# Patient Record
Sex: Female | Born: 1965
Health system: Southern US, Community
[De-identification: ages and names within clinical notes are randomized; demographics above are authoritative.]

## PROBLEM LIST (undated history)

## (undated) ENCOUNTER — Emergency Department (HOSPITAL_COMMUNITY): Admission: EM | Payer: Commercial Managed Care - PPO | Source: Home / Self Care

## (undated) DIAGNOSIS — E282 Polycystic ovarian syndrome: Secondary | ICD-10-CM

## (undated) DIAGNOSIS — F4310 Post-traumatic stress disorder, unspecified: Secondary | ICD-10-CM

## (undated) DIAGNOSIS — R609 Edema, unspecified: Secondary | ICD-10-CM

## (undated) DIAGNOSIS — G894 Chronic pain syndrome: Secondary | ICD-10-CM

## (undated) DIAGNOSIS — I7300 Raynaud's syndrome without gangrene: Secondary | ICD-10-CM

## (undated) DIAGNOSIS — G4734 Idiopathic sleep related nonobstructive alveolar hypoventilation: Secondary | ICD-10-CM

## (undated) DIAGNOSIS — R3129 Other microscopic hematuria: Secondary | ICD-10-CM

## (undated) DIAGNOSIS — K219 Gastro-esophageal reflux disease without esophagitis: Secondary | ICD-10-CM

## (undated) DIAGNOSIS — C20 Malignant neoplasm of rectum: Secondary | ICD-10-CM

## (undated) DIAGNOSIS — L299 Pruritus, unspecified: Secondary | ICD-10-CM

## (undated) DIAGNOSIS — F17200 Nicotine dependence, unspecified, uncomplicated: Secondary | ICD-10-CM

## (undated) DIAGNOSIS — F319 Bipolar disorder, unspecified: Secondary | ICD-10-CM

## (undated) DIAGNOSIS — J309 Allergic rhinitis, unspecified: Secondary | ICD-10-CM

## (undated) DIAGNOSIS — F411 Generalized anxiety disorder: Secondary | ICD-10-CM

## (undated) DIAGNOSIS — M797 Fibromyalgia: Secondary | ICD-10-CM

## (undated) DIAGNOSIS — I2510 Atherosclerotic heart disease of native coronary artery without angina pectoris: Secondary | ICD-10-CM

## (undated) DIAGNOSIS — M79662 Pain in left lower leg: Secondary | ICD-10-CM

## (undated) DIAGNOSIS — J411 Mucopurulent chronic bronchitis: Principal | ICD-10-CM

## (undated) DIAGNOSIS — L308 Other specified dermatitis: Secondary | ICD-10-CM

## (undated) DIAGNOSIS — Z8619 Personal history of other infectious and parasitic diseases: Secondary | ICD-10-CM

## (undated) DIAGNOSIS — R600 Localized edema: Secondary | ICD-10-CM

## (undated) DIAGNOSIS — I10 Essential (primary) hypertension: Secondary | ICD-10-CM

## (undated) DIAGNOSIS — M1990 Unspecified osteoarthritis, unspecified site: Secondary | ICD-10-CM

## (undated) DIAGNOSIS — R937 Abnormal findings on diagnostic imaging of other parts of musculoskeletal system: Secondary | ICD-10-CM

## (undated) DIAGNOSIS — F419 Anxiety disorder, unspecified: Secondary | ICD-10-CM

## (undated) DIAGNOSIS — D1803 Hemangioma of intra-abdominal structures: Secondary | ICD-10-CM

## (undated) DIAGNOSIS — K760 Fatty (change of) liver, not elsewhere classified: Secondary | ICD-10-CM

## (undated) DIAGNOSIS — R490 Dysphonia: Secondary | ICD-10-CM

## (undated) DIAGNOSIS — K589 Irritable bowel syndrome without diarrhea: Secondary | ICD-10-CM

## (undated) DIAGNOSIS — E662 Morbid (severe) obesity with alveolar hypoventilation: Secondary | ICD-10-CM

## (undated) DIAGNOSIS — J329 Chronic sinusitis, unspecified: Secondary | ICD-10-CM

## (undated) DIAGNOSIS — F329 Major depressive disorder, single episode, unspecified: Secondary | ICD-10-CM

## (undated) DIAGNOSIS — M79605 Pain in left leg: Secondary | ICD-10-CM

## (undated) DIAGNOSIS — D179 Benign lipomatous neoplasm, unspecified: Secondary | ICD-10-CM

## (undated) DIAGNOSIS — M79661 Pain in right lower leg: Secondary | ICD-10-CM

## (undated) DIAGNOSIS — M25511 Pain in right shoulder: Secondary | ICD-10-CM

## (undated) DIAGNOSIS — A0472 Enterocolitis due to Clostridium difficile, not specified as recurrent: Secondary | ICD-10-CM

## (undated) DIAGNOSIS — J449 Chronic obstructive pulmonary disease, unspecified: Secondary | ICD-10-CM

## (undated) DIAGNOSIS — M47816 Spondylosis without myelopathy or radiculopathy, lumbar region: Secondary | ICD-10-CM

## (undated) DIAGNOSIS — E559 Vitamin D deficiency, unspecified: Secondary | ICD-10-CM

## (undated) DIAGNOSIS — D509 Iron deficiency anemia, unspecified: Secondary | ICD-10-CM

## (undated) DIAGNOSIS — N281 Cyst of kidney, acquired: Secondary | ICD-10-CM

## (undated) DIAGNOSIS — R062 Wheezing: Secondary | ICD-10-CM

## (undated) DIAGNOSIS — E785 Hyperlipidemia, unspecified: Secondary | ICD-10-CM

## (undated) DIAGNOSIS — J45909 Unspecified asthma, uncomplicated: Secondary | ICD-10-CM

## (undated) DIAGNOSIS — G43909 Migraine, unspecified, not intractable, without status migrainosus: Secondary | ICD-10-CM

## (undated) DIAGNOSIS — R29898 Other symptoms and signs involving the musculoskeletal system: Secondary | ICD-10-CM

## (undated) DIAGNOSIS — F32A Depression, unspecified: Secondary | ICD-10-CM

## (undated) DIAGNOSIS — M79604 Pain in right leg: Secondary | ICD-10-CM

## (undated) HISTORY — DX: Unspecified asthma, uncomplicated: J45.909

## (undated) HISTORY — DX: Localized edema: R60.0

## (undated) HISTORY — PX: DILATION AND CURETTAGE OF UTERUS: SHX78

## (undated) HISTORY — DX: Depression, unspecified: F32.A

## (undated) HISTORY — DX: Mucopurulent chronic bronchitis: J41.1

## (undated) HISTORY — DX: Polycystic ovarian syndrome: E28.2

## (undated) HISTORY — DX: Hemangioma of intra-abdominal structures: D18.03

## (undated) HISTORY — DX: Wheezing: R06.2

## (undated) HISTORY — DX: Morbid (severe) obesity with alveolar hypoventilation: E66.2

## (undated) HISTORY — DX: Fatty (change of) liver, not elsewhere classified: K76.0

## (undated) HISTORY — DX: Enterocolitis due to Clostridium difficile, not specified as recurrent: A04.72

## (undated) HISTORY — DX: Pain in right shoulder: M25.511

## (undated) HISTORY — DX: Allergic rhinitis, unspecified: J30.9

## (undated) HISTORY — DX: Idiopathic sleep related nonobstructive alveolar hypoventilation: G47.34

## (undated) HISTORY — DX: Pain in right lower leg: M79.661

## (undated) HISTORY — DX: Vitamin D deficiency, unspecified: E55.9

## (undated) HISTORY — DX: Cyst of kidney, acquired: N28.1

## (undated) HISTORY — DX: Nicotine dependence, unspecified, uncomplicated: F17.200

## (undated) HISTORY — DX: Post-traumatic stress disorder, unspecified: F43.10

## (undated) HISTORY — DX: Personal history of other infectious and parasitic diseases: Z86.19

## (undated) HISTORY — DX: Pain in left leg: M79.605

## (undated) HISTORY — DX: Chronic pain syndrome: G89.4

## (undated) HISTORY — PX: CHOLECYSTECTOMY: SHX55

## (undated) HISTORY — DX: Pruritus, unspecified: L29.9

## (undated) HISTORY — DX: Other specified dermatitis: L30.8

## (undated) HISTORY — PX: TRANSTHORACIC ECHOCARDIOGRAM: SHX275

## (undated) HISTORY — DX: Benign lipomatous neoplasm, unspecified: D17.9

## (undated) HISTORY — DX: Fibromyalgia: M79.7

## (undated) HISTORY — DX: Generalized anxiety disorder: F41.1

## (undated) HISTORY — DX: Chronic obstructive pulmonary disease, unspecified: J44.9

## (undated) HISTORY — DX: Pain in right lower leg: M79.662

## (undated) HISTORY — DX: Unspecified osteoarthritis, unspecified site: M19.90

## (undated) HISTORY — PX: JOINT REPLACEMENT: SHX530

## (undated) HISTORY — DX: Edema, unspecified: R60.9

## (undated) HISTORY — DX: Raynaud's syndrome without gangrene: I73.00

## (undated) HISTORY — DX: Gastro-esophageal reflux disease without esophagitis: K21.9

## (undated) HISTORY — DX: Pain in right leg: M79.604

## (undated) HISTORY — DX: Irritable bowel syndrome, unspecified: K58.9

## (undated) HISTORY — DX: Essential (primary) hypertension: I10

## (undated) HISTORY — DX: Other microscopic hematuria: R31.29

## (undated) HISTORY — PX: OTHER SURGICAL HISTORY: SHX169

## (undated) HISTORY — DX: Iron deficiency anemia, unspecified: D50.9

## (undated) HISTORY — DX: Anxiety disorder, unspecified: F41.9

## (undated) HISTORY — DX: Dysphonia: R49.0

## (undated) HISTORY — DX: Other symptoms and signs involving the musculoskeletal system: R29.898

---
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
## 1999-03-27 DIAGNOSIS — A0472 Enterocolitis due to Clostridium difficile, not specified as recurrent: Secondary | ICD-10-CM

## 1999-03-27 HISTORY — DX: Enterocolitis due to Clostridium difficile, not specified as recurrent: A04.72

## 2002-05-18 ENCOUNTER — Ambulatory Visit (HOSPITAL_COMMUNITY): Admission: RE | Admit: 2002-05-18 | Discharge: 2002-05-18 | Payer: Self-pay | Admitting: Internal Medicine

## 2002-12-31 ENCOUNTER — Ambulatory Visit (HOSPITAL_BASED_OUTPATIENT_CLINIC_OR_DEPARTMENT_OTHER): Admission: RE | Admit: 2002-12-31 | Discharge: 2002-12-31 | Payer: Self-pay | Admitting: Obstetrics and Gynecology

## 2003-05-06 ENCOUNTER — Ambulatory Visit (HOSPITAL_COMMUNITY): Admission: RE | Admit: 2003-05-06 | Discharge: 2003-05-06 | Payer: Self-pay | Admitting: Family Medicine

## 2003-10-17 ENCOUNTER — Emergency Department (HOSPITAL_COMMUNITY): Admission: EM | Admit: 2003-10-17 | Discharge: 2003-10-17 | Payer: Self-pay | Admitting: Emergency Medicine

## 2006-07-18 ENCOUNTER — Ambulatory Visit (HOSPITAL_COMMUNITY): Admission: RE | Admit: 2006-07-18 | Discharge: 2006-07-18 | Payer: Self-pay | Admitting: Family Medicine

## 2007-05-03 ENCOUNTER — Emergency Department (HOSPITAL_COMMUNITY): Admission: EM | Admit: 2007-05-03 | Discharge: 2007-05-03 | Payer: Self-pay | Admitting: Emergency Medicine

## 2007-05-05 ENCOUNTER — Ambulatory Visit: Payer: Self-pay | Admitting: Internal Medicine

## 2007-05-05 ENCOUNTER — Encounter: Payer: Self-pay | Admitting: Internal Medicine

## 2007-05-05 ENCOUNTER — Ambulatory Visit (HOSPITAL_COMMUNITY): Admission: RE | Admit: 2007-05-05 | Discharge: 2007-05-05 | Payer: Self-pay | Admitting: Internal Medicine

## 2007-05-05 HISTORY — PX: COLONOSCOPY: SHX174

## 2007-10-27 ENCOUNTER — Ambulatory Visit (HOSPITAL_COMMUNITY): Admission: RE | Admit: 2007-10-27 | Discharge: 2007-10-27 | Payer: Self-pay | Admitting: Family Medicine

## 2007-11-18 ENCOUNTER — Ambulatory Visit: Payer: Self-pay | Admitting: Internal Medicine

## 2008-04-14 ENCOUNTER — Ambulatory Visit (HOSPITAL_COMMUNITY): Admission: RE | Admit: 2008-04-14 | Discharge: 2008-04-14 | Payer: Self-pay | Admitting: Family Medicine

## 2008-07-20 ENCOUNTER — Ambulatory Visit (HOSPITAL_COMMUNITY): Admission: RE | Admit: 2008-07-20 | Discharge: 2008-07-20 | Payer: Self-pay | Admitting: Unknown Physician Specialty

## 2008-11-04 ENCOUNTER — Telehealth (INDEPENDENT_AMBULATORY_CARE_PROVIDER_SITE_OTHER): Payer: Self-pay

## 2009-07-29 ENCOUNTER — Ambulatory Visit (HOSPITAL_COMMUNITY): Admission: RE | Admit: 2009-07-29 | Discharge: 2009-07-29 | Payer: Self-pay | Admitting: Family Medicine

## 2009-08-24 DIAGNOSIS — D1803 Hemangioma of intra-abdominal structures: Secondary | ICD-10-CM | POA: Insufficient documentation

## 2009-08-24 DIAGNOSIS — E282 Polycystic ovarian syndrome: Secondary | ICD-10-CM | POA: Insufficient documentation

## 2009-08-24 DIAGNOSIS — Z8719 Personal history of other diseases of the digestive system: Secondary | ICD-10-CM | POA: Insufficient documentation

## 2009-08-25 ENCOUNTER — Ambulatory Visit: Payer: Self-pay | Admitting: Internal Medicine

## 2009-08-25 DIAGNOSIS — K219 Gastro-esophageal reflux disease without esophagitis: Secondary | ICD-10-CM | POA: Insufficient documentation

## 2009-08-25 DIAGNOSIS — K589 Irritable bowel syndrome without diarrhea: Secondary | ICD-10-CM | POA: Insufficient documentation

## 2009-08-25 DIAGNOSIS — D012 Carcinoma in situ of rectum: Secondary | ICD-10-CM | POA: Insufficient documentation

## 2009-08-25 DIAGNOSIS — R197 Diarrhea, unspecified: Secondary | ICD-10-CM | POA: Insufficient documentation

## 2009-08-26 ENCOUNTER — Encounter: Payer: Self-pay | Admitting: Urgent Care

## 2009-08-27 ENCOUNTER — Encounter: Payer: Self-pay | Admitting: Urgent Care

## 2009-09-08 ENCOUNTER — Encounter: Payer: Self-pay | Admitting: Urgent Care

## 2009-10-12 ENCOUNTER — Ambulatory Visit: Payer: Self-pay | Admitting: Internal Medicine

## 2009-10-12 DIAGNOSIS — K5900 Constipation, unspecified: Secondary | ICD-10-CM | POA: Insufficient documentation

## 2009-10-21 ENCOUNTER — Telehealth (INDEPENDENT_AMBULATORY_CARE_PROVIDER_SITE_OTHER): Payer: Self-pay

## 2009-11-22 ENCOUNTER — Ambulatory Visit: Payer: Self-pay | Admitting: Internal Medicine

## 2010-03-15 ENCOUNTER — Encounter: Payer: Self-pay | Admitting: Gastroenterology

## 2010-04-25 ENCOUNTER — Encounter (INDEPENDENT_AMBULATORY_CARE_PROVIDER_SITE_OTHER): Payer: Self-pay | Admitting: *Deleted

## 2010-04-25 NOTE — Progress Notes (Signed)
  Phone Note Call from Patient   Caller: Patient Summary of Call: Pt called and has had some diarrhea and been treated by Dr. Milinda Cave. He did stool studies and treated her with antibiotics and she is still having some problems. She said this has been ongoing since the first of the month. I told her she should schedule an appt. She said she is going to call [facility] office and ask them to fax over the stool studies and send a referral. Initial call taken by: Cloria Spring LPN,  November 04, 2008 11:30 AM

## 2010-04-25 NOTE — Letter (Signed)
Summary: LABS  LABS   Imported By: Ave Filter 09/08/2009 13:40:39  _____________________________________________________________________  External Attachment:    Type:   Image     Comment:   External Document

## 2010-04-25 NOTE — Assessment & Plan Note (Signed)
Summary: FU WITH RMR IN 3-4 WKS WITH RMR ONLY/SS   Visit Type:  Follow-up Visit Primary Care Provider:  [facility]  Chief Complaint:  f/u.  History of Present Illness:  45 year old lady with a recent bouts of constipation. She has had intermittent diarrhea in the past and has had Clostridium difficile and Salmonella infections in the past. More recently ( last several months) she has been constipated.  Infrequently gets the urge; no rectal bleeding. She has a history of having carcinoma in situ a rectal polyp removed by me several years ago. She'll be due for followup colonoscopy in 2014.  Also,  has a first-degree relative (dad) with a the history of colorectal carcinoma at a young age. She does take stool softeners at bedtime. She really doesn't take any laxatives on a regular basis.  Her reflux symptoms are well controlled on Nexium 40 mg orally once daily.  Patient sites a lot of stress in her life recently. Her son is bipolar and ADHD.      Allergies (verified): 1)  ! 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 1/2ppd now, 16pkyr history Alcohol Use - no Illicit Drug Use - no  Risk Factors: Smoking Status: current (08/25/2009)  Vital Signs:  Patient profile:   45 year old female Height:      61 inches Weight:      264 pounds BMI:     50.06 Temp:     72 degrees F oral Pulse rate:   72 / minute BP sitting:   118 / 78   (left arm) Cuff size:   large  Vitals Entered By: Cloria Spring LPN (October 12, 2009 11:23 AM)  Physical Exam  General:  alert conversant no acute distress Eyes:  no scleral icterus. Conjunctiva are pink Lungs:  clear to auscultation Heart:  regular rate and rhythm without murmur gallop rub Abdomen:  obese. Positive bowel sounds soft and entirely nontender without appreciable mass or organomegaly. Rectal:  good sphincter time. No mass rectal vault. Scant brown stool. Hemoccult negative.  Impression & Recommendations: Impression: Pleasant 45 year old lady with recent significant constipation. No alarm features.   Hemoccult negative. Polypharmacy may be contributing to diminished colon motility.  Recommendations: Begin MiraLax 17 g orally daily at bedtime p.r.n. no bowel movement on any  given day.  Continue stool softener therapy.  Followup appointment here in 4-6 weeks.  At this time, no plans to repeat colonoscopy prior to 2014.  Appended Document: Orders Update    Clinical Lists Changes  Problems: Added new problem of CONSTIPATION (ICD-564.00) Orders: Added new Service order of Est. Patient Level IV (04540) - Signed Added new Service order of Hemoccult Guaiac-1 spec.(in office) (82270) - Signed      Appended Document: FU WITH RMR IN 3-4 WKS WITH RMR ONLY/SS pt aware of appt on 11/22/09 @ 0830 w/RMR

## 2010-04-25 NOTE — Assessment & Plan Note (Signed)
Summary: FU OV IN 6 WEEKS,CONSTIPATION,GERD/SS   Visit Type:  Follow-up Visit Primary Care Provider:  [facility]  Chief Complaint:  F/U constipation/gerd.  History of Present Illness: Followup irritable  bowel syndrome - now reverted back to diarrhea which is her predominant symptom historically.  4-6 bowel movements daily. No blood per rectum. She tells me she feels more comfortable with diarrhea than with constipation. Reflux symptoms well-controlled on Nexium 40 mg orally daily. She'll be due for surveillance colonoscopy 2014.  C. difficile toxin assay and culture on her stool came back negative in June of this year. If she has significant diarrhea she takes  Levsin sublingually. She does take yougatr daily. She did not like taking align.  Hemoccult-Negative on DRE previously.    Current Medications (verified): 1)  Fexofenadine Hcl 180 Mg Tabs (Fexofenadine Hcl) .... Once Daily 2)  Sertraline Hcl 100 Mg Tabs (Sertraline Hcl) .... Take 2 At Bedtime 3)  Singulair 10 Mg Tabs (Montelukast Sodium) .... At Bedtime 4)  Clonazepam 1 Mg Tabs (Clonazepam) .... 2 At Bedtime 5)  Nexium 40 Mg Cpdr (Esomeprazole Magnesium) .Marland Kitchen.. 1 By Mouth Daily For Acid Reflux 6)  Rozerem 8 Mg Tabs (Ramelteon) .... As Needed 7)  Promethazine Hcl 25 Mg Tabs (Promethazine Hcl) .... As Needed 8)  Imitrex 100 Mg Tabs (Sumatriptan Succinate) .... As Needed 9)  Wellbutrin Sr 150 Mg Xr12h-Tab (Bupropion Hcl) .... One Tablet Daily 10)  Otc Allergy .... Take 1 Tablet By Mouth Once A Day  Allergies (verified): 1)  ! 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 1/2ppd now, 16pkyr history Alcohol Use - no Illicit Drug Use - no  Risk Factors: Smoking Status: current (08/25/2009)  Vital Signs:  Patient profile:   45 year old female Height:      61 inches Weight:      260 pounds BMI:     49.30 Temp:     97.9 degrees F oral Pulse rate:   80 / minute BP sitting:   100 / 80  (left arm) Cuff size:   large  Vitals Entered By: Cloria Spring LPN (November 22, 2009 8:49 AM)  Physical Exam  General:  alert conversant lady in no acute distress. Abdomen:  obese. Positive bowel sounds soft nontender without appreciable mass or organomegaly  Impression & Recommendations: Impression: Alternating constipation diarrhea with a predominance of diarrhea -   most consistent with diarrhea predominant irritable  bowel syndrome. History of high-grade rectal adenoma.  GERD symptoms well-controlled.  Recommendations: Continue Nexium 40 mg orally daily  Continue yogurt on a regular basis.   Would use Imodium primarily prn - levsin as a back-up.  One ifobt on stool.  Colonoscopy 2014.  Office visit here 6 months.  Appended Document: Orders Update    Clinical Lists Changes  Orders: Added new Service order of Est. Patient Level IV (04540) - Signed

## 2010-04-25 NOTE — Medication Information (Signed)
Summary: RX Folder  RX Folder   Imported By: Peggyann Shoals 08/26/2009 15:23:39  _____________________________________________________________________  External Attachment:    Type:   Image     Comment:   External Document  Appended Document: RX Folder Please let pharmacy know ok to substitute.  thanks  Appended Document: RX Folder Pt informed. She wants to know is it OK to start the Levsin now? Also, had few samples of Nexium, but needs Rx to [facility]. Wants to know how long to continue Align?  Appended Document: RX FolderNEXIUM  Cotinue Align for at least 3 months.  Can begin Levsin.  OV in 4 weeks w/ RMR or call sooner if diarrhea persists.  Thanks  Prescriptions: NEXIUM 40 MG CPDR (ESOMEPRAZOLE MAGNESIUM) 1 by mouth daily for acid reflux  #31 x 5   Entered and Authorized by:   Joselyn Arrow FNP-BC   Signed by:   Joselyn Arrow FNP-BC on 08/29/2009   Method used:   Electronically to        [facility]  [address] (retail)       [address]       [place], [place]  [address]       Ph: [phone]       Fax: [phone]   RxID:   [id number]     Appended Document: RX Folder Pt infomed of the above.  Appended Document: RX Folder Pt has appt w/RMR on 7/20 at 1130. (pt aware to be here at 11am)

## 2010-04-25 NOTE — Progress Notes (Signed)
Summary: nexium samples  Phone Note Outgoing Call   Summary of Call: tried to  call pt- LMOM. Nexium samples came in. per RMR request at OV. #15 samples left at front desk for pt to pick up.  Initial call taken by: Hendricks Limes LPN,  October 21, 2009 9:37 AM

## 2010-04-25 NOTE — Assessment & Plan Note (Signed)
Summary: NAUSEA,DIARRHEA/STOMACH CRAMPING/SS   Visit Type:  Follow-up Visit Referring Provider:  [facility] Primary Care Provider:  Dr Marvel Plan  Chief Complaint:  nausea, diarrhea, and severe abd cramping.  History of Present Illness: 45 y/o caucasian female presents w/ change in bowels, hx IBS.  Dx w/ Salmonella in Aug 2010 and was treated w/ antibiotics.  c/o fatigue, malaise, diarrhea w/ 6-7 loose watery stools/day & stomach cramps.  "Always had diarrhea."  But has had swings 1-2 hard stools per day lately.  Feels pale, nausea, & had episode of "feeling like passing out" 3 days ago w/ stomach cramps.  Had labs about 2 weeks ago.  Denies weight loss.  c/o anorexia.   Last round abx for sinusitis/otitis 1 month ago.  c/o heartburn & indigestion w/ chest pain.  Taking ranitidine which helps.  Lots of family stress lately.  Carrying epi pain due to hives.  Labs requested Dr Dietrich Pates office.  Current Problems (verified): 1)  Irritable Bowel Syndrome  (ICD-564.1) 2)  Diarrhea, Chronic  (ICD-787.91) 3)  Gerd  (ICD-530.81) 4)  Hx of Carcinoma in Situ of Rectum  (ICD-230.4) 5)  Clostridium Difficile Colitis, Hx of  (ICD-V12.79) 6)  Hemangioma, Hepatic  (ICD-228.04) 7)  Polycystic Ovarian Disease  (ICD-256.4) 8)  Fh of Colon Cancer  (ICD-153.9) 9)  Rectal Bleeding, Hx of  (ICD-V12.79)  Current Medications (verified): 1)  Fexofenadine Hcl 180 Mg Tabs (Fexofenadine Hcl) .... Once Daily 2)  Sertraline Hcl 50 Mg Tabs (Sertraline Hcl) .... Take 2 At Bedtime 3)  Singulair 10 Mg Tabs (Montelukast Sodium) .... At Bedtime 4)  Clonazepam 1 Mg Tabs (Clonazepam) .... 2 At Bedtime 5)  Ranitidine Hcl 150 Mg Caps (Ranitidine Hcl) .... At Bedtime 6)  Rozerem 8 Mg Tabs (Ramelteon) .... As Needed 7)  Promethazine Hcl 25 Mg Tabs (Promethazine Hcl) .... As Needed 8)  Imitrex 100 Mg Tabs (Sumatriptan Succinate) .... As Needed 9)  Levsin/sl 0.125 Mg Subl (Hyoscyamine Sulfate) .Marland Kitchen.. 1 By Mouth Ac/hs (Up To Qid) As  Needed Diarrhea/abd Pain  Allergies (verified): 1)  ! 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 1/2ppd now, 16pkyr history Alcohol Use - no Illicit Drug Use - no Smoking Status:  current Drug Use:  no  Review of Systems General:  Complains of chills, sweats, anorexia, fatigue, weakness, and malaise; denies fever, weight loss, and sleep disorder. CV:  Denies chest pains, angina, palpitations, syncope, dyspnea on exertion, orthopnea, PND, peripheral edema, and claudication. Resp:  Denies dyspnea at rest, dyspnea with exercise, cough, sputum, wheezing, coughing up blood, and pleurisy. GI:  Denies jaundice, bloody BM's, and black BMs. GU:  Denies urinary burning, blood in urine, nocturnal urination, urinary frequency, and urinary incontinence. Derm:  Complains of hives; denies rash, itching, dry skin, moles, warts, and unhealing ulcers; recent, resolved. Psych:  Complains of anxiety; denies depression, memory loss, suicidal ideation, hallucinations, paranoia, phobia, and confusion. Heme:  Denies bruising, bleeding, and enlarged lymph nodes.  Vital Signs:  Patient profile:   45 year old female Height:      61 inches Weight:      259 pounds BMI:     49.11 Temp:  97.7 degrees F oral Pulse rate:   88 / minute BP sitting:   118 / 78  (left arm) Cuff size:   large  Vitals Entered By: Hendricks Limes LPN (August 26, 7827 9:09 AM)  Physical Exam  General:  obese.  Well developed, no acute distress. Head:  Normocephalic and  atraumatic. Eyes:  Sclera clear, no icterus. Ears:  Normal auditory acuity. Nose:  No deformity, discharge,  or lesions. Mouth:  No deformity or lesions, dentition normal. Neck:  Supple; no masses or thyromegaly. Lungs:  Clear throughout to auscultation. Heart:  Regular rate and rhythm; no murmurs, rubs,  or bruits. Abdomen:  Soft, nontender and nondistended. No masses, hepatosplenomegaly or hernias noted. Normal bowel sounds.without guarding and without rebound.   Msk:  Symmetrical with no gross deformities. Normal posture. Pulses:  Normal pulses noted. Extremities:  No clubbing, cyanosis, edema or deformities noted. Neurologic:  Alert and  oriented x4;  grossly normal neurologically. Skin:  Intact without significant lesions or rashes. Cervical Nodes:  No significant cervical adenopathy. Psych:  Alert and cooperative. Normal mood and affect.  Impression & Recommendations:  Problem # 1:  DIARRHEA, CHRONIC (ICD-787.91) 45 y/o obese caucasian female w/ chronic diarrhea, change in bowels, hx remote c diff & salmonella last year.  Differentials include post-infectious IBS flare, recurrent c diff, celiac disease, microscopic colitis, post-cholecystectomy diarrhea or less likely bacterial infection, colorectal CA, or lymphoma.    Will review labs Dr Alexia Freestone  Orders: T-Culture, C-Diff Toxin A/B [phone]) T-Culture, C-Diff Toxin A/B [phone]) if 1st negative T-Culture, Stool ([id number]) Est. Patient Level IV (41324)  Problem # 2:  IRRITABLE BOWEL SYNDROME (ICD-564.1) See #!  Problem # 3:  Hx of CARCINOMA IN SITU OF RECTUM (ICD-230.4) Assessment: Comment Only  Problem # 4:  GERD (ICD-530.81) Needs trial PPI .  Patient Instructions: 1)  Samples Align given 2)  Nexium samples 40mg  daily x 2 boxes given 3)  Begin Levsin as needed for diarrhea/pain once stools turned in 4)  discontinue ranitidine Prescriptions: LEVSIN/SL 0.125 MG SUBL (HYOSCYAMINE SULFATE) 1 by  mouth ac/hs (up to QID) as needed diarrhea/abd pain  #90 x 0   Entered and Authorized by:   Joselyn Arrow FNP-BC   Signed by:   Joselyn Arrow FNP-BC on 08/25/2009   Method used:   Electronically to        [facility]  [address] (retail)       [address]       [place], [place]  [address]       Ph: [phone]       Fax: [phone]   RxID:   [id number]

## 2010-04-27 NOTE — Medication Information (Signed)
Summary: NEXIUM 40MG   NEXIUM 40MG    Imported By: Rexene Alberts 03/15/2010 09:02:21  _____________________________________________________________________  External Attachment:    Type:   Image     Comment:   External Document  Appended Document: NEXIUM 40MG     Prescriptions: NEXIUM 40 MG CPDR (ESOMEPRAZOLE MAGNESIUM) 1 by mouth daily for acid reflux  #31 x 5   Entered and Authorized by:   Gerrit Halls NP   Signed by:   Gerrit Halls NP on 03/15/2010   Method used:   Faxed to ...       [facility]  [address] (retail)       [address]       [place], [place]  [address]       Ph: [phone]       Fax: [phone]   RxID:   [id number]

## 2010-04-28 ENCOUNTER — Other Ambulatory Visit (HOSPITAL_COMMUNITY): Payer: Self-pay | Admitting: Pediatrics

## 2010-04-28 DIAGNOSIS — Z139 Encounter for screening, unspecified: Secondary | ICD-10-CM

## 2010-05-03 NOTE — Letter (Signed)
Summary: Recall Office Visit  [facility]  [address]   [place], [place] [address]   Phone: [phone]  Fax: [phone]      April 25, 2010   EISHA CHATTERJEE [address] [place], [place]  [address] 11-28-1965   Dear Ms. Bard,   According to our records, it is time for you to schedule a follow-up office visit with [place].   At your convenience, please call [phone] to schedule an office visit. If you have any questions, concerns, or feel that this letter is in error, we would appreciate your call.   Sincerely,    Diana Eves  [facility] Ph: [phone]   Fax: [phone]

## 2010-05-08 ENCOUNTER — Ambulatory Visit (HOSPITAL_COMMUNITY)
Admission: RE | Admit: 2010-05-08 | Discharge: 2010-05-08 | Disposition: A | Discharge status: Home / Self Care | Payer: PRIVATE HEALTH INSURANCE | Source: Ambulatory Visit | Attending: Pediatrics | Admitting: Pediatrics

## 2010-05-08 DIAGNOSIS — Z1231 Encounter for screening mammogram for malignant neoplasm of breast: Secondary | ICD-10-CM | POA: Insufficient documentation

## 2010-05-08 DIAGNOSIS — Z139 Encounter for screening, unspecified: Secondary | ICD-10-CM

## 2010-08-08 NOTE — Op Note (Signed)
Martha Espinoza, Martha Espinoza               ACCOUNT NO.:  [id number]   MEDICAL RECORD NO.:  [id number]          PATIENT TYPE:  AMB   LOCATION:  DAY                           FACILITY:  [facility]   PHYSICIAN:  R. Roetta Sessions, M.D. DATE OF BIRTH:  1965/07/01   DATE OF PROCEDURE:  05/05/2007  DATE OF DISCHARGE:                               OPERATIVE REPORT   PROCEDURE:  Ileocolonoscopy with snare polypectomy.   ENDOSCOPIST:  Jonathon Bellows, M.D.   INDICATIONS FOR PROCEDURE:  [age]-year-old lady with a positive  family history of colon cancer in first-degree relatives at a young age  as well as a personal history of large polyp with carcinoma in situ  removed from her rectum when she was 48.  She had a followup colonoscopy  3 years later without any evidence of recurrent polyp or neoplasm.  She  now comes for a 5-year followup.  She is devoid of any lower GI tract  symptoms.  Colonoscopy has been reviewed.  Risks, benefits, alternatives  and limitations have been discussed and questions answered.  She is  agreeable.  Please see documentation in the medical record.   PROCEDURE NOTE:  O2 saturation, blood pressure, pulse and respirations  were monitored throughout the entire procedure.   CONSCIOUS SEDATION:  Versed 5 mg IV, fentanyl 100 mcg IV in divided  doses.   INSTRUMENT:  Pentax video chip system.   FINDINGS:  Digital rectal exam revealed no abnormalities.   ENDOSCOPIC FINDINGS:  Prep was good.   COLON:  Colonic mucosa was surveyed from the rectosigmoid junction  through the left, transverse and right colon to the area of the  appendiceal orifice, ileocecal valve and cecum.  These structures were  well seen and photographed for the record.  Terminal ileum was intubated  at 5 cm.  From this level, the scope was slowly withdrawn and all  previously mentioned mucosal surfaces were again seen.  The colonic  mucosa appeared normal except two 50mm polyps, 1 on the ileocecal valve  and 1 in the splenic flexure; both of these lesions were cold-snare  removed.  The remainder of the colonic mucosa again appeared entirely  normal, as did the terminal ileum mucosa.  Scope was pulled down into  the rectum, where a thorough examination of the rectal mucosa including  a retroflex view of the anal verge demonstrated no abnormalities.  The  patient tolerated the procedure well and was reactive after endoscopy.   IMPRESSION:  1. Normal rectum.  2. Polyps at the splenic flexure and on the ileocecal valve, removed,      as described above; remainder of the colonic mucosa appeared      normal.  Normal terminal ileum.   RECOMMENDATIONS:  1. Follow up on pathology.  2. Further recommendations to follow.      Jonathon Bellows, M.D.  Electronically Signed     RMR/MEDQ  D:  05/05/2007  T:  05/06/2007  Job:  [id number]   cc:   Francoise Schaumann. Milford Cage DO, FAAP  Fax: [phone]

## 2010-08-08 NOTE — Assessment & Plan Note (Signed)
NAMEMarland Kitchen  Martha, Espinoza                CHART#:  [id number]   DATE:  11/18/2007                       DOB:  07-09-1965   PRIMARY CARE PHYSICIAN:  Dr. Milford Cage.   CHIEF COMPLAINT:  Rectal bleeding.   PROBLEM LIST:  1. Carcinoma in situ removed from rectum at age 45.  2. Family history of colon cancer.  3. Last colonoscopy by Dr. Jena Gauss on May 05, 2007, tubular adenoma      with melanosis coli at the splenic flexure removed and polyp on the      ileocecal valve removed, which was benign.  4. Polycystic ovarian syndrome.  5. Clostridium difficile colitis 2001.  6. Hepatic hemangioma.  7. Status post cholecystectomy.   SUBJECTIVE:  The patient is a 45 year old Caucasian female.  She has  noticed a large amount of bright red blood in her stool which occurred  after diarrhea all day 2 days ago.  She then noticed blood with wiping  on the toilet paper.  She has not had any further bleeding yesterday or  today.  She denies abdominal pain.  She does have history of vaginal  bleeding.  She recently underwent D and C for this.  She also tells me  she had microscopic hematuria.  She complains of loose stools  approximately 6 per day.  She has been under a lot of stress recently  and feels this may be worsening her diarrhea.  She denies any abdominal  pain.  Denies any pruritus or proctalgia.   CURRENT MEDICATIONS:  See the list from October 29, 2007.   ALLERGIES:  Codeine.   OBJECTIVE:  VITAL SIGNS:  Weight 258, height 61 inches, temperature  98.4, blood pressure 118/84, and pulse 64.  GENERAL:  The patient is a 45 year old Caucasian female, who is obese.  She is alert, oriented, pleasant, and cooperative in no acute distress.  HEENT:  Sclerae clear, nonicteric.  Conjunctivae pink.  Oropharynx pink  and moist without any lesions.  CHEST:  Heart regular rate and rhythm.  Normal S1 and S2.  ABDOMEN:  Protuberant with positive bowel sounds x4.  No bruits  auscultated.  Soft, nontender,  nondistended without palpable mass or  hepatosplenomegaly.  No rigidity or guarding.  RECTAL:  No external lesions visualized.  She does have a small internal  hemorrhoid which is palpable, nontender, not actively bleeding, small  amount of stool was obtained from vault which was medium brown and  Hemoccult negative.   ASSESSMENT:  The patient is a 45 year old Caucasian female, who has had  a 24-hour history of bloody diarrhea.  She has not seen any bleeding in  the last 2 days.  I suspect she may have a fissure as the culprit of her  bleeding.  It is reassuring that she had a colonoscopy earlier this  year.  She did not appear to have recurrent Clostridium difficile at  this time.  She may have some underlying irritable bowel syndrome with  her diarrhea.  We will need to continue to monitor her and see how she  does.   PLAN:  1. Anusol-HC suppositories one per rectum b.i.d., #20 with no refills.  2. CBC today and we will request recent lab workup to Dr. Webb Laws      office.  3. Levsin 0.125 mg a.c. and h.s. q.i.d. p.r.n.  diarrhea, #60 with one      refill.       Lorenza Burton, N.P.  Electronically Signed     R. Roetta Sessions, M.D.  Electronically Signed    KJ/MEDQ  D:  11/18/2007  T:  11/19/2007  Job:  [id number]   cc:   Dr. Milford Cage

## 2010-08-11 NOTE — H&P (Signed)
Martha Espinoza, Martha Espinoza                           ACCOUNT NO.:  [id number]   MEDICAL RECORD NO.:  [id number]                  PATIENT TYPE:   LOCATION:                                       FACILITY:   PHYSICIAN:  R. Roetta Sessions, M.D.              DATE OF BIRTH:  [birth year]   DATE OF ADMISSION:  04/23/2002  DATE OF DISCHARGE:                                HISTORY & PHYSICAL   CHIEF COMPLAINT:  Diarrhea and need for surveillance colonoscopy.   HISTORY OF PRESENT ILLNESS:  The patient is a pleasant 45 year old lady  followed primarily by Dr. Acey Espinoza who comes in for a followup.  This lady  had a history of hematochezia.  She underwent a colonoscopy in 2001 and was  found to have a large rectal polyp and multiple other polyps throughout her  colon.  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  She had one other  adenomatous polyp at the splenic flexure, as well as hypoplastic and  inflammatory polyps in the colon which were removed.  A sigmoidoscopy  several weeks later demonstrated scar in the rectum.  It was rebiopsied and  there was no evidence of neoplasia.  She has done well from her standpoint  of bleeding; however, she has had problems with diarrhea for the past couple  of years.  She tells me she has 6-8 bowel movements daily, particularly  after she eats.  She really says she never has a formed bowel movement but  is not awakened from a sound sleep to have a bowel movement.  Again, she has  not passed any more blood per rectum.  No melena.  She has not had any  abdominal pain.  The patient tells me that she was treated with Biaxin for a  sinus infection back in November/December 2003.   PAST MEDICAL HISTORY:  She has history of polycystic ovarian syndrome.  She  is on metformin.  She is trying to conceive.  She just recently started  having periods once again.  She is two days late.  This  lady's past GI  history is also significant for C. difficile associated diarrhea, which was  treated back in 2001.  History of recurrent ear infections.  She has a  history of hepatic hemangiomas confirmed with MRI worked up by Dr. Milford Espinoza  previously.   PAST SURGICAL HISTORY:  D&C, cholecystectomy, colonoscopy, snare polypectomy  as described above.   FAMILY HISTORY:  Significant in that her father was diagnosed with  colorectal cancer at age 73.  Mother with hypertension.  No other GI/liver  history.   CURRENT MEDICATIONS:  1. Prolex 10/60 mg p.r.n.  2. Metformin 500 mg 2 b.i.d.   ALLERGIES:  CODEINE, nausea and vomiting.  She tells me she had nausea and  vomiting following her colonoscopy, for which she received  DEMEROL and  VERSED.   SOCIAL HISTORY:  The patient has been married for 17 years, has one child,  and employed with [employer].  She smokes less than half a pack per  day.  No alcohol.   REVIEW OF SYSTEMS:  No chest pain, no dyspnea, no fever or chills.   PHYSICAL EXAMINATION:  GENERAL:  A pleasant 45 year old lady resting  comfortably.  VITAL SIGNS:  Weight 232 pounds, height 5 feet 1 inch, temperature 98, blood  pressure 140/90, pulse 98.  SKIN:  Warm and dry.  No jaundice.  No cutaneous stigmata of chronic liver  disease.  HEENT:  No scleral icterus.  Conjunctivae are pink.  Oral cavity:  No  lesions.  JVD is not prominent.  CHEST:  Lungs are clear to auscultation.  CARDIAC:  Regular rate and rhythm without murmur, gallop, or rub.  BREASTS:  Deferred.  ABDOMEN:  Obese.  Positive bowel sounds.  Soft without appreciable mass or  organomegaly.  Abdomen is nontender.  EXTREMITIES:  No edema.  RECTAL:  Exam is deferred until time of colonoscopy.   IMPRESSION:  The patient was a 45 year old lady with a history of colonic  polyps with carcinoma in situ in a large rectal polyp removed in 2001.  She  needs to have her surveillance examination this year.   Diarrhea appears to  be a big problem for her now but it has been chronic and ongoing for the  better part of two years.   She did have a history of Clostridium difficile infection previously.  Her  symptoms do sound a lot like irritable bowel syndrome; however, typically  irritable bowel syndrome patients do have periods of normal bowel function  interspersed between periods of diarrhea.  I think it would be a good idea  to send off a Clostridium difficile toxin assay, particularly in view of her  history of recent antibiotics just to make sure that is not an issue at this  time.   RECOMMENDATIONS:  1. She needs a urine pregnancy test today prior to embarking on colonoscopy.     If it were positive, we would change that approach.  2. We will go ahead and check a Clostridium difficile now.  If that is     positive, we will go ahead and treat and hold off on colonoscopy.  If     urine pregnancy and Clostridium difficile are both negative, we will     proceed with a colonoscopy in the near future.  I have discussed this     approach with the patient.  Potential risks, benefits, and alternatives     have been reviewed.  Questions answered.  She is agreeable.  ASA II.  We     will proceed with the __________ as outlined above.                                               Jonathon Bellows, M.D.    RMR/MEDQ  D:  04/23/2002  T:  04/23/2002  Job:  [id number]   cc:   Francoise Schaumann. Halm, D.O.  [address], [address]  [place]  [place] [address]  Fax: [phone]

## 2010-08-11 NOTE — Op Note (Signed)
Martha Espinoza, Martha Espinoza                           ACCOUNT NO.:  [id number]   MEDICAL RECORD NO.:  [id number]                   PATIENT TYPE:  AMB   LOCATION:  DAY                                  FACILITY:  [facility]   PHYSICIAN:  R. Roetta Sessions, M.D.              DATE OF BIRTH:  Nov 03, 1965   DATE OF PROCEDURE:  05/18/2002  DATE OF DISCHARGE:                                 OPERATIVE REPORT   PROCEDURE:  Surveillance colonoscopy.   INDICATION FOR PROCEDURE:  The patient is a 45 year old lady who underwent a  colonoscopy back in 2001 for rectal bleeding.  She was found to have a large  rectal polyp, which was removed and contained carcinoma in situ without  invasion.  She has done well since that time except for intermittent  diarrhea.  She occasionally has a formed stool.  She was started on  metformin because of polycystic ovarian syndrome and was told that this  would probably cause some diarrhea.  She has not had any blood per rectum  except for last night when she was in the process of taking her GoLYTELY  prep.  Urine pregnancy and C. difficile toxin assay were negative through my  office recently.  Colonoscopy is now being done to surveil her colon and  rectum.  This approach has been discussed with the patient.  The potential  risks, benefits, and alternatives have been reviewed.  Please see my  dictated H&P for more information.   MONITORING:  O2 saturation, blood pressure, pulse, and respirations were  monitored throughout the entirety of the procedure.   ANESTHESIA:  Conscious sedation with Versed 4 mg IV and fentanyl 75 mcg IV  in divided doses.   INSTRUMENT USED:  Olympus video chip adult colonoscope.   FINDINGS:  Digital rectal examination revealed no abnormalities.   Endoscopic findings:  Prep was good.   Rectum:  Examination of the rectal mucosa including retroflexed view of the  anal verge revealed no abnormalities.   Colon:  The colonic mucosa was surveyed from  the rectosigmoid junction  through the left, transverse, and right colon to the area of the appendiceal  orifice, ileocecal valve, and cecum.  These structures were well-seen and  photographed for the record.  The colonic mucosa to the cecum appeared  normal.  The terminal ileum was intubated to 10 cm.  This segment of the GI  tract also appeared normal.  From this level the scope was slowly withdrawn.  All previously-mentioned mucosal surfaces were again seen, and again no  abnormalities were observed.  The stool residue was suctioned for  microbiology studies.  The patient tolerated the procedure well and was  reactive in endoscopy.   IMPRESSION:  1. Normal rectum.  2. Normal colon.  3. Normal terminal ileum.   RECOMMENDATIONS:  1. As far as surveillance is concerned, the patient should return for a  repeat colonoscopy in 10 years.  2. Follow up on stool studies.  3. Further recommendations to follow.   The patient's diarrhea may be secondary to irritable bowel syndrome with a  contribution from medication effect.  Will review stool studies and make  further recommendations.                                                Jonathon Bellows, M.D.    RMR/MEDQ  D:  05/18/2002  T:  05/18/2002  Job:  [id number]   cc:   Francoise Schaumann. Halm, D.O.  [address], [address]  [place]  [place] [address]  Fax: [phone]

## 2010-08-11 NOTE — Op Note (Signed)
NAME:  Martha Espinoza, GROLEAU                         ACCOUNT NO.:  [id number]   MEDICAL RECORD NO.:  [id number]                   PATIENT TYPE:  AMB   LOCATION:  [facility]                                 FACILITY:  [facility]   PHYSICIAN:  Daniel L. Eda Paschal, M.D.           DATE OF BIRTH:  [birth year]   DATE OF PROCEDURE:  12/31/2002  DATE OF DISCHARGE:                                 OPERATIVE REPORT   PREOPERATIVE DIAGNOSIS:  Chronic left lower quadrant pain with pelvic  inflammatory disease suspected.   POSTOPERATIVE DIAGNOSIS:  Bilateral pelvic adhesions with pelvic pain.   OPERATION:  Laparoscopy with lysis of pelvic adhesions and chromotubation.   SURGEON:  Daniel L. Eda Paschal, M.D.   ANESTHESIA:  General endotracheal.   INDICATIONS:  The patient is a 45 year old female, who has had  fairly long  history of left lower quadrant pain.  She had a previous HSG done because of  infertility that showed tubal obstruction and is a result of the combination  of the both.  She now enters the hospital for laparoscopy with chronic  pelvic inflammatory disease suspected.  She has given me permission to  remove the left ovary and tube if that is appropriate for the above.   FINDINGS:  At the time of laparoscopy, the patient had normal uterus.  She  had normal fallopian tubes with luxuriant fimbria.  Bilaterally, the tubes  were patent when indigo carmine was introduced.  She did have adhesive  disease involving both ovaries and the peritoneum.  On the left, the ovary  was adherent to the mesentery of the large bowel and on the right, it was  adherent posteriorly to the broad ligament.  The patient had a previous  perirectal procedure done for colon cancer, and it was felt that possibly  these adhesions were related to that, as she did not really have any sign of  salpingitis, and she also did not have any sign of endometriosis.  Both the  patient's ovaries did look polycystic which was consistent  with her previous  diagnosis made without any neoplastic growths on either ovary.   DESCRIPTION OF PROCEDURE:  After adequate general endotracheal anesthesia,  the patient was placed in the dorsal lithotomy position and prepped and  draped in the usual sterile manner.  Her bladder was emptied with a Robinson  catheter.  A ZUMI was introduced into the uterus to allow chromotubation to  be done.  The Voorhees needle was introduced subumbilically.  Three and a  half liters of carbon dioxide were introduced, and a pneumoperitoneum was  created.  A subumbilical larger incision was made and through that, a 10 mm  trocar was placed and through that, the operating laparoscope was placed.  A  5 mm port was placed in the pelvis in the suprapubic area.  The pelvis was  visualized and was as noted above.  When dye was introduced through the  ZUMI, both tubes spilled dye without any difficulty, so she had bilateral  tubal patency.  Using a grasping instrument to put the ovary on stretch,  first adhesions involving the left ovary and mesentery of the sigmoid colon  were sharply lysed without causing any bleeding.  The procedure was repeated  on the right and once again, all the adhesions were lysed.  When the  procedure was terminated, both ovaries were completely free without any  adherence to the surrounding structures.  The procedure was therefore  terminated.  The trocars were removed.  The  pneumoperitoneum was evacuated.  The subumbilical incision was closed with 0  Vicryl, and the skin was closed with staples.  Estimated blood loss for the  entire procedure was 30 mL with none replaced.  The patient tolerated the  procedure well and left the operating room in satisfactory condition.                                               Daniel L. Eda Paschal, M.D.    Tonette Bihari  D:  12/31/2002  T:  12/31/2002  Job:  [id number]

## 2010-09-19 ENCOUNTER — Other Ambulatory Visit: Payer: Self-pay | Admitting: Urgent Care

## 2010-09-21 NOTE — Telephone Encounter (Signed)
Needs office visit with [place]. Will refill X 1.

## 2010-11-14 ENCOUNTER — Ambulatory Visit: Payer: PRIVATE HEALTH INSURANCE | Admitting: Urgent Care

## 2010-11-14 ENCOUNTER — Encounter: Payer: Self-pay | Admitting: Urgent Care

## 2010-11-14 ENCOUNTER — Ambulatory Visit (INDEPENDENT_AMBULATORY_CARE_PROVIDER_SITE_OTHER): Payer: BC Managed Care – PPO | Admitting: Urgent Care

## 2010-11-14 VITALS — BP 124/82 | HR 105 | Temp 98.5°F | Ht 61.0 in | Wt 256.6 lb

## 2010-11-14 DIAGNOSIS — D012 Carcinoma in situ of rectum: Secondary | ICD-10-CM

## 2010-11-14 DIAGNOSIS — R197 Diarrhea, unspecified: Secondary | ICD-10-CM

## 2010-11-14 DIAGNOSIS — K589 Irritable bowel syndrome without diarrhea: Secondary | ICD-10-CM

## 2010-11-14 MED ORDER — ESOMEPRAZOLE MAGNESIUM 40 MG PO CPDR: 40.0000 mg | DELAYED_RELEASE_CAPSULE | Freq: Every day | ORAL | Status: DC

## 2010-11-14 NOTE — Patient Instructions (Signed)
Restora once daily Benefiber daily as directed Return stools to lab as soon as possible Return iFOBT here High fiber diet  High-Fiber Diet A high-fiber diet changes your normal diet to include more whole grains, legumes, fruits, and vegetables. Changes in the diet involve replacing refined carbohydrates with unrefined foods. The calorie level of the diet is essentially unchanged. The Dietary Reference Intake (recommended amount) for adult males is 38 grams per day. For adult females, it is 25 grams per day. Pregnant and lactating women should consume 28 grams of fiber per day. Fiber is the intact part of a plant that is not broken down during digestion. Functional fiber is fiber that has been isolated from the plant to provide a beneficial effect in the body. PURPOSE  Increase stool bulk.   Ease and regulate bowel movements.   Lower cholesterol.  INDICATIONS THAT YOU NEED MORE FIBER  Constipation and hemorrhoids.   Uncomplicated diverticulosis (intestine condition) and irritable bowel syndrome.   Weight management.   As a protective measure against hardening of the arteries (atherosclerosis), diabetes, and cancer.  NOTE OF CAUTION If you have a digestive or bowel problem, ask your caregiver for advice before adding high-fiber foods to your diet. Some of the following medical problems are such that a high-fiber diet should not be used without consulting your caregiver. DO NOT USE WITH:  Acute diverticulitis (intestine infection).   Partial small bowel obstructions.   Complicated diverticular disease involving bleeding, rupture (perforation), or abscess (boil, furuncle).   Presence of autonomic neuropathy (nerve damage) or gastric paresis (stomach cannot empty itself).  GUIDELINES FOR INCREASING FIBER IN THE DIET  Start adding fiber to the diet slowly. A gradual increase of about 5 more grams (2 slices of whole-wheat bread, 2 servings of most fruits or vegetables, or 1 bowl of  high-fiber cereal) per day is best. Too rapid an increase in fiber may result in constipation, flatulence, and bloating.   Drink enough water and fluids to keep your urine clear or pale yellow. Water, juice, or caffeine-free drinks are recommended. Not drinking enough fluid may cause constipation.   Eat a variety of high-fiber foods rather than one type of fiber.   Try to increase your intake of fiber through using high-fiber foods rather than fiber pills or supplements that contain small amounts of fiber.   The goal is to change the types of food eaten. Do not supplement your present diet with high-fiber foods, but replace foods in your present diet.  INCLUDE A VARIETY OF FIBER SOURCES  Replace refined and processed grains with whole grains, canned fruits with fresh fruits, and incorporate other fiber sources. White rice, white breads, and most bakery goods contain little or no fiber.   Brown whole-grain rice, buckwheat oats, and many fruits and vegetables are all good sources of fiber. These include: broccoli, Brussels sprouts, cabbage, cauliflower, beets, sweet potatoes, white potatoes (skin on), carrots, tomatoes, eggplant, squash, berries, fresh fruits, and dried fruits.   Cereals appear to be the richest source of fiber. Cereal fiber is found in whole grains and bran. Bran is the fiber-rich outer coat of cereal grain, which is largely removed in refining. In whole-grain cereals, the bran remains. In breakfast cereals, the largest amount of fiber is found in those with "bran" in their names. The fiber content is sometimes indicated on the label.   You may need to include additional fruits and vegetables each day.   In baking, for 1 cup white flour, you may  use the following substitutions:   1 cup whole-wheat flour minus 2 tablespoons.   1/2 cup white flour plus 1/2 cup whole-wheat flour.  References: Dietary Reference Intakes: Recommended Intakes for Individuals. [employer]. Institute of Medicine. Food and Nutrition Board. Document Released: 03/12/2005 Document Re-Released: 06/06/2009 [facility] Patient Information 2011 [place], [place].

## 2010-11-14 NOTE — Progress Notes (Signed)
Referring Provider: Ara Kussmaul, MD Primary Care Physician:  Ara Kussmaul, MD Primary Gastroenterologist:  Dr. Jena Gauss  Chief Complaint  Patient presents with  . Diarrhea    HPI:  Martha Espinoza is a 45 y.o. female here for follow up for IBS.  She uses levsin prn.  Miralax prn.  Stool softeners qhs.  Alters diarrhea.  Diarrhea non-stop past few days.  Nexium 40mg  daily controls GERD symptoms well.  Denies rectal bleeding or melena.  C/o anorexia.  Wt stable.  No foreign travel, pets, new meds, ill contacts or antibiotics.  Past Medical History  Diagnosis Date  . Polycystic ovarian syndrome   . Colitis due to Clostridium difficile 2001  . Hepatic hemangioma   . Anxiety   . IBS (irritable bowel syndrome)   . Salmonella   . GERD (gastroesophageal reflux disease)   . CIS (carcinoma in situ)     rectum  . Migraine     Past Surgical History  Procedure Date  . Cholecystectomy   . Colonoscopy 05/05/2007    adenoma  . Situ removed age 79    High-grade rectal adenoma removed from rectum   . Dilation and curettage of uterus     for vaginal bleeding    Current Outpatient Prescriptions  Medication Sig Dispense Refill  . buPROPion (WELLBUTRIN XL) 300 MG 24 hr tablet Take 300 mg by mouth daily.        . butalbital-acetaminophen-caffeine (FIORICET WITH CODEINE) 50-325-40-30 MG per capsule Take 1 capsule by mouth every 4 (four) hours as needed.        . cholecalciferol (VITAMIN D) 400 UNITS TABS Take 400 Units by mouth daily.        . citalopram (CELEXA) 40 MG tablet Take 40 mg by mouth daily.        . clonazePAM (KLONOPIN) 2 MG tablet Take 2 mg by mouth 2 (two) times daily as needed.        . docusate sodium (COLACE) 100 MG capsule Take 100 mg by mouth 2 (two) times daily.        Marland Kitchen EPINEPHrine (EPIPEN IJ) Inject as directed as needed.        Marland Kitchen esomeprazole (NEXIUM) 40 MG capsule Take 1 capsule (40 mg total) by mouth daily before breakfast.  31 capsule  11  . fexofenadine (ALLEGRA) 180  MG tablet Take 180 mg by mouth daily.        . hyoscyamine (LEVSIN SL) 0.125 MG SL tablet Place 0.125 mg under the tongue every 4 (four) hours as needed.        . montelukast (SINGULAIR) 10 MG tablet Take 10 mg by mouth at bedtime.        . polyethylene glycol (MIRALAX / GLYCOLAX) packet Take 17 g by mouth daily.        . promethazine (PHENERGAN) 25 MG tablet Take 25 mg by mouth every 6 (six) hours as needed.        Marland Kitchen UNABLE TO FIND Chlor tabs 1 tablet at QHS        . vitamin B-12 (CYANOCOBALAMIN) 50 MCG tablet Take 100 mcg by mouth daily.          Allergies as of 11/14/2010 - Review Complete 11/14/2010  Allergen Reaction Noted  . Codeine Nausea And Vomiting     Family History  Problem Relation Age of Onset  . Colon cancer Father 57  . Diverticulitis Mother     History   Social History  .  Marital Status: Married    Spouse Name: N/A    Number of Children: 1  . Years of Education: N/A   Occupational History  . own insurance co [employer]    Social History Main Topics  . Smoking status: Current Everyday Smoker -- 0.5 packs/day for 18 years    Types: Cigarettes  . Smokeless tobacco: Not on file  . Alcohol Use: No  . Drug Use: No  . Sexually Active: Not on file   Review of Systems: See history of present illness, otherwise negative ROS.  Physical Exam: BP 124/82  Pulse 105  Temp(Src) 98.5 F (36.9 C) (Temporal)  Ht 5\' 1"  (1.549 m)  Wt 256 lb 9.6 oz (116.393 kg)  BMI 48.48 kg/m2 General:   Alert,  Well-developed, obese, pleasant and cooperative in NAD Head:  Normocephalic and atraumatic. Eyes:  Sclera clear, no icterus.   Conjunctiva pink. Mouth:  No deformity or lesions, dentition normal. Oropharynx pink and moist. Neck:  Supple; no masses or thyromegaly. Heart:  Regular rate and rhythm; no murmurs, clicks, rubs,  or gallops. Abdomen:  Soft, obese, nontender and nondistended. No masses, hepatosplenomegaly or hernias noted. Normal bowel sounds, without guarding, and  without rebound.   Msk:  Symmetrical without gross deformities. Normal posture. Pulses:  Normal pulses noted. Extremities:  Trace bilateral lower extremity edema. Neurologic:  Alert and  oriented x4;  grossly normal neurologically. Skin:  Intact without significant lesions or rashes. Cervical Nodes:  No significant cervical adenopathy. Psych:  Alert and cooperative. Normal mood and affect.

## 2010-11-15 ENCOUNTER — Encounter: Payer: Self-pay | Admitting: Urgent Care

## 2010-11-15 ENCOUNTER — Ambulatory Visit: Payer: BC Managed Care – PPO | Admitting: Urgent Care

## 2010-11-15 DIAGNOSIS — R197 Diarrhea, unspecified: Secondary | ICD-10-CM

## 2010-11-15 LAB — IFOBT (OCCULT BLOOD): IFOBT: NEGATIVE

## 2010-11-15 NOTE — Progress Notes (Signed)
Agree. She can try ALIGN or flora q. daily.

## 2010-11-15 NOTE — Assessment & Plan Note (Signed)
Due for colonoscopy 04/2012. Check iFOBT.

## 2010-11-15 NOTE — Progress Notes (Unsigned)
Patient called about the medication we gave her Tuesday,Restora. She has an allergy to shellfish and this medication has shellfish in it, so told her not to take it and she could try an over the count probiotic.

## 2010-11-15 NOTE — Progress Notes (Signed)
Called her back and told her to try ALIGN or flora q. She stated that she could not take ALIGN because she has tried it before.

## 2010-11-15 NOTE — Assessment & Plan Note (Addendum)
Mixed-IBS. Symptoms lately have been more diarrhea-predominant. Will check Giardia and C. difficile PCR given her history of C. difficile. Will treat for IBS, and if symptoms persist consider colonoscopy sooner than 2014 given her history of high-grade rectal adenoma.  Restora once daily Benefiber daily as directed Return stools to lab as soon as possible Return iFOBT here High fiber diet

## 2010-11-16 LAB — CLOSTRIDIUM DIFFICILE BY PCR: Toxigenic C. Difficile by PCR: NOT DETECTED

## 2010-11-16 LAB — GIARDIA ANTIGEN: Giardia Screen (EIA): NEGATIVE

## 2010-11-16 NOTE — Progress Notes (Signed)
Noted  

## 2010-11-20 NOTE — Progress Notes (Signed)
Cc to PCP 

## 2010-12-15 LAB — HEMOGLOBIN AND HEMATOCRIT, BLOOD
HCT: 41
Hemoglobin: 13.7

## 2011-01-05 NOTE — Progress Notes (Signed)
pts insurance wanted her to try/fail Prilosec, prevacid, and protonix prior to paying for nexium. Have tried to reach pt by phone several times. Pt finally called back this am. She has been taking 2 otc prilosec and said it was helping a lot and was a lot cheaper than nexium. She will call back and let us know if she develops any problems.

## 2011-01-05 NOTE — Progress Notes (Signed)
Pt said it was cheaper to buy it otc.

## 2011-01-05 NOTE — Progress Notes (Signed)
Noted  

## 2011-01-05 NOTE — Progress Notes (Signed)
Does this patient need rx?

## 2011-01-05 NOTE — Progress Notes (Signed)
Need rx

## 2011-04-24 ENCOUNTER — Encounter (HOSPITAL_COMMUNITY): Payer: Self-pay | Admitting: *Deleted

## 2011-04-24 ENCOUNTER — Emergency Department (HOSPITAL_COMMUNITY)
Admission: EM | Admit: 2011-04-24 | Discharge: 2011-04-24 | Disposition: A | Discharge status: Home / Self Care | Payer: BC Managed Care – PPO | Attending: Emergency Medicine | Admitting: Emergency Medicine

## 2011-04-24 ENCOUNTER — Emergency Department (HOSPITAL_COMMUNITY): Payer: BC Managed Care – PPO

## 2011-04-24 DIAGNOSIS — E282 Polycystic ovarian syndrome: Secondary | ICD-10-CM | POA: Insufficient documentation

## 2011-04-24 DIAGNOSIS — M25569 Pain in unspecified knee: Secondary | ICD-10-CM | POA: Insufficient documentation

## 2011-04-24 DIAGNOSIS — Z85048 Personal history of other malignant neoplasm of rectum, rectosigmoid junction, and anus: Secondary | ICD-10-CM | POA: Insufficient documentation

## 2011-04-24 DIAGNOSIS — M25539 Pain in unspecified wrist: Secondary | ICD-10-CM | POA: Insufficient documentation

## 2011-04-24 DIAGNOSIS — W010XXA Fall on same level from slipping, tripping and stumbling without subsequent striking against object, initial encounter: Secondary | ICD-10-CM | POA: Insufficient documentation

## 2011-04-24 DIAGNOSIS — S0003XA Contusion of scalp, initial encounter: Secondary | ICD-10-CM

## 2011-04-24 DIAGNOSIS — S93409A Sprain of unspecified ligament of unspecified ankle, initial encounter: Secondary | ICD-10-CM

## 2011-04-24 DIAGNOSIS — K219 Gastro-esophageal reflux disease without esophagitis: Secondary | ICD-10-CM | POA: Insufficient documentation

## 2011-04-24 DIAGNOSIS — W19XXXA Unspecified fall, initial encounter: Secondary | ICD-10-CM | POA: Insufficient documentation

## 2011-04-24 DIAGNOSIS — F411 Generalized anxiety disorder: Secondary | ICD-10-CM | POA: Insufficient documentation

## 2011-04-24 DIAGNOSIS — R51 Headache: Secondary | ICD-10-CM | POA: Insufficient documentation

## 2011-04-24 DIAGNOSIS — F319 Bipolar disorder, unspecified: Secondary | ICD-10-CM | POA: Insufficient documentation

## 2011-04-24 DIAGNOSIS — Z79899 Other long term (current) drug therapy: Secondary | ICD-10-CM | POA: Insufficient documentation

## 2011-04-24 DIAGNOSIS — K589 Irritable bowel syndrome without diarrhea: Secondary | ICD-10-CM | POA: Insufficient documentation

## 2011-04-24 DIAGNOSIS — F17200 Nicotine dependence, unspecified, uncomplicated: Secondary | ICD-10-CM | POA: Insufficient documentation

## 2011-04-24 HISTORY — DX: Bipolar disorder, unspecified: F31.9

## 2011-04-24 LAB — CBC
HCT: 38.9 % (ref 36.0–46.0)
Hemoglobin: 12.4 g/dL (ref 12.0–15.0)
MCH: 25.4 pg — ABNORMAL LOW (ref 26.0–34.0)
MCHC: 31.9 g/dL (ref 30.0–36.0)
MCV: 79.7 fL (ref 78.0–100.0)
Platelets: 293 10*3/uL (ref 150–400)
RBC: 4.88 MIL/uL (ref 3.87–5.11)
RDW: 17.6 % — ABNORMAL HIGH (ref 11.5–15.5)
WBC: 10.1 10*3/uL (ref 4.0–10.5)

## 2011-04-24 LAB — BASIC METABOLIC PANEL
BUN: 12 mg/dL (ref 6–23)
CO2: 25 mEq/L (ref 19–32)
Calcium: 8.7 mg/dL (ref 8.4–10.5)
Chloride: 108 mEq/L (ref 96–112)
Creatinine, Ser: 0.76 mg/dL (ref 0.50–1.10)
GFR calc Af Amer: >90 mL/min (ref >90)
GFR calc non Af Amer: >90 mL/min (ref >90)
Glucose, Bld: 102 mg/dL — ABNORMAL HIGH (ref 70–99)
Potassium: 3.9 mEq/L (ref 3.5–5.1)
Sodium: 141 mEq/L (ref 135–145)

## 2011-04-24 MED ORDER — ONDANSETRON HCL 4 MG PO TABS
4.0000 mg | ORAL_TABLET | Freq: Once | ORAL | Status: AC
  Administered 2011-04-24: 4 mg via ORAL
  Filled 2011-04-24: qty 1

## 2011-04-24 MED ORDER — HYDROCODONE-ACETAMINOPHEN 5-325 MG PO TABS: 1.0000 | ORAL_TABLET | ORAL | Status: AC | PRN

## 2011-04-24 MED ORDER — HYDROCODONE-ACETAMINOPHEN 5-325 MG PO TABS
2.0000 | ORAL_TABLET | Freq: Once | ORAL | Status: AC
  Administered 2011-04-24: 2 via ORAL
  Filled 2011-04-24: qty 2

## 2011-04-24 NOTE — ED Provider Notes (Signed)
History     CSN: [id number]  Arrival date & time 04/24/11  1537   First MD Initiated Contact with Patient 04/24/11 1658      Chief Complaint  Patient presents with  . Fall    (Consider location/radiation/quality/duration/timing/severity/associated sxs/prior treatment) Patient is a 46 y.o. female presenting with fall. The history is provided by the patient.  Fall The accident occurred 1 to 2 hours ago. Incident: standing and smoking outside. She landed on dirt. There was no blood loss. The point of impact was the left elbow (left lower extremity). The pain is present in the left knee and left elbow. The pain is moderate. She was ambulatory at the scene. Associated symptoms include headaches. Pertinent negatives include no abdominal pain and no hematuria. The symptoms are aggravated by activity. She has tried nothing for the symptoms.    Past Medical History  Diagnosis Date  . Polycystic ovarian syndrome   . Colitis due to Clostridium difficile 2001  . Hepatic hemangioma   . Anxiety   . IBS (irritable bowel syndrome)   . Salmonella   . GERD (gastroesophageal reflux disease)   . CIS (carcinoma in situ)     rectum  . Migraine   . Bipolar 1 disorder     Past Surgical History  Procedure Date  . Cholecystectomy   . Colonoscopy 05/05/2007    adenoma  . Situ removed age 52    High-grade rectal adenoma removed from rectum   . Dilation and curettage of uterus     for vaginal bleeding    Family History  Problem Relation Age of Onset  . Colon cancer Father 57  . Diverticulitis Mother     History  Substance Use Topics  . Smoking status: Current Everyday Smoker -- 0.5 packs/day for 18 years    Types: Cigarettes  . Smokeless tobacco: Not on file  . Alcohol Use: No    OB History    Grav Para Term Preterm Abortions TAB SAB Ect Mult Living                  Review of Systems  Constitutional: Negative for activity change.       All ROS Neg except as noted in HPI  HENT:  Negative for nosebleeds and neck pain.   Eyes: Negative for photophobia and discharge.  Respiratory: Negative for cough, shortness of breath and wheezing.   Cardiovascular: Negative for chest pain and palpitations.  Gastrointestinal: Positive for diarrhea. Negative for abdominal pain and blood in stool.  Genitourinary: Negative for dysuria, frequency and hematuria.  Musculoskeletal: Negative for back pain and arthralgias.  Skin: Negative.   Neurological: Positive for headaches. Negative for dizziness, seizures and speech difficulty.  Psychiatric/Behavioral: Negative for hallucinations and confusion. The patient is nervous/anxious.     Allergies  Codeine; Latex; and Shellfish allergy  Home Medications   Current Outpatient Rx  Name Route Sig Dispense Refill  . BUPROPION HCL ER (XL) 300 MG PO TB24 Oral Take 300 mg by mouth daily.      Marland Kitchen BUTALBITAL-APAP-CAFF-COD 50-325-40-30 MG PO CAPS Oral Take 1 capsule by mouth every 4 (four) hours as needed.      . CHOLECALCIFEROL 400 UNITS PO TABS Oral Take 400 Units by mouth daily.      Marland Kitchen CITALOPRAM HYDROBROMIDE 40 MG PO TABS Oral Take 40 mg by mouth daily.      Marland Kitchen CLONAZEPAM 2 MG PO TABS Oral Take 2 mg by mouth 2 (two) times daily as  needed.      Marland Kitchen DOCUSATE SODIUM 100 MG PO CAPS Oral Take 100 mg by mouth 2 (two) times daily.      Marland Kitchen EPIPEN IJ Injection Inject as directed as needed.      Marland Kitchen ESOMEPRAZOLE MAGNESIUM 40 MG PO CPDR Oral Take 1 capsule (40 mg total) by mouth daily before breakfast. 31 capsule 11  . FEXOFENADINE HCL 180 MG PO TABS Oral Take 180 mg by mouth daily.      Marland Kitchen HYOSCYAMINE SULFATE 0.125 MG SL SUBL Sublingual Place 0.125 mg under the tongue every 4 (four) hours as needed.      Marland Kitchen MONTELUKAST SODIUM 10 MG PO TABS Oral Take 10 mg by mouth at bedtime.      Marland Kitchen POLYETHYLENE GLYCOL 3350 PO PACK Oral Take 17 g by mouth daily.      Marland Kitchen PROMETHAZINE HCL 25 MG PO TABS Oral Take 25 mg by mouth every 6 (six) hours as needed.      Marland Kitchen UNABLE TO FIND   Chlor tabs 1 tablet at [facility]      . VITAMIN B-12 50 MCG PO TABS Oral Take 100 mcg by mouth daily.        BP 134/80  Temp(Src) 98 F (36.7 C) (Oral)  Resp 20  Ht 5\' 1"  (1.549 m)  Wt 240 lb (108.863 kg)  BMI 45.35 kg/m2  SpO2 99%  LMP 04/22/2011  Physical Exam  Nursing note and vitals reviewed. Constitutional: She is oriented to person, place, and time. She appears well-developed and well-nourished.  Non-toxic appearance.  HENT:  Head: Normocephalic.  Right Ear: Tympanic membrane and external ear normal.  Left Ear: Tympanic membrane and external ear normal.       Sore area of the posterior scalp.  Eyes: EOM and lids are normal. Pupils are equal, round, and reactive to light.  Neck: Normal range of motion. Neck supple. Carotid bruit is not present.  Cardiovascular: Normal rate, regular rhythm, normal heart sounds, intact distal pulses and normal pulses.   Pulmonary/Chest: Breath sounds normal. No respiratory distress. She exhibits no tenderness.  Abdominal: Soft. Bowel sounds are normal. There is no tenderness. There is no guarding.  Musculoskeletal: Normal range of motion.       Pain to palpation and attempted ROM of the left lower leg and ankle area.   Lymphadenopathy:       Head (right side): No submandibular adenopathy present.       Head (left side): No submandibular adenopathy present.    She has no cervical adenopathy.  Neurological: She is alert and oriented to person, place, and time. She has normal strength. No cranial nerve deficit or sensory deficit.  Skin: Skin is warm and dry.  Psychiatric: She has a normal mood and affect. Her speech is normal.    ED Course  Procedures (including critical care time) Pulse Ox  99% on room air. WNL by my interpretation. Labs Reviewed - No data to display No results found.   No diagnosis found.    MDM  I have reviewed nursing notes, vital signs, and all appropriate lab and imaging results for this patient.  Test results  reviewed with pt.  Rx for Norco given.      Kathie Dike, [place] 04/25/11 2131

## 2011-04-24 NOTE — ED Notes (Signed)
Pt states that she fell while outside smoking and injured her left lower leg and foot. States that her leg twisted behind her. Pt states that she is not sure why she fell or if she hit her head or lost consciousness. Pt states that it is not workman's comp even though it happened at work.

## 2011-04-25 NOTE — ED Provider Notes (Signed)
Medical screening examination/treatment/procedure(s) were performed by non-physician practitioner and as supervising physician I was immediately available for consultation/collaboration.   Raevon Broom, MD 04/25/11 2334 

## 2011-05-14 ENCOUNTER — Other Ambulatory Visit (HOSPITAL_COMMUNITY): Payer: Self-pay | Admitting: Pediatrics

## 2011-05-14 ENCOUNTER — Ambulatory Visit (HOSPITAL_COMMUNITY)
Admission: RE | Admit: 2011-05-14 | Discharge: 2011-05-14 | Disposition: A | Discharge status: Home / Self Care | Payer: BC Managed Care – PPO | Source: Ambulatory Visit | Attending: Pediatrics | Admitting: Pediatrics

## 2011-05-14 DIAGNOSIS — M546 Pain in thoracic spine: Secondary | ICD-10-CM | POA: Insufficient documentation

## 2011-05-14 DIAGNOSIS — M549 Dorsalgia, unspecified: Secondary | ICD-10-CM

## 2011-05-14 DIAGNOSIS — W19XXXA Unspecified fall, initial encounter: Secondary | ICD-10-CM

## 2011-05-14 DIAGNOSIS — IMO0002 Reserved for concepts with insufficient information to code with codable children: Secondary | ICD-10-CM | POA: Insufficient documentation

## 2011-06-18 ENCOUNTER — Emergency Department (HOSPITAL_COMMUNITY): Payer: BC Managed Care – PPO

## 2011-06-18 ENCOUNTER — Other Ambulatory Visit: Payer: Self-pay

## 2011-06-18 ENCOUNTER — Emergency Department (HOSPITAL_COMMUNITY)
Admission: EM | Admit: 2011-06-18 | Discharge: 2011-06-18 | Disposition: A | Discharge status: Home / Self Care | Payer: BC Managed Care – PPO | Attending: Emergency Medicine | Admitting: Emergency Medicine

## 2011-06-18 ENCOUNTER — Encounter (HOSPITAL_COMMUNITY): Payer: Self-pay | Admitting: *Deleted

## 2011-06-18 DIAGNOSIS — F411 Generalized anxiety disorder: Secondary | ICD-10-CM | POA: Insufficient documentation

## 2011-06-18 DIAGNOSIS — R209 Unspecified disturbances of skin sensation: Secondary | ICD-10-CM | POA: Insufficient documentation

## 2011-06-18 DIAGNOSIS — F419 Anxiety disorder, unspecified: Secondary | ICD-10-CM

## 2011-06-18 DIAGNOSIS — K219 Gastro-esophageal reflux disease without esophagitis: Secondary | ICD-10-CM | POA: Insufficient documentation

## 2011-06-18 DIAGNOSIS — F319 Bipolar disorder, unspecified: Secondary | ICD-10-CM | POA: Insufficient documentation

## 2011-06-18 DIAGNOSIS — R110 Nausea: Secondary | ICD-10-CM | POA: Insufficient documentation

## 2011-06-18 DIAGNOSIS — M542 Cervicalgia: Secondary | ICD-10-CM | POA: Insufficient documentation

## 2011-06-18 DIAGNOSIS — R202 Paresthesia of skin: Secondary | ICD-10-CM

## 2011-06-18 DIAGNOSIS — Z79899 Other long term (current) drug therapy: Secondary | ICD-10-CM | POA: Insufficient documentation

## 2011-06-18 DIAGNOSIS — F17200 Nicotine dependence, unspecified, uncomplicated: Secondary | ICD-10-CM | POA: Insufficient documentation

## 2011-06-18 DIAGNOSIS — M549 Dorsalgia, unspecified: Secondary | ICD-10-CM | POA: Insufficient documentation

## 2011-06-18 DIAGNOSIS — G43909 Migraine, unspecified, not intractable, without status migrainosus: Secondary | ICD-10-CM | POA: Insufficient documentation

## 2011-06-18 HISTORY — DX: Migraine, unspecified, not intractable, without status migrainosus: G43.909

## 2011-06-18 LAB — LIPASE, BLOOD: Lipase: 26 U/L (ref 11–59)

## 2011-06-18 LAB — URINALYSIS, ROUTINE W REFLEX MICROSCOPIC
Bilirubin Urine: NEGATIVE
Glucose, UA: NEGATIVE mg/dL
Ketones, ur: NEGATIVE mg/dL
Nitrite: NEGATIVE
Protein, ur: NEGATIVE mg/dL
Specific Gravity, Urine: 1.015 (ref 1.005–1.030)
Urobilinogen, UA: 0.2 mg/dL (ref 0.0–1.0)
pH: 7 (ref 5.0–8.0)

## 2011-06-18 LAB — DIFFERENTIAL
Basophils Absolute: 0 10*3/uL (ref 0.0–0.1)
Basophils Relative: 0 % (ref 0–1)
Eosinophils Absolute: 0.1 10*3/uL (ref 0.0–0.7)
Eosinophils Relative: 1 % (ref 0–5)
Lymphocytes Relative: 27 % (ref 12–46)
Lymphs Abs: 2.7 10*3/uL (ref 0.7–4.0)
Monocytes Absolute: 0.5 10*3/uL (ref 0.1–1.0)
Monocytes Relative: 5 % (ref 3–12)
Neutro Abs: 6.8 10*3/uL (ref 1.7–7.7)
Neutrophils Relative %: 68 % (ref 43–77)

## 2011-06-18 LAB — CBC
HCT: 42.7 % (ref 36.0–46.0)
Hemoglobin: 13.9 g/dL (ref 12.0–15.0)
MCH: 26.7 pg (ref 26.0–34.0)
MCHC: 32.6 g/dL (ref 30.0–36.0)
MCV: 82.1 fL (ref 78.0–100.0)
Platelets: 309 10*3/uL (ref 150–400)
RBC: 5.2 MIL/uL — ABNORMAL HIGH (ref 3.87–5.11)
RDW: 15.8 % — ABNORMAL HIGH (ref 11.5–15.5)
WBC: 10 10*3/uL (ref 4.0–10.5)

## 2011-06-18 LAB — URINE MICROSCOPIC-ADD ON

## 2011-06-18 LAB — POCT I-STAT TROPONIN I: Troponin i, poc: 0.01 ng/mL (ref 0.00–0.08)

## 2011-06-18 LAB — BASIC METABOLIC PANEL
BUN: 9 mg/dL (ref 6–23)
CO2: 25 mEq/L (ref 19–32)
Calcium: 9.6 mg/dL (ref 8.4–10.5)
Chloride: 101 mEq/L (ref 96–112)
Creatinine, Ser: 0.78 mg/dL (ref 0.50–1.10)
GFR calc Af Amer: >90 mL/min (ref >90)
GFR calc non Af Amer: >90 mL/min (ref >90)
Glucose, Bld: 87 mg/dL (ref 70–99)
Potassium: 3.8 mEq/L (ref 3.5–5.1)
Sodium: 137 mEq/L (ref 135–145)

## 2011-06-18 LAB — HEPATIC FUNCTION PANEL
ALT: 9 U/L (ref 0–35)
AST: 11 U/L (ref 0–37)
Albumin: 4 g/dL (ref 3.5–5.2)
Alkaline Phosphatase: 66 U/L (ref 39–117)
Bilirubin, Direct: <0.1 mg/dL (ref 0.0–0.3)
Total Bilirubin: 0.3 mg/dL (ref 0.3–1.2)
Total Protein: 7.5 g/dL (ref 6.0–8.3)

## 2011-06-18 LAB — POCT PREGNANCY, URINE: Preg Test, Ur: NEGATIVE

## 2011-06-18 MED ORDER — ONDANSETRON HCL 4 MG PO TABS: 4.0000 mg | ORAL_TABLET | Freq: Four times a day (QID) | ORAL | Status: AC | PRN

## 2011-06-18 MED ORDER — ONDANSETRON HCL 4 MG/2ML IJ SOLN
4.0000 mg | INTRAMUSCULAR | Status: DC | PRN
  Administered 2011-06-18: 4 mg via INTRAVENOUS
  Filled 2011-06-18: qty 2

## 2011-06-18 MED ORDER — HYDROCODONE-ACETAMINOPHEN 5-325 MG PO TABS
2.0000 | ORAL_TABLET | Freq: Once | ORAL | Status: AC
  Administered 2011-06-18: 2 via ORAL
  Filled 2011-06-18: qty 2

## 2011-06-18 MED ORDER — SODIUM CHLORIDE 0.9 % IV SOLN
Freq: Once | INTRAVENOUS | Status: AC
  Administered 2011-06-18: 13:00:00 via INTRAVENOUS

## 2011-06-18 NOTE — ED Provider Notes (Addendum)
History    This chart was scribed for Laray Anger, DO, MD by Smitty Pluck. The patient was seen in room APA15 and the patient's care was started at 12:35PM.   CSN: [id number]  Arrival date & time 06/18/11  1059   First MD Initiated Contact with Patient 06/18/11 1214      Chief Complaint  Patient presents with  . Nausea  . Numbness  . Chest Pain     HPI Martha Espinoza is a 46 y.o. female who presents to the Emergency Department complaining of gradual onset and persistence of constant nausea and "numbness" to her upper back area which radiates into her left arm that began last night.  The symptoms have been constant since onset. Has been associated with feeling "shakey."  Describes her symptoms as "it might be my anxiety."  Denies vomiting/diarrhea, no CP/SOB, no cough, no abd pain, no fevers, no rash.      Past Medical History  Diagnosis Date  . Polycystic ovarian syndrome   . Colitis due to Clostridium difficile 2001  . Hepatic hemangioma   . Anxiety   . IBS (irritable bowel syndrome)   . Salmonella   . GERD (gastroesophageal reflux disease)   . CIS (carcinoma in situ)     rectum  . Migraine   . Bipolar 1 disorder   . Migraine headache     Past Surgical History  Procedure Date  . Cholecystectomy   . Colonoscopy 05/05/2007    adenoma  . Situ removed age 84    High-grade rectal adenoma removed from rectum   . Dilation and curettage of uterus     for vaginal bleeding    Family History  Problem Relation Age of Onset  . Colon cancer Father 81  . Diverticulitis Mother     History  Substance Use Topics  . Smoking status: Current Everyday Smoker -- 0.5 packs/day for 18 years    Types: Cigarettes  . Smokeless tobacco: Not on file  . Alcohol Use: No    Review of Systems ROS: Statement: All systems negative except as marked or noted in the HPI; Constitutional: Negative for fever and chills. ; ; Eyes: Negative for eye pain, redness and discharge. ; ; ENMT:  Negative for ear pain, hoarseness, nasal congestion, sinus pressure and sore throat. ; ; Cardiovascular: Negative for chest pain, palpitations, diaphoresis, dyspnea and peripheral edema. ; ; Respiratory: Negative for cough, wheezing and stridor. ; ; Gastrointestinal: +nausea. Negative for vomiting, diarrhea, abdominal pain, blood in stool, hematemesis, jaundice and rectal bleeding. . ; ; Genitourinary: Negative for dysuria, flank pain and hematuria. ; ; Musculoskeletal: +back pain and neck pain. Negative for swelling and trauma.; ; Skin: Negative for pruritus, rash, abrasions, blisters, bruising and skin lesion.; ; Neuro: +paresthesias.  Negative for headache, lightheadedness and neck stiffness. Negative for weakness, altered level of consciousness , altered mental status, extremity weakness, involuntary movement, seizure and syncope.; Psych:  No SI, no SA, no HI, no hallucinations. +anxiety, panic attack.   Allergies  Shellfish allergy; Codeine; and Latex  Home Medications   Current Outpatient Rx  Name Route Sig Dispense Refill  . ALBUTEROL SULFATE HFA 108 (90 BASE) MCG/ACT IN AERS Inhalation Inhale 2 puffs into the lungs every 6 (six) hours as needed. For shortness of breath    . ARIPIPRAZOLE 15 MG PO TABS Oral Take 15 mg by mouth daily.    . ASPIRIN EC 81 MG PO TBEC Oral Take 81 mg by mouth as  needed. For chest and arm pain associated with panic attacks    . BUTALBITAL-APAP-CAFF-COD 50-325-40-30 MG PO CAPS Oral Take 1 capsule by mouth every 4 (four) hours as needed. migraines    . CHLORPHENIRAMINE MALEATE 4 MG PO TABS Oral Take 4 mg by mouth at bedtime as needed.     . CHOLECALCIFEROL 400 UNITS PO TABS Oral Take 400 Units by mouth daily.      Marland Kitchen CLONAZEPAM 2 MG PO TABS Oral Take 1 mg by mouth 2 (two) times daily as needed. For panic/nerves. **Take one-half tablet by mouth in the morning and one tablet at bedtime. May take one tablet extra daily for panic episodes**    . DIVALPROEX SODIUM ER 500 MG  PO TB24 Oral Take 500 mg by mouth at bedtime.    Marland Kitchen DOCUSATE SODIUM 100 MG PO CAPS Oral Take 200 mg by mouth at bedtime.     Marland Kitchen FERROUS SULFATE 325 (65 FE) MG PO TABS Oral Take 325 mg by mouth daily.    Marland Kitchen FEXOFENADINE HCL 180 MG PO TABS Oral Take 180 mg by mouth at bedtime as needed.     Marland Kitchen LAMOTRIGINE 200 MG PO TABS Oral Take 200 mg by mouth daily.    Marland Kitchen METOPROLOL TARTRATE 25 MG PO TABS Oral Take 25 mg by mouth 2 (two) times daily. **Patient only takes one tablet at bedtime**    . MOMETASONE FURO-FORMOTEROL FUM 100-5 MCG/ACT IN AERO Inhalation Inhale 2 puffs into the lungs 2 (two) times daily.    Marland Kitchen MONTELUKAST SODIUM 10 MG PO TABS Oral Take 10 mg by mouth daily as needed. For allergies    . ADULT MULTIVITAMIN W/MINERALS CH Oral Take 1 tablet by mouth daily.    Marland Kitchen RANITIDINE HCL 150 MG PO TABS Oral Take 150 mg by mouth at bedtime.    . TRAZODONE HCL 100 MG PO TABS Oral Take 50-150 mg by mouth at bedtime.    Marland Kitchen VITAMIN B-12 50 MCG PO TABS Oral Take 100 mcg by mouth daily.      Marland Kitchen EPIPEN IJ Injection Inject as directed as needed.        BP 126/72  Pulse 88  Temp(Src) 97.9 F (36.6 C) (Oral)  Resp 20  Ht 5\' 1"  (1.549 m)  Wt 231 lb (104.781 kg)  BMI 43.65 kg/m2  SpO2 100%  LMP 05/19/2011  Physical Exam 1235: Physical examination:  Nursing notes reviewed; Vital signs and O2 SAT reviewed;  Constitutional: Well developed, Well nourished, Well hydrated, In no acute distress; Head:  Normocephalic, atraumatic; Eyes: EOMI, PERRL, No scleral icterus; ENMT: Mouth and pharynx normal, Mucous membranes moist; Neck: Supple, Full range of motion, No lymphadenopathy; Cardiovascular: Regular rate and rhythm, No murmur, rub, or gallop; Respiratory: Breath sounds clear & equal bilaterally, No rales, rhonchi, wheezes, or rub, Normal respiratory effort/excursion; Chest: Nontender, Movement normal; Abdomen: Soft, Nontender, Nondistended, Normal bowel sounds; Extremities: Pulses normal, No tenderness, No edema, No calf  edema or asymmetry.; Spine:  No midline CS, TS, LS tenderness. +TTP left>right hypertonic trapezius muscles which reproduces pt's pain.; Neuro: AA&Ox3, Major CN grossly intact. Strength 5/5 equal bilat UE's and LE's, no drift x4 extremities.  DTR 2/4 equal bilat UE's and LE's.  No gross sensory deficits.  Speech clear.  No facial droop.; No gross focal motor or sensory deficits in extremities.; Skin: Color normal, Warm, Dry, no rash; Psych:  Anxiety.   ED Course  Procedures   1400:  Pt now states to staff she  is "getting a migraine" and requesting "some pain meds."  Will give PO meds.  CXR pending.    2:46 PM:  No N/V while in ED.  No stooling.  VSS, neuro exam unchanged.  Feels better and wants to go home now.  Dx testing d/w pt and family.  Questions answered.  Verb understanding, agreeable to d/c home with outpt f/u.    MDM  MDM Reviewed: nursing note, vitals and previous chart Reviewed previous: ECG Interpretation: ECG, labs and x-ray    Date: 06/18/2011  Rate: 81  Rhythm: normal sinus rhythm  QRS Axis: normal  Intervals: normal  ST/T Wave abnormalities: normal  Conduction Disutrbances:none  Narrative Interpretation:   Old EKG Reviewed: unchanged; no significant changes from previous EKG dated 10/27/2007.   Results for orders placed during the hospital encounter of 06/18/11  CBC      Component Value Range   WBC 10.0  4.0 - 10.5 (K/uL)   RBC 5.20 (*) 3.87 - 5.11 (MIL/uL)   Hemoglobin 13.9  12.0 - 15.0 (g/dL)   HCT 19.1  47.8 - 29.5 (%)   MCV 82.1  78.0 - 100.0 (fL)   MCH 26.7  26.0 - 34.0 (pg)   MCHC 32.6  30.0 - 36.0 (g/dL)   RDW 62.1 (*) 30.8 - 15.5 (%)   Platelets 309  150 - 400 (K/uL)  DIFFERENTIAL      Component Value Range   Neutrophils Relative 68  43 - 77 (%)   Neutro Abs 6.8  1.7 - 7.7 (K/uL)   Lymphocytes Relative 27  12 - 46 (%)   Lymphs Abs 2.7  0.7 - 4.0 (K/uL)   Monocytes Relative 5  3 - 12 (%)   Monocytes Absolute 0.5  0.1 - 1.0 (K/uL)   Eosinophils  Relative 1  0 - 5 (%)   Eosinophils Absolute 0.1  0.0 - 0.7 (K/uL)   Basophils Relative 0  0 - 1 (%)   Basophils Absolute 0.0  0.0 - 0.1 (K/uL)  BASIC METABOLIC PANEL      Component Value Range   Sodium 137  135 - 145 (mEq/L)   Potassium 3.8  3.5 - 5.1 (mEq/L)   Chloride 101  96 - 112 (mEq/L)   CO2 25  19 - 32 (mEq/L)   Glucose, Bld 87  70 - 99 (mg/dL)   BUN 9  6 - 23 (mg/dL)   Creatinine, Ser 6.57  0.50 - 1.10 (mg/dL)   Calcium 9.6  8.4 - 84.6 (mg/dL)   GFR calc non Af Amer >90  >90 (mL/min)   GFR calc Af Amer >90  >90 (mL/min)  POCT I-STAT TROPONIN I      Component Value Range   Troponin i, poc 0.01  0.00 - 0.08 (ng/mL)   Comment 3           LIPASE, BLOOD      Component Value Range   Lipase 26  11 - 59 (U/L)  HEPATIC FUNCTION PANEL      Component Value Range   Total Protein 7.5  6.0 - 8.3 (g/dL)   Albumin 4.0  3.5 - 5.2 (g/dL)   AST 11  0 - 37 (U/L)   ALT 9  0 - 35 (U/L)   Alkaline Phosphatase 66  39 - 117 (U/L)   Total Bilirubin 0.3  0.3 - 1.2 (mg/dL)   Bilirubin, Direct <9.6  0.0 - 0.3 (mg/dL)   Indirect Bilirubin NOT CALCULATED  0.3 - 0.9 (mg/dL)  URINALYSIS, ROUTINE W REFLEX MICROSCOPIC      Component Value Range   Color, Urine YELLOW  YELLOW    APPearance HAZY (*) CLEAR    Specific Gravity, Urine 1.015  1.005 - 1.030    pH 7.0  5.0 - 8.0    Glucose, UA NEGATIVE  NEGATIVE (mg/dL)   Hgb urine dipstick TRACE (*) NEGATIVE    Bilirubin Urine NEGATIVE  NEGATIVE    Ketones, ur NEGATIVE  NEGATIVE (mg/dL)   Protein, ur NEGATIVE  NEGATIVE (mg/dL)   Urobilinogen, UA 0.2  0.0 - 1.0 (mg/dL)   Nitrite NEGATIVE  NEGATIVE    Leukocytes, UA SMALL (*) NEGATIVE   POCT PREGNANCY, URINE      Component Value Range   Preg Test, Ur NEGATIVE  NEGATIVE   URINE MICROSCOPIC-ADD ON      Component Value Range   Squamous Epithelial / LPF MANY (*) RARE    WBC, UA 3-6  <3 (WBC/hpf)   RBC / HPF 3-6  <3 (RBC/hpf)   Bacteria, UA FEW (*) RARE     Dg Chest 2 View 06/18/2011  *RADIOLOGY  REPORT*  Clinical Data: Fever, cough.  CHEST - 2 VIEW  Comparison: 04/14/2008  Findings: Cardiomediastinal silhouette is within normal limits. The lungs are free of focal consolidations and pleural effusions. No edema.  Surgical clips are identified in the right upper quadrant of the abdomen. Visualized osseous structures have a normal appearance.  IMPRESSION: No evidence for acute cardiopulmonary abnormality.  Original Report Authenticated By: Patterson Hammersmith, M.D.         I personally performed the services described in this documentation, which was scribed in my presence. The recorded information has been reviewed and considered.   Laray Anger, DO 06/20/11 1951  Laray Anger, DO 06/20/11 2000

## 2011-06-18 NOTE — Discharge Instructions (Signed)
RESOURCE GUIDE  Dental Problems  Patients with Medicaid: [facility]                     [facility] [address]                                           [address] Phone:  [phone]                                                  Phone:  [phone]  If unable to pay or uninsured, contact:  [facility] or [facility] to become qualified for the adult dental clinic.  Chronic Pain Problems Contact [facility]  [phone] Patients need to be referred by their primary care doctor.  Insufficient Money for Medicine Contact [employer]:  call "211" or [facility] 271-5999.  No Primary Care Doctor Call [facility]  [phone] Other agencies that provide inexpensive medical care    [facility]  [phone]    [facility]  [phone]    [facility]  [phone]    Women's Clinic  [phone]    [facility]  [phone]    [facility]  [phone]  Psychological Services [facility]  [phone] [facility]  [phone] [facility]   [phone] (emergency services [phone])  Substance Abuse Resources [facility]  336-882-2125 [facility] 336-784-9470 [facility] 336-285-9073 [facility] 336-845-3988 Residential & Outpatient Substance Abuse Program  800-659-3381  Abuse/Neglect [place] Child Abuse Hotline (336) 641-3795 [place] Child Abuse Hotline 800-378-5315 (After Hours)  Emergency Shelter [facility] (336) 271-5985  Maternity Homes [facility] (336) 275-9566 [facility] (704) 372-4663  MRSA Hotline #:   832-7006    [place] [facility]  [facility] [place]     [employer]                          [facility] [address] [place]                       [address]      [address]  [place]                                                [place]                            [place] Phone:  [phone]                                   Phone:  [phone]                 Phone:  [phone]  [facility] Phone:  [phone]    [facility] Child Abuse Hotline 608-598-7622 210 548 1597 (After Hours)    Take the prescription as directed. Eat a bland diet and advance to your regular diet slowly as you can tolerate it.   Call your regular medical doctor today to schedule a follow up appointment this week.  Return to the Emergency Department immediately if not improving (or even worsening) despite taking the medicines as prescribed, any black or bloody stool or vomit, if you develop a fever, or for any other concerns.

## 2011-06-18 NOTE — ED Notes (Addendum)
Pt presents to ed with c/o nausea, numbness to upper back area that radiates down left arm, pt states that it started last night around 10pm, pt states "I just don';t feel right". Pt denies any pain but states that she did have left side chest pain this am but it has went away now.

## 2011-11-12 ENCOUNTER — Emergency Department (HOSPITAL_COMMUNITY)
Admission: EM | Admit: 2011-11-12 | Discharge: 2011-11-12 | Disposition: A | Discharge status: Home / Self Care | Payer: BC Managed Care – PPO | Attending: Emergency Medicine | Admitting: Emergency Medicine

## 2011-11-12 ENCOUNTER — Encounter (HOSPITAL_COMMUNITY): Payer: Self-pay | Admitting: *Deleted

## 2011-11-12 DIAGNOSIS — Z91013 Allergy to seafood: Secondary | ICD-10-CM | POA: Insufficient documentation

## 2011-11-12 DIAGNOSIS — Z885 Allergy status to narcotic agent status: Secondary | ICD-10-CM | POA: Insufficient documentation

## 2011-11-12 DIAGNOSIS — G43909 Migraine, unspecified, not intractable, without status migrainosus: Secondary | ICD-10-CM | POA: Insufficient documentation

## 2011-11-12 DIAGNOSIS — K219 Gastro-esophageal reflux disease without esophagitis: Secondary | ICD-10-CM | POA: Insufficient documentation

## 2011-11-12 DIAGNOSIS — Z8379 Family history of other diseases of the digestive system: Secondary | ICD-10-CM | POA: Insufficient documentation

## 2011-11-12 DIAGNOSIS — D012 Carcinoma in situ of rectum: Secondary | ICD-10-CM | POA: Insufficient documentation

## 2011-11-12 DIAGNOSIS — Z800 Family history of malignant neoplasm of digestive organs: Secondary | ICD-10-CM | POA: Insufficient documentation

## 2011-11-12 DIAGNOSIS — F411 Generalized anxiety disorder: Secondary | ICD-10-CM | POA: Insufficient documentation

## 2011-11-12 DIAGNOSIS — F17200 Nicotine dependence, unspecified, uncomplicated: Secondary | ICD-10-CM | POA: Insufficient documentation

## 2011-11-12 DIAGNOSIS — K589 Irritable bowel syndrome without diarrhea: Secondary | ICD-10-CM | POA: Insufficient documentation

## 2011-11-12 DIAGNOSIS — D1803 Hemangioma of intra-abdominal structures: Secondary | ICD-10-CM | POA: Insufficient documentation

## 2011-11-12 DIAGNOSIS — Z91040 Latex allergy status: Secondary | ICD-10-CM | POA: Insufficient documentation

## 2011-11-12 DIAGNOSIS — E282 Polycystic ovarian syndrome: Secondary | ICD-10-CM | POA: Insufficient documentation

## 2011-11-12 DIAGNOSIS — M549 Dorsalgia, unspecified: Secondary | ICD-10-CM

## 2011-11-12 DIAGNOSIS — F319 Bipolar disorder, unspecified: Secondary | ICD-10-CM | POA: Insufficient documentation

## 2011-11-12 MED ORDER — CYCLOBENZAPRINE HCL 10 MG PO TABS: 10.0000 mg | ORAL_TABLET | Freq: Two times a day (BID) | ORAL | Status: AC | PRN

## 2011-11-12 MED ORDER — HYDROCODONE-ACETAMINOPHEN 5-325 MG PO TABS: 1.0000 | ORAL_TABLET | ORAL | Status: AC | PRN

## 2011-11-12 NOTE — ED Notes (Signed)
Lower and upper back pain, pain radiates down both legs esp left per pt, pt states that she was helping her boyfriend work on a house 2 days ago and since with pain

## 2011-11-12 NOTE — ED Provider Notes (Signed)
History  This chart was scribed for Martha Hutching, MD by Martha Espinoza. This patient was seen in room APFT22/APFT22 and the patient's care was started at 15:20.   CSN: [id number]  Arrival date & time 11/12/11  1318   First MD Initiated Contact with Patient 11/12/11 1520      Chief Complaint  Patient presents with  . Back Pain    (Consider location/radiation/quality/duration/timing/severity/associated sxs/prior treatment) The history is provided by the patient. No language interpreter was used.   Martha Espinoza is a 46 y.o. female who presents to the Emergency Department complaining of back pain (most tender around perispinous T10&t11 area) that radiates down from the left lateral hip and travels through the interior thigh and the anterior tibia. Pt is ambulatory. Pt reports she was here after a fall a few months ago and has been having back pain intermittently ever since. Pt also reports she was working with her boyfriend on some sheet rock 2 days ago, after which the pain significantly worsened.  Pt reports she usually takes vicoden or percocet and flexeril for these issues.  Dr. Milford Cage is her PCP but he is out of town until next week.    Past Medical History  Diagnosis Date  . Polycystic ovarian syndrome   . Colitis due to Clostridium difficile 2001  . Hepatic hemangioma   . Anxiety   . IBS (irritable bowel syndrome)   . Salmonella   . GERD (gastroesophageal reflux disease)   . CIS (carcinoma in situ)     rectum  . Migraine   . Bipolar 1 disorder   . Migraine headache   . Cancer     Past Surgical History  Procedure Date  . Cholecystectomy   . Colonoscopy 05/05/2007    adenoma  . Situ removed age 68    High-grade rectal adenoma removed from rectum   . Dilation and curettage of uterus     for vaginal bleeding    Family History  Problem Relation Age of Onset  . Colon cancer Father 81  . Diverticulitis Mother     History  Substance Use Topics  . Smoking status:  Current Everyday Smoker -- 0.5 packs/day for 18 years    Types: Cigarettes  . Smokeless tobacco: Not on file  . Alcohol Use: No    OB History    Grav Para Term Preterm Abortions TAB SAB Ect Mult Living                  Review of Systems  Musculoskeletal: Positive for back pain.  All other systems reviewed and are negative.    Allergies  Shellfish allergy; Codeine; and Latex  Home Medications   Current Outpatient Rx  Name Route Sig Dispense Refill  . ALBUTEROL SULFATE HFA 108 (90 BASE) MCG/ACT IN AERS Inhalation Inhale 2 puffs into the lungs every 6 (six) hours as needed. For shortness of breath    . DIVALPROEX SODIUM ER 500 MG PO TB24 Oral Take 500 mg by mouth daily as needed. Migraine    . RANITIDINE HCL 150 MG PO TABS Oral Take 150 mg by mouth at bedtime as needed. Acid Reflux    . EPIPEN IJ Injection Inject as directed as needed. Allergic Reaction      Triage Vitals: BP 124/80  Pulse 68  Temp 98.5 F (36.9 C) (Oral)  Resp 20  Ht 5\' 1"  (1.549 m)  Wt 210 lb (95.255 kg)  BMI 39.68 kg/m2  SpO2 100%  LMP 11/12/2011  Physical Exam  Nursing note and vitals reviewed. Constitutional: She is oriented to person, place, and time. She appears well-developed and well-nourished.  HENT:  Head: Normocephalic and atraumatic.  Eyes: Conjunctivae and EOM are normal. Pupils are equal, round, and reactive to light.  Neck: Normal range of motion. Neck supple.  Cardiovascular: Normal rate and regular rhythm.   Pulmonary/Chest: Effort normal and breath sounds normal.  Abdominal: Soft. Bowel sounds are normal.  Musculoskeletal: Normal range of motion.       Tender around perispinous T10 and T11 area and from lateral left hip through the interior thigh and the anterior tibia.  Neurological: She is alert and oriented to person, place, and time.  Skin: Skin is warm and dry.  Psychiatric: She has a normal mood and affect.    ED Course  Procedures (including critical care  time) DIAGNOSTIC STUDIES: Oxygen Saturation is 100% on room air, normal by my interpretation.    COORDINATION OF CARE: 15:44--I evaluated the patient and we discussed a treatment plan including Vicodin and flexeril to which the pt agreed. I suggested that the pt find a back brace at the local pharmacy to assist with her back pain.   Labs Reviewed - No data to display No results found.   No diagnosis found.    MDM  Patient is ambulatory without neuro deficits. May have radicular component.  No bowel or bladder incontinence. Rx Vicodin #20 Flexeril #20      I personally performed the services described in this documentation, which was scribed in my presence. The recorded information has been reviewed and considered.    Martha Hutching, MD 11/12/11 [phone]

## 2011-11-12 NOTE — ED Notes (Signed)
Back pain , upper and lower back, with pain down both legs.  Increased pain with movement. No known injury recently

## 2011-12-13 ENCOUNTER — Emergency Department (HOSPITAL_COMMUNITY)
Admission: EM | Admit: 2011-12-13 | Discharge: 2011-12-13 | Disposition: A | Discharge status: Home / Self Care | Payer: BC Managed Care – PPO | Attending: Emergency Medicine | Admitting: Emergency Medicine

## 2011-12-13 ENCOUNTER — Encounter (HOSPITAL_COMMUNITY): Payer: Self-pay | Admitting: *Deleted

## 2011-12-13 ENCOUNTER — Emergency Department (HOSPITAL_COMMUNITY): Payer: BC Managed Care – PPO

## 2011-12-13 DIAGNOSIS — M659 Unspecified synovitis and tenosynovitis, unspecified site: Secondary | ICD-10-CM | POA: Insufficient documentation

## 2011-12-13 DIAGNOSIS — R51 Headache: Secondary | ICD-10-CM | POA: Insufficient documentation

## 2011-12-13 DIAGNOSIS — M6510 Other infective (teno)synovitis, unspecified site: Secondary | ICD-10-CM

## 2011-12-13 DIAGNOSIS — IMO0002 Reserved for concepts with insufficient information to code with codable children: Secondary | ICD-10-CM | POA: Insufficient documentation

## 2011-12-13 DIAGNOSIS — R109 Unspecified abdominal pain: Secondary | ICD-10-CM | POA: Insufficient documentation

## 2011-12-13 LAB — CBC WITH DIFFERENTIAL/PLATELET
Basophils Absolute: 0 10*3/uL (ref 0.0–0.1)
Basophils Relative: 0 % (ref 0–1)
Eosinophils Absolute: 0.1 10*3/uL (ref 0.0–0.7)
Eosinophils Relative: 1 % (ref 0–5)
HCT: 43.3 % (ref 36.0–46.0)
Hemoglobin: 14.5 g/dL (ref 12.0–15.0)
Lymphocytes Relative: 32 % (ref 12–46)
Lymphs Abs: 1.7 10*3/uL (ref 0.7–4.0)
MCH: 27.4 pg (ref 26.0–34.0)
MCHC: 33.5 g/dL (ref 30.0–36.0)
MCV: 81.9 fL (ref 78.0–100.0)
Monocytes Absolute: 0.4 10*3/uL (ref 0.1–1.0)
Monocytes Relative: 7 % (ref 3–12)
Neutro Abs: 3.1 10*3/uL (ref 1.7–7.7)
Neutrophils Relative %: 59 % (ref 43–77)
Platelets: 168 10*3/uL (ref 150–400)
RBC: 5.29 MIL/uL — ABNORMAL HIGH (ref 3.87–5.11)
RDW: 14.9 % (ref 11.5–15.5)
WBC: 5.2 10*3/uL (ref 4.0–10.5)

## 2011-12-13 LAB — URINALYSIS, ROUTINE W REFLEX MICROSCOPIC
Glucose, UA: NEGATIVE mg/dL
Ketones, ur: NEGATIVE mg/dL
Nitrite: NEGATIVE
Protein, ur: 30 mg/dL — AB
Specific Gravity, Urine: >1.03 — ABNORMAL HIGH (ref 1.005–1.030)
Urobilinogen, UA: 0.2 mg/dL (ref 0.0–1.0)
pH: 6 (ref 5.0–8.0)

## 2011-12-13 LAB — BASIC METABOLIC PANEL
BUN: 9 mg/dL (ref 6–23)
CO2: 24 mEq/L (ref 19–32)
Calcium: 9.4 mg/dL (ref 8.4–10.5)
Chloride: 103 mEq/L (ref 96–112)
Creatinine, Ser: 0.77 mg/dL (ref 0.50–1.10)
GFR calc Af Amer: >90 mL/min (ref >90)
GFR calc non Af Amer: >90 mL/min (ref >90)
Glucose, Bld: 101 mg/dL — ABNORMAL HIGH (ref 70–99)
Potassium: 3.5 mEq/L (ref 3.5–5.1)
Sodium: 138 mEq/L (ref 135–145)

## 2011-12-13 LAB — URINE MICROSCOPIC-ADD ON

## 2011-12-13 MED ORDER — CLINDAMYCIN PHOSPHATE 900 MG/50ML IV SOLN
900.0000 mg | Freq: Once | INTRAVENOUS | Status: AC
  Administered 2011-12-13: 900 mg via INTRAVENOUS
  Filled 2011-12-13: qty 50

## 2011-12-13 MED ORDER — ONDANSETRON HCL 4 MG/2ML IJ SOLN
4.0000 mg | Freq: Once | INTRAMUSCULAR | Status: AC
  Administered 2011-12-13: 4 mg via INTRAVENOUS
  Filled 2011-12-13: qty 2

## 2011-12-13 MED ORDER — CLINDAMYCIN PHOSPHATE 900 MG/6ML IV SOLN: 900.0000 mg | Freq: Once | INTRAVENOUS | Status: DC

## 2011-12-13 MED ORDER — ONDANSETRON HCL 4 MG PO TABS
4.0000 mg | ORAL_TABLET | Freq: Once | ORAL | Status: AC
  Administered 2011-12-13: 4 mg via ORAL
  Filled 2011-12-13: qty 1

## 2011-12-13 MED ORDER — HYDROMORPHONE HCL PF 1 MG/ML IJ SOLN
INTRAMUSCULAR | Status: AC
  Administered 2011-12-13: 1 mg via INTRAVENOUS
  Filled 2011-12-13: qty 1

## 2011-12-13 MED ORDER — ONDANSETRON HCL 4 MG/2ML IJ SOLN
INTRAMUSCULAR | Status: AC
  Administered 2011-12-13: 4 mg via INTRAVENOUS
  Filled 2011-12-13: qty 2

## 2011-12-13 MED ORDER — CIPROFLOXACIN HCL 500 MG PO TABS: 500.0000 mg | ORAL_TABLET | Freq: Two times a day (BID) | ORAL | Status: DC

## 2011-12-13 MED ORDER — SODIUM CHLORIDE 0.9 % IV SOLN
Freq: Once | INTRAVENOUS | Status: AC
  Administered 2011-12-13: 16:00:00 via INTRAVENOUS

## 2011-12-13 MED ORDER — OXYCODONE-ACETAMINOPHEN 5-325 MG PO TABS: ORAL_TABLET | ORAL | Status: DC

## 2011-12-13 MED ORDER — OXYCODONE-ACETAMINOPHEN 5-325 MG PO TABS
2.0000 | ORAL_TABLET | Freq: Once | ORAL | Status: AC
  Administered 2011-12-13: 2 via ORAL
  Filled 2011-12-13: qty 2

## 2011-12-13 MED ORDER — HYDROMORPHONE HCL PF 1 MG/ML IJ SOLN
1.0000 mg | Freq: Once | INTRAMUSCULAR | Status: AC
  Administered 2011-12-13: 1 mg via INTRAVENOUS

## 2011-12-13 MED ORDER — LEVOFLOXACIN IN D5W 500 MG/100ML IV SOLN
500.0000 mg | Freq: Once | INTRAVENOUS | Status: AC
  Administered 2011-12-13: 500 mg via INTRAVENOUS
  Filled 2011-12-13: qty 100

## 2011-12-13 MED ORDER — AMOXICILLIN 500 MG PO CAPS: ORAL_CAPSULE | ORAL | Status: DC

## 2011-12-13 NOTE — Consult Note (Signed)
Reason for Consult:finger pain Referring Physician: ER  Martha Espinoza is an 46 y.o. female.  HPI: She has had swelling of the right long finger tip for three days.  She stuck it with a pin yesterday.  It got worse.  It is red, hot very painful.  She has no other injury;  Past Medical History  Diagnosis Date  . Polycystic ovarian syndrome   . Colitis due to Clostridium difficile 2001  . Hepatic hemangioma   . Anxiety   . IBS (irritable bowel syndrome)   . Salmonella   . GERD (gastroesophageal reflux disease)   . CIS (carcinoma in situ)     rectum  . Migraine   . Bipolar 1 disorder   . Migraine headache   . Cancer     Past Surgical History  Procedure Date  . Cholecystectomy   . Colonoscopy 05/05/2007    adenoma  . Situ removed age 31    High-grade rectal adenoma removed from rectum   . Dilation and curettage of uterus     for vaginal bleeding  . Tubal ligation     Family History  Problem Relation Age of Onset  . Colon cancer Father 57  . Diverticulitis Mother     Social History:  reports that she has been smoking Cigarettes.  She has a 9 pack-year smoking history. She does not have any smokeless tobacco history on file. She reports that she does not drink alcohol or use illicit drugs.  Allergies:  Allergies  Allergen Reactions  . Shellfish Allergy Anaphylaxis  . Codeine Nausea And Vomiting    Hallucinations, Also sees people that are not there   . Latex Hives    Medications: I have reviewed the patient's current medications.  No results found for this or any previous visit (from the past 48 hour(s)).  No results found.  Review of Systems  Musculoskeletal: Positive for joint pain (She developed swelling of the right long finger three days ago.  She soaked it.  She stuck it with a needle.  It got red and swollen on the tip.).  All other systems reviewed and are negative.   Blood pressure 139/80, pulse 107, temperature 98.3 F (36.8 C), temperature source  Oral, resp. rate 20, height 5\' 1"  (1.549 m), weight 92.534 kg (204 lb), last menstrual period 11/12/2011, SpO2 99.00%. Physical Exam  Constitutional: She is oriented to person, place, and time. She appears well-developed and well-nourished.  HENT:  Head: Normocephalic and atraumatic.  Eyes: Conjunctivae normal and EOM are normal. Pupils are equal, round, and reactive to light.  Neck: Normal range of motion. Neck supple.  Cardiovascular: Normal rate, regular rhythm and intact distal pulses.   Respiratory: Effort normal and breath sounds normal.  GI: Soft. Bowel sounds are normal.  Musculoskeletal: She exhibits tenderness (Right long finger Felon is present with more radial swelling, pain and redness.  It is very tender.).       Hands: Neurological: She is alert and oriented to person, place, and time. She has normal reflexes.  Skin: Skin is warm and dry.  Psychiatric: She has a normal mood and affect. Her behavior is normal. Thought content normal.    Assessment/Plan: Felon of the right long finger.  Plan to incise and drain.  Martha Espinoza 12/13/2011, 4:24 PM

## 2011-12-13 NOTE — ED Provider Notes (Signed)
History     CSN: [id number]  Arrival date & time 12/13/11  1427   First MD Initiated Contact with Patient 12/13/11 1542      Chief Complaint  Patient presents with  . Hand Pain    (Consider location/radiation/quality/duration/timing/severity/associated sxs/prior treatment) HPI Comments: Patient is a 46 year old female who presents to the emergency department with complaint of pain of the right fourth finger. The patient states that approximately one week ago she was helping to see minute and outside area of her home with her significant other. She sustained a break in the fingernail and thinks that it may have gotten infected. When the pain continued to get worse she then lanced it and soaked it in Epsom salt water. The pain continued to get worse, the swelling continued to get worse. The patient also noted pain at the back of her hand and pain in her wrist and forearm when she moved her finger. She later noted that she could not flex her finger a way that she usually can flex her finger. She presents now for evaluation of this particular problem.  The history is provided by the patient.    Past Medical History  Diagnosis Date  . Polycystic ovarian syndrome   . Colitis due to Clostridium difficile 2001  . Hepatic hemangioma   . Anxiety   . IBS (irritable bowel syndrome)   . Salmonella   . GERD (gastroesophageal reflux disease)   . CIS (carcinoma in situ)     rectum  . Migraine   . Bipolar 1 disorder   . Migraine headache   . Cancer     Past Surgical History  Procedure Date  . Cholecystectomy   . Colonoscopy 05/05/2007    adenoma  . Situ removed age 21    High-grade rectal adenoma removed from rectum   . Dilation and curettage of uterus     for vaginal bleeding  . Tubal ligation     Family History  Problem Relation Age of Onset  . Colon cancer Father 87  . Diverticulitis Mother     History  Substance Use Topics  . Smoking status: Current Every Day Smoker -- 0.5  packs/day for 18 years    Types: Cigarettes  . Smokeless tobacco: Not on file  . Alcohol Use: No    OB History    Grav Para Term Preterm Abortions TAB SAB Ect Mult Living                  Review of Systems  Constitutional: Negative for activity change.       All ROS Neg except as noted in HPI  HENT: Negative for nosebleeds and neck pain.   Eyes: Negative for photophobia and discharge.  Respiratory: Negative for cough, shortness of breath and wheezing.   Cardiovascular: Negative for chest pain and palpitations.  Gastrointestinal: Positive for abdominal pain. Negative for blood in stool.  Genitourinary: Positive for pelvic pain. Negative for dysuria, frequency and hematuria.  Musculoskeletal: Negative for back pain and arthralgias.  Skin: Negative.   Neurological: Positive for headaches. Negative for dizziness, seizures and speech difficulty.  Psychiatric/Behavioral: Negative for hallucinations and confusion. The patient is nervous/anxious.     Allergies  Shellfish allergy; Codeine; and Latex  Home Medications   Current Outpatient Rx  Name Route Sig Dispense Refill  . ALBUTEROL SULFATE HFA 108 (90 BASE) MCG/ACT IN AERS Inhalation Inhale 2 puffs into the lungs every 6 (six) hours as needed. For shortness of breath    .  DIVALPROEX SODIUM ER 500 MG PO TB24 Oral Take 500 mg by mouth daily as needed. Migraine    . RANITIDINE HCL 150 MG PO TABS Oral Take 150 mg by mouth at bedtime as needed. Acid Reflux      BP 139/80  Pulse 107  Temp 98.3 F (36.8 C) (Oral)  Resp 20  Ht 5\' 1"  (1.549 m)  Wt 204 lb (92.534 kg)  BMI 38.55 kg/m2  SpO2 99%  LMP 11/12/2011  Physical Exam  Nursing note and vitals reviewed. Constitutional: She is oriented to person, place, and time. She appears well-developed and well-nourished.  Non-toxic appearance.  HENT:  Head: Normocephalic.  Right Ear: Tympanic membrane and external ear normal.  Left Ear: Tympanic membrane and external ear normal.    Eyes: EOM and lids are normal. Pupils are equal, round, and reactive to light.  Neck: Normal range of motion. Neck supple. Carotid bruit is not present.  Cardiovascular: Regular rhythm, normal heart sounds, intact distal pulses and normal pulses.  Tachycardia present.   Pulmonary/Chest: Breath sounds normal. No respiratory distress.  Abdominal: Soft. Bowel sounds are normal. There is no tenderness. There is no guarding.  Musculoskeletal:       There is full range of motion of the right shoulder and elbow. There is a broken skin area of the distal portion of the right fourth finger. There is a pus filled blister at the palmar surface of the distal portion of the right fourth finger. The right fourth finger is held in mild flexion. The patient is very resistant to attempt to extend the finger because of pain. Passive movement of the right fourth finger causes pain at the MP joint area as well as the dorsal portion of the right wrist forearm area. There  Is swelling and tenderness and warmth of the palmar surface of the 4th finger.   There is also some increased redness of the right fourth MP area and what appears to be some red streaking extending into the right wrist forearm area. There no palpable nodes in the axillary area.  Lymphadenopathy:       Head (right side): No submandibular adenopathy present.       Head (left side): No submandibular adenopathy present.    She has no cervical adenopathy.  Neurological: She is alert and oriented to person, place, and time. She has normal strength. No cranial nerve deficit or sensory deficit.  Skin: Skin is warm and dry.  Psychiatric: Her speech is normal. Her mood appears anxious.    ED Course  Procedures (including critical care time)   Labs Reviewed  CBC WITH DIFFERENTIAL  BASIC METABOLIC PANEL  URINALYSIS, ROUTINE W REFLEX MICROSCOPIC   No results found.   No diagnosis found.    MDM  I have reviewed nursing notes, vital signs, and all  appropriate lab and imaging results for this patient. The patient is noted to have 4 out of 4 of the [place] sign. There is also question of a felon being present. The patient was seen with me By Dr Rhunette Croft. Dr. Hilda Lias, the orthopedic surgeon, was in the emergency department and agreed to see the patient. It was his opinion that the patient indeed had a felon, and he drained the area and put a packing in the fourth finger.  Patient has been treated with IV clindamycin, and IV levofloxacin. Dr. Hilda Lias will follow the patient in the office as an outpatient Sept 20, at 8:30am. Rx for cipro, Amoxicillin, and Percocet given  to the patient. (pt request that meds be from the $4.00 list at [facility].).       Kathie Dike, [place] 12/13/11 [phone]

## 2011-12-13 NOTE — ED Provider Notes (Signed)
Medical screening examination/treatment/procedure(s) were conducted as a shared visit with non-physician practitioner(s) and myself.  I personally evaluated the patient during the encounter  Pt comes in with right hand finger pain. Recent penetrating injury that she was managing at home. Exam shows swelling, mild contraction, tenderness with passive extension - and so Hand has been consulted, IV AB started. Otherwise patient is non toxic, and immunocompetent.  Derwood Kaplan, MD 12/13/11 2051

## 2011-12-13 NOTE — ED Notes (Signed)
RMF  Infection  Says she has lanced it at home. No known injury

## 2012-03-25 ENCOUNTER — Encounter: Payer: Self-pay | Admitting: *Deleted

## 2012-09-06 ENCOUNTER — Encounter (HOSPITAL_COMMUNITY): Payer: Self-pay

## 2012-09-06 ENCOUNTER — Emergency Department (HOSPITAL_COMMUNITY)
Admission: EM | Admit: 2012-09-06 | Discharge: 2012-09-06 | Disposition: A | Discharge status: Home / Self Care | Payer: Self-pay | Attending: Emergency Medicine | Admitting: Emergency Medicine

## 2012-09-06 ENCOUNTER — Emergency Department (HOSPITAL_COMMUNITY): Payer: Self-pay

## 2012-09-06 DIAGNOSIS — R0602 Shortness of breath: Secondary | ICD-10-CM | POA: Insufficient documentation

## 2012-09-06 DIAGNOSIS — Z8719 Personal history of other diseases of the digestive system: Secondary | ICD-10-CM | POA: Insufficient documentation

## 2012-09-06 DIAGNOSIS — R079 Chest pain, unspecified: Secondary | ICD-10-CM

## 2012-09-06 DIAGNOSIS — F411 Generalized anxiety disorder: Secondary | ICD-10-CM | POA: Insufficient documentation

## 2012-09-06 DIAGNOSIS — Z8639 Personal history of other endocrine, nutritional and metabolic disease: Secondary | ICD-10-CM | POA: Insufficient documentation

## 2012-09-06 DIAGNOSIS — R059 Cough, unspecified: Secondary | ICD-10-CM | POA: Insufficient documentation

## 2012-09-06 DIAGNOSIS — Z91040 Latex allergy status: Secondary | ICD-10-CM | POA: Insufficient documentation

## 2012-09-06 DIAGNOSIS — Z8619 Personal history of other infectious and parasitic diseases: Secondary | ICD-10-CM | POA: Insufficient documentation

## 2012-09-06 DIAGNOSIS — M7989 Other specified soft tissue disorders: Secondary | ICD-10-CM | POA: Insufficient documentation

## 2012-09-06 DIAGNOSIS — F319 Bipolar disorder, unspecified: Secondary | ICD-10-CM | POA: Insufficient documentation

## 2012-09-06 DIAGNOSIS — F17200 Nicotine dependence, unspecified, uncomplicated: Secondary | ICD-10-CM | POA: Insufficient documentation

## 2012-09-06 DIAGNOSIS — R05 Cough: Secondary | ICD-10-CM | POA: Insufficient documentation

## 2012-09-06 DIAGNOSIS — R0789 Other chest pain: Secondary | ICD-10-CM | POA: Insufficient documentation

## 2012-09-06 DIAGNOSIS — Z79899 Other long term (current) drug therapy: Secondary | ICD-10-CM | POA: Insufficient documentation

## 2012-09-06 DIAGNOSIS — K219 Gastro-esophageal reflux disease without esophagitis: Secondary | ICD-10-CM | POA: Insufficient documentation

## 2012-09-06 DIAGNOSIS — Z862 Personal history of diseases of the blood and blood-forming organs and certain disorders involving the immune mechanism: Secondary | ICD-10-CM | POA: Insufficient documentation

## 2012-09-06 LAB — HEPATIC FUNCTION PANEL
ALT: 8 U/L (ref 0–35)
AST: 8 U/L (ref 0–37)
Albumin: 3.3 g/dL — ABNORMAL LOW (ref 3.5–5.2)
Alkaline Phosphatase: 48 U/L (ref 39–117)
Bilirubin, Direct: <0.1 mg/dL (ref 0.0–0.3)
Total Bilirubin: 0.3 mg/dL (ref 0.3–1.2)
Total Protein: 6.6 g/dL (ref 6.0–8.3)

## 2012-09-06 LAB — TROPONIN I
Troponin I: <0.3 ng/mL (ref <0.30)
Troponin I: <0.3 ng/mL (ref <0.30)

## 2012-09-06 LAB — CBC WITH DIFFERENTIAL/PLATELET
Basophils Absolute: 0 10*3/uL (ref 0.0–0.1)
Basophils Relative: 0 % (ref 0–1)
Eosinophils Absolute: 0.1 10*3/uL (ref 0.0–0.7)
Eosinophils Relative: 1 % (ref 0–5)
HCT: 39.4 % (ref 36.0–46.0)
Hemoglobin: 14 g/dL (ref 12.0–15.0)
Lymphocytes Relative: 35 % (ref 12–46)
Lymphs Abs: 3.5 10*3/uL (ref 0.7–4.0)
MCH: 29.9 pg (ref 26.0–34.0)
MCHC: 35.5 g/dL (ref 30.0–36.0)
MCV: 84.2 fL (ref 78.0–100.0)
Monocytes Absolute: 0.4 10*3/uL (ref 0.1–1.0)
Monocytes Relative: 4 % (ref 3–12)
Neutro Abs: 5.8 10*3/uL (ref 1.7–7.7)
Neutrophils Relative %: 59 % (ref 43–77)
Platelets: 268 10*3/uL (ref 150–400)
RBC: 4.68 MIL/uL (ref 3.87–5.11)
RDW: 14.9 % (ref 11.5–15.5)
WBC: 9.8 10*3/uL (ref 4.0–10.5)

## 2012-09-06 LAB — BASIC METABOLIC PANEL
BUN: 10 mg/dL (ref 6–23)
CO2: 23 mEq/L (ref 19–32)
Calcium: 8.9 mg/dL (ref 8.4–10.5)
Chloride: 102 mEq/L (ref 96–112)
Creatinine, Ser: 0.7 mg/dL (ref 0.50–1.10)
GFR calc Af Amer: >90 mL/min (ref >90)
GFR calc non Af Amer: >90 mL/min (ref >90)
Glucose, Bld: 115 mg/dL — ABNORMAL HIGH (ref 70–99)
Potassium: 3.6 mEq/L (ref 3.5–5.1)
Sodium: 136 mEq/L (ref 135–145)

## 2012-09-06 LAB — LIPASE, BLOOD: Lipase: 28 U/L (ref 11–59)

## 2012-09-06 MED ORDER — NITROGLYCERIN 0.4 MG SL SUBL
0.4000 mg | SUBLINGUAL_TABLET | SUBLINGUAL | Status: DC | PRN
  Administered 2012-09-06 (×2): 0.4 mg via SUBLINGUAL
  Filled 2012-09-06: qty 25

## 2012-09-06 MED ORDER — ASPIRIN 81 MG PO CHEW: 81.0000 mg | CHEWABLE_TABLET | Freq: Every day | ORAL | Status: AC

## 2012-09-06 MED ORDER — GI COCKTAIL ~~LOC~~
30.0000 mL | Freq: Once | ORAL | Status: AC
  Administered 2012-09-06: 30 mL via ORAL
  Filled 2012-09-06: qty 30

## 2012-09-06 MED ORDER — ASPIRIN 325 MG PO TABS
325.0000 mg | ORAL_TABLET | Freq: Once | ORAL | Status: AC
  Administered 2012-09-06: 325 mg via ORAL
  Filled 2012-09-06: qty 1

## 2012-09-06 MED ORDER — SODIUM CHLORIDE 0.9 % IV BOLUS (SEPSIS)
500.0000 mL | Freq: Once | INTRAVENOUS | Status: AC
  Administered 2012-09-06: 500 mL via INTRAVENOUS

## 2012-09-06 NOTE — ED Notes (Signed)
nad noted prior to dc. Dc instructions reviewed and 1 script given. F/u care instructed . Ambulated out without difficulty.

## 2012-09-06 NOTE — ED Notes (Signed)
C/o mid center chest pain ache in nature- intermittent per pt.

## 2012-09-06 NOTE — ED Provider Notes (Signed)
History  This chart was scribed for Joya Gaskins, MD by Greggory Stallion, ED Scribe. This patient was seen in room APA05/APA05 and the patient's care was started at 10:40 AM.  CSN: [id number]  Arrival date & time 09/06/12  1031    Chief Complaint  Patient presents with  . Chest Pain    Patient is a 47 y.o. female presenting with chest pain. The history is provided by the patient. No language interpreter was used.  Chest Pain Pain location:  Substernal area and R chest Pain quality: aching   Pain radiates to the back: yes   Pain severity:  Moderate Onset quality:  Gradual Duration:  4 days Timing:  Constant Progression:  Unchanged Chronicity:  New Relieved by:  None tried Worsened by:  Nothing tried Ineffective treatments:  None tried Associated symptoms: shortness of breath     HPI Comments: Martha Espinoza is a 47 y.o. female who presents to the Emergency Department complaining of gradual onset, constant substernal and right sided chest pain that radiates to her back with associated SOB that started 4 days ago and it is occurring 24/7. She rates her pain 7/10. Pt states the SOB got worse last night. She states she started coughing last night. She states nothing makes it better or worse. Pt states two nights ago one of her legs got swollen but it's better now. Pt denies fever, neck pain, sore throat, visual disturbance,  nausea, emesis, diarrhea, urinary symptoms, back pain, HA, weakness, numbness and rash as associated symptoms. Pt states she smokes about 0.5 packs of cigarettes a day.   Past Medical History  Diagnosis Date  . Polycystic ovarian syndrome   . Colitis due to Clostridium difficile 2001  . Hepatic hemangioma   . Anxiety   . IBS (irritable bowel syndrome)   . Salmonella   . GERD (gastroesophageal reflux disease)   . CIS (carcinoma in situ)     rectum  . Migraine   . Bipolar 1 disorder   . Migraine headache   . Cancer     Past Surgical History   Procedure Laterality Date  . Cholecystectomy    . Colonoscopy  05/05/2007    adenoma  . Situ removed  age 89    High-grade rectal adenoma removed from rectum   . Dilation and curettage of uterus      for vaginal bleeding  . Tubal ligation      Family History  Problem Relation Age of Onset  . Colon cancer Father 52  . Diverticulitis Mother     History  Substance Use Topics  . Smoking status: Current Every Day Smoker -- 0.50 packs/day for 18 years    Types: Cigarettes  . Smokeless tobacco: Not on file  . Alcohol Use: No    OB History   Grav Para Term Preterm Abortions TAB SAB Ect Mult Living                  Review of Systems  Respiratory: Positive for shortness of breath.   Cardiovascular: Positive for chest pain.  All other systems reviewed and are negative.    Allergies  Shellfish allergy; Codeine; and Latex  Home Medications   Current Outpatient Rx  Name  Route  Sig  Dispense  Refill  . albuterol (PROVENTIL HFA) 108 (90 BASE) MCG/ACT inhaler   Inhalation   Inhale 2 puffs into the lungs every 6 (six) hours as needed. For shortness of breath         .  amoxicillin (AMOXIL) 500 MG capsule      2 po bid with food   28 capsule   0   . ciprofloxacin (CIPRO) 500 MG tablet   Oral   Take 1 tablet (500 mg total) by mouth every 12 (twelve) hours.   12 tablet   0   . divalproex (DEPAKOTE ER) 500 MG 24 hr tablet   Oral   Take 500 mg by mouth daily as needed. Migraine         . oxyCODONE-acetaminophen (PERCOCET/ROXICET) 5-325 MG per tablet      1 or 2 po q6h prn pain   24 tablet   0   . ranitidine (ZANTAC) 150 MG tablet   Oral   Take 150 mg by mouth at bedtime as needed. Acid Reflux           BP 118/75  Pulse 63  Temp(Src) 97.4 F (36.3 C) (Oral)  Resp 18  Ht 5\' 1"  (1.549 m)  Wt 197 lb (89.359 kg)  BMI 37.24 kg/m2  SpO2 97%  LMP 08/12/2012  Physical Exam  CONSTITUTIONAL: Well developed/well nourished HEAD:  Normocephalic/atraumatic EYES: EOMI/PERRL ENMT: Mucous membranes moist NECK: supple no meningeal signs SPINE:entire spine nontender CV: S1/S2 noted, no murmurs/rubs/gallops noted LUNGS: Lungs are clear to auscultation bilaterally, no apparent distress ABDOMEN: soft, nontender, no rebound or guarding GU:no cva tenderness NEURO: Pt is awake/alert, moves all extremitiesx4 EXTREMITIES: pulses normal, full ROM, no calf tenderness or edema SKIN: warm, color normal PSYCH: no abnormalities of mood noted  ED Course  Procedures  DIAGNOSTIC STUDIES: Oxygen Saturation is 97% on RA, normal by my interpretation.    COORDINATION OF CARE: 11:01 AM-Discussed treatment plan with pt at bedside and pt agreed to plan.   Labs Reviewed  BASIC METABOLIC PANEL - Abnormal; Notable for the following:    Glucose, Bld 115 (*)    All other components within normal limits  HEPATIC FUNCTION PANEL - Abnormal; Notable for the following:    Albumin 3.3 (*)    All other components within normal limits  CBC WITH DIFFERENTIAL  TROPONIN I  LIPASE, BLOOD   Dg Chest 2 View  09/06/2012   *RADIOLOGY REPORT*  Clinical Data: Chest pain  CHEST - 2 VIEW  Comparison: Prior chest x-ray 06/18/2011  Findings: No focal airspace consolidation, pulmonary edema, pleural effusion or pneumothorax.  Cardiac and mediastinal contours remain within normal limits.  Trace bibasilar subsegmental atelectasis. No acute osseous abnormality. Surgical clips in the right upper quadrant suggest prior cholecystectomy.  IMPRESSION: Trace bibasilar subsegmental atelectasis.  Otherwise, no acute cardiopulmonary disease.   Original Report Authenticated By: Malachy Moan, M.D.   Pt monitored for several hours Her repeat EKG/Troponin were negative and her pain level was nearly zero She was low risk from Cardiac standpoint (family history and smoker only) I doubt she has acute PE or aortic dissection given her appearance and clinical stability She  had some atypical features and during her course her story changed (later denied CP radiated to back) also her pain changed locations (at one point it was left sided, later right sided) I did offer admission for further evaluation of her chest pain She preferred to go home.  I did inform her of limitations of EKG/troponin testing only and she understands She will f/u as outpatient and may eventually need potential stress imaging      MDM  Nursing notes including past medical history and social history reviewed and considered in documentation xrays reviewed and considered  Labs/vital reviewed and considered     Date: 09/06/2012 1039am  Rate: 63  Rhythm: normal sinus rhythm  QRS Axis: normal  Intervals: normal  ST/T Wave abnormalities: nonspecific ST changes  Conduction Disutrbances:none  Narrative Interpretation:   Old EKG Reviewed: unchanged from 2013      Date: 09/06/2012 1356  Rate: 53  Rhythm: sinus bradycardia  QRS Axis: normal  Intervals: normal  ST/T Wave abnormalities: normal  Conduction Disutrbances:none  Narrative Interpretation:   Old EKG Reviewed: unchanged from prior      I personally performed the services described in this documentation, which was scribed in my presence. The recorded information has been reviewed and is accurate.          Joya Gaskins, MD 09/06/12 1524

## 2012-09-06 NOTE — ED Notes (Signed)
Coke given to pt.  

## 2012-09-06 NOTE — ED Notes (Signed)
Pt c/o pain in center and r side of chest radiating through to back x 3 or 4 days.  Describes as a constant ache.  Denies anything making it better or worse.  C/O SOB, no n/v.

## 2012-11-02 ENCOUNTER — Encounter (HOSPITAL_COMMUNITY): Payer: Self-pay | Admitting: Emergency Medicine

## 2012-11-02 ENCOUNTER — Emergency Department (HOSPITAL_COMMUNITY): Payer: Self-pay

## 2012-11-02 ENCOUNTER — Emergency Department (HOSPITAL_COMMUNITY)
Admission: EM | Admit: 2012-11-02 | Discharge: 2012-11-02 | Disposition: A | Discharge status: Home / Self Care | Payer: Self-pay | Attending: Emergency Medicine | Admitting: Emergency Medicine

## 2012-11-02 DIAGNOSIS — W19XXXA Unspecified fall, initial encounter: Secondary | ICD-10-CM

## 2012-11-02 DIAGNOSIS — F411 Generalized anxiety disorder: Secondary | ICD-10-CM | POA: Insufficient documentation

## 2012-11-02 DIAGNOSIS — Z8719 Personal history of other diseases of the digestive system: Secondary | ICD-10-CM | POA: Insufficient documentation

## 2012-11-02 DIAGNOSIS — R3129 Other microscopic hematuria: Secondary | ICD-10-CM | POA: Insufficient documentation

## 2012-11-02 DIAGNOSIS — W010XXA Fall on same level from slipping, tripping and stumbling without subsequent striking against object, initial encounter: Secondary | ICD-10-CM | POA: Insufficient documentation

## 2012-11-02 DIAGNOSIS — Z85048 Personal history of other malignant neoplasm of rectum, rectosigmoid junction, and anus: Secondary | ICD-10-CM | POA: Insufficient documentation

## 2012-11-02 DIAGNOSIS — F319 Bipolar disorder, unspecified: Secondary | ICD-10-CM | POA: Insufficient documentation

## 2012-11-02 DIAGNOSIS — S300XXA Contusion of lower back and pelvis, initial encounter: Secondary | ICD-10-CM

## 2012-11-02 DIAGNOSIS — F17200 Nicotine dependence, unspecified, uncomplicated: Secondary | ICD-10-CM | POA: Insufficient documentation

## 2012-11-02 DIAGNOSIS — IMO0002 Reserved for concepts with insufficient information to code with codable children: Secondary | ICD-10-CM | POA: Insufficient documentation

## 2012-11-02 DIAGNOSIS — S20229A Contusion of unspecified back wall of thorax, initial encounter: Secondary | ICD-10-CM | POA: Insufficient documentation

## 2012-11-02 DIAGNOSIS — Y9389 Activity, other specified: Secondary | ICD-10-CM | POA: Insufficient documentation

## 2012-11-02 DIAGNOSIS — M545 Low back pain, unspecified: Secondary | ICD-10-CM

## 2012-11-02 DIAGNOSIS — Z7982 Long term (current) use of aspirin: Secondary | ICD-10-CM | POA: Insufficient documentation

## 2012-11-02 DIAGNOSIS — Y92009 Unspecified place in unspecified non-institutional (private) residence as the place of occurrence of the external cause: Secondary | ICD-10-CM | POA: Insufficient documentation

## 2012-11-02 DIAGNOSIS — Z8619 Personal history of other infectious and parasitic diseases: Secondary | ICD-10-CM | POA: Insufficient documentation

## 2012-11-02 DIAGNOSIS — Z8742 Personal history of other diseases of the female genital tract: Secondary | ICD-10-CM | POA: Insufficient documentation

## 2012-11-02 DIAGNOSIS — Z79899 Other long term (current) drug therapy: Secondary | ICD-10-CM | POA: Insufficient documentation

## 2012-11-02 DIAGNOSIS — Z91040 Latex allergy status: Secondary | ICD-10-CM | POA: Insufficient documentation

## 2012-11-02 DIAGNOSIS — Z8679 Personal history of other diseases of the circulatory system: Secondary | ICD-10-CM | POA: Insufficient documentation

## 2012-11-02 LAB — URINE MICROSCOPIC-ADD ON

## 2012-11-02 LAB — URINALYSIS, ROUTINE W REFLEX MICROSCOPIC
Bilirubin Urine: NEGATIVE
Glucose, UA: NEGATIVE mg/dL
Ketones, ur: NEGATIVE mg/dL
Leukocytes, UA: NEGATIVE
Nitrite: NEGATIVE
Protein, ur: NEGATIVE mg/dL
Specific Gravity, Urine: >1.03 — ABNORMAL HIGH (ref 1.005–1.030)
Urobilinogen, UA: 0.2 mg/dL (ref 0.0–1.0)
pH: 6 (ref 5.0–8.0)

## 2012-11-02 MED ORDER — DIAZEPAM 5 MG/ML IJ SOLN
5.0000 mg | Freq: Once | INTRAMUSCULAR | Status: AC
  Administered 2012-11-02: 5 mg via INTRAMUSCULAR
  Filled 2012-11-02: qty 2

## 2012-11-02 MED ORDER — CYCLOBENZAPRINE HCL 10 MG PO TABS: 10.0000 mg | ORAL_TABLET | Freq: Three times a day (TID) | ORAL | Status: DC | PRN

## 2012-11-02 MED ORDER — TRAMADOL HCL 50 MG PO TABS: 100.0000 mg | ORAL_TABLET | Freq: Four times a day (QID) | ORAL | Status: DC | PRN

## 2012-11-02 MED ORDER — KETOROLAC TROMETHAMINE 60 MG/2ML IM SOLN
60.0000 mg | Freq: Once | INTRAMUSCULAR | Status: AC
  Administered 2012-11-02: 60 mg via INTRAMUSCULAR
  Filled 2012-11-02: qty 2

## 2012-11-02 NOTE — ED Provider Notes (Signed)
CSN: [id number]     Arrival date & time 11/02/12  0703 History  This chart was scribed for Ward Givens, MD by Greggory Stallion, ED Scribe. This patient was seen in room APA18/APA18 and the patient's care was started at 7:20 AM.   Chief Complaint  Patient presents with  . Fall  . Back Pain   The history is provided by the patient. No language interpreter was used.    HPI Comments: Martha Espinoza is a 47 y.o. female who presents to the Emergency Department complaining of a fall that occurred 2 days ago. Pt states that she slipped going down wet steps while wearing flip flops and landed on her buttocks and back and is now experiencing gradual onset, constant lower back pain that radiates up towards her neck. She denies hitting her head or LOC. Pt states sitting up and laying down on the tender areas worsens the pain. She thinks she may have tightened the muscles in her abdomen when she fell causing some abdominal pain in her left lower abdomen. Pt also fell last week on the same slippery steps and landed the same way. Pt has taken ibuprofen and hot baths with no relief. She denies headache and hematuria as associated symptoms. She c/o a knot in her right posterior chest that is very sore to touch and when she lays on it. LNMP was July 19.    Pt smokes less than one pack of cigarettes per day. She denies alcohol use.   PCP: None  Past Medical History  Diagnosis Date  . Polycystic ovarian syndrome   . Colitis due to Clostridium difficile 2001  . Hepatic hemangioma   . Anxiety   . IBS (irritable bowel syndrome)   . Salmonella   . GERD (gastroesophageal reflux disease)   . CIS (carcinoma in situ)     rectum  . Migraine   . Bipolar 1 disorder   . Migraine headache   . Cancer    Past Surgical History  Procedure Laterality Date  . Cholecystectomy    . Colonoscopy  05/05/2007    adenoma  . Situ removed  age 64    High-grade rectal adenoma removed from rectum   . Dilation and curettage of  uterus      for vaginal bleeding  . Tubal ligation     Family History  Problem Relation Age of Onset  . Colon cancer Father 75  . Diverticulitis Mother    History  Substance Use Topics  . Smoking status: Current Every Day Smoker -- 0.50 packs/day for 18 years    Types: Cigarettes  . Smokeless tobacco: Not on file  . Alcohol Use: No   unemployed  OB History   Grav Para Term Preterm Abortions TAB SAB Ect Mult Living                 Review of Systems  HENT: Positive for neck pain.   Genitourinary: Negative for hematuria.  Musculoskeletal: Positive for back pain.  Neurological: Negative for headaches.  All other systems reviewed and are negative.    Allergies  Shellfish allergy; Codeine; and Latex  Home Medications   Current Outpatient Rx  Name  Route  Sig  Dispense  Refill  . aspirin 81 MG chewable tablet   Oral   Chew 1 tablet (81 mg total) by mouth daily.   14 tablet   0   . busPIRone (BUSPAR) 5 MG tablet   Oral   Take 5 mg  by mouth 3 (three) times daily.         Marland Kitchen FLUoxetine (PROZAC) 20 MG capsule   Oral   Take 20 mg by mouth daily.         . Pseudoephedrine-APAP-DM (DAYQUIL PO)   Oral   Take 1 each by mouth daily as needed (congestion).           BP 113/80  Pulse 74  Temp(Src) 97.7 F (36.5 C) (Oral)  Resp 28  Wt 187 lb (84.823 kg)  BMI 35.35 kg/m2  SpO2 98%  LMP 10/11/2012  Vital signs normal    Physical Exam  Nursing note and vitals reviewed. Constitutional: She is oriented to person, place, and time. She appears well-developed and well-nourished.  Non-toxic appearance. She does not appear ill. No distress.  HENT:  Head: Normocephalic and atraumatic.  Right Ear: External ear normal.  Left Ear: External ear normal.  Nose: Nose normal. No mucosal edema or rhinorrhea.  Mouth/Throat: Oropharynx is clear and moist and mucous membranes are normal. No dental abscesses or edematous.  Head nontender  Eyes: Conjunctivae and EOM are  normal. Pupils are equal, round, and reactive to light.  Neck: Normal range of motion and full passive range of motion without pain. Neck supple.  Tender on left trapezius muscle.  Cardiovascular: Normal rate, regular rhythm and normal heart sounds.  Exam reveals no gallop and no friction rub.   No murmur heard. Pulmonary/Chest: Effort normal and breath sounds normal. No respiratory distress. She has no wheezes. She has no rhonchi. She has no rales. She exhibits no tenderness and no crepitus.  Abdominal: Soft. Normal appearance and bowel sounds are normal. She exhibits no distension. There is tenderness. There is no rebound and no guarding.    Tenderness in left abdomen lateral to umbilicus. No hernia felt with straining.   Musculoskeletal: Normal range of motion. She exhibits no edema and no tenderness.       Back:  Moves all extremities well. Tenderness on lower thoracic/lumbar junction and lumbar region of spine. Faint linear bruising and tenderness on right lower posterior rib cage. Tender in coccyx.   Neurological: She is alert and oriented to person, place, and time. She has normal strength. No cranial nerve deficit.  Skin: Skin is warm, dry and intact. No rash noted. No erythema. No pallor.  Psychiatric: She has a normal mood and affect. Her speech is normal and behavior is normal. Her mood appears not anxious.    ED Course   Medications  ketorolac (TORADOL) injection 60 mg (60 mg Intramuscular Given 11/02/12 0750)  diazepam (VALIUM) injection 5 mg (5 mg Intramuscular Given 11/02/12 0750)     Procedures (including critical care time)  DIAGNOSTIC STUDIES: Oxygen Saturation is 98% on RA, normal by my interpretation.    COORDINATION OF CARE: 7:43 AM-Discussed treatment plan which includes pain medication and xray with pt at bedside and pt agreed to plan.   8:47 AM- Pt states her pain is better after medications. Alerted pt xray was negative and there was microscopic blood in her  urine. She denies having hematuria at home, her LNP was 7/19. Advised her to alternate cold and warm compresses and take a muscle relaxer.    Dg Ribs Unilateral W/chest Right  11/02/2012   *RADIOLOGY REPORT*  Clinical Data: Fall with chest and right rib pain.  RIGHT RIBS AND CHEST - 3+ VIEW  Comparison: 09/06/2012 chest radiograph  Findings: The cardiomediastinal silhouette is unremarkable. There is no evidence of  focal airspace disease, pulmonary edema, suspicious pulmonary nodule/mass, pleural effusion, or pneumothorax. No acute bony abnormalities are identified. No definite right rib abnormality is identified.  IMPRESSION: No evidence of acute cardiopulmonary disease.  No definite right rib abnormality.   Original Report Authenticated By: Harmon Pier, M.D.   Dg Lumbar Spine Complete  11/02/2012   *RADIOLOGY REPORT*  Clinical Data: Low back pain following fall  LUMBAR SPINE - COMPLETE 4+ VIEW  Comparison: None  Findings: Five non-rib bearing lumbar type vertebra are identified in normal alignment. There is no evidence of acute fracture or subluxation. The disc spaces are maintained. No focal bony lesions or spondylolysis noted.  IMPRESSION: No evidence of acute bony abnormality.   Original Report Authenticated By: Harmon Pier, M.D.   Dg Sacrum/coccyx  11/02/2012   *RADIOLOGY REPORT*  Clinical Data: Fall with sacrum/coccyx pain.  SACRUM AND COCCYX - 2+ VIEW  Comparison: None  Findings: No evidence of acute fracture, subluxation or dislocation identified.  No radio-opaque foreign bodies are present.  No focal bony lesions are noted.  The joint spaces are unremarkable.  IMPRESSION: No evidence of acute bony abnormality.   Original Report Authenticated By: Harmon Pier, M.D.     1. Fall at home, initial encounter   2. Contusion of coccyx, initial encounter   3. Back pain, lumbosacral   4. Microscopic hematuria      New Prescriptions   CYCLOBENZAPRINE (FLEXERIL) 10 MG TABLET    Take 1 tablet (10 mg  total) by mouth 3 (three) times daily as needed for muscle spasms.   TRAMADOL (ULTRAM) 50 MG TABLET    Take 2 tablets (100 mg total) by mouth every 6 (six) hours as needed.    Plan discharge   Devoria Albe, MD, FACEP   MDM  patient fell backwards going down steps and has bruising over her right posterior lower rib cage with microscopic hematuria. There was concern for hematoma to the kidney however the hematuria is not significant enough to pursue CT scan. It is doubtful that she has a significant injury such as a laceration of the kidney. She most likely has a small contusion that should heal uneventfully.    I personally performed the services described in this documentation, which was scribed in my presence. The recorded information has been reviewed and considered.  Devoria Albe, MD, Armando Gang   Ward Givens, MD 11/02/12 [phone]

## 2012-11-02 NOTE — ED Notes (Signed)
Patient with no complaints at this time. Respirations even and unlabored. Skin warm/dry. Discharge instructions reviewed with patient at this time. Patient given opportunity to voice concerns/ask questions. Patient discharged at this time and left Emergency Department with steady gait.   

## 2012-11-02 NOTE — ED Notes (Signed)
States that she fell Friday and landed on her buttocks, states that she is having pain in her lower back that radiates up towards her neck.  States that she fell last week as well.  States that her falls are related to slips on wet steps.

## 2012-11-02 NOTE — ED Notes (Signed)
MD at bedside. 

## 2013-01-15 ENCOUNTER — Encounter (HOSPITAL_COMMUNITY): Payer: Self-pay | Admitting: Emergency Medicine

## 2013-01-15 ENCOUNTER — Emergency Department (HOSPITAL_COMMUNITY)
Admission: EM | Admit: 2013-01-15 | Discharge: 2013-01-16 | Disposition: A | Discharge status: Home / Self Care | Payer: PRIVATE HEALTH INSURANCE | Attending: Emergency Medicine | Admitting: Emergency Medicine

## 2013-01-15 DIAGNOSIS — R21 Rash and other nonspecific skin eruption: Secondary | ICD-10-CM | POA: Insufficient documentation

## 2013-01-15 DIAGNOSIS — Z862 Personal history of diseases of the blood and blood-forming organs and certain disorders involving the immune mechanism: Secondary | ICD-10-CM | POA: Insufficient documentation

## 2013-01-15 DIAGNOSIS — Z91040 Latex allergy status: Secondary | ICD-10-CM | POA: Insufficient documentation

## 2013-01-15 DIAGNOSIS — Z79899 Other long term (current) drug therapy: Secondary | ICD-10-CM | POA: Insufficient documentation

## 2013-01-15 DIAGNOSIS — F319 Bipolar disorder, unspecified: Secondary | ICD-10-CM | POA: Insufficient documentation

## 2013-01-15 DIAGNOSIS — Z859 Personal history of malignant neoplasm, unspecified: Secondary | ICD-10-CM | POA: Insufficient documentation

## 2013-01-15 DIAGNOSIS — Z8619 Personal history of other infectious and parasitic diseases: Secondary | ICD-10-CM | POA: Insufficient documentation

## 2013-01-15 DIAGNOSIS — Y9389 Activity, other specified: Secondary | ICD-10-CM | POA: Insufficient documentation

## 2013-01-15 DIAGNOSIS — Y929 Unspecified place or not applicable: Secondary | ICD-10-CM | POA: Insufficient documentation

## 2013-01-15 DIAGNOSIS — Z8719 Personal history of other diseases of the digestive system: Secondary | ICD-10-CM | POA: Insufficient documentation

## 2013-01-15 DIAGNOSIS — Z8679 Personal history of other diseases of the circulatory system: Secondary | ICD-10-CM | POA: Insufficient documentation

## 2013-01-15 DIAGNOSIS — R51 Headache: Secondary | ICD-10-CM | POA: Insufficient documentation

## 2013-01-15 DIAGNOSIS — T7840XA Allergy, unspecified, initial encounter: Secondary | ICD-10-CM

## 2013-01-15 DIAGNOSIS — Z8639 Personal history of other endocrine, nutritional and metabolic disease: Secondary | ICD-10-CM | POA: Insufficient documentation

## 2013-01-15 DIAGNOSIS — R197 Diarrhea, unspecified: Secondary | ICD-10-CM | POA: Insufficient documentation

## 2013-01-15 DIAGNOSIS — F411 Generalized anxiety disorder: Secondary | ICD-10-CM | POA: Insufficient documentation

## 2013-01-15 DIAGNOSIS — Z9889 Other specified postprocedural states: Secondary | ICD-10-CM | POA: Insufficient documentation

## 2013-01-15 DIAGNOSIS — T61781A Other shellfish poisoning, accidental (unintentional), initial encounter: Secondary | ICD-10-CM | POA: Insufficient documentation

## 2013-01-15 DIAGNOSIS — R490 Dysphonia: Secondary | ICD-10-CM | POA: Insufficient documentation

## 2013-01-15 DIAGNOSIS — Z7982 Long term (current) use of aspirin: Secondary | ICD-10-CM | POA: Insufficient documentation

## 2013-01-15 DIAGNOSIS — F17200 Nicotine dependence, unspecified, uncomplicated: Secondary | ICD-10-CM | POA: Insufficient documentation

## 2013-01-15 MED ORDER — KETOROLAC TROMETHAMINE 30 MG/ML IJ SOLN
30.0000 mg | Freq: Once | INTRAMUSCULAR | Status: AC
  Administered 2013-01-15: 30 mg via INTRAVENOUS
  Filled 2013-01-15: qty 1

## 2013-01-15 MED ORDER — METHYLPREDNISOLONE SODIUM SUCC 125 MG IJ SOLR
125.0000 mg | Freq: Once | INTRAMUSCULAR | Status: AC
  Administered 2013-01-15: 125 mg via INTRAVENOUS
  Filled 2013-01-15: qty 2

## 2013-01-15 MED ORDER — FAMOTIDINE IN NACL 20-0.9 MG/50ML-% IV SOLN
20.0000 mg | Freq: Once | INTRAVENOUS | Status: AC
  Administered 2013-01-15: 20 mg via INTRAVENOUS
  Filled 2013-01-15: qty 50

## 2013-01-15 MED ORDER — DIPHENHYDRAMINE HCL 50 MG/ML IJ SOLN
25.0000 mg | Freq: Once | INTRAMUSCULAR | Status: AC
  Administered 2013-01-15: 25 mg via INTRAVENOUS
  Filled 2013-01-15: qty 1

## 2013-01-15 NOTE — ED Notes (Signed)
Patient reports is allergic to shrimp and accidentally consumed shrimp tonight. Complaining of hives, confusion, headache, abdominal pain, and slight throat tightness. Patient reports took two benadryl tablets at 2030. States throat felt as if it was swelling more prior to taking benadryl.

## 2013-01-15 NOTE — ED Notes (Signed)
Pt presents with allergic reaction to accidentally ingesting a shrimp egg while dining out. Pt reports taking 2 benadryl tablets (50 mg)after realizing she had ingested said shellfish. Pt denies SOB at this time. Hives and/or blotchy areas noted on abdomin and left forearm. PIV started and pt placed on cardiac monitor.

## 2013-01-15 NOTE — ED Provider Notes (Signed)
CSN: [id number]     Arrival date & time 01/15/13  2157 History   This chart was scribed for Martha Booze, MD by Martha Espinoza, ED Scribe. This patient was seen in room APA11/APA11 and the patient's care was started at 11:19 PM.    Chief Complaint  Patient presents with  . Allergic Reaction    The history is provided by the patient. No language interpreter was used.    HPI Comments: Martha Espinoza is a 47 y.o. female with a h/o allergies to shrimp who presents to the Emergency Department complaining of a rapid onset, gradually worsening, constant allergic reaction to shrimp that started around 8 PM tonight. She states that she took 5 bites of a shrimp egg roll not knowing that shrimp was present. She reports that when she realized what was in the egg roll, she attempted to spit out as much as she could but had already swallowed two bites. She states that she began experiencing throat swelling, diffuse HA, 2 episodes of diarrhea and hoarse voice shortly after ingesting the shrimp. She states that the HA is the worst of the symptoms. She reports that she took two 50 mg benadryl shortly after the onset and rested with no improvement. She states that she has a chronic rash attributed to her "nerves" which was worsened tonight after ingesting the shrimp. She denies any trouble swallowing, SOB and facial swelling as associated symptoms.   No current PCP  Past Medical History  Diagnosis Date  . Polycystic ovarian syndrome   . Colitis due to Clostridium difficile 2001  . Hepatic hemangioma   . Anxiety   . IBS (irritable bowel syndrome)   . Salmonella   . GERD (gastroesophageal reflux disease)   . CIS (carcinoma in situ)     rectum  . Migraine   . Bipolar 1 disorder   . Migraine headache   . Cancer    Past Surgical History  Procedure Laterality Date  . Cholecystectomy    . Colonoscopy  05/05/2007    adenoma  . Situ removed  age 38    High-grade rectal adenoma removed from rectum   .  Dilation and curettage of uterus      for vaginal bleeding  . Tubal ligation     Family History  Problem Relation Age of Onset  . Colon cancer Father 106  . Diverticulitis Mother    History  Substance Use Topics  . Smoking status: Current Every Day Smoker -- 0.50 packs/day for 18 years    Types: Cigarettes  . Smokeless tobacco: Not on file  . Alcohol Use: No   No OB history provided.  Review of Systems  HENT: Positive for voice change. Negative for facial swelling and trouble swallowing.   Respiratory: Negative for shortness of breath and wheezing.   Gastrointestinal: Positive for diarrhea. Negative for nausea.  Skin: Positive for rash (chronic, worse after shrimp ingestion).  Neurological: Positive for headaches.  All other systems reviewed and are negative.    Allergies  Shellfish allergy; Codeine; and Latex  Home Medications   Current Outpatient Rx  Name  Route  Sig  Dispense  Refill  . aspirin 81 MG chewable tablet   Oral   Chew 1 tablet (81 mg total) by mouth daily.   14 tablet   0   . busPIRone (BUSPAR) 7.5 MG tablet   Oral   Take 7.5 mg by mouth 3 (three) times daily.         Marland Kitchen  diphenhydrAMINE (BENADRYL) 25 MG tablet   Oral   Take 50 mg by mouth once.         Marland Kitchen FLUoxetine (PROZAC) 40 MG capsule   Oral   Take 40 mg by mouth at bedtime.         . traZODone (DESYREL) 100 MG tablet   Oral   Take 100 mg by mouth at bedtime as needed for sleep.          Triage Vitals: BP 116/67  Pulse 91  Temp(Src) 98.1 F (36.7 C) (Oral)  Resp 22  Ht 5\' 1"  (1.549 m)  Wt 187 lb (84.823 kg)  BMI 35.35 kg/m2  SpO2 100%  LMP 01/02/2013  Physical Exam  Nursing note and vitals reviewed. Constitutional: She is oriented to person, place, and time. She appears well-developed and well-nourished. No distress.  HENT:  Head: Normocephalic and atraumatic.  Very mild edema of the uvula, handling secretions well  Eyes: EOM are normal.  Neck: Neck supple. No  tracheal deviation present.  Cardiovascular: Normal rate and regular rhythm.   Pulmonary/Chest: Effort normal and breath sounds normal. No respiratory distress. She has no wheezes.  Musculoskeletal: Normal range of motion.  Neurological: She is alert and oriented to person, place, and time.  Skin: Skin is warm and dry. Rash noted.  Raised erythematous rash on the proximal arms and legs, non-specific in appearance   Psychiatric: She has a normal mood and affect. Her behavior is normal.    ED Course  Procedures (including critical care time)  Medications  diphenhydrAMINE (BENADRYL) injection 25 mg (not administered)  famotidine (PEPCID) IVPB 20 mg (not administered)  methylPREDNISolone sodium succinate (SOLU-MEDROL) 125 mg/2 mL injection 125 mg (not administered)    DIAGNOSTIC STUDIES: Oxygen Saturation is 100% on room air, normal by my interpretation.    COORDINATION OF CARE: 11:22 PM-Advised pt that her symptoms are stable. Discussed treatment plan which includes observation with pt at bedside and pt agreed to plan. Will discharge pt with EPI pen for future contact with allergens.   MDM   1. Allergic reaction, initial encounter    An allergic reaction to shellfish. She'll be given IV famotidine and IV diphenhydramine as well as methylprednisolone.  Following above noted treatment, she feels much better. She is advised to continue taking over-the-counter Benadryl as needed and as a vice take either famotidine or ranitidine for the next several days. She's given a dose of dexamethasone prior to discharge.   I personally performed the services described in this documentation, which was scribed in my presence. The recorded information has been reviewed and is accurate.     Martha Booze, MD 01/16/13 [phone]

## 2013-01-16 MED ORDER — DEXAMETHASONE SODIUM PHOSPHATE 10 MG/ML IJ SOLN
10.0000 mg | Freq: Once | INTRAMUSCULAR | Status: AC
  Administered 2013-01-16: 10 mg via INTRAVENOUS
  Filled 2013-01-16: qty 1

## 2013-01-16 NOTE — Discharge Instructions (Signed)
Take Benadryl every 4 hours as needed. Take famotidine (Pepcid) or ranitidine (Zantac) twice a day for the next 5 days.   Allergies, Generic Allergies may happen from anything your body is sensitive to. This may be food, medicines, pollens, chemicals, and nearly anything around you in everyday life that produces allergens. An allergen is anything that causes an allergy producing substance. Heredity is often a factor in causing these problems. This means you may have some of the same allergies as your parents. Food allergies happen in all age groups. Food allergies are some of the most severe and life threatening. Some common food allergies are cow's milk, seafood, eggs, nuts, wheat, and soybeans. SYMPTOMS   Swelling around the mouth.  An itchy red rash or hives.  Vomiting or diarrhea.  Difficulty breathing. SEVERE ALLERGIC REACTIONS ARE LIFE-THREATENING. This reaction is called anaphylaxis. It can cause the mouth and throat to swell and cause difficulty with breathing and swallowing. In severe reactions only a trace amount of food (for example, peanut oil in a salad) may cause death within seconds. Seasonal allergies occur in all age groups. These are seasonal because they usually occur during the same season every year. They may be a reaction to molds, grass pollens, or tree pollens. Other causes of problems are house dust mite allergens, pet dander, and mold spores. The symptoms often consist of nasal congestion, a runny itchy nose associated with sneezing, and tearing itchy eyes. There is often an associated itching of the mouth and ears. The problems happen when you come in contact with pollens and other allergens. Allergens are the particles in the air that the body reacts to with an allergic reaction. This causes you to release allergic antibodies. Through a chain of events, these eventually cause you to release histamine into the blood stream. Although it is meant to be protective to the  body, it is this release that causes your discomfort. This is why you were given anti-histamines to feel better. If you are unable to pinpoint the offending allergen, it may be determined by skin or blood testing. Allergies cannot be cured but can be controlled with medicine. Hay fever is a collection of all or some of the seasonal allergy problems. It may often be treated with simple over-the-counter medicine such as diphenhydramine. Take medicine as directed. Do not drink alcohol or drive while taking this medicine. Check with your caregiver or package insert for child dosages. If these medicines are not effective, there are many new medicines your caregiver can prescribe. Stronger medicine such as nasal spray, eye drops, and corticosteroids may be used if the first things you try do not work well. Other treatments such as immunotherapy or desensitizing injections can be used if all else fails. Follow up with your caregiver if problems continue. These seasonal allergies are usually not life threatening. They are generally more of a nuisance that can often be handled using medicine. HOME CARE INSTRUCTIONS   If unsure what causes a reaction, keep a diary of foods eaten and symptoms that follow. Avoid foods that cause reactions.  If hives or rash are present:  Take medicine as directed.  You may use an over-the-counter antihistamine (diphenhydramine) for hives and itching as needed.  Apply cold compresses (cloths) to the skin or take baths in cool water. Avoid hot baths or showers. Heat will make a rash and itching worse.  If you are severely allergic:  Following a treatment for a severe reaction, hospitalization is often required for closer  follow-up.  Wear a medic-alert bracelet or necklace stating the allergy.  You and your family must learn how to give adrenaline or use an anaphylaxis kit.  If you have had a severe reaction, always carry your anaphylaxis kit or EpiPen with you. Use this  medicine as directed by your caregiver if a severe reaction is occurring. Failure to do so could have a fatal outcome. SEEK MEDICAL CARE IF:  You suspect a food allergy. Symptoms generally happen within 30 minutes of eating a food.  Your symptoms have not gone away within 2 days or are getting worse.  You develop new symptoms.  You want to retest yourself or your child with a food or drink you think causes an allergic reaction. Never do this if an anaphylactic reaction to that food or drink has happened before. Only do this under the care of a caregiver. SEEK IMMEDIATE MEDICAL CARE IF:   You have difficulty breathing, are wheezing, or have a tight feeling in your chest or throat.  You have a swollen mouth, or you have hives, swelling, or itching all over your body.  You have had a severe reaction that has responded to your anaphylaxis kit or an EpiPen. These reactions may return when the medicine has worn off. These reactions should be considered life threatening. MAKE SURE YOU:   Understand these instructions.  Will watch your condition.  Will get help right away if you are not doing well or get worse. Document Released: 06/05/2002 Document Revised: 06/04/2011 Document Reviewed: 11/10/2007 [facility] Patient Information 2014 [place], [place].

## 2013-02-07 ENCOUNTER — Emergency Department (HOSPITAL_COMMUNITY)
Admission: EM | Admit: 2013-02-07 | Discharge: 2013-02-07 | Disposition: A | Discharge status: Home / Self Care | Payer: PRIVATE HEALTH INSURANCE | Attending: Emergency Medicine | Admitting: Emergency Medicine

## 2013-02-07 ENCOUNTER — Emergency Department (HOSPITAL_COMMUNITY): Payer: PRIVATE HEALTH INSURANCE

## 2013-02-07 ENCOUNTER — Encounter (HOSPITAL_COMMUNITY): Payer: Self-pay | Admitting: Emergency Medicine

## 2013-02-07 DIAGNOSIS — Z7982 Long term (current) use of aspirin: Secondary | ICD-10-CM | POA: Insufficient documentation

## 2013-02-07 DIAGNOSIS — F17200 Nicotine dependence, unspecified, uncomplicated: Secondary | ICD-10-CM | POA: Insufficient documentation

## 2013-02-07 DIAGNOSIS — Z79899 Other long term (current) drug therapy: Secondary | ICD-10-CM | POA: Insufficient documentation

## 2013-02-07 DIAGNOSIS — R220 Localized swelling, mass and lump, head: Secondary | ICD-10-CM

## 2013-02-07 DIAGNOSIS — F319 Bipolar disorder, unspecified: Secondary | ICD-10-CM | POA: Insufficient documentation

## 2013-02-07 DIAGNOSIS — Z8619 Personal history of other infectious and parasitic diseases: Secondary | ICD-10-CM | POA: Insufficient documentation

## 2013-02-07 DIAGNOSIS — Z91040 Latex allergy status: Secondary | ICD-10-CM | POA: Insufficient documentation

## 2013-02-07 DIAGNOSIS — K0889 Other specified disorders of teeth and supporting structures: Secondary | ICD-10-CM

## 2013-02-07 DIAGNOSIS — F411 Generalized anxiety disorder: Secondary | ICD-10-CM | POA: Insufficient documentation

## 2013-02-07 DIAGNOSIS — Z8639 Personal history of other endocrine, nutritional and metabolic disease: Secondary | ICD-10-CM | POA: Insufficient documentation

## 2013-02-07 DIAGNOSIS — Z8679 Personal history of other diseases of the circulatory system: Secondary | ICD-10-CM | POA: Insufficient documentation

## 2013-02-07 DIAGNOSIS — K089 Disorder of teeth and supporting structures, unspecified: Secondary | ICD-10-CM | POA: Insufficient documentation

## 2013-02-07 DIAGNOSIS — Z862 Personal history of diseases of the blood and blood-forming organs and certain disorders involving the immune mechanism: Secondary | ICD-10-CM | POA: Insufficient documentation

## 2013-02-07 DIAGNOSIS — Z8719 Personal history of other diseases of the digestive system: Secondary | ICD-10-CM | POA: Insufficient documentation

## 2013-02-07 DIAGNOSIS — J3489 Other specified disorders of nose and nasal sinuses: Secondary | ICD-10-CM | POA: Insufficient documentation

## 2013-02-07 DIAGNOSIS — K0510 Chronic gingivitis, plaque induced: Secondary | ICD-10-CM | POA: Insufficient documentation

## 2013-02-07 DIAGNOSIS — R21 Rash and other nonspecific skin eruption: Secondary | ICD-10-CM | POA: Insufficient documentation

## 2013-02-07 LAB — COMPREHENSIVE METABOLIC PANEL
ALT: 9 U/L (ref 0–35)
AST: 8 U/L (ref 0–37)
Albumin: 3.3 g/dL — ABNORMAL LOW (ref 3.5–5.2)
Alkaline Phosphatase: 59 U/L (ref 39–117)
BUN: 11 mg/dL (ref 6–23)
CO2: 24 mEq/L (ref 19–32)
Calcium: 8.9 mg/dL (ref 8.4–10.5)
Chloride: 104 mEq/L (ref 96–112)
Creatinine, Ser: 0.77 mg/dL (ref 0.50–1.10)
GFR calc Af Amer: >90 mL/min (ref >90)
GFR calc non Af Amer: >90 mL/min (ref >90)
Glucose, Bld: 57 mg/dL — ABNORMAL LOW (ref 70–99)
Potassium: 3.4 mEq/L — ABNORMAL LOW (ref 3.5–5.1)
Sodium: 138 mEq/L (ref 135–145)
Total Bilirubin: 0.3 mg/dL (ref 0.3–1.2)
Total Protein: 6.5 g/dL (ref 6.0–8.3)

## 2013-02-07 LAB — CBC WITH DIFFERENTIAL/PLATELET
Basophils Absolute: 0 10*3/uL (ref 0.0–0.1)
Basophils Relative: 0 % (ref 0–1)
Eosinophils Absolute: 0.1 10*3/uL (ref 0.0–0.7)
Eosinophils Relative: 1 % (ref 0–5)
HCT: 39.1 % (ref 36.0–46.0)
Hemoglobin: 13.1 g/dL (ref 12.0–15.0)
Lymphocytes Relative: 32 % (ref 12–46)
Lymphs Abs: 2.2 10*3/uL (ref 0.7–4.0)
MCH: 29.8 pg (ref 26.0–34.0)
MCHC: 33.5 g/dL (ref 30.0–36.0)
MCV: 89.1 fL (ref 78.0–100.0)
Monocytes Absolute: 0.6 10*3/uL (ref 0.1–1.0)
Monocytes Relative: 8 % (ref 3–12)
Neutro Abs: 4 10*3/uL (ref 1.7–7.7)
Neutrophils Relative %: 59 % (ref 43–77)
Platelets: 210 10*3/uL (ref 150–400)
RBC: 4.39 MIL/uL (ref 3.87–5.11)
RDW: 14.5 % (ref 11.5–15.5)
WBC: 6.8 10*3/uL (ref 4.0–10.5)

## 2013-02-07 MED ORDER — FAMOTIDINE IN NACL 20-0.9 MG/50ML-% IV SOLN
20.0000 mg | Freq: Once | INTRAVENOUS | Status: AC
  Administered 2013-02-07: 20 mg via INTRAVENOUS
  Filled 2013-02-07: qty 50

## 2013-02-07 MED ORDER — PENICILLIN V POTASSIUM 500 MG PO TABS: 500.0000 mg | ORAL_TABLET | Freq: Four times a day (QID) | ORAL | Status: AC

## 2013-02-07 MED ORDER — HYDROCODONE-ACETAMINOPHEN 5-325 MG PO TABS: 2.0000 | ORAL_TABLET | ORAL | Status: DC | PRN

## 2013-02-07 MED ORDER — HYDROCODONE-ACETAMINOPHEN 5-325 MG PO TABS
2.0000 | ORAL_TABLET | Freq: Once | ORAL | Status: AC
  Administered 2013-02-07: 2 via ORAL
  Filled 2013-02-07: qty 2

## 2013-02-07 MED ORDER — DIPHENHYDRAMINE HCL 50 MG/ML IJ SOLN
25.0000 mg | Freq: Once | INTRAMUSCULAR | Status: AC
  Administered 2013-02-07: 25 mg via INTRAVENOUS
  Filled 2013-02-07: qty 1

## 2013-02-07 MED ORDER — SODIUM CHLORIDE 0.9 % IV BOLUS (SEPSIS)
1000.0000 mL | Freq: Once | INTRAVENOUS | Status: AC
  Administered 2013-02-07: 1000 mL via INTRAVENOUS

## 2013-02-07 MED ORDER — METHYLPREDNISOLONE SODIUM SUCC 125 MG IJ SOLR
125.0000 mg | Freq: Once | INTRAMUSCULAR | Status: AC
  Administered 2013-02-07: 125 mg via INTRAVENOUS
  Filled 2013-02-07: qty 2

## 2013-02-07 NOTE — ED Notes (Signed)
After phlebotomy draw, patient hyperventilating and crying. When prompted by RN what was wrong, patient answered "I don't know". Patient informed she would pass out if she didn't slow her breathing down. Family at bedside. After a few minutes, patient regulated her breathing and calmed down.

## 2013-02-07 NOTE — ED Notes (Signed)
States she feels shaky and feels like her mouth is swelling, feels like her teeth are pulling away from her gums

## 2013-02-07 NOTE — ED Provider Notes (Signed)
CSN: [id number]     Arrival date & time 02/07/13  1337 History  This chart was scribed for Glynn Octave, MD by Ardelia Mems, ED Scribe. This patient was seen in room APA06/[facility]06 and the patient's care was started at 1:52 PM.   Chief Complaint  Patient presents with  . Allergic Reaction    The history is provided by the patient. No language interpreter was used.    HPI Comments: KRISTIANE MORSCH is a 47 y.o. female who presents to the Emergency Department complaining of gradually worsening oral and facial swelling onset this morning. She also states that she noticed a pruritic rash on left wrist today. She states that she had some sinus pressure and mild facial pain preceding this yesterday. She also states that her lower teeth are sore today. She also states that she has had rhinorrhea and congestion recently. She states that she was seen in the ED 3 weeks ago for an allergic reaction to shellfish. She states that her symptoms are similar today. She states that she has not been exposed to shellfish since her last visit to the ED. She states that she has not taken any new medications. She states that she has not used any new detergents, lotions or soaps. She states that she took Benadryl with mild relief today. She denies having a history of DM. She denies fever, SOB, chest pain, abdominal pain, difficulty breathing or swallowing or any other symptoms.  PCP -Dr. Vivia Ewing   Past Medical History  Diagnosis Date  . Polycystic ovarian syndrome   . Colitis due to Clostridium difficile 2001  . Hepatic hemangioma   . Anxiety   . IBS (irritable bowel syndrome)   . Salmonella   . GERD (gastroesophageal reflux disease)   . CIS (carcinoma in situ)     rectum  . Migraine   . Bipolar 1 disorder   . Migraine headache   . Cancer    Past Surgical History  Procedure Laterality Date  . Cholecystectomy    . Colonoscopy  05/05/2007    adenoma  . Situ removed  age 42    High-grade rectal adenoma  removed from rectum   . Dilation and curettage of uterus      for vaginal bleeding  . Tubal ligation     Family History  Problem Relation Age of Onset  . Colon cancer Father 50  . Diverticulitis Mother    History  Substance Use Topics  . Smoking status: Current Every Day Smoker -- 0.50 packs/day for 18 years    Types: Cigarettes  . Smokeless tobacco: Not on file  . Alcohol Use: No   OB History   Grav Para Term Preterm Abortions TAB SAB Ect Mult Living                 Review of Systems A complete 10 system review of systems was obtained and all systems are negative except as noted in the HPI and PMH.    Allergies  Shellfish allergy; Codeine; and Latex  Home Medications   Current Outpatient Rx  Name  Route  Sig  Dispense  Refill  . aspirin 81 MG chewable tablet   Oral   Chew 1 tablet (81 mg total) by mouth daily.   14 tablet   0   . busPIRone (BUSPAR) 7.5 MG tablet   Oral   Take 7.5 mg by mouth 3 (three) times daily.         . diphenhydrAMINE (BENADRYL)  25 MG tablet   Oral   Take 50 mg by mouth once.         Marland Kitchen FLUoxetine (PROZAC) 40 MG capsule   Oral   Take 40 mg by mouth at bedtime.         Marland Kitchen ibuprofen (ADVIL,MOTRIN) 200 MG tablet   Oral   Take 800 mg by mouth every 6 (six) hours as needed for moderate pain.         . traZODone (DESYREL) 100 MG tablet   Oral   Take 100 mg by mouth at bedtime as needed for sleep.         Marland Kitchen HYDROcodone-acetaminophen (NORCO/VICODIN) 5-325 MG per tablet   Oral   Take 2 tablets by mouth every 4 (four) hours as needed.   10 tablet   0   . penicillin v potassium (VEETID) 500 MG tablet   Oral   Take 1 tablet (500 mg total) by mouth 4 (four) times daily.   40 tablet   0    Triage Vitals: BP 129/74  Pulse 89  Temp(Src) 97.7 F (36.5 C) (Oral)  Resp 20  Ht 5' (1.524 m)  Wt 190 lb (86.183 kg)  BMI 37.11 kg/m2  SpO2 98%  LMP 01/02/2013  Physical Exam  Nursing note and vitals reviewed. Constitutional: She  is oriented to person, place, and time. She appears well-developed and well-nourished. No distress.  HENT:  Head: Normocephalic and atraumatic.  Mouth/Throat: No oropharyngeal exudate.  Poor dentition throughout. Diffuse swelling of lower gingiva. Handling secretions well. Uvula midline. Floor of mouth soft. Nasal congestion. Maxillary sinus tenderness.  Eyes: Conjunctivae and EOM are normal. Pupils are equal, round, and reactive to light.  Neck: Normal range of motion. Neck supple. No tracheal deviation present.  Cardiovascular: Normal rate.   No murmur heard. Pulmonary/Chest: Effort normal and breath sounds normal. No respiratory distress. She has no wheezes. She has no rales.  Abdominal: Soft. There is no tenderness. There is no rebound and no guarding.  Musculoskeletal: Normal range of motion. She exhibits no edema and no tenderness.  Neurological: She is alert and oriented to person, place, and time. No cranial nerve deficit. She exhibits normal muscle tone. Coordination normal.  Skin: Skin is warm and dry.  Psychiatric: She has a normal mood and affect. Her behavior is normal.    ED Course  Procedures (including critical care time)  DIAGNOSTIC STUDIES: Oxygen Saturation is 98% on RA, normal by my interpretation.    COORDINATION OF CARE: 1:59 PM- Discussed plan for pt to receive medications in the ED. Will also obtian a CBC and CMP. Pt advised of plan for treatment and pt agrees.  Medications  HYDROcodone-acetaminophen (NORCO/VICODIN) 5-325 MG per tablet 2 tablet (not administered)  diphenhydrAMINE (BENADRYL) injection 25 mg (25 mg Intravenous Given 02/07/13 1411)  famotidine (PEPCID) IVPB 20 mg (20 mg Intravenous New Bag/Given 02/07/13 1412)  methylPREDNISolone sodium succinate (SOLU-MEDROL) 125 mg/2 mL injection 125 mg (125 mg Intravenous Given 02/07/13 1412)  sodium chloride 0.9 % bolus 1,000 mL (1,000 mLs Intravenous New Bag/Given 02/07/13 1411)   Labs Review Labs Reviewed   COMPREHENSIVE METABOLIC PANEL - Abnormal; Notable for the following:    Potassium 3.4 (*)    Glucose, Bld 57 (*)    Albumin 3.3 (*)    All other components within normal limits  CBC WITH DIFFERENTIAL   Imaging Review Ct Soft Tissue Neck Wo Contrast  02/07/2013   CLINICAL DATA:  Possible allergic reaction to shrimp.  Swelling in throat with difficulty swallowing.  EXAM: CT NECK WITHOUT CONTRAST  TECHNIQUE: Multidetector CT imaging of the neck was performed following the standard protocol without intravenous contrast.  COMPARISON:  Cervical spine CT of 04/24/2011  FINDINGS: Limited intracranial imaging is within normal limits.  Normal appearance of the imaged orbits and globes.  Normal nasopharynx, oral pharynx, hypopharynx, and larynx. Airway widely patent.  Hypoventilation within the imaged lung apices, likely due to poor inspiratory effort.  Normal thyroid gland, submandibular glands, and left parotid gland. Small nodes identified within the right parotid gland.  Increased number of nodes identified within the jugular chains and submandibular spaces bilaterally. These maintain their fatty hila and are favored to be reactive. An index left-sided jugulodigastric node measures 1.6 cm on image 59/ series 2, similar on 04/24/2011 cervical spine study.  No subcutaneous edema identified.  Bone windows demonstrate hyperostosis frontalis interna.  IMPRESSION: 1. No evidence of airway narrowing or explanation for patient's symptoms. 2. Increased number of cervical nodes, favored to be reactive. Similar to 04/24/2011 cervical spine CT.   Electronically Signed   By: Jeronimo Greaves M.D.   On: 02/07/2013 15:47    EKG Interpretation   None       MDM   1. Pain, dental   2. Facial swelling    Facial swelling since this morning patient concern for allergic reaction. Denies any new exposures. No difficulty breathing or swallowing. Lungs clear no wheezing  No evidence of dental abscess or Ludwig  angina. Patient's pain is likely from her poor dentition and gingivitis. Floor of mouth is soft. Uvula is midline. Tolerating secretions and speaking in full sentences.  Patient given steroids and antihistamines for possible allergic reaction. Patient will be treated with penicillin for her gingivitis and dental disease. This will also cover her sinuses. Followup with her dentist this week. Return precautions discussed including difficulty breathing, difficulty swallowing, chest pain, shortness of breath or any other concerns/  I personally performed the services described in this documentation, which was scribed in my presence. The recorded information has been reviewed and is accurate.   Glynn Octave, MD 02/07/13 [phone]

## 2013-02-21 ENCOUNTER — Emergency Department (HOSPITAL_COMMUNITY)
Admission: EM | Admit: 2013-02-21 | Discharge: 2013-02-21 | Disposition: A | Discharge status: Home / Self Care | Payer: PRIVATE HEALTH INSURANCE | Attending: Emergency Medicine | Admitting: Emergency Medicine

## 2013-02-21 ENCOUNTER — Encounter (HOSPITAL_COMMUNITY): Payer: Self-pay | Admitting: Emergency Medicine

## 2013-02-21 DIAGNOSIS — F17200 Nicotine dependence, unspecified, uncomplicated: Secondary | ICD-10-CM | POA: Insufficient documentation

## 2013-02-21 DIAGNOSIS — R51 Headache: Secondary | ICD-10-CM | POA: Insufficient documentation

## 2013-02-21 DIAGNOSIS — Z8619 Personal history of other infectious and parasitic diseases: Secondary | ICD-10-CM | POA: Insufficient documentation

## 2013-02-21 DIAGNOSIS — Z8719 Personal history of other diseases of the digestive system: Secondary | ICD-10-CM | POA: Insufficient documentation

## 2013-02-21 DIAGNOSIS — F319 Bipolar disorder, unspecified: Secondary | ICD-10-CM | POA: Insufficient documentation

## 2013-02-21 DIAGNOSIS — IMO0002 Reserved for concepts with insufficient information to code with codable children: Secondary | ICD-10-CM | POA: Insufficient documentation

## 2013-02-21 DIAGNOSIS — Z7982 Long term (current) use of aspirin: Secondary | ICD-10-CM | POA: Insufficient documentation

## 2013-02-21 DIAGNOSIS — D1809 Hemangioma of other sites: Secondary | ICD-10-CM | POA: Insufficient documentation

## 2013-02-21 DIAGNOSIS — Z85048 Personal history of other malignant neoplasm of rectum, rectosigmoid junction, and anus: Secondary | ICD-10-CM | POA: Insufficient documentation

## 2013-02-21 DIAGNOSIS — Z8639 Personal history of other endocrine, nutritional and metabolic disease: Secondary | ICD-10-CM | POA: Insufficient documentation

## 2013-02-21 DIAGNOSIS — H6500 Acute serous otitis media, unspecified ear: Secondary | ICD-10-CM | POA: Insufficient documentation

## 2013-02-21 DIAGNOSIS — R42 Dizziness and giddiness: Secondary | ICD-10-CM | POA: Insufficient documentation

## 2013-02-21 DIAGNOSIS — Z8679 Personal history of other diseases of the circulatory system: Secondary | ICD-10-CM | POA: Insufficient documentation

## 2013-02-21 DIAGNOSIS — Z862 Personal history of diseases of the blood and blood-forming organs and certain disorders involving the immune mechanism: Secondary | ICD-10-CM | POA: Insufficient documentation

## 2013-02-21 DIAGNOSIS — F411 Generalized anxiety disorder: Secondary | ICD-10-CM | POA: Insufficient documentation

## 2013-02-21 DIAGNOSIS — Z79899 Other long term (current) drug therapy: Secondary | ICD-10-CM | POA: Insufficient documentation

## 2013-02-21 DIAGNOSIS — Z91040 Latex allergy status: Secondary | ICD-10-CM | POA: Insufficient documentation

## 2013-02-21 MED ORDER — PREDNISONE 50 MG PO TABS
60.0000 mg | ORAL_TABLET | Freq: Once | ORAL | Status: AC
  Administered 2013-02-21: 60 mg via ORAL
  Filled 2013-02-21 (×2): qty 1

## 2013-02-21 MED ORDER — ANTIPYRINE-BENZOCAINE 5.4-1.4 % OT SOLN
3.0000 [drp] | Freq: Once | OTIC | Status: AC
  Administered 2013-02-21: 4 [drp] via OTIC
  Filled 2013-02-21: qty 10

## 2013-02-21 MED ORDER — PREDNISONE 20 MG PO TABS: ORAL_TABLET | ORAL | Status: DC

## 2013-02-21 NOTE — ED Notes (Addendum)
Pt c/o headache for 3 days which is a chronic issue with her and is not having a headache at this time. Pt does however c/o a "horrible pain" in her right ear and dizziness.

## 2013-02-21 NOTE — ED Notes (Signed)
Pt states right ear pain for the past 2 days. Pt states has a history of issues w/ headache. Pt states some nausea w/ headache also. No drainage from ear noted.

## 2013-02-21 NOTE — ED Notes (Signed)
Pt alert & oriented x4, stable gait. Patient given discharge instructions, paperwork & prescription(s). Patient  instructed to stop at the registration desk to finish any additional paperwork. Patient verbalized understanding. Pt left department w/ no further questions. 

## 2013-02-23 NOTE — ED Provider Notes (Signed)
CSN: [id number]     Arrival date & time 02/21/13  2121 History   First MD Initiated Contact with Patient 02/21/13 2200     Chief Complaint  Patient presents with  . Headache  . Otalgia   (Consider location/radiation/quality/duration/timing/severity/associated sxs/prior Treatment) Patient is a 47 y.o. female presenting with ear pain. The history is provided by the patient.  Otalgia Location:  Right Behind ear:  No abnormality Quality:  Throbbing Severity:  Moderate Onset quality:  Gradual Duration:  2 days Timing:  Constant Progression:  Unchanged Chronicity:  New Context: not direct blow and not foreign body in ear   Relieved by:  Nothing Worsened by:  Coughing Ineffective treatments:  None tried Associated symptoms: headaches   Associated symptoms: no abdominal pain, no congestion, no ear discharge, no fever, no neck pain, no rash, no rhinorrhea, no sore throat and no vomiting   Risk factors: no recent travel and no prior ear surgery     Past Medical History  Diagnosis Date  . Polycystic ovarian syndrome   . Colitis due to Clostridium difficile 2001  . Hepatic hemangioma   . Anxiety   . IBS (irritable bowel syndrome)   . Salmonella   . GERD (gastroesophageal reflux disease)   . CIS (carcinoma in situ)     rectum  . Bipolar 1 disorder   . Migraine headache    Past Surgical History  Procedure Laterality Date  . Cholecystectomy    . Colonoscopy  05/05/2007    adenoma  . Situ removed  age 40    High-grade rectal adenoma removed from rectum   . Dilation and curettage of uterus      for vaginal bleeding  . Tubal ligation     Family History  Problem Relation Age of Onset  . Colon cancer Father 41  . Diverticulitis Mother    History  Substance Use Topics  . Smoking status: Current Every Day Smoker -- 0.50 packs/day for 18 years    Types: Cigarettes  . Smokeless tobacco: Not on file  . Alcohol Use: No   OB History   Grav Para Term Preterm Abortions TAB SAB  Ect Mult Living                 Review of Systems  Constitutional: Negative for fever, activity change and appetite change.  HENT: Positive for ear pain. Negative for congestion, ear discharge, facial swelling, rhinorrhea, sore throat and trouble swallowing.   Eyes: Negative for photophobia, pain and visual disturbance.  Respiratory: Negative for shortness of breath.   Cardiovascular: Negative for chest pain.  Gastrointestinal: Negative for nausea, vomiting and abdominal pain.  Musculoskeletal: Negative for neck pain and neck stiffness.  Skin: Negative for rash and wound.  Neurological: Positive for dizziness and headaches. Negative for tremors, syncope, facial asymmetry, speech difficulty, weakness and numbness.  Psychiatric/Behavioral: Negative for confusion and decreased concentration.  All other systems reviewed and are negative.    Allergies  Shellfish allergy; Codeine; and Latex  Home Medications   Current Outpatient Rx  Name  Route  Sig  Dispense  Refill  . albuterol (VENTOLIN HFA) 108 (90 BASE) MCG/ACT inhaler   Inhalation   Inhale 2 puffs into the lungs every 6 (six) hours as needed for wheezing or shortness of breath.         Marland Kitchen aspirin 81 MG chewable tablet   Oral   Chew 1 tablet (81 mg total) by mouth daily.   14 tablet   0   .  busPIRone (BUSPAR) 7.5 MG tablet   Oral   Take 7.5 mg by mouth 3 (three) times daily.         . diphenhydrAMINE (BENADRYL) 25 MG tablet   Oral   Take 25 mg by mouth daily as needed for itching or allergies.         Marland Kitchen FLUoxetine (PROZAC) 40 MG capsule   Oral   Take 40 mg by mouth at bedtime.         Marland Kitchen ibuprofen (ADVIL,MOTRIN) 200 MG tablet   Oral   Take 800 mg by mouth every 6 (six) hours as needed for moderate pain.         . traZODone (DESYREL) 100 MG tablet   Oral   Take 100 mg by mouth at bedtime as needed for sleep.         . predniSONE (DELTASONE) 20 MG tablet      Take 3 tablets po qd x 2 days, then 2  tablets po qd x 2 days, then 1 tablet po qd x 2 days   12 tablet   0    BP 127/62  Pulse 90  Temp(Src) 98.2 F (36.8 C) (Oral)  Resp 18  Ht 5' (1.524 m)  Wt 187 lb (84.823 kg)  BMI 36.52 kg/m2  SpO2 97%  LMP 01/26/2013 Physical Exam  Nursing note and vitals reviewed. Constitutional: She is oriented to person, place, and time. She appears well-developed and well-nourished. No distress.  HENT:  Head: Normocephalic and atraumatic.  Mouth/Throat: Uvula is midline, oropharynx is clear and moist and mucous membranes are normal. No uvula swelling. No oropharyngeal exudate.  Mild erythema and air fluid levels behind right TM.  No drainage or edema of the ear canal, TM appears intact.    Eyes: Conjunctivae and EOM are normal. Pupils are equal, round, and reactive to light.  Neck: Normal range of motion, full passive range of motion without pain and phonation normal. Neck supple. No Brudzinski's sign and no Kernig's sign noted.  Cardiovascular: Normal rate, regular rhythm, normal heart sounds and intact distal pulses.   No murmur heard. Pulmonary/Chest: Effort normal and breath sounds normal. No stridor. No respiratory distress.  Musculoskeletal: Normal range of motion.  Lymphadenopathy:    She has no cervical adenopathy.  Neurological: She is alert and oriented to person, place, and time. Coordination normal.  Skin: Skin is warm and dry. No rash noted.    ED Course  Procedures (including critical care time) Labs Review Labs Reviewed - No data to display Imaging Review No results found.  EKG Interpretation   None       MDM   1. Serous otitis media, right    Vitals stable,  Pt is non-toxic appearing.  No focal neuro deficits, no meningeal signs.  Ambulates with a steady gait.  Headache of gradual onset 3 days ago that has since resolved.  Serous OM present.      Patient agrees to close f/u with PMD or to return here if the symptoms worsen. VSS.  Patient appears stable for  d/c    Brenlyn Beshara L. Trisha Mangle, PA-C 02/23/13 1642

## 2013-02-25 NOTE — ED Provider Notes (Signed)
Medical screening examination/treatment/procedure(s) were performed by non-physician practitioner and as supervising physician I was immediately available for consultation/collaboration.  EKG Interpretation   None        Aliyanna Wassmer R. Haskel Dewalt, MD 02/25/13 1521 

## 2013-03-21 ENCOUNTER — Emergency Department (HOSPITAL_COMMUNITY)
Admission: EM | Admit: 2013-03-21 | Discharge: 2013-03-21 | Disposition: A | Discharge status: Home / Self Care | Payer: PRIVATE HEALTH INSURANCE | Attending: Emergency Medicine | Admitting: Emergency Medicine

## 2013-03-21 ENCOUNTER — Encounter (HOSPITAL_COMMUNITY): Payer: Self-pay | Admitting: Emergency Medicine

## 2013-03-21 DIAGNOSIS — Z8679 Personal history of other diseases of the circulatory system: Secondary | ICD-10-CM | POA: Insufficient documentation

## 2013-03-21 DIAGNOSIS — F411 Generalized anxiety disorder: Secondary | ICD-10-CM | POA: Insufficient documentation

## 2013-03-21 DIAGNOSIS — Z8742 Personal history of other diseases of the female genital tract: Secondary | ICD-10-CM | POA: Insufficient documentation

## 2013-03-21 DIAGNOSIS — F319 Bipolar disorder, unspecified: Secondary | ICD-10-CM | POA: Insufficient documentation

## 2013-03-21 DIAGNOSIS — F17200 Nicotine dependence, unspecified, uncomplicated: Secondary | ICD-10-CM | POA: Insufficient documentation

## 2013-03-21 DIAGNOSIS — Z9089 Acquired absence of other organs: Secondary | ICD-10-CM | POA: Insufficient documentation

## 2013-03-21 DIAGNOSIS — J029 Acute pharyngitis, unspecified: Secondary | ICD-10-CM | POA: Insufficient documentation

## 2013-03-21 DIAGNOSIS — Z8719 Personal history of other diseases of the digestive system: Secondary | ICD-10-CM | POA: Insufficient documentation

## 2013-03-21 DIAGNOSIS — Z91040 Latex allergy status: Secondary | ICD-10-CM | POA: Insufficient documentation

## 2013-03-21 DIAGNOSIS — J329 Chronic sinusitis, unspecified: Secondary | ICD-10-CM

## 2013-03-21 DIAGNOSIS — H9209 Otalgia, unspecified ear: Secondary | ICD-10-CM | POA: Insufficient documentation

## 2013-03-21 DIAGNOSIS — Z79899 Other long term (current) drug therapy: Secondary | ICD-10-CM | POA: Insufficient documentation

## 2013-03-21 DIAGNOSIS — Z8619 Personal history of other infectious and parasitic diseases: Secondary | ICD-10-CM | POA: Insufficient documentation

## 2013-03-21 DIAGNOSIS — Z7982 Long term (current) use of aspirin: Secondary | ICD-10-CM | POA: Insufficient documentation

## 2013-03-21 DIAGNOSIS — IMO0002 Reserved for concepts with insufficient information to code with codable children: Secondary | ICD-10-CM | POA: Insufficient documentation

## 2013-03-21 LAB — RAPID STREP SCREEN (MED CTR MEBANE ONLY): Streptococcus, Group A Screen (Direct): NEGATIVE

## 2013-03-21 MED ORDER — AMOXICILLIN 500 MG PO CAPS: 500.0000 mg | ORAL_CAPSULE | Freq: Three times a day (TID) | ORAL | Status: DC

## 2013-03-21 MED ORDER — ALBUTEROL SULFATE HFA 108 (90 BASE) MCG/ACT IN AERS
2.0000 | INHALATION_SPRAY | RESPIRATORY_TRACT | Status: DC | PRN
  Administered 2013-03-21: 2 via RESPIRATORY_TRACT
  Filled 2013-03-21: qty 6.7

## 2013-03-21 MED ORDER — HYDROCODONE-ACETAMINOPHEN 7.5-325 MG/15ML PO SOLN: 10.0000 mL | Freq: Three times a day (TID) | ORAL | Status: DC | PRN

## 2013-03-21 NOTE — ED Notes (Signed)
Pt c/o cough, congestion intermittent fever, bilateral earache, sore throat x 2 days.

## 2013-03-21 NOTE — ED Provider Notes (Signed)
Medical screening examination/treatment/procedure(s) were performed by non-physician practitioner and as supervising physician I was immediately available for consultation/collaboration.  EKG Interpretation   None         Benny Lennert, MD 03/21/13 2342

## 2013-03-21 NOTE — ED Provider Notes (Signed)
CSN: [id number]     Arrival date & time 03/21/13  1713 History   First MD Initiated Contact with Patient 03/21/13 1950     Chief Complaint  Patient presents with  . Cough  . Nasal Congestion  . Sore Throat  . Otalgia   (Consider location/radiation/quality/duration/timing/severity/associated sxs/prior Treatment) HPI Comments: Patient presents to the emergency department with chief complaint of sinus congestion, chest congestion, cough, bilateral ear fullness, and sore throat. She states that she's been sick for approximately the past week, and began to feel better, but then after Christmas she got much worse. She denies recording a temperature, but does endorse subjective fever and chills. She is tried taking NyQuil and DayQuil with some relief. She also states that she has run out of her inhaler, and requests a refill. There no aggravating or alleviating factors.  The history is provided by the patient. No language interpreter was used.    Past Medical History  Diagnosis Date  . Polycystic ovarian syndrome   . Colitis due to Clostridium difficile 2001  . Hepatic hemangioma   . Anxiety   . IBS (irritable bowel syndrome)   . Salmonella   . GERD (gastroesophageal reflux disease)   . CIS (carcinoma in situ)     rectum  . Bipolar 1 disorder   . Migraine headache    Past Surgical History  Procedure Laterality Date  . Cholecystectomy    . Colonoscopy  05/05/2007    adenoma  . Situ removed  age 43    High-grade rectal adenoma removed from rectum   . Dilation and curettage of uterus      for vaginal bleeding  . Tubal ligation     Family History  Problem Relation Age of Onset  . Colon cancer Father 14  . Diverticulitis Mother    History  Substance Use Topics  . Smoking status: Current Every Day Smoker -- 0.50 packs/day for 18 years    Types: Cigarettes  . Smokeless tobacco: Not on file  . Alcohol Use: No   OB History   Grav Para Term Preterm Abortions TAB SAB Ect Mult  Living                 Review of Systems  All other systems reviewed and are negative.    Allergies  Shellfish allergy; Codeine; and Latex  Home Medications   Current Outpatient Rx  Name  Route  Sig  Dispense  Refill  . albuterol (VENTOLIN HFA) 108 (90 BASE) MCG/ACT inhaler   Inhalation   Inhale 2 puffs into the lungs every 6 (six) hours as needed for wheezing or shortness of breath.         Marland Kitchen amoxicillin (AMOXIL) 500 MG capsule   Oral   Take 1 capsule (500 mg total) by mouth 3 (three) times daily.   30 capsule   0   . aspirin 81 MG chewable tablet   Oral   Chew 1 tablet (81 mg total) by mouth daily.   14 tablet   0   . busPIRone (BUSPAR) 7.5 MG tablet   Oral   Take 7.5 mg by mouth 3 (three) times daily.         . diphenhydrAMINE (BENADRYL) 25 MG tablet   Oral   Take 25 mg by mouth daily as needed for itching or allergies.         Marland Kitchen FLUoxetine (PROZAC) 40 MG capsule   Oral   Take 40 mg by mouth at bedtime.         Marland Kitchen  HYDROcodone-acetaminophen (HYCET) 7.5-325 mg/15 ml solution   Oral   Take 10 mLs by mouth every 8 (eight) hours as needed for moderate pain.   120 mL   0   . ibuprofen (ADVIL,MOTRIN) 200 MG tablet   Oral   Take 800 mg by mouth every 6 (six) hours as needed for moderate pain.         . predniSONE (DELTASONE) 20 MG tablet      Take 3 tablets po qd x 2 days, then 2 tablets po qd x 2 days, then 1 tablet po qd x 2 days   12 tablet   0   . traZODone (DESYREL) 100 MG tablet   Oral   Take 100 mg by mouth at bedtime as needed for sleep.          BP 128/81  Pulse 100  Temp(Src) 98.4 F (36.9 C) (Oral)  Resp 20  Ht 5' (1.524 m)  Wt 187 lb (84.823 kg)  BMI 36.52 kg/m2  SpO2 100%  LMP 01/26/2013 Physical Exam  Nursing note and vitals reviewed. Constitutional: She is oriented to person, place, and time. She appears well-developed and well-nourished.  HENT:  Head: Normocephalic and atraumatic.  Right Ear: External ear normal.   Left Ear: External ear normal.  Mouth/Throat: Oropharynx is clear and moist. No oropharyngeal exudate.  Swollen, erythematous turbinates, maxillary sinuses tender to palpation, oropharynx is mildly erythematous, no evidence of tonsillar or peritonsillar abscess, uvula is midline, airway is intact  Eyes: Conjunctivae and EOM are normal. Pupils are equal, round, and reactive to light.  Neck: Normal range of motion. Neck supple.  Cardiovascular: Normal rate, regular rhythm and normal heart sounds.   Pulmonary/Chest: Effort normal and breath sounds normal. No respiratory distress. She has no wheezes. She has no rales. She exhibits no tenderness.  Abdominal: Soft. She exhibits no distension. There is no tenderness.  Musculoskeletal: Normal range of motion.  Lymphadenopathy:    She has cervical adenopathy.  Neurological: She is alert and oriented to person, place, and time.  Skin: Skin is warm and dry.  Psychiatric: She has a normal mood and affect. Her behavior is normal. Judgment and thought content normal.    ED Course  Procedures (including critical care time) Labs Review Labs Reviewed  RAPID STREP SCREEN  CULTURE, GROUP A STREP   Imaging Review No results found.  EKG Interpretation   None       MDM   1. Sinusitis    Patient with double sickening will treat with amoxicillin, and give cough medicine, recommend PCP followup. Will also refill the inhaler. Patient is stable and ready for discharge. Return precautions are given.    Roxy Horseman, PA-C 03/21/13 2018

## 2013-03-24 LAB — CULTURE, GROUP A STREP

## 2013-04-14 ENCOUNTER — Encounter: Payer: Self-pay | Admitting: Family Medicine

## 2013-04-14 ENCOUNTER — Ambulatory Visit (INDEPENDENT_AMBULATORY_CARE_PROVIDER_SITE_OTHER): Payer: PRIVATE HEALTH INSURANCE | Admitting: Family Medicine

## 2013-04-14 VITALS — BP 128/84 | HR 88 | Temp 97.6°F | Resp 18 | Ht 61.8 in | Wt 222.4 lb

## 2013-04-14 DIAGNOSIS — D649 Anemia, unspecified: Secondary | ICD-10-CM

## 2013-04-14 DIAGNOSIS — E669 Obesity, unspecified: Secondary | ICD-10-CM

## 2013-04-14 DIAGNOSIS — F411 Generalized anxiety disorder: Secondary | ICD-10-CM | POA: Insufficient documentation

## 2013-04-14 DIAGNOSIS — B360 Pityriasis versicolor: Secondary | ICD-10-CM

## 2013-04-14 DIAGNOSIS — J019 Acute sinusitis, unspecified: Secondary | ICD-10-CM

## 2013-04-14 DIAGNOSIS — Z0000 Encounter for general adult medical examination without abnormal findings: Secondary | ICD-10-CM

## 2013-04-14 MED ORDER — DOXYCYCLINE HYCLATE 100 MG PO TABS: 100.0000 mg | ORAL_TABLET | Freq: Two times a day (BID) | ORAL | Status: DC

## 2013-04-14 MED ORDER — CLOTRIMAZOLE 1 % EX CREA: 1.0000 "application " | TOPICAL_CREAM | Freq: Two times a day (BID) | CUTANEOUS | Status: DC

## 2013-04-14 NOTE — Patient Instructions (Signed)
Tinea Versicolor  Tinea versicolor is a common yeast infection of the skin. This condition becomes known when the yeast on our skin starts to overgrow (yeast is a normal inhabitant on our skin). This condition is noticed as white or light brown patches on brown skin, and is more evident in the summer on tanned skin. These areas are slightly scaly if scratched. The light patches from the yeast become evident when the yeast creates "holes in your suntan". This is most often noticed in the summer. The patches are usually located on the chest, back, pubis, neck and body folds. However, it may occur on any area of body. Mild itching and inflammation (redness or soreness) may be present.  DIAGNOSIS   The diagnosisof this is made clinically (by looking). Cultures from samples are usually not needed. Examination under the microscope may help. However, yeast is normally found on skin. The diagnosis still remains clinical. Examination under Mckade Gurka's Ultraviolet Light can determine the extent of the infection.  TREATMENT   This common infection is usually only of cosmetic (only a concern to your appearance). It is easily treated with dandruff shampoo used during showers or bathing. Vigorous scrubbing will eliminate the yeast over several days time. The light areas in your skin may remain for weeks or months after the infection is cured unless your skin is exposed to sunlight. The lighter or darker spots caused by the fungus that remain after complete treatment are not a sign of treatment failure; it will take a long time to resolve. Your caregiver may recommend a number of commercial preparations or medication by mouth if home care is not working. Recurrence is common and preventative medication may be necessary.  This skin condition is not highly contagious. Special care is not needed to protect close friends and family members. Normal hygiene is usually enough. Follow up is required only if you develop complications (such as a  secondary infection from scratching), if recommended by your caregiver, or if no relief is obtained from the preparations used.  Document Released: 03/09/2000 Document Revised: 06/04/2011 Document Reviewed: 04/21/2008  [employer] Patient Information 2014 [employer]

## 2013-04-14 NOTE — Progress Notes (Signed)
Subjective:    Patient ID: Martha Espinoza, female    DOB: 01-30-1966, 48 y.o.   MRN: [id number]  HPI Martha Espinoza is here to reestablish care. She use to see Dr. Ennis Forts for about 20 years before he retired from [employer]. For the last year and half she has gone to the emergency department as her primary care provider. Also 2 years ago she separated from her husband and in the process lost her health insurance. Do to family situations she has resumed living with her husband and he added her back onto his health insurance issues again covered.  She has developed a shellfish allergy in the last couple of years. She also has a history of anxiety which is followed by mental health. She takes BuSpar and Prozac for that but plans to tell them that should like to change it she doesn't feel that they helped currently. She has a history of GERD for which she takes Zantac as needed she has lost some weight and that has helped her not need as much Zantac. She admits to having obesity and says that she's continuing to work on that she had lost down to 188 pounds as regains a portion of the back over the holidays and due to the stress of living with her husband again. She had colon cancer in the year 2000 and has followed with Dr. Gwenyth Bouillon. She is due for followup with him. She's not had chemotherapy or radiation for that. She tells me that in the past she had a urine problem she's not sure if this was hematuria or proteinuria. She says that she follow up with gynecology to 2 not having any periods for many many years and having recurrent miscarriages. She now gets periods monthly and they are heavy and painful. In the past she was told she had anemia but thinks this may have been related to the colon cancer. She had a Pap smear 2 years ago and doesn't recall ever having an abnormal one. She doesn't think she's had her vitamin D checked. She is due for a mammogram.  Her primary concern today is having severe sinus headache. In  December she had what sounds like bad upper respiratory tract infection. She was seen in the emergency department on December 27 and found to have an acute sinusitis. This was treated with amoxicillin 500 mg 3 times a day. She says she felt some better but never really felt like it completely went away. 4 days ago she developed acute worse facial pain and pressure along with a fever of 101. The fever lasted for 2 days and is less today she's been taking Tylenol and ibuprofen so she's not sure if she's but had a fever without those.   Review of Systems A 12 point review of systems is negative except as per hpi.      Objective:   Physical Exam  Nursing note and vitals reviewed. Constitutional: She is oriented to person, place, and time. She appears well-developed and well-nourished.  HENT:  Right Ear: External ear normal.  Left Ear: External ear normal.  Nose: Nose normal. ttp over maxiallry sinuses Mouth/Throat: Oropharynx is clear and moist. No oropharyngeal exudate.  Eyes: Conjunctivae are normal. Pupils are equal, round, and reactive to light.  Neck: Normal range of motion. Neck supple. No thyromegaly present.  Cardiovascular: Normal rate, regular rhythm and normal heart sounds.   Pulmonary/Chest: Effort normal and breath sounds normal.  Abdominal: Soft. Bowel sounds are normal. She exhibits  no distension. There is no tenderness. There is no rebound.  Lymphadenopathy:    She has no cervical adenopathy.  Neurological: She is alert and oriented to person, place, and time. She has normal reflexes.  Skin: Skin is warm and dry. spots consistant with TV Psychiatric: She has a normal mood and affect. Her behavior is normal.        Assessment & Plan:  Martha Espinoza was seen today for new patient, nasal congestion, swelling of the face, fever and areas on skin.  Diagnoses and associated orders for this visit:  Sinusitis, acute - doxycycline (VIBRA-TABS) 100 MG tablet; Take 1 tablet (100 mg  total) by mouth 2 (two) times daily.  Anxiety state, unspecified  Obesity, unspecified - Hemoglobin A1c; Future - Lipid panel; Future - TSH; Future  Anemia - CBC with Differential; Future  Tinea versicolor - clotrimazole (LOTRIMIN) 1 % cream; Apply 1 application topically 2 (two) times daily.  Health care maintenance - MM Digital Screening; Future - Vit D  25 hydroxy (rtn osteoporosis monitoring); Future  I think that she will use headache today is from a sinus infection and will treat her with doxycycline. I've asked her to let me know if it doesn't improve. She'll followup with mental health for her anxiety, continue taking Zantac as needed for the GERD, and follow up with Dr. [name]  regarding the cancer. We'll treat her tinea versicolor as above and have her return in about a month for her comprehensive physical exam. Before that time she'll get a mammogram and get some blood work checked as above so we can discuss the results at her CPE. She'll be due for a Pap at that time and possibly T. (will have to check). Will check her urine at that time as well for protein and order blood. Regarding her shellfish allergy she says she doesn't have the money right now for an EpiPen and told her that when she is ready to let me known and happy to call in a prescription. If she needs anything before one month's time she'll let us know.

## 2013-04-15 NOTE — Addendum Note (Signed)
Addended by: Doran Heater on: 04/15/2013 10:24 AM   Modules accepted: Level of Service

## 2013-05-19 ENCOUNTER — Encounter: Payer: PRIVATE HEALTH INSURANCE | Admitting: Family Medicine

## 2013-07-02 ENCOUNTER — Ambulatory Visit (INDEPENDENT_AMBULATORY_CARE_PROVIDER_SITE_OTHER): Payer: PRIVATE HEALTH INSURANCE | Admitting: Family Medicine

## 2013-07-02 ENCOUNTER — Encounter: Payer: Self-pay | Admitting: Family Medicine

## 2013-07-02 VITALS — BP 130/80 | HR 84 | Temp 98.3°F | Resp 20 | Ht 62.5 in | Wt 228.0 lb

## 2013-07-02 DIAGNOSIS — G43909 Migraine, unspecified, not intractable, without status migrainosus: Secondary | ICD-10-CM

## 2013-07-02 DIAGNOSIS — J329 Chronic sinusitis, unspecified: Secondary | ICD-10-CM

## 2013-07-02 DIAGNOSIS — R350 Frequency of micturition: Secondary | ICD-10-CM

## 2013-07-02 LAB — POCT URINALYSIS DIPSTICK
Bilirubin, UA: NEGATIVE
Blood, UA: NEGATIVE
Glucose, UA: NEGATIVE
Ketones, UA: NEGATIVE
Nitrite, UA: NEGATIVE
Protein, UA: NEGATIVE
Spec Grav, UA: >=1.03
Urobilinogen, UA: 0.2
pH, UA: 6

## 2013-07-02 LAB — POCT URINE PREGNANCY: Preg Test, Ur: NEGATIVE

## 2013-07-02 MED ORDER — DICLOFENAC SODIUM 75 MG PO TBEC: 75.0000 mg | DELAYED_RELEASE_TABLET | Freq: Two times a day (BID) | ORAL | Status: DC | PRN

## 2013-07-02 MED ORDER — SUMATRIPTAN SUCCINATE 25 MG PO TABS: ORAL_TABLET | ORAL | Status: DC

## 2013-07-02 MED ORDER — DEXAMETHASONE SODIUM PHOSPHATE 4 MG/ML IJ SOLN
4.0000 mg | Freq: Once | INTRAMUSCULAR | Status: AC
  Administered 2013-07-02: 4 mg via INTRAMUSCULAR

## 2013-07-02 MED ORDER — FLUTICASONE PROPIONATE 50 MCG/ACT NA SUSP: 2.0000 | Freq: Every day | NASAL | Status: DC

## 2013-07-02 NOTE — Progress Notes (Signed)
Subjective:     Patient ID: Martha Espinoza, female   DOB: [birth year], 48 y.o.   MRN: [id number]  HPI Comments: Sinus Pain Patient complains of congestion, cough, headaches, nasal congestion and sinus pressure. Onset of symptoms was 1 week ago. Symptoms have been gradually worsening since that time. She is drinking plenty of fluids.  Past history is significant for chronic bronchitis and tobacco abuse <1/2 ppd. Patient is smoker  (<1/2 ppd x ).     Urinary Frequency  This is a new problem. The current episode started in the past 7 days. The problem occurs intermittently. The problem has been gradually worsening. The pain is at a severity of 5/10. The pain is moderate. There has been no fever. She is not sexually active. Associated symptoms include frequency and nausea. Pertinent negatives include no chills, discharge, flank pain, hematuria, hesitancy, sweats, urgency or vomiting. She has tried nothing for the symptoms. Her past medical history is significant for recurrent UTIs.  Migraine  This is a chronic problem. The current episode started more than 1 year ago. The problem occurs intermittently. The problem has been gradually worsening. The pain is located in the frontal region. The pain does not radiate. The pain quality is similar to prior headaches. The quality of the pain is described as shooting. The pain is at a severity of 4/10. The pain is mild. Associated symptoms include nausea, rhinorrhea and sinus pressure. Pertinent negatives include no back pain, coughing, dizziness, fever, numbness or vomiting. The symptoms are aggravated by activity, noise and Valsalva. Treatments tried: she says she use to be on Lamictal. The treatment provided no relief. Her past medical history is significant for migraine headaches.   Past Medical History  Diagnosis Date  . Polycystic ovarian syndrome   . Colitis due to Clostridium difficile 2001  . Hepatic hemangioma   . Anxiety   . IBS (irritable bowel  syndrome)   . Salmonella   . GERD (gastroesophageal reflux disease)   . CIS (carcinoma in situ)     rectum  . Bipolar 1 disorder   . Migraine headache    Current Outpatient Prescriptions on File Prior to Visit  Medication Sig Dispense Refill  . albuterol (VENTOLIN HFA) 108 (90 BASE) MCG/ACT inhaler Inhale 2 puffs into the lungs every 6 (six) hours as needed for wheezing or shortness of breath.      Marland Kitchen aspirin 81 MG chewable tablet Chew 1 tablet (81 mg total) by mouth daily.  14 tablet  0  . busPIRone (BUSPAR) 7.5 MG tablet Take 7.5 mg by mouth 3 (three) times daily.      . diphenhydrAMINE (BENADRYL) 25 MG tablet Take 25 mg by mouth daily as needed for itching or allergies.      Marland Kitchen doxycycline (VIBRA-TABS) 100 MG tablet Take 1 tablet (100 mg total) by mouth 2 (two) times daily.  20 tablet  0  . FLUoxetine (PROZAC) 40 MG capsule Take 40 mg by mouth at bedtime.      . traZODone (DESYREL) 100 MG tablet Take 100 mg by mouth at bedtime as needed for sleep.      . [DISCONTINUED] citalopram (CELEXA) 40 MG tablet Take 20 mg by mouth at bedtime.       . [DISCONTINUED] omeprazole (PRILOSEC) 20 MG capsule Take 40 mg by mouth daily.      . [DISCONTINUED] promethazine (PHENERGAN) 25 MG tablet Take 25 mg by mouth every 6 (six) hours as needed. For nausea associated with migraines  No current facility-administered medications on file prior to visit.   Allergies  Allergen Reactions  . Shellfish Allergy Anaphylaxis  . Codeine Nausea And Vomiting    Hallucinations, Also sees people that are not there   . Latex Hives     Review of Systems  Constitutional: Negative for fever, chills, activity change, fatigue and unexpected weight change.  HENT: Positive for congestion, rhinorrhea, sinus pressure and sneezing.   Eyes: Negative for discharge and visual disturbance.  Respiratory: Positive for shortness of breath. Negative for cough and wheezing.   Gastrointestinal: Positive for nausea. Negative for  vomiting, diarrhea and constipation.  Endocrine: Positive for polydipsia and polyuria.  Genitourinary: Positive for frequency. Negative for dysuria, hesitancy, urgency, hematuria, flank pain, decreased urine volume, difficulty urinating, genital sores and pelvic pain.  Musculoskeletal: Negative for back pain.  Neurological: Positive for headaches. Negative for dizziness and numbness.  Psychiatric/Behavioral: Negative for confusion and agitation.       Objective:   Physical Exam  Nursing note and vitals reviewed. Constitutional: She is oriented to person, place, and time. She appears well-developed and well-nourished.  HENT:  Head: Normocephalic and atraumatic.  Right Ear: External ear normal.  Left Ear: External ear normal.  Mouth/Throat: Oropharynx is clear and moist.  Boggy nasal turbinates R>L  Eyes: Pupils are equal, round, and reactive to light.  Cardiovascular: Normal rate, regular rhythm and normal heart sounds.   Pulmonary/Chest: Effort normal and breath sounds normal.  Abdominal: Soft. Bowel sounds are normal.  Neurological: She is alert and oriented to person, place, and time.  Skin: Skin is warm and dry.  Psychiatric: She has a normal mood and affect. Her behavior is normal. Judgment and thought content normal.       Assessment:     Martha Espinoza was seen today for sinusitis and urinary tract infection.  Diagnoses and associated orders for this visit:  Sinusitis - dexamethasone (DECADRON) injection 4 mg; Inject 1 mL (4 mg total) into the muscle once. - POCT urinalysis dipstick - POCT urine pregnancy  Urinary frequency - Urine culture  Migraine  Other Orders - diclofenac (VOLTAREN) 75 MG EC tablet; Take 1 tablet (75 mg total) by mouth 2 (two) times daily as needed. - fluticasone (FLONASE) 50 MCG/ACT nasal spray; Place 2 sprays into both nostrils daily.       Plan:    Have given decadron IM injection for sinus symptoms. Also to continue the zyrtec at bedtime and  will add flonase to her regimen.   Urine did have +1 leukocytes but everything else was negative. Will send for urine culture. She did report some polyuria and polydipsia but declines test for diabetes today.  Sent in diclofenac for her acute migraines and triptan for preventive.

## 2013-07-02 NOTE — Patient Instructions (Signed)
Fluticasone nasal solution What is this medicine? FLUTICASONE (floo TIK a sone) is a corticosteroid. It helps decrease inflammation in your nose. This medicine is used to treat the symptoms of allergies like sneezing, itching, and runny or stuffy nose. This medicine may be used for other purposes; ask your health care provider or pharmacist if you have questions. COMMON BRAND NAME(S): Flonase What should I tell my health care provider before I take this medicine? They need to know if you have any of these conditions: -infection, like tuberculosis, herpes, or fungal infection -recent surgery on nose or sinuses -taking corticosteroid by mouth -an unusual or allergic reaction to fluticasone, steroids, other medicines, foods, dyes, or preservatives -pregnant or trying to get pregnant -breast-feeding How should I use this medicine? This medicine is for use in the nose. Follow the directions on your prescription label. This medicine works best if used regularly. Do not use more often than directed. Make sure that you are using your nasal spray correctly. Ask you doctor or health care provider if you have any questions. Talk to your pediatrician regarding the use of this medicine in children. While this drug may be prescribed for children as young as 4 years old for selected conditions, precautions do apply. Overdosage: If you think you have taken too much of this medicine contact a poison control center or emergency room at once. NOTE: This medicine is only for you. Do not share this medicine with others. What if I miss a dose? If you miss a dose, use it as soon as you remember. If it is almost time for your next dose, use only that dose and continue with your regular schedule. Do not use double or extra doses. What may interact with this medicine? -ketoconazole -metyrapone -some medicines for HIV -vaccines This list may not describe all possible interactions. Give your health care provider a list  of all the medicines, herbs, non-prescription drugs, or dietary supplements you use. Also tell them if you smoke, drink alcohol, or use illegal drugs. Some items may interact with your medicine. What should I watch for while using this medicine? Visit your doctor or health care professional for regular checks on your progress. Some symptoms may improve within 12 hours after starting use. Check with your doctor or health care professional if there is no improvement in your condition after 3 weeks of use. Do not come in contact with people who have chickenpox or the measles while you are taking this medicine. If you do, call your doctor right away. What side effects may I notice from receiving this medicine? Side effects that you should report to your doctor or health care professional as soon as possible: -allergic reactions like skin rash, itching or hives, swelling of the face, lips, or tongue -changes in vision -flu-like symptoms -white patches or sores in the mouth or nose Side effects that usually do not require medical attention (report to your doctor or health care professional if they continue or are bothersome): -burning or irritation inside the nose or throat -cough -headache -nosebleed -unusual taste or smell This list may not describe all possible side effects. Call your doctor for medical advice about side effects. You may report side effects to FDA at 1-800-FDA-1088. Where should I keep my medicine? Keep out of the reach of children. Store at room temperature between 15 and 30 degrees C (59 and 86 degrees F). Throw away any unused medicine after the expiration date. NOTE: This sheet is a summary. It may   not cover all possible information. If you have questions about this medicine, talk to your doctor, pharmacist, or health care provider.  2014, Elsevier/Gold Standard. (2008-02-24 10:40:16) Diclofenac immediate-release tablets What is this medicine? DICLOFENAC (dye KLOE fen ak)  is a non-steroidal anti-inflammatory drug (NSAID). It is used to reduce swelling and to treat pain. It may be used to treat osteoarthritis, rheumatoid arthritis, mild to moderate pain, and painful monthly periods. This medicine may be used for other purposes; ask your health care provider or pharmacist if you have questions. COMMON BRAND NAME(S): Cataflam What should I tell my health care provider before I take this medicine? They need to know if you have any of these conditions: -asthma, especially aspirin sensitive asthma -coronary artery bypass graft (CABG) surgery within the past 2 weeks -drink more than 3 alcohol containing drinks a day -heart disease or circulation problems like heart failure or leg edema (fluid retention) -high blood pressure -kidney disease -liver disease -stomach bleeding or ulcers -an unusual or allergic reaction to diclofenac, aspirin, other NSAIDs, other medicines, foods, dyes, or preservatives -pregnant or trying to get pregnant -breast-feeding How should I use this medicine? Take this medicine by mouth with food and with a full glass of water. Follow the directions on the prescription label. Take your medicine at regular intervals. Do not take your medicine more often than directed. Long-term, continuous use may increase the risk of heart attack or stroke. A special MedGuide will be given to you by the pharmacist with each prescription and refill. Be sure to read this information carefully each time. Talk to your pediatrician regarding the use of this medicine in children. Special care may be needed. Elderly patients over 46 years old may have a stronger reaction and need a smaller dose. Overdosage: If you think you have taken too much of this medicine contact a poison control center or emergency room at once. NOTE: This medicine is only for you. Do not share this medicine with others. What if I miss a dose? If you miss a dose, take it as soon as you can. If it  is almost time for your next dose, take only that dose. Do not take double or extra doses. What may interact with this medicine? Do not take this medicine with any of the following medications: -cidofovir -ketorolac -methotrexate This medicine may also interact with the following medications: -alcohol -aspirin and aspirin-like medicines -cyclosporine -diuretics -lithium -medicines for blood pressure -medicines for osteoporosis -medicines that affect platelets -medicines that treat or prevent blood clots like warfarin -NSAIDs, medicines for pain and inflammation, like ibuprofen or naproxen -pemetrexed -steroid medicines like prednisone or cortisone This list may not describe all possible interactions. Give your health care provider a list of all the medicines, herbs, non-prescription drugs, or dietary supplements you use. Also tell them if you smoke, drink alcohol, or use illegal drugs. Some items may interact with your medicine. What should I watch for while using this medicine? Tell your doctor or health care professional if your pain does not get better. Talk to your doctor before taking another medicine for pain. Do not treat yourself. This medicine does not prevent heart attack or stroke. In fact, this medicine may increase the chance of a heart attack or stroke. The chance may increase with longer use of this medicine and in people who have heart disease. If you take aspirin to prevent heart attack or stroke, talk with your doctor or health care professional. Do not take medicines such as  ibuprofen and naproxen with this medicine. Side effects such as stomach upset, nausea, or ulcers may be more likely to occur. Many medicines available without a prescription should not be taken with this medicine. This medicine can cause ulcers and bleeding in the stomach and intestines at any time during treatment. Do not smoke cigarettes or drink alcohol. These increase irritation to your stomach and  can make it more susceptible to damage from this medicine. Ulcers and bleeding can happen without warning symptoms and can cause death. You may get drowsy or dizzy. Do not drive, use machinery, or do anything that needs mental alertness until you know how this medicine affects you. Do not stand or sit up quickly, especially if you are an older patient. This reduces the risk of dizzy or fainting spells. This medicine can cause you to bleed more easily. Try to avoid damage to your teeth and gums when you brush or floss your teeth. What side effects may I notice from receiving this medicine? Side effects that you should report to your doctor or health care professional as soon as possible: -allergic reactions like skin rash, itching or hives, swelling of the face, lips, or tongue -black or bloody stools, blood in the urine or vomit -blurred vision -chest pain -difficulty breathing or wheezing -nausea or vomiting -rash or fever -redness, blistering, peeling or loosening of the skin, including inside the mouth -slurred speech or weakness on one side of the body -unexplained weight gain or swelling -unusually weak or tired -yellowing of eyes or skin Side effects that usually do not require medical attention (report to your doctor or health care professional if they continue or are bothersome): -constipation -diarrhea -dizziness -headache -heartburn This list may not describe all possible side effects. Call your doctor for medical advice about side effects. You may report side effects to FDA at 1-800-FDA-1088. Where should I keep my medicine? Keep out of the reach of children. Store at room temperature below 30 degrees C (86 degrees F). Protect from moisture. Keep container tightly closed. Throw away any unused medicine after the expiration date. NOTE: This sheet is a summary. It may not cover all possible information. If you have questions about this medicine, talk to your doctor, pharmacist, or  health care provider.  2014, Elsevier/Gold Standard. (2008-07-30 14:28:04)

## 2013-07-04 LAB — URINE CULTURE
Colony Count: NO GROWTH
Organism ID, Bacteria: NO GROWTH

## 2013-07-06 ENCOUNTER — Telehealth: Payer: Self-pay | Admitting: *Deleted

## 2013-07-06 NOTE — Telephone Encounter (Signed)
Pt called and left VM asking for results from urine lab that was collected on friday

## 2013-07-07 ENCOUNTER — Telehealth: Payer: Self-pay | Admitting: *Deleted

## 2013-07-07 NOTE — Telephone Encounter (Signed)
Pt called this morning about urine results, she stated that she was worse today and had abdominal pain and nausea. Pt was informed that urine was negative per MD and that if she was worse she needed to be seen and was offered an appointment for today. Pt stated "No, I will wait and see how I feel later".

## 2013-07-17 ENCOUNTER — Telehealth: Payer: Self-pay | Admitting: *Deleted

## 2013-07-17 NOTE — Telephone Encounter (Signed)
Pt called with concerns of being exposed to scabies. Return pt called left message to contact office.

## 2013-08-07 ENCOUNTER — Encounter: Payer: Self-pay | Admitting: Nurse Practitioner

## 2013-08-07 ENCOUNTER — Ambulatory Visit (INDEPENDENT_AMBULATORY_CARE_PROVIDER_SITE_OTHER): Payer: PRIVATE HEALTH INSURANCE | Admitting: Nurse Practitioner

## 2013-08-07 VITALS — BP 146/85 | HR 89 | Temp 98.2°F | Ht 62.0 in | Wt 234.0 lb

## 2013-08-07 DIAGNOSIS — J329 Chronic sinusitis, unspecified: Secondary | ICD-10-CM

## 2013-08-07 DIAGNOSIS — R8290 Unspecified abnormal findings in urine: Secondary | ICD-10-CM

## 2013-08-07 DIAGNOSIS — R82998 Other abnormal findings in urine: Secondary | ICD-10-CM

## 2013-08-07 LAB — POCT URINALYSIS DIPSTICK
Bilirubin, UA: NEGATIVE
Glucose, UA: NEGATIVE
Leukocytes, UA: NEGATIVE
Nitrite, UA: NEGATIVE
Protein, UA: NEGATIVE
Spec Grav, UA: >=1.03
Urobilinogen, UA: 0.2
pH, UA: 6

## 2013-08-07 NOTE — Progress Notes (Signed)
Pre visit review using our clinic review tool, if applicable. No additional management support is needed unless otherwise documented below in the visit note. 

## 2013-08-07 NOTE — Progress Notes (Signed)
Subjective:     Martha Espinoza is a 48 y.o. female wishes to establish care. She was a patient of Dr Anitra Lauth when he was at another practice. Today she has multiple complaints of sinus drainage, malodorous urine, and back pain. She is a smoker and has a Hx of rectal carcinoma in situ. Regarding  Sinus symptoms: she c/o nasal drainage, HA, presssure behind eyes, fever & sore throat for several days. She denies cough. She is not taking anything for symptoms. Regarding malodorous urine, she noticed this yesterday. She denies dysuria, hesitancy or urgency, no abdominal pain, fever or nausea. She c/o low back pain.    The following portions of the patient's history were reviewed and updated as appropriate: allergies, current medications, past family history, past medical history, past social history, past surgical history and problem list.  Review of Systems Pertinent items are noted in HPI.    Objective:    BP 146/85  Pulse 89  Temp(Src) 98.2 F (36.8 C) (Temporal)  Ht 5\' 2"  (1.575 m)  Wt 234 lb (106.142 kg)  BMI 42.79 kg/m2  SpO2 98% BP 146/85  Pulse 89  Temp(Src) 98.2 F (36.8 C) (Temporal)  Ht 5\' 2"  (1.575 m)  Wt 234 lb (106.142 kg)  BMI 42.79 kg/m2  SpO2 98% General appearance: alert, cooperative, appears stated age, no distress and smells strong of cigarette smoke. Head: Normocephalic, without obvious abnormality, atraumatic Eyes: negative findings: lids and lashes normal and conjunctivae and sclerae normal Ears: normal TM's and external ear canals both ears Nose: Nares normal. Septum midline. Mucosa normal. No drainage or sinus tenderness. Throat: lips, mucosa, and tongue normal; teeth and gums normal Neck: no adenopathy, supple, symmetrical, trachea midline and thyroid not enlarged, symmetric, no tenderness/mass/nodules Back: symmetric, no curvature. ROM normal. No CVA tenderness. Lungs: clear to auscultation bilaterally Heart: regular rate and rhythm, S1, S2 normal, no  murmur, click, rub or gallop Abdomen: soft, non-tender; bowel sounds normal; no masses,  no organomegaly    Assessment:  1. Malodorous urine Likely due to concentrated. SG is 1.030 - Urine culture-pending - POCT urinalysis dipstick-no leuks or nites, tr blood - Urinalysis, Routine w reflex microscopic  2. Unspecified sinusitis (chronic) Viral, allergenic, cigarette smoke irritation Rinse sinuses daily. Chew nicotene gum to smoke fewer cigarettes.

## 2013-08-07 NOTE — Patient Instructions (Signed)
Your sinuses are constantly irritated by cigarette smoke. You may have an allergy or virus overlap making symptoms worse. Start washing your sinuses every day for best sinus health (Neilmed sinus Rinse-cheapest at [facility]). Consider chewing nicotene gum to smoke fewer cigarettes to decrease sinus irritation and lower your risks for cancer, heart disease, & stroke. Your urine is concentrated. You are dehydrated. Please sip hydrating fluids every-water, colorless soda, club soda, decaf tea. Limit caffeinated beverages & alcohol as they dehydrate you.  Our office will call you with lab results and any needed follow up.  If you do not feel better in 1 week, please call us.

## 2013-08-08 LAB — URINALYSIS, ROUTINE W REFLEX MICROSCOPIC
Bilirubin Urine: NEGATIVE
Glucose, UA: NEGATIVE mg/dL
Hgb urine dipstick: NEGATIVE
Ketones, ur: NEGATIVE mg/dL
Leukocytes, UA: NEGATIVE
Nitrite: NEGATIVE
Protein, ur: NEGATIVE mg/dL
Specific Gravity, Urine: 1.03 (ref 1.005–1.030)
Urobilinogen, UA: 1 mg/dL (ref 0.0–1.0)
pH: 6 (ref 5.0–8.0)

## 2013-08-08 LAB — URINALYSIS, MICROSCOPIC ONLY
Bacteria, UA: NONE SEEN
Casts: NONE SEEN

## 2013-08-09 LAB — URINE CULTURE: Colony Count: 30000

## 2013-08-10 ENCOUNTER — Telehealth: Payer: Self-pay | Admitting: Nurse Practitioner

## 2013-08-10 ENCOUNTER — Telehealth: Payer: Self-pay | Admitting: Family Medicine

## 2013-08-10 NOTE — Telephone Encounter (Signed)
pls call pt: Advise No UTI 

## 2013-08-10 NOTE — Telephone Encounter (Signed)
Spoke with pt, advised lab results. Pt understood. 

## 2013-08-10 NOTE — Telephone Encounter (Signed)
Relevant patient education mailed to patient.  

## 2013-12-02 ENCOUNTER — Ambulatory Visit (INDEPENDENT_AMBULATORY_CARE_PROVIDER_SITE_OTHER): Payer: PRIVATE HEALTH INSURANCE | Admitting: Family Medicine

## 2013-12-02 ENCOUNTER — Encounter: Payer: Self-pay | Admitting: Family Medicine

## 2013-12-02 VITALS — BP 144/94 | HR 95 | Temp 98.0°F | Resp 16 | Ht 62.0 in | Wt 236.0 lb

## 2013-12-02 DIAGNOSIS — J209 Acute bronchitis, unspecified: Secondary | ICD-10-CM

## 2013-12-02 DIAGNOSIS — J018 Other acute sinusitis: Secondary | ICD-10-CM

## 2013-12-02 DIAGNOSIS — F17200 Nicotine dependence, unspecified, uncomplicated: Secondary | ICD-10-CM

## 2013-12-02 DIAGNOSIS — J208 Acute bronchitis due to other specified organisms: Secondary | ICD-10-CM

## 2013-12-02 MED ORDER — ALBUTEROL SULFATE HFA 108 (90 BASE) MCG/ACT IN AERS: 2.0000 | INHALATION_SPRAY | Freq: Four times a day (QID) | RESPIRATORY_TRACT | Status: DC | PRN

## 2013-12-02 MED ORDER — PREDNISONE 20 MG PO TABS: ORAL_TABLET | ORAL | Status: DC

## 2013-12-02 MED ORDER — AMOXICILLIN 875 MG PO TABS: 875.0000 mg | ORAL_TABLET | Freq: Two times a day (BID) | ORAL | Status: AC

## 2013-12-02 NOTE — Progress Notes (Signed)
Pre visit review using our clinic review tool, if applicable. No additional management support is needed unless otherwise documented below in the visit note. 

## 2013-12-02 NOTE — Progress Notes (Signed)
OFFICE NOTE  12/02/2013  CC:  Chief Complaint  Patient presents with  . Sinus Problem   HPI: Patient is a 48 y.o. Caucasian female who is here for 1 day of head congestion, PND, stuffy nose, voice hoarse, no fever. Cough, slight wheeze and SOB this morning.  Has no albuterol to use.   Still smoking.  Room smells like cigarettes.  Pertinent PMH:  Past Medical History  Diagnosis Date  . Polycystic ovarian syndrome   . Colitis due to Clostridium difficile 2001  . Hepatic hemangioma   . Anxiety   . IBS (irritable bowel syndrome)   . Salmonella   . GERD (gastroesophageal reflux disease)   . CIS (carcinoma in situ)     rectum  . Bipolar 1 disorder   . Migraine headache   . Asthma   . Arthritis   . Blood transfusion without reported diagnosis   . Depression   . Hypertension   . Allergy   . COPD (chronic obstructive pulmonary disease)     MEDS:  Outpatient Prescriptions Prior to Visit  Medication Sig Dispense Refill  . albuterol (VENTOLIN HFA) 108 (90 BASE) MCG/ACT inhaler Inhale 2 puffs into the lungs every 6 (six) hours as needed for wheezing or shortness of breath.      Marland Kitchen aspirin 81 MG chewable tablet Chew 1 tablet (81 mg total) by mouth daily.  14 tablet  0  . busPIRone (BUSPAR) 7.5 MG tablet Take 7.5 mg by mouth 3 (three) times daily.      . diclofenac (VOLTAREN) 75 MG EC tablet Take 1 tablet (75 mg total) by mouth 2 (two) times daily as needed.  60 tablet  0  . diphenhydrAMINE (BENADRYL) 25 MG tablet Take 25 mg by mouth daily as needed for itching or allergies.      . fluticasone (FLONASE) 50 MCG/ACT nasal spray Place 2 sprays into both nostrils daily.  16 g  6  . lamoTRIgine (LAMICTAL) 200 MG tablet Take 200 mg by mouth daily.      . traZODone (DESYREL) 100 MG tablet Take 100 mg by mouth at bedtime as needed for sleep.      . SUMAtriptan (IMITREX) 25 MG tablet Take 1 tab po at the first sign of migraine. May repeat in 2 hours if headache persists or recurs.  10 tablet  0    No facility-administered medications prior to visit.    PE: Blood pressure 144/94, pulse 95, temperature 98 F (36.7 C), temperature source Temporal, resp. rate 16, height 5\' 2"  (1.575 m), weight 236 lb (107.049 kg), SpO2 98.00%. VS: noted Gen: alert, NAD, tired but NONTOXIC APPEARING. HEENT: eyes without injection, drainage, or swelling.  Ears: EACs clear, TMs with normal light reflex and landmarks.  Nose: Clear rhinorrhea, with some dried, crusty exudate adherent to mildly injected mucosa.  No purulent d/c.  Bilateral diffuse paranasal sinus TTP.  No facial swelling.  Throat and mouth without focal lesion.  No pharyngial swelling, erythema, or exudate.   Neck: supple, no LAD.   LUNGS: CTA bilat, nonlabored resps, lots of post-exhalation coughing.  Good aeration.   CV: RRR, no m/r/g. EXT: no c/c/e SKIN: no rash  LABS: none  IMPRESSION AND PLAN:  Acute sinusitis and bronchitis--mild RAD. Ongoing tobacco abuse. Encouraged tobacco cessation. Amoxil 875 mg bid x 10d.  Prednisone 40mg  qd x 5d. Albuterol HFA RF.  An After Visit Summary was printed and given to the patient.  FOLLOW UP: prn

## 2014-01-14 ENCOUNTER — Telehealth: Payer: Self-pay | Admitting: Family Medicine

## 2014-01-14 ENCOUNTER — Encounter: Payer: Self-pay | Admitting: Nurse Practitioner

## 2014-01-14 ENCOUNTER — Ambulatory Visit (INDEPENDENT_AMBULATORY_CARE_PROVIDER_SITE_OTHER): Payer: 59 | Admitting: Nurse Practitioner

## 2014-01-14 VITALS — BP 115/81 | HR 102 | Temp 98.0°F | Ht 62.0 in | Wt 239.0 lb

## 2014-01-14 DIAGNOSIS — G43011 Migraine without aura, intractable, with status migrainosus: Secondary | ICD-10-CM | POA: Diagnosis not present

## 2014-01-14 MED ORDER — ELETRIPTAN HYDROBROMIDE 40 MG PO TABS: 40.0000 mg | ORAL_TABLET | Freq: Once | ORAL | Status: DC

## 2014-01-14 MED ORDER — PREDNISONE 20 MG PO TABS: 20.0000 mg | ORAL_TABLET | Freq: Every day | ORAL | Status: DC

## 2014-01-14 NOTE — Telephone Encounter (Signed)
Caller: Martha Espinoza/Patient; Phone: [phone]; Reason for Call: Pt states she was seen in office today and prescribed a medication for her headache and states when she got to pharm the med was $117 and she cannot afford the medication.  She would like to know if anything else can be prescribed that may be cheaper.  Assured her will send message and someone will f/u with her today.

## 2014-01-14 NOTE — Telephone Encounter (Signed)
Please advise 

## 2014-01-14 NOTE — Progress Notes (Signed)
Subjective:    Martha Espinoza is a 48 y.o. female who presents for evaluation of headache. Symptoms began about 2 days ago. She has been taking 5T ibuprophen every 4 to 5 hours for 2 days without relief. Associated symptoms: light sensitivity & dizziness. She has long-standing Hx of MHA occuring weekly. She has used imitrex in past with no relief. She denies fever, nasal congestion, cough. She is a smoker. She was treated for URI 1 mo ago She has known food allergy: shrimp. She ate shrimp about 4 nights ago-she states she took 4 benadryl before she went to restaurant. Counseled pt against taking high doses ibuprophen & avoiding food allergens.  The following portions of the patient's history were reviewed and updated as appropriate: allergies, current medications, past family history, past medical history, past social history, past surgical history and problem list.  Review of Systems Pertinent items are noted in HPI.    Objective:    BP 115/81  Pulse 102  Temp(Src) 98 F (36.7 C) (Temporal)  Ht 5\' 2"  (1.575 m)  Wt 239 lb (108.41 kg)  BMI 43.70 kg/m2  SpO2 95% General appearance: alert, cooperative, appears stated age and no distress Heavy smell of cigarette smoke Head: Normocephalic, without obvious abnormality, atraumatic Eyes: negative findings: lids and lashes normal, conjunctivae and sclerae normal, corneas clear and pupils equal, round, reactive to light and accomodation Ears: normal TM's and external ear canals both ears Throat: lips, mucosa, and tongue normal; teeth and gums normal Lungs: clear to auscultation bilaterally Heart: regular rate and rhythm, S1, S2 normal, no murmur, click, rub or gallop Lymph nodes: shoddy anterior cervical nodes, tender R node, moveable Neurologic: Grossly normal    Assessment:   1. Intractable migraine without aura and with status migrainosus - eletriptan (RELPAX) 40 MG tablet; Take 1 tablet (40 mg total) by mouth once. One tablet by mouth  at onset of headache. May repeat in 2 hours if headache persists or recurs.  Dispense: 10 tablet; Refill: 0 - predniSONE (DELTASONE) 20 MG tablet; Take 1 tablet (20 mg total) by mouth daily with breakfast.  Dispense: 2 tablet; Refill: 0  See pt instructions. F/u 1 week

## 2014-01-14 NOTE — Progress Notes (Signed)
Pre visit review using our clinic review tool, if applicable. No additional management support is needed unless otherwise documented below in the visit note. 

## 2014-01-14 NOTE — Patient Instructions (Signed)
Take prednisone today.  If you still have headache in 3 hours take relpax.  If you still have headache 2 hours later, take second dose relpax.  Stop ibuprophen.  Avoid known food allergies as they can trigger migraines.  Keep sleep schedule consistent: go to sleep & get up within same hour every night & day, as sleep schedule changes can trigger migraines.  Follow up in 1 week or sooner if you get no relief.  Nice to see you.

## 2014-01-15 ENCOUNTER — Other Ambulatory Visit: Payer: Self-pay | Admitting: Nurse Practitioner

## 2014-01-15 DIAGNOSIS — G43009 Migraine without aura, not intractable, without status migrainosus: Secondary | ICD-10-CM

## 2014-01-15 MED ORDER — RIZATRIPTAN BENZOATE 10 MG PO TABS: 10.0000 mg | ORAL_TABLET | ORAL | Status: DC | PRN

## 2014-01-15 NOTE — Progress Notes (Signed)
Patient notified

## 2014-02-23 ENCOUNTER — Encounter: Payer: Self-pay | Admitting: Family Medicine

## 2014-02-23 ENCOUNTER — Ambulatory Visit (INDEPENDENT_AMBULATORY_CARE_PROVIDER_SITE_OTHER): Payer: 59 | Admitting: Family Medicine

## 2014-02-23 VITALS — BP 113/80 | HR 98 | Temp 98.2°F | Resp 18 | Ht 62.0 in | Wt 239.0 lb

## 2014-02-23 DIAGNOSIS — F4322 Adjustment disorder with anxiety: Secondary | ICD-10-CM | POA: Diagnosis not present

## 2014-02-23 DIAGNOSIS — L299 Pruritus, unspecified: Secondary | ICD-10-CM

## 2014-02-23 MED ORDER — CLONAZEPAM 0.5 MG PO TABS: ORAL_TABLET | ORAL | Status: DC
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
## 2014-02-23 NOTE — Patient Instructions (Signed)
Buy generic OTC allegra (fexofenadine) 180mg  and take 1 tab once a day for itching. Stop topical benadryl cream.

## 2014-02-23 NOTE — Progress Notes (Signed)
Pre visit review using our clinic review tool, if applicable. No additional management support is needed unless otherwise documented below in the visit note. 

## 2014-03-03 ENCOUNTER — Telehealth: Payer: Self-pay | Admitting: Family Medicine

## 2014-03-03 NOTE — Telephone Encounter (Signed)
Pls make sure she is still not having any significant rash. I recommend she take 2 of the 0.5mg  clonazepam tabs THREE times per day.

## 2014-03-03 NOTE — Telephone Encounter (Signed)
Spoke with pt, she started having a rash around her wrist and back. The itching is happening in the same spots again and she is thinking it is her Lamictal. She is trying to contact her doctor to see how she can ween off this medication. I explained your instructions. Pt understood.

## 2014-03-03 NOTE — Telephone Encounter (Signed)
Martha Espinoza called and said that the medication Dr. Anitra Lauth gave her for the itching she had did well at first but she is itching bad again and wants to know what to do.

## 2014-03-06 ENCOUNTER — Encounter (HOSPITAL_COMMUNITY): Payer: Self-pay

## 2014-03-06 DIAGNOSIS — Z85048 Personal history of other malignant neoplasm of rectum, rectosigmoid junction, and anus: Secondary | ICD-10-CM | POA: Insufficient documentation

## 2014-03-06 DIAGNOSIS — Z8742 Personal history of other diseases of the female genital tract: Secondary | ICD-10-CM | POA: Diagnosis not present

## 2014-03-06 DIAGNOSIS — R5383 Other fatigue: Secondary | ICD-10-CM | POA: Insufficient documentation

## 2014-03-06 DIAGNOSIS — Z7982 Long term (current) use of aspirin: Secondary | ICD-10-CM | POA: Diagnosis not present

## 2014-03-06 DIAGNOSIS — R51 Headache: Secondary | ICD-10-CM | POA: Diagnosis not present

## 2014-03-06 DIAGNOSIS — Z91040 Latex allergy status: Secondary | ICD-10-CM | POA: Insufficient documentation

## 2014-03-06 DIAGNOSIS — Z79899 Other long term (current) drug therapy: Secondary | ICD-10-CM | POA: Insufficient documentation

## 2014-03-06 DIAGNOSIS — Z8619 Personal history of other infectious and parasitic diseases: Secondary | ICD-10-CM | POA: Diagnosis not present

## 2014-03-06 DIAGNOSIS — F419 Anxiety disorder, unspecified: Secondary | ICD-10-CM | POA: Insufficient documentation

## 2014-03-06 DIAGNOSIS — L299 Pruritus, unspecified: Secondary | ICD-10-CM | POA: Insufficient documentation

## 2014-03-06 DIAGNOSIS — R21 Rash and other nonspecific skin eruption: Secondary | ICD-10-CM | POA: Diagnosis present

## 2014-03-06 DIAGNOSIS — Z720 Tobacco use: Secondary | ICD-10-CM | POA: Diagnosis not present

## 2014-03-06 DIAGNOSIS — J449 Chronic obstructive pulmonary disease, unspecified: Secondary | ICD-10-CM | POA: Diagnosis not present

## 2014-03-06 DIAGNOSIS — I10 Essential (primary) hypertension: Secondary | ICD-10-CM | POA: Diagnosis not present

## 2014-03-06 DIAGNOSIS — M1990 Unspecified osteoarthritis, unspecified site: Secondary | ICD-10-CM | POA: Insufficient documentation

## 2014-03-06 DIAGNOSIS — F319 Bipolar disorder, unspecified: Secondary | ICD-10-CM | POA: Diagnosis not present

## 2014-03-06 NOTE — ED Notes (Signed)
Patient has been dealing with the rash for 4 weeks.

## 2014-03-06 NOTE — ED Notes (Signed)
I have a rash all over; changed from Lamictal to trileptal. Itching all over. Told me not to use benadryl or cream.

## 2014-03-07 ENCOUNTER — Emergency Department (HOSPITAL_COMMUNITY)
Admission: EM | Admit: 2014-03-07 | Discharge: 2014-03-07 | Disposition: A | Discharge status: Home / Self Care | Payer: PRIVATE HEALTH INSURANCE | Attending: Emergency Medicine | Admitting: Emergency Medicine

## 2014-03-07 DIAGNOSIS — L299 Pruritus, unspecified: Secondary | ICD-10-CM

## 2014-03-07 MED ORDER — HYDROXYZINE HCL 25 MG PO TABS: 25.0000 mg | ORAL_TABLET | Freq: Four times a day (QID) | ORAL | Status: DC

## 2014-03-07 MED ORDER — HYDROXYZINE HCL 25 MG PO TABS
25.0000 mg | ORAL_TABLET | Freq: Once | ORAL | Status: AC
  Administered 2014-03-07: 25 mg via ORAL
  Filled 2014-03-07: qty 1

## 2014-03-07 NOTE — Discharge Instructions (Signed)
°  Atarax as prescribed as needed for itching.  Follow up with your doctor if not improving in the next few days.  Pruritus  Pruritus is an itch. There are many different problems that can cause an itch. Dry skin is one of the most common causes of itching. Most cases of itching do not require medical attention.  HOME CARE INSTRUCTIONS  Make sure your skin is moistened on a regular basis. A moisturizer that contains petroleum jelly is best for keeping moisture in your skin. If you develop a rash, you may try the following for relief:   Use corticosteroid cream.  Apply cool compresses to the affected areas.  Bathe with Epsom salts or baking soda in the bathwater.  Soak in colloidal oatmeal baths. These are available at your pharmacy.  Apply baking soda paste to the rash. Stir water into baking soda until it reaches a paste-like consistency.  Use an anti-itch lotion.  Take over-the-counter diphenhydramine medicine by mouth as the instructions direct.  Avoid scratching. Scratching may cause the rash to become infected. If itching is very bad, your caregiver may suggest prescription lotions or creams to lessen your symptoms.  Avoid hot showers, which can make itching worse. A cold shower may help with itching as long as you use a moisturizer after the shower. SEEK MEDICAL CARE IF: The itching does not go away after several days. Document Released: 11/22/2010 Document Revised: 07/27/2013 Document Reviewed: 11/22/2010 [facility] Patient Information 2015 [place], [place]. This information is not intended to replace advice given to you by your health care provider. Make sure you discuss any questions you have with your health care provider.

## 2014-03-07 NOTE — ED Provider Notes (Signed)
CSN: [id number]     Arrival date & time 03/06/14  2253 History  This chart was scribed for Veryl Speak, MD by Jeanell Sparrow, ED Scribe. This patient was seen in room APA05/APA05 and the patient's care was started at 12:37 AM.   Chief Complaint  Patient presents with  . Rash   Patient is a 48 y.o. female presenting with rash. The history is provided by the patient. No language interpreter was used.  Rash Location:  Full body Quality: blistering and itchiness   Severity:  Moderate Onset quality:  Gradual Duration:  4 weeks Timing:  Constant Progression:  Worsening Chronicity:  New Context: not sick contacts   Relieved by:  None tried Worsened by:  Nothing tried Ineffective treatments:  None tried Associated symptoms: fatigue and headaches   Associated symptoms: no fever and no sore throat     HPI Comments: Martha Espinoza is a 48 y.o. female who presents to the Emergency Department complaining of a rash that started 3-4 weeks ago. She reports that her abdomen started  Itching, so she went to her PCP. She reports that her PCP took off her current medications without relief. She states that she started to develop blisters throughout her entire body and feeling fatigue. She reports that she has a constant moderate headache that started today. She denies any sick contacts. She denies any fever or sore throat.   Past Medical History  Diagnosis Date  . Polycystic ovarian syndrome   . Colitis due to Clostridium difficile 2001  . Hepatic hemangioma   . Anxiety   . IBS (irritable bowel syndrome)   . Salmonella   . GERD (gastroesophageal reflux disease)   . CIS (carcinoma in situ)     rectum  . Bipolar 1 disorder   . Migraine headache   . Asthma   . Arthritis   . Blood transfusion without reported diagnosis   . Depression   . Hypertension   . Allergy   . COPD (chronic obstructive pulmonary disease)    Past Surgical History  Procedure Laterality Date  . Cholecystectomy    .  Colonoscopy  05/05/2007    adenoma  . Situ removed  age 74    High-grade rectal adenoma removed from rectum   . Dilation and curettage of uterus      for vaginal bleeding  . Tubal ligation     Family History  Problem Relation Age of Onset  . Colon cancer Father 52  . Cancer Father   . Diverticulitis Mother   . Heart disease Mother   . Hypertension Mother   . Hyperlipidemia Mother   . Mental illness Mother   . Diabetes Mother    History  Substance Use Topics  . Smoking status: Current Every Day Smoker -- 0.50 packs/day for 18 years    Types: Cigarettes  . Smokeless tobacco: Never Used  . Alcohol Use: No   OB History    No data available     Review of Systems  Constitutional: Positive for fatigue. Negative for fever.  HENT: Negative for sore throat.   Skin: Positive for rash.  Neurological: Positive for headaches.  All other systems reviewed and are negative.   Allergies  Shellfish allergy; Codeine; and Latex  Home Medications   Prior to Admission medications   Medication Sig Start Date End Date Taking? Authorizing Provider  albuterol (VENTOLIN HFA) 108 (90 BASE) MCG/ACT inhaler Inhale 2 puffs into the lungs every 6 (six) hours as needed for  wheezing or shortness of breath. 12/02/13   Tammi Sou, MD  aspirin 81 MG chewable tablet Chew 1 tablet (81 mg total) by mouth daily. 09/06/12   Sharyon Cable, MD  busPIRone (BUSPAR) 7.5 MG tablet Take 7.5 mg by mouth 3 (three) times daily.    Historical Provider, MD  clonazePAM (KLONOPIN) 0.5 MG tablet 1-2 tabs po bid as needed for anxiety 02/23/14   Tammi Sou, MD  hydrOXYzine (VISTARIL) 25 MG capsule Take 25 mg by mouth 2 (two) times daily.    Historical Provider, MD  lamoTRIgine (LAMICTAL) 200 MG tablet Take 200 mg by mouth daily.    Historical Provider, MD  rizatriptan (MAXALT) 10 MG tablet Take 1 tablet (10 mg total) by mouth as needed for migraine. May repeat in 2 hours if needed Patient not taking: Reported on  02/23/2014 01/15/14   Irene Pap, NP  traZODone (DESYREL) 100 MG tablet Take 100 mg by mouth at bedtime as needed for sleep.    Historical Provider, MD   BP 133/81 mmHg  Pulse 98  Temp(Src) 98.4 F (36.9 C) (Oral)  Resp 19  Ht 5' (1.524 m)  Wt 239 lb (108.41 kg)  BMI 46.68 kg/m2  SpO2 97%  LMP 02/03/2014 Physical Exam  Constitutional: She is oriented to person, place, and time. She appears well-developed and well-nourished. No distress.  HENT:  Head: Normocephalic and atraumatic.  Neck: Neck supple. No tracheal deviation present.  Cardiovascular: Normal rate.  Exam reveals no gallop.   No murmur heard. Pulmonary/Chest: Effort normal. No respiratory distress. She has no wheezes. She has no rales.  Musculoskeletal: Normal range of motion.  Neurological: She is alert and oriented to person, place, and time.  Skin: Skin is warm and dry. Rash noted.  Several small vesicular lesions to both hands and feet.   Dried skin throughout, however no obvious rash or lesions.   Psychiatric: She has a normal mood and affect. Her behavior is normal.  Nursing note and vitals reviewed.   ED Course  Procedures (including critical care time) DIAGNOSTIC STUDIES: Oxygen Saturation is 97% on RA, normal by my interpretation.    COORDINATION OF CARE: 12:41 AM- Pt advised of plan for treatment which includes medication and pt agrees.  Labs Review Labs Reviewed - No data to display  Imaging Review No results found.   EKG Interpretation None      MDM   Final diagnoses:  None    Patient presents here with complaints of rash that is been ongoing for one month. To my exam, I am unable to identify much of the significant rash, however she does appear to have small blisters on her hands and feet. This raises my suspicion for possibly hand, foot, and mouth disease, however does not fit the one-month long clinical picture. I will treat her pruritus with hydroxyzine and advise her to follow-up  with her primary Dr. to discuss her situation.  I personally performed the services described in this documentation, which was scribed in my presence. The recorded information has been reviewed and is accurate.       Veryl Speak, MD 03/07/14 [id number]

## 2014-03-12 ENCOUNTER — Ambulatory Visit (INDEPENDENT_AMBULATORY_CARE_PROVIDER_SITE_OTHER): Payer: 59 | Admitting: Family Medicine

## 2014-03-12 ENCOUNTER — Encounter: Payer: Self-pay | Admitting: Family Medicine

## 2014-03-12 VITALS — BP 121/87 | HR 102 | Temp 98.2°F | Resp 18 | Ht 62.0 in | Wt 237.0 lb

## 2014-03-12 DIAGNOSIS — R110 Nausea: Secondary | ICD-10-CM

## 2014-03-12 DIAGNOSIS — R5383 Other fatigue: Secondary | ICD-10-CM

## 2014-03-12 DIAGNOSIS — R5381 Other malaise: Secondary | ICD-10-CM

## 2014-03-12 DIAGNOSIS — L282 Other prurigo: Secondary | ICD-10-CM

## 2014-03-12 LAB — CBC WITH DIFFERENTIAL/PLATELET
Basophils Absolute: 0 10*3/uL (ref 0.0–0.1)
Basophils Relative: 0.4 % (ref 0.0–3.0)
Eosinophils Absolute: 0.1 10*3/uL (ref 0.0–0.7)
Eosinophils Relative: 0.8 % (ref 0.0–5.0)
HCT: 44.9 % (ref 36.0–46.0)
Hemoglobin: 14.6 g/dL (ref 12.0–15.0)
Lymphocytes Relative: 30.8 % (ref 12.0–46.0)
Lymphs Abs: 3.5 10*3/uL (ref 0.7–4.0)
MCHC: 32.4 g/dL (ref 30.0–36.0)
MCV: 85.2 fl (ref 78.0–100.0)
Monocytes Absolute: 0.5 10*3/uL (ref 0.1–1.0)
Monocytes Relative: 4 % (ref 3.0–12.0)
Neutro Abs: 7.3 10*3/uL (ref 1.4–7.7)
Neutrophils Relative %: 64 % (ref 43.0–77.0)
Platelets: 383 10*3/uL (ref 150.0–400.0)
RBC: 5.27 Mil/uL — ABNORMAL HIGH (ref 3.87–5.11)
RDW: 14.9 % (ref 11.5–15.5)
WBC: 11.4 10*3/uL — ABNORMAL HIGH (ref 4.0–10.5)

## 2014-03-12 LAB — C-REACTIVE PROTEIN: CRP: 3.7 mg/dL (ref 0.5–20.0)

## 2014-03-12 LAB — COMPREHENSIVE METABOLIC PANEL
ALT: 14 U/L (ref 0–35)
AST: 13 U/L (ref 0–37)
Albumin: 4.2 g/dL (ref 3.5–5.2)
Alkaline Phosphatase: 62 U/L (ref 39–117)
BUN: 11 mg/dL (ref 6–23)
CO2: 22 mEq/L (ref 19–32)
Calcium: 9.4 mg/dL (ref 8.4–10.5)
Chloride: 103 mEq/L (ref 96–112)
Creatinine, Ser: 0.7 mg/dL (ref 0.4–1.2)
GFR: 100.62 mL/min (ref >60.00)
Glucose, Bld: 103 mg/dL — ABNORMAL HIGH (ref 70–99)
Potassium: 3.8 mEq/L (ref 3.5–5.1)
Sodium: 135 mEq/L (ref 135–145)
Total Bilirubin: 0.6 mg/dL (ref 0.2–1.2)
Total Protein: 7 g/dL (ref 6.0–8.3)

## 2014-03-12 LAB — SEDIMENTATION RATE: Sed Rate: 25 mm/hr — ABNORMAL HIGH (ref 0–22)

## 2014-03-12 MED ORDER — PREDNISONE 20 MG PO TABS
ORAL_TABLET | ORAL | Status: DC
Start: 2014-03-12 — End: 2014-03-25

## 2014-03-12 NOTE — Progress Notes (Signed)
Pre visit review using our clinic review tool, if applicable. No additional management support is needed unless otherwise documented below in the visit note. 

## 2014-03-12 NOTE — Progress Notes (Signed)
Quick Note:  Please notify patient that all her labs came back normal. ______

## 2014-03-12 NOTE — Progress Notes (Addendum)
OFFICE NOTE  03/12/2014  CC:  Chief Complaint  Patient presents with  . Rash    recent ER visit.      HPI: Patient is a 48 y.o. Caucasian female who is here with her mother for ongoing problem (since Nov 1)  with itching w/out much history of identifiable rash.  I saw her 02/23/14 and felt anxiety was a main contributor and started her on clonazepam. She went to the ED 03/07/14 and some blisters on her hands and feet were noted but no other rash.  This did not fit the usual picture of hand, foot, and mouth disease, though.  She was treated with hydroxyzine and told to f/u with me.  Today she tells me she feels like the rash feels worse/constant and she is going crazy due to this.  She has some little blisters that she confirms started the day she went to the ED 03/07/14.  That day, and since then, she has felt nausea, generalized weakness.  No vomiting.  No muscle or joint aches.  ++Generalized malaise.  Since ED visit only a few more of these tiny blisters have appeared on her hands.  Areas of itching involved are everywhere except her chest, neck, face, and head.  Husband and child in her home have no lesions.  She was taken off her lamictal by her psychiatrist approx 2 wks ago due to this rash, put on oxcarbazapine instead.  Pertinent PMH:  Past medical, surgical, social, and family history reviewed and no changes are noted since last office visit.  MEDS: not on lamictal listed below Outpatient Prescriptions Prior to Visit  Medication Sig Dispense Refill  . albuterol (VENTOLIN HFA) 108 (90 BASE) MCG/ACT inhaler Inhale 2 puffs into the lungs every 6 (six) hours as needed for wheezing or shortness of breath. 1 Inhaler 1  . aspirin 81 MG chewable tablet Chew 1 tablet (81 mg total) by mouth daily. 14 tablet 0  . busPIRone (BUSPAR) 7.5 MG tablet Take 7.5 mg by mouth 3 (three) times daily.    . clonazePAM (KLONOPIN) 0.5 MG tablet 1-2 tabs po bid as needed for anxiety 60 tablet 1  .  hydrOXYzine (ATARAX/VISTARIL) 25 MG tablet Take 1 tablet (25 mg total) by mouth every 6 (six) hours. 20 tablet 0  . rizatriptan (MAXALT) 10 MG tablet Take 1 tablet (10 mg total) by mouth as needed for migraine. May repeat in 2 hours if needed 10 tablet 0  . traZODone (DESYREL) 100 MG tablet Take 100 mg by mouth at bedtime as needed for sleep.    . hydrOXYzine (VISTARIL) 25 MG capsule Take 25 mg by mouth 2 (two) times daily.    Marland Kitchen lamoTRIgine (LAMICTAL) 200 MG tablet Take 200 mg by mouth daily.     No facility-administered medications prior to visit.   PE: Blood pressure 121/87, pulse 102, temperature 98.2 F (36.8 C), temperature source Temporal, resp. rate 18, height '5\' 2"'  (1.575 m), weight 237 lb (107.502 kg), last menstrual period 02/03/2014, SpO2 94 %. Pt examined with her mother present in the room. Gen: Alert, well appearing.  Patient is oriented to person, place, time, and situation. ENT: Ears: EACs clear, normal epithelium.  TMs with good light reflex and landmarks bilaterally.  Eyes: no injection, icteris, swelling, or exudate.  EOMI, PERRLA. Nose: no drainage or turbinate edema/swelling.  No injection or focal lesion.  Mouth: lips without lesion/swelling.  Oral mucosa pink and moist.  Dentition intact and without obvious caries or gingival swelling.  Oropharynx without erythema, exudate, or swelling.  CV: RRR, no m/r/g.   LUNGS: CTA bilat, nonlabored resps, good aeration in all lung fields. EXT: no clubbing, cyanosis, or edema.  SKIN: no jaundice.  Overall, the only visible rash is tiny, flesh colored "goosebump" type lesions scattered all over lower abd, lower back, buttocks, arms.  Her hands have a few tiny pustulovesicular type lesions w/out erythema, and her right foot has one similar lesion.  Left foot is clear.  Her skin has several areas of excoriations with small scabs.  Overall, I would not look at her skin, though, and say that she had a distinct rash.  IMPRESSION AND  PLAN:  Pruritic rash, unclear etiology. Does not seem to have abated since d/c of lamictal.   No response to treatment of anxiety. Max dose oral antihistamines are not helping. I'll check CBC, CMET, ESR, ANA, CRP. I recommended we try empiric treatment for scabies and if this did not show signs of helping in 2 days then we would start systemic steroids.  However, pt not willing to do this, wanted to try systemic steroids now.   I rx'd prednisone 21m qd x 3d, 423mqd x 3d, then 2045md x 3d, then 71m76m x 4d.   F/u in 5d and if not improved any then will do punch biopsy and dermatology referral.  An After Visit Summary was printed and given to the patient.  FOLLOW UP: 5d

## 2014-03-15 LAB — ANA: Anti Nuclear Antibody(ANA): NEGATIVE

## 2014-03-22 ENCOUNTER — Telehealth: Payer: Self-pay | Admitting: Nurse Practitioner

## 2014-03-22 NOTE — Telephone Encounter (Signed)
Please advise? Patient saw McGowen 03/12/2014.

## 2014-03-22 NOTE — Telephone Encounter (Signed)
Take 10 mg claritin & 20 mg pepcid & schedule OV w/Dr McGowen for re-eval.

## 2014-03-22 NOTE — Telephone Encounter (Signed)
Patient notified. Patient stated that she will call back tomorrow.

## 2014-03-22 NOTE — Telephone Encounter (Signed)
Stopped her Steriods three days ago and am back to itching and rash is reappearing.  She is very uncomfortable and would like to know what we can do to help her.

## 2014-03-23 ENCOUNTER — Observation Stay (HOSPITAL_COMMUNITY)
Admission: EM | Admit: 2014-03-23 | Discharge: 2014-03-25 | Disposition: A | Discharge status: Home / Self Care | Payer: PRIVATE HEALTH INSURANCE | Attending: Internal Medicine | Admitting: Internal Medicine

## 2014-03-23 ENCOUNTER — Emergency Department (HOSPITAL_COMMUNITY): Payer: PRIVATE HEALTH INSURANCE

## 2014-03-23 ENCOUNTER — Encounter (HOSPITAL_COMMUNITY): Payer: Self-pay | Admitting: Emergency Medicine

## 2014-03-23 DIAGNOSIS — Z85048 Personal history of other malignant neoplasm of rectum, rectosigmoid junction, and anus: Secondary | ICD-10-CM | POA: Insufficient documentation

## 2014-03-23 DIAGNOSIS — R739 Hyperglycemia, unspecified: Secondary | ICD-10-CM | POA: Diagnosis present

## 2014-03-23 DIAGNOSIS — R0789 Other chest pain: Secondary | ICD-10-CM

## 2014-03-23 DIAGNOSIS — Z79899 Other long term (current) drug therapy: Secondary | ICD-10-CM | POA: Diagnosis not present

## 2014-03-23 DIAGNOSIS — I10 Essential (primary) hypertension: Secondary | ICD-10-CM | POA: Diagnosis not present

## 2014-03-23 DIAGNOSIS — Z91040 Latex allergy status: Secondary | ICD-10-CM | POA: Insufficient documentation

## 2014-03-23 DIAGNOSIS — K219 Gastro-esophageal reflux disease without esophagitis: Secondary | ICD-10-CM | POA: Diagnosis present

## 2014-03-23 DIAGNOSIS — G43909 Migraine, unspecified, not intractable, without status migrainosus: Secondary | ICD-10-CM | POA: Diagnosis not present

## 2014-03-23 DIAGNOSIS — F319 Bipolar disorder, unspecified: Secondary | ICD-10-CM | POA: Diagnosis present

## 2014-03-23 DIAGNOSIS — L282 Other prurigo: Secondary | ICD-10-CM | POA: Diagnosis not present

## 2014-03-23 DIAGNOSIS — M1990 Unspecified osteoarthritis, unspecified site: Secondary | ICD-10-CM | POA: Insufficient documentation

## 2014-03-23 DIAGNOSIS — J449 Chronic obstructive pulmonary disease, unspecified: Secondary | ICD-10-CM | POA: Diagnosis not present

## 2014-03-23 DIAGNOSIS — Z86018 Personal history of other benign neoplasm: Secondary | ICD-10-CM | POA: Insufficient documentation

## 2014-03-23 DIAGNOSIS — K589 Irritable bowel syndrome without diarrhea: Secondary | ICD-10-CM | POA: Insufficient documentation

## 2014-03-23 DIAGNOSIS — Z8619 Personal history of other infectious and parasitic diseases: Secondary | ICD-10-CM | POA: Insufficient documentation

## 2014-03-23 DIAGNOSIS — R079 Chest pain, unspecified: Principal | ICD-10-CM | POA: Diagnosis present

## 2014-03-23 DIAGNOSIS — D72829 Elevated white blood cell count, unspecified: Secondary | ICD-10-CM | POA: Diagnosis present

## 2014-03-23 DIAGNOSIS — Z720 Tobacco use: Secondary | ICD-10-CM | POA: Diagnosis present

## 2014-03-23 DIAGNOSIS — E282 Polycystic ovarian syndrome: Secondary | ICD-10-CM | POA: Diagnosis not present

## 2014-03-23 DIAGNOSIS — Z7982 Long term (current) use of aspirin: Secondary | ICD-10-CM | POA: Diagnosis not present

## 2014-03-23 DIAGNOSIS — F419 Anxiety disorder, unspecified: Secondary | ICD-10-CM | POA: Diagnosis not present

## 2014-03-23 DIAGNOSIS — E669 Obesity, unspecified: Secondary | ICD-10-CM | POA: Diagnosis present

## 2014-03-23 DIAGNOSIS — E785 Hyperlipidemia, unspecified: Secondary | ICD-10-CM | POA: Diagnosis present

## 2014-03-23 DIAGNOSIS — R072 Precordial pain: Secondary | ICD-10-CM | POA: Diagnosis not present

## 2014-03-23 DIAGNOSIS — R9439 Abnormal result of other cardiovascular function study: Secondary | ICD-10-CM

## 2014-03-23 HISTORY — DX: Hyperlipidemia, unspecified: E78.5

## 2014-03-23 HISTORY — DX: Morbid (severe) obesity due to excess calories: E66.01

## 2014-03-23 HISTORY — DX: Malignant neoplasm of rectum: C20

## 2014-03-23 LAB — HEMOGLOBIN A1C
Hgb A1c MFr Bld: 5.9 % — ABNORMAL HIGH (ref <5.7)
Mean Plasma Glucose: 123 mg/dL — ABNORMAL HIGH (ref <117)

## 2014-03-23 LAB — TROPONIN I
Troponin I: <0.03 ng/mL (ref <0.031)
Troponin I: <0.03 ng/mL (ref <0.031)
Troponin I: <0.03 ng/mL (ref <0.031)
Troponin I: <0.03 ng/mL (ref <0.031)

## 2014-03-23 LAB — HEPATIC FUNCTION PANEL
ALT: 21 U/L (ref 0–35)
AST: 18 U/L (ref 0–37)
Albumin: 4.2 g/dL (ref 3.5–5.2)
Alkaline Phosphatase: 59 U/L (ref 39–117)
Bilirubin, Direct: <0.1 mg/dL (ref 0.0–0.3)
Total Bilirubin: 0.3 mg/dL (ref 0.3–1.2)
Total Protein: 7 g/dL (ref 6.0–8.3)

## 2014-03-23 LAB — DIFFERENTIAL
Basophils Absolute: 0 10*3/uL (ref 0.0–0.1)
Basophils Relative: 0 % (ref 0–1)
Eosinophils Absolute: 0 10*3/uL (ref 0.0–0.7)
Eosinophils Relative: 0 % (ref 0–5)
Lymphocytes Relative: 14 % (ref 12–46)
Lymphs Abs: 2.1 10*3/uL (ref 0.7–4.0)
Monocytes Absolute: 0.6 10*3/uL (ref 0.1–1.0)
Monocytes Relative: 4 % (ref 3–12)
Neutro Abs: 12.2 10*3/uL — ABNORMAL HIGH (ref 1.7–7.7)
Neutrophils Relative %: 82 % — ABNORMAL HIGH (ref 43–77)

## 2014-03-23 LAB — URINALYSIS, ROUTINE W REFLEX MICROSCOPIC
Bilirubin Urine: NEGATIVE
Glucose, UA: NEGATIVE mg/dL
Ketones, ur: NEGATIVE mg/dL
Leukocytes, UA: NEGATIVE
Nitrite: NEGATIVE
Protein, ur: NEGATIVE mg/dL
Specific Gravity, Urine: >1.03 — ABNORMAL HIGH (ref 1.005–1.030)
Urobilinogen, UA: 0.2 mg/dL (ref 0.0–1.0)
pH: 5.5 (ref 5.0–8.0)

## 2014-03-23 LAB — BASIC METABOLIC PANEL
Anion gap: 8 (ref 5–15)
BUN: 13 mg/dL (ref 6–23)
CO2: 23 mmol/L (ref 19–32)
Calcium: 9.2 mg/dL (ref 8.4–10.5)
Chloride: 108 mEq/L (ref 96–112)
Creatinine, Ser: 0.69 mg/dL (ref 0.50–1.10)
GFR calc Af Amer: >90 mL/min (ref >90)
GFR calc non Af Amer: >90 mL/min (ref >90)
Glucose, Bld: 147 mg/dL — ABNORMAL HIGH (ref 70–99)
Potassium: 3.9 mmol/L (ref 3.5–5.1)
Sodium: 139 mmol/L (ref 135–145)

## 2014-03-23 LAB — CBC
HCT: 44 % (ref 36.0–46.0)
Hemoglobin: 14.5 g/dL (ref 12.0–15.0)
MCH: 28.4 pg (ref 26.0–34.0)
MCHC: 33 g/dL (ref 30.0–36.0)
MCV: 86.3 fL (ref 78.0–100.0)
Platelets: 348 10*3/uL (ref 150–400)
RBC: 5.1 MIL/uL (ref 3.87–5.11)
RDW: 15.2 % (ref 11.5–15.5)
WBC: 15.1 10*3/uL — ABNORMAL HIGH (ref 4.0–10.5)

## 2014-03-23 LAB — URINE MICROSCOPIC-ADD ON

## 2014-03-23 LAB — TSH: TSH: 0.462 u[IU]/mL (ref 0.350–4.500)

## 2014-03-23 MED ORDER — ENOXAPARIN SODIUM 40 MG/0.4ML ~~LOC~~ SOLN
40.0000 mg | SUBCUTANEOUS | Status: DC
  Administered 2014-03-23 – 2014-03-25 (×3): 40 mg via SUBCUTANEOUS
  Filled 2014-03-23 (×3): qty 0.4

## 2014-03-23 MED ORDER — NITROGLYCERIN 0.4 MG SL SUBL: 0.4000 mg | SUBLINGUAL_TABLET | SUBLINGUAL | Status: DC | PRN

## 2014-03-23 MED ORDER — ONDANSETRON HCL 4 MG PO TABS: 4.0000 mg | ORAL_TABLET | Freq: Four times a day (QID) | ORAL | Status: DC | PRN

## 2014-03-23 MED ORDER — NITROGLYCERIN 0.4 MG SL SUBL
0.4000 mg | SUBLINGUAL_TABLET | SUBLINGUAL | Status: AC | PRN
  Administered 2014-03-23 (×3): 0.4 mg via SUBLINGUAL
  Filled 2014-03-23 (×3): qty 1

## 2014-03-23 MED ORDER — PREDNISONE 10 MG PO TABS
10.0000 mg | ORAL_TABLET | Freq: Two times a day (BID) | ORAL | Status: DC
  Administered 2014-03-23 – 2014-03-25 (×6): 10 mg via ORAL
  Filled 2014-03-23 (×6): qty 1

## 2014-03-23 MED ORDER — CLONAZEPAM 0.5 MG PO TABS
0.5000 mg | ORAL_TABLET | Freq: Two times a day (BID) | ORAL | Status: DC
  Administered 2014-03-23 – 2014-03-25 (×5): 0.5 mg via ORAL
  Filled 2014-03-23 (×5): qty 1

## 2014-03-23 MED ORDER — POTASSIUM CHLORIDE IN NACL 20-0.9 MEQ/L-% IV SOLN
INTRAVENOUS | Status: DC
  Administered 2014-03-23 – 2014-03-24 (×2): via INTRAVENOUS

## 2014-03-23 MED ORDER — MORPHINE SULFATE 4 MG/ML IJ SOLN
4.0000 mg | Freq: Once | INTRAMUSCULAR | Status: AC
  Administered 2014-03-23: 4 mg via INTRAVENOUS
  Filled 2014-03-23: qty 1

## 2014-03-23 MED ORDER — PANTOPRAZOLE SODIUM 40 MG PO TBEC
40.0000 mg | DELAYED_RELEASE_TABLET | Freq: Every day | ORAL | Status: DC
  Administered 2014-03-23 – 2014-03-25 (×3): 40 mg via ORAL
  Filled 2014-03-23 (×3): qty 1

## 2014-03-23 MED ORDER — ACETAMINOPHEN 650 MG RE SUPP: 650.0000 mg | Freq: Four times a day (QID) | RECTAL | Status: DC | PRN

## 2014-03-23 MED ORDER — ONDANSETRON HCL 4 MG/2ML IJ SOLN
4.0000 mg | Freq: Once | INTRAMUSCULAR | Status: AC
  Administered 2014-03-23: 4 mg via INTRAVENOUS
  Filled 2014-03-23: qty 2

## 2014-03-23 MED ORDER — GUAIFENESIN-DM 100-10 MG/5ML PO SYRP: 5.0000 mL | ORAL_SOLUTION | ORAL | Status: DC | PRN

## 2014-03-23 MED ORDER — ALBUTEROL SULFATE HFA 108 (90 BASE) MCG/ACT IN AERS
2.0000 | INHALATION_SPRAY | Freq: Four times a day (QID) | RESPIRATORY_TRACT | Status: DC | PRN
  Filled 2014-03-23: qty 6.7

## 2014-03-23 MED ORDER — HYDROMORPHONE HCL 1 MG/ML IJ SOLN
0.5000 mg | INTRAMUSCULAR | Status: DC | PRN
  Administered 2014-03-23: 0.5 mg via INTRAVENOUS
  Filled 2014-03-23: qty 1

## 2014-03-23 MED ORDER — BUSPIRONE HCL 5 MG PO TABS
10.0000 mg | ORAL_TABLET | Freq: Three times a day (TID) | ORAL | Status: DC
  Administered 2014-03-23 – 2014-03-25 (×7): 10 mg via ORAL
  Filled 2014-03-23: qty 2
  Filled 2014-03-23: qty 1
  Filled 2014-03-23: qty 2
  Filled 2014-03-23: qty 1
  Filled 2014-03-23 (×3): qty 2
  Filled 2014-03-23: qty 1
  Filled 2014-03-23: qty 2

## 2014-03-23 MED ORDER — ASPIRIN 81 MG PO CHEW
81.0000 mg | CHEWABLE_TABLET | Freq: Every day | ORAL | Status: DC
  Administered 2014-03-23 – 2014-03-25 (×3): 81 mg via ORAL
  Filled 2014-03-23 (×3): qty 1

## 2014-03-23 MED ORDER — ALUM & MAG HYDROXIDE-SIMETH 200-200-20 MG/5ML PO SUSP: 30.0000 mL | Freq: Four times a day (QID) | ORAL | Status: DC | PRN

## 2014-03-23 MED ORDER — ONDANSETRON HCL 4 MG/2ML IJ SOLN: 4.0000 mg | Freq: Four times a day (QID) | INTRAMUSCULAR | Status: DC | PRN

## 2014-03-23 MED ORDER — OXCARBAZEPINE 300 MG PO TABS
300.0000 mg | ORAL_TABLET | Freq: Two times a day (BID) | ORAL | Status: DC
  Administered 2014-03-23 – 2014-03-25 (×4): 300 mg via ORAL
  Filled 2014-03-23 (×13): qty 1

## 2014-03-23 MED ORDER — NICOTINE 14 MG/24HR TD PT24
14.0000 mg | MEDICATED_PATCH | Freq: Every day | TRANSDERMAL | Status: DC
  Administered 2014-03-23 – 2014-03-25 (×3): 14 mg via TRANSDERMAL
  Filled 2014-03-23 (×3): qty 1

## 2014-03-23 MED ORDER — ASPIRIN 81 MG PO CHEW
324.0000 mg | CHEWABLE_TABLET | Freq: Once | ORAL | Status: AC
  Administered 2014-03-23: 324 mg via ORAL
  Filled 2014-03-23: qty 4

## 2014-03-23 MED ORDER — CLONAZEPAM 0.5 MG PO TABS
0.2500 mg | ORAL_TABLET | Freq: Three times a day (TID) | ORAL | Status: DC | PRN
  Administered 2014-03-24: 0.25 mg via ORAL
  Filled 2014-03-23: qty 1

## 2014-03-23 MED ORDER — DIPHENHYDRAMINE HCL 25 MG PO CAPS
25.0000 mg | ORAL_CAPSULE | Freq: Three times a day (TID) | ORAL | Status: DC
  Administered 2014-03-23 – 2014-03-25 (×7): 25 mg via ORAL
  Filled 2014-03-23 (×7): qty 1

## 2014-03-23 MED ORDER — SODIUM CHLORIDE 0.9 % IV BOLUS (SEPSIS)
1000.0000 mL | Freq: Once | INTRAVENOUS | Status: AC
  Administered 2014-03-23: 1000 mL via INTRAVENOUS

## 2014-03-23 MED ORDER — ALBUTEROL SULFATE (2.5 MG/3ML) 0.083% IN NEBU: 2.5000 mg | INHALATION_SOLUTION | Freq: Four times a day (QID) | RESPIRATORY_TRACT | Status: DC | PRN

## 2014-03-23 MED ORDER — HYDROCORTISONE 1 % EX CREA
TOPICAL_CREAM | Freq: Three times a day (TID) | CUTANEOUS | Status: DC
  Administered 2014-03-23 – 2014-03-25 (×7): 1 via TOPICAL
  Filled 2014-03-23 (×7): qty 1.5

## 2014-03-23 MED ORDER — ACETAMINOPHEN 325 MG PO TABS
650.0000 mg | ORAL_TABLET | Freq: Four times a day (QID) | ORAL | Status: DC | PRN
  Administered 2014-03-23 – 2014-03-24 (×3): 650 mg via ORAL
  Filled 2014-03-23 (×3): qty 2

## 2014-03-23 NOTE — ED Notes (Signed)
Pt states she had episode of chest pain last night and took Asprin and nerve medication. Continued on during the night so she called her mother to bring to the ED

## 2014-03-23 NOTE — ED Provider Notes (Signed)
This chart was scribed for [name], DO by [facility], ED Scribe. The patient was seen in APA14/APA14 and the patient's care was started at 8:07 AM.  TIME SEEN: 8:07 AM  CHIEF COMPLAINT: Chest pain  HPI:  Martha Espinoza is a 48 y.o. female with a history of anxiety, bipolar disorder, hypertension, tobacco use, family history of a mother with CAD in her 57s who presents to the Emergency Department complaining of intermittent CP started last night.. CP has been constant for the past 3-4 hours. She has chest tightness, nausea, light headedness, diaphoresis and feeling like she cannot get her breath.  Denies any aggravating or relieving factors. Pain is not worse with food or deep inspiration. She has not exerted herself today. Denies any history of stress test or cardiac catheterization.  No history of PE or DVT, lower extremity swelling or pain, recent prolonged immobilization such as long flight or hospitalization, fracture, surgery, trauma. She is not on exogenous estrogens. Denies fever but has had dry cough.  ROS: See HPI Constitutional: no fever  Eyes: no drainage  ENT: no runny nose   Cardiovascular: Chest pain Resp: no SOB  GI: no vomiting GU: no dysuria Integumentary: no rash  Allergy: no hives  Musculoskeletal: no leg swelling  Neurological: no slurred speech ROS otherwise negative  PAST MEDICAL HISTORY/PAST SURGICAL HISTORY:  Past Medical History  Diagnosis Date  . Polycystic ovarian syndrome   . Colitis due to Clostridium difficile 2001  . Hepatic hemangioma   . Anxiety   . IBS (irritable bowel syndrome)   . Salmonella   . GERD (gastroesophageal reflux disease)   . CIS (carcinoma in situ)     rectum  . Bipolar 1 disorder   . Migraine headache   . Asthma   . Arthritis   . Blood transfusion without reported diagnosis   . Depression   . Hypertension   . Allergy   . COPD (chronic obstructive pulmonary disease)     MEDICATIONS:  Prior to Admission  medications   Medication Sig Start Date End Date Taking? Authorizing Provider  albuterol (VENTOLIN HFA) 108 (90 BASE) MCG/ACT inhaler Inhale 2 puffs into the lungs every 6 (six) hours as needed for wheezing or shortness of breath. 12/02/13   Tammi Sou, MD  aspirin 81 MG chewable tablet Chew 1 tablet (81 mg total) by mouth daily. 09/06/12   Sharyon Cable, MD  busPIRone (BUSPAR) 7.5 MG tablet Take 7.5 mg by mouth 3 (three) times daily.    Historical Provider, MD  clonazePAM (KLONOPIN) 0.5 MG tablet 1-2 tabs po bid as needed for anxiety 02/23/14   Tammi Sou, MD  hydrOXYzine (ATARAX/VISTARIL) 25 MG tablet Take 1 tablet (25 mg total) by mouth every 6 (six) hours. 03/07/14   Veryl Speak, MD  hydrOXYzine (VISTARIL) 25 MG capsule Take 25 mg by mouth 2 (two) times daily.    Historical Provider, MD  lamoTRIgine (LAMICTAL) 200 MG tablet Take 200 mg by mouth daily.    Historical Provider, MD  OXcarbazepine (TRILEPTAL) 150 MG tablet Take 150 mg by mouth daily.    Historical Provider, MD  predniSONE (DELTASONE) 20 MG tablet 3 tabs po qd x 3d, then 2 tabs po qd x 3d, then 1 tab po qd x 3d, then 1/2 tab po qd x 4d 03/12/14   Tammi Sou, MD  rizatriptan (MAXALT) 10 MG tablet Take 1 tablet (10 mg total) by mouth as needed for migraine. May repeat  in 2 hours if needed 01/15/14   Irene Pap, NP  traZODone (DESYREL) 100 MG tablet Take 100 mg by mouth at bedtime as needed for sleep.    Historical Provider, MD    ALLERGIES:  Allergies  Allergen Reactions  . Shellfish Allergy Anaphylaxis  . Codeine Nausea And Vomiting    Hallucinations, Also sees people that are not there   . Latex Hives    SOCIAL HISTORY:  History  Substance Use Topics  . Smoking status: Current Every Day Smoker -- 0.50 packs/day for 18 years    Types: Cigarettes  . Smokeless tobacco: Never Used  . Alcohol Use: No    FAMILY HISTORY: Family History  Problem Relation Age of Onset  . Colon cancer Father 53  .  Cancer Father   . Diverticulitis Mother   . Heart disease Mother   . Hypertension Mother   . Hyperlipidemia Mother   . Mental illness Mother   . Diabetes Mother     EXAM: BP 175/88 mmHg  Pulse 113  Temp(Src) 97.7 F (36.5 C) (Oral)  Resp 22  Ht 5\' 7"  (1.702 m)  Wt 237 lb (107.502 kg)  BMI 37.11 kg/m2  SpO2 98%  LMP 03/20/2014 CONSTITUTIONAL: Alert and oriented and responds appropriately to questions. Well-appearing; well-nourished HEAD: Normocephalic EYES: Conjunctivae clear, PERRL ENT: normal nose; no rhinorrhea; moist mucous membranes; pharynx without lesions noted NECK: Supple, no meningismus, no LAD  CARD: RRR; S1 and S2 appreciated; no murmurs, no clicks, no rubs, no gallops, anterior chest wall is mildly TTP but this does not reproduce pt's CP. RESP: Normal chest excursion without splinting or tachypnea; breath sounds clear and equal bilaterally; no wheezes, no rhonchi, no rales, no hypoxia or respiratory distress ABD/GI: Normal bowel sounds; non-distended; soft, non-tender, no rebound, no guarding BACK:  The back appears normal and is non-tender to palpation, there is no CVA tenderness EXT: Normal ROM in all joints; non-tender to palpation; no edema; normal capillary refill; no cyanosis, no calf tenderness or swelling.    SKIN: Normal color for age and race; warm NEURO: Moves all extremities equally PSYCH: The patient's mood and manner are appropriate. Grooming and personal hygiene are appropriate.  MEDICAL DECISION MAKING: Patient here with chest pain concerning for cardiac chest pain. She does have multiple risk factors including hypertension for which she longer takes medication, family history and history of tobacco use. No history of provocative testing. EKG shows sinus tachycardia and repolarization abnormality but no other ischemic changes. We'll obtain cardiac labs, chest x-ray. We'll give aspirin nitroglycerin.  ED PROGRESS: She reports that her pain markedly  improved and is almost completely gone with nitroglycerin but she is now complaining of headache. We'll give morphine. Cardiac labs are unremarkable including negative troponin. Chest x-ray clear. Discussed with patient I recommend admission for observation for ACS rule out. She agrees with plan. PCP is Dr. Anitra Lauth.  Will consult hospitalist.   Scans to Dr. Caryn Section with hospitalist service for admission to telemetry, observation. Patient does have a leukocytosis but was recently on a prednisone taper. Urine shows no sign of infection other than moderate hemoglobin and few bacteria but also many squamous cells. Suspect dirty catch. Chest x-ray is clear. No fever.    EKG Interpretation  Date/Time:  Tuesday March 23 2014 07:55:07 EST Ventricular Rate:  102 PR Interval:  146 QRS Duration: 83 QT Interval:  342 QTC Calculation: 445 R Axis:   56 Text Interpretation:  Sinus tachycardia Probable left atrial enlargement  Borderline repolarization abnormality Confirmed by Akeema Broder,  DO, Raymund Manrique [phone]) on 03/23/2014 8:02:41 AM      I personally performed the services described in this documentation, which was scribed in my presence. The recorded information has been reviewed and is accurate.    [place], DO 03/23/14 1008

## 2014-03-23 NOTE — H&P (Signed)
[facility] History and Physical  Martha Espinoza SWF:[id number] DOB: 1965/06/11 DOA: 03/23/2014  Referring physician: ED physician, Dr. Leonides Schanz PCP: Tammi Sou, MD   Chief Complaint: Chest pain  HPI: Martha Espinoza is a 48 y.o. female with a history of bipolar disorder, chronic anxiety, and COPD, who presents to the emergency department with a chief complaint of chest pain. Her pain started 2 days ago and then resolve spontaneously. It occurred again early this morning, after midnight while she was attempting to go to sleep. It is located to the left of the sternum. It sometimes radiates to the right. She describes it as a "toothache" and "pressure". At its worse, it is a 10 over 10 in intensity. She took a baby aspirin and clonazepam which eased the pain a little. The pain is associated with shortness of breath, mild sweatiness, and lightheadedness. She denies pleurisy, nausea/vomiting, swelling/pain in her legs, or recent heavy lifting. She denies any recent travel or immobilization. She has occasional heartburn, but not on a consistent basis. She has had an increase in stress/anxiety because her son was recently in a near fatal motor vehicle accident. She also complains about a pruritic rash that she has had on and off for a couple of months. She was recently treated with a prednisone taper by her PCP. The rash and the itching subsided, but at the end of the prednisone taper, the itching and the rash have returned. She also had a "cold" with a productive cough 1 week ago, but it has subsided.  In the ED, she was given 3 sublingual nitroglycerin which took her pain intensity to almost 0. Her lab data are significant for a normal troponin I, glucose of 147, and W BC of 15.1. Her chest x-ray reveals no acute cardiopulmonary disease. Her EKG reveals sinus tachycardia with a heart rate of 102 bpm, but no concerning ST or T-wave changes. She is being admitted for further evaluation and  management.      Review of Systems:  As above in history present illness. In addition, it is positive for increase in anxiety, pruritic itching, difficulty sleeping, and recent URI symptoms.  Past Medical History  Diagnosis Date  . Polycystic ovarian syndrome   . Colitis due to Clostridium difficile 2001  . Hepatic hemangioma   . Anxiety   . IBS (irritable bowel syndrome)   . Salmonella   . GERD (gastroesophageal reflux disease)   . CIS (carcinoma in situ)     rectum  . Bipolar 1 disorder   . Migraine headache   . Asthma   . Arthritis   . Blood transfusion without reported diagnosis   . Depression   . Hypertension   . Allergy   . COPD (chronic obstructive pulmonary disease)    Past Surgical History  Procedure Laterality Date  . Cholecystectomy    . Colonoscopy  05/05/2007    adenoma  . Situ removed  age 60    High-grade rectal adenoma removed from rectum   . Dilation and curettage of uterus      for vaginal bleeding  . Tubal ligation     Social History: She is married. She has one son. She is unemployed. She smokes half a pack of cigarettes per day. She denies alcohol and illicit drug use.   Allergies  Allergen Reactions  . Shellfish Allergy Anaphylaxis  . Codeine Nausea And Vomiting    Hallucinations, Also sees people that are not there   . Latex Hives  Family History  Problem Relation Age of Onset  . Colon cancer Father 10  . Cancer Father   . Diverticulitis Mother   . Heart disease Mother   . Hypertension Mother   . Hyperlipidemia Mother   . Mental illness Mother   . Diabetes Mother      Prior to Admission medications   Medication Sig Start Date End Date Taking? Authorizing Provider  albuterol (VENTOLIN HFA) 108 (90 BASE) MCG/ACT inhaler Inhale 2 puffs into the lungs every 6 (six) hours as needed for wheezing or shortness of breath. 12/02/13  Yes Tammi Sou, MD  aspirin 81 MG chewable tablet Chew 1 tablet (81 mg total) by mouth daily. 09/06/12   Yes Sharyon Cable, MD  busPIRone (BUSPAR) 10 MG tablet Take 10 mg by mouth 3 (three) times daily.   Yes Historical Provider, MD  clonazePAM (KLONOPIN) 0.5 MG tablet 1-2 tabs po bid as needed for anxiety 02/23/14  Yes Tammi Sou, MD  diphenhydrAMINE (BENADRYL) 25 mg capsule Take 50 mg by mouth every 6 (six) hours as needed for itching or allergies.   Yes Historical Provider, MD  fexofenadine (ALLEGRA) 180 MG tablet Take 180 mg by mouth daily.   Yes Historical Provider, MD  hydrOXYzine (VISTARIL) 25 MG capsule Take 25 mg by mouth every 6 (six) hours as needed for anxiety.    Yes Historical Provider, MD  Oxcarbazepine (TRILEPTAL) 300 MG tablet Take 300 mg by mouth 2 (two) times daily.   Yes Historical Provider, MD  hydrOXYzine (ATARAX/VISTARIL) 25 MG tablet Take 1 tablet (25 mg total) by mouth every 6 (six) hours. Patient not taking: Reported on 03/23/2014 03/07/14   Veryl Speak, MD  predniSONE (DELTASONE) 20 MG tablet 3 tabs po qd x 3d, then 2 tabs po qd x 3d, then 1 tab po qd x 3d, then 1/2 tab po qd x 4d 03/12/14   Tammi Sou, MD  rizatriptan (MAXALT) 10 MG tablet Take 1 tablet (10 mg total) by mouth as needed for migraine. May repeat in 2 hours if needed Patient not taking: Reported on 03/23/2014 01/15/14   Irene Pap, NP   Physical Exam: Filed Vitals:   03/23/14 0845 03/23/14 0855 03/23/14 0900 03/23/14 1015  BP: 135/81 135/81 120/72 134/82  Pulse: 93 88 95 87  Temp:      TempSrc:      Resp: 21  23 21   Height:      Weight:      SpO2: 95% 95% 94% 96%    Wt Readings from Last 3 Encounters:  03/23/14 107.502 kg (237 lb)  03/12/14 107.502 kg (237 lb)  03/06/14 108.41 kg (239 lb)    General:  Appears calm and comfortable. Eyes: PERRL, normal lids, irises & conjunctiva ENT: grossly normal hearing; mildly dry mucous membranes Neck: no LAD, masses or thyromegaly Cardiovascular: S1, S2,, no m/r/g. No LE edema. Telemetry: SR, no arrhythmias  Respiratory: CTA  bilaterally, no w/r/r. Normal respiratory effort. Abdomen: Positive bowel sounds, soft, mildly obese, nontender, nondistended. Skin: Mild maculopapular rash on the volar surfaces of both arms. Musculoskeletal: grossly normal tone BUE/BLE; no calf tenderness; no acute hot red joints. Psychiatric: Flat affect. Speech is clear. No signs of acute anxiety or tremulousness. Neurologic: Cranial nerves II through XII are intact. She is alert and oriented 3. Strength is grossly 5 over 5 throughout. Sensation is grossly intact.           Labs on Admission:  Basic Metabolic Panel:  Recent Labs Lab 03/23/14 0803  NA 139  K 3.9  CL 108  CO2 23  GLUCOSE 147*  BUN 13  CREATININE 0.69  CALCIUM 9.2   Liver Function Tests: No results for input(s): AST, ALT, ALKPHOS, BILITOT, PROT, ALBUMIN in the last 168 hours. No results for input(s): LIPASE, AMYLASE in the last 168 hours. No results for input(s): AMMONIA in the last 168 hours. CBC:  Recent Labs Lab 03/23/14 0803  WBC 15.1*  NEUTROABS 12.2*  HGB 14.5  HCT 44.0  MCV 86.3  PLT 348   Cardiac Enzymes:  Recent Labs Lab 03/23/14 0803  TROPONINI <0.03    BNP (last 3 results) No results for input(s): PROBNP in the last 8760 hours. CBG: No results for input(s): GLUCAP in the last 168 hours.  Radiological Exams on Admission: Dg Chest 2 View  03/23/2014   CLINICAL DATA:  one-two days of productive cough and shortness of breath and chest pain; current tobacco use  EXAM: CHEST  2 VIEW  COMPARISON:  Chest x-ray of November 02, 2012.  FINDINGS: The lungs are adequately inflated and clear. The cardiac silhouette is top-normal in size. The pulmonary vascularity is not engorged. The mediastinum is normal in width. There is no pleural effusion. The bony thorax is unremarkable.  IMPRESSION: There is no active cardiopulmonary disease.   Electronically Signed   By: David  Martinique   On: 03/23/2014 08:43    EKG: Independently reviewed.    Assessment/Plan Principal Problem:   Chest pain of uncertain etiology Active Problems:   COPD (chronic obstructive pulmonary disease)   Hyperglycemia   Pruritic rash   Tobacco abuse   Obesity   Leukocytosis   Bipolar 1 disorder   Chronic anxiety   1. Chest pain. Her chest pain appears to be somewhat atypical per history, however, it resolved with 3 sublingual nitroglycerin tablets in the ED. The pain did not occur with activity, but at rest and in the environment of increased anxiety/stress at home and lack of sleep. She does have a family history of coronary artery disease in her mother who apparently had her first first MI in her late 6s. Her symptomatology is not consistent with a PE as she has no pleurisy, she is not hypoxic, and she has had no bilateral lower extremity pain or swelling. Her initial troponin I is negative. Her EKG appears unremarkable. She will be admitted for 24-hour observation. We'll continue sublingual nitroglycerin as needed and will add as needed hydromorphone for pain. Will continue aspirin. Will provide oxygen as needed. Will add Protonix empirically. For further evaluation, will cycle cardiac enzymes; will order fasting lipid profile, hepatic function panel, TSH, 2-D echocardiogram, and a follow-up EKG tomorrow morning. If the 2-D echocardiogram or any of the other studies appear worrisome, we'll consult cardiology. If her evaluation/workup is negative, would consider scheduling an outpatient cardiology follow-up. 2. COPD and tobacco abuse. Currently no signs of COPD exacerbation. Her chest x-ray is clear. Her exam is unremarkable. She was encouraged to stop smoking completely. We'll order nicotine patch and tobacco cessation counseling. Will continue as needed albuterol inhaler. 3. Bipolar disorder/chronic anxiety. We'll continue her chronic medications, including Trileptal, clonazepam, and BuSpar. 4. Pruritic rash. Per review of Dr. Idelle Leech note on 03/12/14,  the patient was prescribed a prednisone taper for treatment. This appeared to have helped, although, at the end of the prednisone taper, her rash and itching has come back. Initially, it was thought that her rash was secondary to Lamictal, therefore  her psychiatrist discontinued it. However, the rash did not resolve. Outpatient ANA was negative; sedimentation rate was modestly elevated, and CRP was within normal limits. The patient believes that the rash is coming from "my nerves". Will treat with small dosing of prednisone and every 8 hour dosing of Benadryl. Will add topical hydrocortisone ointment to the most pruritic areas. Will refer back to her PCP who was considering a dermatology referral or a punch biopsy if she did not improve. 5. Hyperglycemia. This is likely steroid-induced from the previous prednisone taper. Nevertheless, will order a hemoglobin A1c for evaluation. We'll continue to monitor and if her CBGs are consistently above 160, would start sliding scale NovoLog. 6. Leukocytosis. This is likely from the previous prednisone taper and/or from her recent upper respiratory infection. She is afebrile and has no active infective type symptoms currently.     Code Status: Full code DVT Prophylaxis: Lovenox Family Communication: Discussed with her mother Disposition Plan: Anticipate discharge in the next 24 hours.  Time spent: One hour  [facility] Pager [phone]

## 2014-03-23 NOTE — Progress Notes (Signed)
  Echocardiogram 2D Echocardiogram has been performed.  [place], [place] 03/23/2014, 3:46 PM

## 2014-03-23 NOTE — ED Notes (Signed)
Admitting MD at bedside.

## 2014-03-24 ENCOUNTER — Encounter (HOSPITAL_COMMUNITY): Payer: Self-pay | Admitting: Physician Assistant

## 2014-03-24 ENCOUNTER — Other Ambulatory Visit: Payer: Self-pay | Admitting: Family Medicine

## 2014-03-24 ENCOUNTER — Telehealth: Payer: Self-pay | Admitting: Family Medicine

## 2014-03-24 DIAGNOSIS — D72829 Elevated white blood cell count, unspecified: Secondary | ICD-10-CM

## 2014-03-24 DIAGNOSIS — F319 Bipolar disorder, unspecified: Secondary | ICD-10-CM

## 2014-03-24 DIAGNOSIS — E785 Hyperlipidemia, unspecified: Secondary | ICD-10-CM | POA: Diagnosis not present

## 2014-03-24 DIAGNOSIS — R0789 Other chest pain: Secondary | ICD-10-CM | POA: Diagnosis not present

## 2014-03-24 DIAGNOSIS — J449 Chronic obstructive pulmonary disease, unspecified: Secondary | ICD-10-CM

## 2014-03-24 HISTORY — PX: CARDIOVASCULAR STRESS TEST: SHX262

## 2014-03-24 LAB — COMPREHENSIVE METABOLIC PANEL
ALT: 15 U/L (ref 0–35)
AST: 11 U/L (ref 0–37)
Albumin: 3.4 g/dL — ABNORMAL LOW (ref 3.5–5.2)
Alkaline Phosphatase: 44 U/L (ref 39–117)
Anion gap: 7 (ref 5–15)
BUN: 10 mg/dL (ref 6–23)
CO2: 25 mmol/L (ref 19–32)
Calcium: 8.5 mg/dL (ref 8.4–10.5)
Chloride: 106 mEq/L (ref 96–112)
Creatinine, Ser: 0.58 mg/dL (ref 0.50–1.10)
GFR calc Af Amer: >90 mL/min (ref >90)
GFR calc non Af Amer: >90 mL/min (ref >90)
Glucose, Bld: 105 mg/dL — ABNORMAL HIGH (ref 70–99)
Potassium: 3.6 mmol/L (ref 3.5–5.1)
Sodium: 138 mmol/L (ref 135–145)
Total Bilirubin: 0.3 mg/dL (ref 0.3–1.2)
Total Protein: 5.8 g/dL — ABNORMAL LOW (ref 6.0–8.3)

## 2014-03-24 LAB — LIPID PANEL
Cholesterol: 263 mg/dL — ABNORMAL HIGH (ref 0–200)
HDL: 47 mg/dL (ref >39)
LDL Cholesterol: 169 mg/dL — ABNORMAL HIGH (ref 0–99)
Total CHOL/HDL Ratio: 5.6 RATIO
Triglycerides: 237 mg/dL — ABNORMAL HIGH (ref <150)
VLDL: 47 mg/dL — ABNORMAL HIGH (ref 0–40)

## 2014-03-24 LAB — CBC
HCT: 40.2 % (ref 36.0–46.0)
Hemoglobin: 12.9 g/dL (ref 12.0–15.0)
MCH: 28.2 pg (ref 26.0–34.0)
MCHC: 32.1 g/dL (ref 30.0–36.0)
MCV: 87.8 fL (ref 78.0–100.0)
Platelets: 272 10*3/uL (ref 150–400)
RBC: 4.58 MIL/uL (ref 3.87–5.11)
RDW: 15.6 % — ABNORMAL HIGH (ref 11.5–15.5)
WBC: 11.6 10*3/uL — ABNORMAL HIGH (ref 4.0–10.5)

## 2014-03-24 MED ORDER — ATORVASTATIN CALCIUM 20 MG PO TABS
20.0000 mg | ORAL_TABLET | Freq: Every day | ORAL | Status: DC
  Administered 2014-03-24 – 2014-03-25 (×2): 20 mg via ORAL
  Filled 2014-03-24 (×2): qty 1

## 2014-03-24 NOTE — Progress Notes (Signed)
UR completed 

## 2014-03-24 NOTE — Telephone Encounter (Signed)
Patient has admitted to the [facility] for chest pains which turned out to be related to her nerves. Patient will be released today at noon. Patient has the same rash that she has Dr. Anitra Lauth for before. He gave her Rx for Prednisone which is the only thing that has worked. The hospitalist would not write an Rx but has been giving it to her in the hospital. Patient is requesting an Rx to be sent to [facility]. She will call back to make her hospital fu visit when she is released, would like to fu on the rash during that visit.

## 2014-03-24 NOTE — Consult Note (Signed)
Cardiology Consultation Note  Patient ID: Martha Espinoza, MRN: [id number], DOB/AGE: 11/08/1965 48 y.o. Admit date: 03/23/2014   Date of Consult: 03/24/2014 Primary Physician: Tammi Sou, MD Primary Cardiologist: New to Dr. Harrington Challenger  Chief Complaint: chest pain Reason for Consult: chest pain  HPI: Martha Espinoza is a 48 y/o F with history of morbid obesity (previously lost 100lbs, current BMI 60), 25 years of tobacco abuse, remote rectal cancer s/p surgery, bipolar 1 disorder/anxiety, PCOS, newly recognized HLD, family history of CAD who presented to [facility] with complaints of chest pain. She has no prior cardiac hx or workup.  She has been under a lot of stress lately with her son and trying to reconcile her marriage with her prior husband. She has been followed by PCP recently for pruritis/rash of unclear cause treated with prednisone. Earlier this week she noticed transient left sided chest discomfort. It went away with aspirin and she attributed it to stress. On Monday night in the middle of the night she noticed achy substernal chest pressure described as a toothache sensation associated with dyspnea and diaphoresis. She felt flushed. No palpitations, nausea, vomiting or radiation. She took a "nerve pill" without relief. Nothing made the pain worse or better. Eventually it raised to an 8/10. She called a family member in tears and was brought to the ED. Here she 3 sequential NTG over the span of 15 minutes - each NTG brought the pain a little more until it resolved by the 3rd dose. Total duration of pain 6hrs. She also received 324mg  ASA. Troponins negative x 3. WBC 15->11, ? d/t prednisone. A1C 5.9, dyslipidemia with TChol 263, trig 237, HDL 47, LDL 169. She denies BRBPR, melena, hematemesis. She denies any recent exertional chest pain or SOB but does not exercise - the most exertion she says she does is play with her 2 y/o granddaughter. She had a recurrent episode of chest pain last night while lying  in bed without associated arrhythmia. EKG not obtained at that time. The pain resolved spontaneously within minutes. No chest pain this morning. 2D Echo 03/23/14 - normal LV thickness/cavity size, EF 55-60%, no significant valvular disease.  Her mother has had 3 MI's, the first in her late 40's-early 50's. She has multiple family members on her mom's side with heart disease/MIs - only one other person with earlier onset in late 67's-early 50's.  Past Medical History  Diagnosis Date  . Polycystic ovarian syndrome   . Colitis due to Clostridium difficile 2001  . Hepatic hemangioma   . Anxiety   . IBS (irritable bowel syndrome)   . Salmonella   . GERD (gastroesophageal reflux disease)   . Rectal cancer     a. Followed by Dr. Gala Romney, dx 2000-2001. Tumor removed from rectal.  . Bipolar 1 disorder   . Migraine headache   . Asthma   . Arthritis   . Blood transfusion without reported diagnosis   . Depression   . Hypertension     a. Improved without intervention.  . Allergy   . COPD (chronic obstructive pulmonary disease)     a. ? Unclear where dx came from. Pt denies.  . Hyperlipidemia     a. Noted 02/2014.  . Morbid obesity       Most Recent Cardiac Studies: None   Surgical History:  Past Surgical History  Procedure Laterality Date  . Cholecystectomy    . Colonoscopy  05/05/2007    adenoma  . Situ removed  age 89  High-grade rectal adenoma removed from rectum   . Dilation and curettage of uterus      for vaginal bleeding  . Tubal ligation       Home Meds: Prior to Admission medications   Medication Sig Start Date End Date Taking? Authorizing Provider  albuterol (VENTOLIN HFA) 108 (90 BASE) MCG/ACT inhaler Inhale 2 puffs into the lungs every 6 (six) hours as needed for wheezing or shortness of breath. 12/02/13  Yes Tammi Sou, MD  aspirin 81 MG chewable tablet Chew 1 tablet (81 mg total) by mouth daily. 09/06/12  Yes Sharyon Cable, MD  busPIRone (BUSPAR) 10 MG tablet  Take 10 mg by mouth 3 (three) times daily.   Yes Historical Provider, MD  clonazePAM (KLONOPIN) 0.5 MG tablet 1-2 tabs po bid as needed for anxiety 02/23/14  Yes Tammi Sou, MD  diphenhydrAMINE (BENADRYL) 25 mg capsule Take 50 mg by mouth every 6 (six) hours as needed for itching or allergies.   Yes Historical Provider, MD  fexofenadine (ALLEGRA) 180 MG tablet Take 180 mg by mouth daily.   Yes Historical Provider, MD  hydrOXYzine (VISTARIL) 25 MG capsule Take 25 mg by mouth every 6 (six) hours as needed for anxiety.    Yes Historical Provider, MD  Oxcarbazepine (TRILEPTAL) 300 MG tablet Take 300 mg by mouth 2 (two) times daily.   Yes Historical Provider, MD  hydrOXYzine (ATARAX/VISTARIL) 25 MG tablet Take 1 tablet (25 mg total) by mouth every 6 (six) hours. Patient not taking: Reported on 03/23/2014 03/07/14   Veryl Speak, MD  predniSONE (DELTASONE) 20 MG tablet 3 tabs po qd x 3d, then 2 tabs po qd x 3d, then 1 tab po qd x 3d, then 1/2 tab po qd x 4d 03/12/14   Tammi Sou, MD  rizatriptan (MAXALT) 10 MG tablet Take 1 tablet (10 mg total) by mouth as needed for migraine. May repeat in 2 hours if needed Patient not taking: Reported on 03/23/2014 01/15/14   Irene Pap, NP    Inpatient Medications:  . aspirin  81 mg Oral Daily  . atorvastatin  20 mg Oral q1800  . busPIRone  10 mg Oral TID  . clonazePAM  0.5 mg Oral BID  . diphenhydrAMINE  25 mg Oral 3 times per day  . enoxaparin (LOVENOX) injection  40 mg Subcutaneous Q24H  . hydrocortisone cream   Topical TID  . nicotine  14 mg Transdermal Daily  . Oxcarbazepine  300 mg Oral BID  . pantoprazole  40 mg Oral Daily  . predniSONE  10 mg Oral BID WC      Allergies:  Allergies  Allergen Reactions  . Shellfish Allergy Anaphylaxis  . Codeine Nausea And Vomiting    Hallucinations, Also sees people that are not there   . Latex Hives    History   Social History  . Marital Status: Legally Separated    Spouse Name: N/A     Number of Children: 1  . Years of Education: N/A   Occupational History  . own insurance co [employer]    Social History Main Topics  . Smoking status: Current Every Day Smoker -- 0.50 packs/day for 18 years    Types: Cigarettes  . Smokeless tobacco: Never Used     Comment: 25 years  . Alcohol Use: No  . Drug Use: No  . Sexual Activity: No   Other Topics Concern  . Not on file   Social History Narrative  Family History  Problem Relation Age of Onset  . Colon cancer Father 65  . Cancer Father   . Diverticulitis Mother   . Heart disease Mother   . Hypertension Mother   . Hyperlipidemia Mother   . Mental illness Mother   . Diabetes Mother      Review of Systems: No hematuria, no palpitations, no orthopnea, no LEE. All other systems reviewed and are otherwise negative except as noted above.  Labs:  Recent Labs  03/23/14 0803 03/23/14 1106 03/23/14 1621 03/23/14 2227  TROPONINI <0.03 <0.03 <0.03 <0.03   Lab Results  Component Value Date   WBC 11.6* 03/24/2014   HGB 12.9 03/24/2014   HCT 40.2 03/24/2014   MCV 87.8 03/24/2014   PLT 272 03/24/2014     Recent Labs Lab 03/24/14 0602  NA 138  K 3.6  CL 106  CO2 25  BUN 10  CREATININE 0.58  CALCIUM 8.5  PROT 5.8*  BILITOT 0.3  ALKPHOS 44  ALT 15  AST 11  GLUCOSE 105*   Lab Results  Component Value Date   CHOL 263* 03/24/2014   HDL 47 03/24/2014   LDLCALC 169* 03/24/2014   TRIG 237* 03/24/2014   Radiology/Studies:  Dg Chest 2 View  03/23/2014   CLINICAL DATA:  one-two days of productive cough and shortness of breath and chest pain; current tobacco use  EXAM: CHEST  2 VIEW  COMPARISON:  Chest x-ray of November 02, 2012.  FINDINGS: The lungs are adequately inflated and clear. The cardiac silhouette is top-normal in size. The pulmonary vascularity is not engorged. The mediastinum is normal in width. There is no pleural effusion. The bony thorax is unremarkable.  IMPRESSION: There is no active  cardiopulmonary disease.   Electronically Signed   By: David  Martinique   On: 03/23/2014 08:43    EKG:  Initial: sinus tach 102bpm, minimal nonspecific ST sag appearance to II, III, avF, V4-V6 F/u today: NSR 82bpm, TWI III, no acute ST-T changes  Physical Exam: Blood pressure 116/57, pulse 74, temperature 97.8 F (36.6 C), temperature source Oral, resp. rate 18, height 5' (1.524 m), weight 240 lb 11.2 oz (109.181 kg), last menstrual period 03/20/2014, SpO2 98 %. General: Well developed, obese WF in no acute distress. Head: Normocephalic, atraumatic, sclera non-icteric, no xanthomas, nares are without discharge.  Neck: Negative for carotid bruits. JVD not elevated. Lungs: Clear bilaterally to auscultation without wheezes, rales, or rhonchi. Breathing is unlabored. Heart: RRR with S1 S2. No murmurs, rubs, or gallops appreciated. Abdomen: Soft, non-tender, non-distended with normoactive bowel sounds. No hepatomegaly. No rebound/guarding. No obvious abdominal masses. Msk:  Strength and tone appear normal for age. Extremities: No clubbing or cyanosis. No edema.  Distal pedal pulses are 2+ and equal bilaterally. Neuro: Alert and oriented X 3. No facial asymmetry. No focal deficit. Moves all extremities spontaneously. Psych:  Responds to questions appropriately with a normal affect.   Assessment and Plan:   1. Precordial chest pain - Cardiac risk factors include ongoing tobacco, family history, morbid obesity and hyperlipidemia. Her story contains typical and atypical features. Despite 6 hours of discomfort, her troponins were negative, although it was relieved by NTG. Initial EKG had very minimal nonspecific changes on admission but f/u EKG this AM is normal. Top differential includes angina vs gastric irritation due to recent prednisone. Will discuss further evaluation with Dr. Harrington Challenger.  2. Pruritic rash recently treated with prednisone - per IM. May be contributing to leukocytosis. 3. Hyperlipidemia -  agree with statin initiation. Lifestyle modification will also be important. 4. Ongoing tobacco abuse (25 years) - counseled regarding cessation.  5. Morbid obesity - Body mass index is 47.01 kg/(m^2). 6. Remote rectal cancer - she says she is overdue for f/u with Dr. Gala Romney. 7. + RBC in UA - she should probably f/u PCP to ensure clearance. I wrote this in her discharge section.  Signed, Melina Copa PA-C 03/24/2014, 10:37 AM Patient seen and examined  I agree with findings of D Dunn above  patinet with hsitory of morbid obesity, tob use, strong fhx of CAD  Presents with CP  SOme typical and atypical features for angina.  EKGs with subltle changes (ST sagging) Has r/o for MI Given above I would recomm nuclear stress testing to r/o ischemia.   Counselled on tobacco.  Needs to lose wt.    Dorris Carnes

## 2014-03-24 NOTE — Progress Notes (Signed)
[facility] PROGRESS NOTE  Martha Espinoza EQA:[id number] DOB: 1965-08-09 DOA: 03/23/2014 PCP: Tammi Sou, MD  Assessment/Plan: #1 chest pain Patient presented with chest pain with both typical and atypical features. Patient describes chest pain was constant AS a pressure with some radiation to the right side. Patient chest pain was relieved by nitroglycerin in the emergency room. Patient states had a little bit of chest discomfort last night. Patient also with some complaints of fatigue. Patient's risk factors include a family history of premature coronary artery disease in the mother at age 46. Patient also does have a history of tobacco abuse. Fasting lipid panel with LDL of 169. Cardiac enzymes have been negative 3. 2-D echo with normal systolic function with EF of 55-60% with no wall motion abnormalities. Will place patient on a statin. Continue aspirin. Due to patient's story will consult with cardiology for assessment for inpatient versus outpatient stress test.  #2 hyperlipidemia Patient with a LDL of 169. Patient does have a tobacco history. Family history of premature coronary artery disease. Will start patient on a statin secondary to problem #1.  #3 COPD with tobacco abuse Currently stable. Chest x-ray unremarkable. Tobacco cessation. Nicotine patch.  #4 bipolar disorder/chronic anxiety Continue Trileptal, clonazepam, BuSpar.  #5. Pruritic rash Continue current prednisone dose. Continue topical hydrocortisone ointment, Benadryl. Outpatient follow-up.  #6 hyperglycemia Likely steroid-induced. 11 A1c is 5.9. 4.  #7 leukocytosis Likely secondary to steroids. Chest x-ray negative for any acute infiltrate. Urinalysis is nitrite negative leukocytes negative.  #8 prophylaxis PPI for GI prophylaxis. Lovenox for DVT prophylaxis.  Code Status: Full Family Communication: Updated patient and sister-in-law at bed side. Disposition Plan: Remain  inpatient.   Consultants:  None  Procedures:  Chest x-ray 03/23/2014  2-D echo 03/23/2014  Antibiotics:  None  HPI/Subjective: Patient states that the chest pain was described as a pressure with some improvement after aspirin and significant improvement after 3 nitroglycerin in the emergency room. Patient states had an episode of chest pain which she describes as a discomfort last night. Patient states she feels fatigued.  OBjective: Filed Vitals:   03/24/14 0601  BP: 116/57  Pulse: 74  Temp: 97.8 F (36.6 C)  Resp: 18    Intake/Output Summary (Last 24 hours) at 03/24/14 0917 Last data filed at 03/24/14 0522  Gross per 24 hour  Intake   1335 ml  Output      5 ml  Net   1330 ml   Filed Weights   03/23/14 0752 03/23/14 1153 03/24/14 0700  Weight: 107.502 kg (237 lb) 109.2 kg (240 lb 11.9 oz) 109.181 kg (240 lb 11.2 oz)    Exam:   General:  NAD  Cardiovascular: RRR  Respiratory: CTAB  Abdomen: Soft, nontender, nondistended, positive bowel sounds.  Musculoskeletal: No clubbing cyanosis or edema.  Data Reviewed: Basic Metabolic Panel:  Recent Labs Lab 03/23/14 0803 03/24/14 0602  NA 139 138  K 3.9 3.6  CL 108 106  CO2 23 25  GLUCOSE 147* 105*  BUN 13 10  CREATININE 0.69 0.58  CALCIUM 9.2 8.5   Liver Function Tests:  Recent Labs Lab 03/23/14 1106 03/24/14 0602  AST 18 11  ALT 21 15  ALKPHOS 59 44  BILITOT 0.3 0.3  PROT 7.0 5.8*  ALBUMIN 4.2 3.4*   No results for input(s): LIPASE, AMYLASE in the last 168 hours. No results for input(s): AMMONIA in the last 168 hours. CBC:  Recent Labs Lab 03/23/14 0803 03/24/14 0602  WBC 15.1* 11.6*  NEUTROABS 12.2*  --   HGB 14.5 12.9  HCT 44.0 40.2  MCV 86.3 87.8  PLT 348 272   Cardiac Enzymes:  Recent Labs Lab 03/23/14 0803 03/23/14 1106 03/23/14 1621 03/23/14 2227  TROPONINI <0.03 <0.03 <0.03 <0.03   BNP (last 3 results) No results for input(s): PROBNP in the last 8760  hours. CBG: No results for input(s): GLUCAP in the last 168 hours.  No results found for this or any previous visit (from the past 240 hour(s)).   Studies: Dg Chest 2 View  03/23/2014   CLINICAL DATA:  one-two days of productive cough and shortness of breath and chest pain; current tobacco use  EXAM: CHEST  2 VIEW  COMPARISON:  Chest x-ray of November 02, 2012.  FINDINGS: The lungs are adequately inflated and clear. The cardiac silhouette is top-normal in size. The pulmonary vascularity is not engorged. The mediastinum is normal in width. There is no pleural effusion. The bony thorax is unremarkable.  IMPRESSION: There is no active cardiopulmonary disease.   Electronically Signed   By: David  Martinique   On: 03/23/2014 08:43    Scheduled Meds: . aspirin  81 mg Oral Daily  . busPIRone  10 mg Oral TID  . clonazePAM  0.5 mg Oral BID  . diphenhydrAMINE  25 mg Oral 3 times per day  . enoxaparin (LOVENOX) injection  40 mg Subcutaneous Q24H  . hydrocortisone cream   Topical TID  . nicotine  14 mg Transdermal Daily  . Oxcarbazepine  300 mg Oral BID  . pantoprazole  40 mg Oral Daily  . predniSONE  10 mg Oral BID WC   Continuous Infusions: . 0.9 % NaCl with KCl 20 mEq / L 50 mL/hr at 03/24/14 9563    Principal Problem:   Chest pain of uncertain etiology Active Problems:   GERD   Obesity   COPD (chronic obstructive pulmonary disease)   Leukocytosis   Hyperglycemia   Bipolar 1 disorder   Pruritic rash   Chronic anxiety   Tobacco abuse   Hyperlipidemia    Time spent: 13 minutes    Karinne Schmader M.D. [facility] Pager [phone]. If 7PM-7AM, please contact night-coverage at [email or web address], password [email or web address] 03/24/2014, 9:17 AM  LOS: 1 day

## 2014-03-24 NOTE — Care Management Note (Signed)
    Page 1 of 1   03/24/2014     7:30:58 AM CARE MANAGEMENT NOTE 03/24/2014  Patient:  Martha Espinoza, Martha Espinoza   Account Number:  [id number]  Date Initiated:  03/24/2014  Documentation initiated by:  Jolene Provost  Subjective/Objective Assessment:   Pt admitted with chest pain. Pt is from home, lives with husband and independent with activities prior to admission. Pt has no HH services, DME's or med needs prior to admission.     Action/Plan:   Pt plans to discharge home with self care. Pt has no CM needs at this time.   Anticipated DC Date:  03/25/2014   Anticipated DC Plan:  [place]  CM consult      Choice offered to / List presented to:             Status of service:  Completed, signed off Medicare Important Message given?   (If response is "NO", the following Medicare IM given date fields will be blank) Date Medicare IM given:   Medicare IM given by:   Date Additional Medicare IM given:   Additional Medicare IM given by:    Discharge Disposition:  HOME/SELF CARE  Per UR Regulation:    If discussed at Long Length of Stay Meetings, dates discussed:    Comments:  03/24/2014 0730 Jolene Provost, RN, MSN, [facility]

## 2014-03-24 NOTE — Telephone Encounter (Signed)
Please advise 

## 2014-03-24 NOTE — Telephone Encounter (Signed)
It looks like pt is still in hospital getting stress test. I'll call in prednisone when she is discharged from hospital.

## 2014-03-25 ENCOUNTER — Observation Stay (HOSPITAL_COMMUNITY): Payer: PRIVATE HEALTH INSURANCE

## 2014-03-25 ENCOUNTER — Encounter (HOSPITAL_COMMUNITY): Payer: Self-pay

## 2014-03-25 DIAGNOSIS — R0789 Other chest pain: Secondary | ICD-10-CM | POA: Diagnosis not present

## 2014-03-25 DIAGNOSIS — E785 Hyperlipidemia, unspecified: Secondary | ICD-10-CM | POA: Diagnosis not present

## 2014-03-25 DIAGNOSIS — K219 Gastro-esophageal reflux disease without esophagitis: Secondary | ICD-10-CM

## 2014-03-25 DIAGNOSIS — Z720 Tobacco use: Secondary | ICD-10-CM

## 2014-03-25 DIAGNOSIS — D72829 Elevated white blood cell count, unspecified: Secondary | ICD-10-CM | POA: Diagnosis not present

## 2014-03-25 DIAGNOSIS — F319 Bipolar disorder, unspecified: Secondary | ICD-10-CM | POA: Diagnosis not present

## 2014-03-25 LAB — BASIC METABOLIC PANEL
Anion gap: 8 (ref 5–15)
BUN: 13 mg/dL (ref 6–23)
CO2: 26 mmol/L (ref 19–32)
Calcium: 9 mg/dL (ref 8.4–10.5)
Chloride: 104 mEq/L (ref 96–112)
Creatinine, Ser: 0.64 mg/dL (ref 0.50–1.10)
GFR calc Af Amer: >90 mL/min (ref >90)
GFR calc non Af Amer: >90 mL/min (ref >90)
Glucose, Bld: 89 mg/dL (ref 70–99)
Potassium: 4 mmol/L (ref 3.5–5.1)
Sodium: 138 mmol/L (ref 135–145)

## 2014-03-25 LAB — CBC
HCT: 40.2 % (ref 36.0–46.0)
Hemoglobin: 13 g/dL (ref 12.0–15.0)
MCH: 28.1 pg (ref 26.0–34.0)
MCHC: 32.3 g/dL (ref 30.0–36.0)
MCV: 86.8 fL (ref 78.0–100.0)
Platelets: 268 10*3/uL (ref 150–400)
RBC: 4.63 MIL/uL (ref 3.87–5.11)
RDW: 15.5 % (ref 11.5–15.5)
WBC: 10.4 10*3/uL (ref 4.0–10.5)

## 2014-03-25 MED ORDER — PREDNISONE 20 MG PO TABS: ORAL_TABLET | ORAL | Status: DC

## 2014-03-25 MED ORDER — REGADENOSON 0.4 MG/5ML IV SOLN
0.4000 mg | Freq: Once | INTRAVENOUS | Status: AC | PRN
  Administered 2014-03-25: 0.4 mg via INTRAVENOUS
  Filled 2014-03-25: qty 5

## 2014-03-25 MED ORDER — SODIUM CHLORIDE 0.9 % IJ SOLN
10.0000 mL | INTRAMUSCULAR | Status: DC | PRN
  Administered 2014-03-25: 10 mL via INTRAVENOUS
  Filled 2014-03-25: qty 10

## 2014-03-25 MED ORDER — PANTOPRAZOLE SODIUM 40 MG PO TBEC: 40.0000 mg | DELAYED_RELEASE_TABLET | Freq: Every day | ORAL | Status: DC

## 2014-03-25 MED ORDER — RANITIDINE HCL 300 MG PO TABS: 300.0000 mg | ORAL_TABLET | Freq: Every day | ORAL | Status: DC

## 2014-03-25 MED ORDER — SODIUM CHLORIDE 0.9 % IJ SOLN
INTRAMUSCULAR | Status: AC
  Administered 2014-03-25: 10 mL via INTRAVENOUS
  Filled 2014-03-25: qty 36

## 2014-03-25 MED ORDER — NITROGLYCERIN 0.4 MG SL SUBL: 0.4000 mg | SUBLINGUAL_TABLET | SUBLINGUAL | Status: DC | PRN

## 2014-03-25 MED ORDER — TECHNETIUM TC 99M SESTAMIBI - CARDIOLITE
10.0000 | Freq: Once | INTRAVENOUS | Status: AC | PRN
  Administered 2014-03-25: 07:00:00 10 via INTRAVENOUS

## 2014-03-25 MED ORDER — REGADENOSON 0.4 MG/5ML IV SOLN
INTRAVENOUS | Status: AC
  Administered 2014-03-25: 0.4 mg via INTRAVENOUS
  Filled 2014-03-25: qty 5

## 2014-03-25 MED ORDER — PREDNISONE 20 MG PO TABS: 60.0000 mg | ORAL_TABLET | Freq: Every day | ORAL | Status: DC

## 2014-03-25 MED ORDER — ATORVASTATIN CALCIUM 20 MG PO TABS: 20.0000 mg | ORAL_TABLET | Freq: Every day | ORAL | Status: DC

## 2014-03-25 MED ORDER — TECHNETIUM TC 99M SESTAMIBI GENERIC - CARDIOLITE
30.0000 | Freq: Once | INTRAVENOUS | Status: AC | PRN
  Administered 2014-03-25: 30 via INTRAVENOUS

## 2014-03-25 MED ORDER — SODIUM CHLORIDE 0.9 % IJ SOLN
INTRAMUSCULAR | Status: AC
  Filled 2014-03-25: qty 10

## 2014-03-25 MED ORDER — HYDROCORTISONE 1 % EX CREA: TOPICAL_CREAM | Freq: Three times a day (TID) | CUTANEOUS | Status: DC

## 2014-03-25 NOTE — Discharge Summary (Signed)
Physician Discharge Summary  Martha Espinoza EPP:[id number] DOB: 11-12-1965 DOA: 03/23/2014  PCP: Tammi Sou, MD  Admit date: 03/23/2014 Discharge date: 03/25/2014  Time spent: 70 minutes  Recommendations for Outpatient Follow-up:  1. Follow-up the [facility] radiology on Monday, 03/29/2014 at 12:30 PM for cardiac CT angiogram. 2. Follow-up with Tammi Sou, MD in 1 week for hospital follow-up. On follow-up patient need a CBC and a basic metabolic profile done to follow-up on electrolytes and renal function. Patient is rash will need to be followed up upon. Patient was started on a statin during this hospitalization for hyperlipidemia. Patient will need a comprehensive metabolic profile done in about 4-6 weeks to follow-up on LFTs.  Discharge Diagnoses:  Principal Problem:   Chest pain of uncertain etiology Active Problems:   GERD   Obesity   COPD (chronic obstructive pulmonary disease)   Leukocytosis   Hyperglycemia   Bipolar 1 disorder   Pruritic rash   Chronic anxiety   Tobacco abuse   Hyperlipidemia   Morbid obesity   Discharge Condition: Stable and improved  Diet recommendation: Heart healthy  Filed Weights   03/23/14 1153 03/24/14 0700 03/25/14 0642  Weight: 109.2 kg (240 lb 11.9 oz) 109.181 kg (240 lb 11.2 oz) 109 kg (240 lb 4.8 oz)    History of present illness:  Martha Espinoza is a 48 y.o. female with a history of bipolar disorder, chronic anxiety, and COPD, who presented to the emergency department with a chief complaint of chest pain. Her pain started 2 days prior to admission and then resolved spontaneously. It occurred again early on the morning of admission, after midnight while she was attempting to go to sleep. It was located to the left of the sternum. It sometimes radiated to the right. She described it as a "toothache" and "pressure". At its worse, it is a 10 over 10 in intensity. She took a baby aspirin and clonazepam which eased the pain a  little. The pain is associated with shortness of breath, mild sweatiness, and lightheadedness. She denied pleurisy, nausea/vomiting, swelling/pain in her legs, or recent heavy lifting. She denied any recent travel or immobilization. She had occasional heartburn, but not on a consistent basis. She has had an increase in stress/anxiety because her son was recently in a near fatal motor vehicle accident. She also complained about a pruritic rash that she has had on and off for a couple of months. She was recently treated with a prednisone taper by her PCP. The rash and the itching subsided, but at the end of the prednisone taper, the itching and the rash have returned. She also had a "cold" with a productive cough 1 week ago, but it has subsided.  In the ED, she was given 3 sublingual nitroglycerin which took her pain intensity to almost 0. Her lab data was significant for a normal troponin I, glucose of 147, and W BC of 15.1. Her chest x-ray reveals no acute cardiopulmonary disease. Her EKG revealed sinus tachycardia with a heart rate of 102 bpm, but no concerning ST or T-wave changes. She was admitted for further evaluation and management.  Hospital Course:  #1 chest pain Patient presented with chest pain with both typical and atypical features. Patient described chest pain was constant pressure with some radiation to the right side. Patient chest pain was relieved by nitroglycerin in the emergency room. Patient stated had a little bit of chest discomfort during the first night of the hospitalization and did not have any  further chest discomfort after that. Patient also with some complaints of fatigue. Patient's risk factors included a family history of premature coronary artery disease in the mother at age 63. Patient also does have a history of tobacco abuse. Fasting lipid panel with LDL of 169. Cardiac enzymes were done and were negative 3. 2-D echo with normal systolic function with EF of 55-60% with no  wall motion abnormalities. Patient was started on a statin as well as aspirin. Due to patient's risk factors and a story with both typical and atypical features cardiology consultation was obtained and patient was in consultation by Dr. Harrington Challenger on 03/24/2014. It was recommended per cardiology as patient did have some ST sagging/subtle changes on EKG that patient undergo a nuclear stress test to rule out ischemia. Patient subsequently was scheduled for nuclear stress test however patient was unable to walk on the treadmill because it made her feel dizzy and sick was subsequently changed Lexiscan/Cardiolite. No acute ST-T wave changes, no hypotension. Lexiscan MIBI scan was concerning for evidence of mild anterior ischemia, however difficult study due to patient's breast size attenuation and a such cardiac CT angiogram was recommended per cardiology. Patient has been scheduled for cardiac CT angiogram on Monday March 29 2014 at [facility]. Patient will be premedicated. Patient will be discharged in stable condition and will follow-up for cardiac CT angiogram . Patient be discharged in stable condition.  #2 hyperlipidemia Patient with a LDL of 169. Patient does have a tobacco history. Family history of premature coronary artery disease. Patient was started on a statin and will be discharged on a statin. Patient need a comprehensive metabolic profile done in about 4-6 weeks to follow-up on LFTs. Outpatient follow-up.   #3 COPD with tobacco abuse Currently stable. Chest x-ray unremarkable. Tobacco cessation. Patient was placed on a nicotine patch. Patient stated she wasn't ready to quit tobacco use at this time although she was counseled against.   #4 bipolar disorder/chronic anxiety Continued on home regimen of Trileptal, clonazepam, BuSpar.  #5. Pruritic rash Patient was placed on a small dose of prednisone during the hospitalization as well as topical hydrocortisone ointment, Benadryl. Patient's  pruritic rash is being worked up by PCP patient will follow-up with PCP as outpatient.   #6 hyperglycemia Likely steroid-induced. 11 A1c is 5.9. Outpatient follow-up.  #7 leukocytosis Likely secondary to steroids. Chest x-ray negative for any acute infiltrate. Urinalysis was nitrite negative leukocytes negative. Patient remained afebrile throughout the hospitalization. Outpatient follow-up.   Procedures:  Chest x-ray 03/23/2014  2-D echo 03/23/2014  Lexi scan Myoview scan 03/25/2014  Consultations:  Cardiology : Dr Harrington Challenger 03/24/14  Discharge Exam: Filed Vitals:   03/25/14 1438  BP: 122/82  Pulse: 88  Temp: 98.5 F (36.9 C)  Resp:     General: NAD Cardiovascular: RRR Respiratory: CTAB  Discharge Instructions   Discharge Instructions    Diet - low sodium heart healthy    Complete by:  As directed      Discharge instructions    Complete by:  As directed   Patient scheduled for Cardiac CT on Monday 03/29/14. Follow up with MCGOWEN,PHILIP H, MD in 1 week.     Increase activity slowly    Complete by:  As directed           Current Discharge Medication List    START taking these medications   Details  atorvastatin (LIPITOR) 20 MG tablet Take 1 tablet (20 mg total) by mouth daily at 6 PM. Qty:  30 tablet, Refills: 0    hydrocortisone cream 1 % Apply topically 3 (three) times daily. Qty: 30 g, Refills: 0    nitroGLYCERIN (NITROSTAT) 0.4 MG SL tablet Place 1 tablet (0.4 mg total) under the tongue every 5 (five) minutes as needed for chest pain. Qty: 20 tablet, Refills: 0    pantoprazole (PROTONIX) 40 MG tablet Take 1 tablet (40 mg total) by mouth daily. Qty: 30 tablet, Refills: 0    ranitidine (ZANTAC) 300 MG tablet Take 1 tablet (300 mg total) by mouth at bedtime. Take 1 tablet on Sunday night and Monday morning. Qty: 5 tablet, Refills: 0      CONTINUE these medications which have CHANGED   Details  predniSONE (DELTASONE) 20 MG tablet Take 3 tablets (60 mg  total) by mouth daily. Take 3 tablets (60mg ) on Sunday night and Monday Morning. Qty: 6 tablet, Refills: 0      CONTINUE these medications which have NOT CHANGED   Details  albuterol (VENTOLIN HFA) 108 (90 BASE) MCG/ACT inhaler Inhale 2 puffs into the lungs every 6 (six) hours as needed for wheezing or shortness of breath. Qty: 1 Inhaler, Refills: 1    aspirin 81 MG chewable tablet Chew 1 tablet (81 mg total) by mouth daily. Qty: 14 tablet, Refills: 0    busPIRone (BUSPAR) 10 MG tablet Take 10 mg by mouth 3 (three) times daily.    clonazePAM (KLONOPIN) 0.5 MG tablet 1-2 tabs po bid as needed for anxiety Qty: 60 tablet, Refills: 1    diphenhydrAMINE (BENADRYL) 25 mg capsule Take 50 mg by mouth every 6 (six) hours as needed for itching or allergies.    fexofenadine (ALLEGRA) 180 MG tablet Take 180 mg by mouth daily.    hydrOXYzine (VISTARIL) 25 MG capsule Take 25 mg by mouth every 6 (six) hours as needed for anxiety.     Oxcarbazepine (TRILEPTAL) 300 MG tablet Take 300 mg by mouth 2 (two) times daily.    hydrOXYzine (ATARAX/VISTARIL) 25 MG tablet Take 1 tablet (25 mg total) by mouth every 6 (six) hours. Qty: 20 tablet, Refills: 0    rizatriptan (MAXALT) 10 MG tablet Take 1 tablet (10 mg total) by mouth as needed for migraine. May repeat in 2 hours if needed Qty: 10 tablet, Refills: 0   Associated Diagnoses: Nonintractable migraine, unspecified migraine type       Allergies  Allergen Reactions  . Shellfish Allergy Anaphylaxis  . Codeine Nausea And Vomiting    Hallucinations, Also sees people that are not there   . Latex Hives   Follow-up Information    Follow up with Tammi Sou, MD.   Specialty:  Family Medicine   Why:  Your urinalysis showed trace blood in it. Follow up with your primary care doctor to make sure this resolves.   Contact information:   [address] [place] [address] [phone]       Follow up with Tammi Sou, MD. Schedule an  appointment as soon as possible for a visit in 1 week.   Specialty:  Family Medicine   Contact information:   [address] [place]  [address] [phone]       Follow up with [facility] On 03/29/2014.   Why:  Go to radiology department at 1230pm for cardiac CT. No caffeine. No food after 8AM on 03/29/14   Contact information:   [place] [address] [phone]  The results of significant diagnostics from this hospitalization (including imaging, microbiology, ancillary and laboratory) are listed below for reference.    Significant Diagnostic Studies: Dg Chest 2 View  03/23/2014   CLINICAL DATA:  one-two days of productive cough and shortness of breath and chest pain; current tobacco use  EXAM: CHEST  2 VIEW  COMPARISON:  Chest x-ray of November 02, 2012.  FINDINGS: The lungs are adequately inflated and clear. The cardiac silhouette is top-normal in size. The pulmonary vascularity is not engorged. The mediastinum is normal in width. There is no pleural effusion. The bony thorax is unremarkable.  IMPRESSION: There is no active cardiopulmonary disease.   Electronically Signed   By: David  Martinique   On: 03/23/2014 08:43   Nm Myocar Multi W/spect W/wall Motion / Ef  03/25/2014   CLINICAL DATA:  Patient is a 48 yo with CP Test to evaluate, rule out ischemia.  EXAM: MYOCARDIAL IMAGING WITH SPECT (REST AND PHARMACOLOGIC-STRESS)  GATED LEFT VENTRICULAR WALL MOTION STUDY  LEFT VENTRICULAR EJECTION FRACTION  TECHNIQUE: Standard myocardial SPECT imaging was performed after resting intravenous injection of 10 mCi Tc-58m sestamibi. Subsequently, intravenous infusion of Lexiscan was performed under the supervision of the Cardiology staff. At peak effect of the drug, 30 mCi Tc-55m sestamibi was injected intravenously and standard myocardial SPECT imaging was performed. Quantitative gated imaging was also performed to evaluate left ventricular  wall motion, and estimate left ventricular ejection fraction.  COMPARISON:  None.  FINDINGS: Stress data: Baseline EKG: SR 76 bpm Baseline BP 119/81 With Lexiscan infusion there were no significant ST changes to suggest ischemia.  Nuclear data: In the initial stress images there was mild thinning in the anterior wall (mid, distal), and distal inferolateral wall. Note transient ischemic dilatation (TID = 1.41)  In the rest images perfusion was normal  On gating LVEF was calculated at 43% with inferior and septal hypokinesis.  On review of the raw data there is extensive soft tissue surrounding heart (breast, gut activity). There does appear to be some difference in breast tissue shadowing between the rest and stress images.  IMPRESSION: Lexiscan Sestamibi: Electrically negative for ischemia. MIBI scan with evidence of possible mild ischemia in the anterior and distal inferolateral wall. Cannot exclude that changes due to shifting soft tissue attenuation.  LVEF on gating calculated at 43% (echo demonstrated LVEF of 55 to 60%) Consider CT angiogram to further define coronary anatomy.   Electronically Signed   By: Dorris Carnes M.D.   On: 03/25/2014 15:44    Microbiology: No results found for this or any previous visit (from the past 240 hour(s)).   Labs: Basic Metabolic Panel:  Recent Labs Lab 03/23/14 0803 03/24/14 0602 03/25/14 0510  NA 139 138 138  K 3.9 3.6 4.0  CL 108 106 104  CO2 23 25 26   GLUCOSE 147* 105* 89  BUN 13 10 13   CREATININE 0.69 0.58 0.64  CALCIUM 9.2 8.5 9.0   Liver Function Tests:  Recent Labs Lab 03/23/14 1106 03/24/14 0602  AST 18 11  ALT 21 15  ALKPHOS 59 44  BILITOT 0.3 0.3  PROT 7.0 5.8*  ALBUMIN 4.2 3.4*   No results for input(s): LIPASE, AMYLASE in the last 168 hours. No results for input(s): AMMONIA in the last 168 hours. CBC:  Recent Labs Lab 03/23/14 0803 03/24/14 0602 03/25/14 0510  WBC 15.1* 11.6* 10.4  NEUTROABS 12.2*  --   --   HGB 14.5 12.9  13.0  HCT 44.0 40.2  40.2  MCV 86.3 87.8 86.8  PLT 348 272 268   Cardiac Enzymes:  Recent Labs Lab 03/23/14 0803 03/23/14 1106 03/23/14 1621 03/23/14 2227  TROPONINI <0.03 <0.03 <0.03 <0.03   BNP: BNP (last 3 results) No results for input(s): PROBNP in the last 8760 hours. CBG: No results for input(s): GLUCAP in the last 168 hours.     SignedIrine Seal MD [facility] 03/25/2014, 6:48 PM

## 2014-03-25 NOTE — Progress Notes (Signed)
Consulting cardiologist: Dorris Carnes MD Primary Cardiologist: Dorris Carnes MD  Cardiology Specific Problem List: 1. Chest Pain   Subjective:    Patient without chest pain overnight. Patient examined in stress lab prior to NM study.  Objective:   Temp:  [97.9 F (36.6 C)-98.3 F (36.8 C)] 98.3 F (36.8 C) (12/31 9449) Pulse Rate:  [76-89] 76 (12/31 0642) Resp:  [20] 20 (12/31 0642) BP: (127-130)/(70-76) 128/75 mmHg (12/31 0642) SpO2:  [95 %-98 %] 98 % (12/31 0751) Weight:  [240 lb 4.8 oz (109 kg)] 240 lb 4.8 oz (109 kg) (12/31 0642) Last BM Date: 03/23/14  Filed Weights   03/23/14 1153 03/24/14 0700 03/25/14 0642  Weight: 240 lb 11.9 oz (109.2 kg) 240 lb 11.2 oz (109.181 kg) 240 lb 4.8 oz (109 kg)    Intake/Output Summary (Last 24 hours) at 03/25/14 0826 Last data filed at 03/25/14 0500  Gross per 24 hour  Intake 1106.67 ml  Output   1052 ml  Net  54.67 ml    Telemetry: Sinus tachycardia rate of 90-100 bpm  Exam:  General: No acute distress.  HEENT: Conjunctiva and lids normal, oropharynx clear.  Lungs: Clear to auscultation, nonlabored.  Cardiac: No elevated JVP or bruits. RRR, no gallop or rub.   Abdomen: Normoactive bowel sounds, nontender, nondistended.  Extremities: No pitting edema, distal pulses full.  Neuropsychiatric: Alert and oriented x3, affect appropriate.   Lab Results:  Basic Metabolic Panel:  Recent Labs Lab 03/23/14 0803 03/24/14 0602 03/25/14 0510  NA 139 138 138  K 3.9 3.6 4.0  CL 108 106 104  CO2 23 25 26   GLUCOSE 147* 105* 89  BUN 13 10 13   CREATININE 0.69 0.58 0.64  CALCIUM 9.2 8.5 9.0    Liver Function Tests:  Recent Labs Lab 03/23/14 1106 03/24/14 0602  AST 18 11  ALT 21 15  ALKPHOS 59 44  BILITOT 0.3 0.3  PROT 7.0 5.8*  ALBUMIN 4.2 3.4*    CBC:  Recent Labs Lab 03/23/14 0803 03/24/14 0602 03/25/14 0510  WBC 15.1* 11.6* 10.4  HGB 14.5 12.9 13.0  HCT 44.0 40.2 40.2  MCV 86.3 87.8 86.8  PLT 348  272 268    Cardiac Enzymes:  Recent Labs Lab 03/23/14 1106 03/23/14 1621 03/23/14 2227  TROPONINI <0.03 <0.03 <0.03    Radiology: Dg Chest 2 View  03/23/2014   CLINICAL DATA:  one-two days of productive cough and shortness of breath and chest pain; current tobacco use  EXAM: CHEST  2 VIEW  COMPARISON:  Chest x-ray of November 02, 2012.  FINDINGS: The lungs are adequately inflated and clear. The cardiac silhouette is top-normal in size. The pulmonary vascularity is not engorged. The mediastinum is normal in width. There is no pleural effusion. The bony thorax is unremarkable.  IMPRESSION: There is no active cardiopulmonary disease.   Electronically Signed   By: David  Martinique   On: 03/23/2014 08:43     Medications:   Scheduled Medications: . aspirin  81 mg Oral Daily  . atorvastatin  20 mg Oral q1800  . busPIRone  10 mg Oral TID  . clonazePAM  0.5 mg Oral BID  . diphenhydrAMINE  25 mg Oral 3 times per day  . enoxaparin (LOVENOX) injection  40 mg Subcutaneous Q24H  . hydrocortisone cream   Topical TID  . nicotine  14 mg Transdermal Daily  . Oxcarbazepine  300 mg Oral BID  . pantoprazole  40 mg Oral Daily  . predniSONE  10 mg Oral BID WC  . sodium chloride            PRN Medications: acetaminophen **OR** acetaminophen, albuterol, alum & mag hydroxide-simeth, clonazePAM, guaiFENesin-dextromethorphan, HYDROmorphone (DILAUDID) injection, nitroGLYCERIN, ondansetron **OR** ondansetron (ZOFRAN) IV, sodium chloride, technetium sestamibi generic   Assessment and Plan:   1.Chest Pain: Atypical No chest pain overnight. She was unable to walk on treadmill because it made her dizzy and sick. Changed to Lexiscan/Cardiolite. No acute ST-T wave changes, no hypotension.Troponin negative x 3. Likely anxiety induced vs GI. If NM study is normal can go home.   2. Hyperlipidemia: Now on statin therapy.  3. Tobacco abuse: Cessation strongly recommended.   Phill Myron. Lawrence NP  [place]  03/25/2014, 8:26 AM   Patient seen and examined.  I agree with findings of K Lawrence above. Denies CP  Exam RRR  No murmurs   Nuclear stress test today Agree with statin Rx.    Dorris Carnes

## 2014-03-25 NOTE — Progress Notes (Signed)
Discharged instructions given on medications,and follow up visits,patient verbalized understanding. Patient is to follow up at [facility] for a Cardiac CT,information,and instructions given to patient, who verbalized understanding of the instructions. No c/o pain or discomfort noted. Prescriptions sent with patient. Staff accompanied patient to awaiting vehicle.

## 2014-03-25 NOTE — Progress Notes (Signed)
Stress Lab Nurses Notes - Martha Espinoza  Martha Espinoza 03/25/2014 Reason for doing test: Chest Pain Type of test: Test Changed unable to finish walking on TM due to dizziness.  Lexiscan Cardiolite given. Inpatient Rm # [phone] Nurse performing test: Gerrit Halls, RN Nuclear Medicine Tech: Melburn Hake Echo Tech: Not Applicable MD performing test: P. Ross/K.Purcell Nails NP Family MD: McGowen Test explained and consent signed: Yes.   IV started: Saline lock flushed, No redness or edema and Saline lock from floor Symptoms: headache & Flushed Treatment/Intervention: None Reason test stopped: protocol completed After recovery IV was: No redness or edema and Saline Lock flushed Patient to return to [place]. Med at : 9:00 Patient discharged: Transported back to room 302 via wc Patient's Condition upon discharge was: stable Comments: During test BP 137/72 & HR 117.  Recovery BP 129/74 & HR 92.  Symptoms resolved in recovery.  Martha Espinoza

## 2014-03-25 NOTE — Telephone Encounter (Signed)
Prednisone eRx'd as per pt request.

## 2014-03-25 NOTE — Progress Notes (Signed)
UR completed 

## 2014-03-25 NOTE — Telephone Encounter (Signed)
Patient states she will be getting out of hospital today.   Please sent prednisone Rx to [facility] pharmacy.

## 2014-03-25 NOTE — Progress Notes (Signed)
I have reviewed Lexiscan MIBI scan  There is evid for mild anteior ischemia  But, very difficult study with her size/ breast attenuation. I would recomm Cardiac CT angiogram.  Would plan for Monday Jan 4th  At 1 PM At [facility]  Go to radiology department at [facility] (1st floor) by 12:30 PM  No caffeine that morning   NPO 4 hours prior.   WIll need pretreatment with Prednisone 60 mg on Sunday night (1/3) and Monday morning along with Zantac Sunday night and Monday morning as well.    Send home on ecASA 81 mg and NTG prn.  I have discussed with patient

## 2014-03-27 ENCOUNTER — Encounter: Payer: Self-pay | Admitting: Family Medicine

## 2014-03-29 ENCOUNTER — Ambulatory Visit (HOSPITAL_COMMUNITY)
Admission: RE | Admit: 2014-03-29 | Discharge: 2014-03-29 | Disposition: A | Discharge status: Home / Self Care | Payer: PRIVATE HEALTH INSURANCE | Source: Ambulatory Visit | Attending: Internal Medicine | Admitting: Internal Medicine

## 2014-03-29 ENCOUNTER — Inpatient Hospital Stay (HOSPITAL_COMMUNITY): Admit: 2014-03-29 | Payer: PRIVATE HEALTH INSURANCE

## 2014-03-29 ENCOUNTER — Encounter (HOSPITAL_COMMUNITY): Payer: Self-pay

## 2014-03-29 ENCOUNTER — Other Ambulatory Visit: Payer: Self-pay | Admitting: Internal Medicine

## 2014-03-29 DIAGNOSIS — I2510 Atherosclerotic heart disease of native coronary artery without angina pectoris: Secondary | ICD-10-CM | POA: Diagnosis not present

## 2014-03-29 DIAGNOSIS — R911 Solitary pulmonary nodule: Secondary | ICD-10-CM | POA: Diagnosis not present

## 2014-03-29 DIAGNOSIS — R079 Chest pain, unspecified: Secondary | ICD-10-CM | POA: Insufficient documentation

## 2014-03-29 DIAGNOSIS — R9439 Abnormal result of other cardiovascular function study: Secondary | ICD-10-CM

## 2014-03-29 HISTORY — PX: OTHER SURGICAL HISTORY: SHX169

## 2014-03-29 MED ORDER — METOPROLOL TARTRATE 1 MG/ML IV SOLN
INTRAVENOUS | Status: AC
  Filled 2014-03-29: qty 5

## 2014-03-29 MED ORDER — METOPROLOL TARTRATE 1 MG/ML IV SOLN
5.0000 mg | Freq: Once | INTRAVENOUS | Status: AC
  Administered 2014-03-29: 5 mg via INTRAVENOUS
  Filled 2014-03-29: qty 5

## 2014-03-29 MED ORDER — ACETAMINOPHEN 325 MG PO TABS
ORAL_TABLET | ORAL | Status: AC
  Filled 2014-03-29: qty 2

## 2014-03-29 MED ORDER — NITROGLYCERIN 0.4 MG SL SUBL
SUBLINGUAL_TABLET | SUBLINGUAL | Status: AC
  Filled 2014-03-29: qty 2

## 2014-03-29 MED ORDER — NITROGLYCERIN 0.4 MG SL SUBL
0.4000 mg | SUBLINGUAL_TABLET | SUBLINGUAL | Status: DC | PRN
  Administered 2014-03-29: 0.4 mg via SUBLINGUAL
  Filled 2014-03-29: qty 25

## 2014-03-29 MED ORDER — IOHEXOL 350 MG/ML SOLN
80.0000 mL | Freq: Once | INTRAVENOUS | Status: AC | PRN
  Administered 2014-03-29: 80 mL via INTRAVENOUS

## 2014-03-30 ENCOUNTER — Other Ambulatory Visit: Payer: Self-pay | Admitting: Internal Medicine

## 2014-03-30 ENCOUNTER — Telehealth: Payer: Self-pay

## 2014-03-30 DIAGNOSIS — R072 Precordial pain: Secondary | ICD-10-CM

## 2014-03-30 MED ORDER — RANITIDINE HCL 300 MG PO TABS: ORAL_TABLET | ORAL | Status: DC

## 2014-03-30 MED ORDER — PREDNISONE 20 MG PO TABS: ORAL_TABLET | ORAL | Status: DC

## 2014-03-30 NOTE — H&P (View-Only) (Signed)
I have reviewed Lexiscan MIBI scan  There is evid for mild anteior ischemia  But, very difficult study with her size/ breast attenuation. I would recomm Cardiac CT angiogram.  Would plan for Monday Jan 4th  At 1 PM At [facility]  Go to radiology department at [facility] (1st floor) by 12:30 PM  No caffeine that morning   NPO 4 hours prior.   WIll need pretreatment with Prednisone 60 mg on Sunday night (1/3) and Monday morning along with Zantac Sunday night and Monday morning as well.    Send home on ecASA 81 mg and NTG prn.  I have discussed with patient

## 2014-03-30 NOTE — Telephone Encounter (Signed)
Pt called and stated she takes Klonopin 0.5mg  and Benadryl 25 mg in middle of night if starts to itch.  Told it was okay to take with sip of water but if she did needs to take that this it will count as her morning Benadryl Pre-cath dose.  She is not to take another Benadryl before coming to hospital.   Advised pt to call back if thought of any additional questions.

## 2014-03-30 NOTE — Telephone Encounter (Signed)
Spoke with pt to verify allergy reactions and pharmacy. Pt aware of cath told her I would call back with details once scheduld.   Pt instructed to take all medications on regular schedule in addition to pre-procedure medications of Prednisone 60 mg night before and morning of cath, Zantac 300 mg night before and morning of cath, and Benadryl 25 mg morning of cath.   Your physician has requested that you have a cardiac catheterization on Wednesday, 03/31/14 at 8am. Cardiac catheterization is used to diagnose and/or treat various heart conditions. Doctors may recommend this procedure for a number of different reasons. The most common reason is to evaluate chest pain. Chest pain can be a symptom of coronary artery disease (CAD), and cardiac catheterization can show whether plaque is narrowing or blocking your heart's arteries. This procedure is also used to evaluate the valves, as well as measure the blood flow and oxygen levels in different parts of your heart. For further information please visit [email or web address]. Please follow instruction sheet, as given.   Reviewed pre cath instructions on telephone with pt.  Pt verbalized understanding and agreement.   Staff message sent to Dr. Harrington Challenger, by Sharyn Lull, for PT/INR and PRN IV Benadryl day of procedure.

## 2014-03-30 NOTE — Interval H&P Note (Signed)
Cath Lab Visit (complete for each Cath Lab visit)  Clinical Evaluation Leading to the Procedure:   ACS: Yes.    Non-ACS:    Anginal Classification: CCS III  Anti-ischemic medical therapy: Maximal Therapy (2 or more classes of medications)  Non-Invasive Test Results: Intermediate-risk stress test findings: cardiac mortality 1-3%/year  Prior CABG: No previous CABG      History and Physical Interval Note:  03/30/2014 8:58 PM  Martha Espinoza  has presented today for surgery, with the diagnosis of cp  The various methods of treatment have been discussed with the patient and family. After consideration of risks, benefits and other options for treatment, the patient has consented to  Procedure(s): LEFT HEART CATHETERIZATION WITH CORONARY ANGIOGRAM (N/A) as a surgical intervention .  The patient's history has been reviewed, patient examined, no change in status, stable for surgery.  I have reviewed the patient's chart and labs.  Questions were answered to the patient's satisfaction.     Sinclair Grooms

## 2014-03-31 ENCOUNTER — Encounter (HOSPITAL_COMMUNITY)
Admission: RE | Disposition: A | Discharge status: Home / Self Care | Payer: Self-pay | Source: Ambulatory Visit | Attending: Interventional Cardiology

## 2014-03-31 ENCOUNTER — Encounter (HOSPITAL_COMMUNITY): Payer: Self-pay | Admitting: Interventional Cardiology

## 2014-03-31 ENCOUNTER — Ambulatory Visit (HOSPITAL_COMMUNITY)
Admission: RE | Admit: 2014-03-31 | Discharge: 2014-03-31 | Disposition: A | Discharge status: Home / Self Care | Payer: PRIVATE HEALTH INSURANCE | Source: Ambulatory Visit | Attending: Interventional Cardiology | Admitting: Interventional Cardiology

## 2014-03-31 DIAGNOSIS — E785 Hyperlipidemia, unspecified: Secondary | ICD-10-CM | POA: Diagnosis present

## 2014-03-31 DIAGNOSIS — R079 Chest pain, unspecified: Secondary | ICD-10-CM | POA: Diagnosis present

## 2014-03-31 DIAGNOSIS — J449 Chronic obstructive pulmonary disease, unspecified: Secondary | ICD-10-CM | POA: Diagnosis present

## 2014-03-31 DIAGNOSIS — R0789 Other chest pain: Secondary | ICD-10-CM

## 2014-03-31 DIAGNOSIS — R9439 Abnormal result of other cardiovascular function study: Secondary | ICD-10-CM

## 2014-03-31 DIAGNOSIS — R931 Abnormal findings on diagnostic imaging of heart and coronary circulation: Secondary | ICD-10-CM | POA: Diagnosis present

## 2014-03-31 DIAGNOSIS — R072 Precordial pain: Secondary | ICD-10-CM

## 2014-03-31 HISTORY — PX: LEFT HEART CATHETERIZATION WITH CORONARY ANGIOGRAM: SHX5451

## 2014-03-31 LAB — PROTIME-INR
INR: 0.99 (ref 0.00–1.49)
Prothrombin Time: 13.2 seconds (ref 11.6–15.2)

## 2014-03-31 LAB — HCG, SERUM, QUALITATIVE: Preg, Serum: NEGATIVE

## 2014-03-31 SURGERY — LEFT HEART CATHETERIZATION WITH CORONARY ANGIOGRAM
Anesthesia: LOCAL

## 2014-03-31 MED ORDER — DIPHENHYDRAMINE HCL 50 MG/ML IJ SOLN: 25.0000 mg | INTRAMUSCULAR | Status: DC

## 2014-03-31 MED ORDER — ASPIRIN 81 MG PO CHEW: 81.0000 mg | CHEWABLE_TABLET | ORAL | Status: DC

## 2014-03-31 MED ORDER — SODIUM CHLORIDE 0.9 % IV SOLN
INTRAVENOUS | Status: DC
  Administered 2014-03-31: 10:00:00 via INTRAVENOUS

## 2014-03-31 MED ORDER — PREDNISONE 20 MG PO TABS
ORAL_TABLET | ORAL | Status: AC
  Filled 2014-03-31: qty 3

## 2014-03-31 MED ORDER — HEPARIN (PORCINE) IN NACL 2-0.9 UNIT/ML-% IJ SOLN
INTRAMUSCULAR | Status: AC
  Filled 2014-03-31: qty 1500

## 2014-03-31 MED ORDER — FAMOTIDINE IN NACL 20-0.9 MG/50ML-% IV SOLN
INTRAVENOUS | Status: AC
  Filled 2014-03-31: qty 50

## 2014-03-31 MED ORDER — SODIUM CHLORIDE 0.9 % IJ SOLN: 3.0000 mL | Freq: Two times a day (BID) | INTRAMUSCULAR | Status: DC

## 2014-03-31 MED ORDER — SODIUM CHLORIDE 0.9 % IJ SOLN: 3.0000 mL | INTRAMUSCULAR | Status: DC | PRN

## 2014-03-31 MED ORDER — DIPHENHYDRAMINE HCL 50 MG/ML IJ SOLN
INTRAMUSCULAR | Status: AC
  Filled 2014-03-31: qty 1

## 2014-03-31 MED ORDER — PREDNISONE 20 MG PO TABS
60.0000 mg | ORAL_TABLET | ORAL | Status: DC
Start: 2014-04-01 — End: 2014-03-31

## 2014-03-31 MED ORDER — ACETAMINOPHEN 325 MG PO TABS
650.0000 mg | ORAL_TABLET | ORAL | Status: DC | PRN
Start: 2014-03-31 — End: 2014-03-31

## 2014-03-31 MED ORDER — SODIUM CHLORIDE 0.9 % IV SOLN
INTRAVENOUS | Status: AC
  Administered 2014-03-31: 12:00:00 via INTRAVENOUS

## 2014-03-31 MED ORDER — METHYLPREDNISOLONE SODIUM SUCC 125 MG IJ SOLR
INTRAMUSCULAR | Status: AC
  Filled 2014-03-31: qty 2

## 2014-03-31 MED ORDER — SODIUM CHLORIDE 0.9 % IV SOLN: 250.0000 mL | INTRAVENOUS | Status: DC | PRN

## 2014-03-31 MED ORDER — ASPIRIN 81 MG PO CHEW
CHEWABLE_TABLET | ORAL | Status: AC
  Filled 2014-03-31: qty 1

## 2014-03-31 MED ORDER — PREDNISONE 20 MG PO TABS: 60.0000 mg | ORAL_TABLET | ORAL | Status: DC

## 2014-03-31 MED ORDER — ONDANSETRON HCL 4 MG/2ML IJ SOLN: 4.0000 mg | Freq: Four times a day (QID) | INTRAMUSCULAR | Status: DC | PRN

## 2014-03-31 MED ORDER — NITROGLYCERIN 1 MG/10 ML FOR IR/CATH LAB
INTRA_ARTERIAL | Status: AC
  Filled 2014-03-31: qty 10

## 2014-03-31 MED ORDER — FENTANYL CITRATE 0.05 MG/ML IJ SOLN
INTRAMUSCULAR | Status: AC
  Filled 2014-03-31: qty 2

## 2014-03-31 MED ORDER — LIDOCAINE HCL (PF) 1 % IJ SOLN
INTRAMUSCULAR | Status: AC
  Filled 2014-03-31: qty 30

## 2014-03-31 MED ORDER — SODIUM CHLORIDE 0.9 % IV SOLN
INTRAVENOUS | Status: DC
Start: 2014-04-01 — End: 2014-03-31

## 2014-03-31 MED ORDER — VERAPAMIL HCL 2.5 MG/ML IV SOLN
INTRAVENOUS | Status: AC
  Filled 2014-03-31: qty 2

## 2014-03-31 MED ORDER — MIDAZOLAM HCL 2 MG/2ML IJ SOLN
INTRAMUSCULAR | Status: AC
  Filled 2014-03-31: qty 2

## 2014-03-31 MED ORDER — FAMOTIDINE 20 MG PO TABS: 20.0000 mg | ORAL_TABLET | ORAL | Status: DC

## 2014-03-31 MED ORDER — FAMOTIDINE IN NACL 20-0.9 MG/50ML-% IV SOLN: 20.0000 mg | Freq: Once | INTRAVENOUS | Status: DC

## 2014-03-31 MED ORDER — HEPARIN SODIUM (PORCINE) 1000 UNIT/ML IJ SOLN
INTRAMUSCULAR | Status: AC
  Filled 2014-03-31: qty 1

## 2014-03-31 NOTE — CV Procedure (Signed)
     Left Heart Catheterization with Coronary Angiography  Report  Martha Espinoza  49 y.o.  female 1965-07-30  Procedure Date: 03/31/2014 Referring Physician: Dorris Carnes, M.D.; Katha Cabal, M.D.; Julien Nordmann, M.D. Primary Cardiologist: Dorris Carnes, M.D.  INDICATIONS: 49 year old female with multiple risk factors for CAD including premature atherosclerosis (mother with CAD in her 53s) who presents with recurrent chest discomfort, a mildly abnormal myocardial perfusion study, and moderate abnormality on CT angiography of the coronaries.  PROCEDURE: 1. Left heart catheterization; 2. Coronary angiography; 3. Left ventriculography  CONSENT:  The risks, benefits, and details of the procedure were explained in detail to the patient. Risks including death, stroke, heart attack, kidney injury, allergy, limb ischemia, bleeding and radiation injury were discussed.  The patient verbalized understanding and wanted to proceed.  Informed written consent was obtained.  PROCEDURE TECHNIQUE:  After Xylocaine anesthesia a 5 French Slender sheath was placed in the right radial artery with an angiocath and the modified Seldinger technique.  Coronary angiography was done using a 5 F 5 [place] JR4, JL 3.5, and JL 3.0 cm diagnostic catheters.  Left ventriculography was done using the JR 4 catheter and hand injection.   Image review was completed. 200 g of intracoronary nitroglycerin was administered into the right coronary prior to angiography.  Hemostasis was achieved in the right radial with a wrist band at 15 cc of air   CONTRAST:  Total of 115 cc.  COMPLICATIONS:  None   HEMODYNAMICS:  Aortic pressure 139/85 mmHg; LV pressure 145/15 mmHg; LVEDP 22 mmHg  ANGIOGRAPHIC DATA:   The left main coronary artery is normal.  The left anterior descending artery is widely patent in the proximal and mid vessel. The vessel tapers in the mid to distal segment without significant obstruction. A large first  diagonal appears normal. The mid to distal LAD may have intramyocardial course..  The left circumflex artery is widely patent. Gives origin to 2 obtuse marginal branches. The first marginal arises GERD proximally, is small, and has a core second be compatible with the ramus intermedius. No significant obstruction is noted. The second marginal is large and trifurcates on the inferolateral wall. No obstruction is noted.  The right coronary artery is codominant. The vessel is normal..   LEFT VENTRICULOGRAM:  Left ventricular angiogram was done in the 30 RAO projection and revealed upper limit of normal cavity size with low normal contractility and an estimated ejection fraction of 50-55%.   IMPRESSIONS:  1. Essentially normal coronary arteries. The mid to distal LAD tapers in the mid to distal segment possibly because of an intramyocardial course. No focal obstruction is noted in any coronary territory. 2. Normal normal left ventricular systolic function with elevated end-diastolic pressure compatible with diastolic dysfunction   RECOMMENDATION:  Per treating team Home later today.

## 2014-03-31 NOTE — Discharge Instructions (Signed)
Radial Site Care °Refer to this sheet in the next few weeks. These instructions provide you with information on caring for yourself after your procedure. Your caregiver may also give you more specific instructions. Your treatment has been planned according to current medical practices, but problems sometimes occur. Call your caregiver if you have any problems or questions after your procedure. °HOME CARE INSTRUCTIONS °· You may shower the day after the procedure. Remove the bandage (dressing) and gently wash the site with plain soap and water. Gently pat the site dry. °· Do not apply powder or lotion to the site. °· Do not submerge the affected site in water for 3 to 5 days. °· Inspect the site at least twice daily. °· Do not flex or bend the affected arm for 24 hours. °· No lifting over 5 pounds (2.3 kg) for 5 days after your procedure. °· Do not drive home if you are discharged the same day of the procedure. Have someone else drive you. °· You may drive 24 hours after the procedure unless otherwise instructed by your caregiver. °· Do not operate machinery or power tools for 24 hours. °· A responsible adult should be with you for the first 24 hours after you arrive home. °What to expect: °· Any bruising will usually fade within 1 to 2 weeks. °· Blood that collects in the tissue (hematoma) may be painful to the touch. It should usually decrease in size and tenderness within 1 to 2 weeks. °SEEK IMMEDIATE MEDICAL CARE IF: °· You have unusual pain at the radial site. °· You have redness, warmth, swelling, or pain at the radial site. °· You have drainage (other than a small amount of blood on the dressing). °· You have chills. °· You have a fever or persistent symptoms for more than 72 hours. °· You have a fever and your symptoms suddenly get worse. °· Your arm becomes pale, cool, tingly, or numb. °· You have heavy bleeding from the site. Hold pressure on the site. °Document Released: 04/14/2010 Document Revised:  06/04/2011 Document Reviewed: 04/14/2010 °ExitCare® Patient Information ©2015 [employer]. This information is not intended to replace advice given to you by your health care provider. Make sure you discuss any questions you have with your health care provider. ° °

## 2014-04-02 ENCOUNTER — Ambulatory Visit (INDEPENDENT_AMBULATORY_CARE_PROVIDER_SITE_OTHER): Payer: 59 | Admitting: Family Medicine

## 2014-04-02 ENCOUNTER — Encounter: Payer: Self-pay | Admitting: Family Medicine

## 2014-04-02 VITALS — BP 139/85 | HR 70 | Temp 99.1°F | Resp 18 | Ht 62.0 in | Wt 243.0 lb

## 2014-04-02 DIAGNOSIS — E785 Hyperlipidemia, unspecified: Secondary | ICD-10-CM

## 2014-04-02 DIAGNOSIS — R072 Precordial pain: Secondary | ICD-10-CM

## 2014-04-02 DIAGNOSIS — R312 Other microscopic hematuria: Secondary | ICD-10-CM

## 2014-04-02 DIAGNOSIS — R739 Hyperglycemia, unspecified: Secondary | ICD-10-CM

## 2014-04-02 DIAGNOSIS — L299 Pruritus, unspecified: Secondary | ICD-10-CM | POA: Diagnosis not present

## 2014-04-02 DIAGNOSIS — R3129 Other microscopic hematuria: Secondary | ICD-10-CM

## 2014-04-02 DIAGNOSIS — D72829 Elevated white blood cell count, unspecified: Secondary | ICD-10-CM | POA: Diagnosis not present

## 2014-04-02 DIAGNOSIS — J018 Other acute sinusitis: Secondary | ICD-10-CM

## 2014-04-02 LAB — CBC WITH DIFFERENTIAL/PLATELET
Basophils Absolute: 0 10*3/uL (ref 0.0–0.1)
Basophils Relative: 0.3 % (ref 0.0–3.0)
Eosinophils Absolute: 0 10*3/uL (ref 0.0–0.7)
Eosinophils Relative: 0.1 % (ref 0.0–5.0)
HCT: 42.5 % (ref 36.0–46.0)
Hemoglobin: 13.7 g/dL (ref 12.0–15.0)
Lymphocytes Relative: 16 % (ref 12.0–46.0)
Lymphs Abs: 1.7 10*3/uL (ref 0.7–4.0)
MCHC: 32.3 g/dL (ref 30.0–36.0)
MCV: 86.4 fl (ref 78.0–100.0)
Monocytes Absolute: 0.1 10*3/uL (ref 0.1–1.0)
Monocytes Relative: 1.3 % — ABNORMAL LOW (ref 3.0–12.0)
Neutro Abs: 8.7 10*3/uL — ABNORMAL HIGH (ref 1.4–7.7)
Neutrophils Relative %: 82.3 % — ABNORMAL HIGH (ref 43.0–77.0)
Platelets: 314 10*3/uL (ref 150.0–400.0)
RBC: 4.91 Mil/uL (ref 3.87–5.11)
RDW: 16.5 % — ABNORMAL HIGH (ref 11.5–15.5)
WBC: 10.6 10*3/uL — ABNORMAL HIGH (ref 4.0–10.5)

## 2014-04-02 LAB — URINALYSIS, ROUTINE W REFLEX MICROSCOPIC
Bilirubin Urine: NEGATIVE
Hgb urine dipstick: NEGATIVE
Ketones, ur: NEGATIVE
Leukocytes, UA: NEGATIVE
Nitrite: NEGATIVE
Specific Gravity, Urine: >=1.03 — AB (ref 1.000–1.030)
Total Protein, Urine: NEGATIVE
Urine Glucose: NEGATIVE
Urobilinogen, UA: 0.2 (ref 0.0–1.0)
pH: 5.5 (ref 5.0–8.0)

## 2014-04-02 LAB — BASIC METABOLIC PANEL
BUN: 17 mg/dL (ref 6–23)
CO2: 24 mEq/L (ref 19–32)
Calcium: 8.9 mg/dL (ref 8.4–10.5)
Chloride: 107 mEq/L (ref 96–112)
Creatinine, Ser: 0.7 mg/dL (ref 0.4–1.2)
GFR: 97.24 mL/min (ref >60.00)
Glucose, Bld: 114 mg/dL — ABNORMAL HIGH (ref 70–99)
Potassium: 3.9 mEq/L (ref 3.5–5.1)
Sodium: 138 mEq/L (ref 135–145)

## 2014-04-02 MED ORDER — AMOXICILLIN 875 MG PO TABS: 875.0000 mg | ORAL_TABLET | Freq: Two times a day (BID) | ORAL | Status: AC

## 2014-04-02 MED ORDER — CLONAZEPAM 0.5 MG PO TABS: ORAL_TABLET | ORAL | Status: DC

## 2014-04-02 NOTE — Progress Notes (Signed)
Pre visit review using our clinic review tool, if applicable. No additional management support is needed unless otherwise documented below in the visit note. 

## 2014-04-02 NOTE — Progress Notes (Signed)
OFFICE NOTE  04/11/2014  CC:  Chief Complaint  Patient presents with  . Hospitalization Follow-up     HPI: Patient is a 49 y.o. Caucasian female who is here for hosp f/u: admitted 12/29-12/31 for chest pain, had equivocal stress test so cardiac CT angio recommended and this was abnl so cardiac cath recommended.  Cath showed no CAD, normal EF, some diastolic dysfunction. BMET and CBC were recommended at this f/u visit by her hosp MD's due to some prednisone-induced hyperglycemia and leukocytosis that needs f/u monitoring.    Her urine also showed micro hematuria so we'll repeat UA with micro today.  She does have a remote history of microhematuria she tells me today, denies hx of cystoscopy or stone w/u in the past.  She was started on a statin in hosp for hyperlipidemia.  Upon d/c from hosp, I reinitiated high dose steroid taper due to her ongoing pruritic condition of unknown etiology--of note, still no rash with the itching.  She was getting lower dose pred while in hosp.  As long as she is taking 20-40 mg qd dosing of prednisone and 25mg  benadryl q6h her itching is minimal.  She has never had a rash other than some excoriations of her skin from her own scratching.  We are at the point of asking a dermatologist for help.  We have not done a skin biopsy b/c there has been no rash to do a biopsy on.  Also reports two weeks of nasal congestion/sinus pain and fullness, green/thick mucous. Coughing some.  +Wheezing some.  She is a longtime smoker.   Pertinent PMH:   Past Medical History  Diagnosis Date  . Polycystic ovarian syndrome   . Colitis due to Clostridium difficile 2001  . Hepatic hemangioma   . Anxiety   . IBS (irritable bowel syndrome)   . History of Salmonella gastroenteritis   . GERD (gastroesophageal reflux disease)   . Rectal cancer     a. Followed by Dr. Gala Romney, dx 2000-2001. Tumor removed from rectal.  . Bipolar 1 disorder   . Migraine headache   . Asthma   .  Arthritis   . Blood transfusion without reported diagnosis   . Hypertension     a. Improved without intervention.  . Allergy   . Tobacco dependence   . Hyperlipidemia     a. Noted 02/2014.  . Morbid obesity   . Microscopic hematuria     Intermittent (no w/u done yet, as of 03/2014)   Past Surgical History  Procedure Laterality Date  . Cholecystectomy    . Colonoscopy  05/05/2007    adenoma  . Situ removed  age 6    High-grade rectal adenoma removed from rectum   . Dilation and curettage of uterus      for vaginal bleeding  . Tubal ligation    . Cardiovascular stress test  03/24/14    Lexscan MIBI: mild anterior ischemia?  Cardiac CT angiogram recommended/done.  . Transthoracic echocardiogram  03/23/14    Normal  . Left heart catheterization with coronary angiogram N/A 03/31/2014    Normal coronaries, EF 27-03%, diastolic dysfunction.  Procedure: LEFT HEART CATHETERIZATION WITH CORONARY ANGIOGRAM;  Surgeon: Sinclair Grooms, MD;  Location: [facility] CATH LAB;  Service: Cardiovascular;  Laterality: N/A;  . Coronary ct angio  03/29/14    Two vessel dz/moderate stenosis of mid LAD and proximal RCA; cath recommended.     MEDS:  Outpatient Prescriptions Prior to Visit  Medication Sig Dispense Refill  .  albuterol (VENTOLIN HFA) 108 (90 BASE) MCG/ACT inhaler Inhale 2 puffs into the lungs every 6 (six) hours as needed for wheezing or shortness of breath. 1 Inhaler 1  . aspirin 81 MG chewable tablet Chew 1 tablet (81 mg total) by mouth daily. 14 tablet 0  . atorvastatin (LIPITOR) 20 MG tablet Take 1 tablet (20 mg total) by mouth daily at 6 PM. 30 tablet 0  . diphenhydrAMINE (BENADRYL) 25 mg capsule Take 50 mg by mouth every 6 (six) hours as needed for itching or allergies.    . fexofenadine (ALLEGRA) 180 MG tablet Take 180 mg by mouth daily.    . hydrocortisone cream 1 % Apply topically 3 (three) times daily. 30 g 0  . pantoprazole (PROTONIX) 40 MG tablet Take 1 tablet (40 mg total) by mouth  daily. 30 tablet 0  . clonazePAM (KLONOPIN) 0.5 MG tablet 1-2 tabs po bid as needed for anxiety 60 tablet 1  . hydrOXYzine (ATARAX/VISTARIL) 25 MG tablet Take 1 tablet (25 mg total) by mouth every 6 (six) hours. (Patient not taking: Reported on 04/07/2014) 20 tablet 0  . predniSONE (DELTASONE) 20 MG tablet Take 3 tablets (60mg ) on Tuesday night and Wednesday Morning. 6 tablet 0  . busPIRone (BUSPAR) 10 MG tablet Take 10 mg by mouth 3 (three) times daily.    . hydrOXYzine (VISTARIL) 25 MG capsule Take 25 mg by mouth every 6 (six) hours as needed for anxiety.     . nitroGLYCERIN (NITROSTAT) 0.4 MG SL tablet Place 1 tablet (0.4 mg total) under the tongue every 5 (five) minutes as needed for chest pain. 20 tablet 0  . Oxcarbazepine (TRILEPTAL) 300 MG tablet Take 300 mg by mouth 2 (two) times daily.    . ranitidine (ZANTAC) 300 MG tablet Take 1 tablet on Tuesday night and Wednesday morning. 5 tablet 0  . rizatriptan (MAXALT) 10 MG tablet Take 1 tablet (10 mg total) by mouth as needed for migraine. May repeat in 2 hours if needed 10 tablet 0   No facility-administered medications prior to visit.    PE: Blood pressure 139/85, pulse 70, temperature 99.1 F (37.3 C), temperature source Temporal, resp. rate 18, height 5\' 2"  (1.575 m), weight 243 lb (110.224 kg), last menstrual period 03/20/2014, SpO2 97 %. VS: noted--normal. Gen: alert, NAD, NONTOXIC APPEARING. HEENT: eyes without injection, drainage, or swelling.  Ears: EACs clear, TMs with normal light reflex and landmarks.  Nose: Clear rhinorrhea, with some dried, crusty exudate adherent to mildly injected mucosa.  No purulent d/c.  No paranasal sinus TTP.  No facial swelling.  Throat and mouth without focal lesion.  No pharyngial swelling, erythema, or exudate.   Neck: supple, no LAD.   LUNGS: CTA bilat, nonlabored resps.   CV: RRR, no m/r/g. EXT: no c/c/e SKIN: no rash, just a few scratch excoriations on sacroiliac region.      LAB:     Chemistry      Component Value Date/Time   NA 138 04/02/2014 1500   K 3.9 04/02/2014 1500   CL 107 04/02/2014 1500   CO2 24 04/02/2014 1500   BUN 17 04/02/2014 1500   CREATININE 0.7 04/02/2014 1500      Component Value Date/Time   CALCIUM 8.9 04/02/2014 1500   ALKPHOS 44 03/24/2014 0602   AST 11 03/24/2014 0602   ALT 15 03/24/2014 0602   BILITOT 0.3 03/24/2014 0602     IMPRESSION/PLAN:  1) precordial CP: CAD has been ruled out.  PPI qd6-8  for possibility of reflux esophagitis as cause.  2) Pruritic condition, without rash: stable as long as she is on 20-40 mg of prednisone and benadryl q6h. Continue steroids and today I ordered referral to Dr. Nevada Crane, dermatologist in [place].  3) Hyperglycemia and leukocytosis, induced by steroids used recently for prob #2:  Recheck CBC and BMET today.  4) Acute sinusitis: amoxil 875mg  bid x 10d.  5) Hyperlipidemia: atorvastatin 20mg  qd recently started while in hosp.  Needs FLP in 2 mo to monitor.  6) Microscopic hematuria: she wants to wait on urologic referral until she feels like she is more stable from a skin standpoint and a psychological standpoint.  7) Bipolar disorder: fairly stable at this point in time, but this is a tenuous situation since her pruritic condition necessitated d/c of her lamictal, plus she has chosen to d/c her trileptal (and buspar) as well.  Hopefully pt will keep f/u with her psychiatrist, follow rec's for mood stabilizer.  An After Visit Summary was printed and given to the patient.  FOLLOW UP: 2 mo  ADDENDUM 04/02/13: her urine showed no blood on dipstick or microscopy today. Repeat urinalysis with reflex microscopy at next office f/u and if pt asymptomatic and blood shows up again will do urologic referral.  --PM

## 2014-04-05 ENCOUNTER — Telehealth: Payer: Self-pay | Admitting: Family Medicine

## 2014-04-05 MED ORDER — PREDNISONE 10 MG PO TABS: ORAL_TABLET | ORAL | Status: DC

## 2014-04-05 NOTE — Telephone Encounter (Signed)
When I contacted the patient to give her appt info for Dr. Nevada Crane she mentioned she only has 1.5 days left of pregnisone. Can she get another Rx since Dr. Nevada Crane can't see her until 1/20?

## 2014-04-05 NOTE — Telephone Encounter (Signed)
Patient states that she is beginning to itch again especially her hands for the last two days.   Please advise.

## 2014-04-05 NOTE — Telephone Encounter (Signed)
Patient aware.

## 2014-04-05 NOTE — Telephone Encounter (Signed)
Ask if she is beginning to itch again yet.  I would like to NOT keep her on such high doses of prednisone for so long.  Let me know-thx

## 2014-04-05 NOTE — Telephone Encounter (Signed)
Please advise 

## 2014-04-05 NOTE — Telephone Encounter (Signed)
In an attempt to minimize steroid exposure, I want to see how she does on 30mg  per day dosing. I have eRx'd this to her pharmacy (15 day supply): she has appt with Dr. Nevada Crane, dermatologist, in about 10 days.  Tell her that if she can get by on LESS than 30mg  prednisone per day than she should take less.-thx

## 2014-04-06 ENCOUNTER — Other Ambulatory Visit: Payer: Self-pay

## 2014-04-06 ENCOUNTER — Telehealth: Payer: Self-pay | Admitting: Interventional Cardiology

## 2014-04-06 DIAGNOSIS — M79601 Pain in right arm: Secondary | ICD-10-CM

## 2014-04-06 NOTE — Telephone Encounter (Signed)
Patient notified  Of appointment for rt upper extremity doppler at [facility]. For 04/08/2014.

## 2014-04-06 NOTE — Telephone Encounter (Signed)
We should work her into see her arm and make sure blood flow is okay. Needs to be today and tomorrow.

## 2014-04-06 NOTE — Telephone Encounter (Signed)
New problem    Pt stated she had a cath through her wrist and now she is having achy pain from wrist to shoulder. Please advise pt.

## 2014-04-06 NOTE — Telephone Encounter (Signed)
Spoke with Martha Espinoza, who recommends doppler be done No need for OV. Patient wants [place] to try to schedule this at [place].
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
## 2014-04-07 ENCOUNTER — Encounter: Payer: PRIVATE HEALTH INSURANCE | Admitting: Physician Assistant

## 2014-04-07 ENCOUNTER — Telehealth: Payer: Self-pay

## 2014-04-07 ENCOUNTER — Ambulatory Visit (INDEPENDENT_AMBULATORY_CARE_PROVIDER_SITE_OTHER): Payer: PRIVATE HEALTH INSURANCE | Admitting: Interventional Cardiology

## 2014-04-07 ENCOUNTER — Encounter: Payer: Self-pay | Admitting: Interventional Cardiology

## 2014-04-07 ENCOUNTER — Encounter: Payer: Self-pay | Admitting: Physician Assistant

## 2014-04-07 VITALS — BP 140/70 | HR 90 | Ht 62.0 in | Wt 247.0 lb

## 2014-04-07 DIAGNOSIS — G43C0 Periodic headache syndromes in child or adult, not intractable: Secondary | ICD-10-CM

## 2014-04-07 DIAGNOSIS — E785 Hyperlipidemia, unspecified: Secondary | ICD-10-CM

## 2014-04-07 DIAGNOSIS — R931 Abnormal findings on diagnostic imaging of heart and coronary circulation: Secondary | ICD-10-CM

## 2014-04-07 DIAGNOSIS — R9439 Abnormal result of other cardiovascular function study: Secondary | ICD-10-CM

## 2014-04-07 DIAGNOSIS — J449 Chronic obstructive pulmonary disease, unspecified: Secondary | ICD-10-CM

## 2014-04-07 DIAGNOSIS — R0789 Other chest pain: Secondary | ICD-10-CM

## 2014-04-07 DIAGNOSIS — R079 Chest pain, unspecified: Secondary | ICD-10-CM

## 2014-04-07 DIAGNOSIS — M79601 Pain in right arm: Secondary | ICD-10-CM

## 2014-04-07 NOTE — Progress Notes (Signed)
Patient ID: Martha Espinoza, female   DOB: 1965/10/12, 49 y.o.   MRN: [id number]    [address], [address] [place], [place]  [address] Phone: [phone] Fax:  [phone]  Date:  04/07/2014   ID:  Martha Espinoza, DOB 05/21/1965, MRN [id number]  PCP:  Tammi Sou, MD   ASSESSMENT:  1. Medial forearm and elbow discomfort following right radial catheterization on 03/31/2014. Patient had no significant discomfort at the time of cath or post-proocedure. She does have ecchymosis in the medial wrist and forearm. Suspect she had a hematoma and now has irritation/inflammation causing discomfort. 2. Heavy tobacco use 3. Recurring chest pain 4. COPD 5. Migraine headaches 6. Hyperlipidemia  PLAN:  1. Ibuprofen 400 mg with food 3 times per day 5 days  2. Notify us of progress on Monday, January 18. She should call earlier if there is concern that worsening is occurring rather than improvement. 3. At this point there is no benefit to immobilization or arm elevation   SUBJECTIVE: Martha Espinoza is a 49 y.o. female who has multiple medical problems, some of which are listed above. She is a heavy smoker. She has had recurrent chest discomfort and for that reason underwent coronary angiography one week ago. The procedure was performed from the right radial approach. She had clean coronaries. No significant complications during the procedure. There was no mention report of bleeding or discomfort during the procedure. She took Advil last evening and had significant improvement. It allowed her to sleep all night without difficulty. He is very anxious about the persistence of the discomfort and therefore we asked her to come in today to be evaluated.   Wt Readings from Last 3 Encounters:  04/07/14 247 lb (112.038 kg)  04/07/14 247 lb (112.038 kg)  04/02/14 243 lb (110.224 kg)     Past Medical History  Diagnosis Date  . Polycystic ovarian syndrome   . Colitis due to Clostridium difficile  2001  . Hepatic hemangioma   . Anxiety   . IBS (irritable bowel syndrome)   . History of Salmonella gastroenteritis   . GERD (gastroesophageal reflux disease)   . Rectal cancer     a. Followed by Dr. Gala Romney, dx 2000-2001. Tumor removed from rectal.  . Bipolar 1 disorder   . Migraine headache   . Asthma   . Arthritis   . Blood transfusion without reported diagnosis   . Hypertension     a. Improved without intervention.  . Allergy   . Tobacco dependence   . Hyperlipidemia     a. Noted 02/2014.  . Morbid obesity   . Microscopic hematuria     Intermittent (no w/u done yet, as of 03/2014)    Current Outpatient Prescriptions  Medication Sig Dispense Refill  . albuterol (VENTOLIN HFA) 108 (90 BASE) MCG/ACT inhaler Inhale 2 puffs into the lungs every 6 (six) hours as needed for wheezing or shortness of breath. 1 Inhaler 1  . amoxicillin (AMOXIL) 875 MG tablet Take 1 tablet (875 mg total) by mouth 2 (two) times daily. 20 tablet 0  . aspirin 81 MG chewable tablet Chew 1 tablet (81 mg total) by mouth daily. 14 tablet 0  . atorvastatin (LIPITOR) 20 MG tablet Take 1 tablet (20 mg total) by mouth daily at 6 PM. 30 tablet 0  . busPIRone (BUSPAR) 10 MG tablet Take 10 mg by mouth 3 (three) times daily.    . clonazePAM (KLONOPIN) 0.5 MG tablet 1-2 tabs po bid  as needed for anxiety 60 tablet 5  . diphenhydrAMINE (BENADRYL) 25 mg capsule Take 50 mg by mouth every 6 (six) hours as needed for itching or allergies.    . fexofenadine (ALLEGRA) 180 MG tablet Take 180 mg by mouth daily.    . hydrocortisone cream 1 % Apply topically 3 (three) times daily. 30 g 0  . hydrOXYzine (VISTARIL) 25 MG capsule Take 25 mg by mouth every 6 (six) hours as needed for anxiety.     . nitroGLYCERIN (NITROSTAT) 0.4 MG SL tablet Place 1 tablet (0.4 mg total) under the tongue every 5 (five) minutes as needed for chest pain. 20 tablet 0  . Oxcarbazepine (TRILEPTAL) 300 MG tablet Take 300 mg by mouth 2 (two) times daily.    .  pantoprazole (PROTONIX) 40 MG tablet Take 1 tablet (40 mg total) by mouth daily. 30 tablet 0  . predniSONE (DELTASONE) 10 MG tablet 3 tabs po qd 45 tablet 0  . ranitidine (ZANTAC) 300 MG tablet Take 1 tablet on Tuesday night and Wednesday morning. 5 tablet 0  . rizatriptan (MAXALT) 10 MG tablet Take 1 tablet (10 mg total) by mouth as needed for migraine. May repeat in 2 hours if needed 10 tablet 0  . [DISCONTINUED] citalopram (CELEXA) 40 MG tablet Take 20 mg by mouth at bedtime.     . [DISCONTINUED] omeprazole (PRILOSEC) 20 MG capsule Take 40 mg by mouth daily.    . [DISCONTINUED] promethazine (PHENERGAN) 25 MG tablet Take 25 mg by mouth every 6 (six) hours as needed. For nausea associated with migraines     No current facility-administered medications for this visit.    Allergies:    Allergies  Allergen Reactions  . Contrast Media [Iodinated Diagnostic Agents] Anaphylaxis  . Shellfish Allergy Anaphylaxis  . Codeine Nausea And Vomiting    Hallucinations, Also sees people that are not there   . Latex Hives    Social History:  The patient  reports that she has been smoking Cigarettes.  She has a 9 pack-year smoking history. She has never used smokeless tobacco. She reports that she does not drink alcohol or use illicit drugs.   ROS:  Please see the history of present illness.   Denies chills, fever, claudication, and motor abnormality. No chills or fever. No drainage from the cath site.   All other systems reviewed and negative.   OBJECTIVE: VS:  BP 140/70 mmHg  Pulse 90  Ht 5\' 2"  (1.575 m)  Wt 247 lb (112.038 kg)  BMI 45.17 kg/m2  LMP 03/20/2014 Well nourished, well developed, in no acute distress, obese and smells heavily of cigarette smoke HEENT: normal Neck: JVD flat. Carotid bruit absent  Cardiac:  normal S1, S2; RRR; no murmur Lungs:  clear to auscultation bilaterally, no wheezing, rhonchi or rales Abd: soft, nontender, no hepatomegaly  Ext: Absent right radial pulse. Cath  puncture site is without hematoma. 3+ right ulnar pulse. There is a resolving hematoma in the medial aspect of the wrist. No hematomas noted elsewhere. The arm is soft. Motor function in the right hand is normal. Hand temperature is cool but identical to the left hand. She has both left radial and ulnar pulses present. No restriction of mobility.  Skin: warm and dryHand and arm skin color normal. Neuro:  CNs 2-12 intact, no focal abnormalities noted  EKG:  Not performed       Signed, Illene Labrador III, MD 04/07/2014 11:12 AM

## 2014-04-07 NOTE — Telephone Encounter (Signed)
called to give pt Dr.Smith's recommendation. pt needs to be seen for [place] to evaluate  her cath site before any testing/studies are ordered.Marland Kitchenlmtcb

## 2014-04-07 NOTE — Telephone Encounter (Signed)
Per verbal order of Dr. Tamala Julian, patient needs to be seen today. He is here this am, so she is on flex schedule with D Dunn PA at 10:30.

## 2014-04-07 NOTE — Progress Notes (Signed)
This encounter was created in error - please disregard.

## 2014-04-07 NOTE — Patient Instructions (Signed)
Your physician recommends that you continue on your current medications as directed. Please refer to the Current Medication list given to you today.  Take 400mg  of Ibuprofen every six hour hours with food for 5 days.  Call the office on Monday 04/12/13 with an update of symptoms  Your physician discussed the hazards of tobacco use. Tobacco use cessation is recommended and techniques and options to help you quit were discussed.

## 2014-04-08 ENCOUNTER — Other Ambulatory Visit (HOSPITAL_COMMUNITY): Payer: PRIVATE HEALTH INSURANCE

## 2014-04-08 ENCOUNTER — Ambulatory Visit (HOSPITAL_COMMUNITY): Payer: PRIVATE HEALTH INSURANCE

## 2014-04-11 ENCOUNTER — Encounter: Payer: Self-pay | Admitting: Family Medicine

## 2014-04-18 ENCOUNTER — Encounter (HOSPITAL_COMMUNITY): Payer: Self-pay | Admitting: Emergency Medicine

## 2014-04-18 ENCOUNTER — Emergency Department (HOSPITAL_COMMUNITY)
Admission: EM | Admit: 2014-04-18 | Discharge: 2014-04-18 | Disposition: A | Discharge status: Home / Self Care | Payer: PRIVATE HEALTH INSURANCE | Attending: Emergency Medicine | Admitting: Emergency Medicine

## 2014-04-18 DIAGNOSIS — Z85048 Personal history of other malignant neoplasm of rectum, rectosigmoid junction, and anus: Secondary | ICD-10-CM | POA: Insufficient documentation

## 2014-04-18 DIAGNOSIS — Z720 Tobacco use: Secondary | ICD-10-CM | POA: Diagnosis not present

## 2014-04-18 DIAGNOSIS — B86 Scabies: Secondary | ICD-10-CM | POA: Insufficient documentation

## 2014-04-18 DIAGNOSIS — F419 Anxiety disorder, unspecified: Secondary | ICD-10-CM | POA: Insufficient documentation

## 2014-04-18 DIAGNOSIS — G43909 Migraine, unspecified, not intractable, without status migrainosus: Secondary | ICD-10-CM | POA: Insufficient documentation

## 2014-04-18 DIAGNOSIS — J45909 Unspecified asthma, uncomplicated: Secondary | ICD-10-CM | POA: Diagnosis not present

## 2014-04-18 DIAGNOSIS — K219 Gastro-esophageal reflux disease without esophagitis: Secondary | ICD-10-CM | POA: Diagnosis not present

## 2014-04-18 DIAGNOSIS — Z79899 Other long term (current) drug therapy: Secondary | ICD-10-CM | POA: Insufficient documentation

## 2014-04-18 DIAGNOSIS — Z7952 Long term (current) use of systemic steroids: Secondary | ICD-10-CM | POA: Insufficient documentation

## 2014-04-18 DIAGNOSIS — E785 Hyperlipidemia, unspecified: Secondary | ICD-10-CM | POA: Insufficient documentation

## 2014-04-18 DIAGNOSIS — F319 Bipolar disorder, unspecified: Secondary | ICD-10-CM | POA: Diagnosis not present

## 2014-04-18 DIAGNOSIS — Z7982 Long term (current) use of aspirin: Secondary | ICD-10-CM | POA: Insufficient documentation

## 2014-04-18 DIAGNOSIS — I10 Essential (primary) hypertension: Secondary | ICD-10-CM | POA: Insufficient documentation

## 2014-04-18 DIAGNOSIS — M1990 Unspecified osteoarthritis, unspecified site: Secondary | ICD-10-CM | POA: Insufficient documentation

## 2014-04-18 DIAGNOSIS — R21 Rash and other nonspecific skin eruption: Secondary | ICD-10-CM | POA: Diagnosis present

## 2014-04-18 DIAGNOSIS — Z91040 Latex allergy status: Secondary | ICD-10-CM | POA: Diagnosis not present

## 2014-04-18 MED ORDER — HYDROXYZINE HCL 25 MG PO TABS
25.0000 mg | ORAL_TABLET | Freq: Once | ORAL | Status: AC
  Administered 2014-04-18: 25 mg via ORAL
  Filled 2014-04-18: qty 1

## 2014-04-18 MED ORDER — LINDANE 1 % EX LOTN
1.0000 | TOPICAL_LOTION | Freq: Once | CUTANEOUS | Status: DC
Start: 2014-04-18 — End: 2014-06-27

## 2014-04-18 NOTE — ED Notes (Addendum)
Patient seen here on Wednesday and diagnosed with scabies. Patient now reports rash on arms bilateral, chest, legs and abd after using cream to treat scabies.  Per patient had blisters on palms of hands and between fingers this morning but that has disappeared. Per patient rash is burning and itching. Patient reports that she felt a burning sensation when she applied the cream. Denies any difficulty breathing or swallowing. Per patient reports taking benadryl with last dose 1 hour ago.

## 2014-04-18 NOTE — Discharge Instructions (Signed)
Lindane as prescribed.  Continue your hydroxyzine as needed for itching.  Follow-up with your dermatologist this week if not improving in the next 2 days.   Scabies Scabies are small bugs (mites) that burrow under the skin and cause red bumps and severe itching. These bugs can only be seen with a microscope. Scabies are highly contagious. They can spread easily from person to person by direct contact. They are also spread through sharing clothing or linens that have the scabies mites living in them. It is not unusual for an entire family to become infected through shared towels, clothing, or bedding.  HOME CARE INSTRUCTIONS   Your caregiver may prescribe a cream or lotion to kill the mites. If cream is prescribed, massage the cream into the entire body from the neck to the bottom of both feet. Also massage the cream into the scalp and face if your child is less than 85 year old. Avoid the eyes and mouth. Do not wash your hands after application.  Leave the cream on for 8 to 12 hours. Your child should bathe or shower after the 8 to 12 hour application period. Sometimes it is helpful to apply the cream to your child right before bedtime.  One treatment is usually effective and will eliminate approximately 95% of infestations. For severe cases, your caregiver may decide to repeat the treatment in 1 week. Everyone in your household should be treated with one application of the cream.  New rashes or burrows should not appear within 24 to 48 hours after successful treatment. However, the itching and rash may last for 2 to 4 weeks after successful treatment. Your caregiver may prescribe a medicine to help with the itching or to help the rash go away more quickly.  Scabies can live on clothing or linens for up to 3 days. All of your child's recently used clothing, towels, stuffed toys, and bed linens should be washed in hot water and then dried in a dryer for at least 20 minutes on high heat. Items that  cannot be washed should be enclosed in a plastic bag for at least 3 days.  To help relieve itching, bathe your child in a cool bath or apply cool washcloths to the affected areas.  Your child may return to school after treatment with the prescribed cream. SEEK MEDICAL CARE IF:   The itching persists longer than 4 weeks after treatment.  The rash spreads or becomes infected. Signs of infection include red blisters or yellow-tan crust. Document Released: 03/12/2005 Document Revised: 06/04/2011 Document Reviewed: 07/21/2008 [facility] Patient Information 2015 [place], [place]. This information is not intended to replace advice given to you by your health care provider. Make sure you discuss any questions you have with your health care provider.

## 2014-04-18 NOTE — ED Provider Notes (Signed)
CSN: [id number]     Arrival date & time 04/18/14  1514 History  This chart was scribed for Veryl Speak, MD by Edison Simon, ED Scribe. This patient was seen in room APA10/APA10 and the patient's care was started at 3:41 PM.    Chief Complaint  Patient presents with  . Allergic Reaction   Patient is a 49 y.o. female presenting with allergic reaction. The history is provided by the patient. No language interpreter was used.  Allergic Reaction Presenting symptoms: itching and rash   Itching:    Location:  Arm, chest, abdomen and leg   Severity:  Moderate   Onset quality:  Gradual   Duration:  1 day   Timing:  Constant   Progression:  Worsening Rash:    Location:  Abdomen, chest and arm   Quality: blistering and itchiness     Severity:  Moderate   Onset quality:  Gradual   Duration:  2 days   Timing:  Constant   Progression:  Worsening Severity:  Moderate Prior allergic episodes:  Food/nut allergies Context: medications   Relieved by:  Nothing Worsened by:  Nothing tried Ineffective treatments:  Antihistamines and steroids   HPI Comments: Martha Espinoza is a 49 y.o. female who presents to the Emergency Department complaining of allergic reaction symptoms with onset 2 days ago with diffuse blistering and itching rash that began this morning. She states it feels similar to prior allergic reaction to shellfish. She notes she has been having itching since November which was diagnosed as scabies 4 days ago by a dermatologist and prescribed Permethrin. She believes she is having a allergic reaction to Permethrin. She states Permethrin seemed to improve her previous symptoms. She states she used Benadryl without improvement. She states she has been using Prednisone for the past 7 weeks and uses Hydroxyzine for psychiatric disorder.  Past Medical History  Diagnosis Date  . Polycystic ovarian syndrome   . Colitis due to Clostridium difficile 2001  . Hepatic hemangioma   . Anxiety   . IBS  (irritable bowel syndrome)   . History of Salmonella gastroenteritis   . GERD (gastroesophageal reflux disease)   . Rectal cancer     a. Followed by Dr. Gala Romney, dx 2000-2001. Tumor removed from rectal.  . Bipolar 1 disorder   . Migraine headache   . Asthma   . Arthritis   . Blood transfusion without reported diagnosis   . Hypertension     a. Improved without intervention.  . Allergy   . Tobacco dependence   . Hyperlipidemia     a. Noted 02/2014.  . Morbid obesity   . Microscopic hematuria     Intermittent (no w/u done yet, as of 03/2014)   Past Surgical History  Procedure Laterality Date  . Cholecystectomy    . Colonoscopy  05/05/2007    adenoma  . Situ removed  age 37    High-grade rectal adenoma removed from rectum   . Dilation and curettage of uterus      for vaginal bleeding  . Tubal ligation    . Cardiovascular stress test  03/24/14    Lexscan MIBI: mild anterior ischemia?  Cardiac CT angiogram recommended/done.  . Transthoracic echocardiogram  03/23/14    Normal  . Left heart catheterization with coronary angiogram N/A 03/31/2014    Normal coronaries, EF 72-09%, diastolic dysfunction.  Procedure: LEFT HEART CATHETERIZATION WITH CORONARY ANGIOGRAM;  Surgeon: Sinclair Grooms, MD;  Location: [facility] CATH LAB;  Service: Cardiovascular;  Laterality: N/A;  . Coronary ct angio  03/29/14    Two vessel dz/moderate stenosis of mid LAD and proximal RCA; cath recommended.   Family History  Problem Relation Age of Onset  . Colon cancer Father 5  . Cancer Father   . Diverticulitis Mother   . Heart disease Mother   . Hypertension Mother   . Hyperlipidemia Mother   . Mental illness Mother   . Diabetes Mother   . Heart attack Mother   . Stroke Neg Hx    History  Substance Use Topics  . Smoking status: Current Every Day Smoker -- 0.50 packs/day for 18 years    Types: Cigarettes  . Smokeless tobacco: Never Used     Comment: 25 years  . Alcohol Use: No   OB History    Gravida  Para Term Preterm AB TAB SAB Ectopic Multiple Living   4 1 1  3  3   1      Review of Systems  Skin: Positive for itching and rash.   A complete 10 system review of systems was obtained and all systems are negative except as noted in the HPI and PMH.    Allergies  Contrast media; Shellfish allergy; Codeine; and Latex  Home Medications   Prior to Admission medications   Medication Sig Start Date End Date Taking? Authorizing Provider  albuterol (VENTOLIN HFA) 108 (90 BASE) MCG/ACT inhaler Inhale 2 puffs into the lungs every 6 (six) hours as needed for wheezing or shortness of breath. 12/02/13   Tammi Sou, MD  aspirin 81 MG chewable tablet Chew 1 tablet (81 mg total) by mouth daily. 09/06/12   Sharyon Cable, MD  atorvastatin (LIPITOR) 20 MG tablet Take 1 tablet (20 mg total) by mouth daily at 6 PM. 03/25/14   Eugenie Filler, MD  busPIRone (BUSPAR) 10 MG tablet Take 10 mg by mouth 3 (three) times daily.    Historical Provider, MD  clonazePAM (KLONOPIN) 0.5 MG tablet 1-2 tabs po bid as needed for anxiety 04/02/14   Tammi Sou, MD  diphenhydrAMINE (BENADRYL) 25 mg capsule Take 50 mg by mouth every 6 (six) hours as needed for itching or allergies.    Historical Provider, MD  fexofenadine (ALLEGRA) 180 MG tablet Take 180 mg by mouth daily.    Historical Provider, MD  hydrocortisone cream 1 % Apply topically 3 (three) times daily. 03/25/14   Eugenie Filler, MD  hydrOXYzine (VISTARIL) 25 MG capsule Take 25 mg by mouth every 6 (six) hours as needed for anxiety.     Historical Provider, MD  nitroGLYCERIN (NITROSTAT) 0.4 MG SL tablet Place 1 tablet (0.4 mg total) under the tongue every 5 (five) minutes as needed for chest pain. 03/25/14   Eugenie Filler, MD  Oxcarbazepine (TRILEPTAL) 300 MG tablet Take 300 mg by mouth 2 (two) times daily.    Historical Provider, MD  pantoprazole (PROTONIX) 40 MG tablet Take 1 tablet (40 mg total) by mouth daily. 03/25/14   Eugenie Filler, MD   predniSONE (DELTASONE) 10 MG tablet 3 tabs po qd 04/05/14   Tammi Sou, MD  ranitidine (ZANTAC) 300 MG tablet Take 1 tablet on Tuesday night and Wednesday morning. 03/30/14   Fay Records, MD  rizatriptan (MAXALT) 10 MG tablet Take 1 tablet (10 mg total) by mouth as needed for migraine. May repeat in 2 hours if needed 01/15/14   Irene Pap, NP   BP 144/78 mmHg  Pulse 100  Temp(Src) 98.1 F (36.7 C) (Oral)  Resp 19  Ht 5' (1.524 m)  Wt 251 lb (113.853 kg)  BMI 49.02 kg/m2  SpO2 99%  LMP 03/20/2014 Physical Exam  Constitutional: She is oriented to person, place, and time. She appears well-developed and well-nourished.  HENT:  Head: Normocephalic and atraumatic.  Mouth/Throat: Oropharynx is clear and moist.  Eyes: Conjunctivae are normal.  Neck: Normal range of motion. Neck supple.  Pulmonary/Chest: Effort normal.  Musculoskeletal: Normal range of motion.  Neurological: She is alert and oriented to person, place, and time.  Skin: Skin is warm and dry.  Multiple excoriations to both arms, chest, back. There are no urticarial lesions. There are lesions noted between the fingers.  Psychiatric: She has a normal mood and affect.  Nursing note and vitals reviewed.   ED Course  Procedures (including critical care time)  DIAGNOSTIC STUDIES: Oxygen Saturation is 99% on room air, normal by my interpretation.    COORDINATION OF CARE: 3:48 PM Discussed treatment plan with patient at beside, the patient agrees with the plan and has no further questions at this time.   Labs Review Labs Reviewed - No data to display  Imaging Review No results found.   EKG Interpretation None      MDM   Final diagnoses:  None    Patient presents with complaints of rash and itching that she believes is a reaction to the medications she was given for scabies. She has been using permethrin cream in several days into therapy broke out into this rash. She stopped taking it several days ago  and is now having increased itching. I will change this to lindane and have her continue taking hydroxyzine as needed.  I personally performed the services described in this documentation, which was scribed in my presence. The recorded information has been reviewed and is accurate.      Veryl Speak, MD 04/18/14 [phone]

## 2014-04-29 ENCOUNTER — Other Ambulatory Visit: Payer: Self-pay | Admitting: Family Medicine

## 2014-04-29 MED ORDER — PANTOPRAZOLE SODIUM 40 MG PO TBEC: 40.0000 mg | DELAYED_RELEASE_TABLET | Freq: Every day | ORAL | Status: DC

## 2014-05-25 DIAGNOSIS — D509 Iron deficiency anemia, unspecified: Secondary | ICD-10-CM

## 2014-05-25 HISTORY — DX: Iron deficiency anemia, unspecified: D50.9

## 2014-06-22 ENCOUNTER — Encounter: Payer: Self-pay | Admitting: Family Medicine

## 2014-06-22 ENCOUNTER — Ambulatory Visit (INDEPENDENT_AMBULATORY_CARE_PROVIDER_SITE_OTHER): Payer: 59 | Admitting: Family Medicine

## 2014-06-22 VITALS — BP 125/84 | HR 98 | Temp 98.7°F | Ht 60.0 in | Wt 236.0 lb

## 2014-06-22 DIAGNOSIS — K900 Celiac disease: Secondary | ICD-10-CM | POA: Diagnosis not present

## 2014-06-22 DIAGNOSIS — J069 Acute upper respiratory infection, unspecified: Secondary | ICD-10-CM | POA: Diagnosis not present

## 2014-06-22 DIAGNOSIS — K9041 Non-celiac gluten sensitivity: Secondary | ICD-10-CM

## 2014-06-22 DIAGNOSIS — L309 Dermatitis, unspecified: Secondary | ICD-10-CM | POA: Diagnosis not present

## 2014-06-22 LAB — IBC PANEL
Iron: 42 ug/dL (ref 42–145)
Saturation Ratios: 9.7 % — ABNORMAL LOW (ref 20.0–50.0)
Transferrin: 310 mg/dL (ref 212.0–360.0)

## 2014-06-22 LAB — COMPREHENSIVE METABOLIC PANEL
ALT: 9 U/L (ref 0–35)
AST: 8 U/L (ref 0–37)
Albumin: 4 g/dL (ref 3.5–5.2)
Alkaline Phosphatase: 56 U/L (ref 39–117)
BUN: 11 mg/dL (ref 6–23)
CO2: 28 mEq/L (ref 19–32)
Calcium: 9.4 mg/dL (ref 8.4–10.5)
Chloride: 106 mEq/L (ref 96–112)
Creatinine, Ser: 0.71 mg/dL (ref 0.40–1.20)
GFR: 94 mL/min (ref >60.00)
Glucose, Bld: 95 mg/dL (ref 70–99)
Potassium: 3.8 mEq/L (ref 3.5–5.1)
Sodium: 140 mEq/L (ref 135–145)
Total Bilirubin: 0.3 mg/dL (ref 0.2–1.2)
Total Protein: 6.5 g/dL (ref 6.0–8.3)

## 2014-06-22 LAB — IGA: IgA: 121 mg/dL (ref 68–378)

## 2014-06-22 LAB — FERRITIN: Ferritin: 10.4 ng/mL (ref 10.0–291.0)

## 2014-06-22 LAB — PROTIME-INR
INR: 0.9 ratio (ref 0.8–1.0)
Prothrombin Time: 10.2 s (ref 9.6–13.1)

## 2014-06-22 LAB — VITAMIN D 25 HYDROXY (VIT D DEFICIENCY, FRACTURES): VITD: 8.85 ng/mL — ABNORMAL LOW (ref 30.00–100.00)

## 2014-06-22 NOTE — Progress Notes (Signed)
OFFICE VISIT  06/27/2014   CC:  Chief Complaint  Patient presents with  . Pruritis  . Sinus Problem    x2 days  . Medication Refill    prednisone(pended)   HPI:    Patient is a 49 y.o. Caucasian female who presents for ongoing problem with itchy skin, which now has developed a very subtle papular rash. Saw Dr. Nevada Crane, Dermatologist in [place], says she didn't get properly evaluated but the dx of scabies was arrived at very quickly.  She was rx'd permethrin cream and says that she applied this and shortly after she began to have a "allergic reaction" to it, went to ED where "lesions" were noted between fingers and multiple excoriations noted on both arms.  No urticarial lesions.  No comment on anything like hives or bullae. She is a difficult historian in that she perseverates on her itching symptom and never directly answers questions directly or with any helpful diagnostic information.  Denies fevers, denies joint aches, redness, or swelling.  In the past, prednisone has helped for this itching but she has never really had any skin changes convincing for a rash.  She now points out that she has tiny papulopustules on hands, feet, and right wrist that seem to hurt as well as itch.   She says she has not taken any prednisone since I last rx'd a taper for her 04/02/14--right before i sent her to the dermatologist. She does have hx of steroid-induced hyperglycemia and leukocytosis. She denies any connection between worsened itching/onset of rash with ingestion of any particular food or food ingredient/type. When asked specifically about gluten she says she has no abd sx's that she definitely connects with ingestion of this.  She does have long hx of IBS, has not followed up with her GI MD appropriately per her report today.  Also with hx of bipolar disorder and anxiety, has stopped seeing her psychiatrist for unclear reasons, asks if I will assume care for her bipolar d/o today and i declined.  Her  lamictal was d/c'd in the midst of her itching problem and she was put on oxcarbazapine but she never took this--she was mistrustful of it for some reason.  She c/o 2 d hx of sneezing, nasal congestion, "sinus fullness/pressure".  No cough but some mild chest tightness last night prompted her to use her albuterol inhaler and this helped.  She never takes symptomatic meds for this type of illness with any regularity.  No fever, no pain in face, no pain in upper teeth.  No ST.  Mild peri-orbital HA.   Past Medical History  Diagnosis Date  . Polycystic ovarian syndrome   . Colitis due to Clostridium difficile 2001  . Hepatic hemangioma   . Anxiety   . IBS (irritable bowel syndrome)   . History of Salmonella gastroenteritis   . GERD (gastroesophageal reflux disease)   . Rectal cancer     a. Followed by Dr. Gala Romney, dx 2000-2001. Tumor removed from rectal.  . Bipolar 1 disorder   . Migraine headache   . Asthma   . Arthritis   . Blood transfusion without reported diagnosis   . Hypertension     a. Improved without intervention.  . Allergy   . Tobacco dependence   . Hyperlipidemia     a. Noted 02/2014.  . Morbid obesity   . Microscopic hematuria     Intermittent (no w/u done yet, as of 03/2014)    Past Surgical History  Procedure Laterality Date  .  Cholecystectomy    . Colonoscopy  05/05/2007    adenoma  . Situ removed  age 61    High-grade rectal adenoma removed from rectum   . Dilation and curettage of uterus      for vaginal bleeding  . Tubal ligation    . Cardiovascular stress test  03/24/14    Lexscan MIBI: mild anterior ischemia?  Cardiac CT angiogram recommended/done.  . Transthoracic echocardiogram  03/23/14    Normal  . Left heart catheterization with coronary angiogram N/A 03/31/2014    Normal coronaries, EF 66-59%, diastolic dysfunction.  Procedure: LEFT HEART CATHETERIZATION WITH CORONARY ANGIOGRAM;  Surgeon: Sinclair Grooms, MD;  Location: [facility] CATH LAB;  Service:  Cardiovascular;  Laterality: N/A;  . Coronary ct angio  03/29/14    Two vessel dz/moderate stenosis of mid LAD and proximal RCA; cath recommended.    Outpatient Prescriptions Prior to Visit  Medication Sig Dispense Refill  . albuterol (VENTOLIN HFA) 108 (90 BASE) MCG/ACT inhaler Inhale 2 puffs into the lungs every 6 (six) hours as needed for wheezing or shortness of breath. 1 Inhaler 1  . aspirin 81 MG chewable tablet Chew 1 tablet (81 mg total) by mouth daily. 14 tablet 0  . busPIRone (BUSPAR) 10 MG tablet Take 10 mg by mouth 3 (three) times daily.    . clonazePAM (KLONOPIN) 0.5 MG tablet 1-2 tabs po bid as needed for anxiety (Patient taking differently: Take 1 mg by mouth at bedtime. Prescribed1-2 tabs po bid as needed for anxiety) 60 tablet 5  . diphenhydrAMINE (BENADRYL) 25 mg capsule Take 50 mg by mouth every 6 (six) hours as needed for itching or allergies.    . fexofenadine (ALLEGRA) 180 MG tablet Take 180 mg by mouth daily.    . nitroGLYCERIN (NITROSTAT) 0.4 MG SL tablet Place 1 tablet (0.4 mg total) under the tongue every 5 (five) minutes as needed for chest pain. 20 tablet 0  . pantoprazole (PROTONIX) 40 MG tablet Take 1 tablet (40 mg total) by mouth daily. 30 tablet 3  . predniSONE (DELTASONE) 10 MG tablet 3 tabs po qd (Patient taking differently: Take 30 mg by mouth daily. 3 tabs po qd) 45 tablet 0  . hydrocortisone cream 1 % Apply topically 3 (three) times daily. (Patient not taking: Reported on 04/18/2014) 30 g 0  . lindane lotion (KWELL) 1 % Apply 1 application topically once. (Patient not taking: Reported on 06/22/2014) 30 mL 1  . atorvastatin (LIPITOR) 20 MG tablet Take 1 tablet (20 mg total) by mouth daily at 6 PM. (Patient not taking: Reported on 04/18/2014) 30 tablet 0  . hydrOXYzine (VISTARIL) 25 MG capsule Take 25 mg by mouth every 6 (six) hours as needed for anxiety.     . Oxcarbazepine (TRILEPTAL) 300 MG tablet Take 300 mg by mouth 2 (two) times daily.    . permethrin  (ELIMITE) 5 % cream Apply 1 application topically once.    . ranitidine (ZANTAC) 300 MG tablet Take 1 tablet on Tuesday night and Wednesday morning. (Patient not taking: Reported on 04/18/2014) 5 tablet 0  . rizatriptan (MAXALT) 10 MG tablet Take 1 tablet (10 mg total) by mouth as needed for migraine. May repeat in 2 hours if needed (Patient not taking: Reported on 04/18/2014) 10 tablet 0   No facility-administered medications prior to visit.    Allergies  Allergen Reactions  . Contrast Media [Iodinated Diagnostic Agents] Anaphylaxis  . Shellfish Allergy Anaphylaxis  . Codeine Nausea And Vomiting  Hallucinations, Also sees people that are not there   . Latex Hives  . Lipitor [Atorvastatin] Itching  . Permethrin Itching    ROS As per HPI  PE: Blood pressure 125/84, pulse 98, temperature 98.7 F (37.1 C), temperature source Temporal, height 5' (1.524 m), weight 236 lb (107.049 kg), last menstrual period 05/31/2014, SpO2 97 %. VS: noted--normal. Gen: alert, NAD, NONTOXIC APPEARING. HEENT: eyes without injection, drainage, or swelling.  Ears: EACs clear, TMs with normal light reflex and landmarks.  Nose: Clear rhinorrhea, with some dried, crusty exudate adherent to mildly injected mucosa.  No purulent d/c.  No paranasal sinus TTP.  No facial swelling.  Throat and mouth without focal lesion.  No pharyngial swelling, erythema, or exudate.   Neck: supple, no LAD.   LUNGS: CTA bilat, nonlabored resps.   CV: RRR, no m/r/g. EXT: no c/c/e SKIN: no rash except for a few tiny peach colored (barely palpable) papulovesicles on hands, feet, and right wrist.  No hives, no bullae, no petechiae, no ulcerations.  LABS:  Lab Results  Component Value Date   HGBA1C 5.9* 03/23/2014     IMPRESSION AND PLAN:  1) Itching, ? Rash:  Onset of problem was about 5 mo ago. No steroids at this time b/c risks >benefits plus I do not know what I am treating.  Continue antihistamines. Fo the possibility  that this may be dermatitis herpetiformis, will have her start a gluten free diet for at least 2 wks and see how things go.  If not change, will either biopsy (if rash is present to get a bx of) or refer to a different dermatologist for a 2nd opinion.  2) IBS, itchy rash with ? Of dermatitis herpetiformis. Gluten free diet info given today, also will check IgA and TTG IgA today as well as iron and vit D levels and PT/INR.  She was encouraged to f/u with her GI but I am skeptical about her plans to do this.  3) URI, suspect viral.   Trial of mucinex DM or robitussin DM otc as directed on the box. May use OTC nasal saline spray or irrigation solution bid. OTC nonsedating antihistamines prn discussed.  Decongestant use discussed--ok if tolerated in the past w/out side effect and if pt has no hx of HTN.  4) Bipolar disorder; strongly encouraged pt to re-establish with psychiatric provider in [place] so she could get back on med treatment.  Continue clonazepam and buspar for anxiety.   An After Visit Summary was printed and given to the patient.  FOLLOW UP: Return in about 2 weeks (around 07/06/2014) for f/u skin.

## 2014-06-22 NOTE — Progress Notes (Signed)
Pre visit review using our clinic review tool, if applicable. No additional management support is needed unless otherwise documented below in the visit note. 

## 2014-06-25 LAB — GLIADIN IGA+TTG IGA: Transglutaminase IgA: <2 U/mL (ref 0–3)

## 2014-06-28 ENCOUNTER — Encounter (HOSPITAL_COMMUNITY): Payer: Self-pay | Admitting: Emergency Medicine

## 2014-06-28 ENCOUNTER — Emergency Department (HOSPITAL_COMMUNITY)
Admission: EM | Admit: 2014-06-28 | Discharge: 2014-06-28 | Disposition: A | Discharge status: Home / Self Care | Payer: 59 | Attending: Emergency Medicine | Admitting: Emergency Medicine

## 2014-06-28 DIAGNOSIS — M1990 Unspecified osteoarthritis, unspecified site: Secondary | ICD-10-CM | POA: Insufficient documentation

## 2014-06-28 DIAGNOSIS — K219 Gastro-esophageal reflux disease without esophagitis: Secondary | ICD-10-CM | POA: Diagnosis not present

## 2014-06-28 DIAGNOSIS — Z8619 Personal history of other infectious and parasitic diseases: Secondary | ICD-10-CM | POA: Insufficient documentation

## 2014-06-28 DIAGNOSIS — F419 Anxiety disorder, unspecified: Secondary | ICD-10-CM | POA: Diagnosis not present

## 2014-06-28 DIAGNOSIS — Z79899 Other long term (current) drug therapy: Secondary | ICD-10-CM | POA: Insufficient documentation

## 2014-06-28 DIAGNOSIS — K0889 Other specified disorders of teeth and supporting structures: Secondary | ICD-10-CM

## 2014-06-28 DIAGNOSIS — Z8701 Personal history of pneumonia (recurrent): Secondary | ICD-10-CM | POA: Insufficient documentation

## 2014-06-28 DIAGNOSIS — Z7982 Long term (current) use of aspirin: Secondary | ICD-10-CM | POA: Insufficient documentation

## 2014-06-28 DIAGNOSIS — J45909 Unspecified asthma, uncomplicated: Secondary | ICD-10-CM | POA: Insufficient documentation

## 2014-06-28 DIAGNOSIS — K088 Other specified disorders of teeth and supporting structures: Secondary | ICD-10-CM | POA: Insufficient documentation

## 2014-06-28 DIAGNOSIS — Z91040 Latex allergy status: Secondary | ICD-10-CM | POA: Diagnosis not present

## 2014-06-28 DIAGNOSIS — Z720 Tobacco use: Secondary | ICD-10-CM | POA: Diagnosis not present

## 2014-06-28 DIAGNOSIS — Z85048 Personal history of other malignant neoplasm of rectum, rectosigmoid junction, and anus: Secondary | ICD-10-CM | POA: Diagnosis not present

## 2014-06-28 MED ORDER — PROMETHAZINE HCL 12.5 MG PO TABS
12.5000 mg | ORAL_TABLET | Freq: Once | ORAL | Status: AC
  Administered 2014-06-28: 12.5 mg via ORAL
  Filled 2014-06-28: qty 1

## 2014-06-28 MED ORDER — IBUPROFEN 800 MG PO TABS: 800.0000 mg | ORAL_TABLET | Freq: Three times a day (TID) | ORAL | Status: DC

## 2014-06-28 MED ORDER — AMOXICILLIN 250 MG PO CAPS
500.0000 mg | ORAL_CAPSULE | Freq: Once | ORAL | Status: AC
  Administered 2014-06-28: 500 mg via ORAL
  Filled 2014-06-28: qty 2

## 2014-06-28 MED ORDER — AMOXICILLIN 500 MG PO CAPS: 500.0000 mg | ORAL_CAPSULE | Freq: Three times a day (TID) | ORAL | Status: DC

## 2014-06-28 MED ORDER — HYDROCODONE-ACETAMINOPHEN 5-325 MG PO TABS
2.0000 | ORAL_TABLET | Freq: Once | ORAL | Status: AC
  Administered 2014-06-28: 2 via ORAL
  Filled 2014-06-28: qty 2

## 2014-06-28 NOTE — ED Provider Notes (Signed)
CSN: [id number]     Arrival date & time 06/28/14  1651 History  This chart was scribed for Lily Kocher, PA-C with Fredia Sorrow, MD by Edison Simon, ED Scribe. This patient was seen in room APFT20/APFT20 and the patient's care was started at 5:06 PM.    Chief Complaint  Patient presents with  . Dental Pain   Patient is a 49 y.o. female presenting with tooth pain. The history is provided by the patient. No language interpreter was used.  Dental Pain Location:  Upper Upper teeth location: left. Severity:  Moderate Onset quality:  Sudden Duration:  1 day Timing:  Constant Progression:  Worsening Chronicity:  New Context: not abscess   Relieved by:  None tried Worsened by:  Nothing tried Ineffective treatments:  NSAIDs Associated symptoms: no fever   Risk factors: no immunosuppression    HPI Comments: Martha Espinoza is a 49 y.o. female who presents to the Emergency Department complaining of left upper dental pain with onset last night, worsening progressively. She states she also has pain to throat and eye. She states she has used 1000mg  Ibuprofen without improvement. She denies history of diabetes, recent cancer treatment, or other immunosuppression. She had cancer that has been in remission of years. She states she has celiac disease. She denies fever.   PCP: Tammi Sou, MD   Past Medical History  Diagnosis Date  . Polycystic ovarian syndrome   . Colitis due to Clostridium difficile 2001  . Hepatic hemangioma   . Anxiety   . IBS (irritable bowel syndrome)   . History of Salmonella gastroenteritis   . GERD (gastroesophageal reflux disease)   . Rectal cancer     a. Followed by Dr. Gala Romney, dx 2000-2001. Tumor removed from rectal.  . Bipolar 1 disorder   . Migraine headache   . Asthma   . Arthritis   . Blood transfusion without reported diagnosis   . Hypertension     a. Improved without intervention.  . Allergy   . Tobacco dependence   . Hyperlipidemia     a. Noted  02/2014.  . Morbid obesity   . Microscopic hematuria     Intermittent (no w/u done yet, as of 03/2014)   Past Surgical History  Procedure Laterality Date  . Cholecystectomy    . Colonoscopy  05/05/2007    adenoma  . Situ removed  age 89    High-grade rectal adenoma removed from rectum   . Dilation and curettage of uterus      for vaginal bleeding  . Tubal ligation    . Cardiovascular stress test  03/24/14    Lexscan MIBI: mild anterior ischemia?  Cardiac CT angiogram recommended/done.  . Transthoracic echocardiogram  03/23/14    Normal  . Left heart catheterization with coronary angiogram N/A 03/31/2014    Normal coronaries, EF 67-61%, diastolic dysfunction.  Procedure: LEFT HEART CATHETERIZATION WITH CORONARY ANGIOGRAM;  Surgeon: Sinclair Grooms, MD;  Location: [facility] CATH LAB;  Service: Cardiovascular;  Laterality: N/A;  . Coronary ct angio  03/29/14    Two vessel dz/moderate stenosis of mid LAD and proximal RCA; cath recommended.   Family History  Problem Relation Age of Onset  . Colon cancer Father 55  . Cancer Father   . Diverticulitis Mother   . Heart disease Mother   . Hypertension Mother   . Hyperlipidemia Mother   . Mental illness Mother   . Diabetes Mother   . Heart attack Mother   . Stroke  Neg Hx    History  Substance Use Topics  . Smoking status: Current Every Day Smoker -- 0.50 packs/day for 18 years    Types: Cigarettes  . Smokeless tobacco: Never Used     Comment: 25 years  . Alcohol Use: No   OB History    Gravida Para Term Preterm AB TAB SAB Ectopic Multiple Living   4 1 1  3  3   1      Review of Systems  Constitutional: Negative for fever.  HENT: Positive for dental problem and sore throat.   Eyes: Positive for pain.  Allergic/Immunologic: Negative for immunocompromised state.  All other systems reviewed and are negative.     Allergies  Contrast media; Shellfish allergy; Codeine; Latex; Lipitor; and Permethrin  Home Medications   Prior to  Admission medications   Medication Sig Start Date End Date Taking? Authorizing Provider  albuterol (VENTOLIN HFA) 108 (90 BASE) MCG/ACT inhaler Inhale 2 puffs into the lungs every 6 (six) hours as needed for wheezing or shortness of breath. 12/02/13   Tammi Sou, MD  aspirin 81 MG chewable tablet Chew 1 tablet (81 mg total) by mouth daily. 09/06/12   Ripley Fraise, MD  busPIRone (BUSPAR) 10 MG tablet Take 10 mg by mouth 3 (three) times daily.    Historical Provider, MD  clonazePAM (KLONOPIN) 0.5 MG tablet 1-2 tabs po bid as needed for anxiety Patient taking differently: Take 1 mg by mouth at bedtime. Prescribed1-2 tabs po bid as needed for anxiety 04/02/14   Tammi Sou, MD  diphenhydrAMINE (BENADRYL) 25 mg capsule Take 50 mg by mouth every 6 (six) hours as needed for itching or allergies.    Historical Provider, MD  fexofenadine (ALLEGRA) 180 MG tablet Take 180 mg by mouth daily.    Historical Provider, MD  hydrocortisone cream 1 % Apply topically 3 (three) times daily. Patient not taking: Reported on 04/18/2014 03/25/14   Eugenie Filler, MD  nitroGLYCERIN (NITROSTAT) 0.4 MG SL tablet Place 1 tablet (0.4 mg total) under the tongue every 5 (five) minutes as needed for chest pain. 03/25/14   Eugenie Filler, MD  pantoprazole (PROTONIX) 40 MG tablet Take 1 tablet (40 mg total) by mouth daily. 04/29/14   Tammi Sou, MD   BP 134/82 mmHg  Pulse 98  Temp(Src) 98.1 F (36.7 C) (Oral)  Resp 18  Ht 5' (1.524 m)  Wt 232 lb (105.235 kg)  BMI 45.31 kg/m2  SpO2 99%  LMP 06/04/2014 Physical Exam  Constitutional: She is oriented to person, place, and time. She appears well-developed and well-nourished.  HENT:  Head: Normocephalic and atraumatic.  No facial assymmetry No swelling under the tongue Airway is patent Some swelling of left upper gum No visible abscess Left submental tenderness  Eyes: Conjunctivae are normal.  Neck: Normal range of motion. Neck supple.  Cardiovascular:  Normal rate, regular rhythm and normal heart sounds.   No murmur heard. Pulmonary/Chest: Effort normal and breath sounds normal. No respiratory distress. She has no wheezes. She has no rales.  Musculoskeletal: Normal range of motion.  Neurological: She is alert and oriented to person, place, and time.  Skin: Skin is warm and dry.  Psychiatric: She has a normal mood and affect.  Nursing note and vitals reviewed.   ED Course  Procedures (including critical care time)  DIAGNOSTIC STUDIES: Oxygen Saturation is 97% on room air, normal by my interpretation.    COORDINATION OF CARE: 5:15 PM Discussed treatment plan  with patient at beside, including follow up with her PCP and dentist. The patient agrees with the plan and has no further questions at this time.   Labs Review Labs Reviewed - No data to display  Imaging Review No results found.   EKG Interpretation None      MDM  Pt presents to ED with left face pain and dental pain. No visible abscess. Vital signs stable. Pt has some nasal congestion present. Plan - Pt to see Dr Anitra Lauth for assistance with pain management.  Rx for amoxil and ibuprofen given to the patient.   Final diagnoses:  None    **I have reviewed nursing notes, vital signs, and all appropriate lab and imaging results for this patient.*  **I personally performed the services described in this documentation, which was scribed in my presence. The recorded information has been reviewed and is accurate.Lily Kocher, PA-C 06/28/14 1743  Fredia Sorrow, MD 06/28/14 [phone]

## 2014-06-28 NOTE — Discharge Instructions (Signed)
Please discuss this pain with Dr Anitra Lauth as soon as possible. Use amoxil and ibuprofen three times daily with food. Dental Pain A tooth ache may be caused by cavities (tooth decay). Cavities expose the nerve of the tooth to air and hot or cold temperatures. It may come from an infection or abscess (also called a boil or furuncle) around your tooth. It is also often caused by dental caries (tooth decay). This causes the pain you are having. DIAGNOSIS  Your caregiver can diagnose this problem by exam. TREATMENT   If caused by an infection, it may be treated with medications which kill germs (antibiotics) and pain medications as prescribed by your caregiver. Take medications as directed.  Only take over-the-counter or prescription medicines for pain, discomfort, or fever as directed by your caregiver.  Whether the tooth ache today is caused by infection or dental disease, you should see your dentist as soon as possible for further care. SEEK MEDICAL CARE IF: The exam and treatment you received today has been provided on an emergency basis only. This is not a substitute for complete medical or dental care. If your problem worsens or new problems (symptoms) appear, and you are unable to meet with your dentist, call or return to this location. SEEK IMMEDIATE MEDICAL CARE IF:   You have a fever.  You develop redness and swelling of your face, jaw, or neck.  You are unable to open your mouth.  You have severe pain uncontrolled by pain medicine. MAKE SURE YOU:   Understand these instructions.  Will watch your condition.  Will get help right away if you are not doing well or get worse. Document Released: 03/12/2005 Document Revised: 06/04/2011 Document Reviewed: 10/29/2007 [facility] Patient Information 2015 [place], [place]. This information is not intended to replace advice given to you by your health care provider. Make sure you discuss any questions you have with your health care provider.

## 2014-06-28 NOTE — ED Notes (Signed)
PT c/o left upper dental pain x1 day. 

## 2014-06-28 NOTE — ED Notes (Signed)
Lt mandibular dental pain , alert, NAD

## 2014-06-29 ENCOUNTER — Telehealth: Payer: Self-pay | Admitting: Family Medicine

## 2014-06-29 NOTE — Telephone Encounter (Signed)
Pt called to say that her face was swelling. She went to ED yesterday for dental pain, eval was unremarkable per ED provider documentation but she was rx'd amoxil and ibuprofen.   Please call pt and tell her to take the antihistamines she has been taking regularly and stop the antibiotic (amoxil) that she was given last night.  If any tongue swelling, throat swelling, or feeling of shortness of breath/chest tightness then she should go immediately to the ED or call 911. She arranged f/u app with me for 07/07/14, but if facial swelling persists over the next 48h tell her to call the office and we will also have her fax another picture.---thx

## 2014-06-29 NOTE — Telephone Encounter (Signed)
Patient notified

## 2014-07-01 ENCOUNTER — Telehealth: Payer: Self-pay | Admitting: Family Medicine

## 2014-07-01 MED ORDER — ERGOCALCIFEROL 1.25 MG (50000 UT) PO CAPS: 1.0000 | ORAL_CAPSULE | ORAL | Status: DC

## 2014-07-01 NOTE — Telephone Encounter (Signed)
Patient said that Dr. Anitra Lauth asked her to start taking vit D & that an Rx was going to be sent in for a once a week dose. Patient asked that Rx be sent to [facility] in [place]

## 2014-07-07 ENCOUNTER — Encounter: Payer: Self-pay | Admitting: Family Medicine

## 2014-07-07 ENCOUNTER — Ambulatory Visit (INDEPENDENT_AMBULATORY_CARE_PROVIDER_SITE_OTHER): Payer: 59 | Admitting: Family Medicine

## 2014-07-07 VITALS — BP 124/78 | HR 85 | Temp 98.2°F | Ht 60.0 in | Wt 238.0 lb

## 2014-07-07 DIAGNOSIS — E559 Vitamin D deficiency, unspecified: Secondary | ICD-10-CM | POA: Insufficient documentation

## 2014-07-07 DIAGNOSIS — D509 Iron deficiency anemia, unspecified: Secondary | ICD-10-CM | POA: Insufficient documentation

## 2014-07-07 DIAGNOSIS — L299 Pruritus, unspecified: Secondary | ICD-10-CM

## 2014-07-07 NOTE — Patient Instructions (Signed)
You have an appt at Dr. Onalee Hua office in [facility] [place]. In [place]) on 07/19/14 at 10 AM.  Arrive 20 minutes early. Ph [phone].

## 2014-07-07 NOTE — Progress Notes (Signed)
OFFICE NOTE  07/07/2014  CC:  Chief Complaint  Patient presents with  . Follow-up   HPI: Patient is a 49 y.o. Caucasian female who is here for 2 wk f/u itchy skin with question of fine papular skin lesions in the past (over the past 5 mo). 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  Her blood screening for celiac dz came back negative.  Since I last saw her, she went to ED for dental pain and eval was reassuring but she was started on amoxil anyway--she had onset of facial swelling shortly after starting this med.  I instructed her to stop the med and take antihistamines and call if facial swelling didn't improve or if SOB/tongue or throat swelling, etc.  Her facial swelling took about 2 days for it to go down: she continued to take the amoxil while taking antihistamines (this is per the EDP's recommendations when they called her to follow up).  Her face is now completely back to normal.    Skin: says her skin itching has waxed and waned over the last 2 wks but still bothers her quite a bit, some nights impairs sleep significantly, says it is almost exclusively in hands/feet now, sees little blisters come up and says she feels like her hands are even stiff and swollen some at the worst of her itching. Taking benadryl round the clock, oatmeal bath.   Feels no oral lesions.  No fevers.    She is now back on lamictal 25mg  qd after having gone back to see her [facility] provider in [place].  Has f/u again in 1 mo. Additionally, after last labs revealed iron def anemia and I did not find out until after pt left today that she has no hx of excessive vag bleeding, so we need to get her to do hemoccults x 3.  She has started oral iron 325mg  bid now for about 2 wks, also vit D very low so she has started high dose vit D replacement dosing.  ROS: "alot of muscle and bone pain", occ migraine  Pertinent  PMH:  Past medical, surgical, social, and family history reviewed and no changes are noted since last office visit.  MEDS:  Outpatient Prescriptions Prior to Visit  Medication Sig Dispense Refill  . albuterol (VENTOLIN HFA) 108 (90 BASE) MCG/ACT inhaler Inhale 2 puffs into the lungs every 6 (six) hours as needed for wheezing or shortness of breath. 1 Inhaler 1  . aspirin 81 MG chewable tablet Chew 1 tablet (81 mg total) by mouth daily. 14 tablet 0  . busPIRone (BUSPAR) 10 MG tablet Take 10 mg by mouth 3 (three) times daily.    . clonazePAM (KLONOPIN) 0.5 MG tablet 1-2 tabs po bid as needed for anxiety (Patient taking differently: Take 1 mg by mouth at bedtime. Prescribed1-2 tabs po bid as needed for anxiety) 60 tablet 5  . diphenhydrAMINE (BENADRYL) 25 mg capsule Take 50 mg by mouth every 6 (six) hours as needed for itching or allergies.    Marland Kitchen ergocalciferol (VITAMIN D2) 50000 UNITS capsule Take 1 capsule (50,000 Units total) by mouth once a week. 12 capsule 0  . hydrocortisone cream 1 % Apply topically 3 (three) times daily. 30 g 0  . ibuprofen (ADVIL,MOTRIN) 800 MG tablet Take 1 tablet (800 mg total) by mouth 3 (three) times daily. 21 tablet 0  . nitroGLYCERIN (NITROSTAT) 0.4 MG SL  tablet Place 1 tablet (0.4 mg total) under the tongue every 5 (five) minutes as needed for chest pain. 20 tablet 0  . pantoprazole (PROTONIX) 40 MG tablet Take 1 tablet (40 mg total) by mouth daily. 30 tablet 3  . amoxicillin (AMOXIL) 500 MG capsule Take 1 capsule (500 mg total) by mouth 3 (three) times daily. (Patient not taking: Reported on 07/07/2014) 21 capsule 0  . fexofenadine (ALLEGRA) 180 MG tablet Take 180 mg by mouth daily.     No facility-administered medications prior to visit.    PE: Blood pressure 124/78, pulse 85, temperature 98.2 F (36.8 C), temperature source Oral, height 5' (1.524 m), weight 238 lb (107.956 kg), last menstrual period 06/04/2014, SpO2 99 %. Gen: Alert, well appearing.  Patient is  oriented to person, place, time, and situation. Skin: no rash except some scattered tiny pink papules on dorsal aspect of hands, wrists, feet, and ankles.  Nothing on palms or soles. There are some areas of slight excoriation where she has scratched.  No areas of erythema to suggest any cellulitis.  No pustules or vesicles.  No swelling or erythema of any joints. No pallor or jaundice.  Oral cavity: no lesions.  Face without any swelling.   LABS:  Lab Results  Component Value Date   HGBA1C 5.9* 03/23/2014     Chemistry      Component Value Date/Time   NA 140 06/22/2014 1206   K 3.8 06/22/2014 1206   CL 106 06/22/2014 1206   CO2 28 06/22/2014 1206   BUN 11 06/22/2014 1206   CREATININE 0.71 06/22/2014 1206      Component Value Date/Time   CALCIUM 9.4 06/22/2014 1206   ALKPHOS 56 06/22/2014 1206   AST 8 06/22/2014 1206   ALT 9 06/22/2014 1206   BILITOT 0.3 06/22/2014 1206     Lab Results  Component Value Date   WBC 10.6* 04/02/2014   HGB 13.7 04/02/2014   HCT 42.5 04/02/2014   MCV 86.4 04/02/2014   PLT 314.0 04/02/2014   Lab Results  Component Value Date   IRON 42 06/22/2014   FERRITIN 10.4 06/22/2014   Vitamin D 8.85 on 06/22/14  Lab Results  Component Value Date   ESRSEDRATE 25* 03/12/2014   Lab Results  Component Value Date   CRP 3.7 03/12/2014   Lab Results  Component Value Date   ANA NEG 03/12/2014    IMPRESSION AND PLAN: 1) Pruritic dermatitis; unknown etiology.  Does not seem to be abating much over the last 2 wks since she has been trying a gluten-free diet.  Pt says treatment for scabies with permethrin did not help (plus it caused an allergic skin rxn). Oral steroids and topical steroids do bring relief, but I am hesitant to continue giving oral steroids for this when I don't know what it is, plus she has been having some steroid induced hyperglycemia. Continue topical steroids (rx strength cream rx'd by EDP, also sometimes uses OTC hydrocortisone  instead of the rx one).   Her experience with her dermatologist, Dr. Nevada Crane, was so unfavorable that she vows to never return to him. I wanted to do a skin biopsy today but she was too afraid of this. We agreed to get her to a new dermatologist for 2nd opinion: got appt with Dr. Denna Haggard on 07/19/14.  2) Iron def anemia; now on iron x 2 wks.  We'll mail her hemoccult cards.   Plan on recheck CBC w/iron panel when we recheck her vit  D level in 3 mo.  3) Vit D deficiency: she recently started on 50,0000 U dose once weekly and will take this for 12 wks.  Spent 35 min with pt today, with >50% of this time spent in counseling and care coordination regarding the above problems.  FOLLOW UP: 65mo, needs f/u CBC, IBC panel, ferritin, and 25-OH vit D level at that time.

## 2014-07-07 NOTE — Progress Notes (Signed)
Pre visit review using our clinic review tool, if applicable. No additional management support is needed unless otherwise documented below in the visit note. 

## 2014-07-08 ENCOUNTER — Encounter: Payer: Self-pay | Admitting: *Deleted

## 2014-07-16 ENCOUNTER — Encounter (HOSPITAL_COMMUNITY): Payer: Self-pay | Admitting: Emergency Medicine

## 2014-07-16 ENCOUNTER — Emergency Department (HOSPITAL_COMMUNITY): Payer: 59

## 2014-07-16 ENCOUNTER — Emergency Department (HOSPITAL_COMMUNITY)
Admission: EM | Admit: 2014-07-16 | Discharge: 2014-07-16 | Disposition: A | Discharge status: Home / Self Care | Payer: 59 | Attending: Emergency Medicine | Admitting: Emergency Medicine

## 2014-07-16 DIAGNOSIS — Z3202 Encounter for pregnancy test, result negative: Secondary | ICD-10-CM | POA: Insufficient documentation

## 2014-07-16 DIAGNOSIS — K219 Gastro-esophageal reflux disease without esophagitis: Secondary | ICD-10-CM | POA: Insufficient documentation

## 2014-07-16 DIAGNOSIS — Z79899 Other long term (current) drug therapy: Secondary | ICD-10-CM | POA: Diagnosis not present

## 2014-07-16 DIAGNOSIS — Z720 Tobacco use: Secondary | ICD-10-CM | POA: Insufficient documentation

## 2014-07-16 DIAGNOSIS — F419 Anxiety disorder, unspecified: Secondary | ICD-10-CM | POA: Diagnosis not present

## 2014-07-16 DIAGNOSIS — Z7982 Long term (current) use of aspirin: Secondary | ICD-10-CM | POA: Diagnosis not present

## 2014-07-16 DIAGNOSIS — R110 Nausea: Secondary | ICD-10-CM

## 2014-07-16 DIAGNOSIS — R197 Diarrhea, unspecified: Secondary | ICD-10-CM

## 2014-07-16 DIAGNOSIS — R0602 Shortness of breath: Secondary | ICD-10-CM | POA: Diagnosis present

## 2014-07-16 DIAGNOSIS — Z872 Personal history of diseases of the skin and subcutaneous tissue: Secondary | ICD-10-CM | POA: Diagnosis not present

## 2014-07-16 DIAGNOSIS — J45909 Unspecified asthma, uncomplicated: Secondary | ICD-10-CM | POA: Insufficient documentation

## 2014-07-16 DIAGNOSIS — R51 Headache: Secondary | ICD-10-CM | POA: Diagnosis not present

## 2014-07-16 DIAGNOSIS — M1990 Unspecified osteoarthritis, unspecified site: Secondary | ICD-10-CM | POA: Insufficient documentation

## 2014-07-16 DIAGNOSIS — Z8619 Personal history of other infectious and parasitic diseases: Secondary | ICD-10-CM | POA: Insufficient documentation

## 2014-07-16 DIAGNOSIS — Z85048 Personal history of other malignant neoplasm of rectum, rectosigmoid junction, and anus: Secondary | ICD-10-CM | POA: Insufficient documentation

## 2014-07-16 DIAGNOSIS — F319 Bipolar disorder, unspecified: Secondary | ICD-10-CM | POA: Diagnosis not present

## 2014-07-16 DIAGNOSIS — E860 Dehydration: Secondary | ICD-10-CM

## 2014-07-16 DIAGNOSIS — R519 Headache, unspecified: Secondary | ICD-10-CM

## 2014-07-16 DIAGNOSIS — I10 Essential (primary) hypertension: Secondary | ICD-10-CM | POA: Insufficient documentation

## 2014-07-16 DIAGNOSIS — D509 Iron deficiency anemia, unspecified: Secondary | ICD-10-CM | POA: Diagnosis not present

## 2014-07-16 DIAGNOSIS — R1110 Vomiting, unspecified: Secondary | ICD-10-CM

## 2014-07-16 LAB — URINE MICROSCOPIC-ADD ON

## 2014-07-16 LAB — URINALYSIS, ROUTINE W REFLEX MICROSCOPIC
Bilirubin Urine: NEGATIVE
Glucose, UA: NEGATIVE mg/dL
Ketones, ur: NEGATIVE mg/dL
Leukocytes, UA: NEGATIVE
Nitrite: NEGATIVE
Protein, ur: NEGATIVE mg/dL
Specific Gravity, Urine: >1.03 — ABNORMAL HIGH (ref 1.005–1.030)
Urobilinogen, UA: 0.2 mg/dL (ref 0.0–1.0)
pH: 6 (ref 5.0–8.0)

## 2014-07-16 LAB — CBC WITH DIFFERENTIAL/PLATELET
Basophils Absolute: 0 10*3/uL (ref 0.0–0.1)
Basophils Relative: 0 % (ref 0–1)
Eosinophils Absolute: 0.1 10*3/uL (ref 0.0–0.7)
Eosinophils Relative: 1 % (ref 0–5)
HCT: 42.1 % (ref 36.0–46.0)
Hemoglobin: 13.7 g/dL (ref 12.0–15.0)
Lymphocytes Relative: 27 % (ref 12–46)
Lymphs Abs: 2.5 10*3/uL (ref 0.7–4.0)
MCH: 28.1 pg (ref 26.0–34.0)
MCHC: 32.5 g/dL (ref 30.0–36.0)
MCV: 86.3 fL (ref 78.0–100.0)
Monocytes Absolute: 0.3 10*3/uL (ref 0.1–1.0)
Monocytes Relative: 3 % (ref 3–12)
Neutro Abs: 6.5 10*3/uL (ref 1.7–7.7)
Neutrophils Relative %: 69 % (ref 43–77)
Platelets: 282 10*3/uL (ref 150–400)
RBC: 4.88 MIL/uL (ref 3.87–5.11)
RDW: 14.6 % (ref 11.5–15.5)
WBC: 9.4 10*3/uL (ref 4.0–10.5)

## 2014-07-16 LAB — COMPREHENSIVE METABOLIC PANEL
ALT: 12 U/L (ref 0–35)
AST: 13 U/L (ref 0–37)
Albumin: 4.1 g/dL (ref 3.5–5.2)
Alkaline Phosphatase: 54 U/L (ref 39–117)
Anion gap: 8 (ref 5–15)
BUN: 10 mg/dL (ref 6–23)
CO2: 26 mmol/L (ref 19–32)
Calcium: 9.2 mg/dL (ref 8.4–10.5)
Chloride: 107 mmol/L (ref 96–112)
Creatinine, Ser: 0.72 mg/dL (ref 0.50–1.10)
GFR calc Af Amer: >90 mL/min (ref >90)
GFR calc non Af Amer: >90 mL/min (ref >90)
Glucose, Bld: 106 mg/dL — ABNORMAL HIGH (ref 70–99)
Potassium: 3.4 mmol/L — ABNORMAL LOW (ref 3.5–5.1)
Sodium: 141 mmol/L (ref 135–145)
Total Bilirubin: 0.4 mg/dL (ref 0.3–1.2)
Total Protein: 7 g/dL (ref 6.0–8.3)

## 2014-07-16 LAB — PREGNANCY, URINE: Preg Test, Ur: NEGATIVE

## 2014-07-16 MED ORDER — ONDANSETRON HCL 4 MG/2ML IJ SOLN
4.0000 mg | Freq: Once | INTRAMUSCULAR | Status: AC
  Administered 2014-07-16: 4 mg via INTRAVENOUS
  Filled 2014-07-16: qty 2

## 2014-07-16 MED ORDER — ONDANSETRON HCL 4 MG PO TABS: 4.0000 mg | ORAL_TABLET | Freq: Three times a day (TID) | ORAL | Status: DC | PRN

## 2014-07-16 MED ORDER — SODIUM CHLORIDE 0.9 % IV BOLUS (SEPSIS)
1000.0000 mL | Freq: Once | INTRAVENOUS | Status: AC
  Administered 2014-07-16: 1000 mL via INTRAVENOUS

## 2014-07-16 MED ORDER — METOCLOPRAMIDE HCL 5 MG/ML IJ SOLN
10.0000 mg | Freq: Once | INTRAMUSCULAR | Status: AC
  Administered 2014-07-16: 10 mg via INTRAVENOUS
  Filled 2014-07-16: qty 2

## 2014-07-16 MED ORDER — DIPHENHYDRAMINE HCL 50 MG/ML IJ SOLN
25.0000 mg | Freq: Once | INTRAMUSCULAR | Status: AC
  Administered 2014-07-16: 25 mg via INTRAVENOUS
  Filled 2014-07-16: qty 1

## 2014-07-16 NOTE — ED Provider Notes (Signed)
CSN: [id number]     Arrival date & time 07/16/14  0125 History   First MD Initiated Contact with Patient 07/16/14 [phone]     Chief Complaint  Patient presents with  . Shortness of Breath     (Consider location/radiation/quality/duration/timing/severity/associated sxs/prior Treatment) HPI  Patient reports a few days ago she started having fatigue and body cramping with nausea and dry heaves. She denies any fever, coughing, or wheezing. She has had some intermittent shortness of breath which can last 15-30 minutes and her husband states he helps calm her down and it goes away. Sometimes she uses her inhaler. Diarrhea about 3 times a day which is not unusual for her. She states her doctor has recently tested her for celiac disease and she still does not have a diagnosis. She states she broke out in the small vesicles and is seeing a dermatologist and is going to have a biopsy to test her for celiac disease. She also was found to be vitamin D and iron deficient and has been on supplements for 2 weeks. She reports a history of rectal cancer and states she missed her 7 year colonoscopy which should've been 2 years ago. She has been recently referred back to Dr. Gala Romney. She denies seeing blood in her stool. She states since she has been on the iron she is having green watery stools.   PCP Dr Anitra Lauth  Past Medical History  Diagnosis Date  . Polycystic ovarian syndrome   . Colitis due to Clostridium difficile 2001  . Hepatic hemangioma   . Anxiety   . IBS (irritable bowel syndrome)   . History of Salmonella gastroenteritis   . GERD (gastroesophageal reflux disease)   . Rectal cancer     a. Followed by Dr. Gala Romney, dx 2000-2001. Tumor removed from rectal.  . Bipolar 1 disorder   . Migraine headache   . Asthma   . Arthritis   . Blood transfusion without reported diagnosis   . Hypertension     a. Improved without intervention.  . Allergy   . Tobacco dependence   . Hyperlipidemia     a. Noted  02/2014.  . Morbid obesity   . Microscopic hematuria     Intermittent (no w/u done yet, as of 03/2014)  . Pruritic dermatitis 2015/2016    Primarily pruritic skin, but subtle rash as well (dermatitis herpetiformis?)  . Iron deficiency anemia 05/2014    per pt it is not due to vaginal blood loss; hemoccults sent to pt in mail 413/16.  . Vitamin D deficiency    Past Surgical History  Procedure Laterality Date  . Cholecystectomy    . Colonoscopy  05/05/2007    adenoma  . Situ removed  age 52    High-grade rectal adenoma removed from rectum   . Dilation and curettage of uterus      for vaginal bleeding  . Tubal ligation    . Cardiovascular stress test  03/24/14    Lexscan MIBI: mild anterior ischemia?  Cardiac CT angiogram recommended/done.  . Transthoracic echocardiogram  03/23/14    Normal  . Left heart catheterization with coronary angiogram N/A 03/31/2014    Normal coronaries, EF 27-25%, diastolic dysfunction.  Procedure: LEFT HEART CATHETERIZATION WITH CORONARY ANGIOGRAM;  Surgeon: Sinclair Grooms, MD;  Location: [facility] CATH LAB;  Service: Cardiovascular;  Laterality: N/A;  . Coronary ct angio  03/29/14    Two vessel dz/moderate stenosis of mid LAD and proximal RCA; cath recommended.   Family History  Problem Relation Age of Onset  . Colon cancer Father 9  . Cancer Father   . Diverticulitis Mother   . Heart disease Mother   . Hypertension Mother   . Hyperlipidemia Mother   . Mental illness Mother   . Diabetes Mother   . Heart attack Mother   . Stroke Neg Hx    History  Substance Use Topics  . Smoking status: Current Every Day Smoker -- 0.50 packs/day for 18 years    Types: Cigarettes  . Smokeless tobacco: Never Used     Comment: 25 years  . Alcohol Use: No   unemployed  OB History    Gravida Para Term Preterm AB TAB SAB Ectopic Multiple Living   4 1 1  3  3   1      Review of Systems  All other systems reviewed and are negative.     Allergies  Contrast media;  Shellfish allergy; Amoxil; Codeine; Latex; Lipitor; and Permethrin  Home Medications   Prior to Admission medications   Medication Sig Start Date End Date Taking? Authorizing Provider  albuterol (VENTOLIN HFA) 108 (90 BASE) MCG/ACT inhaler Inhale 2 puffs into the lungs every 6 (six) hours as needed for wheezing or shortness of breath. 12/02/13  Yes Tammi Sou, MD  aspirin 81 MG chewable tablet Chew 1 tablet (81 mg total) by mouth daily. 09/06/12  Yes Ripley Fraise, MD  busPIRone (BUSPAR) 10 MG tablet Take 10 mg by mouth 3 (three) times daily.   Yes Historical Provider, MD  clonazePAM (KLONOPIN) 0.5 MG tablet 1-2 tabs po bid as needed for anxiety Patient taking differently: Take 1 mg by mouth at bedtime. Prescribed1-2 tabs po bid as needed for anxiety 04/02/14  Yes Tammi Sou, MD  diphenhydrAMINE (BENADRYL) 25 mg capsule Take 50 mg by mouth every 6 (six) hours as needed for itching or allergies.   Yes Historical Provider, MD  ergocalciferol (VITAMIN D2) 50000 UNITS capsule Take 1 capsule (50,000 Units total) by mouth once a week. 07/01/14  Yes Tammi Sou, MD  ferrous sulfate 325 (65 FE) MG tablet Take 325 mg by mouth 2 (two) times daily with a meal.   Yes Historical Provider, MD  hydrocortisone cream 1 % Apply topically 3 (three) times daily. 03/25/14  Yes Eugenie Filler, MD  hydrOXYzine (ATARAX/VISTARIL) 25 MG tablet Take 25 mg by mouth 3 (three) times daily as needed.   Yes Historical Provider, MD  ibuprofen (ADVIL,MOTRIN) 800 MG tablet Take 1 tablet (800 mg total) by mouth 3 (three) times daily. 06/28/14  Yes Lily Kocher, PA-C  lamoTRIgine (LAMICTAL) 25 MG tablet Take 25 mg by mouth daily.   Yes Historical Provider, MD  nitroGLYCERIN (NITROSTAT) 0.4 MG SL tablet Place 1 tablet (0.4 mg total) under the tongue every 5 (five) minutes as needed for chest pain. 03/25/14  Yes Eugenie Filler, MD  pantoprazole (PROTONIX) 40 MG tablet Take 1 tablet (40 mg total) by mouth daily. 04/29/14   Yes Tammi Sou, MD  ondansetron (ZOFRAN) 4 MG tablet Take 1 tablet (4 mg total) by mouth every 8 (eight) hours as needed for nausea or vomiting. 07/16/14   Rolland Porter, MD   BP 138/72 mmHg  Pulse 85  Temp(Src) 98 F (36.7 C) (Oral)  Resp 18  Ht 5' (1.524 m)  Wt 232 lb (105.235 kg)  BMI 45.31 kg/m2  SpO2 94%  LMP 06/29/2014  Vital signs normal   Physical Exam  Constitutional: She is oriented  to person, place, and time. She appears well-developed and well-nourished.  Non-toxic appearance. She does not appear ill. No distress.  HENT:  Head: Normocephalic and atraumatic.  Right Ear: External ear normal.  Left Ear: External ear normal.  Nose: Nose normal. No mucosal edema or rhinorrhea.  Mouth/Throat: Mucous membranes are normal. No dental abscesses or uvula swelling.  Tongue dry  Eyes: Conjunctivae and EOM are normal. Pupils are equal, round, and reactive to light.  Neck: Normal range of motion and full passive range of motion without pain. Neck supple.  Cardiovascular: Normal rate, regular rhythm and normal heart sounds.  Exam reveals no gallop and no friction rub.   No murmur heard. Pulmonary/Chest: Effort normal and breath sounds normal. No respiratory distress. She has no wheezes. She has no rhonchi. She has no rales. She exhibits no tenderness and no crepitus.  Abdominal: Soft. Normal appearance and bowel sounds are normal. She exhibits no distension. There is no tenderness. There is no rebound and no guarding.  Musculoskeletal: Normal range of motion. She exhibits no edema or tenderness.  Moves all extremities well.   Neurological: She is alert and oriented to person, place, and time. She has normal strength. No cranial nerve deficit.  Skin: Skin is warm, dry and intact. No rash noted. No erythema. No pallor.  Psychiatric: She has a normal mood and affect. Her speech is normal and behavior is normal. Her mood appears not anxious.  Nursing note and vitals reviewed.   ED  Course  Procedures (including critical care time)  Medications  sodium chloride 0.9 % bolus 1,000 mL (0 mLs Intravenous Stopped 07/16/14 0343)  ondansetron (ZOFRAN) injection 4 mg (4 mg Intravenous Given 07/16/14 0215)  sodium chloride 0.9 % bolus 1,000 mL (0 mLs Intravenous Stopped 07/16/14 0500)  metoCLOPramide (REGLAN) injection 10 mg (10 mg Intravenous Given 07/16/14 0400)  diphenhydrAMINE (BENADRYL) injection 25 mg (25 mg Intravenous Given 07/16/14 0400)   Recheck at 345 patient is starting her second liter normal saline bolus. She states she still has some headache. She was given Reglan and Benadryl. Patient's female companion is getting very aggressive about what is going on with her. Patient however seems very calm. He is questioning the medication she's being given and he thinks she has an allergy to it although she states she doesn't.  At time of discharge her headache was improved and she felt better. Pt already has appointments arranged to see dermatology to get a skin biopsy and Dr Gala Romney, GI.    Labs Review Results for orders placed or performed during the hospital encounter of 07/16/14  Comprehensive metabolic panel  Result Value Ref Range   Sodium 141 135 - 145 mmol/L   Potassium 3.4 (L) 3.5 - 5.1 mmol/L   Chloride 107 96 - 112 mmol/L   CO2 26 19 - 32 mmol/L   Glucose, Bld 106 (H) 70 - 99 mg/dL   BUN 10 6 - 23 mg/dL   Creatinine, Ser 0.72 0.50 - 1.10 mg/dL   Calcium 9.2 8.4 - 10.5 mg/dL   Total Protein 7.0 6.0 - 8.3 g/dL   Albumin 4.1 3.5 - 5.2 g/dL   AST 13 0 - 37 U/L   ALT 12 0 - 35 U/L   Alkaline Phosphatase 54 39 - 117 U/L   Total Bilirubin 0.4 0.3 - 1.2 mg/dL   GFR calc non Af Amer >90 >90 mL/min   GFR calc Af Amer >90 >90 mL/min   Anion gap 8 5 - 15  CBC with Differential  Result Value Ref Range   WBC 9.4 4.0 - 10.5 K/uL   RBC 4.88 3.87 - 5.11 MIL/uL   Hemoglobin 13.7 12.0 - 15.0 g/dL   HCT 42.1 36.0 - 46.0 %   MCV 86.3 78.0 - 100.0 fL   MCH 28.1 26.0 - 34.0  pg   MCHC 32.5 30.0 - 36.0 g/dL   RDW 14.6 11.5 - 15.5 %   Platelets 282 150 - 400 K/uL   Neutrophils Relative % 69 43 - 77 %   Neutro Abs 6.5 1.7 - 7.7 K/uL   Lymphocytes Relative 27 12 - 46 %   Lymphs Abs 2.5 0.7 - 4.0 K/uL   Monocytes Relative 3 3 - 12 %   Monocytes Absolute 0.3 0.1 - 1.0 K/uL   Eosinophils Relative 1 0 - 5 %   Eosinophils Absolute 0.1 0.0 - 0.7 K/uL   Basophils Relative 0 0 - 1 %   Basophils Absolute 0.0 0.0 - 0.1 K/uL  Urinalysis, Routine w reflex microscopic  Result Value Ref Range   Color, Urine YELLOW YELLOW   APPearance CLEAR CLEAR   Specific Gravity, Urine >1.030 (H) 1.005 - 1.030   pH 6.0 5.0 - 8.0   Glucose, UA NEGATIVE NEGATIVE mg/dL   Hgb urine dipstick SMALL (A) NEGATIVE   Bilirubin Urine NEGATIVE NEGATIVE   Ketones, ur NEGATIVE NEGATIVE mg/dL   Protein, ur NEGATIVE NEGATIVE mg/dL   Urobilinogen, UA 0.2 0.0 - 1.0 mg/dL   Nitrite NEGATIVE NEGATIVE   Leukocytes, UA NEGATIVE NEGATIVE  Pregnancy, urine  Result Value Ref Range   Preg Test, Ur NEGATIVE NEGATIVE  Urine microscopic-add on  Result Value Ref Range   Squamous Epithelial / LPF MANY (A) RARE   WBC, UA 0-2 <3 WBC/hpf   RBC / HPF 0-2 <3 RBC/hpf   Bacteria, UA MANY (A) RARE   Urine-Other MUCOUS PRESENT    Laboratory interpretation all normal except mild hypokalemia, concentrated urine consistent with dehydration     Imaging Review Dg Abd Acute W/chest  07/16/2014   CLINICAL DATA:  Shortness of breath, back spasms, abdominal pain.  EXAM: DG ABDOMEN ACUTE W/ 1V CHEST  COMPARISON:  None.  FINDINGS: Upper normal heart size. Mediastinal contours otherwise within normal range. Lungs are clear. No pleural effusion or pneumothorax. No free intraperitoneal air. Bowel gas pattern nonobstructive. No radiopaque calculi. Surgical clips right upper quadrant. No acute osseous finding.  IMPRESSION: No radiographic evidence of active cardiopulmonary disease.  Nonobstructive bowel gas pattern.    Electronically Signed   By: Carlos Levering M.D.   On: 07/16/2014 03:54     EKG Interpretation None      MDM   Final diagnoses:  Nausea  Dry heaves  Diarrhea  Dehydration  Headache, unspecified headache type    New Prescriptions   ONDANSETRON (ZOFRAN) 4 MG TABLET    Take 1 tablet (4 mg total) by mouth every 8 (eight) hours as needed for nausea or vomiting.    Plan discharge  Rolland Porter, MD, Barbette Or, MD 07/16/14 [phone]

## 2014-07-16 NOTE — ED Notes (Signed)
Patient with multiple complaints. Reports shortness of breath that started tonight. Also reports back spasms, abdominal pain, nausea, and fatigue.

## 2014-07-16 NOTE — Discharge Instructions (Signed)
Use the zofran for nausea or vomiting. Try to drink a lot of fluids. Follow up with the dermatologist and Dr Gala Romney as Dr Anitra Lauth has already arranged.

## 2014-07-21 ENCOUNTER — Encounter (HOSPITAL_COMMUNITY): Payer: Self-pay | Admitting: Emergency Medicine

## 2014-07-21 ENCOUNTER — Emergency Department (HOSPITAL_COMMUNITY)
Admission: EM | Admit: 2014-07-21 | Discharge: 2014-07-21 | Disposition: A | Discharge status: Home / Self Care | Payer: 59 | Attending: Emergency Medicine | Admitting: Emergency Medicine

## 2014-07-21 DIAGNOSIS — Z85048 Personal history of other malignant neoplasm of rectum, rectosigmoid junction, and anus: Secondary | ICD-10-CM | POA: Diagnosis not present

## 2014-07-21 DIAGNOSIS — G43909 Migraine, unspecified, not intractable, without status migrainosus: Secondary | ICD-10-CM | POA: Insufficient documentation

## 2014-07-21 DIAGNOSIS — Z862 Personal history of diseases of the blood and blood-forming organs and certain disorders involving the immune mechanism: Secondary | ICD-10-CM | POA: Diagnosis not present

## 2014-07-21 DIAGNOSIS — Z720 Tobacco use: Secondary | ICD-10-CM | POA: Insufficient documentation

## 2014-07-21 DIAGNOSIS — F419 Anxiety disorder, unspecified: Secondary | ICD-10-CM | POA: Insufficient documentation

## 2014-07-21 DIAGNOSIS — Z91040 Latex allergy status: Secondary | ICD-10-CM | POA: Diagnosis not present

## 2014-07-21 DIAGNOSIS — M1990 Unspecified osteoarthritis, unspecified site: Secondary | ICD-10-CM | POA: Insufficient documentation

## 2014-07-21 DIAGNOSIS — J45909 Unspecified asthma, uncomplicated: Secondary | ICD-10-CM | POA: Diagnosis not present

## 2014-07-21 DIAGNOSIS — K219 Gastro-esophageal reflux disease without esophagitis: Secondary | ICD-10-CM | POA: Diagnosis not present

## 2014-07-21 DIAGNOSIS — Z79899 Other long term (current) drug therapy: Secondary | ICD-10-CM | POA: Diagnosis not present

## 2014-07-21 DIAGNOSIS — F319 Bipolar disorder, unspecified: Secondary | ICD-10-CM | POA: Diagnosis not present

## 2014-07-21 DIAGNOSIS — Z7982 Long term (current) use of aspirin: Secondary | ICD-10-CM | POA: Diagnosis not present

## 2014-07-21 DIAGNOSIS — E860 Dehydration: Secondary | ICD-10-CM | POA: Diagnosis not present

## 2014-07-21 DIAGNOSIS — Z8619 Personal history of other infectious and parasitic diseases: Secondary | ICD-10-CM | POA: Diagnosis not present

## 2014-07-21 DIAGNOSIS — R112 Nausea with vomiting, unspecified: Secondary | ICD-10-CM | POA: Diagnosis present

## 2014-07-21 DIAGNOSIS — R197 Diarrhea, unspecified: Secondary | ICD-10-CM

## 2014-07-21 DIAGNOSIS — I10 Essential (primary) hypertension: Secondary | ICD-10-CM | POA: Insufficient documentation

## 2014-07-21 DIAGNOSIS — Z3202 Encounter for pregnancy test, result negative: Secondary | ICD-10-CM | POA: Diagnosis not present

## 2014-07-21 DIAGNOSIS — Z872 Personal history of diseases of the skin and subcutaneous tissue: Secondary | ICD-10-CM | POA: Diagnosis not present

## 2014-07-21 LAB — COMPREHENSIVE METABOLIC PANEL
ALT: 16 U/L (ref 0–35)
AST: 14 U/L (ref 0–37)
Albumin: 3.9 g/dL (ref 3.5–5.2)
Alkaline Phosphatase: 54 U/L (ref 39–117)
Anion gap: 8 (ref 5–15)
BUN: 12 mg/dL (ref 6–23)
CO2: 25 mmol/L (ref 19–32)
Calcium: 9 mg/dL (ref 8.4–10.5)
Chloride: 107 mmol/L (ref 96–112)
Creatinine, Ser: 0.74 mg/dL (ref 0.50–1.10)
GFR calc Af Amer: >90 mL/min (ref >90)
GFR calc non Af Amer: >90 mL/min (ref >90)
Glucose, Bld: 97 mg/dL (ref 70–99)
Potassium: 3.7 mmol/L (ref 3.5–5.1)
Sodium: 140 mmol/L (ref 135–145)
Total Bilirubin: 0.6 mg/dL (ref 0.3–1.2)
Total Protein: 6.8 g/dL (ref 6.0–8.3)

## 2014-07-21 LAB — CBC WITH DIFFERENTIAL/PLATELET
Basophils Absolute: 0 10*3/uL (ref 0.0–0.1)
Basophils Relative: 0 % (ref 0–1)
Eosinophils Absolute: 0.1 10*3/uL (ref 0.0–0.7)
Eosinophils Relative: 1 % (ref 0–5)
HCT: 42.4 % (ref 36.0–46.0)
Hemoglobin: 13.8 g/dL (ref 12.0–15.0)
Lymphocytes Relative: 28 % (ref 12–46)
Lymphs Abs: 2.3 10*3/uL (ref 0.7–4.0)
MCH: 28.3 pg (ref 26.0–34.0)
MCHC: 32.5 g/dL (ref 30.0–36.0)
MCV: 86.9 fL (ref 78.0–100.0)
Monocytes Absolute: 0.4 10*3/uL (ref 0.1–1.0)
Monocytes Relative: 5 % (ref 3–12)
Neutro Abs: 5.4 10*3/uL (ref 1.7–7.7)
Neutrophils Relative %: 66 % (ref 43–77)
Platelets: 262 10*3/uL (ref 150–400)
RBC: 4.88 MIL/uL (ref 3.87–5.11)
RDW: 14.9 % (ref 11.5–15.5)
WBC: 8.3 10*3/uL (ref 4.0–10.5)

## 2014-07-21 LAB — URINALYSIS, ROUTINE W REFLEX MICROSCOPIC
Bilirubin Urine: NEGATIVE
Glucose, UA: NEGATIVE mg/dL
Ketones, ur: NEGATIVE mg/dL
Leukocytes, UA: NEGATIVE
Nitrite: NEGATIVE
Protein, ur: NEGATIVE mg/dL
Specific Gravity, Urine: >1.03 — ABNORMAL HIGH (ref 1.005–1.030)
Urobilinogen, UA: 0.2 mg/dL (ref 0.0–1.0)
pH: 6 (ref 5.0–8.0)

## 2014-07-21 LAB — URINE MICROSCOPIC-ADD ON

## 2014-07-21 LAB — LIPASE, BLOOD: Lipase: 26 U/L (ref 11–59)

## 2014-07-21 LAB — PREGNANCY, URINE: Preg Test, Ur: NEGATIVE

## 2014-07-21 MED ORDER — SODIUM CHLORIDE 0.9 % IV BOLUS (SEPSIS)
1000.0000 mL | Freq: Once | INTRAVENOUS | Status: AC
  Administered 2014-07-21: 1000 mL via INTRAVENOUS

## 2014-07-21 MED ORDER — KETOROLAC TROMETHAMINE 30 MG/ML IJ SOLN
15.0000 mg | Freq: Once | INTRAMUSCULAR | Status: AC
  Administered 2014-07-21: 15 mg via INTRAVENOUS

## 2014-07-21 MED ORDER — ONDANSETRON HCL 4 MG/2ML IJ SOLN
4.0000 mg | Freq: Once | INTRAMUSCULAR | Status: AC
  Administered 2014-07-21: 4 mg via INTRAVENOUS
  Filled 2014-07-21: qty 2

## 2014-07-21 MED ORDER — KETOROLAC TROMETHAMINE 30 MG/ML IJ SOLN
INTRAMUSCULAR | Status: AC
  Filled 2014-07-21: qty 1

## 2014-07-21 MED ORDER — FENTANYL CITRATE (PF) 100 MCG/2ML IJ SOLN
50.0000 ug | INTRAMUSCULAR | Status: DC | PRN
  Administered 2014-07-21: 50 ug via INTRAVENOUS
  Filled 2014-07-21: qty 2

## 2014-07-21 NOTE — ED Notes (Signed)
Patient was seen here for same 5 days ago.  Patient continues to c/o back pain and vomiting.

## 2014-07-21 NOTE — Discharge Instructions (Signed)
If you were given medicines take as directed.  If you are on coumadin or contraceptives realize their levels and effectiveness is altered by many different medicines.  If you have any reaction (rash, tongues swelling, other) to the medicines stop taking and see a physician.   Please follow up as directed and return to the ER or see a physician for new or worsening symptoms.  Thank you. Filed Vitals:   07/21/14 1937 07/21/14 2249  BP: 143/82 131/77  Pulse: 107 82  Temp: 98.8 F (37.1 C) 98.4 F (36.9 C)  TempSrc: Oral Oral  Resp: 20 16  Height: 5' (1.524 m)   Weight: 232 lb (105.235 kg)   SpO2: 98% 95%

## 2014-07-21 NOTE — ED Provider Notes (Signed)
CSN: [id number]     Arrival date & time 07/21/14  1927 History   First MD Initiated Contact with Patient 07/21/14 1953     Chief Complaint  Patient presents with  . Emesis     (Consider location/radiation/quality/duration/timing/severity/associated sxs/prior Treatment) HPI Comments: 49 year old female with irritable bowel syndrome, chronic diarrhea, anxiety, obesity presents with recurrent diarrhea and nausea for the past week. Patient was seen in the ER for similar however symptoms have worsened. No focal abdominal pain mild cramping. No recent antibiotics, patient has had C. difficile before. No fevers, patient has been told she might have a gluten intolerance. Patient has gastroenterology to follow-up with. Patient has been able to tolerate oral fluids. Symptoms intermittent  Patient is a 49 y.o. female presenting with vomiting. The history is provided by the patient.  Emesis Associated symptoms: diarrhea   Associated symptoms: no abdominal pain, no chills and no headaches     Past Medical History  Diagnosis Date  . Polycystic ovarian syndrome   . Colitis due to Clostridium difficile 2001  . Hepatic hemangioma   . Anxiety   . IBS (irritable bowel syndrome)   . History of Salmonella gastroenteritis   . GERD (gastroesophageal reflux disease)   . Rectal cancer     a. Followed by Dr. Gala Romney, dx 2000-2001. Tumor removed from rectal.  . Bipolar 1 disorder   . Migraine headache   . Asthma   . Arthritis   . Hypertension     a. Improved without intervention.  . Allergy   . Tobacco dependence   . Hyperlipidemia     a. Noted 02/2014.  . Morbid obesity   . Microscopic hematuria     Intermittent (no w/u done yet, as of 03/2014)  . Pruritic dermatitis 2015/2016    Primarily pruritic skin, but subtle rash as well (dermatitis herpetiformis?)  . Iron deficiency anemia 05/2014    per pt it is not due to vaginal blood loss; hemoccults sent to pt in mail 413/16.  . Vitamin D deficiency     Past Surgical History  Procedure Laterality Date  . Cholecystectomy    . Colonoscopy  05/05/2007    adenoma  . Situ removed  age 49    High-grade rectal adenoma removed from rectum   . Dilation and curettage of uterus      for vaginal bleeding  . Tubal ligation    . Cardiovascular stress test  03/24/14    Lexscan MIBI: mild anterior ischemia?  Cardiac CT angiogram recommended/done.  . Transthoracic echocardiogram  03/23/14    Normal  . Left heart catheterization with coronary angiogram N/A 03/31/2014    Normal coronaries, EF 37-62%, diastolic dysfunction.  Procedure: LEFT HEART CATHETERIZATION WITH CORONARY ANGIOGRAM;  Surgeon: Sinclair Grooms, MD;  Location: [facility] CATH LAB;  Service: Cardiovascular;  Laterality: N/A;  . Coronary ct angio  03/29/14    Two vessel dz/moderate stenosis of mid LAD and proximal RCA; cath recommended.   Family History  Problem Relation Age of Onset  . Colon cancer Father 28  . Cancer Father   . Diverticulitis Mother   . Heart disease Mother   . Hypertension Mother   . Hyperlipidemia Mother   . Mental illness Mother   . Diabetes Mother   . Heart attack Mother   . Stroke Neg Hx    History  Substance Use Topics  . Smoking status: Current Every Day Smoker -- 0.50 packs/day for 18 years    Types:  Cigarettes  . Smokeless tobacco: Never Used     Comment: 25 years  . Alcohol Use: No   OB History    Gravida Para Term Preterm AB TAB SAB Ectopic Multiple Living   4 1 1  3  3   1      Review of Systems  Constitutional: Positive for appetite change and fatigue. Negative for fever and chills.  HENT: Negative for congestion.   Eyes: Negative for visual disturbance.  Respiratory: Negative for shortness of breath.   Cardiovascular: Negative for chest pain.  Gastrointestinal: Positive for nausea, vomiting and diarrhea. Negative for abdominal pain.  Genitourinary: Negative for dysuria and flank pain.  Musculoskeletal: Negative for back pain, neck pain and  neck stiffness.  Skin: Negative for rash.  Neurological: Negative for light-headedness and headaches.      Allergies  Contrast media; Shellfish allergy; Amoxil; Codeine; Latex; Lipitor; and Permethrin  Home Medications   Prior to Admission medications   Medication Sig Start Date End Date Taking? Authorizing Provider  albuterol (VENTOLIN HFA) 108 (90 BASE) MCG/ACT inhaler Inhale 2 puffs into the lungs every 6 (six) hours as needed for wheezing or shortness of breath. 12/02/13  Yes Tammi Sou, MD  aspirin 81 MG chewable tablet Chew 1 tablet (81 mg total) by mouth daily. 09/06/12  Yes Ripley Fraise, MD  busPIRone (BUSPAR) 15 MG tablet Take 15 mg by mouth 3 (three) times daily.   Yes Historical Provider, MD  clonazePAM (KLONOPIN) 0.5 MG tablet 1-2 tabs po bid as needed for anxiety Patient taking differently: Take 1 mg by mouth 3 (three) times daily.  04/02/14  Yes Tammi Sou, MD  diphenhydrAMINE (BENADRYL) 25 mg capsule Take 50 mg by mouth every 6 (six) hours as needed for itching or allergies.   Yes Historical Provider, MD  ergocalciferol (VITAMIN D2) 50000 UNITS capsule Take 1 capsule (50,000 Units total) by mouth once a week. 07/01/14  Yes Tammi Sou, MD  ferrous sulfate 325 (65 FE) MG tablet Take 325 mg by mouth 2 (two) times daily with a meal.   Yes Historical Provider, MD  hydrocortisone cream 1 % Apply topically 3 (three) times daily. Patient taking differently: Apply 1 application topically daily as needed for itching.  03/25/14  Yes Eugenie Filler, MD  hydrOXYzine (ATARAX/VISTARIL) 25 MG tablet Take 25 mg by mouth 3 (three) times daily.    Yes Historical Provider, MD  lamoTRIgine (LAMICTAL) 25 MG tablet Take 50 mg by mouth at bedtime.    Yes Historical Provider, MD  nitroGLYCERIN (NITROSTAT) 0.4 MG SL tablet Place 1 tablet (0.4 mg total) under the tongue every 5 (five) minutes as needed for chest pain. 03/25/14  Yes Eugenie Filler, MD  pantoprazole (PROTONIX) 40 MG  tablet Take 1 tablet (40 mg total) by mouth daily. 04/29/14  Yes Tammi Sou, MD  Promethazine HCl (PHENERGAN PO) Take 1 tablet by mouth once as needed (FOR NAUSEA).   Yes Historical Provider, MD  ibuprofen (ADVIL,MOTRIN) 800 MG tablet Take 1 tablet (800 mg total) by mouth 3 (three) times daily. Patient not taking: Reported on 07/21/2014 06/28/14   Lily Kocher, PA-C  ondansetron (ZOFRAN) 4 MG tablet Take 1 tablet (4 mg total) by mouth every 8 (eight) hours as needed for nausea or vomiting. 07/16/14   Rolland Porter, MD   BP 131/77 mmHg  Pulse 82  Temp(Src) 98.4 F (36.9 C) (Oral)  Resp 16  Ht 5' (1.524 m)  Wt 232 lb (105.235  kg)  BMI 45.31 kg/m2  SpO2 95%  LMP 06/29/2014 Physical Exam  Constitutional: She is oriented to person, place, and time. She appears well-developed and well-nourished.  HENT:  Head: Normocephalic and atraumatic.  Mild dry mm  Eyes: Conjunctivae are normal. Right eye exhibits no discharge. Left eye exhibits no discharge.  Neck: Normal range of motion. Neck supple. No tracheal deviation present.  Cardiovascular: Normal rate and regular rhythm.   Pulmonary/Chest: Effort normal and breath sounds normal.  Abdominal: Soft. She exhibits no distension. There is no tenderness. There is no guarding.  Musculoskeletal: She exhibits no edema.  Neurological: She is alert and oriented to person, place, and time.  Skin: Skin is warm. No rash noted.  Psychiatric: She has a normal mood and affect.  Nursing note and vitals reviewed.   ED Course  Procedures (including critical care time) Labs Review Labs Reviewed  URINALYSIS, ROUTINE W REFLEX MICROSCOPIC - Abnormal; Notable for the following:    Specific Gravity, Urine >1.030 (*)    Hgb urine dipstick TRACE (*)    All other components within normal limits  URINE MICROSCOPIC-ADD ON - Abnormal; Notable for the following:    Squamous Epithelial / LPF MANY (*)    All other components within normal limits  CLOSTRIDIUM DIFFICILE  BY PCR  COMPREHENSIVE METABOLIC PANEL  CBC WITH DIFFERENTIAL/PLATELET  LIPASE, BLOOD  PREGNANCY, URINE    Imaging Review No results found.   EKG Interpretation None      MDM   Final diagnoses:  Nausea vomiting and diarrhea  Dehydration   Patient with known irritable bowel and chronic diarrhea presents with worsening of similar symptoms. On exam mild dehydration, blood work reviewed no acute findings. No focal abdominal pain on exam. Patient has outpatient follow-up. Patient had no episodes of diarrhea, unable to send C. difficile the patient to follow-up with primary doctor and gastroenterology.  Results and differential diagnosis were discussed with the patient/parent/guardian. Close follow up outpatient was discussed, comfortable with the plan.   Medications  fentaNYL (SUBLIMAZE) injection 50 mcg (50 mcg Intravenous Given 07/21/14 2101)  sodium chloride 0.9 % bolus 1,000 mL (0 mLs Intravenous Stopped 07/21/14 2145)  ondansetron (ZOFRAN) injection 4 mg (4 mg Intravenous Given 07/21/14 2101)  sodium chloride 0.9 % bolus 1,000 mL (1,000 mLs Intravenous New Bag/Given 07/21/14 2144)  ketorolac (TORADOL) 30 MG/ML injection 15 mg (15 mg Intravenous Given 07/21/14 2201)    Filed Vitals:   07/21/14 1937 07/21/14 2249  BP: 143/82 131/77  Pulse: 107 82  Temp: 98.8 F (37.1 C) 98.4 F (36.9 C)  TempSrc: Oral Oral  Resp: 20 16  Height: 5' (1.524 m)   Weight: 232 lb (105.235 kg)   SpO2: 98% 95%    Final diagnoses:  Nausea vomiting and diarrhea  Dehydration       Elnora Morrison, MD 07/21/14 [phone]

## 2014-07-26 ENCOUNTER — Encounter: Payer: Self-pay | Admitting: Nurse Practitioner

## 2014-07-26 ENCOUNTER — Telehealth: Payer: Self-pay

## 2014-07-26 ENCOUNTER — Other Ambulatory Visit: Payer: Self-pay

## 2014-07-26 ENCOUNTER — Ambulatory Visit (INDEPENDENT_AMBULATORY_CARE_PROVIDER_SITE_OTHER): Payer: 59 | Admitting: Nurse Practitioner

## 2014-07-26 VITALS — BP 139/84 | HR 101 | Temp 97.6°F | Ht 60.0 in | Wt 232.2 lb

## 2014-07-26 DIAGNOSIS — R110 Nausea: Secondary | ICD-10-CM | POA: Diagnosis not present

## 2014-07-26 DIAGNOSIS — R1084 Generalized abdominal pain: Secondary | ICD-10-CM | POA: Diagnosis not present

## 2014-07-26 DIAGNOSIS — R197 Diarrhea, unspecified: Secondary | ICD-10-CM

## 2014-07-26 DIAGNOSIS — Z85038 Personal history of other malignant neoplasm of large intestine: Secondary | ICD-10-CM | POA: Diagnosis not present

## 2014-07-26 DIAGNOSIS — R109 Unspecified abdominal pain: Secondary | ICD-10-CM | POA: Insufficient documentation

## 2014-07-26 MED ORDER — HYOSCYAMINE SULFATE 0.125 MG SL SUBL: 0.1250 mg | SUBLINGUAL_TABLET | SUBLINGUAL | Status: DC | PRN

## 2014-07-26 NOTE — Assessment & Plan Note (Signed)
Patient with a history of colon cancer and is overdue for a colonoscopy by about 2 years. Given this and her other symptoms we'll proceed with a colonoscopy.  Proceed with TCS with Dr. Gala Romney in near future: the risks, benefits, and alternatives have been discussed with the patient in detail. The patient states understanding and desires to proceed.  Patient is not on any anticoagulants other than 81 mg daily aspirin, no diabetes medications, no chronic pain medications. Denies alcohol and drug use.  Of note the patient states she is unable to tolerate typical anesthesia, and last colonoscopy was completed with Versed and fentanyl without incident. We will also add 25 mg Phenergan preprocedure due to history of anesthesia induced nausea, and daily antianxiety medication.

## 2014-07-26 NOTE — Progress Notes (Signed)
CC'ED TO PCP 

## 2014-07-26 NOTE — Progress Notes (Signed)
Primary Care Physician:  Tammi Sou, MD Primary Gastroenterologist:  Dr. Gala Romney  Chief Complaint  Patient presents with  . Diarrhea    HPI:   37 Martha Espinoza female presents for persistent diarrhea. Has a history of high-grade rectal adenoma. Per previous office visit in 2012 patient is due for a colonoscopy in 2014, no notes seen on our records indicating this was completed. Last colonoscopy 05/18/2002 noted normal rectum, normal colon, normal terminal ileum and repeat colonoscopy recommended for 10 years. Labwork provide about PCP reviewed and essentially normal. Patient has recently been to the emergency room on multiple occasions for these symptoms, last visit was unable to provide a stool sample for C. difficile test. Serum test for celiac disease negative. Of note patient has a history of C. Difficile.  Today she states her current symptoms have been going on for a few months. Is also having abdominal pain about 4/10 generalized and described as crampy. Also having nausea, has had a couple episodes of vomiting as well. Denies hematemesis. Last antibiotic was Amoxicillin about 1 month ago for possible sinus infection. Her diarrhea has been watery as of late and occurs every time she eats and drinks, but is unable to quantify even a remote estimate. Has not taking any medicine for her diarrhea. Has not had a colonoscopy since 2004. Denies hematochezia and melena. Is taking iron. Labs in the ER including CBC, CMP, and Lipase were normal. Admits constant chills, denies fever. Also admits weakness, fatigue. Has had objective weight loss of about 8 pounds in the past 2-3 weeks.   Past Medical History  Diagnosis Date  . Polycystic ovarian syndrome   . Colitis due to Clostridium difficile 2001  . Hepatic hemangioma   . Anxiety   . IBS (irritable bowel syndrome)   . History of Salmonella gastroenteritis   . GERD (gastroesophageal reflux disease)   . Rectal cancer     a. Followed by Dr. Gala Romney,  dx 2000-2001. Tumor removed from rectal.  . Bipolar 1 disorder   . Migraine headache   . Asthma   . Arthritis   . Hypertension     a. Improved without intervention.  . Allergy   . Tobacco dependence   . Hyperlipidemia     a. Noted 02/2014.  . Morbid obesity   . Microscopic hematuria     Intermittent (no w/u done yet, as of 03/2014)  . Pruritic dermatitis 2015/2016    Primarily pruritic skin, but subtle rash as well (dermatitis herpetiformis?)  . Iron deficiency anemia 05/2014    per pt it is not due to vaginal blood loss; hemoccults sent to pt in mail 413/16.  . Vitamin D deficiency     Past Surgical History  Procedure Laterality Date  . Cholecystectomy    . Colonoscopy  05/05/2007    adenoma  . Situ removed  age 18    High-grade rectal adenoma removed from rectum   . Dilation and curettage of uterus      for vaginal bleeding  . Tubal ligation    . Cardiovascular stress test  03/24/14    Lexscan MIBI: mild anterior ischemia?  Cardiac CT angiogram recommended/done.  . Transthoracic echocardiogram  03/23/14    Normal  . Left heart catheterization with coronary angiogram N/A 03/31/2014    Normal coronaries, EF 40-08%, diastolic dysfunction.  Procedure: LEFT HEART CATHETERIZATION WITH CORONARY ANGIOGRAM;  Surgeon: Sinclair Grooms, MD;  Location: [facility] CATH LAB;  Service: Cardiovascular;  Laterality: N/A;  .  Coronary ct angio  03/29/14    Two vessel dz/moderate stenosis of mid LAD and proximal RCA; cath recommended.    Current Outpatient Prescriptions  Medication Sig Dispense Refill  . albuterol (VENTOLIN HFA) 108 (90 BASE) MCG/ACT inhaler Inhale 2 puffs into the lungs every 6 (six) hours as needed for wheezing or shortness of breath. 1 Inhaler 1  . aspirin 81 MG chewable tablet Chew 1 tablet (81 mg total) by mouth daily. 14 tablet 0  . busPIRone (BUSPAR) 15 MG tablet Take 15 mg by mouth 3 (three) times daily.    . clonazePAM (KLONOPIN) 0.5 MG tablet 1-2 tabs po bid as needed for  anxiety (Patient taking differently: Take 1 mg by mouth 3 (three) times daily. ) 60 tablet 5  . diphenhydrAMINE (BENADRYL) 25 mg capsule Take 50 mg by mouth every 6 (six) hours as needed for itching or allergies.    Marland Kitchen ergocalciferol (VITAMIN D2) 50000 UNITS capsule Take 1 capsule (50,000 Units total) by mouth once a week. 12 capsule 0  . ferrous sulfate 325 (65 FE) MG tablet Take 325 mg by mouth 2 (two) times daily with a meal.    . hydrocortisone cream 1 % Apply topically 3 (three) times daily. (Patient taking differently: Apply 1 application topically daily as needed for itching. ) 30 g 0  . hydrOXYzine (ATARAX/VISTARIL) 25 MG tablet Take 25 mg by mouth 3 (three) times daily.     Marland Kitchen ibuprofen (ADVIL,MOTRIN) 800 MG tablet Take 1 tablet (800 mg total) by mouth 3 (three) times daily. 21 tablet 0  . lamoTRIgine (LAMICTAL) 25 MG tablet Take 50 mg by mouth at bedtime.     . nitroGLYCERIN (NITROSTAT) 0.4 MG SL tablet Place 1 tablet (0.4 mg total) under the tongue every 5 (five) minutes as needed for chest pain. 20 tablet 0  . ondansetron (ZOFRAN) 4 MG tablet Take 1 tablet (4 mg total) by mouth every 8 (eight) hours as needed for nausea or vomiting. 10 tablet 0  . pantoprazole (PROTONIX) 40 MG tablet Take 1 tablet (40 mg total) by mouth daily. 30 tablet 3  . Promethazine HCl (PHENERGAN PO) Take 1 tablet by mouth once as needed (FOR NAUSEA).    . [DISCONTINUED] citalopram (CELEXA) 40 MG tablet Take 20 mg by mouth at bedtime.     . [DISCONTINUED] omeprazole (PRILOSEC) 20 MG capsule Take 40 mg by mouth daily.     No current facility-administered medications for this visit.    Allergies as of 07/26/2014 - Review Complete 07/26/2014  Allergen Reaction Noted  . Contrast media [iodinated diagnostic agents] Anaphylaxis 03/31/2014  . Shellfish allergy Anaphylaxis 11/15/2010  . Amoxil [amoxicillin] Swelling 06/29/2014  . Codeine Nausea And Vomiting   . Latex Hives 04/24/2011  . Lipitor [atorvastatin] Itching  04/18/2014  . Permethrin Itching 04/18/2014    Family History  Problem Relation Age of Onset  . Colon cancer Father 31  . Cancer Father   . Diverticulitis Mother   . Heart disease Mother   . Hypertension Mother   . Hyperlipidemia Mother   . Mental illness Mother   . Diabetes Mother   . Heart attack Mother   . Stroke Neg Hx     History   Social History  . Marital Status: Legally Separated    Spouse Name: N/A  . Number of Children: 1  . Years of Education: N/A   Occupational History  . own insurance [employer]    Social History Main Topics  .  Smoking status: Current Every Day Smoker -- 0.50 packs/day for 18 years    Types: Cigarettes  . Smokeless tobacco: Never Used     Comment: 25 years  . Alcohol Use: No  . Drug Use: No  . Sexual Activity: No   Other Topics Concern  . Not on file   Social History Narrative    Review of Systems: General: Negative for anorexia, weight loss, fever, chills, fatigue, weakness. Eyes: Negative for vision changes.  ENT: Negative for hoarseness, difficulty swallowing , nasal congestion. CV: Negative for chest pain, angina, palpitations, dyspnea on exertion, peripheral edema.  Respiratory: Negative for dyspnea at rest, dyspnea on exertion, cough, sputum, wheezing.  GI: See history of present illness. GU:  Negative for dysuria, hematuria, urinary incontinence, urinary frequency, nocturnal urination.  MS: Negative for joint pain, low back pain.  Derm: Negative for rash or itching.  Neuro: Negative for weakness, abnormal sensation, seizure, frequent headaches, memory loss, confusion.  Psych: Negative for anxiety, depression, suicidal ideation, hallucinations.  Endo: Negative for unusual weight change.  Heme: Negative for bruising or bleeding. Allergy: Negative for rash or hives.    Physical Exam: BP 139/84 mmHg  Pulse 101  Temp(Src) 97.6 F (36.4 C) (Oral)  Ht 5' (1.524 m)  Wt 232 lb 3.2 oz (105.325 kg)  BMI 45.35 kg/m2   LMP 06/29/2014 General:   Alert and oriented. Pleasant and cooperative. Well-nourished and well-developed.  Head:  Normocephalic and atraumatic. Eyes:  Without icterus, sclera clear and conjunctiva pink.  Ears:  Normal auditory acuity. Nose:  No deformity, discharge,  or lesions. Mouth:  No deformity or lesions, oral mucosa pink.  Neck:  Supple, without mass or thyromegaly. Lungs:  Clear to auscultation bilaterally. No wheezes, rales, or rhonchi. No distress.  Heart:  S1, S2 present without murmurs appreciated.  Abdomen:  +BS, soft, non-tender and non-distended. No HSM noted. No guarding or rebound. No masses appreciated.  Rectal:  Deferred  Msk:  Symmetrical without gross deformities. Normal posture. Pulses:  Normal pulses noted. Extremities:  Without clubbing or edema. Neurologic:  Alert and  oriented x4;  grossly normal neurologically. Skin:  Intact without significant lesions or rashes. Cervical Nodes:  No significant cervical adenopathy. Psych:  Alert and cooperative. Normal mood and affect.     07/26/2014 8:48 AM

## 2014-07-26 NOTE — Assessment & Plan Note (Addendum)
49 year old female with a history of colon cancer, IBS, and C. difficile infection. Has had several months of progressive, intermittent diarrhea. Has been to the emergency room several times tested negative for tissue transglutaminase IgA, CBC, CMP, lipase all normal on last ER visit. Was unable to produce a stool sample at that time. She does not appear toxic at this time. Is overdue for her surveillance colonoscopy. Today we'll provide her with cups to collect a stool sample in take to the lab, add a daily biopsy take, Levsin sublingual for symptomatic diarrhea. We will also proceed with a colonoscopy. This could simply be a recurrence of her IBS, however cannot rule out C. difficile colitis given her recent antibiotics and history of C. difficile. Also cannot rule out more insidious process that she has a history of colon cancer.  Of note the patient states she is unable to tolerate typical anesthesia, and last colonoscopy was completed with Versed and fentanyl without incident. We will also add 25 mg Phenergan preprocedure due to history of anesthesia induced nausea, and daily antianxiety medication.  Proceed with TCS with Dr. Gala Romney in near future: the risks, benefits, and alternatives have been discussed with the patient in detail. The patient states understanding and desires to proceed.  Patient is not on any anticoagulants other than 81 mg daily aspirin, no diabetes medications, no chronic pain medications. Denies alcohol and drug use.

## 2014-07-26 NOTE — Assessment & Plan Note (Signed)
Abdominal pain associated with diarrhea, no fever, no hematochezia or melena. Denies any other red flag/warning signs or symptoms. Abdominal pain is cramping and associated with diarrhea which is been intermittent yet progressing for the past several months. Of note she has recently had amoxicillin for possible sinus infection within the past month. Has a history of C. difficile. Also has a history of IBS. This is possibly just a recurrence of her IBS, however cannot rule out C. difficile infection, or more insidious process that she has a history of colon cancer and is overdue for her surveillance colonoscopy.  Proceed with TCS with Dr. Gala Romney in near future: the risks, benefits, and alternatives have been discussed with the patient in detail. The patient states understanding and desires to proceed.  Patient is not on any anticoagulants other than 81 mg daily aspirin, no diabetes medications, no chronic pain medications. Denies alcohol and drug use.  Of note the patient states she is unable to tolerate typical anesthesia, and last colonoscopy was completed with Versed and fentanyl without incident. We will also add 25 mg Phenergan preprocedure due to history of anesthesia induced nausea, and daily antianxiety medication.

## 2014-07-26 NOTE — Patient Instructions (Signed)
1. Please collect her stool samples and ran into labs significant. 2. I send in a prescription for year diarrhea medication to the pharmacy. 3. Start taking a daily probiotic, he can consult with the pharmacist to take and agent that she will not be allergic to and is most likely to be effective. 4. We will schedule you for your colonoscopy. 5. Further recommendations to be based on the results of your procedure. 6. Return for follow-up in 6-8 weeks

## 2014-07-26 NOTE — Telephone Encounter (Signed)
Pt states that she had a different prep for her colonoscopy last time. States she took magnesium citrate and something else she couldn't remember. Please advise. Spot is being held on the schedule for 08/16/2014 @ 10:30 am.

## 2014-07-26 NOTE — Addendum Note (Signed)
Addended by: Gordy Levan, ERIC A on: 07/26/2014 09:32 AM   Modules accepted: Orders

## 2014-07-26 NOTE — Assessment & Plan Note (Signed)
Patient with nausea but without vomiting associated with her abdominal pain and intermittent yet progressively worsening diarrhea over the past several months. Has a history of C. difficile colitis as well as IBS. Also has a history of colon cancer and is overdue for her surveillance colonoscopy. Labs in the emergency room are normal. Unable to provide a stool sample in the emergency room. Today we will get stool samples as well as plan for her surveillance colonoscopy.  Proceed with TCS with Dr. Gala Romney in near future: the risks, benefits, and alternatives have been discussed with the patient in detail. The patient states understanding and desires to proceed.  Patient is not on any anticoagulants other than 81 mg daily aspirin, no diabetes medications, no chronic pain medications. Denies alcohol and drug use.  Of note the patient states she is unable to tolerate typical anesthesia, and last colonoscopy was completed with Versed and fentanyl without incident. We will also add 25 mg Phenergan preprocedure due to history of anesthesia induced nausea, and daily antianxiety medication.

## 2014-07-28 LAB — GIARDIA ANTIGEN: Giardia Screen (EIA): NEGATIVE

## 2014-07-28 LAB — CLOSTRIDIUM DIFFICILE BY PCR: Toxigenic C. Difficile by PCR: NOT DETECTED

## 2014-07-31 LAB — STOOL CULTURE

## 2014-08-03 ENCOUNTER — Other Ambulatory Visit: Payer: Self-pay

## 2014-08-03 DIAGNOSIS — R194 Change in bowel habit: Secondary | ICD-10-CM

## 2014-08-03 DIAGNOSIS — Z85038 Personal history of other malignant neoplasm of large intestine: Secondary | ICD-10-CM

## 2014-08-03 MED ORDER — NA SULFATE-K SULFATE-MG SULF 17.5-3.13-1.6 GM/177ML PO SOLN: 1.0000 | Freq: Once | ORAL | Status: AC

## 2014-08-06 NOTE — Telephone Encounter (Signed)
Pt was prescribed Suprep per verbal order by Walden Field, NP

## 2014-08-16 ENCOUNTER — Ambulatory Visit (HOSPITAL_COMMUNITY)
Admission: RE | Admit: 2014-08-16 | Discharge: 2014-08-16 | Disposition: A | Discharge status: Home / Self Care | Payer: 59 | Source: Ambulatory Visit | Attending: Internal Medicine | Admitting: Internal Medicine

## 2014-08-16 ENCOUNTER — Encounter (HOSPITAL_COMMUNITY)
Admission: RE | Disposition: A | Discharge status: Home / Self Care | Payer: Self-pay | Source: Ambulatory Visit | Attending: Internal Medicine

## 2014-08-16 ENCOUNTER — Telehealth: Payer: Self-pay

## 2014-08-16 ENCOUNTER — Encounter (HOSPITAL_COMMUNITY): Payer: Self-pay | Admitting: *Deleted

## 2014-08-16 DIAGNOSIS — Z85038 Personal history of other malignant neoplasm of large intestine: Secondary | ICD-10-CM | POA: Insufficient documentation

## 2014-08-16 DIAGNOSIS — R194 Change in bowel habit: Secondary | ICD-10-CM

## 2014-08-16 DIAGNOSIS — Z5309 Procedure and treatment not carried out because of other contraindication: Secondary | ICD-10-CM | POA: Diagnosis not present

## 2014-08-16 SURGERY — CANCELLED PROCEDURE

## 2014-08-16 MED ORDER — PROMETHAZINE HCL 25 MG/ML IJ SOLN: 25.0000 mg | Freq: Once | INTRAMUSCULAR | Status: DC

## 2014-08-16 MED ORDER — SODIUM CHLORIDE 0.9 % IV SOLN: INTRAVENOUS | Status: DC

## 2014-08-16 MED ORDER — SODIUM CHLORIDE 0.9 % IJ SOLN
INTRAMUSCULAR | Status: AC
  Filled 2014-08-16: qty 3

## 2014-08-16 MED ORDER — PROMETHAZINE HCL 25 MG/ML IJ SOLN
INTRAMUSCULAR | Status: AC
  Filled 2014-08-16: qty 1

## 2014-08-16 NOTE — Telephone Encounter (Signed)
We can discuss prep option during her visit. We do not do pill preps only. There are lower volume preps that can be attempted.

## 2014-08-16 NOTE — Telephone Encounter (Signed)
Pt is schedule for an office visit on 08/31/14 @ 10:30  With EG. She does not know what we can do because she can not drink any of the preps.

## 2014-08-16 NOTE — Progress Notes (Signed)
Patient interviewed by short stay staff. She admitted she did not take her prescribed prep. Only 6 Ducalox tablets. States she could not take any "liquid prep". She did not call anybody over the past weekend.  I explained the patient the importance of a good prep particularly with her history. She was encouraged to call the office and we will reschedule.

## 2014-08-16 NOTE — Telephone Encounter (Signed)
Pt is aware that we don't do pill preps

## 2014-08-16 NOTE — OR Nursing (Signed)
Patient in for a colonoscopy. Stated she took 6 Dulcolax, and 6 stool softeners for prep. Patient stated, "this is what I always take when I have this done." Dr. Gala Romney notified and cancelled the procedure for today. Patient instructed to call Dr. Roseanne Kaufman office to reschedule an appointment to discuss prep options. Patient given Dr. Roseanne Kaufman office phone number and stated, "I don't need it because I'm not having it done." Explained the importance of patient following up and having the procedure done." Patient discharged with mother and friend.

## 2014-08-20 ENCOUNTER — Encounter: Payer: Self-pay | Admitting: Family Medicine

## 2014-08-24 ENCOUNTER — Encounter (HOSPITAL_COMMUNITY): Payer: Self-pay | Admitting: Emergency Medicine

## 2014-08-24 ENCOUNTER — Emergency Department (HOSPITAL_COMMUNITY)
Admission: EM | Admit: 2014-08-24 | Discharge: 2014-08-24 | Disposition: A | Discharge status: Home / Self Care | Payer: 59 | Attending: Emergency Medicine | Admitting: Emergency Medicine

## 2014-08-24 DIAGNOSIS — Z79899 Other long term (current) drug therapy: Secondary | ICD-10-CM | POA: Diagnosis not present

## 2014-08-24 DIAGNOSIS — M1990 Unspecified osteoarthritis, unspecified site: Secondary | ICD-10-CM | POA: Insufficient documentation

## 2014-08-24 DIAGNOSIS — J45909 Unspecified asthma, uncomplicated: Secondary | ICD-10-CM | POA: Diagnosis not present

## 2014-08-24 DIAGNOSIS — K219 Gastro-esophageal reflux disease without esophagitis: Secondary | ICD-10-CM | POA: Insufficient documentation

## 2014-08-24 DIAGNOSIS — F319 Bipolar disorder, unspecified: Secondary | ICD-10-CM | POA: Diagnosis not present

## 2014-08-24 DIAGNOSIS — Z720 Tobacco use: Secondary | ICD-10-CM | POA: Insufficient documentation

## 2014-08-24 DIAGNOSIS — D649 Anemia, unspecified: Secondary | ICD-10-CM | POA: Diagnosis not present

## 2014-08-24 DIAGNOSIS — Z7951 Long term (current) use of inhaled steroids: Secondary | ICD-10-CM | POA: Diagnosis not present

## 2014-08-24 DIAGNOSIS — J329 Chronic sinusitis, unspecified: Secondary | ICD-10-CM | POA: Diagnosis not present

## 2014-08-24 DIAGNOSIS — Z86018 Personal history of other benign neoplasm: Secondary | ICD-10-CM | POA: Diagnosis not present

## 2014-08-24 DIAGNOSIS — Z91040 Latex allergy status: Secondary | ICD-10-CM | POA: Diagnosis not present

## 2014-08-24 DIAGNOSIS — Z872 Personal history of diseases of the skin and subcutaneous tissue: Secondary | ICD-10-CM | POA: Diagnosis not present

## 2014-08-24 DIAGNOSIS — I10 Essential (primary) hypertension: Secondary | ICD-10-CM | POA: Diagnosis not present

## 2014-08-24 DIAGNOSIS — Z880 Allergy status to penicillin: Secondary | ICD-10-CM | POA: Insufficient documentation

## 2014-08-24 DIAGNOSIS — G43909 Migraine, unspecified, not intractable, without status migrainosus: Secondary | ICD-10-CM | POA: Diagnosis not present

## 2014-08-24 DIAGNOSIS — F419 Anxiety disorder, unspecified: Secondary | ICD-10-CM | POA: Insufficient documentation

## 2014-08-24 DIAGNOSIS — Z8619 Personal history of other infectious and parasitic diseases: Secondary | ICD-10-CM | POA: Diagnosis not present

## 2014-08-24 DIAGNOSIS — Z7982 Long term (current) use of aspirin: Secondary | ICD-10-CM | POA: Insufficient documentation

## 2014-08-24 DIAGNOSIS — R51 Headache: Secondary | ICD-10-CM | POA: Diagnosis present

## 2014-08-24 MED ORDER — ONDANSETRON 8 MG PO TBDP
8.0000 mg | ORAL_TABLET | Freq: Once | ORAL | Status: AC
  Administered 2014-08-24: 8 mg via ORAL
  Filled 2014-08-24: qty 1

## 2014-08-24 MED ORDER — PROMETHAZINE HCL 25 MG PO TABS: 25.0000 mg | ORAL_TABLET | Freq: Four times a day (QID) | ORAL | Status: DC | PRN

## 2014-08-24 MED ORDER — FLUTICASONE PROPIONATE 50 MCG/ACT NA SUSP: 2.0000 | Freq: Every day | NASAL | Status: DC

## 2014-08-24 MED ORDER — CETIRIZINE HCL 10 MG PO CAPS: 10.0000 mg | ORAL_CAPSULE | Freq: Every day | ORAL | Status: DC

## 2014-08-24 MED ORDER — KETOROLAC TROMETHAMINE 30 MG/ML IJ SOLN
60.0000 mg | Freq: Once | INTRAMUSCULAR | Status: AC
  Administered 2014-08-24: 60 mg via INTRAMUSCULAR
  Filled 2014-08-24: qty 2

## 2014-08-24 MED ORDER — DEXAMETHASONE 4 MG PO TABS
10.0000 mg | ORAL_TABLET | Freq: Once | ORAL | Status: AC
  Administered 2014-08-24: 10 mg via ORAL
  Filled 2014-08-24: qty 3

## 2014-08-24 NOTE — ED Notes (Signed)
Pt c/o congestion and headache that started this am.

## 2014-08-24 NOTE — Discharge Instructions (Signed)
You may alternate between Tylenol 1000 mg every 6 hours as needed for fever and pain and ibuprofen 800 mg every 8 hours as needed for fever and pain. I do not feel you need to be on antibiotics. This may take 1-2 weeks to completely resolve.  Sinusitis Sinusitis is redness, soreness, and inflammation of the paranasal sinuses. Paranasal sinuses are air pockets within the bones of your face (beneath the eyes, the middle of the forehead, or above the eyes). In healthy paranasal sinuses, mucus is able to drain out, and air is able to circulate through them by way of your nose. However, when your paranasal sinuses are inflamed, mucus and air can become trapped. This can allow bacteria and other germs to grow and cause infection. Sinusitis can develop quickly and last only a short time (acute) or continue over a long period (chronic). Sinusitis that lasts for more than 12 weeks is considered chronic.  CAUSES  Causes of sinusitis include:  Allergies.  Structural abnormalities, such as displacement of the cartilage that separates your nostrils (deviated septum), which can decrease the air flow through your nose and sinuses and affect sinus drainage.  Functional abnormalities, such as when the small hairs (cilia) that line your sinuses and help remove mucus do not work properly or are not present. SIGNS AND SYMPTOMS  Symptoms of acute and chronic sinusitis are the same. The primary symptoms are pain and pressure around the affected sinuses. Other symptoms include:  Upper toothache.  Earache.  Headache.  Bad breath.  Decreased sense of smell and taste.  A cough, which worsens when you are lying flat.  Fatigue.  Fever.  Thick drainage from your nose, which often is green and may contain pus (purulent).  Swelling and warmth over the affected sinuses. DIAGNOSIS  Your health care provider will perform a physical exam. During the exam, your health care provider may:  Look in your nose for  signs of abnormal growths in your nostrils (nasal polyps).  Tap over the affected sinus to check for signs of infection.  View the inside of your sinuses (endoscopy) using an imaging device that has a light attached (endoscope). If your health care provider suspects that you have chronic sinusitis, one or more of the following tests may be recommended:  Allergy tests.  Nasal culture. A sample of mucus is taken from your nose, sent to a lab, and screened for bacteria.  Nasal cytology. A sample of mucus is taken from your nose and examined by your health care provider to determine if your sinusitis is related to an allergy. TREATMENT  Most cases of acute sinusitis are related to a viral infection and will resolve on their own within 10 days. Sometimes medicines are prescribed to help relieve symptoms (pain medicine, decongestants, nasal steroid sprays, or saline sprays).  However, for sinusitis related to a bacterial infection, your health care provider will prescribe antibiotic medicines. These are medicines that will help kill the bacteria causing the infection.  Rarely, sinusitis is caused by a fungal infection. In theses cases, your health care provider will prescribe antifungal medicine. For some cases of chronic sinusitis, surgery is needed. Generally, these are cases in which sinusitis recurs more than 3 times per year, despite other treatments. HOME CARE INSTRUCTIONS   Drink plenty of water. Water helps thin the mucus so your sinuses can drain more easily.  Use a humidifier.  Inhale steam 3 to 4 times a day (for example, sit in the bathroom with the shower  running).  Apply a warm, moist washcloth to your face 3 to 4 times a day, or as directed by your health care provider.  Use saline nasal sprays to help moisten and clean your sinuses.  Take medicines only as directed by your health care provider.  If you were prescribed either an antibiotic or antifungal medicine, finish it all  even if you start to feel better. SEEK IMMEDIATE MEDICAL CARE IF:  You have increasing pain or severe headaches.  You have nausea, vomiting, or drowsiness.  You have swelling around your face.  You have vision problems.  You have a stiff neck.  You have difficulty breathing. MAKE SURE YOU:   Understand these instructions.  Will watch your condition.  Will get help right away if you are not doing well or get worse. Document Released: 03/12/2005 Document Revised: 07/27/2013 Document Reviewed: 03/27/2011 [facility] [employer] Patient Information 2015 [place], [place]. This information is not intended to replace advice given to you by your health care provider. Make sure you discuss any questions you have with your health care provider.

## 2014-08-24 NOTE — ED Provider Notes (Signed)
TIME SEEN: 4:30 AM  CHIEF COMPLAINT: Sinus congestion, headache  HPI: Pt is a 49 y.o. female with history of PCOS, IBS, migraines, bipolar disorder who presents to the emergency department with complaints of 2 days of nasal congestion, dry cough, sinus pressure, headache. Reports she's been losing her voice is not feeling well. Has had subjective fevers. No recent travel or sick contacts. Has chronic vomiting and diarrhea. No numbness currently or focal weakness. Is not on anticoagulation. No head injury. No neck pain or neck stiffness. States this feels different than her prior headaches.  States she feels a pressure behind her eyes, and her forehead and in her cheeks. Also is having bilateral ear pain. States that this feels different than her migraine headaches. It was gradual onset, not severe.  ROS: See HPI Constitutional: Subjective fever  Eyes: no drainage  ENT: no runny nose   Cardiovascular:  no chest pain  Resp: no SOB  GI: Chronic vomiting and diarrhea GU: no dysuria Integumentary: no rash  Allergy: no hives  Musculoskeletal: no leg swelling  Neurological: no slurred speech ROS otherwise negative  PAST MEDICAL HISTORY/PAST SURGICAL HISTORY:  Past Medical History  Diagnosis Date  . Polycystic ovarian syndrome   . Colitis due to Clostridium difficile 2001  . Hepatic hemangioma   . Anxiety   . IBS (irritable bowel syndrome)   . History of Salmonella gastroenteritis   . GERD (gastroesophageal reflux disease)   . Rectal cancer     a. Followed by Dr. Gala Romney, dx 2000-2001. Tumor removed from rectal.  . Bipolar 1 disorder   . Migraine headache   . Asthma   . Arthritis   . Hypertension     a. Improved without intervention.  . Allergy   . Tobacco dependence   . Hyperlipidemia     a. Noted 02/2014.  . Morbid obesity   . Microscopic hematuria     Intermittent (no w/u done yet, as of 03/2014)  . Pruritic dermatitis 2015/2016    +Scabies prep at [facility] 07/2014.  (Primarily pruritic skin, but eventually a subtle rash as well)  . Iron deficiency anemia 05/2014    per pt it is not due to vaginal blood loss; hemoccults sent to pt in mail 413/16.  . Vitamin D deficiency     MEDICATIONS:  Prior to Admission medications   Medication Sig Start Date End Date Taking? Authorizing Provider  albuterol (VENTOLIN HFA) 108 (90 BASE) MCG/ACT inhaler Inhale 2 puffs into the lungs every 6 (six) hours as needed for wheezing or shortness of breath. 12/02/13   Tammi Sou, MD  aspirin 81 MG chewable tablet Chew 1 tablet (81 mg total) by mouth daily. 09/06/12   Ripley Fraise, MD  busPIRone (BUSPAR) 15 MG tablet Take 15 mg by mouth 3 (three) times daily.    Historical Provider, MD  clonazePAM (KLONOPIN) 0.5 MG tablet 1-2 tabs po bid as needed for anxiety Patient taking differently: Take 1 mg by mouth 3 (three) times daily.  04/02/14   Tammi Sou, MD  diphenhydrAMINE (BENADRYL) 25 mg capsule Take 50 mg by mouth at bedtime.     Historical Provider, MD  diphenoxylate-atropine (LOMOTIL) 2.5-0.025 MG per tablet Take 1 tablet by mouth 4 (four) times daily as needed for diarrhea or loose stools.    Historical Provider, MD  ergocalciferol (VITAMIN D2) 50000 UNITS capsule Take 1 capsule (50,000 Units total) by mouth once a week. 07/01/14   Tammi Sou, MD  ferrous  sulfate 325 (65 FE) MG tablet Take 325 mg by mouth 2 (two) times daily with a meal.    Historical Provider, MD  hydrocortisone cream 1 % Apply topically 3 (three) times daily. Patient taking differently: Apply 1 application topically daily as needed for itching.  03/25/14   Eugenie Filler, MD  hydrOXYzine (ATARAX/VISTARIL) 25 MG tablet Take 25 mg by mouth 3 (three) times daily.     Historical Provider, MD  hyoscyamine (LEVSIN SL) 0.125 MG SL tablet Place 1 tablet (0.125 mg total) under the tongue every 4 (four) hours as needed. 07/26/14   Carlis Stable, NP  ibuprofen (ADVIL,MOTRIN) 800 MG tablet Take 1 tablet (800  mg total) by mouth 3 (three) times daily. 06/28/14   Lily Kocher, PA-C  lamoTRIgine (LAMICTAL) 100 MG tablet Take 150 mg by mouth at bedtime.     Historical Provider, MD  Na Sulfate-K Sulfate-Mg Sulf SOLN Take 1 Container by mouth once. 08/03/14 09/02/14  Carlis Stable, NP  nitroGLYCERIN (NITROSTAT) 0.4 MG SL tablet Place 1 tablet (0.4 mg total) under the tongue every 5 (five) minutes as needed for chest pain. 03/25/14   Eugenie Filler, MD  ondansetron (ZOFRAN) 4 MG tablet Take 1 tablet (4 mg total) by mouth every 8 (eight) hours as needed for nausea or vomiting. 07/16/14   Rolland Porter, MD  pantoprazole (PROTONIX) 40 MG tablet Take 1 tablet (40 mg total) by mouth daily. 04/29/14   Tammi Sou, MD    ALLERGIES:  Allergies  Allergen Reactions  . Contrast Media [Iodinated Diagnostic Agents] Anaphylaxis  . Shellfish Allergy Anaphylaxis  . Amoxil [Amoxicillin] Swelling    Facial swelling  . Codeine Nausea And Vomiting    Hallucinations, Also sees people that are not there   . Latex Hives  . Lipitor [Atorvastatin] Itching  . Permethrin Itching    SOCIAL HISTORY:  History  Substance Use Topics  . Smoking status: Current Every Day Smoker -- 0.50 packs/day for 18 years    Types: Cigarettes  . Smokeless tobacco: Never Used     Comment: 25 years  . Alcohol Use: No    FAMILY HISTORY: Family History  Problem Relation Age of Onset  . Colon cancer Father 60  . Cancer Father   . Diverticulitis Mother   . Heart disease Mother   . Hypertension Mother   . Hyperlipidemia Mother   . Mental illness Mother   . Diabetes Mother   . Heart attack Mother   . Stroke Neg Hx     EXAM: BP 138/61 mmHg  Pulse 98  Temp(Src) 98.2 F (36.8 C)  Resp 18  Ht 5' (1.524 m)  Wt 230 lb (104.327 kg)  BMI 44.92 kg/m2  SpO2 97%  LMP 08/09/2014 CONSTITUTIONAL: Alert and oriented and responds appropriately to questions. Well-appearing; well-nourished, nontoxic, well-hydrated appearing HEAD:  Normocephalic EYES: Conjunctivae clear, PERRL ENT: normal nose; no rhinorrhea; moist mucous membranes; pharynx without lesions noted, no tonsillar hypertrophy or exudate, no uvular deviation, no trismus or drooling, hoarse voice but no muffled voice, TMs are clear bilaterally with no cerumen impaction, no signs of mastoiditis NECK: Supple, no meningismus, no LAD  CARD: RRR; S1 and S2 appreciated; no murmurs, no clicks, no rubs, no gallops RESP: Normal chest excursion without splinting or tachypnea; breath sounds clear and equal bilaterally; no wheezes, no rhonchi, no rales, no hypoxia or respiratory distress, speaking full sentences ABD/GI: Normal bowel sounds; non-distended; soft, non-tender, no rebound, no guarding, no peritoneal signs  BACK:  The back appears normal and is non-tender to palpation, there is no CVA tenderness EXT: Normal ROM in all joints; non-tender to palpation; no edema; normal capillary refill; no cyanosis, no calf tenderness or swelling    SKIN: Normal color for age and race; warm NEURO: Moves all extremities equally, sensation to light touch intact diffusely, cranial nerves II through XII intact, normal gait PSYCH: The patient's mood and manner are appropriate. Grooming and personal hygiene are appropriate.  MEDICAL DECISION MAKING: Patient here with sinusitis likely from viral illness versus allergies. I do not feel she needs to be on antibiotics at this time. We'll give dose of Toradol and Decadron in the ED. Has chronic nausea, vomiting and diarrhea but appears well hydrated on exam and has no abdominal pain. Will give dose of Zofran. I do not feel she needs any further emergent workup at this time. Have advised her to increase fluid intake, use Zyrtec and Flonase at home and follow-up with her PCP if symptoms worsen or do not improve in 1-2 weeks. Discussed return precautions. She verbalized understanding and is comfortable with plan.       [place], DO 08/24/14  [phone]

## 2014-08-31 ENCOUNTER — Ambulatory Visit: Payer: 59 | Admitting: Nurse Practitioner

## 2014-09-02 ENCOUNTER — Other Ambulatory Visit: Payer: Self-pay | Admitting: Family Medicine

## 2014-09-10 ENCOUNTER — Other Ambulatory Visit: Payer: Self-pay | Admitting: Family Medicine

## 2014-09-10 NOTE — Telephone Encounter (Signed)
Dr. Anitra Lauth pt. RF request for LOV: 07/07/14 due for f/u next month.  Next ov: 10/06/14 Last written: 04/02/14 #60 w/ 5RF Please advise. Thanks.

## 2014-09-15 ENCOUNTER — Ambulatory Visit (INDEPENDENT_AMBULATORY_CARE_PROVIDER_SITE_OTHER): Payer: 59 | Admitting: Nurse Practitioner

## 2014-09-15 ENCOUNTER — Other Ambulatory Visit: Payer: Self-pay

## 2014-09-15 ENCOUNTER — Encounter: Payer: Self-pay | Admitting: Nurse Practitioner

## 2014-09-15 VITALS — BP 159/97 | HR 104 | Temp 97.0°F | Ht 60.0 in | Wt 236.8 lb

## 2014-09-15 DIAGNOSIS — Z85038 Personal history of other malignant neoplasm of large intestine: Secondary | ICD-10-CM | POA: Diagnosis not present

## 2014-09-15 DIAGNOSIS — R1084 Generalized abdominal pain: Secondary | ICD-10-CM | POA: Diagnosis not present

## 2014-09-15 MED ORDER — SOD PICOSULFATE-MAG OX-CIT ACD 10-3.5-12 MG-GM-GM PO PACK: 1.0000 | PACK | ORAL | Status: DC

## 2014-09-15 NOTE — Assessment & Plan Note (Addendum)
Patient continues with generalized abdominal pain. Also with nausea and occasional vomiting. Patient describes crampy. She states her pain is essentially unchanged from the last office visit. Last colonoscopy had to be canceled because of not completing prep. This point we'll proceed with colonoscopy as previously planned. Explained to patient we do not do pill preps and offered a low-volume prep, she states she'll just "try and get down the last one you gave me."  Of note the patient states she is unable to tolerate typical anesthesia, and last colonoscopy was completed with Versed and fentanyl without incident. We will also add 25 mg Phenergan preprocedure due to history of anesthesia induced nausea, and daily antianxiety medication.  Proceed with TCS with Dr. Gala Romney in near future: the risks, benefits, and alternatives have been discussed with the patient in detail. The patient states understanding and desires to proceed.  Patient is not on any anticoagulants other than 81 mg daily aspirin, no diabetes medications, no chronic pain medications. Denies alcohol and drug use.

## 2014-09-15 NOTE — Progress Notes (Signed)
Referring Provider: Tammi Sou, MD Primary Care Physician:  Tammi Sou, MD Primary GI:  Dr. Gala Romney  Chief Complaint  Patient presents with  . set up TCs    HPI:   49 year old female presents for office visit to reschedule colonoscopy. Previous colonoscopy was scheduled for 08/16/2014 which was canceled because she did not complete the prep. She states she previously has undergone pill preps and is not able to tolerate any liquid preps. We explained to her we do not do pill preps but there are lower volume options that we can consider. She has a history of rectal adenoma, was due for colonoscopy in 2014. Last office visit 07/26/2014 states she was having abdominal pain for a 10 generalized which is described as cramping as well as nausea, vomiting.  Today she states she is still having same symptoms including abdominal, nausea, and intermittent vomiting. Denies hematemesis, hematochezia, and melena. She is not having any new symptoms from last visit.  Past Medical History  Diagnosis Date  . Polycystic ovarian syndrome   . Colitis due to Clostridium difficile 2001  . Hepatic hemangioma   . Anxiety   . IBS (irritable bowel syndrome)   . History of Salmonella gastroenteritis   . GERD (gastroesophageal reflux disease)   . Rectal cancer     a. Followed by Dr. Gala Romney, dx 2000-2001. Tumor removed from rectal.  . Bipolar 1 disorder   . Migraine headache   . Asthma   . Arthritis   . Hypertension     a. Improved without intervention.  . Allergy   . Tobacco dependence   . Hyperlipidemia     a. Noted 02/2014.  . Morbid obesity   . Microscopic hematuria     Intermittent (no w/u done yet, as of 03/2014)  . Pruritic dermatitis 2015/2016    +Scabies prep at [facility] 07/2014. (Primarily pruritic skin, but eventually a subtle rash as well)  . Iron deficiency anemia 05/2014    per pt it is not due to vaginal blood loss; hemoccults sent to pt in mail 413/16.  . Vitamin  D deficiency     Past Surgical History  Procedure Laterality Date  . Cholecystectomy    . Colonoscopy  05/05/2007    adenoma  . Situ removed  age 38    High-grade rectal adenoma removed from rectum   . Dilation and curettage of uterus      for vaginal bleeding  . Tubal ligation    . Cardiovascular stress test  03/24/14    Lexscan MIBI: mild anterior ischemia?  Cardiac CT angiogram recommended/done.  . Transthoracic echocardiogram  03/23/14    Normal  . Left heart catheterization with coronary angiogram N/A 03/31/2014    Normal coronaries, EF 85-02%, diastolic dysfunction.  Procedure: LEFT HEART CATHETERIZATION WITH CORONARY ANGIOGRAM;  Surgeon: Sinclair Grooms, MD;  Location: [facility] CATH LAB;  Service: Cardiovascular;  Laterality: N/A;  . Coronary ct angio  03/29/14    Two vessel dz/moderate stenosis of mid LAD and proximal RCA; cath recommended.    Current Outpatient Prescriptions  Medication Sig Dispense Refill  . albuterol (VENTOLIN HFA) 108 (90 BASE) MCG/ACT inhaler Inhale 2 puffs into the lungs every 6 (six) hours as needed for wheezing or shortness of breath. 1 Inhaler 1  . aspirin 81 MG chewable tablet Chew 1 tablet (81 mg total) by mouth daily. 14 tablet 0  . busPIRone (BUSPAR) 15 MG tablet Take 15 mg by mouth 3 (three)  times daily.    . Cetirizine HCl (ZYRTEC ALLERGY) 10 MG CAPS Take 1 capsule (10 mg total) by mouth daily. 30 capsule 1  . clonazePAM (KLONOPIN) 0.5 MG tablet TAKE ONE TO TWO TABLETS BY MOUTH TWICE DAILY AS NEEDED FOR ANXIETY 60 tablet 0  . diphenhydrAMINE (BENADRYL) 25 mg capsule Take 50 mg by mouth at bedtime.     . diphenoxylate-atropine (LOMOTIL) 2.5-0.025 MG per tablet Take 1 tablet by mouth 4 (four) times daily as needed for diarrhea or loose stools.    . ergocalciferol (VITAMIN D2) 50000 UNITS capsule Take 1 capsule (50,000 Units total) by mouth once a week. 12 capsule 0  . ferrous sulfate 325 (65 FE) MG tablet Take 325 mg by mouth 2 (two) times daily with a  meal.    . fluticasone (FLONASE) 50 MCG/ACT nasal spray Place 2 sprays into both nostrils daily. 16 g 1  . hydrocortisone cream 1 % Apply topically 3 (three) times daily. (Patient taking differently: Apply 1 application topically daily as needed for itching. ) 30 g 0  . hydrOXYzine (ATARAX/VISTARIL) 25 MG tablet Take 25 mg by mouth 3 (three) times daily.     . hyoscyamine (LEVSIN SL) 0.125 MG SL tablet Place 1 tablet (0.125 mg total) under the tongue every 4 (four) hours as needed. 30 tablet 0  . ibuprofen (ADVIL,MOTRIN) 800 MG tablet Take 1 tablet (800 mg total) by mouth 3 (three) times daily. 21 tablet 0  . lamoTRIgine (LAMICTAL) 100 MG tablet Take 150 mg by mouth at bedtime.     . ondansetron (ZOFRAN) 4 MG tablet Take 1 tablet (4 mg total) by mouth every 8 (eight) hours as needed for nausea or vomiting. 10 tablet 0  . pantoprazole (PROTONIX) 40 MG tablet TAKE ONE TABLET BY MOUTH ONCE DAILY 30 tablet 0  . promethazine (PHENERGAN) 25 MG tablet Take 1 tablet (25 mg total) by mouth every 6 (six) hours as needed for nausea or vomiting. 10 tablet 0  . nitroGLYCERIN (NITROSTAT) 0.4 MG SL tablet Place 1 tablet (0.4 mg total) under the tongue every 5 (five) minutes as needed for chest pain. (Patient not taking: Reported on 09/15/2014) 20 tablet 0  . [DISCONTINUED] citalopram (CELEXA) 40 MG tablet Take 20 mg by mouth at bedtime.     . [DISCONTINUED] omeprazole (PRILOSEC) 20 MG capsule Take 40 mg by mouth daily.     No current facility-administered medications for this visit.    Allergies as of 09/15/2014 - Review Complete 09/15/2014  Allergen Reaction Noted  . Contrast media [iodinated diagnostic agents] Anaphylaxis 03/31/2014  . Shellfish allergy Anaphylaxis 11/15/2010  . Amoxil [amoxicillin] Swelling 06/29/2014  . Codeine Nausea And Vomiting   . Latex Hives 04/24/2011  . Lipitor [atorvastatin] Itching 04/18/2014  . Permethrin Itching 04/18/2014    Family History  Problem Relation Age of Onset    . Colon cancer Father 57  . Cancer Father   . Diverticulitis Mother   . Heart disease Mother   . Hypertension Mother   . Hyperlipidemia Mother   . Mental illness Mother   . Diabetes Mother   . Heart attack Mother   . Stroke Neg Hx     History   Social History  . Marital Status: Married    Spouse Name: N/A  . Number of Children: 1  . Years of Education: N/A   Occupational History  . own insurance co     Social History Main Topics  . Smoking status: Current Every  Day Smoker -- 0.50 packs/day for 18 years    Types: Cigarettes  . Smokeless tobacco: Never Used     Comment: 25 years  . Alcohol Use: No  . Drug Use: No  . Sexual Activity: No   Other Topics Concern  . None   Social History Narrative    Review of Systems: General: Negative for anorexia, weight loss, fever. Admits chills, fatigue, weakness. ENT: Negative for hoarseness, difficulty swallowing , nasal congestion. CV: Negative for chest pain, angina, palpitations, dyspnea on exertion, peripheral edema.  Respiratory: Negative for dyspnea at rest, dyspnea on exertion, cough, sputum, wheezing.  GI: See history of present illness. Endo: Negative for unusual weight change.  Heme: Negative for bruising or bleeding.   Physical Exam: BP 159/97 mmHg  Pulse 104  Temp(Src) 97 F (36.1 C)  Ht 5' (1.524 m)  Wt 236 lb 12.8 oz (107.412 kg)  BMI 46.25 kg/m2  LMP 08/31/2014 General:   Alert and oriented. Well-nourished and well-developed.  Head:  Normocephalic and atraumatic. Eyes:  Without icterus, sclera clear and conjunctiva pink.  Ears:  Normal auditory acuity. Cardiovascular:  S1, S2 present without murmurs appreciated. Normal pulses noted. Extremities without clubbing or edema. Respiratory:  Clear to auscultation bilaterally. No wheezes, rales, or rhonchi. No distress.  Gastrointestinal:  +BS, soft, rounded, and non-distended. Generalized TTP. No HSM noted. No guarding or rebound. No masses  appreciated.  Rectal:  Deferred  Neurologic:  Alert and oriented x4;  grossly normal neurologically. Psych:  Alert and cooperative. Normal mood and affect. Heme/Lymph/Immune: No excessive bruising noted.    09/15/2014 1:45 PM

## 2014-09-15 NOTE — Progress Notes (Signed)
cc'd to pcp 

## 2014-09-15 NOTE — Assessment & Plan Note (Addendum)
Patient with a history of colon cancer and is overdue for a colonoscopy by about 2 years. Given this and her other symptoms we'll proceed with a colonoscopy.  Of note the patient states she is unable to tolerate typical anesthesia, and last colonoscopy was completed with Versed and fentanyl without incident. We will also add 25 mg Phenergan preprocedure due to history of anesthesia induced nausea, and daily antianxiety medication.  Proceed with TCS with Dr. Gala Romney in near future: the risks, benefits, and alternatives have been discussed with the patient in detail. The patient states understanding and desires to proceed.  Patient is not on any anticoagulants other than 81 mg daily aspirin, no diabetes medications, no chronic pain medications. Denies alcohol and drug use.

## 2014-09-15 NOTE — Patient Instructions (Signed)
1. We will schedule your procedure for you. 2. Further recommendations to be based on results of your procedure. 

## 2014-09-23 ENCOUNTER — Emergency Department (HOSPITAL_COMMUNITY)
Admission: EM | Admit: 2014-09-23 | Discharge: 2014-09-23 | Disposition: A | Discharge status: Home / Self Care | Payer: 59 | Attending: Emergency Medicine | Admitting: Emergency Medicine

## 2014-09-23 ENCOUNTER — Emergency Department (HOSPITAL_COMMUNITY): Payer: 59

## 2014-09-23 ENCOUNTER — Encounter (HOSPITAL_COMMUNITY): Payer: Self-pay | Admitting: Emergency Medicine

## 2014-09-23 DIAGNOSIS — Z79899 Other long term (current) drug therapy: Secondary | ICD-10-CM | POA: Insufficient documentation

## 2014-09-23 DIAGNOSIS — D509 Iron deficiency anemia, unspecified: Secondary | ICD-10-CM | POA: Insufficient documentation

## 2014-09-23 DIAGNOSIS — R112 Nausea with vomiting, unspecified: Secondary | ICD-10-CM | POA: Diagnosis not present

## 2014-09-23 DIAGNOSIS — Z880 Allergy status to penicillin: Secondary | ICD-10-CM | POA: Insufficient documentation

## 2014-09-23 DIAGNOSIS — K589 Irritable bowel syndrome without diarrhea: Secondary | ICD-10-CM | POA: Insufficient documentation

## 2014-09-23 DIAGNOSIS — R1031 Right lower quadrant pain: Secondary | ICD-10-CM | POA: Diagnosis not present

## 2014-09-23 DIAGNOSIS — Z8619 Personal history of other infectious and parasitic diseases: Secondary | ICD-10-CM | POA: Diagnosis not present

## 2014-09-23 DIAGNOSIS — I10 Essential (primary) hypertension: Secondary | ICD-10-CM | POA: Insufficient documentation

## 2014-09-23 DIAGNOSIS — F419 Anxiety disorder, unspecified: Secondary | ICD-10-CM | POA: Diagnosis not present

## 2014-09-23 DIAGNOSIS — K219 Gastro-esophageal reflux disease without esophagitis: Secondary | ICD-10-CM | POA: Diagnosis not present

## 2014-09-23 DIAGNOSIS — Z720 Tobacco use: Secondary | ICD-10-CM | POA: Diagnosis not present

## 2014-09-23 DIAGNOSIS — Z791 Long term (current) use of non-steroidal anti-inflammatories (NSAID): Secondary | ICD-10-CM | POA: Diagnosis not present

## 2014-09-23 DIAGNOSIS — Z7982 Long term (current) use of aspirin: Secondary | ICD-10-CM | POA: Insufficient documentation

## 2014-09-23 DIAGNOSIS — R1030 Lower abdominal pain, unspecified: Secondary | ICD-10-CM

## 2014-09-23 DIAGNOSIS — Z9049 Acquired absence of other specified parts of digestive tract: Secondary | ICD-10-CM | POA: Insufficient documentation

## 2014-09-23 DIAGNOSIS — Z9889 Other specified postprocedural states: Secondary | ICD-10-CM | POA: Insufficient documentation

## 2014-09-23 DIAGNOSIS — Z7951 Long term (current) use of inhaled steroids: Secondary | ICD-10-CM | POA: Diagnosis not present

## 2014-09-23 DIAGNOSIS — R1032 Left lower quadrant pain: Secondary | ICD-10-CM | POA: Insufficient documentation

## 2014-09-23 DIAGNOSIS — Z91040 Latex allergy status: Secondary | ICD-10-CM | POA: Diagnosis not present

## 2014-09-23 DIAGNOSIS — R197 Diarrhea, unspecified: Secondary | ICD-10-CM | POA: Diagnosis not present

## 2014-09-23 DIAGNOSIS — Z3202 Encounter for pregnancy test, result negative: Secondary | ICD-10-CM | POA: Diagnosis not present

## 2014-09-23 DIAGNOSIS — M1990 Unspecified osteoarthritis, unspecified site: Secondary | ICD-10-CM | POA: Diagnosis not present

## 2014-09-23 DIAGNOSIS — E559 Vitamin D deficiency, unspecified: Secondary | ICD-10-CM | POA: Diagnosis not present

## 2014-09-23 DIAGNOSIS — J45909 Unspecified asthma, uncomplicated: Secondary | ICD-10-CM | POA: Insufficient documentation

## 2014-09-23 DIAGNOSIS — F319 Bipolar disorder, unspecified: Secondary | ICD-10-CM | POA: Insufficient documentation

## 2014-09-23 DIAGNOSIS — Z9851 Tubal ligation status: Secondary | ICD-10-CM | POA: Insufficient documentation

## 2014-09-23 DIAGNOSIS — Z872 Personal history of diseases of the skin and subcutaneous tissue: Secondary | ICD-10-CM | POA: Diagnosis not present

## 2014-09-23 DIAGNOSIS — Z85048 Personal history of other malignant neoplasm of rectum, rectosigmoid junction, and anus: Secondary | ICD-10-CM | POA: Insufficient documentation

## 2014-09-23 LAB — COMPREHENSIVE METABOLIC PANEL
ALT: 12 U/L — ABNORMAL LOW (ref 14–54)
AST: 11 U/L — ABNORMAL LOW (ref 15–41)
Albumin: 3.9 g/dL (ref 3.5–5.0)
Alkaline Phosphatase: 65 U/L (ref 38–126)
Anion gap: 9 (ref 5–15)
BUN: 10 mg/dL (ref 6–20)
CO2: 25 mmol/L (ref 22–32)
Calcium: 8.8 mg/dL — ABNORMAL LOW (ref 8.9–10.3)
Chloride: 104 mmol/L (ref 101–111)
Creatinine, Ser: 0.63 mg/dL (ref 0.44–1.00)
GFR calc Af Amer: >60 mL/min (ref >60)
GFR calc non Af Amer: >60 mL/min (ref >60)
Glucose, Bld: 110 mg/dL — ABNORMAL HIGH (ref 65–99)
Potassium: 3.7 mmol/L (ref 3.5–5.1)
Sodium: 138 mmol/L (ref 135–145)
Total Bilirubin: 0.7 mg/dL (ref 0.3–1.2)
Total Protein: 7 g/dL (ref 6.5–8.1)

## 2014-09-23 LAB — URINALYSIS, ROUTINE W REFLEX MICROSCOPIC
Bilirubin Urine: NEGATIVE
Glucose, UA: NEGATIVE mg/dL
Ketones, ur: NEGATIVE mg/dL
Leukocytes, UA: NEGATIVE
Nitrite: NEGATIVE
Protein, ur: NEGATIVE mg/dL
Specific Gravity, Urine: 1.015 (ref 1.005–1.030)
Urobilinogen, UA: 0.2 mg/dL (ref 0.0–1.0)
pH: 6 (ref 5.0–8.0)

## 2014-09-23 LAB — CBC WITH DIFFERENTIAL/PLATELET
Basophils Absolute: 0 10*3/uL (ref 0.0–0.1)
Basophils Relative: 0 % (ref 0–1)
Eosinophils Absolute: 0.1 10*3/uL (ref 0.0–0.7)
Eosinophils Relative: 1 % (ref 0–5)
HCT: 44.9 % (ref 36.0–46.0)
Hemoglobin: 14.8 g/dL (ref 12.0–15.0)
Lymphocytes Relative: 26 % (ref 12–46)
Lymphs Abs: 2.3 10*3/uL (ref 0.7–4.0)
MCH: 28 pg (ref 26.0–34.0)
MCHC: 33 g/dL (ref 30.0–36.0)
MCV: 85 fL (ref 78.0–100.0)
Monocytes Absolute: 0.4 10*3/uL (ref 0.1–1.0)
Monocytes Relative: 5 % (ref 3–12)
Neutro Abs: 6.1 10*3/uL (ref 1.7–7.7)
Neutrophils Relative %: 68 % (ref 43–77)
Platelets: 270 10*3/uL (ref 150–400)
RBC: 5.28 MIL/uL — ABNORMAL HIGH (ref 3.87–5.11)
RDW: 15.5 % (ref 11.5–15.5)
WBC: 8.9 10*3/uL (ref 4.0–10.5)

## 2014-09-23 LAB — I-STAT CG4 LACTIC ACID, ED: Lactic Acid, Venous: 1.74 mmol/L (ref 0.5–2.0)

## 2014-09-23 LAB — URINE MICROSCOPIC-ADD ON

## 2014-09-23 LAB — POC URINE PREG, ED: Preg Test, Ur: NEGATIVE

## 2014-09-23 LAB — LIPASE, BLOOD: Lipase: 17 U/L — ABNORMAL LOW (ref 22–51)

## 2014-09-23 MED ORDER — OXYCODONE-ACETAMINOPHEN 5-325 MG PO TABS: 1.0000 | ORAL_TABLET | ORAL | Status: DC | PRN

## 2014-09-23 MED ORDER — MORPHINE SULFATE 4 MG/ML IJ SOLN
4.0000 mg | Freq: Once | INTRAMUSCULAR | Status: AC
  Administered 2014-09-23: 4 mg via INTRAVENOUS
  Filled 2014-09-23: qty 1

## 2014-09-23 MED ORDER — ONDANSETRON HCL 4 MG/2ML IJ SOLN
4.0000 mg | Freq: Once | INTRAMUSCULAR | Status: AC
  Administered 2014-09-23: 4 mg via INTRAVENOUS
  Filled 2014-09-23: qty 2

## 2014-09-23 MED ORDER — SODIUM CHLORIDE 0.9 % IV BOLUS (SEPSIS)
500.0000 mL | Freq: Once | INTRAVENOUS | Status: AC
  Administered 2014-09-23: 500 mL via INTRAVENOUS

## 2014-09-23 NOTE — ED Notes (Signed)
Pain to lower abdomen for last 7 days.  Rates pain 9/10.  Did not take any medication for pain today. Pt says she did OTC UTI strip and it showed positive for UTI about 5 days ago.  Trying to treat UTI at home.

## 2014-09-23 NOTE — Discharge Instructions (Signed)
Abdominal Pain, Women °Abdominal (stomach, pelvic, or belly) pain can be caused by many things. It is important to tell your doctor: °· The location of the pain. °· Does it come and go or is it present all the time? °· Are there things that start the pain (eating certain foods, exercise)? °· Are there other symptoms associated with the pain (fever, nausea, vomiting, diarrhea)? °All of this is helpful to know when trying to find the cause of the pain. °CAUSES  °· Stomach: virus or bacteria infection, or ulcer. °· Intestine: appendicitis (inflamed appendix), regional ileitis (Crohn's disease), ulcerative colitis (inflamed colon), irritable bowel syndrome, diverticulitis (inflamed diverticulum of the colon), or cancer of the stomach or intestine. °· Gallbladder disease or stones in the gallbladder. °· Kidney disease, kidney stones, or infection. °· Pancreas infection or cancer. °· Fibromyalgia (pain disorder). °· Diseases of the female organs: °¨ Uterus: fibroid (non-cancerous) tumors or infection. °¨ Fallopian tubes: infection or tubal pregnancy. °¨ Ovary: cysts or tumors. °¨ Pelvic adhesions (scar tissue). °¨ Endometriosis (uterus lining tissue growing in the pelvis and on the pelvic organs). °¨ Pelvic congestion syndrome (female organs filling up with blood just before the menstrual period). °¨ Pain with the menstrual period. °¨ Pain with ovulation (producing an egg). °¨ Pain with an IUD (intrauterine device, birth control) in the uterus. °¨ Cancer of the female organs. °· Functional pain (pain not caused by a disease, may improve without treatment). °· Psychological pain. °· Depression. °DIAGNOSIS  °Your doctor will decide the seriousness of your pain by doing an examination. °· Blood tests. °· X-rays. °· Ultrasound. °· CT scan (computed tomography, special type of X-ray). °· MRI (magnetic resonance imaging). °· Cultures, for infection. °· Barium enema (dye inserted in the large intestine, to better view it with  X-rays). °· Colonoscopy (looking in intestine with a lighted tube). °· Laparoscopy (minor surgery, looking in abdomen with a lighted tube). °· Major abdominal exploratory surgery (looking in abdomen with a large incision). °TREATMENT  °The treatment will depend on the cause of the pain.  °· Many cases can be observed and treated at home. °· Over-the-counter medicines recommended by your caregiver. °· Prescription medicine. °· Antibiotics, for infection. °· Birth control pills, for painful periods or for ovulation pain. °· Hormone treatment, for endometriosis. °· Nerve blocking injections. °· Physical therapy. °· Antidepressants. °· Counseling with a psychologist or psychiatrist. °· Minor or major surgery. °HOME CARE INSTRUCTIONS  °· Do not take laxatives, unless directed by your caregiver. °· Take over-the-counter pain medicine only if ordered by your caregiver. Do not take aspirin because it can cause an upset stomach or bleeding. °· Try a clear liquid diet (broth or water) as ordered by your caregiver. Slowly move to a bland diet, as tolerated, if the pain is related to the stomach or intestine. °· Have a thermometer and take your temperature several times a day, and record it. °· Bed rest and sleep, if it helps the pain. °· Avoid sexual intercourse, if it causes pain. °· Avoid stressful situations. °· Keep your follow-up appointments and tests, as your caregiver orders. °· If the pain does not go away with medicine or surgery, you may try: °¨ Acupuncture. °¨ Relaxation exercises (yoga, meditation). °¨ Group therapy. °¨ Counseling. °SEEK MEDICAL CARE IF:  °· You notice certain foods cause stomach pain. °· Your home care treatment is not helping your pain. °· You need stronger pain medicine. °· You want your IUD removed. °· You feel faint or   lightheaded. °· You develop nausea and vomiting. °· You develop a rash. °· You are having side effects or an allergy to your medicine. °SEEK IMMEDIATE MEDICAL CARE IF:  °· Your  pain does not go away or gets worse. °· You have a fever. °· Your pain is felt only in portions of the abdomen. The right side could possibly be appendicitis. The left lower portion of the abdomen could be colitis or diverticulitis. °· You are passing blood in your stools (bright red or black tarry stools, with or without vomiting). °· You have blood in your urine. °· You develop chills, with or without a fever. °· You pass out. °MAKE SURE YOU:  °· Understand these instructions. °· Will watch your condition. °· Will get help right away if you are not doing well or get worse. °Document Released: 01/07/2007 Document Revised: 07/27/2013 Document Reviewed: 01/27/2009 °[employer]® Patient Information ©2015 [employer]. This information is not intended to replace advice given to you by your health care provider. Make sure you discuss any questions you have with your health care provider. ° °

## 2014-09-23 NOTE — ED Notes (Signed)
Significant other with patient answering questions for patient, won't let patient answer for herself and also states that "shouldn't all this be in her chart".

## 2014-09-23 NOTE — ED Provider Notes (Signed)
CSN: [id number]     Arrival date & time 09/23/14  1740 History   First MD Initiated Contact with Patient 09/23/14 1809     Chief Complaint  Patient presents with  . Abdominal Pain     (Consider location/radiation/quality/duration/timing/severity/associated sxs/prior Treatment) HPI Comments: Patient presents to the emergency department for evaluation of abdominal pain. Patient reports that she has been experiencing symptoms for 1 week. Patient reports that she initially had sharp pains and left lower abdomen, now it is across the entire lower abdomen. She reports sharp, stabbing pains. Pain is severe. She has had associated nausea and vomiting, no change in her bowel habits. She reports that she has constant and chronic diarrhea from irritable bowel. She has not had any rectal bleeding. She has not had any fever. She denies urinary symptoms.   Past Medical History  Diagnosis Date  . Polycystic ovarian syndrome   . Colitis due to Clostridium difficile 2001  . Hepatic hemangioma   . Anxiety   . IBS (irritable bowel syndrome)   . History of Salmonella gastroenteritis   . GERD (gastroesophageal reflux disease)   . Rectal cancer     a. Followed by Dr. Gala Romney, dx 2000-2001. Tumor removed from rectal.  . Bipolar 1 disorder   . Migraine headache   . Asthma   . Arthritis   . Hypertension     a. Improved without intervention.  . Allergy   . Tobacco dependence   . Hyperlipidemia     a. Noted 02/2014.  . Morbid obesity   . Microscopic hematuria     Intermittent (no w/u done yet, as of 03/2014)  . Pruritic dermatitis 2015/2016    +Scabies prep at [facility] 07/2014. (Primarily pruritic skin, but eventually a subtle rash as well)  . Iron deficiency anemia 05/2014    per pt it is not due to vaginal blood loss; hemoccults sent to pt in mail 413/16.  . Vitamin D deficiency    Past Surgical History  Procedure Laterality Date  . Cholecystectomy    . Colonoscopy  05/05/2007    adenoma   . Situ removed  age 47    High-grade rectal adenoma removed from rectum   . Dilation and curettage of uterus      for vaginal bleeding  . Tubal ligation    . Cardiovascular stress test  03/24/14    Lexscan MIBI: mild anterior ischemia?  Cardiac CT angiogram recommended/done.  . Transthoracic echocardiogram  03/23/14    Normal  . Left heart catheterization with coronary angiogram N/A 03/31/2014    Normal coronaries, EF 10-17%, diastolic dysfunction.  Procedure: LEFT HEART CATHETERIZATION WITH CORONARY ANGIOGRAM;  Surgeon: Sinclair Grooms, MD;  Location: [facility] CATH LAB;  Service: Cardiovascular;  Laterality: N/A;  . Coronary ct angio  03/29/14    Two vessel dz/moderate stenosis of mid LAD and proximal RCA; cath recommended.   Family History  Problem Relation Age of Onset  . Colon cancer Father 33  . Cancer Father   . Diverticulitis Mother   . Heart disease Mother   . Hypertension Mother   . Hyperlipidemia Mother   . Mental illness Mother   . Diabetes Mother   . Heart attack Mother   . Stroke Neg Hx    History  Substance Use Topics  . Smoking status: Current Every Day Smoker -- 0.50 packs/day for 18 years    Types: Cigarettes  . Smokeless tobacco: Never Used     Comment: 25  years  . Alcohol Use: No   OB History    Gravida Para Term Preterm AB TAB SAB Ectopic Multiple Living   4 1 1  3  3   1      Review of Systems  Gastrointestinal: Positive for nausea, vomiting and abdominal pain.  All other systems reviewed and are negative.     Allergies  Contrast media; Shellfish allergy; Amoxil; Codeine; Latex; Lipitor; and Permethrin  Home Medications   Prior to Admission medications   Medication Sig Start Date End Date Taking? Authorizing Provider  albuterol (VENTOLIN HFA) 108 (90 BASE) MCG/ACT inhaler Inhale 2 puffs into the lungs every 6 (six) hours as needed for wheezing or shortness of breath. 12/02/13   Tammi Sou, MD  aspirin 81 MG chewable tablet Chew 1 tablet (81 mg  total) by mouth daily. 09/06/12   Ripley Fraise, MD  busPIRone (BUSPAR) 15 MG tablet Take 15 mg by mouth 3 (three) times daily.    Historical Provider, MD  Cetirizine HCl (ZYRTEC ALLERGY) 10 MG CAPS Take 1 capsule (10 mg total) by mouth daily. 08/24/14   Kristen N Ward, DO  clonazePAM (KLONOPIN) 0.5 MG tablet TAKE ONE TO TWO TABLETS BY MOUTH TWICE DAILY AS NEEDED FOR ANXIETY 09/10/14   Irene Pap, NP  diphenhydrAMINE (BENADRYL) 25 mg capsule Take 50 mg by mouth at bedtime.     Historical Provider, MD  diphenoxylate-atropine (LOMOTIL) 2.5-0.025 MG per tablet Take 1 tablet by mouth 4 (four) times daily as needed for diarrhea or loose stools.    Historical Provider, MD  ergocalciferol (VITAMIN D2) 50000 UNITS capsule Take 1 capsule (50,000 Units total) by mouth once a week. 07/01/14   Tammi Sou, MD  ferrous sulfate 325 (65 FE) MG tablet Take 325 mg by mouth 2 (two) times daily with a meal.    Historical Provider, MD  fluticasone (FLONASE) 50 MCG/ACT nasal spray Place 2 sprays into both nostrils daily. 08/24/14   Kristen N Ward, DO  hydrocortisone cream 1 % Apply topically 3 (three) times daily. Patient taking differently: Apply 1 application topically daily as needed for itching.  03/25/14   Eugenie Filler, MD  hydrOXYzine (ATARAX/VISTARIL) 25 MG tablet Take 25 mg by mouth 3 (three) times daily.     Historical Provider, MD  hyoscyamine (LEVSIN SL) 0.125 MG SL tablet Place 1 tablet (0.125 mg total) under the tongue every 4 (four) hours as needed. 07/26/14   Carlis Stable, NP  ibuprofen (ADVIL,MOTRIN) 800 MG tablet Take 1 tablet (800 mg total) by mouth 3 (three) times daily. 06/28/14   Lily Kocher, PA-C  lamoTRIgine (LAMICTAL) 100 MG tablet Take 150 mg by mouth at bedtime.     Historical Provider, MD  nitroGLYCERIN (NITROSTAT) 0.4 MG SL tablet Place 1 tablet (0.4 mg total) under the tongue every 5 (five) minutes as needed for chest pain. Patient not taking: Reported on 09/15/2014 03/25/14   Eugenie Filler, MD  ondansetron (ZOFRAN) 4 MG tablet Take 1 tablet (4 mg total) by mouth every 8 (eight) hours as needed for nausea or vomiting. 07/16/14   Rolland Porter, MD  pantoprazole (PROTONIX) 40 MG tablet TAKE ONE TABLET BY MOUTH ONCE DAILY 09/02/14   Tammi Sou, MD  promethazine (PHENERGAN) 25 MG tablet Take 1 tablet (25 mg total) by mouth every 6 (six) hours as needed for nausea or vomiting. 08/24/14   Kristen N Ward, DO  Sod Picosulfate-Mag Ox-Cit Acd 10-3.5-12 MG-GM-GM PACK Take 1  Container by mouth as directed. 09/15/14   Daneil Dolin, MD   BP 129/89 mmHg  Pulse 100  Temp(Src) 98.3 F (36.8 C) (Oral)  Resp 18  Ht 5' (1.524 m)  Wt 232 lb (105.235 kg)  BMI 45.31 kg/m2  SpO2 99%  LMP 08/31/2014 Physical Exam  Constitutional: She is oriented to person, place, and time. She appears well-developed and well-nourished. No distress.  HENT:  Head: Normocephalic and atraumatic.  Right Ear: Hearing normal.  Left Ear: Hearing normal.  Nose: Nose normal.  Mouth/Throat: Oropharynx is clear and moist and mucous membranes are normal.  Eyes: Conjunctivae and EOM are normal. Pupils are equal, round, and reactive to light.  Neck: Normal range of motion. Neck supple.  Cardiovascular: Regular rhythm, S1 normal and S2 normal.  Exam reveals no gallop and no friction rub.   No murmur heard. Pulmonary/Chest: Effort normal and breath sounds normal. No respiratory distress. She exhibits no tenderness.  Abdominal: Soft. Normal appearance and bowel sounds are normal. There is no hepatosplenomegaly. There is tenderness in the right lower quadrant, suprapubic area and left lower quadrant. There is no rebound, no guarding, no tenderness at McBurney's point and negative Murphy's sign. No hernia.  Musculoskeletal: Normal range of motion.  Neurological: She is alert and oriented to person, place, and time. She has normal strength. No cranial nerve deficit or sensory deficit. Coordination normal. GCS eye subscore  is 4. GCS verbal subscore is 5. GCS motor subscore is 6.  Skin: Skin is warm, dry and intact. No rash noted. No cyanosis.  Psychiatric: She has a normal mood and affect. Her speech is normal and behavior is normal. Thought content normal.  Nursing note and vitals reviewed.   ED Course  Procedures (including critical care time) Labs Review Labs Reviewed  CBC WITH DIFFERENTIAL/PLATELET - Abnormal; Notable for the following:    RBC 5.28 (*)    All other components within normal limits  COMPREHENSIVE METABOLIC PANEL - Abnormal; Notable for the following:    Glucose, Bld 110 (*)    Calcium 8.8 (*)    AST 11 (*)    ALT 12 (*)    All other components within normal limits  LIPASE, BLOOD - Abnormal; Notable for the following:    Lipase 17 (*)    All other components within normal limits  URINALYSIS, ROUTINE W REFLEX MICROSCOPIC (NOT AT [facility]) - Abnormal; Notable for the following:    Hgb urine dipstick SMALL (*)    All other components within normal limits  URINE MICROSCOPIC-ADD ON  I-STAT CG4 LACTIC ACID, ED  POC URINE PREG, ED    Imaging Review Ct Abdomen Pelvis Wo Contrast  09/23/2014   CLINICAL DATA:  Lower abdominal pain for last 7 days, severe.  EXAM: CT ABDOMEN AND PELVIS WITHOUT CONTRAST  TECHNIQUE: Multidetector CT imaging of the abdomen and pelvis was performed following the standard protocol without IV contrast.  COMPARISON:  None.  FINDINGS: Lung bases:  Clear.  Heart normal size.  Liver: Mild hepatomegaly. Diffuse fatty infiltration. No mass or focal lesion.  Gallbladder and biliary tree: Status post cholecystectomy. No bile duct dilation.  Spleen, pancreas, adrenal glands:  Normal.  Kidneys, ureters, bladder: 3.3 cm water density mass protrudes from the posterior inferior aspect of the left kidney consistent with a cyst. No other renal masses, no stones and no hydronephrosis. Normal ureters. Normal bladder.  Uterus and adnexa:  Unremarkable.  Lymph nodes:  No enlarged or abnormal  lymph nodes.  Ascites:  None.  Gastrointestinal: There are few scattered left colon diverticula. No diverticulitis, colonic wall thickening or mesenteric inflammation. Normal small bowel. Normal appendix visualized.  Miscellaneous: Incidental note is made of a lipoma along the anterior margin of the left iliopsoas muscle.  Musculoskeletal: Mild degenerative changes of the lower thoracic and lumbar spine. No osteoblastic or osteolytic lesions.  IMPRESSION: 1. No acute findings. No findings to explain this patient's abdominal pain. 2. There are few colonic diverticula but no evidence of diverticulitis. Normal appendix visualized. 3. Mild hepatomegaly and diffuse hepatic steatosis. 4. Status post cholecystectomy. 5. 3.3 cm lower pole left renal cyst.   Electronically Signed   By: Lajean Manes M.D.   On: 09/23/2014 19:09     EKG Interpretation None      MDM   Final diagnoses:  None   abdominal pain, no pain patient presents with one week of progressively worsening lower abdominal pain. Pain began on the left side, but now is on both sides of her abdomen. She thinks she might have a UTI. She also reports a history of polycystic ovarian syndrome, thinks she might be expensive another ovarian cyst.  As always revealed diffuse tenderness, no guarding or rebound. Patient treated with IV fluids and analgesia. Lab work was unremarkable. This included a normal urinalysis. CT scan was performed and does not show any acute abnormality.      Orpah Greek, MD 09/23/14 [phone]

## 2014-09-23 NOTE — ED Notes (Signed)
Pt. Wants to get IV fluids prior to attempting urine sample.

## 2014-09-25 ENCOUNTER — Emergency Department (HOSPITAL_COMMUNITY)
Admission: EM | Admit: 2014-09-25 | Discharge: 2014-09-25 | Disposition: A | Discharge status: Home / Self Care | Payer: 59 | Attending: Emergency Medicine | Admitting: Emergency Medicine

## 2014-09-25 ENCOUNTER — Encounter (HOSPITAL_COMMUNITY): Payer: Self-pay | Admitting: Emergency Medicine

## 2014-09-25 DIAGNOSIS — M1990 Unspecified osteoarthritis, unspecified site: Secondary | ICD-10-CM | POA: Diagnosis not present

## 2014-09-25 DIAGNOSIS — I10 Essential (primary) hypertension: Secondary | ICD-10-CM | POA: Insufficient documentation

## 2014-09-25 DIAGNOSIS — G43909 Migraine, unspecified, not intractable, without status migrainosus: Secondary | ICD-10-CM | POA: Diagnosis not present

## 2014-09-25 DIAGNOSIS — Z872 Personal history of diseases of the skin and subcutaneous tissue: Secondary | ICD-10-CM | POA: Diagnosis not present

## 2014-09-25 DIAGNOSIS — F319 Bipolar disorder, unspecified: Secondary | ICD-10-CM | POA: Insufficient documentation

## 2014-09-25 DIAGNOSIS — Z79899 Other long term (current) drug therapy: Secondary | ICD-10-CM | POA: Diagnosis not present

## 2014-09-25 DIAGNOSIS — R45851 Suicidal ideations: Secondary | ICD-10-CM | POA: Diagnosis present

## 2014-09-25 DIAGNOSIS — Z8505 Personal history of malignant neoplasm of liver: Secondary | ICD-10-CM | POA: Insufficient documentation

## 2014-09-25 DIAGNOSIS — Z85048 Personal history of other malignant neoplasm of rectum, rectosigmoid junction, and anus: Secondary | ICD-10-CM | POA: Insufficient documentation

## 2014-09-25 DIAGNOSIS — Z91040 Latex allergy status: Secondary | ICD-10-CM | POA: Diagnosis not present

## 2014-09-25 DIAGNOSIS — J45909 Unspecified asthma, uncomplicated: Secondary | ICD-10-CM | POA: Insufficient documentation

## 2014-09-25 DIAGNOSIS — Z7951 Long term (current) use of inhaled steroids: Secondary | ICD-10-CM | POA: Diagnosis not present

## 2014-09-25 DIAGNOSIS — Z720 Tobacco use: Secondary | ICD-10-CM | POA: Diagnosis not present

## 2014-09-25 DIAGNOSIS — F419 Anxiety disorder, unspecified: Secondary | ICD-10-CM | POA: Insufficient documentation

## 2014-09-25 DIAGNOSIS — Z3202 Encounter for pregnancy test, result negative: Secondary | ICD-10-CM | POA: Diagnosis not present

## 2014-09-25 DIAGNOSIS — Z7982 Long term (current) use of aspirin: Secondary | ICD-10-CM | POA: Insufficient documentation

## 2014-09-25 DIAGNOSIS — Z8619 Personal history of other infectious and parasitic diseases: Secondary | ICD-10-CM | POA: Insufficient documentation

## 2014-09-25 DIAGNOSIS — K219 Gastro-esophageal reflux disease without esophagitis: Secondary | ICD-10-CM | POA: Insufficient documentation

## 2014-09-25 DIAGNOSIS — D509 Iron deficiency anemia, unspecified: Secondary | ICD-10-CM | POA: Insufficient documentation

## 2014-09-25 DIAGNOSIS — Z139 Encounter for screening, unspecified: Secondary | ICD-10-CM

## 2014-09-25 LAB — CBC WITH DIFFERENTIAL/PLATELET
Basophils Absolute: 0 10*3/uL (ref 0.0–0.1)
Basophils Relative: 0 % (ref 0–1)
Eosinophils Absolute: 0.1 10*3/uL (ref 0.0–0.7)
Eosinophils Relative: 1 % (ref 0–5)
HCT: 42.8 % (ref 36.0–46.0)
Hemoglobin: 14.2 g/dL (ref 12.0–15.0)
Lymphocytes Relative: 24 % (ref 12–46)
Lymphs Abs: 2.5 10*3/uL (ref 0.7–4.0)
MCH: 28.3 pg (ref 26.0–34.0)
MCHC: 33.2 g/dL (ref 30.0–36.0)
MCV: 85.4 fL (ref 78.0–100.0)
Monocytes Absolute: 0.4 10*3/uL (ref 0.1–1.0)
Monocytes Relative: 4 % (ref 3–12)
Neutro Abs: 7.3 10*3/uL (ref 1.7–7.7)
Neutrophils Relative %: 71 % (ref 43–77)
Platelets: 273 10*3/uL (ref 150–400)
RBC: 5.01 MIL/uL (ref 3.87–5.11)
RDW: 15.6 % — ABNORMAL HIGH (ref 11.5–15.5)
WBC: 10.2 10*3/uL (ref 4.0–10.5)

## 2014-09-25 LAB — RAPID URINE DRUG SCREEN, HOSP PERFORMED
Amphetamines: NOT DETECTED
Barbiturates: NOT DETECTED
Benzodiazepines: NOT DETECTED
Cocaine: NOT DETECTED
Opiates: NOT DETECTED
Tetrahydrocannabinol: NOT DETECTED

## 2014-09-25 LAB — BASIC METABOLIC PANEL
Anion gap: 9 (ref 5–15)
BUN: 9 mg/dL (ref 6–20)
CO2: 25 mmol/L (ref 22–32)
Calcium: 9 mg/dL (ref 8.9–10.3)
Chloride: 106 mmol/L (ref 101–111)
Creatinine, Ser: 0.63 mg/dL (ref 0.44–1.00)
GFR calc Af Amer: >60 mL/min (ref >60)
GFR calc non Af Amer: >60 mL/min (ref >60)
Glucose, Bld: 90 mg/dL (ref 65–99)
Potassium: 3.9 mmol/L (ref 3.5–5.1)
Sodium: 140 mmol/L (ref 135–145)

## 2014-09-25 LAB — PREGNANCY, URINE: Preg Test, Ur: NEGATIVE

## 2014-09-25 LAB — ETHANOL: Alcohol, Ethyl (B): <5 mg/dL (ref <5)

## 2014-09-25 NOTE — BH Assessment (Signed)
Received notification of TTS consult request. Spoke to Dr. Roderic Palau who said to proceed with tele-assessment.   Orpah Greek Anson Fret, [place], [facility], [facility] Triage Specialist [phone]

## 2014-09-25 NOTE — ED Notes (Signed)
Pt brought in by sheriff dept with IVC papers-  Papers states pt suidical- however pt claims papers put out on her by ex boyfriend because she broke up with him. Pt calm and cooperative in triage Mother present

## 2014-09-25 NOTE — Discharge Instructions (Signed)
°Emergency Department Resource Guide °1) Find a Doctor and Pay Out of Pocket °Although you won't have to find out who is covered by your insurance plan, it is a good idea to ask around and get recommendations. You will then need to call the office and see if the doctor you have chosen will accept you as a new patient and what types of options they offer for patients who are self-pay. Some doctors offer discounts or will set up payment plans for their patients who do not have insurance, but you will need to ask so you aren't surprised when you get to your appointment. ° °2) Contact Your Local Health Department °Not all health departments have doctors that can see patients for sick visits, but many do, so it is worth a call to see if yours does. If you don't know where your local health department is, you can check in your phone book. The CDC also has a tool to help you locate your state's health department, and many state websites also have listings of all of their local health departments. ° °3) Find a Walk-in Clinic °If your illness is not likely to be very severe or complicated, you may want to try a walk in clinic. These are popping up all over the country in pharmacies, drugstores, and shopping centers. They're usually staffed by nurse practitioners or physician assistants that have been trained to treat common illnesses and complaints. They're usually fairly quick and inexpensive. However, if you have serious medical issues or chronic medical problems, these are probably not your best option. ° °No Primary Care Doctor: °- Call [facility] at  832-8000 - they can help you locate a primary care doctor that  accepts your insurance, provides certain services, etc. °- Physician Referral Service- 1-800-533-3463 ° °Chronic Pain Problems: °Organization         Address  Phone   Notes  °[facility]  (336) [phone] Patients need to be referred by their primary care doctor.  ° °Medication  Assistance: °Organization         Address  Phone   Notes  °[place] [facility] [address] °[place], [place] [address] (336) 641-8030 --Must be a resident of [place] °-- Must have NO insurance coverage whatsoever (no Medicaid/ Medicare, etc.) °-- The pt. MUST have a primary care doctor that directs their care regularly and follows them in the community °  °[facility]  (866) 331-1348   °[employer]  (888) 892-1162   ° °Agencies that provide inexpensive medical care: °Organization         Address  Phone   Notes  °[facility]  (336) 832-8035   °[facility]    (336) 832-7272   °[facility] [address] °[place], [place] [address] (336) 832-4777   °[facility] [address], °[place] (336) 271-4999   °[facility]    (336) 373-0678   °[facility]    (336) 272-1050   °[facility] ° [address], [place] Phone:  (336) 832-4444, Fax:  (336) 832-4440 Hours of Operation:  9 am - 6 pm, M-F.  Also accepts Medicaid/Medicare and self-pay.  °[facility] ° [address], [place] Phone: (336) [phone], Fax: (336) [phone]. Hours of Operation:  8:30 am - 5:30 pm, M-F.  Also accepts Medicaid and self-pay.  °[facility] [place] [address], [place] Phone: (336) 878-6027   °[facility] [address], [place], [place] (336)723-1848, Ext. 123 Mondays & Thursdays: 7-9 AM.  First 15 patients are seen on a first come, first serve basis. °  ° °Medicaid-accepting [place] Providers: ° °Organization         Address  Phone   Notes  °[facility] [address], [place] (336) 641-2100 Also accepts self-pay patients.  °[facility] [address], [place] ° (336) 856-9996   °[facility] [address], [place]  (336) 288-8857   °[facility] [address] [place] [address], [place] (336) 299-7000   °Veita Bland [address], [place]  ° (336) 373-1557 Only accepts [place] Access Medicaid patients after they have their name applied to their card.  ° °Self-Pay (no insurance) in [place]: ° °Organization         Address  Phone   Notes  °Sickle Cell Patients, [facility] [address], [place] (336) 832-1970   °[facility] [address], [place] (336) 832-4400   °[facility] ° [address], [place] (336) 992-4800   °[facility]/Dr. Osei-Bonsu ° [address] [place] [address], [place] or [address], [place] (336) 841-8500 Phone number for both [place] and [place] locations is the same.  °[facility] [address], [place] (336) 299-0000   °[facility] [address] [place] [address], [place] or [address] (336) 852-7530 °(336) 878-2260   °[facility] [address], [place] (336) 350-1642, phone; (336) 294-5005, fax Sees patients 1st and 3rd Saturday of every month.  Must not qualify for public or private insurance (i.e. Medicaid, Medicare, [place] Health Choice, Veterans' Benefits) • Household income should be no more than 200% of the poverty level •The clinic cannot treat you if you are pregnant or think you are pregnant • Sexually transmitted diseases are not treated at the clinic.  ° ° °Dental Care: °Organization         Address  Phone  Notes  °[place] [facility] [address], [place] (336) 641-6152 Accepts children up to age 21 who are enrolled in Medicaid or [place] Health Choice; pregnant women with a Medicaid card; and children who have applied for Medicaid or [place] Health Choice, but were declined, whose parents can pay a reduced fee at time of service.  °[place]  [facility] [place]  [address], [place] (336) 641-7733 Accepts children up to age 21 who are enrolled in Medicaid or [place] Health Choice; pregnant women with a Medicaid card; and children who have applied for Medicaid or [place] Health Choice, but were declined, whose parents can pay a reduced fee at time of service.  °[facility] ° [address], [place] (336) 641-4533 Patients are seen by appointment only. Walk-ins are not accepted. [facility] will see patients 18 years of age and older. °Monday - Tuesday (8am-5pm) °Most Wednesdays (8:30-5pm) °$30 per visit, cash only  °[facility] ° [address], [place] (336) 641-4533 Patients are seen by appointment only. Walk-ins are not accepted. [facility] will see patients 18 years of age and older. °One   Wednesday Evening (Monthly: Volunteer Based).  $30 per visit, cash only  °[facility]  (919) 537-3737 for adults; Children under age 4, call Graduate Pediatric Dentistry at ([phone]) [phone]. Children aged 4-14, please call ([phone]) [phone] to request a pediatric application. ° Dental services are provided in all areas of dental care including fillings, crowns and bridges, complete and partial dentures, implants, gum treatment, root canals, and extractions. Preventive care is also provided. Treatment is provided to both adults and children. °Patients are selected via a lottery and there is often a waiting list. °  °[facility] [address], °[place] ° (336) 763-8833 [email or web address] °  °[facility] [address], [place], [place] (336)723-1848, Ext. 123 Second and Fourth Thursday of each month, opens at 6:30 AM; Clinic ends at 9 AM.  Patients are seen on a first-come first-served basis, and a limited number are seen during each clinic.  ° °[facility] ° [address], [place], [place] (336) 723-7904    Eligibility Requirements °You must have lived in [place], [place], or [place] counties for at least the last three months. °  You cannot be eligible for state or federal sponsored healthcare insurance, including Veterans Administration, Medicaid, or Medicare. °  You generally cannot be eligible for healthcare insurance through your employer.  °  How to apply: °Eligibility screenings are held every Tuesday and Wednesday afternoon from 1:00 pm until 4:00 pm. You do not need an appointment for the interview!  °[facility] [address], [place], [place] 336-631-2330   °[facility]  336-342-8273   °[facility]  336-703-3100   °[facility]  336-570-6415   ° °Behavioral Health Resources in the Community: °Intensive Outpatient Programs °Organization         Address  Phone  Notes  °[facility] [address], [place], [place] 336-878-6098   °[facility] [address], [place], [place] 336-832-9800   °[facility] [address], [place], [place] ° 336-882-2125   °[facility] [address],  °[place], [place] 1-800-853-5163 or 336-641-4981   °Substance Abuse Resources °Organization         Address  Phone  Notes  °[facility]  336-882-2125   °[facility]  336-784-9470   °[facility]  336-285-9073   °[facility]  336-845-3988   °Residential & Outpatient Substance Abuse Program  1-800-659-3381   °Psychological Services °Organization         Address  Phone  Notes  ° Health  336- 832-9600   °[facility]  336- 378-7881   °[facility] [address], [place] 1-800-853-5163 or 336-641-4981   ° °Mobile Crisis Teams °Organization         Address  Phone  Notes  °[facility], Mobile Crisis Care Unit  1-877-626-1772   °Assertive °[facility] ° [address]  [place], [place] 336-834-9664   °Sharon DeEsch [address] °[place] 336-554-5454   ° °Self-Help/Support Groups °Organization         Address  Phone             Notes  °[facility] [place] - variety of support groups  336- 373-1402 Call for more information  °Narcotics Anonymous (NA), [facility] [address], °[place]  2 meetings at this location  ° °  Residential Treatment Programs Organization         Address  Phone  Notes  [facility] [address]    [place]  1-(808)178-6944   [facility]  [address] [place] [address], [place], [place]   [facility] [place], [place] [phone] Admissions: 8am-3pm M-F  [facility] [address]    [place], [place] 784-696-2952   [facility] [address] [place], [place], [place]   [facility] [address],  [place], [place]   [facility] - Intensive Outpatient [address], [place], [place]   [facility] ([place]  [place], [place] 1-272-282-0445 or 505-697-6011   [facility] [address] [place], [place] Accepts Medicaid  [facility] [address],  [place] 1-289 850 3224 Substance Abuse/Addiction Treatment   [facility]         Address  Phone  Notes  [facility]  [phone]   Domenic Schwab, PhD [address] Arlis Porta [place], [place]   (434)878-9480 or (660) 187-7627   [facility] [place], [place] 915-001-0774   [facility] [address] [place], [place] 786-116-6900 Insurance/Medicaid/sponsorship through [facility] [address] [place]                                    [place], [place] [phone] [facility] [address], [place] 208-072-1887    Dr. Adele Schilder  [phone]   [facility] Dept. 1) [address] [place] 2) [place] 3)  [place], [place] [phone]) [phone]  (910) [phone]   [place] [phone] or [phone] (After Hours)      Take your usual prescriptions as previously directed.  Call your regular mental health provider at [facility] on Monday to schedule a follow up appointment this week.  Return to the Emergency Department immediately sooner if worsening.
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
## 2014-09-25 NOTE — ED Provider Notes (Signed)
CSN: [id number]     Arrival date & time 09/25/14  1948 History   First MD Initiated Contact with Patient 09/25/14 2031     Chief Complaint  Patient presents with  . V70.1      HPI Pt was seen at 2110. Pt BIB Police and her mother under IVC for "SI."  IVC stats she told her boyfriend she would "overdose on meds." Pt states IVC papers were taken out on her by her ex-boyfriend because she broke up with him. Pt currently calm/cooperative. Denies SI, no HI, no hallucinations.    Past Medical History  Diagnosis Date  . Polycystic ovarian syndrome   . Colitis due to Clostridium difficile 2001  . Hepatic hemangioma   . Anxiety   . IBS (irritable bowel syndrome)   . History of Salmonella gastroenteritis   . GERD (gastroesophageal reflux disease)   . Rectal cancer     a. Followed by Dr. Gala Romney, dx 2000-2001. Tumor removed from rectal.  . Bipolar 1 disorder   . Migraine headache   . Asthma   . Arthritis   . Hypertension     a. Improved without intervention.  . Allergy   . Tobacco dependence   . Hyperlipidemia     a. Noted 02/2014.  . Morbid obesity   . Microscopic hematuria     Intermittent (no w/u done yet, as of 03/2014)  . Pruritic dermatitis 2015/2016    +Scabies prep at [facility] 07/2014. (Primarily pruritic skin, but eventually a subtle rash as well)  . Iron deficiency anemia 05/2014    per pt it is not due to vaginal blood loss; hemoccults sent to pt in mail 413/16.  . Vitamin D deficiency    Past Surgical History  Procedure Laterality Date  . Cholecystectomy    . Colonoscopy  05/05/2007    adenoma  . Situ removed  age 72    High-grade rectal adenoma removed from rectum   . Dilation and curettage of uterus      for vaginal bleeding  . Tubal ligation    . Cardiovascular stress test  03/24/14    Lexscan MIBI: mild anterior ischemia?  Cardiac CT angiogram recommended/done.  . Transthoracic echocardiogram  03/23/14    Normal  . Left heart catheterization with  coronary angiogram N/A 03/31/2014    Normal coronaries, EF 21-19%, diastolic dysfunction.  Procedure: LEFT HEART CATHETERIZATION WITH CORONARY ANGIOGRAM;  Surgeon: Sinclair Grooms, MD;  Location: [facility] CATH LAB;  Service: Cardiovascular;  Laterality: N/A;  . Coronary ct angio  03/29/14    Two vessel dz/moderate stenosis of mid LAD and proximal RCA; cath recommended.   Family History  Problem Relation Age of Onset  . Colon cancer Father 15  . Cancer Father   . Diverticulitis Mother   . Heart disease Mother   . Hypertension Mother   . Hyperlipidemia Mother   . Mental illness Mother   . Diabetes Mother   . Heart attack Mother   . Stroke Neg Hx    History  Substance Use Topics  . Smoking status: Current Every Day Smoker -- 0.50 packs/day for 18 years    Types: Cigarettes  . Smokeless tobacco: Never Used     Comment: 25 years  . Alcohol Use: No   OB History    Gravida Para Term Preterm AB TAB SAB Ectopic Multiple Living   4 1 1  3  3   1      Review of Systems  ROS: Statement: All systems negative except as marked or noted in the HPI; Constitutional: Negative for fever and chills. ; ; Eyes: Negative for eye pain, redness and discharge. ; ; ENMT: Negative for ear pain, hoarseness, nasal congestion, sinus pressure and sore throat. ; ; Cardiovascular: Negative for chest pain, palpitations, diaphoresis, dyspnea and peripheral edema. ; ; Respiratory: Negative for cough, wheezing and stridor. ; ; Gastrointestinal: Negative for nausea, vomiting, diarrhea, abdominal pain, blood in stool, hematemesis, jaundice and rectal bleeding. . ; ; Genitourinary: Negative for dysuria, flank pain and hematuria. ; ; Musculoskeletal: Negative for back pain and neck pain. Negative for swelling and trauma.; ; Skin: Negative for pruritus, rash, abrasions, blisters, bruising and skin lesion.; ; Neuro: Negative for headache, lightheadedness and neck stiffness. Negative for weakness, altered level of consciousness , altered  mental status, extremity weakness, paresthesias, involuntary movement, seizure and syncope.; Psych:  Denies SI. No HI, no hallucinations.      Allergies  Contrast media; Shellfish allergy; Amoxil; Codeine; Latex; Lipitor; and Permethrin  Home Medications   Prior to Admission medications   Medication Sig Start Date End Date Taking? Authorizing Provider  albuterol (VENTOLIN HFA) 108 (90 BASE) MCG/ACT inhaler Inhale 2 puffs into the lungs every 6 (six) hours as needed for wheezing or shortness of breath. 12/02/13   Tammi Sou, MD  aspirin 81 MG chewable tablet Chew 1 tablet (81 mg total) by mouth daily. 09/06/12   Ripley Fraise, MD  busPIRone (BUSPAR) 15 MG tablet Take 15 mg by mouth 3 (three) times daily.    Historical Provider, MD  Cetirizine HCl (ZYRTEC ALLERGY) 10 MG CAPS Take 1 capsule (10 mg total) by mouth daily. 08/24/14   Kristen N Ward, DO  clonazePAM (KLONOPIN) 0.5 MG tablet TAKE ONE TO TWO TABLETS BY MOUTH TWICE DAILY AS NEEDED FOR ANXIETY 09/10/14   Irene Pap, NP  diphenhydrAMINE (BENADRYL) 25 mg capsule Take 50 mg by mouth at bedtime.     Historical Provider, MD  diphenoxylate-atropine (LOMOTIL) 2.5-0.025 MG per tablet Take 1 tablet by mouth 4 (four) times daily as needed for diarrhea or loose stools.    Historical Provider, MD  ergocalciferol (VITAMIN D2) 50000 UNITS capsule Take 1 capsule (50,000 Units total) by mouth once a week. 07/01/14   Tammi Sou, MD  ferrous sulfate 325 (65 FE) MG tablet Take 325 mg by mouth 2 (two) times daily with a meal.    Historical Provider, MD  fluticasone (FLONASE) 50 MCG/ACT nasal spray Place 2 sprays into both nostrils daily. 08/24/14   Kristen N Ward, DO  hydrocortisone cream 1 % Apply topically 3 (three) times daily. Patient taking differently: Apply 1 application topically daily as needed for itching.  03/25/14   Eugenie Filler, MD  hydrOXYzine (ATARAX/VISTARIL) 25 MG tablet Take 25 mg by mouth 3 (three) times daily.      Historical Provider, MD  hyoscyamine (LEVSIN SL) 0.125 MG SL tablet Place 1 tablet (0.125 mg total) under the tongue every 4 (four) hours as needed. 07/26/14   Carlis Stable, NP  ibuprofen (ADVIL,MOTRIN) 800 MG tablet Take 1 tablet (800 mg total) by mouth 3 (three) times daily. 06/28/14   Lily Kocher, PA-C  lamoTRIgine (LAMICTAL) 100 MG tablet Take 150 mg by mouth at bedtime.     Historical Provider, MD  nitroGLYCERIN (NITROSTAT) 0.4 MG SL tablet Place 1 tablet (0.4 mg total) under the tongue every 5 (five) minutes as needed for chest pain. Patient not taking: Reported  on 09/15/2014 03/25/14   Eugenie Filler, MD  ondansetron (ZOFRAN) 4 MG tablet Take 1 tablet (4 mg total) by mouth every 8 (eight) hours as needed for nausea or vomiting. 07/16/14   Rolland Porter, MD  oxyCODONE-acetaminophen (PERCOCET) 5-325 MG per tablet Take 1-2 tablets by mouth every 4 (four) hours as needed. 09/23/14   Orpah Greek, MD  pantoprazole (PROTONIX) 40 MG tablet TAKE ONE TABLET BY MOUTH ONCE DAILY 09/02/14   Tammi Sou, MD  promethazine (PHENERGAN) 25 MG tablet Take 1 tablet (25 mg total) by mouth every 6 (six) hours as needed for nausea or vomiting. 08/24/14   [place], DO  Sod Picosulfate-Mag Ox-Cit Acd 10-3.5-12 MG-GM-GM PACK Take 1 Container by mouth as directed. 09/15/14   Daneil Dolin, MD   BP 142/77 mmHg  Pulse 107  Temp(Src) 98.5 F (36.9 C) (Oral)  Resp 18  Ht 5' (1.524 m)  Wt 232 lb (105.235 kg)  BMI 45.31 kg/m2  SpO2 97%  LMP 08/31/2014 Physical Exam  2115: Physical examination:  Nursing notes reviewed; Vital signs and O2 SAT reviewed;  Constitutional: Well developed, Well nourished, Well hydrated, In no acute distress; Head:  Normocephalic, atraumatic; Eyes: EOMI, PERRL, No scleral icterus; ENMT: Mouth and pharynx normal, Mucous membranes moist; Neck: Supple, Full range of motion; Cardiovascular: Regular rate and rhythm; Respiratory: Breath sounds clear, No wheezes.  Speaking full sentences  with ease, Normal respiratory effort/excursion; Chest: Movement normal; Abdomen: Nondistended;; Extremities: No deformity..; Neuro: AA&Ox3, Major CN grossly intact.  Speech clear. Climbs on and off stretcher easily by herself. Gait steady..; Skin: Color normal, Warm, Dry.; Psych:  Full affect. Calm/cooperative. Denies SI.   ED Course  Procedures     EKG Interpretation None      MDM  MDM Reviewed: previous chart, nursing note and vitals Reviewed previous: labs Interpretation: labs   Results for orders placed or performed during the hospital encounter of 09/25/14  CBC with Differential  Result Value Ref Range   WBC 10.2 4.0 - 10.5 K/uL   RBC 5.01 3.87 - 5.11 MIL/uL   Hemoglobin 14.2 12.0 - 15.0 g/dL   HCT 42.8 36.0 - 46.0 %   MCV 85.4 78.0 - 100.0 fL   MCH 28.3 26.0 - 34.0 pg   MCHC 33.2 30.0 - 36.0 g/dL   RDW 15.6 (H) 11.5 - 15.5 %   Platelets 273 150 - 400 K/uL   Neutrophils Relative % 71 43 - 77 %   Neutro Abs 7.3 1.7 - 7.7 K/uL   Lymphocytes Relative 24 12 - 46 %   Lymphs Abs 2.5 0.7 - 4.0 K/uL   Monocytes Relative 4 3 - 12 %   Monocytes Absolute 0.4 0.1 - 1.0 K/uL   Eosinophils Relative 1 0 - 5 %   Eosinophils Absolute 0.1 0.0 - 0.7 K/uL   Basophils Relative 0 0 - 1 %   Basophils Absolute 0.0 0.0 - 0.1 K/uL  Basic metabolic panel  Result Value Ref Range   Sodium 140 135 - 145 mmol/L   Potassium 3.9 3.5 - 5.1 mmol/L   Chloride 106 101 - 111 mmol/L   CO2 25 22 - 32 mmol/L   Glucose, Bld 90 65 - 99 mg/dL   BUN 9 6 - 20 mg/dL   Creatinine, Ser 0.63 0.44 - 1.00 mg/dL   Calcium 9.0 8.9 - 10.3 mg/dL   GFR calc non Af Amer >60 >60 mL/min   GFR calc Af Amer >60 >  60 mL/min   Anion gap 9 5 - 15  Ethanol  Result Value Ref Range   Alcohol, Ethyl (B) <5 <5 mg/dL  Pregnancy, urine  Result Value Ref Range   Preg Test, Ur NEGATIVE NEGATIVE  Urine rapid drug screen (hosp performed)  Result Value Ref Range   Opiates NONE DETECTED NONE DETECTED   Cocaine NONE DETECTED  NONE DETECTED   Benzodiazepines NONE DETECTED NONE DETECTED   Amphetamines NONE DETECTED NONE DETECTED   Tetrahydrocannabinol NONE DETECTED NONE DETECTED   Barbiturates NONE DETECTED NONE DETECTED     2240:  TTS has evaluated pt: appears to be "he said, she said" situation, pt denies SI, pt does not meet inpt criteria at this time, IVC can be rescinded and pt can be d/c home with f/u with her provider at [facility]. Pt and family are in agreement with this plan. Will d/c pt stable.        Francine Graven, DO 09/27/14 2052

## 2014-10-04 ENCOUNTER — Other Ambulatory Visit: Payer: Self-pay | Admitting: Nurse Practitioner

## 2014-10-04 ENCOUNTER — Other Ambulatory Visit: Payer: Self-pay | Admitting: Family Medicine

## 2014-10-04 MED ORDER — CLONAZEPAM 0.5 MG PO TABS: ORAL_TABLET | ORAL | Status: DC

## 2014-10-04 NOTE — Telephone Encounter (Signed)
Klonopin 0.5mg  lov- 06-22-14 Nov- 10-06-14 Last written - 09-10-14 #60 0 refills

## 2014-10-04 NOTE — Telephone Encounter (Signed)
i think this one is yours

## 2014-10-06 ENCOUNTER — Ambulatory Visit: Payer: 59 | Admitting: Family Medicine

## 2014-10-12 ENCOUNTER — Emergency Department (HOSPITAL_COMMUNITY)
Admission: EM | Admit: 2014-10-12 | Discharge: 2014-10-13 | Disposition: A | Discharge status: Home / Self Care | Payer: 59 | Attending: Emergency Medicine | Admitting: Emergency Medicine

## 2014-10-12 ENCOUNTER — Emergency Department (HOSPITAL_COMMUNITY): Payer: 59

## 2014-10-12 ENCOUNTER — Encounter (HOSPITAL_COMMUNITY): Payer: Self-pay | Admitting: Emergency Medicine

## 2014-10-12 DIAGNOSIS — J189 Pneumonia, unspecified organism: Secondary | ICD-10-CM

## 2014-10-12 DIAGNOSIS — Z91040 Latex allergy status: Secondary | ICD-10-CM | POA: Insufficient documentation

## 2014-10-12 DIAGNOSIS — Z7951 Long term (current) use of inhaled steroids: Secondary | ICD-10-CM | POA: Insufficient documentation

## 2014-10-12 DIAGNOSIS — Z862 Personal history of diseases of the blood and blood-forming organs and certain disorders involving the immune mechanism: Secondary | ICD-10-CM | POA: Insufficient documentation

## 2014-10-12 DIAGNOSIS — Z720 Tobacco use: Secondary | ICD-10-CM | POA: Insufficient documentation

## 2014-10-12 DIAGNOSIS — F319 Bipolar disorder, unspecified: Secondary | ICD-10-CM | POA: Diagnosis not present

## 2014-10-12 DIAGNOSIS — J45901 Unspecified asthma with (acute) exacerbation: Secondary | ICD-10-CM | POA: Insufficient documentation

## 2014-10-12 DIAGNOSIS — Z8619 Personal history of other infectious and parasitic diseases: Secondary | ICD-10-CM | POA: Diagnosis not present

## 2014-10-12 DIAGNOSIS — Z791 Long term (current) use of non-steroidal anti-inflammatories (NSAID): Secondary | ICD-10-CM | POA: Diagnosis not present

## 2014-10-12 DIAGNOSIS — R079 Chest pain, unspecified: Secondary | ICD-10-CM | POA: Diagnosis present

## 2014-10-12 DIAGNOSIS — J159 Unspecified bacterial pneumonia: Secondary | ICD-10-CM | POA: Diagnosis not present

## 2014-10-12 DIAGNOSIS — Z7982 Long term (current) use of aspirin: Secondary | ICD-10-CM | POA: Diagnosis not present

## 2014-10-12 DIAGNOSIS — Z9889 Other specified postprocedural states: Secondary | ICD-10-CM | POA: Insufficient documentation

## 2014-10-12 DIAGNOSIS — Z79899 Other long term (current) drug therapy: Secondary | ICD-10-CM | POA: Diagnosis not present

## 2014-10-12 DIAGNOSIS — I10 Essential (primary) hypertension: Secondary | ICD-10-CM | POA: Diagnosis not present

## 2014-10-12 DIAGNOSIS — Z880 Allergy status to penicillin: Secondary | ICD-10-CM | POA: Diagnosis not present

## 2014-10-12 DIAGNOSIS — M1990 Unspecified osteoarthritis, unspecified site: Secondary | ICD-10-CM | POA: Insufficient documentation

## 2014-10-12 DIAGNOSIS — K219 Gastro-esophageal reflux disease without esophagitis: Secondary | ICD-10-CM | POA: Insufficient documentation

## 2014-10-12 DIAGNOSIS — Z8669 Personal history of other diseases of the nervous system and sense organs: Secondary | ICD-10-CM | POA: Diagnosis not present

## 2014-10-12 LAB — CBC
HCT: 42.2 % (ref 36.0–46.0)
Hemoglobin: 14 g/dL (ref 12.0–15.0)
MCH: 28.3 pg (ref 26.0–34.0)
MCHC: 33.2 g/dL (ref 30.0–36.0)
MCV: 85.3 fL (ref 78.0–100.0)
Platelets: 285 10*3/uL (ref 150–400)
RBC: 4.95 MIL/uL (ref 3.87–5.11)
RDW: 15.5 % (ref 11.5–15.5)
WBC: 8.5 10*3/uL (ref 4.0–10.5)

## 2014-10-12 LAB — DIFFERENTIAL
Basophils Absolute: 0 10*3/uL (ref 0.0–0.1)
Basophils Relative: 0 % (ref 0–1)
Eosinophils Absolute: 0.1 10*3/uL (ref 0.0–0.7)
Eosinophils Relative: 1 % (ref 0–5)
Lymphocytes Relative: 29 % (ref 12–46)
Lymphs Abs: 2.4 10*3/uL (ref 0.7–4.0)
Monocytes Absolute: 0.5 10*3/uL (ref 0.1–1.0)
Monocytes Relative: 6 % (ref 3–12)
Neutro Abs: 5.3 10*3/uL (ref 1.7–7.7)
Neutrophils Relative %: 64 % (ref 43–77)

## 2014-10-12 LAB — BASIC METABOLIC PANEL
Anion gap: 9 (ref 5–15)
BUN: 10 mg/dL (ref 6–20)
CO2: 26 mmol/L (ref 22–32)
Calcium: 8.9 mg/dL (ref 8.9–10.3)
Chloride: 106 mmol/L (ref 101–111)
Creatinine, Ser: 0.8 mg/dL (ref 0.44–1.00)
GFR calc Af Amer: >60 mL/min (ref >60)
GFR calc non Af Amer: >60 mL/min (ref >60)
Glucose, Bld: 114 mg/dL — ABNORMAL HIGH (ref 65–99)
Potassium: 3.7 mmol/L (ref 3.5–5.1)
Sodium: 141 mmol/L (ref 135–145)

## 2014-10-12 LAB — TROPONIN I: Troponin I: <0.03 ng/mL (ref <0.031)

## 2014-10-12 NOTE — ED Notes (Signed)
Patient complaining of cough with shortness of breath and chest pain. Reports chest pain worsened today.

## 2014-10-12 NOTE — ED Provider Notes (Signed)
CSN: [id number]     Arrival date & time 10/12/14  2224 History  This chart was scribed for Delora Fuel, MD by Helane Gunther, ED Scribe. This patient was seen in room APA11/APA11 and the patient's care was started at 11:53 PM.    Chief Complaint  Patient presents with  . Chest Pain  . Cough   The history is provided by the patient. No language interpreter was used.   HPI Comments: Martha Espinoza is a 49 y.o. female smoker at 0.5 ppd who presents to the Emergency Department complaining of inspiratory CP and non productivecough onset. She rates her pain as an 8-9/10. She reports associated SOB, congestion, chills, subjective fever. Pt notes yellowish nasal discharge. She has been taking Nyquil and Dayquil, as well as using her inhaler with mild relief.   Past Medical History  Diagnosis Date  . Polycystic ovarian syndrome   . Colitis due to Clostridium difficile 2001  . Hepatic hemangioma   . Anxiety   . IBS (irritable bowel syndrome)   . History of Salmonella gastroenteritis   . GERD (gastroesophageal reflux disease)   . Rectal cancer     a. Followed by Dr. Gala Romney, dx 2000-2001. Tumor removed from rectal.  . Bipolar 1 disorder   . Migraine headache   . Asthma   . Arthritis   . Hypertension     a. Improved without intervention.  . Allergy   . Tobacco dependence   . Hyperlipidemia     a. Noted 02/2014.  . Morbid obesity   . Microscopic hematuria     Intermittent (no w/u done yet, as of 03/2014)  . Pruritic dermatitis 2015/2016    +Scabies prep at [facility] 07/2014. (Primarily pruritic skin, but eventually a subtle rash as well)  . Iron deficiency anemia 05/2014    per pt it is not due to vaginal blood loss; hemoccults sent to pt in mail 413/16.  . Vitamin D deficiency    Past Surgical History  Procedure Laterality Date  . Cholecystectomy    . Colonoscopy  05/05/2007    adenoma  . Situ removed  age 28    High-grade rectal adenoma removed from rectum   . Dilation and  curettage of uterus      for vaginal bleeding  . Tubal ligation    . Cardiovascular stress test  03/24/14    Lexscan MIBI: mild anterior ischemia?  Cardiac CT angiogram recommended/done.  . Transthoracic echocardiogram  03/23/14    Normal  . Left heart catheterization with coronary angiogram N/A 03/31/2014    Normal coronaries, EF 30-07%, diastolic dysfunction.  Procedure: LEFT HEART CATHETERIZATION WITH CORONARY ANGIOGRAM;  Surgeon: Sinclair Grooms, MD;  Location: [facility] CATH LAB;  Service: Cardiovascular;  Laterality: N/A;  . Coronary ct angio  03/29/14    Two vessel dz/moderate stenosis of mid LAD and proximal RCA; cath recommended.   Family History  Problem Relation Age of Onset  . Colon cancer Father 4  . Cancer Father   . Diverticulitis Mother   . Heart disease Mother   . Hypertension Mother   . Hyperlipidemia Mother   . Mental illness Mother   . Diabetes Mother   . Heart attack Mother   . Stroke Neg Hx    History  Substance Use Topics  . Smoking status: Current Every Day Smoker -- 0.50 packs/day for 18 years    Types: Cigarettes  . Smokeless tobacco: Never Used     Comment: 25  years  . Alcohol Use: No   OB History    Gravida Para Term Preterm AB TAB SAB Ectopic Multiple Living   4 1 1  3  3   1      Review of Systems  Constitutional: Positive for chills.  HENT: Positive for congestion.   Respiratory: Positive for cough and shortness of breath.   Cardiovascular: Positive for chest pain.  All other systems reviewed and are negative.   Allergies  Contrast media; Shellfish allergy; Amoxil; Codeine; Latex; Lipitor; and Permethrin  Home Medications   Prior to Admission medications   Medication Sig Start Date End Date Taking? Authorizing Provider  albuterol (VENTOLIN HFA) 108 (90 BASE) MCG/ACT inhaler Inhale 2 puffs into the lungs every 6 (six) hours as needed for wheezing or shortness of breath. 12/02/13   Tammi Sou, MD  aspirin 81 MG chewable tablet Chew 1  tablet (81 mg total) by mouth daily. 09/06/12   Ripley Fraise, MD  busPIRone (BUSPAR) 15 MG tablet Take 30 mg by mouth at bedtime.     Historical Provider, MD  Cetirizine HCl (ZYRTEC ALLERGY) 10 MG CAPS Take 1 capsule (10 mg total) by mouth daily. 08/24/14   Kristen N Ward, DO  clonazePAM (KLONOPIN) 0.5 MG tablet Takes 1 tablet during the day as needed and 2 tablets at bedtime. 10/04/14   Tammi Sou, MD  ergocalciferol (VITAMIN D2) 50000 UNITS capsule Take 1 capsule (50,000 Units total) by mouth once a week. Patient taking differently: Take 1 capsule by mouth once a week. Takes on Thursday 07/01/14   Tammi Sou, MD  fluticasone (FLONASE) 50 MCG/ACT nasal spray Place 2 sprays into both nostrils daily. 08/24/14   Kristen N Ward, DO  hydrocortisone cream 1 % Apply topically 3 (three) times daily. Patient taking differently: Apply 1 application topically daily as needed for itching.  03/25/14   Eugenie Filler, MD  hydrOXYzine (ATARAX/VISTARIL) 25 MG tablet Take 25 mg by mouth 3 (three) times daily.     Historical Provider, MD  ibuprofen (ADVIL,MOTRIN) 800 MG tablet Take 1 tablet (800 mg total) by mouth 3 (three) times daily. 06/28/14   Lily Kocher, PA-C  lamoTRIgine (LAMICTAL) 200 MG tablet Take 200 mg by mouth at bedtime.    Historical Provider, MD  nitroGLYCERIN (NITROSTAT) 0.4 MG SL tablet Place 1 tablet (0.4 mg total) under the tongue every 5 (five) minutes as needed for chest pain. 03/25/14   Eugenie Filler, MD  ondansetron (ZOFRAN) 4 MG tablet Take 1 tablet (4 mg total) by mouth every 8 (eight) hours as needed for nausea or vomiting. 07/16/14   Rolland Porter, MD  oxyCODONE-acetaminophen (PERCOCET) 5-325 MG per tablet Take 1-2 tablets by mouth every 4 (four) hours as needed. Patient taking differently: Take 1-2 tablets by mouth every 4 (four) hours as needed for moderate pain.  09/23/14   Orpah Greek, MD  pantoprazole (PROTONIX) 40 MG tablet TAKE ONE TABLET BY MOUTH ONCE DAILY  10/04/14   Tammi Sou, MD  promethazine (PHENERGAN) 25 MG tablet Take 1 tablet (25 mg total) by mouth every 6 (six) hours as needed for nausea or vomiting. 08/24/14   [place], DO  Sod Picosulfate-Mag Ox-Cit Acd 10-3.5-12 MG-GM-GM PACK Take 1 Container by mouth as directed. 09/15/14   Daneil Dolin, MD   BP 148/93 mmHg  Pulse 90  Temp(Src) 97.5 F (36.4 C) (Oral)  Resp 20  Ht 5' (1.524 m)  Wt 230 lb (104.327  kg)  BMI 44.92 kg/m2  SpO2 100%  LMP 09/28/2014 Physical Exam  Constitutional: She is oriented to person, place, and time. She appears well-developed and well-nourished. No distress.  HENT:  Head: Normocephalic and atraumatic.  Mouth/Throat: Oropharynx is clear and moist.  TTP over maxillary and frontal sinuses. No nasal drainage or edema of turbinates.   Eyes: EOM are normal. Pupils are equal, round, and reactive to light.  Neck: Normal range of motion. Neck supple. No JVD present.  Cardiovascular: Normal rate, regular rhythm and normal heart sounds.   No murmur heard. Pulmonary/Chest: Effort normal. She has no wheezes. She has rales. She exhibits no tenderness.  Few rales present left base. Mild tenderness across the lower anterior chest wall.  Abdominal: Soft. Bowel sounds are normal. She exhibits no distension and no mass. There is no tenderness. There is no guarding.  Musculoskeletal: Normal range of motion. She exhibits no edema.  Lymphadenopathy:    She has no cervical adenopathy.  Neurological: She is alert and oriented to person, place, and time. No cranial nerve deficit. She exhibits normal muscle tone. Coordination normal.  Skin: Skin is warm and dry. No rash noted.  Psychiatric: She has a normal mood and affect. Her behavior is normal. Judgment and thought content normal.  Nursing note and vitals reviewed.   ED Course  Procedures  DIAGNOSTIC STUDIES: Oxygen Saturation is 100% on RA, normal by my interpretation.    COORDINATION OF CARE: 12:01 AM -  Discussed imaging results of beginnings of PNA. Advised to quit smoking. Discussed plans to order antibiotics and prednisone. Recommended use of Aleve for pain. Pt advised of plan for treatment and pt agrees.  Labs Review Results for orders placed or performed during the hospital encounter of 49/44/96  Basic metabolic panel  Result Value Ref Range   Sodium 141 135 - 145 mmol/L   Potassium 3.7 3.5 - 5.1 mmol/L   Chloride 106 101 - 111 mmol/L   CO2 26 22 - 32 mmol/L   Glucose, Bld 114 (H) 65 - 99 mg/dL   BUN 10 6 - 20 mg/dL   Creatinine, Ser 0.80 0.44 - 1.00 mg/dL   Calcium 8.9 8.9 - 10.3 mg/dL   GFR calc non Af Amer >60 >60 mL/min   GFR calc Af Amer >60 >60 mL/min   Anion gap 9 5 - 15  CBC  Result Value Ref Range   WBC 8.5 4.0 - 10.5 K/uL   RBC 4.95 3.87 - 5.11 MIL/uL   Hemoglobin 14.0 12.0 - 15.0 g/dL   HCT 42.2 36.0 - 46.0 %   MCV 85.3 78.0 - 100.0 fL   MCH 28.3 26.0 - 34.0 pg   MCHC 33.2 30.0 - 36.0 g/dL   RDW 15.5 11.5 - 15.5 %   Platelets 285 150 - 400 K/uL  Troponin I  Result Value Ref Range   Troponin I <0.03 <0.031 ng/mL  Differential  Result Value Ref Range   Neutrophils Relative % 64 43 - 77 %   Neutro Abs 5.3 1.7 - 7.7 K/uL   Lymphocytes Relative 29 12 - 46 %   Lymphs Abs 2.4 0.7 - 4.0 K/uL   Monocytes Relative 6 3 - 12 %   Monocytes Absolute 0.5 0.1 - 1.0 K/uL   Eosinophils Relative 1 0 - 5 %   Eosinophils Absolute 0.1 0.0 - 0.7 K/uL   Basophils Relative 0 0 - 1 %   Basophils Absolute 0.0 0.0 - 0.1 K/uL   Imaging  Review Dg Chest 2 View  10/12/2014   CLINICAL DATA:  Acute onset of generalized chest tightness, cough and increasing shortness of breath. Initial encounter.  EXAM: CHEST  2 VIEW  COMPARISON:  Chest radiograph performed 07/16/2014  FINDINGS: The lungs are mildly hypoexpanded. Mild left basilar opacity likely reflects atelectasis, though mild pneumonia might have a similar appearance. No pleural effusion or pneumothorax is seen.  The heart is  borderline normal in size. No acute osseous abnormalities are seen. Clips are noted within the right upper quadrant, reflecting prior cholecystectomy.  IMPRESSION: Lungs mildly hypoexpanded. Mild left basilar airspace opacity likely reflects atelectasis, though mild pneumonia might have a similar appearance.   Electronically Signed   By: Garald Balding M.D.   On: 10/12/2014 23:36     EKG Interpretation   Date/Time:  Tuesday October 12 2014 22:44:21 EDT Ventricular Rate:  92 PR Interval:  160 QRS Duration: 96 QT Interval:  363 QTC Calculation: 449 R Axis:   15 Text Interpretation:  Sinus rhythm Probable anterior infarct, old When  compared with ECG of 03/24/2014, No significant change was found Confirmed  by [facility]  MD, Zaylin Pistilli ([id number]) on 10/12/2014 11:39:36 PM      MDM   Final diagnoses:  Community acquired pneumonia    Respiratory tract infection which has not responded to over-the-counter medications and inhalers. Chest x-ray is suspicious for pneumonia at the left base. Rales are heard at that same location so I do believe that she does have early pneumonia. Also, possible sinusitis given tenderness over frontal and maxillary sinuses. She is discharged with prescriptions for prednisone and amoxicillin and is to follow-up with her PCP if she is not showing clinical improvement in the next several days. Continue to use her inhaler as needed and take NSAIDs for pain.  I personally performed the services described in this documentation, which was scribed in my presence. The recorded information has been reviewed and is accurate.     Delora Fuel, MD 29/19/16 [id number]

## 2014-10-13 ENCOUNTER — Encounter (HOSPITAL_COMMUNITY): Admission: RE | Payer: Self-pay | Source: Ambulatory Visit

## 2014-10-13 ENCOUNTER — Ambulatory Visit (HOSPITAL_COMMUNITY): Admission: RE | Admit: 2014-10-13 | Payer: 59 | Source: Ambulatory Visit | Admitting: Internal Medicine

## 2014-10-13 SURGERY — COLONOSCOPY
Anesthesia: Moderate Sedation

## 2014-10-13 MED ORDER — AMOXICILLIN 250 MG PO CAPS
1000.0000 mg | ORAL_CAPSULE | Freq: Once | ORAL | Status: AC
  Administered 2014-10-13: 1000 mg via ORAL
  Filled 2014-10-13: qty 4

## 2014-10-13 MED ORDER — PREDNISONE 50 MG PO TABS: 50.0000 mg | ORAL_TABLET | Freq: Every day | ORAL | Status: DC

## 2014-10-13 MED ORDER — PREDNISONE 50 MG PO TABS
60.0000 mg | ORAL_TABLET | Freq: Once | ORAL | Status: AC
  Administered 2014-10-13: 60 mg via ORAL
  Filled 2014-10-13 (×2): qty 1

## 2014-10-13 MED ORDER — AMOXICILLIN 500 MG PO CAPS: 1000.0000 mg | ORAL_CAPSULE | Freq: Two times a day (BID) | ORAL | Status: DC

## 2014-10-13 NOTE — Discharge Instructions (Signed)
Stop smoking! Continue using your inhaler as needed. Take ibuprofen or naproxen as needed for pain.  Pneumonia Pneumonia is an infection of the lungs.  CAUSES Pneumonia may be caused by bacteria or a virus. Usually, these infections are caused by breathing infectious particles into the lungs (respiratory tract). SIGNS AND SYMPTOMS   Cough.  Fever.  Chest pain.  Increased rate of breathing.  Wheezing.  Mucus production. DIAGNOSIS  If you have the common symptoms of pneumonia, your health care provider will typically confirm the diagnosis with a chest X-ray. The X-ray will show an abnormality in the lung (pulmonary infiltrate) if you have pneumonia. Other tests of your blood, urine, or sputum may be done to find the specific cause of your pneumonia. Your health care provider may also do tests (blood gases or pulse oximetry) to see how well your lungs are working. TREATMENT  Some forms of pneumonia may be spread to other people when you cough or sneeze. You may be asked to wear a mask before and during your exam. Pneumonia that is caused by bacteria is treated with antibiotic medicine. Pneumonia that is caused by the influenza virus may be treated with an antiviral medicine. Most other viral infections must run their course. These infections will not respond to antibiotics.  HOME CARE INSTRUCTIONS   Cough suppressants may be used if you are losing too much rest. However, coughing protects you by clearing your lungs. You should avoid using cough suppressants if you can.  Your health care provider may have prescribed medicine if he or she thinks your pneumonia is caused by bacteria or influenza. Finish your medicine even if you start to feel better.  Your health care provider may also prescribe an expectorant. This loosens the mucus to be coughed up.  Take medicines only as directed by your health care provider.  Do not smoke. Smoking is a common cause of bronchitis and can contribute to  pneumonia. If you are a smoker and continue to smoke, your cough may last several weeks after your pneumonia has cleared.  A cold steam vaporizer or humidifier in your room or home may help loosen mucus.  Coughing is often worse at night. Sleeping in a semi-upright position in a recliner or using a couple pillows under your head will help with this.  Get rest as you feel it is needed. Your body will usually let you know when you need to rest. PREVENTION A pneumococcal shot (vaccine) is available to prevent a common bacterial cause of pneumonia. This is usually suggested for:  People over 26 years old.  Patients on chemotherapy.  People with chronic lung problems, such as bronchitis or emphysema.  People with immune system problems. If you are over 65 or have a high risk condition, you may receive the pneumococcal vaccine if you have not received it before. In some countries, a routine influenza vaccine is also recommended. This vaccine can help prevent some cases of pneumonia.You may be offered the influenza vaccine as part of your care. If you smoke, it is time to quit. You may receive instructions on how to stop smoking. Your health care provider can provide medicines and counseling to help you quit. SEEK MEDICAL CARE IF: You have a fever. SEEK IMMEDIATE MEDICAL CARE IF:   Your illness becomes worse. This is especially true if you are elderly or weakened from any other disease.  You cannot control your cough with suppressants and are losing sleep.  You begin coughing up blood.  You develop pain which is getting worse or is uncontrolled with medicines.  Any of the symptoms which initially brought you in for treatment are getting worse rather than better.  You develop shortness of breath or chest pain. MAKE SURE YOU:   Understand these instructions.  Will watch your condition.  Will get help right away if you are not doing well or get worse. Document Released: 03/12/2005  Document Revised: 07/27/2013 Document Reviewed: 06/01/2010 [facility] Patient Information 2015 [place], [place]. This information is not intended to replace advice given to you by your health care provider. Make sure you discuss any questions you have with your health care provider.  Amoxicillin capsules or tablets What is this medicine? AMOXICILLIN (a mox i SIL in) is a penicillin antibiotic. It is used to treat certain kinds of bacterial infections. It will not work for colds, flu, or other viral infections. This medicine may be used for other purposes; ask your health care provider or pharmacist if you have questions. COMMON BRAND NAME(S): Amoxil, Moxilin, Sumox, Trimox What should I tell my health care provider before I take this medicine? They need to know if you have any of these conditions: -asthma -kidney disease -an unusual or allergic reaction to amoxicillin, other penicillins, cephalosporin antibiotics, other medicines, foods, dyes, or preservatives -pregnant or trying to get pregnant -breast-feeding How should I use this medicine? Take this medicine by mouth with a glass of water. Follow the directions on your prescription label. You may take this medicine with food or on an empty stomach. Take your medicine at regular intervals. Do not take your medicine more often than directed. Take all of your medicine as directed even if you think your are better. Do not skip doses or stop your medicine early. Talk to your pediatrician regarding the use of this medicine in children. While this drug may be prescribed for selected conditions, precautions do apply. Overdosage: If you think you have taken too much of this medicine contact a poison control center or emergency room at once. NOTE: This medicine is only for you. Do not share this medicine with others. What if I miss a dose? If you miss a dose, take it as soon as you can. If it is almost time for your next dose, take only that dose. Do not  take double or extra doses. What may interact with this medicine? -amiloride -birth control pills -chloramphenicol -macrolides -probenecid -sulfonamides -tetracyclines This list may not describe all possible interactions. Give your health care provider a list of all the medicines, herbs, non-prescription drugs, or dietary supplements you use. Also tell them if you smoke, drink alcohol, or use illegal drugs. Some items may interact with your medicine. What should I watch for while using this medicine? Tell your doctor or health care professional if your symptoms do not improve in 2 or 3 days. Take all of the doses of your medicine as directed. Do not skip doses or stop your medicine early. If you are diabetic, you may get a false positive result for sugar in your urine with certain brands of urine tests. Check with your doctor. Do not treat diarrhea with over-the-counter products. Contact your doctor if you have diarrhea that lasts more than 2 days or if the diarrhea is severe and watery. What side effects may I notice from receiving this medicine? Side effects that you should report to your doctor or health care professional as soon as possible: -allergic reactions like skin rash, itching or hives, swelling of the  face, lips, or tongue -breathing problems -dark urine -redness, blistering, peeling or loosening of the skin, including inside the mouth -seizures -severe or watery diarrhea -trouble passing urine or change in the amount of urine -unusual bleeding or bruising -unusually weak or tired -yellowing of the eyes or skin Side effects that usually do not require medical attention (report to your doctor or health care professional if they continue or are bothersome): -dizziness -headache -stomach upset -trouble sleeping This list may not describe all possible side effects. Call your doctor for medical advice about side effects. You may report side effects to FDA at  1-800-FDA-1088. Where should I keep my medicine? Keep out of the reach of children. Store between 68 and 77 degrees F (20 and 25 degrees C). Keep bottle closed tightly. Throw away any unused medicine after the expiration date. NOTE: This sheet is a summary. It may not cover all possible information. If you have questions about this medicine, talk to your doctor, pharmacist, or health care provider.  2015, [employer] Standard. (2007-06-03 14:10:59)  Prednisone tablets What is this medicine? PREDNISONE (PRED ni sone) is a corticosteroid. It is commonly used to treat inflammation of the skin, joints, lungs, and other organs. Common conditions treated include asthma, allergies, and arthritis. It is also used for other conditions, such as blood disorders and diseases of the adrenal glands. This medicine may be used for other purposes; ask your health care provider or pharmacist if you have questions. COMMON BRAND NAME(S): Deltasone, Predone, Sterapred, Sterapred DS What should I tell my health care provider before I take this medicine? They need to know if you have any of these conditions: -Cushing's syndrome -diabetes -glaucoma -heart disease -high blood pressure -infection (especially a virus infection such as chickenpox, cold sores, or herpes) -kidney disease -liver disease -mental illness -myasthenia gravis -osteoporosis -seizures -stomach or intestine problems -thyroid disease -an unusual or allergic reaction to lactose, prednisone, other medicines, foods, dyes, or preservatives -pregnant or trying to get pregnant -breast-feeding How should I use this medicine? Take this medicine by mouth with a glass of water. Follow the directions on the prescription label. Take this medicine with food. If you are taking this medicine once a day, take it in the morning. Do not take more medicine than you are told to take. Do not suddenly stop taking your medicine because you may develop a severe  reaction. Your doctor will tell you how much medicine to take. If your doctor wants you to stop the medicine, the dose may be slowly lowered over time to avoid any side effects. Talk to your pediatrician regarding the use of this medicine in children. Special care may be needed. Overdosage: If you think you have taken too much of this medicine contact a poison control center or emergency room at once. NOTE: This medicine is only for you. Do not share this medicine with others. What if I miss a dose? If you miss a dose, take it as soon as you can. If it is almost time for your next dose, talk to your doctor or health care professional. You may need to miss a dose or take an extra dose. Do not take double or extra doses without advice. What may interact with this medicine? Do not take this medicine with any of the following medications: -metyrapone -mifepristone This medicine may also interact with the following medications: -aminoglutethimide -amphotericin B -aspirin and aspirin-like medicines -barbiturates -certain medicines for diabetes, like glipizide or glyburide -cholestyramine -cholinesterase inhibitors -cyclosporine -digoxin -  diuretics -ephedrine -female hormones, like estrogens and birth control pills -isoniazid -ketoconazole -NSAIDS, medicines for pain and inflammation, like ibuprofen or naproxen -phenytoin -rifampin -toxoids -vaccines -warfarin This list may not describe all possible interactions. Give your health care provider a list of all the medicines, herbs, non-prescription drugs, or dietary supplements you use. Also tell them if you smoke, drink alcohol, or use illegal drugs. Some items may interact with your medicine. What should I watch for while using this medicine? Visit your doctor or health care professional for regular checks on your progress. If you are taking this medicine over a prolonged period, carry an identification card with your name and address, the  type and dose of your medicine, and your doctor's name and address. This medicine may increase your risk of getting an infection. Tell your doctor or health care professional if you are around anyone with measles or chickenpox, or if you develop sores or blisters that do not heal properly. If you are going to have surgery, tell your doctor or health care professional that you have taken this medicine within the last twelve months. Ask your doctor or health care professional about your diet. You may need to lower the amount of salt you eat. This medicine may affect blood sugar levels. If you have diabetes, check with your doctor or health care professional before you change your diet or the dose of your diabetic medicine. What side effects may I notice from receiving this medicine? Side effects that you should report to your doctor or health care professional as soon as possible: -allergic reactions like skin rash, itching or hives, swelling of the face, lips, or tongue -changes in emotions or moods -changes in vision -depressed mood -eye pain -fever or chills, cough, sore throat, pain or difficulty passing urine -increased thirst -swelling of ankles, feet Side effects that usually do not require medical attention (report to your doctor or health care professional if they continue or are bothersome): -confusion, excitement, restlessness -headache -nausea, vomiting -skin problems, acne, thin and shiny skin -trouble sleeping -weight gain This list may not describe all possible side effects. Call your doctor for medical advice about side effects. You may report side effects to FDA at 1-800-FDA-1088. Where should I keep my medicine? Keep out of the reach of children. Store at room temperature between 15 and 30 degrees C (59 and 86 degrees F). Protect from light. Keep container tightly closed. Throw away any unused medicine after the expiration date. NOTE: This sheet is a summary. It may not  cover all possible information. If you have questions about this medicine, talk to your doctor, pharmacist, or health care provider.  2015, [employer] Standard. (2010-10-26 10:57:14)  Smoking Hazards Smoking cigarettes is extremely bad for your health. Tobacco smoke has over 200 known poisons in it. It contains the poisonous gases nitrogen oxide and carbon monoxide. There are over 60 chemicals in tobacco smoke that cause cancer. Some of the chemicals found in cigarette smoke include:   Cyanide.   Benzene.   Formaldehyde.   Methanol (wood alcohol).   Acetylene (fuel used in welding torches).   Ammonia.  Even smoking lightly shortens your life expectancy by several years. You can greatly reduce the risk of medical problems for you and your family by stopping now. Smoking is the most preventable cause of death and disease in our society. Within days of quitting smoking, your circulation improves, you decrease the risk of having a heart attack, and your lung capacity improves. There  may be some increased phlegm in the first few days after quitting, and it may take months for your lungs to clear up completely. Quitting for 10 years reduces your risk of developing lung cancer to almost that of a nonsmoker.  WHAT ARE THE RISKS OF SMOKING? Cigarette smokers have an increased risk of many serious medical problems, including:  Lung cancer.   Lung disease (such as pneumonia, bronchitis, and emphysema).   Heart attack and chest pain due to the heart not getting enough oxygen (angina).   Heart disease and peripheral blood vessel disease.   Hypertension.   Stroke.   Oral cancer (cancer of the lip, mouth, or voice box).   Bladder cancer.   Pancreatic cancer.   Cervical cancer.   Pregnancy complications, including premature birth.   Stillbirths and smaller newborn babies, birth defects, and genetic damage to sperm.   Early menopause.   Lower estrogen level for  women.   Infertility.   Facial wrinkles.   Blindness.   Increased risk of broken bones (fractures).   Senile dementia.   Stomach ulcers and internal bleeding.   Delayed wound healing and increased risk of complications during surgery. Because of secondhand smoke exposure, children of smokers have an increased risk of the following:   Sudden infant death syndrome (SIDS).   Respiratory infections.   Lung cancer.   Heart disease.   Ear infections.  WHY IS SMOKING ADDICTIVE? Nicotine is the chemical agent in tobacco that is capable of causing addiction or dependence. When you smoke and inhale, nicotine is absorbed rapidly into the bloodstream through your lungs. Both inhaled and noninhaled nicotine may be addictive.  WHAT ARE THE BENEFITS OF QUITTING?  There are many health benefits to quitting smoking. Some are:   The likelihood of developing cancer and heart disease decreases. Health improvements are seen almost immediately.   Blood pressure, pulse rate, and breathing patterns start returning to normal soon after quitting.   People who quit may see an improvement in their overall quality of life.  HOW DO YOU QUIT SMOKING? Smoking is an addiction with both physical and psychological effects, and longtime habits can be hard to change. Your health care provider can recommend:  Programs and community resources, which may include group support, education, or therapy.  Replacement products, such as patches, gum, and nasal sprays. Use these products only as directed. Do not replace cigarette smoking with electronic cigarettes (commonly called e-cigarettes). The safety of e-cigarettes is unknown, and some may contain harmful chemicals. FOR MORE INFORMATION  American Lung Association: www.lung.org  American Cancer Society: www.cancer.org Document Released: 04/19/2004 Document Revised: 12/31/2012 Document Reviewed: 09/01/2012 [facility] Patient Information 2015  [place], [place]. This information is not intended to replace advice given to you by your health care provider. Make sure you discuss any questions you have with your health care provider.

## 2014-10-21 ENCOUNTER — Emergency Department (HOSPITAL_COMMUNITY): Payer: 59

## 2014-10-21 ENCOUNTER — Emergency Department (HOSPITAL_COMMUNITY)
Admission: EM | Admit: 2014-10-21 | Discharge: 2014-10-22 | Disposition: A | Discharge status: Home / Self Care | Payer: 59 | Attending: Emergency Medicine | Admitting: Emergency Medicine

## 2014-10-21 ENCOUNTER — Ambulatory Visit: Payer: 59 | Admitting: Family Medicine

## 2014-10-21 ENCOUNTER — Encounter (HOSPITAL_COMMUNITY): Payer: Self-pay | Admitting: Emergency Medicine

## 2014-10-21 DIAGNOSIS — Z792 Long term (current) use of antibiotics: Secondary | ICD-10-CM | POA: Diagnosis not present

## 2014-10-21 DIAGNOSIS — Z872 Personal history of diseases of the skin and subcutaneous tissue: Secondary | ICD-10-CM | POA: Diagnosis not present

## 2014-10-21 DIAGNOSIS — Z7951 Long term (current) use of inhaled steroids: Secondary | ICD-10-CM | POA: Insufficient documentation

## 2014-10-21 DIAGNOSIS — Z9889 Other specified postprocedural states: Secondary | ICD-10-CM | POA: Insufficient documentation

## 2014-10-21 DIAGNOSIS — Z8619 Personal history of other infectious and parasitic diseases: Secondary | ICD-10-CM | POA: Insufficient documentation

## 2014-10-21 DIAGNOSIS — Z791 Long term (current) use of non-steroidal anti-inflammatories (NSAID): Secondary | ICD-10-CM | POA: Diagnosis not present

## 2014-10-21 DIAGNOSIS — Z7982 Long term (current) use of aspirin: Secondary | ICD-10-CM | POA: Insufficient documentation

## 2014-10-21 DIAGNOSIS — Z91040 Latex allergy status: Secondary | ICD-10-CM | POA: Diagnosis not present

## 2014-10-21 DIAGNOSIS — Z862 Personal history of diseases of the blood and blood-forming organs and certain disorders involving the immune mechanism: Secondary | ICD-10-CM | POA: Insufficient documentation

## 2014-10-21 DIAGNOSIS — E559 Vitamin D deficiency, unspecified: Secondary | ICD-10-CM | POA: Diagnosis not present

## 2014-10-21 DIAGNOSIS — F319 Bipolar disorder, unspecified: Secondary | ICD-10-CM | POA: Diagnosis not present

## 2014-10-21 DIAGNOSIS — J45901 Unspecified asthma with (acute) exacerbation: Secondary | ICD-10-CM | POA: Diagnosis not present

## 2014-10-21 DIAGNOSIS — Z720 Tobacco use: Secondary | ICD-10-CM | POA: Diagnosis not present

## 2014-10-21 DIAGNOSIS — I10 Essential (primary) hypertension: Secondary | ICD-10-CM | POA: Insufficient documentation

## 2014-10-21 DIAGNOSIS — Z85048 Personal history of other malignant neoplasm of rectum, rectosigmoid junction, and anus: Secondary | ICD-10-CM | POA: Insufficient documentation

## 2014-10-21 DIAGNOSIS — Z79899 Other long term (current) drug therapy: Secondary | ICD-10-CM | POA: Insufficient documentation

## 2014-10-21 DIAGNOSIS — Z8701 Personal history of pneumonia (recurrent): Secondary | ICD-10-CM | POA: Insufficient documentation

## 2014-10-21 DIAGNOSIS — K219 Gastro-esophageal reflux disease without esophagitis: Secondary | ICD-10-CM | POA: Diagnosis not present

## 2014-10-21 DIAGNOSIS — R0602 Shortness of breath: Secondary | ICD-10-CM

## 2014-10-21 DIAGNOSIS — F419 Anxiety disorder, unspecified: Secondary | ICD-10-CM | POA: Diagnosis not present

## 2014-10-21 DIAGNOSIS — M1990 Unspecified osteoarthritis, unspecified site: Secondary | ICD-10-CM | POA: Diagnosis not present

## 2014-10-21 DIAGNOSIS — R062 Wheezing: Secondary | ICD-10-CM

## 2014-10-21 LAB — CBC WITH DIFFERENTIAL/PLATELET
Basophils Absolute: 0 10*3/uL (ref 0.0–0.1)
Basophils Relative: 0 % (ref 0–1)
Eosinophils Absolute: 0.1 10*3/uL (ref 0.0–0.7)
Eosinophils Relative: 0 % (ref 0–5)
HCT: 42.3 % (ref 36.0–46.0)
Hemoglobin: 14 g/dL (ref 12.0–15.0)
Lymphocytes Relative: 22 % (ref 12–46)
Lymphs Abs: 2.9 10*3/uL (ref 0.7–4.0)
MCH: 28.3 pg (ref 26.0–34.0)
MCHC: 33.1 g/dL (ref 30.0–36.0)
MCV: 85.5 fL (ref 78.0–100.0)
Monocytes Absolute: 0.5 10*3/uL (ref 0.1–1.0)
Monocytes Relative: 4 % (ref 3–12)
Neutro Abs: 10 10*3/uL — ABNORMAL HIGH (ref 1.7–7.7)
Neutrophils Relative %: 74 % (ref 43–77)
Platelets: 256 10*3/uL (ref 150–400)
RBC: 4.95 MIL/uL (ref 3.87–5.11)
RDW: 15.6 % — ABNORMAL HIGH (ref 11.5–15.5)
WBC: 13.5 10*3/uL — ABNORMAL HIGH (ref 4.0–10.5)

## 2014-10-21 LAB — BASIC METABOLIC PANEL
Anion gap: 10 (ref 5–15)
BUN: 11 mg/dL (ref 6–20)
CO2: 25 mmol/L (ref 22–32)
Calcium: 8.9 mg/dL (ref 8.9–10.3)
Chloride: 105 mmol/L (ref 101–111)
Creatinine, Ser: 0.82 mg/dL (ref 0.44–1.00)
GFR calc Af Amer: >60 mL/min (ref >60)
GFR calc non Af Amer: >60 mL/min (ref >60)
Glucose, Bld: 116 mg/dL — ABNORMAL HIGH (ref 65–99)
Potassium: 3.4 mmol/L — ABNORMAL LOW (ref 3.5–5.1)
Sodium: 140 mmol/L (ref 135–145)

## 2014-10-21 LAB — D-DIMER, QUANTITATIVE (NOT AT ARMC): D-Dimer, Quant: 0.32 ug/mL-FEU (ref 0.00–0.48)

## 2014-10-21 LAB — TROPONIN I: Troponin I: <0.03 ng/mL (ref <0.031)

## 2014-10-21 MED ORDER — ALBUTEROL SULFATE (2.5 MG/3ML) 0.083% IN NEBU
5.0000 mg | INHALATION_SOLUTION | Freq: Once | RESPIRATORY_TRACT | Status: AC
  Administered 2014-10-21: 5 mg via RESPIRATORY_TRACT
  Filled 2014-10-21: qty 6

## 2014-10-21 NOTE — ED Provider Notes (Signed)
CSN: [id number]     Arrival date & time 10/21/14  2124 History   First MD Initiated Contact with Patient 10/21/14 2143     Chief Complaint  Patient presents with  . Shortness of Breath     (Consider location/radiation/quality/duration/timing/severity/associated sxs/prior Treatment) HPI   Martha Espinoza is a 49 y.o. female with hx of asthma, anxiety who presents to the Emergency Department complaining of wheezing and shortness of breath.  Symptoms began several hours prior to arrival.  She states that she was treated here nearly 2 weeks ago for pneumonia.  Completed prednisone and still taking amoxil.  States that she was feeling better until today.  She notes sudden onset of wheezing and shortness of breath mostly with exertion.  Was outside for brief time earlier.  She took an albuterol neb prior to arrival with some improvement.  She denies chest pain, fever, vomiting, bloody sputum.  Risk factors include obesity, smoking.  No hx of previous DVT/PE   Past Medical History  Diagnosis Date  . Polycystic ovarian syndrome   . Colitis due to Clostridium difficile 2001  . Hepatic hemangioma   . Anxiety   . IBS (irritable bowel syndrome)   . History of Salmonella gastroenteritis   . GERD (gastroesophageal reflux disease)   . Rectal cancer     a. Followed by Dr. Gala Romney, dx 2000-2001. Tumor removed from rectal.  . Bipolar 1 disorder   . Migraine headache   . Asthma   . Arthritis   . Hypertension     a. Improved without intervention.  . Allergy   . Tobacco dependence   . Hyperlipidemia     a. Noted 02/2014.  . Morbid obesity   . Microscopic hematuria     Intermittent (no w/u done yet, as of 03/2014)  . Pruritic dermatitis 2015/2016    +Scabies prep at [facility] 07/2014. (Primarily pruritic skin, but eventually a subtle rash as well)  . Iron deficiency anemia 05/2014    per pt it is not due to vaginal blood loss; hemoccults sent to pt in mail 413/16.  . Vitamin D deficiency     Past Surgical History  Procedure Laterality Date  . Cholecystectomy    . Colonoscopy  05/05/2007    adenoma  . Situ removed  age 35    High-grade rectal adenoma removed from rectum   . Dilation and curettage of uterus      for vaginal bleeding  . Cardiovascular stress test  03/24/14    Lexscan MIBI: mild anterior ischemia?  Cardiac CT angiogram recommended/done.  . Transthoracic echocardiogram  03/23/14    Normal  . Left heart catheterization with coronary angiogram N/A 03/31/2014    Normal coronaries, EF 19-62%, diastolic dysfunction.  Procedure: LEFT HEART CATHETERIZATION WITH CORONARY ANGIOGRAM;  Surgeon: Sinclair Grooms, MD;  Location: [facility] CATH LAB;  Service: Cardiovascular;  Laterality: N/A;  . Coronary ct angio  03/29/14    Two vessel dz/moderate stenosis of mid LAD and proximal RCA; cath recommended.   Family History  Problem Relation Age of Onset  . Colon cancer Father 52  . Cancer Father   . Diverticulitis Mother   . Heart disease Mother   . Hypertension Mother   . Hyperlipidemia Mother   . Mental illness Mother   . Diabetes Mother   . Heart attack Mother   . Stroke Neg Hx    History  Substance Use Topics  . Smoking status: Current Every Day Smoker --  0.50 packs/day for 18 years    Types: Cigarettes  . Smokeless tobacco: Never Used     Comment: 25 years  . Alcohol Use: No   OB History    Gravida Para Term Preterm AB TAB SAB Ectopic Multiple Living   4 1 1  3  3   1      Review of Systems  Constitutional: Negative for fever, chills and appetite change.  HENT: Negative for congestion, sore throat and trouble swallowing.   Respiratory: Positive for cough, chest tightness, shortness of breath and wheezing.   Cardiovascular: Negative for chest pain.  Gastrointestinal: Negative for nausea, vomiting and abdominal pain.  Genitourinary: Negative for dysuria.  Musculoskeletal: Negative for arthralgias and neck pain.  Skin: Negative for rash.  Neurological: Negative  for dizziness, weakness and numbness.  Hematological: Negative for adenopathy.  All other systems reviewed and are negative.     Allergies  Contrast media; Shellfish allergy; Codeine; Latex; Lipitor; and Permethrin  Home Medications   Prior to Admission medications   Medication Sig Start Date End Date Taking? Authorizing Provider  albuterol (VENTOLIN HFA) 108 (90 BASE) MCG/ACT inhaler Inhale 2 puffs into the lungs every 6 (six) hours as needed for wheezing or shortness of breath. 12/02/13   Tammi Sou, MD  amoxicillin (AMOXIL) 500 MG capsule Take 2 capsules (1,000 mg total) by mouth 2 (two) times daily. 06/15/53   Delora Fuel, MD  aspirin 81 MG chewable tablet Chew 1 tablet (81 mg total) by mouth daily. 09/06/12   Ripley Fraise, MD  busPIRone (BUSPAR) 15 MG tablet Take 30 mg by mouth at bedtime.     Historical Provider, MD  Cetirizine HCl (ZYRTEC ALLERGY) 10 MG CAPS Take 1 capsule (10 mg total) by mouth daily. 08/24/14   Kristen N Ward, DO  clonazePAM (KLONOPIN) 0.5 MG tablet Takes 1 tablet during the day as needed and 2 tablets at bedtime. 10/04/14   Tammi Sou, MD  ergocalciferol (VITAMIN D2) 50000 UNITS capsule Take 1 capsule (50,000 Units total) by mouth once a week. Patient taking differently: Take 1 capsule by mouth once a week. Takes on Thursday 07/01/14   Tammi Sou, MD  fluticasone (FLONASE) 50 MCG/ACT nasal spray Place 2 sprays into both nostrils daily. 08/24/14   Kristen N Ward, DO  hydrocortisone cream 1 % Apply topically 3 (three) times daily. Patient taking differently: Apply 1 application topically daily as needed for itching.  03/25/14   Eugenie Filler, MD  hydrOXYzine (ATARAX/VISTARIL) 25 MG tablet Take 25 mg by mouth 3 (three) times daily.     Historical Provider, MD  ibuprofen (ADVIL,MOTRIN) 800 MG tablet Take 1 tablet (800 mg total) by mouth 3 (three) times daily. 06/28/14   Lily Kocher, PA-C  lamoTRIgine (LAMICTAL) 200 MG tablet Take 200 mg by mouth at  bedtime.    Historical Provider, MD  nitroGLYCERIN (NITROSTAT) 0.4 MG SL tablet Place 1 tablet (0.4 mg total) under the tongue every 5 (five) minutes as needed for chest pain. 03/25/14   Eugenie Filler, MD  ondansetron (ZOFRAN) 4 MG tablet Take 1 tablet (4 mg total) by mouth every 8 (eight) hours as needed for nausea or vomiting. 07/16/14   Rolland Porter, MD  oxyCODONE-acetaminophen (PERCOCET) 5-325 MG per tablet Take 1-2 tablets by mouth every 4 (four) hours as needed. Patient taking differently: Take 1-2 tablets by mouth every 4 (four) hours as needed for moderate pain.  09/23/14   Orpah Greek, MD  pantoprazole (  PROTONIX) 40 MG tablet TAKE ONE TABLET BY MOUTH ONCE DAILY 10/04/14   Tammi Sou, MD  predniSONE (DELTASONE) 50 MG tablet Take 1 tablet (50 mg total) by mouth daily. 2/56/38   Delora Fuel, MD  promethazine (PHENERGAN) 25 MG tablet Take 1 tablet (25 mg total) by mouth every 6 (six) hours as needed for nausea or vomiting. 08/24/14   [place], DO  Sod Picosulfate-Mag Ox-Cit Acd 10-3.5-12 MG-GM-GM PACK Take 1 Container by mouth as directed. 09/15/14   Daneil Dolin, MD   BP 139/81 mmHg  Pulse 114  Temp(Src) 98.7 F (37.1 C) (Oral)  Resp 18  Ht 5' (1.524 m)  Wt 230 lb (104.327 kg)  BMI 44.92 kg/m2  SpO2 97%  LMP 09/28/2014 Physical Exam  Constitutional: She is oriented to person, place, and time. She appears well-developed and well-nourished. No distress.  HENT:  Head: Normocephalic and atraumatic.  Right Ear: Tympanic membrane and ear canal normal.  Left Ear: Tympanic membrane and ear canal normal.  Mouth/Throat: Uvula is midline, oropharynx is clear and moist and mucous membranes are normal. No oropharyngeal exudate.  Eyes: EOM are normal. Pupils are equal, round, and reactive to light.  Neck: Normal range of motion, full passive range of motion without pain and phonation normal. Neck supple.  Cardiovascular: Normal rate, regular rhythm, normal heart sounds and  intact distal pulses.   No murmur heard. Pulmonary/Chest: Effort normal and breath sounds normal. No stridor. No respiratory distress. She has no wheezes. She has no rales. She exhibits no tenderness.  Patient received albuterol nebulizer treatment prior to my exam. No rales or wheezing noted.  Abdominal: She exhibits no distension. There is no tenderness. There is no rebound.  Musculoskeletal: Normal range of motion. She exhibits no edema.  Lymphadenopathy:    She has no cervical adenopathy.  Neurological: She is alert and oriented to person, place, and time. She exhibits normal muscle tone. Coordination normal.  Skin: Skin is warm and dry.  Nursing note and vitals reviewed.   ED Course  Procedures (including critical care time) Labs Review Labs Reviewed  CBC WITH DIFFERENTIAL/PLATELET - Abnormal; Notable for the following:    WBC 13.5 (*)    RDW 15.6 (*)    Neutro Abs 10.0 (*)    All other components within normal limits  BASIC METABOLIC PANEL - Abnormal; Notable for the following:    Potassium 3.4 (*)    Glucose, Bld 116 (*)    All other components within normal limits  TROPONIN I  D-DIMER, QUANTITATIVE (NOT AT [facility])    Imaging Review Dg Chest 2 View  10/21/2014   CLINICAL DATA:  Worsening dyspnea.  Recent diagnosis of pneumonia.  EXAM: CHEST  2 VIEW  COMPARISON:  09/1914  FINDINGS: Unchanged mild linear basilar scarring or atelectasis on the left. The lungs are otherwise clear. There is no effusion. Heart size is normal and unchanged. Hilar and mediastinal contours are unremarkable.  IMPRESSION: No acute cardiopulmonary findings   Electronically Signed   By: Andreas Newport M.D.   On: 10/21/2014 23:14     EKG Interpretation   Date/Time:  Thursday October 21 2014 21:44:18 EDT Ventricular Rate:  113 PR Interval:  142 QRS Duration: 85 QT Interval:  316 QTC Calculation: 433 R Axis:   30 Text Interpretation:  Sinus tachycardia Left atrial enlargement Low  voltage,  precordial leads Borderline T abnormalities, inferior leads  Confirmed by Hazle Coca [phone]) on 10/21/2014 9:47:18 PM  MDM   Final diagnoses:  Shortness of breath  Wheezing    Patient well appearing. Nontoxic. Mild tachycardia present no hypoxia, tachypnea.  No distress.   Lungs CTA on my exam.  Pt received neb tx at home and another prior to my exam.  Reports feeling better.  Labs are reassuring, D-Dimer neg.  Pt feeling better and talking with family members at bedside.  She has ambulated in the dept w/o difficulty or hypoxia.  Sx's felt to be related to anxiety.    Pt appears stable for d/c.  Advised to cont abx, albuterol nebs, and close f/u with her PMD.  She agrees to plan      Kem Parkinson, PA-C 10/22/14 [place], MD 10/23/14 [phone]

## 2014-10-21 NOTE — ED Notes (Signed)
Pt was treated for pneumonia 1.5 weeks ago. Was getting better.  Tonight started having SOB

## 2014-10-22 ENCOUNTER — Ambulatory Visit: Payer: 59 | Admitting: Family Medicine

## 2014-10-22 MED ORDER — ALBUTEROL SULFATE (2.5 MG/3ML) 0.083% IN NEBU: 2.5000 mg | INHALATION_SOLUTION | Freq: Four times a day (QID) | RESPIRATORY_TRACT | Status: DC | PRN

## 2014-10-22 NOTE — ED Notes (Signed)
Pt walked to the bathroom and her O2 was 100% the whole time. Pt stated that she just cant caught her breath.

## 2014-10-22 NOTE — Discharge Instructions (Signed)
Shortness of Breath °Shortness of breath means you have trouble breathing. Shortness of breath needs medical care right away. °HOME CARE  °· Do not smoke. °· Avoid being around chemicals or things (paint fumes, dust) that may bother your breathing. °· Rest as needed. Slowly begin your normal activities. °· Only take medicines as told by your doctor. °· Keep all doctor visits as told. °GET HELP RIGHT AWAY IF:  °· Your shortness of breath gets worse. °· You feel lightheaded, pass out (faint), or have a cough that is not helped by medicine. °· You cough up blood. °· You have pain with breathing. °· You have pain in your chest, arms, shoulders, or belly (abdomen). °· You have a fever. °· You cannot walk up stairs or exercise the way you normally do. °· You do not get better in the time expected. °· You have a hard time doing normal activities even with rest. °· You have problems with your medicines. °· You have any new symptoms. °MAKE SURE YOU: °· Understand these instructions. °· Will watch your condition. °· Will get help right away if you are not doing well or get worse. °Document Released: 08/29/2007 Document Revised: 03/17/2013 Document Reviewed: 05/28/2011 °Exit[employer]® Patient Information ©2015 [employer]. This information is not intended to replace advice given to you by your health care provider. Make sure you discuss any questions you have with your health care provider. ° °

## 2014-10-22 NOTE — ED Notes (Signed)
Discharge instructions given, pt demonstrated teach back and verbal understanding. No concerns voiced.  

## 2014-11-24 ENCOUNTER — Other Ambulatory Visit: Payer: Self-pay | Admitting: *Deleted

## 2014-11-24 MED ORDER — ALBUTEROL SULFATE HFA 108 (90 BASE) MCG/ACT IN AERS: 2.0000 | INHALATION_SPRAY | Freq: Four times a day (QID) | RESPIRATORY_TRACT | Status: DC | PRN

## 2014-11-24 NOTE — Telephone Encounter (Signed)
RF request for proair LOV: 07/07/14 Next ov: None Last written:unknown

## 2014-12-17 ENCOUNTER — Encounter: Payer: Self-pay | Admitting: Family Medicine

## 2014-12-17 ENCOUNTER — Ambulatory Visit (INDEPENDENT_AMBULATORY_CARE_PROVIDER_SITE_OTHER): Payer: 59 | Admitting: Family Medicine

## 2014-12-17 VITALS — BP 123/87 | HR 88 | Temp 97.8°F | Resp 16 | Wt 229.0 lb

## 2014-12-17 DIAGNOSIS — J441 Chronic obstructive pulmonary disease with (acute) exacerbation: Secondary | ICD-10-CM

## 2014-12-17 DIAGNOSIS — J3089 Other allergic rhinitis: Secondary | ICD-10-CM | POA: Diagnosis not present

## 2014-12-17 DIAGNOSIS — J018 Other acute sinusitis: Secondary | ICD-10-CM | POA: Diagnosis not present

## 2014-12-17 MED ORDER — PREDNISONE 20 MG PO TABS: ORAL_TABLET | ORAL | Status: DC

## 2014-12-17 MED ORDER — CETIRIZINE HCL 10 MG PO CAPS: 10.0000 mg | ORAL_CAPSULE | Freq: Every day | ORAL | Status: DC

## 2014-12-17 MED ORDER — AMOXICILLIN 875 MG PO TABS: 875.0000 mg | ORAL_TABLET | Freq: Two times a day (BID) | ORAL | Status: AC

## 2014-12-17 NOTE — Progress Notes (Signed)
OFFICE VISIT  12/17/2014   CC:  Chief Complaint  Patient presents with  . Facial Swelling    swelling started 5 days ago, was swollen in the am except today.   HPI:    Patient is a 49 y.o. Caucasian female who presents for : 1) 1 week of feeling head/face congestion, stuffy nose, +HA.  No fever. Lots of coughing and wheezing throughout this time.  She has continued to smoke.  2) Lately has been waking up with swelling (last 4 days except today): describes generalized facial swelling, ? Of some more focal lip swelling on the first morning.  Took benadryl and went down by mid-day.  No itching.  No hives and no rash on face or any other part of her body.  After first morning it has only been some milder generalized facial swelling without rash--she says he has been walking a lot lately outside--?ragweed exposure.  No sneezing during walks. Was taking zyrtec daily up until about a week or two ago.  She has albuterol neb sol'n to use but can't afford albut HFA.   Past Medical History  Diagnosis Date  . Polycystic ovarian syndrome   . Colitis due to Clostridium difficile 2001  . Hepatic hemangioma   . Anxiety   . IBS (irritable bowel syndrome)   . History of Salmonella gastroenteritis   . GERD (gastroesophageal reflux disease)   . Rectal cancer     a. Followed by Dr. Gala Espinoza, dx 2000-2001. Tumor removed from rectal.  . Bipolar 1 disorder   . Migraine headache   . Asthma   . Arthritis   . Hypertension     a. Improved without intervention.  . Allergy   . Tobacco dependence   . Hyperlipidemia     a. Noted 02/2014.  . Morbid obesity   . Microscopic hematuria     Intermittent (no w/u done yet, as of 03/2014)  . Pruritic dermatitis 2015/2016    +Scabies prep at [facility] 07/2014. (Primarily pruritic skin, but eventually a subtle rash as well)  . Iron deficiency anemia 05/2014    per pt it is not due to vaginal blood loss; hemoccults sent to pt in mail 413/16.  . Vitamin D  deficiency     Past Surgical History  Procedure Laterality Date  . Cholecystectomy    . Colonoscopy  05/05/2007    adenoma  . Situ removed  age 77    High-grade rectal adenoma removed from rectum   . Dilation and curettage of uterus      for vaginal bleeding  . Cardiovascular stress test  03/24/14    Lexscan MIBI: mild anterior ischemia?  Cardiac CT angiogram recommended/done.  . Transthoracic echocardiogram  03/23/14    Normal  . Left heart catheterization with coronary angiogram N/A 03/31/2014    Normal coronaries, EF 15-17%, diastolic dysfunction.  Procedure: LEFT HEART CATHETERIZATION WITH CORONARY ANGIOGRAM;  Surgeon: Martha Grooms, MD;  Location: [facility] CATH LAB;  Service: Cardiovascular;  Laterality: N/A;  . Coronary ct angio  03/29/14    Two vessel dz/moderate stenosis of mid LAD and proximal RCA; cath recommended.   MEDS: not currently taking prednisone or amoxil listed below.   Outpatient Prescriptions Prior to Visit  Medication Sig Dispense Refill  . albuterol (PROVENTIL) (2.5 MG/3ML) 0.083% nebulizer solution Take 3 mLs (2.5 mg total) by nebulization every 6 (six) hours as needed for wheezing or shortness of breath. 75 mL 0  . albuterol (VENTOLIN HFA) 108 (  90 BASE) MCG/ACT inhaler Inhale 2 puffs into the lungs every 6 (six) hours as needed for wheezing or shortness of breath. 1 Inhaler 1  . aspirin 81 MG chewable tablet Chew 1 tablet (81 mg total) by mouth daily. 14 tablet 0  . busPIRone (BUSPAR) 15 MG tablet Take 30 mg by mouth at bedtime.     . clonazePAM (KLONOPIN) 0.5 MG tablet Takes 1 tablet during the day as needed and 2 tablets at bedtime. 90 tablet 5  . fluticasone (FLONASE) 50 MCG/ACT nasal spray Place 2 sprays into both nostrils daily. 16 g 1  . hydrOXYzine (ATARAX/VISTARIL) 25 MG tablet Take 25 mg by mouth 3 (three) times daily.     Marland Kitchen ibuprofen (ADVIL,MOTRIN) 800 MG tablet Take 1 tablet (800 mg total) by mouth 3 (three) times daily. 21 tablet 0  . lamoTRIgine  (LAMICTAL) 200 MG tablet Take 200 mg by mouth at bedtime.    . nitroGLYCERIN (NITROSTAT) 0.4 MG SL tablet Place 1 tablet (0.4 mg total) under the tongue every 5 (five) minutes as needed for chest pain. 20 tablet 0  . pantoprazole (PROTONIX) 40 MG tablet TAKE ONE TABLET BY MOUTH ONCE DAILY 30 tablet 6  . promethazine (PHENERGAN) 25 MG tablet Take 1 tablet (25 mg total) by mouth every 6 (six) hours as needed for nausea or vomiting. 10 tablet 0  . Sod Picosulfate-Mag Ox-Cit Acd 10-3.5-12 MG-GM-GM PACK Take 1 Container by mouth as directed. 1 each 0  . Cetirizine HCl (ZYRTEC ALLERGY) 10 MG CAPS Take 1 capsule (10 mg total) by mouth daily. 30 capsule 1  . ergocalciferol (VITAMIN D2) 50000 UNITS capsule Take 1 capsule (50,000 Units total) by mouth once a week. (Patient not taking: Reported on 12/17/2014) 12 capsule 0  . hydrocortisone cream 1 % Apply topically 3 (three) times daily. (Patient not taking: Reported on 12/17/2014) 30 g 0  . ondansetron (ZOFRAN) 4 MG tablet Take 1 tablet (4 mg total) by mouth every 8 (eight) hours as needed for nausea or vomiting. (Patient not taking: Reported on 12/17/2014) 10 tablet 0  . amoxicillin (AMOXIL) 500 MG capsule Take 2 capsules (1,000 mg total) by mouth 2 (two) times daily. (Patient not taking: Reported on 12/17/2014) 40 capsule 0  . oxyCODONE-acetaminophen (PERCOCET) 5-325 MG per tablet Take 1-2 tablets by mouth every 4 (four) hours as needed. (Patient not taking: Reported on 12/17/2014) 10 tablet 0  . predniSONE (DELTASONE) 50 MG tablet Take 1 tablet (50 mg total) by mouth daily. (Patient not taking: Reported on 12/17/2014) 5 tablet 0   No facility-administered medications prior to visit.    Allergies  Allergen Reactions  . Contrast Media [Iodinated Diagnostic Agents] Anaphylaxis  . Shellfish Allergy Anaphylaxis  . Codeine Nausea And Vomiting    Hallucinations, Also sees people that are not there   . Latex Hives  . Lipitor [Atorvastatin] Itching  . Permethrin  Itching    ROS As per HPI  PE: Blood pressure 123/87, pulse 88, temperature 97.8 F (36.6 C), temperature source Oral, resp. rate 16, weight 229 lb (103.874 kg). Gen: Alert, well appearing.  Patient is oriented to person, place, time, and situation. VS: noted--normal. Gen: alert, NAD, NONTOXIC APPEARING. FACE: no swelling of lips, eyes, or remainder of face.   HEENT: eyes without injection, drainage, or swelling.  Ears: EACs clear, TMs with normal light reflex and landmarks.  Nose: Clear rhinorrhea, with some dried, crusty exudate adherent to mildly injected mucosa.  No purulent d/c.  No paranasal  sinus TTP.  No facial swelling.  Throat and mouth without focal lesion.  No pharyngial swelling, erythema, or exudate.  Toungue w/out swelling.   Neck: supple, no LAD.   LUNGS: CTA bilat, nonlabored resps.   CV: RRR, no m/r/g. EXT: no c/c/e SKIN: no rash  LABS:  none  IMPRESSION AND PLAN:  Acute exac of COPD, smoker. Allergic rhinitis/? Sinusitis component as well. Doubt any angioedema from what she was describing about her face lately.  Treat with prednisone 40mg  qd x 5d, then 20mg  qd x 5d. Cont albut neb q4hh prn. Restart zyrtec 10mg  qd, may add 25mg  benadryl dose qd prn. Amoxil 875mg  bid x 10d. Encouraged smoking cessation. Pt declined flu vaccine today.  An After Visit Summary was printed and given to the patient.   FOLLOW UP: Return if symptoms worsen or fail to improve.

## 2015-01-22 ENCOUNTER — Encounter (HOSPITAL_COMMUNITY): Payer: Self-pay

## 2015-01-22 ENCOUNTER — Emergency Department (HOSPITAL_COMMUNITY)
Admission: EM | Admit: 2015-01-22 | Discharge: 2015-01-22 | Disposition: A | Discharge status: Home / Self Care | Payer: PRIVATE HEALTH INSURANCE | Attending: Emergency Medicine | Admitting: Emergency Medicine

## 2015-01-22 DIAGNOSIS — I10 Essential (primary) hypertension: Secondary | ICD-10-CM | POA: Insufficient documentation

## 2015-01-22 DIAGNOSIS — M1990 Unspecified osteoarthritis, unspecified site: Secondary | ICD-10-CM | POA: Diagnosis not present

## 2015-01-22 DIAGNOSIS — E559 Vitamin D deficiency, unspecified: Secondary | ICD-10-CM | POA: Diagnosis not present

## 2015-01-22 DIAGNOSIS — F419 Anxiety disorder, unspecified: Secondary | ICD-10-CM | POA: Insufficient documentation

## 2015-01-22 DIAGNOSIS — Y92410 Unspecified street and highway as the place of occurrence of the external cause: Secondary | ICD-10-CM | POA: Diagnosis not present

## 2015-01-22 DIAGNOSIS — J45909 Unspecified asthma, uncomplicated: Secondary | ICD-10-CM | POA: Insufficient documentation

## 2015-01-22 DIAGNOSIS — T07XXXA Unspecified multiple injuries, initial encounter: Secondary | ICD-10-CM

## 2015-01-22 DIAGNOSIS — Z7982 Long term (current) use of aspirin: Secondary | ICD-10-CM | POA: Diagnosis not present

## 2015-01-22 DIAGNOSIS — T148 Other injury of unspecified body region: Secondary | ICD-10-CM | POA: Diagnosis not present

## 2015-01-22 DIAGNOSIS — Z720 Tobacco use: Secondary | ICD-10-CM | POA: Diagnosis not present

## 2015-01-22 DIAGNOSIS — Z79899 Other long term (current) drug therapy: Secondary | ICD-10-CM | POA: Insufficient documentation

## 2015-01-22 DIAGNOSIS — K219 Gastro-esophageal reflux disease without esophagitis: Secondary | ICD-10-CM | POA: Insufficient documentation

## 2015-01-22 DIAGNOSIS — S46911A Strain of unspecified muscle, fascia and tendon at shoulder and upper arm level, right arm, initial encounter: Secondary | ICD-10-CM | POA: Insufficient documentation

## 2015-01-22 DIAGNOSIS — Z791 Long term (current) use of non-steroidal anti-inflammatories (NSAID): Secondary | ICD-10-CM | POA: Diagnosis not present

## 2015-01-22 DIAGNOSIS — Z8619 Personal history of other infectious and parasitic diseases: Secondary | ICD-10-CM | POA: Diagnosis not present

## 2015-01-22 DIAGNOSIS — Y998 Other external cause status: Secondary | ICD-10-CM | POA: Diagnosis not present

## 2015-01-22 DIAGNOSIS — Z85048 Personal history of other malignant neoplasm of rectum, rectosigmoid junction, and anus: Secondary | ICD-10-CM | POA: Diagnosis not present

## 2015-01-22 DIAGNOSIS — Z7951 Long term (current) use of inhaled steroids: Secondary | ICD-10-CM | POA: Diagnosis not present

## 2015-01-22 DIAGNOSIS — Z91040 Latex allergy status: Secondary | ICD-10-CM | POA: Insufficient documentation

## 2015-01-22 DIAGNOSIS — S3992XA Unspecified injury of lower back, initial encounter: Secondary | ICD-10-CM | POA: Diagnosis present

## 2015-01-22 DIAGNOSIS — Z862 Personal history of diseases of the blood and blood-forming organs and certain disorders involving the immune mechanism: Secondary | ICD-10-CM | POA: Insufficient documentation

## 2015-01-22 DIAGNOSIS — S39012A Strain of muscle, fascia and tendon of lower back, initial encounter: Secondary | ICD-10-CM | POA: Diagnosis not present

## 2015-01-22 DIAGNOSIS — Y9389 Activity, other specified: Secondary | ICD-10-CM | POA: Diagnosis not present

## 2015-01-22 DIAGNOSIS — G43909 Migraine, unspecified, not intractable, without status migrainosus: Secondary | ICD-10-CM | POA: Insufficient documentation

## 2015-01-22 DIAGNOSIS — F319 Bipolar disorder, unspecified: Secondary | ICD-10-CM | POA: Insufficient documentation

## 2015-01-22 MED ORDER — TRAMADOL HCL 50 MG PO TABS
100.0000 mg | ORAL_TABLET | Freq: Once | ORAL | Status: AC
  Administered 2015-01-22: 100 mg via ORAL
  Filled 2015-01-22: qty 2

## 2015-01-22 MED ORDER — METHOCARBAMOL 500 MG PO TABS: 500.0000 mg | ORAL_TABLET | Freq: Three times a day (TID) | ORAL | Status: DC

## 2015-01-22 MED ORDER — IBUPROFEN 800 MG PO TABS
800.0000 mg | ORAL_TABLET | Freq: Once | ORAL | Status: AC
  Administered 2015-01-22: 800 mg via ORAL
  Filled 2015-01-22: qty 1

## 2015-01-22 MED ORDER — ONDANSETRON HCL 4 MG PO TABS
4.0000 mg | ORAL_TABLET | Freq: Once | ORAL | Status: AC
  Administered 2015-01-22: 4 mg via ORAL
  Filled 2015-01-22: qty 1

## 2015-01-22 MED ORDER — MELOXICAM 15 MG PO TABS: 15.0000 mg | ORAL_TABLET | Freq: Every day | ORAL | Status: DC

## 2015-01-22 MED ORDER — TRAMADOL HCL 50 MG PO TABS
50.0000 mg | ORAL_TABLET | Freq: Four times a day (QID) | ORAL | Status: DC | PRN
Start: 2015-01-22 — End: 2015-03-02

## 2015-01-22 MED ORDER — DIAZEPAM 5 MG PO TABS
10.0000 mg | ORAL_TABLET | Freq: Once | ORAL | Status: AC
  Administered 2015-01-22: 10 mg via ORAL
  Filled 2015-01-22: qty 2

## 2015-01-22 NOTE — Discharge Instructions (Signed)
Heating pad to your back maybe helpful. Please use medications as suggested. Robaxin and Ultram may cause drowsiness, when taking these medicines please do not drink alcohol, drive a vehicle, operating machinery, or participated activity requiring concentration. Please see Dr. Ernestine Conrad for additional follow-up and management if not improving.

## 2015-01-22 NOTE — ED Notes (Signed)
Discharge instructions given, pt demonstrated teach back and verbal understanding. No concerns voiced.  
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
## 2015-03-02 ENCOUNTER — Ambulatory Visit (INDEPENDENT_AMBULATORY_CARE_PROVIDER_SITE_OTHER): Payer: 59 | Admitting: Family Medicine

## 2015-03-02 ENCOUNTER — Encounter: Payer: Self-pay | Admitting: Family Medicine

## 2015-03-02 VITALS — BP 131/90 | HR 90 | Temp 98.1°F | Resp 16 | Ht 60.0 in | Wt 229.8 lb

## 2015-03-02 DIAGNOSIS — M5442 Lumbago with sciatica, left side: Secondary | ICD-10-CM

## 2015-03-02 DIAGNOSIS — M5441 Lumbago with sciatica, right side: Secondary | ICD-10-CM

## 2015-03-02 DIAGNOSIS — J0191 Acute recurrent sinusitis, unspecified: Secondary | ICD-10-CM | POA: Diagnosis not present

## 2015-03-02 DIAGNOSIS — S39012A Strain of muscle, fascia and tendon of lower back, initial encounter: Secondary | ICD-10-CM | POA: Diagnosis not present

## 2015-03-02 MED ORDER — CYCLOBENZAPRINE HCL 10 MG PO TABS: 10.0000 mg | ORAL_TABLET | Freq: Three times a day (TID) | ORAL | Status: DC | PRN

## 2015-03-02 MED ORDER — AMOXICILLIN 875 MG PO TABS: 875.0000 mg | ORAL_TABLET | Freq: Two times a day (BID) | ORAL | Status: AC

## 2015-03-02 MED ORDER — ALBUTEROL SULFATE HFA 108 (90 BASE) MCG/ACT IN AERS: 2.0000 | INHALATION_SPRAY | Freq: Four times a day (QID) | RESPIRATORY_TRACT | Status: DC | PRN

## 2015-03-02 MED ORDER — HYDROCODONE-ACETAMINOPHEN 5-325 MG PO TABS: 1.0000 | ORAL_TABLET | Freq: Four times a day (QID) | ORAL | Status: DC | PRN

## 2015-03-02 NOTE — Progress Notes (Signed)
Pre visit review using our clinic review tool, if applicable. No additional management support is needed unless otherwise documented below in the visit note. 
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
## 2015-03-03 ENCOUNTER — Ambulatory Visit (HOSPITAL_COMMUNITY)
Admission: RE | Admit: 2015-03-03 | Discharge: 2015-03-03 | Disposition: A | Discharge status: Home / Self Care | Payer: 59 | Source: Ambulatory Visit | Attending: Family Medicine | Admitting: Family Medicine

## 2015-03-03 ENCOUNTER — Ambulatory Visit (HOSPITAL_COMMUNITY): Payer: 59 | Attending: Family Medicine | Admitting: Physical Therapy

## 2015-03-03 DIAGNOSIS — M25659 Stiffness of unspecified hip, not elsewhere classified: Secondary | ICD-10-CM | POA: Insufficient documentation

## 2015-03-03 DIAGNOSIS — R262 Difficulty in walking, not elsewhere classified: Secondary | ICD-10-CM | POA: Diagnosis present

## 2015-03-03 DIAGNOSIS — M5442 Lumbago with sciatica, left side: Secondary | ICD-10-CM

## 2015-03-03 DIAGNOSIS — R29898 Other symptoms and signs involving the musculoskeletal system: Secondary | ICD-10-CM | POA: Diagnosis present

## 2015-03-03 DIAGNOSIS — M545 Low back pain: Secondary | ICD-10-CM | POA: Diagnosis present

## 2015-03-03 DIAGNOSIS — M5441 Lumbago with sciatica, right side: Secondary | ICD-10-CM

## 2015-03-03 DIAGNOSIS — R6889 Other general symptoms and signs: Secondary | ICD-10-CM

## 2015-03-03 DIAGNOSIS — S39012A Strain of muscle, fascia and tendon of lower back, initial encounter: Secondary | ICD-10-CM

## 2015-03-03 DIAGNOSIS — G8929 Other chronic pain: Secondary | ICD-10-CM | POA: Diagnosis present
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
## 2015-03-07 ENCOUNTER — Ambulatory Visit (HOSPITAL_COMMUNITY): Payer: 59

## 2015-03-07 DIAGNOSIS — M25659 Stiffness of unspecified hip, not elsewhere classified: Secondary | ICD-10-CM

## 2015-03-07 DIAGNOSIS — R262 Difficulty in walking, not elsewhere classified: Secondary | ICD-10-CM

## 2015-03-07 DIAGNOSIS — R6889 Other general symptoms and signs: Secondary | ICD-10-CM

## 2015-03-07 DIAGNOSIS — R29898 Other symptoms and signs involving the musculoskeletal system: Secondary | ICD-10-CM

## 2015-03-07 DIAGNOSIS — M5442 Lumbago with sciatica, left side: Secondary | ICD-10-CM | POA: Diagnosis not present

## 2015-03-07 NOTE — Patient Instructions (Signed)
Isometric Abdominal    Lying on back with knees bent, tighten stomach by pressing elbows down. Hold 5 seconds. Repeat 10-20 times per set. Do 1-2 sets per session. Do 1-2 sessions per day.  http://orth.exer.us/1087   Copyright  VHI. All rights reserved.   Bridge    Lie back, legs bent. Inhale, pressing hips up. Keeping ribs in, lengthen lower back. Exhale, rolling down along spine from top. Repeat 10-20 times. Do 1-2 sessions per day.  http://pm.exer.us/55   Copyright  VHI. All rights reserved.   Piriformis Stretch, Supine    Lie supine, one ankle crossed onto opposite knee. Holding bottom leg behind knee, gently pull legs toward chest until stretch is felt in buttock of top leg. Hold 30 seconds. For deeper stretch gently push top knee away from body.  Repeat 3 times per session. Do 1-2 sessions per day.  Copyright  VHI. All rights reserved.   Piriformis Stretch, Sitting    Sit, one ankle on opposite knee, same-side hand on crossed knee. Push down on knee, keeping spine straight. Lean torso forward, with flat back, until tension is felt in hamstrings and gluteals of crossed-leg side. Hold 30 seconds.  Repeat 3  times per session. Do 1-2 sessions per day.  Copyright  VHI. All rights reserved.
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
## 2015-03-08 ENCOUNTER — Telehealth: Payer: Self-pay | Admitting: *Deleted

## 2015-03-08 ENCOUNTER — Telehealth (HOSPITAL_COMMUNITY): Payer: Self-pay | Admitting: Physical Therapy

## 2015-03-08 ENCOUNTER — Encounter (HOSPITAL_COMMUNITY): Payer: 59 | Admitting: Physical Therapy

## 2015-03-08 MED ORDER — PROMETHAZINE HCL 12.5 MG PO TABS: 12.5000 mg | ORAL_TABLET | Freq: Four times a day (QID) | ORAL | Status: DC | PRN

## 2015-03-08 NOTE — Telephone Encounter (Signed)
She is sick and can not come in today,

## 2015-03-08 NOTE — Telephone Encounter (Signed)
Pt advised and voiced understanding.   

## 2015-03-08 NOTE — Telephone Encounter (Signed)
Phenergan eRx'd as per pt's request.

## 2015-03-08 NOTE — Telephone Encounter (Signed)
Pt called and stated that she has been having n/v and diarrhea since yesterday (03/07/15) and would like a Rx for phenergan. She stated that she is too sick to come in for ov. Please advise. Thanks.

## 2015-03-15 ENCOUNTER — Ambulatory Visit (HOSPITAL_COMMUNITY): Payer: 59 | Admitting: Physical Therapy

## 2015-03-15 DIAGNOSIS — R6889 Other general symptoms and signs: Secondary | ICD-10-CM

## 2015-03-15 DIAGNOSIS — M5442 Lumbago with sciatica, left side: Secondary | ICD-10-CM | POA: Diagnosis not present

## 2015-03-15 DIAGNOSIS — R29898 Other symptoms and signs involving the musculoskeletal system: Secondary | ICD-10-CM

## 2015-03-15 DIAGNOSIS — M25659 Stiffness of unspecified hip, not elsewhere classified: Secondary | ICD-10-CM

## 2015-03-15 DIAGNOSIS — R262 Difficulty in walking, not elsewhere classified: Secondary | ICD-10-CM

## 2015-03-15 NOTE — Therapy (Signed)
[place], [place], [address] Phone: [phone]   Fax:  [phone]  Physical Therapy Treatment  Patient Details  Name: Martha Espinoza MRN: [id number] Date of Birth: 01-Dec-1965 Referring Provider: Julien Nordmann  Encounter Date: 03/15/2015      PT End of Session - 03/15/15 1608    Visit Number 3   Number of Visits 12   Authorization Type [employer]   Authorization Time Period 03/03/15-05/04/15   PT Start Time 1435   PT Stop Time 1520   PT Time Calculation (min) 45 min   Activity Tolerance Patient limited by pain;Patient tolerated treatment well   Behavior During Therapy [facility] for tasks assessed/performed      Past Medical History  Diagnosis Date  . Polycystic ovarian syndrome   . Colitis due to Clostridium difficile 2001  . Hepatic hemangioma   . Anxiety   . IBS (irritable bowel syndrome)   . History of Salmonella gastroenteritis   . GERD (gastroesophageal reflux disease)   . Rectal cancer ([place])     a. Followed by Dr. Gala Romney, dx 2000-2001. Tumor removed from rectal.  . Bipolar 1 disorder ([place])   . Migraine headache   . Asthma   . Arthritis   . Hypertension     a. Improved without intervention.  . Allergy   . Tobacco dependence   . Hyperlipidemia     a. Noted 02/2014.  . Morbid obesity ([place])   . Microscopic hematuria     Intermittent (no w/u done yet, as of 03/2014)  . Pruritic dermatitis 2015/2016    +Scabies prep at [facility] 07/2014. (Primarily pruritic skin, but eventually a subtle rash as well)  . Iron deficiency anemia 05/2014    per pt it is not due to vaginal blood loss; hemoccults sent to pt in mail 413/16.  . Vitamin D deficiency     Past Surgical History  Procedure Laterality Date  . Cholecystectomy    . Colonoscopy  05/05/2007    adenoma  . Situ removed  age 49    High-grade rectal adenoma removed from rectum   . Dilation and curettage of uterus      for vaginal bleeding  .  Cardiovascular stress test  03/24/14    Lexscan MIBI: mild anterior ischemia?  Cardiac CT angiogram recommended/done.  . Transthoracic echocardiogram  03/23/14    Normal  . Left heart catheterization with coronary angiogram N/A 03/31/2014    Normal coronaries, EF 0000000, diastolic dysfunction.  Procedure: LEFT HEART CATHETERIZATION WITH CORONARY ANGIOGRAM;  Surgeon: Sinclair Grooms, MD;  Location: [facility] CATH LAB;  Service: Cardiovascular;  Laterality: N/A;  . Coronary ct angio  03/29/14    Two vessel dz/moderate stenosis of mid LAD and proximal RCA; cath recommended.    There were no vitals filed for this visit.  Visit Diagnosis:  Bilateral low back pain with left-sided sciatica  Weakness of both legs  Stiffness of hip joint, unspecified laterality  Decreased functional activity tolerance  Difficulty walking      Subjective Assessment - 03/15/15 1550    Subjective PT states she contracted the norovirus after last session and was sick for 3 days.  states her pain is 5/10 today lumbar region equally and down into Lt LE but her leg hurts worse than her back.  States she's been trying to decrease the use of her pain pills.  States she has not taken one today.  [facility] Adult PT Treatment/Exercise - 03/15/15 0001    Lumbar Exercises: Stretches   Active Hamstring Stretch 2 reps;20 seconds   Active Hamstring Stretch Limitations supine with rope   Piriformis Stretch 5 reps;10 seconds   Piriformis Stretch Limitations supine figure 4   Lumbar Exercises: Standing   Other Standing Lumbar Exercises SLS max 13" LT, 11" RT, tandem stance Lt lead 36", Rt lead 32"   Lumbar Exercises: Supine   Ab Set 15 reps   Bridge 5 seconds;15 reps   Straight Leg Raise 10 reps   Lumbar Exercises: Sidelying   Hip Abduction 10 reps   Lumbar Exercises: Prone   Straight Leg Raise 10 reps   Manual Therapy   Manual Therapy Soft tissue mobilization   Manual therapy comments  performed beginning of session in prone position with bolster under ankle   Soft tissue mobilization Prone paraspinals, rhomboids (spasm Lt), Bil Quadratus lumborum, compression gluteal                  PT Short Term Goals - 03/03/15 1512    PT SHORT TERM GOAL #1   Title Pt will be independent with HEP.   Time 3   Period Weeks   Status New   PT SHORT TERM GOAL #2   Title Improve lumbar ROM by 10 degrees or more in all planes to improve mobiity and to allow pt to complete self care with decreased pain.   Time 3   Period Weeks   Status New   PT SHORT TERM GOAL #3   Title Improve ROM of L hip IR and ER by 10 degrees or greater to improve ability to don/doff shoes and to improve gait mechanics.   Time 3   Period Weeks   Status New   PT SHORT TERM GOAL #4   Title Improve BLE strength to 4-/5 or greater to improve gait mechanics and ability to climb stairs with decreased pain.   Time 3   Period Weeks   Status New           PT Long Term Goals - 03/03/15 1514    PT LONG TERM GOAL #1   Title Restore full and painfree lumbar AROM to allow for pt to complete housework without increased LBP.   Time 6   Period Weeks   Status New   PT LONG TERM GOAL #2   Title Increase bilateral hip IR/ER ROM to 35 degrees or more to improve mobility and gait mechanics.    Time 6   Period Weeks   Status New   PT LONG TERM GOAL #3   Title Improve BLE strength evidenced by ability to complete five time sit to stand in 18 seconds or less without onset of LBP.   Time 6   Period Weeks   Status New   PT LONG TERM GOAL #4   Title Pt will ambulate 1,000 feet with equal weightbearing, proper heel strike, equal step length, and <2/10 pain to return pt to community ambulation.   Time 6   Status New   PT LONG TERM GOAL #5   Title Pt will navigate 12 steps with reciprocal pattern and <2/10 pain.    Time 6   Period Weeks   Status New               Plan - 03/15/15 1609    Clinical  Impression Statement SI checked and in correct alignment today.  PT hypersensitive to touch in this  area as well as thoracic region.  Cotninued with therex to increase core stability and strength.  Added prone and side lying SLR wtih noted weakness.  Pt able to complete all exercises without c/o pain, however some discomfort with supine stretches.     PT Next Visit Plan Continue with  manual therapy to decrease muscle guarding and pain and continue core stabilization program.          Problem List Patient Active Problem List   Diagnosis Date Noted  . Abdominal pain 07/26/2014  . Nausea without vomiting 07/26/2014  . History of colon cancer 07/26/2014  . Iron deficiency anemia 07/07/2014  . Vitamin D deficiency 07/07/2014  . Abnormal nuclear stress test 03/31/2014  . Abnormal cardiac CT angiography 03/31/2014  . Hyperlipidemia 03/24/2014  . Morbid obesity ([place])   . Chest pain of uncertain etiology 123456  . COPD (chronic obstructive pulmonary disease) ([place]) 03/23/2014  . Leukocytosis 03/23/2014  . Hyperglycemia 03/23/2014  . Bipolar 1 disorder ([place]) 03/23/2014  . Pruritic rash 03/23/2014  . Chronic anxiety 03/23/2014  . Tobacco abuse 03/23/2014  . Intractable migraine without aura and with status migrainosus 01/14/2014  . Sinusitis 07/02/2013  . Urinary frequency 07/02/2013  . Migraine 07/02/2013  . Anxiety state, unspecified 04/14/2013  . Obesity 04/14/2013  . Anemia 04/14/2013  . CONSTIPATION 10/12/2009  . CARCINOMA IN SITU OF RECTUM 08/25/2009  . GERD 08/25/2009  . IRRITABLE BOWEL SYNDROME 08/25/2009  . DIARRHEA, CHRONIC 08/25/2009  . HEMANGIOMA, HEPATIC 08/24/2009  . POLYCYSTIC OVARIAN DISEASE 08/24/2009  . Personal history of other diseases of digestive system 08/24/2009    Teena Irani, PTA/CLT [phone]  03/15/2015, 5:09 PM  [facility] [address] [place], [place], [address] Phone: [phone]   Fax:   [phone]  Name: Martha Espinoza MRN: [id number] Date of Birth: 26-Mar-1966

## 2015-03-16 ENCOUNTER — Ambulatory Visit (HOSPITAL_COMMUNITY): Payer: 59 | Admitting: Physical Therapy

## 2015-03-22 ENCOUNTER — Ambulatory Visit (HOSPITAL_COMMUNITY): Payer: 59 | Admitting: Physical Therapy

## 2015-03-22 DIAGNOSIS — R262 Difficulty in walking, not elsewhere classified: Secondary | ICD-10-CM

## 2015-03-22 DIAGNOSIS — M5442 Lumbago with sciatica, left side: Secondary | ICD-10-CM | POA: Diagnosis not present

## 2015-03-22 DIAGNOSIS — R29898 Other symptoms and signs involving the musculoskeletal system: Secondary | ICD-10-CM

## 2015-03-22 DIAGNOSIS — M25659 Stiffness of unspecified hip, not elsewhere classified: Secondary | ICD-10-CM

## 2015-03-22 DIAGNOSIS — R6889 Other general symptoms and signs: Secondary | ICD-10-CM
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
## 2015-03-23 ENCOUNTER — Telehealth: Payer: Self-pay | Admitting: *Deleted

## 2015-03-23 ENCOUNTER — Ambulatory Visit (HOSPITAL_COMMUNITY): Payer: 59 | Admitting: Physical Therapy

## 2015-03-23 DIAGNOSIS — M5442 Lumbago with sciatica, left side: Secondary | ICD-10-CM

## 2015-03-23 DIAGNOSIS — R6889 Other general symptoms and signs: Secondary | ICD-10-CM

## 2015-03-23 DIAGNOSIS — M5416 Radiculopathy, lumbar region: Secondary | ICD-10-CM

## 2015-03-23 DIAGNOSIS — R262 Difficulty in walking, not elsewhere classified: Secondary | ICD-10-CM

## 2015-03-23 DIAGNOSIS — R29898 Other symptoms and signs involving the musculoskeletal system: Secondary | ICD-10-CM

## 2015-03-23 DIAGNOSIS — M5441 Lumbago with sciatica, right side: Secondary | ICD-10-CM

## 2015-03-23 DIAGNOSIS — M25659 Stiffness of unspecified hip, not elsewhere classified: Secondary | ICD-10-CM
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
## 2015-03-23 NOTE — Telephone Encounter (Signed)
Left message for pt to call back at 3:54pm. hlk   Pt LMOM on 03/23/15 at 1:42pm. She stated that she has been going to PT and has not improved. She stated that they are recommending MRI. She requested a call back.

## 2015-03-24 NOTE — Telephone Encounter (Signed)
OK, ordered MRI of lumbar spine to be done at [facility].

## 2015-03-24 NOTE — Telephone Encounter (Signed)
Spoke to pt and she stated that every time they do PT on her back she has pain that goes down her left leg. She stated that due to this they are not sure what to do and recommended pt to get MRI. Please advise. Thanks.   Pt LMOM on 03/23/15 at 4:21pm returning my call.

## 2015-03-25 NOTE — Telephone Encounter (Signed)
Pt advised and voiced understanding.   

## 2015-03-27 ENCOUNTER — Encounter (HOSPITAL_COMMUNITY): Payer: Self-pay | Admitting: Emergency Medicine

## 2015-03-27 ENCOUNTER — Emergency Department (HOSPITAL_COMMUNITY)
Admission: EM | Admit: 2015-03-27 | Discharge: 2015-03-27 | Disposition: A | Discharge status: Home / Self Care | Payer: 59 | Attending: Emergency Medicine | Admitting: Emergency Medicine

## 2015-03-27 DIAGNOSIS — Z91040 Latex allergy status: Secondary | ICD-10-CM | POA: Diagnosis not present

## 2015-03-27 DIAGNOSIS — K589 Irritable bowel syndrome without diarrhea: Secondary | ICD-10-CM | POA: Insufficient documentation

## 2015-03-27 DIAGNOSIS — J45909 Unspecified asthma, uncomplicated: Secondary | ICD-10-CM | POA: Diagnosis not present

## 2015-03-27 DIAGNOSIS — Z8619 Personal history of other infectious and parasitic diseases: Secondary | ICD-10-CM | POA: Diagnosis not present

## 2015-03-27 DIAGNOSIS — Z85048 Personal history of other malignant neoplasm of rectum, rectosigmoid junction, and anus: Secondary | ICD-10-CM | POA: Diagnosis not present

## 2015-03-27 DIAGNOSIS — Z862 Personal history of diseases of the blood and blood-forming organs and certain disorders involving the immune mechanism: Secondary | ICD-10-CM | POA: Insufficient documentation

## 2015-03-27 DIAGNOSIS — Z9889 Other specified postprocedural states: Secondary | ICD-10-CM | POA: Insufficient documentation

## 2015-03-27 DIAGNOSIS — R42 Dizziness and giddiness: Secondary | ICD-10-CM | POA: Insufficient documentation

## 2015-03-27 DIAGNOSIS — F419 Anxiety disorder, unspecified: Secondary | ICD-10-CM | POA: Insufficient documentation

## 2015-03-27 DIAGNOSIS — I10 Essential (primary) hypertension: Secondary | ICD-10-CM | POA: Diagnosis not present

## 2015-03-27 DIAGNOSIS — Z791 Long term (current) use of non-steroidal anti-inflammatories (NSAID): Secondary | ICD-10-CM | POA: Diagnosis not present

## 2015-03-27 DIAGNOSIS — M1990 Unspecified osteoarthritis, unspecified site: Secondary | ICD-10-CM | POA: Insufficient documentation

## 2015-03-27 DIAGNOSIS — J0101 Acute recurrent maxillary sinusitis: Secondary | ICD-10-CM | POA: Insufficient documentation

## 2015-03-27 DIAGNOSIS — G43909 Migraine, unspecified, not intractable, without status migrainosus: Secondary | ICD-10-CM | POA: Diagnosis not present

## 2015-03-27 DIAGNOSIS — Z79899 Other long term (current) drug therapy: Secondary | ICD-10-CM | POA: Diagnosis not present

## 2015-03-27 DIAGNOSIS — Z7982 Long term (current) use of aspirin: Secondary | ICD-10-CM | POA: Insufficient documentation

## 2015-03-27 DIAGNOSIS — Z872 Personal history of diseases of the skin and subcutaneous tissue: Secondary | ICD-10-CM | POA: Diagnosis not present

## 2015-03-27 DIAGNOSIS — H66002 Acute suppurative otitis media without spontaneous rupture of ear drum, left ear: Secondary | ICD-10-CM

## 2015-03-27 DIAGNOSIS — F17210 Nicotine dependence, cigarettes, uncomplicated: Secondary | ICD-10-CM | POA: Insufficient documentation

## 2015-03-27 DIAGNOSIS — Z7951 Long term (current) use of inhaled steroids: Secondary | ICD-10-CM | POA: Insufficient documentation

## 2015-03-27 DIAGNOSIS — M25511 Pain in right shoulder: Secondary | ICD-10-CM

## 2015-03-27 DIAGNOSIS — F319 Bipolar disorder, unspecified: Secondary | ICD-10-CM | POA: Diagnosis not present

## 2015-03-27 DIAGNOSIS — K219 Gastro-esophageal reflux disease without esophagitis: Secondary | ICD-10-CM | POA: Diagnosis not present

## 2015-03-27 DIAGNOSIS — R0981 Nasal congestion: Secondary | ICD-10-CM | POA: Diagnosis present

## 2015-03-27 HISTORY — DX: Pain in right shoulder: M25.511

## 2015-03-27 MED ORDER — AMOXICILLIN-POT CLAVULANATE 875-125 MG PO TABS
1.0000 | ORAL_TABLET | Freq: Two times a day (BID) | ORAL | Status: DC
Start: 2015-03-27 — End: 2015-05-04

## 2015-03-27 MED ORDER — PREDNISONE 10 MG PO TABS: ORAL_TABLET | ORAL | Status: DC

## 2015-03-27 MED ORDER — HYDROCODONE-ACETAMINOPHEN 5-325 MG PO TABS: 2.0000 | ORAL_TABLET | ORAL | Status: DC | PRN

## 2015-03-27 MED ORDER — HYDROCODONE-ACETAMINOPHEN 5-325 MG PO TABS
2.0000 | ORAL_TABLET | Freq: Once | ORAL | Status: AC
  Administered 2015-03-27: 2 via ORAL
  Filled 2015-03-27 (×2): qty 2

## 2015-03-27 NOTE — Discharge Instructions (Signed)
Otitis Media, Adult  Otitis media is redness, soreness, and inflammation of the middle ear. Otitis media may be caused by allergies or, most commonly, by infection. Often it occurs as a complication of the common cold.  SIGNS AND SYMPTOMS  Symptoms of otitis media may include:   Earache.   Fever.   Ringing in your ear.   Headache.   Leakage of fluid from the ear.  DIAGNOSIS  To diagnose otitis media, your health care provider will examine your ear with an otoscope. This is an instrument that allows your health care provider to see into your ear in order to examine your eardrum. Your health care provider also will ask you questions about your symptoms.  TREATMENT   Typically, otitis media resolves on its own within 3-5 days. Your health care provider may prescribe medicine to ease your symptoms of pain. If otitis media does not resolve within 5 days or is recurrent, your health care provider may prescribe antibiotic medicines if he or she suspects that a bacterial infection is the cause.  HOME CARE INSTRUCTIONS    If you were prescribed an antibiotic medicine, finish it all even if you start to feel better.   Take medicines only as directed by your health care provider.   Keep all follow-up visits as directed by your health care provider.  SEEK MEDICAL CARE IF:   You have otitis media only in one ear, or bleeding from your nose, or both.   You notice a lump on your neck.   You are not getting better in 3-5 days.   You feel worse instead of better.  SEEK IMMEDIATE MEDICAL CARE IF:    You have pain that is not controlled with medicine.   You have swelling, redness, or pain around your ear or stiffness in your neck.   You notice that part of your face is paralyzed.   You notice that the bone behind your ear (mastoid) is tender when you touch it.  MAKE SURE YOU:    Understand these instructions.   Will watch your condition.   Will get help right away if you are not doing well or get worse.     This  information is not intended to replace advice given to you by your health care provider. Make sure you discuss any questions you have with your health care provider.     Document Released: 12/16/2003 Document Revised: 04/02/2014 Document Reviewed: 10/07/2012  Elsevier Interactive Patient Education 2016 [employer]  Sinusitis, Adult  Sinusitis is redness, soreness, and inflammation of the paranasal sinuses. Paranasal sinuses are air pockets within the bones of your face. They are located beneath your eyes, in the middle of your forehead, and above your eyes. In healthy paranasal sinuses, mucus is able to drain out, and air is able to circulate through them by way of your nose. However, when your paranasal sinuses are inflamed, mucus and air can become trapped. This can allow bacteria and other germs to grow and cause infection.  Sinusitis can develop quickly and last only a short time (acute) or continue over a long period (chronic). Sinusitis that lasts for more than 12 weeks is considered chronic.  CAUSES  Causes of sinusitis include:   Allergies.   Structural abnormalities, such as displacement of the cartilage that separates your nostrils (deviated septum), which can decrease the air flow through your nose and sinuses and affect sinus drainage.   Functional abnormalities, such as when the small hairs (cilia) that   line your sinuses and help remove mucus do not work properly or are not present.  SIGNS AND SYMPTOMS  Symptoms of acute and chronic sinusitis are the same. The primary symptoms are pain and pressure around the affected sinuses. Other symptoms include:   Upper toothache.   Earache.   Headache.   Bad breath.   Decreased sense of smell and taste.   A cough, which worsens when you are lying flat.   Fatigue.   Fever.   Thick drainage from your nose, which often is green and may contain pus (purulent).   Swelling and warmth over the affected sinuses.  DIAGNOSIS  Your health care provider will  perform a physical exam. During your exam, your health care provider may perform any of the following to help determine if you have acute sinusitis or chronic sinusitis:   Look in your nose for signs of abnormal growths in your nostrils (nasal polyps).   Tap over the affected sinus to check for signs of infection.   View the inside of your sinuses using an imaging device that has a light attached (endoscope).  If your health care provider suspects that you have chronic sinusitis, one or more of the following tests may be recommended:   Allergy tests.   Nasal culture. A sample of mucus is taken from your nose, sent to a lab, and screened for bacteria.   Nasal cytology. A sample of mucus is taken from your nose and examined by your health care provider to determine if your sinusitis is related to an allergy.  TREATMENT  Most cases of acute sinusitis are related to a viral infection and will resolve on their own within 10 days. Sometimes, medicines are prescribed to help relieve symptoms of both acute and chronic sinusitis. These may include pain medicines, decongestants, nasal steroid sprays, or saline sprays.  However, for sinusitis related to a bacterial infection, your health care provider will prescribe antibiotic medicines. These are medicines that will help kill the bacteria causing the infection.  Rarely, sinusitis is caused by a fungal infection. In these cases, your health care provider will prescribe antifungal medicine.  For some cases of chronic sinusitis, surgery is needed. Generally, these are cases in which sinusitis recurs more than 3 times per year, despite other treatments.  HOME CARE INSTRUCTIONS   Drink plenty of water. Water helps thin the mucus so your sinuses can drain more easily.   Use a humidifier.   Inhale steam 3-4 times a day (for example, sit in the bathroom with the shower running).   Apply a warm, moist washcloth to your face 3-4 times a day, or as directed by your health care  provider.   Use saline nasal sprays to help moisten and clean your sinuses.   Take medicines only as directed by your health care provider.   If you were prescribed either an antibiotic or antifungal medicine, finish it all even if you start to feel better.  SEEK IMMEDIATE MEDICAL CARE IF:   You have increasing pain or severe headaches.   You have nausea, vomiting, or drowsiness.   You have swelling around your face.   You have vision problems.   You have a stiff neck.   You have difficulty breathing.     This information is not intended to replace advice given to you by your health care provider. Make sure you discuss any questions you have with your health care provider.     Document Released: 03/12/2005 Document Revised:

## 2015-03-27 NOTE — ED Provider Notes (Signed)
CSN: [id number]     Arrival date & time 03/27/15  1248 History  By signing my name below, I, Rayna Sexton, attest that this documentation has been prepared under the direction and in the presence of Caryl Ada, [place]. Electronically Signed: Rayna Sexton, ED Scribe. 03/27/2015. 2:18 PM.   Chief Complaint  Patient presents with  . Nasal Congestion   The history is provided by the patient. No language interpreter was used.    HPI Comments: Martha Espinoza is a 50 y.o. female who presents to the Emergency Department complaining of constant, moderate, sinus and chest congestion with onset 1 week ago. She notes associated, intermittent, dizziness with onset last night, left ear pain and HA. She notes taking Alka-Seltzer which she says provides short term relief.Pt notes a hx of sinus infections further noting typically having 4x per year which are treated with Amoxacillin. Pt notes a hx of smoking 1/2 PPD. She denies a hx of DM. Pt denies any other associated symptoms at this time.    Past Medical History  Diagnosis Date  . Polycystic ovarian syndrome   . Colitis due to Clostridium difficile 2001  . Hepatic hemangioma   . Anxiety   . IBS (irritable bowel syndrome)   . History of Salmonella gastroenteritis   . GERD (gastroesophageal reflux disease)   . Rectal cancer ([place])     a. Followed by Dr. Gala Romney, dx 2000-2001. Tumor removed from rectal.  . Bipolar 1 disorder ([place])   . Migraine headache   . Asthma   . Arthritis   . Hypertension     a. Improved without intervention.  . Allergy   . Tobacco dependence   . Hyperlipidemia     a. Noted 02/2014.  . Morbid obesity ([place])   . Microscopic hematuria     Intermittent (no w/u done yet, as of 03/2014)  . Pruritic dermatitis 2015/2016    +Scabies prep at [facility] 07/2014. (Primarily pruritic skin, but eventually a subtle rash as well)  . Iron deficiency anemia 05/2014    per pt it is not due to vaginal blood loss; hemoccults sent  to pt in mail 413/16.  . Vitamin D deficiency    Past Surgical History  Procedure Laterality Date  . Cholecystectomy    . Colonoscopy  05/05/2007    adenoma  . Situ removed  age 41    High-grade rectal adenoma removed from rectum   . Dilation and curettage of uterus      for vaginal bleeding  . Cardiovascular stress test  03/24/14    Lexscan MIBI: mild anterior ischemia?  Cardiac CT angiogram recommended/done.  . Transthoracic echocardiogram  03/23/14    Normal  . Left heart catheterization with coronary angiogram N/A 03/31/2014    Normal coronaries, EF 0000000, diastolic dysfunction.  Procedure: LEFT HEART CATHETERIZATION WITH CORONARY ANGIOGRAM;  Surgeon: Sinclair Grooms, MD;  Location: [facility] CATH LAB;  Service: Cardiovascular;  Laterality: N/A;  . Coronary ct angio  03/29/14    Two vessel dz/moderate stenosis of mid LAD and proximal RCA; cath recommended.   Family History  Problem Relation Age of Onset  . Colon cancer Father 62  . Cancer Father   . Diverticulitis Mother   . Heart disease Mother   . Hypertension Mother   . Hyperlipidemia Mother   . Mental illness Mother   . Diabetes Mother   . Heart attack Mother   . Stroke Neg Hx    Social History  Substance  Use Topics  . Smoking status: Current Every Day Smoker -- 0.50 packs/day for 18 years    Types: Cigarettes  . Smokeless tobacco: Never Used     Comment: 25 years  . Alcohol Use: No   OB History    Gravida Para Term Preterm AB TAB SAB Ectopic Multiple Living   4 1 1  3  3   1      Review of Systems  HENT: Positive for congestion, ear pain, postnasal drip and voice change. Negative for ear discharge.   Neurological: Positive for dizziness and headaches.  All other systems reviewed and are negative.   Allergies  Contrast media; Shellfish allergy; Codeine; Latex; Lipitor; and Permethrin  Home Medications   Prior to Admission medications   Medication Sig Start Date End Date Taking? Authorizing Provider  albuterol  (PROVENTIL) (2.5 MG/3ML) 0.083% nebulizer solution Take 3 mLs (2.5 mg total) by nebulization every 6 (six) hours as needed for wheezing or shortness of breath. 10/22/14   Tammy Triplett, PA-C  albuterol (VENTOLIN HFA) 108 (90 BASE) MCG/ACT inhaler Inhale 2 puffs into the lungs every 6 (six) hours as needed for wheezing or shortness of breath. 03/02/15   Tammi Sou, MD  aspirin 81 MG chewable tablet Chew 1 tablet (81 mg total) by mouth daily. 09/06/12   Ripley Fraise, MD  busPIRone (BUSPAR) 15 MG tablet Take 30 mg by mouth at bedtime.     Historical Provider, MD  Cetirizine HCl (ZYRTEC ALLERGY) 10 MG CAPS Take 1 capsule (10 mg total) by mouth daily. 12/17/14   Tammi Sou, MD  clonazePAM (KLONOPIN) 0.5 MG tablet Takes 1 tablet during the day as needed and 2 tablets at bedtime. 10/04/14   Tammi Sou, MD  cyclobenzaprine (FLEXERIL) 10 MG tablet Take 1 tablet (10 mg total) by mouth 3 (three) times daily as needed for muscle spasms. 03/02/15   Tammi Sou, MD  fluticasone (FLONASE) 50 MCG/ACT nasal spray Place 2 sprays into both nostrils daily. 08/24/14   Kristen N Ward, DO  HYDROcodone-acetaminophen (NORCO/VICODIN) 5-325 MG tablet Take 1-2 tablets by mouth every 6 (six) hours as needed for moderate pain. 03/02/15   Tammi Sou, MD  hydrOXYzine (ATARAX/VISTARIL) 25 MG tablet Take 25 mg by mouth 3 (three) times daily.     Historical Provider, MD  ibuprofen (ADVIL,MOTRIN) 800 MG tablet Take 1 tablet (800 mg total) by mouth 3 (three) times daily. 06/28/14   Lily Kocher, PA-C  lamoTRIgine (LAMICTAL) 200 MG tablet Take 200 mg by mouth at bedtime.    Historical Provider, MD  nitroGLYCERIN (NITROSTAT) 0.4 MG SL tablet Place 1 tablet (0.4 mg total) under the tongue every 5 (five) minutes as needed for chest pain. 03/25/14   Eugenie Filler, MD  pantoprazole (PROTONIX) 40 MG tablet TAKE ONE TABLET BY MOUTH ONCE DAILY 10/04/14   Tammi Sou, MD  promethazine (PHENERGAN) 12.5 MG tablet Take  1 tablet (12.5 mg total) by mouth every 6 (six) hours as needed for nausea or vomiting. 1-2 tabs po q6h prn nausea 03/08/15   Tammi Sou, MD  Sod Picosulfate-Mag Ox-Cit Acd 10-3.5-12 MG-GM-GM PACK Take 1 Container by mouth as directed. 09/15/14   Daneil Dolin, MD   BP 149/81 mmHg  Pulse 87  Temp(Src) 97.8 F (36.6 C) (Oral)  Resp 18  Ht 5' (1.524 m)  Wt 226 lb (102.513 kg)  BMI 44.14 kg/m2  SpO2 94%  LMP 03/22/2015    Physical Exam  Constitutional: She is oriented to person, place, and time. She appears well-developed and well-nourished.  HENT:  Head: Normocephalic and atraumatic.  Right Ear: External ear normal.  Left Ear: Tympanic membrane is erythematous and bulging.  Mouth/Throat: No oropharyngeal exudate.  Left TM painful to exam  Neck: Normal range of motion. No tracheal deviation present.  Cardiovascular: Normal rate.   Pulmonary/Chest: Effort normal and breath sounds normal. No respiratory distress. She has no wheezes. She has no rales.  Abdominal: Soft. There is no tenderness.  Musculoskeletal: Normal range of motion.  Neurological: She is alert and oriented to person, place, and time.  Skin: Skin is warm and dry. She is not diaphoretic.  Psychiatric: She has a normal mood and affect. Her behavior is normal.  Nursing note and vitals reviewed.   ED Course  Procedures  DIAGNOSTIC STUDIES: Oxygen Saturation is 94% on RA, adequate by my interpretation.    COORDINATION OF CARE: 2:09 PM Pt presents today with multiple cold like symptoms. Discussed treatment plan and return precautions with pt and she agreed to the plan.   Labs Review Labs Reviewed - No data to display  Imaging Review No results found.   EKG Interpretation None      MDM   Final diagnoses:  Acute suppurative otitis media of left ear without spontaneous rupture of tympanic membrane, recurrence not specified  Recurrent maxillary sinusitis, unspecified chronicity    Meds ordered this  encounter  Medications  . amoxicillin-clavulanate (AUGMENTIN) 875-125 MG tablet    Sig: Take 1 tablet by mouth every 12 (twelve) hours.    Dispense:  20 tablet    Refill:  0    Order Specific Question:  Supervising Provider    Answer:  MILLER, BRIAN [[id number]]  . HYDROcodone-acetaminophen (NORCO/VICODIN) 5-325 MG tablet    Sig: Take 2 tablets by mouth every 4 (four) hours as needed.    Dispense:  20 tablet    Refill:  0    Order Specific Question:  Supervising Provider    Answer:  MILLER, BRIAN [[id number]]  . predniSONE (DELTASONE) 10 MG tablet    Sig: 6,5,4,3,2,1 taper    Dispense:  21 tablet    Refill:  0    Order Specific Question:  Supervising Provider    Answer:  MILLER, BRIAN [[id number]]  . HYDROcodone-acetaminophen (NORCO/VICODIN) 5-325 MG per tablet 2 tablet    Sig:    An After Visit Summary was printed and given to the patient.  I personally performed the services in this documentation, which was scribed in my presence.  The recorded information has been reviewed and considered.   Ronnald Collum.  Hollace Kinnier [place], PA-C 03/27/15 [place], MD 03/28/15 [phone]

## 2015-03-27 NOTE — ED Notes (Signed)
Pt reports sinus congestion, cough, and LT ear pain x 1 week.

## 2015-03-29 ENCOUNTER — Ambulatory Visit (HOSPITAL_COMMUNITY): Payer: 59

## 2015-03-31 ENCOUNTER — Encounter (HOSPITAL_COMMUNITY): Payer: 59 | Admitting: Physical Therapy

## 2015-04-04 ENCOUNTER — Ambulatory Visit (HOSPITAL_COMMUNITY): Payer: 59

## 2015-04-05 ENCOUNTER — Ambulatory Visit (HOSPITAL_COMMUNITY)
Admission: RE | Admit: 2015-04-05 | Discharge: 2015-04-05 | Disposition: A | Discharge status: Home / Self Care | Payer: 59 | Source: Ambulatory Visit | Attending: Family Medicine | Admitting: Family Medicine

## 2015-04-05 ENCOUNTER — Encounter (HOSPITAL_COMMUNITY): Payer: 59 | Admitting: Physical Therapy

## 2015-04-05 DIAGNOSIS — M545 Low back pain: Secondary | ICD-10-CM | POA: Insufficient documentation

## 2015-04-05 DIAGNOSIS — M5442 Lumbago with sciatica, left side: Secondary | ICD-10-CM

## 2015-04-05 DIAGNOSIS — M5441 Lumbago with sciatica, right side: Secondary | ICD-10-CM

## 2015-04-05 DIAGNOSIS — M5416 Radiculopathy, lumbar region: Secondary | ICD-10-CM

## 2015-04-05 HISTORY — DX: Spondylosis without myelopathy or radiculopathy, lumbar region: M47.816

## 2015-04-05 HISTORY — DX: Chronic sinusitis, unspecified: J32.9

## 2015-04-06 ENCOUNTER — Encounter: Payer: Self-pay | Admitting: Family Medicine

## 2015-04-06 ENCOUNTER — Ambulatory Visit (INDEPENDENT_AMBULATORY_CARE_PROVIDER_SITE_OTHER): Payer: 59 | Admitting: Family Medicine

## 2015-04-06 ENCOUNTER — Encounter (HOSPITAL_COMMUNITY): Payer: Self-pay

## 2015-04-06 VITALS — BP 122/76 | HR 93 | Temp 97.9°F | Resp 16 | Ht 60.0 in | Wt 223.5 lb

## 2015-04-06 DIAGNOSIS — M4726 Other spondylosis with radiculopathy, lumbar region: Secondary | ICD-10-CM

## 2015-04-06 DIAGNOSIS — H66007 Acute suppurative otitis media without spontaneous rupture of ear drum, recurrent, unspecified ear: Secondary | ICD-10-CM

## 2015-04-06 DIAGNOSIS — M5442 Lumbago with sciatica, left side: Secondary | ICD-10-CM | POA: Diagnosis not present

## 2015-04-06 DIAGNOSIS — J329 Chronic sinusitis, unspecified: Secondary | ICD-10-CM

## 2015-04-06 MED ORDER — HYDROCODONE-ACETAMINOPHEN 5-325 MG PO TABS: 1.0000 | ORAL_TABLET | Freq: Four times a day (QID) | ORAL | Status: DC | PRN

## 2015-04-06 MED ORDER — CEFDINIR 300 MG PO CAPS: 300.0000 mg | ORAL_CAPSULE | Freq: Two times a day (BID) | ORAL | Status: DC

## 2015-04-06 NOTE — Progress Notes (Signed)
OFFICE VISIT  04/06/2015   CC:  Chief Complaint  Patient presents with  . Hospitalization Follow-up   HPI:    Patient is a 50 y.o. Caucasian female who presents for ED visit f/u: was seen 03/27/15 for acute sinusitis and L AOM.  She was rx'd augmentin x 10d, a pred pack, and vicodin 5/325 (#20). Finishes augmentin in a couple days.  Still with decreased hearing and sharp pain in L ear.  Still has sinus/nasal congestion and can't get anything to come out.  Using alka seltzer sinus med and flonase, but no saline nasal spray.  Also has been having low back pain that has intermittently had raducular sx's with it and she has not been able to tolerate PT.  We got a L spine MRI on her yesterday and it showed mild lumbar degenerative change without central canal or foraminal stenosis (this seems most notable at L4-5 and L5-S1). Still complaining of chronic low back pain (for the last 2 and 1/2 mo now).  During PT she says her L leg would have recurrent numbness.  Still with this feeling intermittently--says it makes her feel like she is going to fall. Vicodin 5/325, using 1-2 at night usually but on PT days she would use 4 tabs in a day.  She is almost out of this med.  Past Medical History  Diagnosis Date  . Polycystic ovarian syndrome   . Colitis due to Clostridium difficile 2001  . Hepatic hemangioma   . Anxiety   . IBS (irritable bowel syndrome)   . History of Salmonella gastroenteritis   . GERD (gastroesophageal reflux disease)   . Rectal cancer ([place])     a. Followed by Dr. Gala Romney, dx 2000-2001. Tumor removed from rectal.  . Bipolar 1 disorder ([place])   . Migraine headache   . Asthma   . Arthritis   . Hypertension     a. Improved without intervention.  . Allergy   . Tobacco dependence   . Hyperlipidemia     a. Noted 02/2014.  . Morbid obesity ([place])   . Microscopic hematuria     Intermittent (no w/u done yet, as of 03/2014)  . Pruritic dermatitis 2015/2016    +Scabies prep at  [facility] 07/2014. (Primarily pruritic skin, but eventually a subtle rash as well)  . Iron deficiency anemia 05/2014    per pt it is not due to vaginal blood loss; hemoccults sent to pt in mail 413/16.  . Vitamin D deficiency     Past Surgical History  Procedure Laterality Date  . Cholecystectomy    . Colonoscopy  05/05/2007    adenoma  . Situ removed  age 73    High-grade rectal adenoma removed from rectum   . Dilation and curettage of uterus      for vaginal bleeding  . Cardiovascular stress test  03/24/14    Lexscan MIBI: mild anterior ischemia?  Cardiac CT angiogram recommended/done.  . Transthoracic echocardiogram  03/23/14    Normal  . Left heart catheterization with coronary angiogram N/A 03/31/2014    Normal coronaries, EF 0000000, diastolic dysfunction.  Procedure: LEFT HEART CATHETERIZATION WITH CORONARY ANGIOGRAM;  Surgeon: Sinclair Grooms, MD;  Location: [facility] CATH LAB;  Service: Cardiovascular;  Laterality: N/A;  . Coronary ct angio  03/29/14    Two vessel dz/moderate stenosis of mid LAD and proximal RCA; cath recommended.   MEDS: not taking prednisone listed below Outpatient Prescriptions Prior to Visit  Medication Sig Dispense Refill  .  albuterol (PROVENTIL) (2.5 MG/3ML) 0.083% nebulizer solution Take 3 mLs (2.5 mg total) by nebulization every 6 (six) hours as needed for wheezing or shortness of breath. 75 mL 0  . albuterol (VENTOLIN HFA) 108 (90 BASE) MCG/ACT inhaler Inhale 2 puffs into the lungs every 6 (six) hours as needed for wheezing or shortness of breath. 1 Inhaler 1  . amoxicillin-clavulanate (AUGMENTIN) 875-125 MG tablet Take 1 tablet by mouth every 12 (twelve) hours. 20 tablet 0  . aspirin 81 MG chewable tablet Chew 1 tablet (81 mg total) by mouth daily. 14 tablet 0  . busPIRone (BUSPAR) 15 MG tablet Take 30 mg by mouth at bedtime.     . Cetirizine HCl (ZYRTEC ALLERGY) 10 MG CAPS Take 1 capsule (10 mg total) by mouth daily. 30 capsule 11  . clonazePAM  (KLONOPIN) 0.5 MG tablet Takes 1 tablet during the day as needed and 2 tablets at bedtime. 90 tablet 5  . cyclobenzaprine (FLEXERIL) 10 MG tablet Take 1 tablet (10 mg total) by mouth 3 (three) times daily as needed for muscle spasms. 60 tablet 0  . fluticasone (FLONASE) 50 MCG/ACT nasal spray Place 2 sprays into both nostrils daily. 16 g 1  . hydrOXYzine (ATARAX/VISTARIL) 25 MG tablet Take 25 mg by mouth 3 (three) times daily.     Marland Kitchen lamoTRIgine (LAMICTAL) 200 MG tablet Take 200 mg by mouth at bedtime.    . nitroGLYCERIN (NITROSTAT) 0.4 MG SL tablet Place 1 tablet (0.4 mg total) under the tongue every 5 (five) minutes as needed for chest pain. 20 tablet 0  . pantoprazole (PROTONIX) 40 MG tablet TAKE ONE TABLET BY MOUTH ONCE DAILY 30 tablet 6  . promethazine (PHENERGAN) 12.5 MG tablet Take 1 tablet (12.5 mg total) by mouth every 6 (six) hours as needed for nausea or vomiting. 1-2 tabs po q6h prn nausea 30 tablet 0  . HYDROcodone-acetaminophen (NORCO/VICODIN) 5-325 MG tablet Take 2 tablets by mouth every 4 (four) hours as needed. 20 tablet 0  . ibuprofen (ADVIL,MOTRIN) 800 MG tablet Take 1 tablet (800 mg total) by mouth 3 (three) times daily. (Patient not taking: Reported on 04/06/2015) 21 tablet 0  . predniSONE (DELTASONE) 10 MG tablet 6,5,4,3,2,1 taper (Patient not taking: Reported on 04/06/2015) 21 tablet 0   No facility-administered medications prior to visit.    Allergies  Allergen Reactions  . Contrast Media [Iodinated Diagnostic Agents] Anaphylaxis  . Shellfish Allergy Anaphylaxis  . Codeine Nausea And Vomiting    Hallucinations, Also sees people that are not there   . Latex Hives  . Lipitor [Atorvastatin] Itching  . Permethrin Itching    ROS As per HPI  PE: Blood pressure 122/76, pulse 93, temperature 97.9 F (36.6 C), temperature source Oral, resp. rate 16, height 5' (1.524 m), weight 223 lb 8 oz (101.379 kg), last menstrual period 03/22/2015, SpO2 95 %. Gen: Alert, well  appearing.  Patient is oriented to person, place, time, and situation. VS: noted--normal. HEENT: eyes without injection, drainage, or swelling.  Ears: EACs clear, Right TM with normal light reflex and landmarks. L TM erythematous, mildly bulging, and yellow fluid noted in middle ear.  Nose: Light yellowish rhinorrhea, with some dried, crusty exudate adherent to mildly injected and edematous turbinates/ mucosa.  No purulent d/c.  No paranasal sinus TTP.  No facial swelling.  Throat and mouth without focal lesion.  No pharyngial swelling, erythema, or exudate.   Neck: supple, no LAD.   LUNGS: CTA bilat, nonlabored resps.   CV: RRR,  no m/r/g. EXT: no c/c/e SKIN: no rash Back: TTP over spinous processes and facet region at L4 to L5 region on R.  LE strength 5/5 prox/dist bilat. Sitting SLR neg on R. Sitting SLR on left elicits numbness over anterior aspect of left leg/knee per pt report. Patellar reflexes 1+ bilat,  Achilles reflexes absent bilat.   DP and PT pulses 1+ bilat, feet pink and warm.    LABS:  None today  IMPRESSION AND PLAN:  1) Recurrent sinusitis, now with persistent acute OM on L despite being on augmentin x 8d. Finish augmentin, then start cefdinir 300 mg bid x 7d.  Add saline nasal rinses 1-2 times per day. Refer to ENT for further eval. Encouraged pt to quit smoking, as I'm sure this is having some impact on her upper resp sx's.  2) Subacute low back pain, intermittent L leg paresthesias, with a relatively benign-appearing L spine MRI yesterday.  Will ask orthopedist to see her to see if anything else to offer her.  She has essentially finished her initial round of PT.  I gave #120 vicodin 5/325 to use 1-2 q6h prn but I also recommended 600 mg ibuprofen bid with food x 10d.  I also reminded pt that if this turns into a chronic pain situation that requires narcotics I will have to refer her to a pain mgmt specialist.  She expressed understanding.  An After Visit Summary was  printed and given to the patient.  Spent 25 min with pt today, with >50% of this time spent in counseling and care coordination regarding the above problems.  FOLLOW UP: Return for 6-8 wks 30 min f/u low back pain and recurr sinusitis.

## 2015-04-06 NOTE — Progress Notes (Signed)
Pre visit review using our clinic review tool, if applicable. No additional management support is needed unless otherwise documented below in the visit note. 

## 2015-04-07 ENCOUNTER — Encounter (HOSPITAL_COMMUNITY): Payer: 59 | Admitting: Physical Therapy

## 2015-04-12 ENCOUNTER — Telehealth: Payer: Self-pay | Admitting: Family Medicine

## 2015-04-12 NOTE — Telephone Encounter (Signed)
Pt called stating she had allergic reaction to abx prescribed at last visit.  I spoke to Dr. Anitra Lauth and informed patient to stop all abx and begin benadryl 25 mg - 50 mg every 6 hours as needed.  No further abx at this time.  Patient is to be seen if no better or worsens.

## 2015-04-13 ENCOUNTER — Other Ambulatory Visit (HOSPITAL_COMMUNITY): Payer: 59

## 2015-04-28 ENCOUNTER — Other Ambulatory Visit: Payer: Self-pay | Admitting: Family Medicine

## 2015-04-28 NOTE — Telephone Encounter (Signed)
RF request for clonazepam LOV: 03/02/15 Next ov: None Last written: 10/04/14 #90 w/ 5RF  Please advise. Thanks.

## 2015-04-28 NOTE — Telephone Encounter (Signed)
Rx faxed

## 2015-05-04 ENCOUNTER — Other Ambulatory Visit: Payer: Self-pay | Admitting: *Deleted

## 2015-05-04 ENCOUNTER — Ambulatory Visit (INDEPENDENT_AMBULATORY_CARE_PROVIDER_SITE_OTHER): Payer: Commercial Managed Care - PPO | Admitting: Orthopedic Surgery

## 2015-05-04 ENCOUNTER — Encounter: Payer: Self-pay | Admitting: Orthopedic Surgery

## 2015-05-04 VITALS — BP 143/100 | Ht 60.0 in | Wt 223.0 lb

## 2015-05-04 DIAGNOSIS — M5136 Other intervertebral disc degeneration, lumbar region: Secondary | ICD-10-CM | POA: Diagnosis not present

## 2015-05-04 MED ORDER — ALBUTEROL SULFATE HFA 108 (90 BASE) MCG/ACT IN AERS: 2.0000 | INHALATION_SPRAY | Freq: Four times a day (QID) | RESPIRATORY_TRACT | Status: DC | PRN
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
## 2015-05-04 NOTE — Patient Instructions (Signed)
Call us when you are ready to move forward with Epidural steroid injections

## 2015-05-04 NOTE — Telephone Encounter (Signed)
RF request for albuterol inhaler LOV: 04/06/15 Next ov: None Last written: 03/02/15 #1 w/ 1RF

## 2015-05-09 ENCOUNTER — Telehealth: Payer: Self-pay | Admitting: Orthopedic Surgery

## 2015-05-09 NOTE — Telephone Encounter (Signed)
Yes for esi

## 2015-05-09 NOTE — Telephone Encounter (Signed)
Per most recent office visit 05/04/15, patient has decided to have Dr Aline Brochure make the referral for epidural steroid injection, as noted.  Please advise - patient ph# [phone].

## 2015-05-09 NOTE — Telephone Encounter (Signed)
Plan  I rec: ESI at L4-5 She would like to think about the epidural injections. I warned her about opioid use for back pain she will continue with ibuprofen and muscle relaxers.    Okay to proceed?

## 2015-05-11 ENCOUNTER — Encounter (HOSPITAL_COMMUNITY): Payer: Self-pay | Admitting: Emergency Medicine

## 2015-05-11 ENCOUNTER — Emergency Department (HOSPITAL_COMMUNITY)
Admission: EM | Admit: 2015-05-11 | Discharge: 2015-05-12 | Disposition: A | Discharge status: Home / Self Care | Payer: Commercial Managed Care - PPO | Attending: Emergency Medicine | Admitting: Emergency Medicine

## 2015-05-11 ENCOUNTER — Other Ambulatory Visit: Payer: Self-pay | Admitting: *Deleted

## 2015-05-11 DIAGNOSIS — Z85048 Personal history of other malignant neoplasm of rectum, rectosigmoid junction, and anus: Secondary | ICD-10-CM | POA: Insufficient documentation

## 2015-05-11 DIAGNOSIS — Z91040 Latex allergy status: Secondary | ICD-10-CM | POA: Diagnosis not present

## 2015-05-11 DIAGNOSIS — G43809 Other migraine, not intractable, without status migrainosus: Secondary | ICD-10-CM | POA: Insufficient documentation

## 2015-05-11 DIAGNOSIS — Z872 Personal history of diseases of the skin and subcutaneous tissue: Secondary | ICD-10-CM | POA: Insufficient documentation

## 2015-05-11 DIAGNOSIS — M48061 Spinal stenosis, lumbar region without neurogenic claudication: Secondary | ICD-10-CM

## 2015-05-11 DIAGNOSIS — J45909 Unspecified asthma, uncomplicated: Secondary | ICD-10-CM | POA: Insufficient documentation

## 2015-05-11 DIAGNOSIS — R51 Headache: Secondary | ICD-10-CM | POA: Diagnosis present

## 2015-05-11 DIAGNOSIS — Z9889 Other specified postprocedural states: Secondary | ICD-10-CM | POA: Insufficient documentation

## 2015-05-11 DIAGNOSIS — Z862 Personal history of diseases of the blood and blood-forming organs and certain disorders involving the immune mechanism: Secondary | ICD-10-CM | POA: Insufficient documentation

## 2015-05-11 DIAGNOSIS — I10 Essential (primary) hypertension: Secondary | ICD-10-CM | POA: Insufficient documentation

## 2015-05-11 DIAGNOSIS — Z7951 Long term (current) use of inhaled steroids: Secondary | ICD-10-CM | POA: Insufficient documentation

## 2015-05-11 DIAGNOSIS — F419 Anxiety disorder, unspecified: Secondary | ICD-10-CM | POA: Diagnosis not present

## 2015-05-11 DIAGNOSIS — Z8619 Personal history of other infectious and parasitic diseases: Secondary | ICD-10-CM | POA: Insufficient documentation

## 2015-05-11 DIAGNOSIS — Z7982 Long term (current) use of aspirin: Secondary | ICD-10-CM | POA: Diagnosis not present

## 2015-05-11 DIAGNOSIS — F319 Bipolar disorder, unspecified: Secondary | ICD-10-CM | POA: Diagnosis not present

## 2015-05-11 DIAGNOSIS — F17210 Nicotine dependence, cigarettes, uncomplicated: Secondary | ICD-10-CM | POA: Insufficient documentation

## 2015-05-11 DIAGNOSIS — M1990 Unspecified osteoarthritis, unspecified site: Secondary | ICD-10-CM | POA: Diagnosis not present

## 2015-05-11 NOTE — Telephone Encounter (Signed)
Order faxed to [facility]

## 2015-05-11 NOTE — ED Notes (Signed)
Patient complaining of headache since last night. States "It's not like my usual migraines, sometimes when I have allergic reactions I get a headache and I think I may have had a reaction to something I ate last night." States she took ibuprofen with no relief.

## 2015-05-12 ENCOUNTER — Ambulatory Visit (INDEPENDENT_AMBULATORY_CARE_PROVIDER_SITE_OTHER): Payer: Commercial Managed Care - PPO | Admitting: Otolaryngology

## 2015-05-12 MED ORDER — PROMETHAZINE HCL 25 MG PO TABS: 25.0000 mg | ORAL_TABLET | Freq: Four times a day (QID) | ORAL | Status: DC | PRN

## 2015-05-12 MED ORDER — HYDROCODONE-ACETAMINOPHEN 5-325 MG PO TABS: 2.0000 | ORAL_TABLET | Freq: Once | ORAL | Status: DC

## 2015-05-12 MED ORDER — HYDROCODONE-ACETAMINOPHEN 5-325 MG PO TABS
2.0000 | ORAL_TABLET | Freq: Once | ORAL | Status: AC
  Administered 2015-05-12: 2 via ORAL
  Filled 2015-05-12: qty 2

## 2015-05-12 MED ORDER — PROMETHAZINE HCL 12.5 MG PO TABS
ORAL_TABLET | ORAL | Status: AC
  Filled 2015-05-12: qty 1

## 2015-05-12 MED ORDER — PROMETHAZINE HCL 12.5 MG PO TABS
25.0000 mg | ORAL_TABLET | Freq: Once | ORAL | Status: AC
  Administered 2015-05-12: 25 mg via ORAL
  Filled 2015-05-12: qty 2

## 2015-05-12 MED ORDER — CYCLOBENZAPRINE HCL 10 MG PO TABS
10.0000 mg | ORAL_TABLET | Freq: Once | ORAL | Status: AC
Start: 2015-05-12 — End: 2015-05-12
  Administered 2015-05-12: 10 mg via ORAL
  Filled 2015-05-12: qty 1

## 2015-05-12 MED ORDER — PROMETHAZINE HCL 12.5 MG PO TABS: 25.0000 mg | ORAL_TABLET | Freq: Once | ORAL | Status: DC

## 2015-05-12 MED ORDER — CYCLOBENZAPRINE HCL 10 MG PO TABS: 10.0000 mg | ORAL_TABLET | Freq: Four times a day (QID) | ORAL | Status: DC | PRN

## 2015-05-12 MED ORDER — HYDROCODONE-ACETAMINOPHEN 5-325 MG PO TABS: 1.0000 | ORAL_TABLET | ORAL | Status: DC | PRN

## 2015-05-12 NOTE — Discharge Instructions (Signed)
Please use Norco, and Flexeril for headache pain, and spasm related to headache. Use promethazine for nausea related to your headache, or medication. These 3 medications can cause drowsiness, please use a precaution. Please take them to Dr. Ernestine Conrad so that they may become part of your medical record, and to establish a plan for breakthrough headache pain. Migraine Headache A migraine headache is very bad, throbbing pain on one or both sides of your head. Talk to your doctor about what things may bring on (trigger) your migraine headaches. HOME CARE  Only take medicines as told by your doctor.  Lie down in a dark, quiet room when you have a migraine.  Keep a journal to find out if certain things bring on migraine headaches. For example, write down:  What you eat and drink.  How much sleep you get.  Any change to your diet or medicines.  Lessen how much alcohol you drink.  Quit smoking if you smoke.  Get enough sleep.  Lessen any stress in your life.  Keep lights dim if bright lights bother you or make your migraines worse. GET HELP RIGHT AWAY IF:   Your migraine becomes really bad.  You have a fever.  You have a stiff neck.  You have trouble seeing.  Your muscles are weak, or you lose muscle control.  You lose your balance or have trouble walking.  You feel like you will pass out (faint), or you pass out.  You have really bad symptoms that are different than your first symptoms. MAKE SURE YOU:   Understand these instructions.  Will watch your condition.  Will get help right away if you are not doing well or get worse.   This information is not intended to replace advice given to you by your health care provider. Make sure you discuss any questions you have with your health care provider.   Document Released: 12/20/2007 Document Revised: 06/04/2011 Document Reviewed: 11/17/2012 Elsevier Interactive Patient Education [employer].

## 2015-05-12 NOTE — ED Provider Notes (Signed)
CSN: [id number]     Arrival date & time 05/11/15  2248 History   First MD Initiated Contact with Patient 05/12/15 0020     Chief Complaint  Patient presents with  . Headache     (Consider location/radiation/quality/duration/timing/severity/associated sxs/prior Treatment) Patient is a 50 y.o. female presenting with headaches. The history is provided by the patient.  Headache Pain location:  L temporal Quality:  Sharp Radiates to: right temporal area. Severity currently:  6/10 Duration:  1 day Timing:  Intermittent Chronicity: acute on chronic. Context: eating   Context comment:  Pt was awaken from sleep last night with symptoms of allergic  reaction and headache. Relieved by:  Nothing Ineffective treatments: benadryl. Associated symptoms: blurred vision and dizziness   Associated symptoms: no neck pain, no seizures and no syncope     Past Medical History  Diagnosis Date  . Polycystic ovarian syndrome   . Colitis due to Clostridium difficile 2001  . Hepatic hemangioma   . Anxiety   . IBS (irritable bowel syndrome)   . History of Salmonella gastroenteritis   . GERD (gastroesophageal reflux disease)   . Rectal cancer ([place])     a. Followed by Dr. Gala Romney, dx 2000-2001. Tumor removed from rectal.  . Bipolar 1 disorder ([place])   . Migraine headache   . Asthma   . Arthritis   . Hypertension     a. Improved without intervention.  . Recurrent sinusitis     +allergic rhinitis  . Tobacco dependence   . Hyperlipidemia     a. Noted 02/2014.  . Morbid obesity ([place])   . Microscopic hematuria     Intermittent (no w/u done yet, as of 03/2014)  . Pruritic dermatitis 2015/2016    +Scabies prep at [facility] 07/2014. (Primarily pruritic skin, but eventually a subtle rash as well)  . Iron deficiency anemia 05/2014    per pt it is not due to vaginal blood loss; hemoccults sent to pt in mail 413/16.  . Vitamin D deficiency   . Lumbar spondylosis     mild, mainly focused at L4-5  and L5-S1.   Past Surgical History  Procedure Laterality Date  . Cholecystectomy    . Colonoscopy  05/05/2007    adenoma  . Situ removed  age 52    High-grade rectal adenoma removed from rectum   . Dilation and curettage of uterus      for vaginal bleeding  . Cardiovascular stress test  03/24/14    Lexscan MIBI: mild anterior ischemia?  Cardiac CT angiogram recommended/done.  . Transthoracic echocardiogram  03/23/14    Normal  . Left heart catheterization with coronary angiogram N/A 03/31/2014    Normal coronaries, EF 0000000, diastolic dysfunction.  Procedure: LEFT HEART CATHETERIZATION WITH CORONARY ANGIOGRAM;  Surgeon: Sinclair Grooms, MD;  Location: [facility] CATH LAB;  Service: Cardiovascular;  Laterality: N/A;  . Coronary ct angio  03/29/14    Two vessel dz/moderate stenosis of mid LAD and proximal RCA; cath recommended.   Family History  Problem Relation Age of Onset  . Colon cancer Father 22  . Cancer Father   . Diverticulitis Mother   . Heart disease Mother   . Hypertension Mother   . Hyperlipidemia Mother   . Mental illness Mother   . Diabetes Mother   . Heart attack Mother   . Stroke Neg Hx    Social History  Substance Use Topics  . Smoking status: Current Every Day Smoker -- 0.50 packs/day for  18 years    Types: Cigarettes  . Smokeless tobacco: Never Used     Comment: 25 years  . Alcohol Use: No   OB History    Gravida Para Term Preterm AB TAB SAB Ectopic Multiple Living   4 1 1  3  3   1      Review of Systems  Eyes: Positive for blurred vision.  Cardiovascular: Negative for syncope.  Musculoskeletal: Negative for neck pain.  Neurological: Positive for dizziness and headaches. Negative for seizures.  All other systems reviewed and are negative.     Allergies  Contrast media; Shellfish allergy; Codeine; Latex; Lipitor; and Permethrin  Home Medications   Prior to Admission medications   Medication Sig Start Date End Date Taking? Authorizing Provider   albuterol (PROVENTIL) (2.5 MG/3ML) 0.083% nebulizer solution Take 3 mLs (2.5 mg total) by nebulization every 6 (six) hours as needed for wheezing or shortness of breath. 10/22/14   Tammy Triplett, PA-C  albuterol (VENTOLIN HFA) 108 (90 Base) MCG/ACT inhaler Inhale 2 puffs into the lungs every 6 (six) hours as needed for wheezing or shortness of breath. 05/04/15   Tammi Sou, MD  aspirin 81 MG chewable tablet Chew 1 tablet (81 mg total) by mouth daily. 09/06/12   Ripley Fraise, MD  busPIRone (BUSPAR) 15 MG tablet Take 30 mg by mouth at bedtime.     Historical Provider, MD  cefdinir (OMNICEF) 300 MG capsule Take 1 capsule (300 mg total) by mouth 2 (two) times daily. 04/06/15   Tammi Sou, MD  Cetirizine HCl (ZYRTEC ALLERGY) 10 MG CAPS Take 1 capsule (10 mg total) by mouth daily. 12/17/14   Tammi Sou, MD  clonazePAM (KLONOPIN) 0.5 MG tablet TAKE ONE TABLET BY MOUTH DURING THE DAY AS NEEDED THEN TAKE TWO TABLETS DAILY AT BEDTIME 04/28/15   Tammi Sou, MD  cyclobenzaprine (FLEXERIL) 10 MG tablet Take 1 tablet (10 mg total) by mouth 3 (three) times daily as needed for muscle spasms. 03/02/15   Tammi Sou, MD  fluticasone (FLONASE) 50 MCG/ACT nasal spray Place 2 sprays into both nostrils daily. 08/24/14   Kristen N Ward, DO  HYDROcodone-acetaminophen (NORCO/VICODIN) 5-325 MG tablet Take 1-2 tablets by mouth every 6 (six) hours as needed. 04/06/15   Tammi Sou, MD  hydrOXYzine (ATARAX/VISTARIL) 25 MG tablet Take 25 mg by mouth 3 (three) times daily.     Historical Provider, MD  ibuprofen (ADVIL,MOTRIN) 600 MG tablet 600 mg.    Historical Provider, MD  lamoTRIgine (LAMICTAL) 200 MG tablet Take 200 mg by mouth at bedtime.    Historical Provider, MD  nitroGLYCERIN (NITROSTAT) 0.4 MG SL tablet Place 1 tablet (0.4 mg total) under the tongue every 5 (five) minutes as needed for chest pain. 03/25/14   Eugenie Filler, MD  pantoprazole (PROTONIX) 40 MG tablet TAKE ONE TABLET BY MOUTH  ONCE DAILY 10/04/14   Tammi Sou, MD  promethazine (PHENERGAN) 12.5 MG tablet Take 1 tablet (12.5 mg total) by mouth every 6 (six) hours as needed for nausea or vomiting. 1-2 tabs po q6h prn nausea 03/08/15   Tammi Sou, MD   BP 158/90 mmHg  Pulse 98  Temp(Src) 97.8 F (36.6 C) (Oral)  Resp 16  Ht 5' (1.524 m)  Wt 99.791 kg  BMI 42.97 kg/m2  SpO2 100%  LMP 04/15/2015 Physical Exam  Constitutional: She is oriented to person, place, and time. She appears well-developed and well-nourished.  Non-toxic appearance.  HENT:  Head: Normocephalic.  Right Ear: Tympanic membrane and external ear normal.  Left Ear: Tympanic membrane and external ear normal.  Eyes: EOM and lids are normal. Pupils are equal, round, and reactive to light.  Neck: Normal range of motion. Neck supple. Carotid bruit is not present.  No carotid bruit.  Cardiovascular: Normal rate, regular rhythm, normal heart sounds, intact distal pulses and normal pulses.   Pulmonary/Chest: Breath sounds normal. No respiratory distress.  Abdominal: Soft. Bowel sounds are normal. There is no tenderness. There is no guarding.  Musculoskeletal: Normal range of motion.  There is increased tightness and tenseness of the upper trapezius extending into the neck and cranial insertion area.  Lymphadenopathy:       Head (right side): No submandibular adenopathy present.       Head (left side): No submandibular adenopathy present.    She has no cervical adenopathy.  Neurological: She is alert and oriented to person, place, and time. She has normal strength. No cranial nerve deficit or sensory deficit. She exhibits normal muscle tone. Coordination normal.  Gait steady. Grip symmetrical There is no deviation of the case. There is no weakness of the eyelids. There is no decrease in sensation of the face. The face is symmetrical with smile. The patient raises the soft palate symmetrically. The tongue is in the midline. There is no pronator  drifts noted. This no change in town of the upper or lower extremity muscles.  Skin: Skin is warm and dry.  Psychiatric: She has a normal mood and affect. Her speech is normal.  Nursing note and vitals reviewed.   ED Course  Procedures (including critical care time) Labs Review Labs Reviewed - No data to display  Imaging Review No results found. I have personally reviewed and evaluated these images and lab results as part of my medical decision-making.   EKG Interpretation None      MDM Vital signs reviewed. No gross neurologic deficits are appreciated. The patient is treated in the emergency department with Norco and promethazine and Flexeril. Patient is to follow with the primary care physician to complete the workup. A prescription for Flexeril and Norco given to the patient. Questions were answered. Feel it is safe for the patient be discharged home at this time.    Final diagnoses:  None    **I have reviewed nursing notes, vital signs, and all appropriate lab and imaging results for this patient.Lily Kocher, PA-C 05/12/15 [id number]  Rolland Porter, MD 05/12/15 [phone]

## 2015-05-24 ENCOUNTER — Ambulatory Visit
Admission: RE | Admit: 2015-05-24 | Discharge: 2015-05-24 | Disposition: A | Discharge status: Home / Self Care | Payer: Commercial Managed Care - PPO | Source: Ambulatory Visit | Attending: Orthopedic Surgery | Admitting: Orthopedic Surgery

## 2015-05-24 ENCOUNTER — Other Ambulatory Visit: Payer: Self-pay | Admitting: Orthopedic Surgery

## 2015-05-24 DIAGNOSIS — M48061 Spinal stenosis, lumbar region without neurogenic claudication: Secondary | ICD-10-CM

## 2015-05-24 MED ORDER — IOHEXOL 180 MG/ML  SOLN
1.0000 mL | Freq: Once | INTRAMUSCULAR | Status: AC | PRN
  Administered 2015-05-24: 1 mL via EPIDURAL

## 2015-05-24 MED ORDER — DIAZEPAM 5 MG PO TABS
10.0000 mg | ORAL_TABLET | Freq: Once | ORAL | Status: AC
  Administered 2015-05-24: 10 mg via ORAL

## 2015-05-24 MED ORDER — METHYLPREDNISOLONE ACETATE 40 MG/ML INJ SUSP (RADIOLOG
120.0000 mg | Freq: Once | INTRAMUSCULAR | Status: AC
  Administered 2015-05-24: 120 mg via EPIDURAL

## 2015-05-24 NOTE — Progress Notes (Signed)
Pt given 13 hour prep script and appointment for 06/09/15.

## 2015-05-24 NOTE — Discharge Instructions (Signed)

## 2015-05-31 ENCOUNTER — Telehealth: Payer: Self-pay

## 2015-05-31 NOTE — Telephone Encounter (Signed)
Pt called and stated that her inhaler was left in her car and now it is not there. Pt request for another one to be sent to her pharmacy. Last rx was sent 05/04/2015 with 1 refill. Informed pt of same. Pt stated that her inhaler did not have any refills left. I explained to pt that she be using one from a previous fill and that the pharmacy doesn't delete an rx with refills even after the same rx is resent in. Pt stated that she would call the pharmacy and let us know if we needed to authorized another refill. Informed pt that we would authorized her inhaler.

## 2015-06-01 NOTE — Therapy (Signed)
[place], [place], [address] Phone: [phone]   Fax:  [phone]  Patient Details  Name: Martha Espinoza MRN: [id number] Date of Birth: 1965-03-31 Referring Provider:  Tammi Sou, MD  Encounter Date: 06/01/2015   PHYSICAL THERAPY DISCHARGE SUMMARY  Visits from Start of Care: 5  Current functional level related to goals / functional outcomes: Patient has not returned since time of last skilled PT session    Remaining deficits: Unable to assess    Education / Equipment: N/A  Plan: Patient agrees to discharge.  Patient goals were not met. Patient is being discharged due to not returning since the last visit.  ?????       Deniece Ree PT, DPT [facility] [address] [place], [place], [address] Phone: [phone]   Fax:  [phone]

## 2015-06-02 ENCOUNTER — Telehealth: Payer: Self-pay | Admitting: Orthopedic Surgery

## 2015-06-02 NOTE — Telephone Encounter (Signed)
Patient called to request Dr Aline Brochure to order [facility], to help her until she has her next epidural injection; states her primary care provider prescribed it in past, but thought since her PCP referred her to Dr Aline Brochure, that he would need to order. [facility]. Patient ph# [phone]

## 2015-06-02 NOTE — Telephone Encounter (Signed)
Routing to Dr Harrison for approval 

## 2015-06-02 NOTE — Telephone Encounter (Signed)
Patient attended appointment at [facility] for epidural on 05/24/15, and has follow up injection scheduled 06/09/15.

## 2015-06-03 ENCOUNTER — Other Ambulatory Visit: Payer: Self-pay | Admitting: Orthopedic Surgery

## 2015-06-03 MED ORDER — CYCLOBENZAPRINE HCL 10 MG PO TABS: 10.0000 mg | ORAL_TABLET | Freq: Four times a day (QID) | ORAL | Status: DC | PRN

## 2015-06-09 ENCOUNTER — Other Ambulatory Visit: Payer: Commercial Managed Care - PPO

## 2015-06-11 ENCOUNTER — Other Ambulatory Visit: Payer: Self-pay | Admitting: Family Medicine

## 2015-06-13 ENCOUNTER — Other Ambulatory Visit: Payer: Self-pay

## 2015-06-13 MED ORDER — PANTOPRAZOLE SODIUM 40 MG PO TBEC: 40.0000 mg | DELAYED_RELEASE_TABLET | Freq: Every day | ORAL | Status: DC

## 2015-06-13 NOTE — Telephone Encounter (Signed)
Refill request from [facility] for pantoprazole.  Patient last seen 03/2015.

## 2015-06-23 ENCOUNTER — Other Ambulatory Visit: Payer: Commercial Managed Care - PPO

## 2015-06-28 ENCOUNTER — Other Ambulatory Visit: Payer: Commercial Managed Care - PPO

## 2015-06-30 ENCOUNTER — Ambulatory Visit
Admission: RE | Admit: 2015-06-30 | Discharge: 2015-06-30 | Disposition: A | Discharge status: Home / Self Care | Payer: Commercial Managed Care - PPO | Source: Ambulatory Visit | Attending: Orthopedic Surgery | Admitting: Orthopedic Surgery

## 2015-06-30 DIAGNOSIS — M48061 Spinal stenosis, lumbar region without neurogenic claudication: Secondary | ICD-10-CM

## 2015-06-30 MED ORDER — METHYLPREDNISOLONE ACETATE 40 MG/ML INJ SUSP (RADIOLOG
120.0000 mg | Freq: Once | INTRAMUSCULAR | Status: AC
  Administered 2015-06-30: 120 mg via EPIDURAL

## 2015-06-30 MED ORDER — DIAZEPAM 5 MG PO TABS
10.0000 mg | ORAL_TABLET | Freq: Once | ORAL | Status: AC
  Administered 2015-06-30: 10 mg via ORAL

## 2015-06-30 MED ORDER — IOHEXOL 180 MG/ML  SOLN
1.0000 mL | Freq: Once | INTRAMUSCULAR | Status: AC | PRN
  Administered 2015-06-30: 1 mL via EPIDURAL

## 2015-06-30 NOTE — Discharge Instructions (Signed)

## 2015-07-01 ENCOUNTER — Other Ambulatory Visit: Payer: Self-pay | Admitting: *Deleted

## 2015-07-01 NOTE — Telephone Encounter (Signed)
Pt called requesting refills.   LOV: 04/06/15 NOV: None  RF request for hydrocodone Last written: 05/12/15 #15 w/ 0RF  RF request for phenergan Last written: 05/12/15 #12 w/ 0RF  Please advise. Thanks.

## 2015-07-04 MED ORDER — PROMETHAZINE HCL 25 MG PO TABS: 25.0000 mg | ORAL_TABLET | Freq: Four times a day (QID) | ORAL | Status: DC | PRN

## 2015-07-04 MED ORDER — HYDROCODONE-ACETAMINOPHEN 5-325 MG PO TABS: 1.0000 | ORAL_TABLET | Freq: Four times a day (QID) | ORAL | Status: DC | PRN

## 2015-07-04 NOTE — Telephone Encounter (Signed)
Patient checking on Rx. Please call her

## 2015-07-04 NOTE — Telephone Encounter (Signed)
Please advise. Thanks.  

## 2015-07-04 NOTE — Telephone Encounter (Signed)
Spoke to pt and she stated that she is going through rounds of injections in her back by her orthopedic Dr. Aline Brochure. She stated that she wasn't sure who should fill muscle relaxer. Please advise. Thanks.

## 2015-07-04 NOTE — Telephone Encounter (Signed)
Pls call and ask why she needs me to prescribe her pain medication. She may need a referral to a pain mgmt specialist if she is having chronic pain now.  Let me know-thx

## 2015-07-04 NOTE — Telephone Encounter (Signed)
Per Dr. Anitra Lauth will fill Rx's for a small amount. Pt needs to advise Dr. Aline Brochure that she is having pain or no relief from the injections and if she continues to have pain she will need to be referred to pain management. Pt advised and voiced understanding.  Rx's put up front for p/u.

## 2015-07-18 ENCOUNTER — Ambulatory Visit (INDEPENDENT_AMBULATORY_CARE_PROVIDER_SITE_OTHER): Payer: Commercial Managed Care - PPO | Admitting: Family Medicine

## 2015-07-18 ENCOUNTER — Encounter: Payer: Self-pay | Admitting: Family Medicine

## 2015-07-18 VITALS — BP 128/75 | HR 106 | Temp 98.1°F | Resp 16 | Ht 60.0 in | Wt 233.8 lb

## 2015-07-18 DIAGNOSIS — B86 Scabies: Secondary | ICD-10-CM | POA: Diagnosis not present

## 2015-07-18 MED ORDER — IVERMECTIN 3 MG PO TABS: ORAL_TABLET | ORAL | Status: DC

## 2015-07-18 NOTE — Patient Instructions (Signed)

## 2015-07-18 NOTE — Progress Notes (Signed)
Pre visit review using our clinic review tool, if applicable. No additional management support is needed unless otherwise documented below in the visit note. 

## 2015-07-18 NOTE — Progress Notes (Signed)
OFFICE NOTE  07/18/2015  CC:  Chief Complaint  Patient presents with  . Rash    x 10 days   HPI: Patient is a 50 y.o. Caucasian female who is here for a rash.  Onset 3-4 days ago, itchy little bumps and fissures on hands, now on buttocks, also on feet and she feels itching on backs of calves last couple hours. No fevers.  No known contacts with anyone with similar rash, but this rash is c/w the appearance of her past scabies rash (that was verified by a dermatologist's microscope exam, i.e a mite was found). She is allergic (her word) to all creams, so the scabies she had about a year ago was successfully treated with PO ivermectin.   She lives with her mom and pt says he mom has started to develop similar skin lesions in the last few days as well.  Pertinent PMH:  Past medical, surgical, social, and family history reviewed and no changes are noted since last office visit.  MEDS:  Outpatient Prescriptions Prior to Visit  Medication Sig Dispense Refill  . albuterol (PROVENTIL) (2.5 MG/3ML) 0.083% nebulizer solution Take 3 mLs (2.5 mg total) by nebulization every 6 (six) hours as needed for wheezing or shortness of breath. 75 mL 0  . albuterol (VENTOLIN HFA) 108 (90 Base) MCG/ACT inhaler Inhale 2 puffs into the lungs every 6 (six) hours as needed for wheezing or shortness of breath. 1 Inhaler 1  . aspirin 81 MG chewable tablet Chew 1 tablet (81 mg total) by mouth daily. 14 tablet 0  . busPIRone (BUSPAR) 15 MG tablet Take 30 mg by mouth at bedtime.     . Cetirizine HCl (ZYRTEC ALLERGY) 10 MG CAPS Take 1 capsule (10 mg total) by mouth daily. 30 capsule 11  . clonazePAM (KLONOPIN) 0.5 MG tablet TAKE ONE TABLET BY MOUTH DURING THE DAY AS NEEDED THEN TAKE TWO TABLETS DAILY AT BEDTIME 90 tablet 5  . cyclobenzaprine (FLEXERIL) 10 MG tablet Take 1 tablet (10 mg total) by mouth every 6 (six) hours as needed (spasm related to headaches). 42 tablet 5  . fluticasone (FLONASE) 50 MCG/ACT nasal spray  Place 2 sprays into both nostrils daily. 16 g 1  . HYDROcodone-acetaminophen (NORCO/VICODIN) 5-325 MG tablet Take 1-2 tablets by mouth every 6 (six) hours as needed. 30 tablet 0  . hydrOXYzine (ATARAX/VISTARIL) 25 MG tablet Take 25 mg by mouth 3 (three) times daily.     Marland Kitchen ibuprofen (ADVIL,MOTRIN) 600 MG tablet 600 mg.    . lamoTRIgine (LAMICTAL) 200 MG tablet Take 200 mg by mouth at bedtime.    . nitroGLYCERIN (NITROSTAT) 0.4 MG SL tablet Place 1 tablet (0.4 mg total) under the tongue every 5 (five) minutes as needed for chest pain. 20 tablet 0  . pantoprazole (PROTONIX) 40 MG tablet Take 1 tablet (40 mg total) by mouth daily. 90 tablet 0  . promethazine (PHENERGAN) 25 MG tablet Take 1 tablet (25 mg total) by mouth every 6 (six) hours as needed for nausea or vomiting. 30 tablet 0  . cefdinir (OMNICEF) 300 MG capsule Take 1 capsule (300 mg total) by mouth 2 (two) times daily. (Patient not taking: Reported on 07/18/2015) 14 capsule 0   No facility-administered medications prior to visit.    PE: Blood pressure 128/75, pulse 106, temperature 98.1 F (36.7 C), temperature source Oral, resp. rate 16, height 5' (1.524 m), weight 233 lb 12 oz (106.028 kg), last menstrual period 07/12/2015, SpO2 95 %. Gen: Alert, well  appearing.  Patient is oriented to person, place, time, and situation. SKIN: on fingers and in web space between fingers she has some light pink, small papules and a few small crusted erosions.  No tenderness, erythema, or streaking.  No soft tissue swelling.    IMPRESSION AND PLAN:  Scabies, new infestation beginning in a patient with a remote past hx of scabies. Pt is intolerant of all creams, so elimite is not a treatment option. Treated her with ivermectin 3 mg tabs, 5 tabs po x 1 dose, may repeat dose in 7-14d. Discussed home/clothes cleaning that must be done.  An After Visit Summary was printed and given to the patient.  FOLLOW UP: prn   Signed:  Crissie Sickles, MD            07/18/2015

## 2015-07-19 ENCOUNTER — Emergency Department (HOSPITAL_COMMUNITY): Payer: Commercial Managed Care - PPO

## 2015-07-19 ENCOUNTER — Encounter (HOSPITAL_COMMUNITY): Payer: Self-pay | Admitting: *Deleted

## 2015-07-19 ENCOUNTER — Emergency Department (HOSPITAL_COMMUNITY)
Admission: EM | Admit: 2015-07-19 | Discharge: 2015-07-19 | Disposition: A | Discharge status: Home / Self Care | Payer: Commercial Managed Care - PPO | Attending: Emergency Medicine | Admitting: Emergency Medicine

## 2015-07-19 DIAGNOSIS — T7840XA Allergy, unspecified, initial encounter: Secondary | ICD-10-CM

## 2015-07-19 DIAGNOSIS — R110 Nausea: Secondary | ICD-10-CM | POA: Insufficient documentation

## 2015-07-19 DIAGNOSIS — I10 Essential (primary) hypertension: Secondary | ICD-10-CM | POA: Insufficient documentation

## 2015-07-19 DIAGNOSIS — M549 Dorsalgia, unspecified: Secondary | ICD-10-CM | POA: Diagnosis not present

## 2015-07-19 DIAGNOSIS — J45909 Unspecified asthma, uncomplicated: Secondary | ICD-10-CM | POA: Diagnosis not present

## 2015-07-19 DIAGNOSIS — E785 Hyperlipidemia, unspecified: Secondary | ICD-10-CM | POA: Insufficient documentation

## 2015-07-19 DIAGNOSIS — F319 Bipolar disorder, unspecified: Secondary | ICD-10-CM | POA: Insufficient documentation

## 2015-07-19 DIAGNOSIS — R0789 Other chest pain: Secondary | ICD-10-CM | POA: Insufficient documentation

## 2015-07-19 DIAGNOSIS — F17210 Nicotine dependence, cigarettes, uncomplicated: Secondary | ICD-10-CM | POA: Diagnosis not present

## 2015-07-19 DIAGNOSIS — Z79899 Other long term (current) drug therapy: Secondary | ICD-10-CM | POA: Insufficient documentation

## 2015-07-19 DIAGNOSIS — Z7982 Long term (current) use of aspirin: Secondary | ICD-10-CM | POA: Insufficient documentation

## 2015-07-19 DIAGNOSIS — R079 Chest pain, unspecified: Secondary | ICD-10-CM

## 2015-07-19 LAB — BASIC METABOLIC PANEL
Anion gap: 11 (ref 5–15)
BUN: 12 mg/dL (ref 6–20)
CO2: 21 mmol/L — ABNORMAL LOW (ref 22–32)
Calcium: 8.9 mg/dL (ref 8.9–10.3)
Chloride: 109 mmol/L (ref 101–111)
Creatinine, Ser: 0.69 mg/dL (ref 0.44–1.00)
GFR calc Af Amer: >60 mL/min (ref >60)
GFR calc non Af Amer: >60 mL/min (ref >60)
Glucose, Bld: 155 mg/dL — ABNORMAL HIGH (ref 65–99)
Potassium: 3.6 mmol/L (ref 3.5–5.1)
Sodium: 141 mmol/L (ref 135–145)

## 2015-07-19 LAB — D-DIMER, QUANTITATIVE (NOT AT ARMC): D-Dimer, Quant: 0.29 ug/mL-FEU (ref 0.00–0.50)

## 2015-07-19 LAB — CBC
HCT: 39.4 % (ref 36.0–46.0)
Hemoglobin: 13.4 g/dL (ref 12.0–15.0)
MCH: 29 pg (ref 26.0–34.0)
MCHC: 34 g/dL (ref 30.0–36.0)
MCV: 85.3 fL (ref 78.0–100.0)
Platelets: 307 10*3/uL (ref 150–400)
RBC: 4.62 MIL/uL (ref 3.87–5.11)
RDW: 14.5 % (ref 11.5–15.5)
WBC: 10 10*3/uL (ref 4.0–10.5)

## 2015-07-19 LAB — TROPONIN I: Troponin I: <0.03 ng/mL (ref <0.031)

## 2015-07-19 MED ORDER — FAMOTIDINE IN NACL 20-0.9 MG/50ML-% IV SOLN
20.0000 mg | Freq: Once | INTRAVENOUS | Status: AC
  Administered 2015-07-19: 20 mg via INTRAVENOUS
  Filled 2015-07-19: qty 50

## 2015-07-19 MED ORDER — SODIUM CHLORIDE 0.9 % IV SOLN
INTRAVENOUS | Status: DC
  Administered 2015-07-19: 16:00:00 via INTRAVENOUS

## 2015-07-19 MED ORDER — IPRATROPIUM-ALBUTEROL 0.5-2.5 (3) MG/3ML IN SOLN
3.0000 mL | Freq: Once | RESPIRATORY_TRACT | Status: AC
  Administered 2015-07-19: 3 mL via RESPIRATORY_TRACT
  Filled 2015-07-19: qty 3

## 2015-07-19 MED ORDER — FAMOTIDINE 20 MG PO TABS: 20.0000 mg | ORAL_TABLET | Freq: Two times a day (BID) | ORAL | Status: DC

## 2015-07-19 MED ORDER — ONDANSETRON HCL 4 MG/2ML IJ SOLN
4.0000 mg | Freq: Once | INTRAMUSCULAR | Status: AC
Start: 2015-07-19 — End: 2015-07-19
  Administered 2015-07-19: 4 mg via INTRAVENOUS
  Filled 2015-07-19: qty 2

## 2015-07-19 MED ORDER — DIPHENHYDRAMINE HCL 25 MG PO TABS: 25.0000 mg | ORAL_TABLET | Freq: Four times a day (QID) | ORAL | Status: DC

## 2015-07-19 MED ORDER — PREDNISONE 10 MG PO TABS
40.0000 mg | ORAL_TABLET | Freq: Every day | ORAL | Status: DC
Start: 2015-07-19 — End: 2015-07-23

## 2015-07-19 MED ORDER — HYDROMORPHONE HCL 1 MG/ML IJ SOLN
1.0000 mg | Freq: Once | INTRAMUSCULAR | Status: AC
  Administered 2015-07-19: 1 mg via INTRAVENOUS
  Filled 2015-07-19: qty 1

## 2015-07-19 MED ORDER — SODIUM CHLORIDE 0.9 % IV BOLUS (SEPSIS)
500.0000 mL | Freq: Once | INTRAVENOUS | Status: AC
  Administered 2015-07-19: 500 mL via INTRAVENOUS

## 2015-07-19 MED ORDER — METHYLPREDNISOLONE SODIUM SUCC 125 MG IJ SOLR
125.0000 mg | Freq: Once | INTRAMUSCULAR | Status: AC
  Administered 2015-07-19: 125 mg via INTRAVENOUS
  Filled 2015-07-19: qty 2

## 2015-07-19 MED ORDER — DIPHENHYDRAMINE HCL 50 MG/ML IJ SOLN
25.0000 mg | Freq: Once | INTRAMUSCULAR | Status: AC
  Administered 2015-07-19: 25 mg via INTRAVENOUS
  Filled 2015-07-19: qty 1

## 2015-07-19 NOTE — ED Notes (Signed)
Patient shaking arms and head. While doing this patient states "i cant stop shaking sometimes, im not cold, i just shake"

## 2015-07-19 NOTE — Discharge Instructions (Signed)
Take the prednisone as directed for the next 5 days. Take the Pepcid for the next 7 days. Take the Benadryl 25 mg every 6 hours for the next 2 days. Return for any new or worse symptoms. Stop the new medication for the scabies. Make an appointment to follow-up with your doctor. Also recommended she continue usual albuterol inhaler 2 puffs every 6 hours for the next 7 days. Workup for the chest pain without any acute findings.

## 2015-07-19 NOTE — ED Notes (Signed)
At completion of triage pt informed nurse that she was told she had scabies yesterday and was given medication for this.

## 2015-07-19 NOTE — ED Provider Notes (Signed)
CSN: [id number]     Arrival date & time 07/19/15  1515 History   First MD Initiated Contact with Patient 07/19/15 1525     Chief Complaint  Patient presents with  . Chest Pain     (Consider location/radiation/quality/duration/timing/severity/associated sxs/prior Treatment) Patient is a 50 y.o. female presenting with chest pain. The history is provided by the patient.  Chest Pain Associated symptoms: back pain, nausea and shortness of breath   Associated symptoms: no abdominal pain, no fever, no headache and not vomiting    patient presenting with 2 complaints one is anterior chest pain radiating to the back started at 10:00. Patient also started with a red rash on her arms that itched and not tightness in her throat. Patient on a new medication for the oral treatment of scabies. Because she is allergic to the permethrin cream. She started this medication yesterday took 2 doses so far. Patient does admit to shortness of breath but denies any wheezing. Denies any lip or tongue swelling. Patient has a history of chronic low back pain which is present but also now has chest back pain. Pain is 10 out of 10 and is severe.  Past Medical History  Diagnosis Date  . Polycystic ovarian syndrome   . Colitis due to Clostridium difficile 2001  . Hepatic hemangioma   . Anxiety   . IBS (irritable bowel syndrome)   . History of Salmonella gastroenteritis   . GERD (gastroesophageal reflux disease)   . Rectal cancer ([place])     a. Followed by Dr. Gala Romney, dx 2000-2001. Tumor removed from rectal.  . Bipolar 1 disorder ([place])   . Migraine headache   . Asthma   . Arthritis   . Hypertension     a. Improved without intervention.  . Recurrent sinusitis     +allergic rhinitis  . Tobacco dependence   . Hyperlipidemia     a. Noted 02/2014.  . Morbid obesity ([place])   . Microscopic hematuria     Intermittent (no w/u done yet, as of 03/2014)  . Pruritic dermatitis 2015/2016    +Scabies prep at [facility] 07/2014. (Primarily pruritic skin, but eventually a subtle rash as well)  . Iron deficiency anemia 05/2014    per pt it is not due to vaginal blood loss; hemoccults sent to pt in mail 413/16.  . Vitamin D deficiency   . Lumbar spondylosis     mild, mainly focused at L4-5 and L5-S1.   Past Surgical History  Procedure Laterality Date  . Cholecystectomy    . Colonoscopy  05/05/2007    adenoma  . Situ removed  age 51    High-grade rectal adenoma removed from rectum   . Dilation and curettage of uterus      for vaginal bleeding  . Cardiovascular stress test  03/24/14    Lexscan MIBI: mild anterior ischemia?  Cardiac CT angiogram recommended/done.  . Transthoracic echocardiogram  03/23/14    Normal  . Left heart catheterization with coronary angiogram N/A 03/31/2014    Normal coronaries, EF 0000000, diastolic dysfunction.  Procedure: LEFT HEART CATHETERIZATION WITH CORONARY ANGIOGRAM;  Surgeon: Sinclair Grooms, MD;  Location: [facility] CATH LAB;  Service: Cardiovascular;  Laterality: N/A;  . Coronary ct angio  03/29/14    Two vessel dz/moderate stenosis of mid LAD and proximal RCA; cath recommended.   Family History  Problem Relation Age of Onset  . Colon cancer Father 12  . Cancer Father   . Diverticulitis Mother   .  Heart disease Mother   . Hypertension Mother   . Hyperlipidemia Mother   . Mental illness Mother   . Diabetes Mother   . Heart attack Mother   . Stroke Neg Hx    Social History  Substance Use Topics  . Smoking status: Current Every Day Smoker -- 0.50 packs/day for 18 years    Types: Cigarettes  . Smokeless tobacco: Never Used     Comment: 25 years  . Alcohol Use: No   OB History    Gravida Para Term Preterm AB TAB SAB Ectopic Multiple Living   4 1 1  3  3   1      Review of Systems  Constitutional: Negative for fever.  HENT: Negative for congestion.   Eyes: Negative for visual disturbance.  Respiratory: Positive for shortness of breath and wheezing.    Cardiovascular: Positive for chest pain.  Gastrointestinal: Positive for nausea. Negative for vomiting, abdominal pain and diarrhea.  Genitourinary: Negative for dysuria.  Musculoskeletal: Positive for back pain. Negative for neck pain.  Skin: Positive for rash.  Neurological: Positive for tremors. Negative for headaches.  Hematological: Does not bruise/bleed easily.  Psychiatric/Behavioral: Negative for confusion.      Allergies  Contrast media; Shellfish allergy; Codeine; Betadine; Latex; Lipitor; Permethrin; and Tape  Home Medications   Prior to Admission medications   Medication Sig Start Date End Date Taking? Authorizing Provider  albuterol (PROVENTIL) (2.5 MG/3ML) 0.083% nebulizer solution Take 3 mLs (2.5 mg total) by nebulization every 6 (six) hours as needed for wheezing or shortness of breath. 10/22/14  Yes Tammy Triplett, PA-C  albuterol (VENTOLIN HFA) 108 (90 Base) MCG/ACT inhaler Inhale 2 puffs into the lungs every 6 (six) hours as needed for wheezing or shortness of breath. 05/04/15  Yes Tammi Sou, MD  aspirin 81 MG chewable tablet Chew 1 tablet (81 mg total) by mouth daily. 09/06/12  Yes Ripley Fraise, MD  busPIRone (BUSPAR) 15 MG tablet Take 30 mg by mouth at bedtime.    Yes Historical Provider, MD  Cetirizine HCl (ZYRTEC ALLERGY) 10 MG CAPS Take 1 capsule (10 mg total) by mouth daily. 12/17/14  Yes Tammi Sou, MD  clonazePAM (KLONOPIN) 0.5 MG tablet TAKE ONE TABLET BY MOUTH DURING THE DAY AS NEEDED THEN TAKE TWO TABLETS DAILY AT BEDTIME 04/28/15  Yes Tammi Sou, MD  cyclobenzaprine (FLEXERIL) 10 MG tablet Take 1 tablet (10 mg total) by mouth every 6 (six) hours as needed (spasm related to headaches). 06/03/15  Yes Carole Civil, MD  fluticasone (FLONASE) 50 MCG/ACT nasal spray Place 2 sprays into both nostrils daily. 08/24/14  Yes Kristen N Ward, DO  HYDROcodone-acetaminophen (NORCO/VICODIN) 5-325 MG tablet Take 1-2 tablets by mouth every 6 (six) hours as  needed. 07/04/15  Yes Tammi Sou, MD  hydrOXYzine (ATARAX/VISTARIL) 25 MG tablet Take 25 mg by mouth 3 (three) times daily.    Yes Historical Provider, MD  ivermectin (STROMECTOL) 3 MG TABS tablet 5 tabs po x 1 dose; repeat dose in 7-14 days Patient taking differently: Take 15 mg by mouth once. 5 tabs x 1 dose; repeat dose in 7-14 days 07/18/15  Yes Tammi Sou, MD  lamoTRIgine (LAMICTAL) 200 MG tablet Take 200 mg by mouth at bedtime.   Yes Historical Provider, MD  nitroGLYCERIN (NITROSTAT) 0.4 MG SL tablet Place 1 tablet (0.4 mg total) under the tongue every 5 (five) minutes as needed for chest pain. 03/25/14  Yes Eugenie Filler, MD  pantoprazole (  PROTONIX) 40 MG tablet Take 1 tablet (40 mg total) by mouth daily. 06/13/15  Yes Tammi Sou, MD  promethazine (PHENERGAN) 25 MG tablet Take 1 tablet (25 mg total) by mouth every 6 (six) hours as needed for nausea or vomiting. 07/04/15  Yes Tammi Sou, MD  diphenhydrAMINE (BENADRYL) 25 MG tablet Take 1 tablet (25 mg total) by mouth every 6 (six) hours. 07/19/15   Fredia Sorrow, MD  famotidine (PEPCID) 20 MG tablet Take 1 tablet (20 mg total) by mouth 2 (two) times daily. 07/19/15   Fredia Sorrow, MD  predniSONE (DELTASONE) 10 MG tablet Take 4 tablets (40 mg total) by mouth daily. 07/19/15   Fredia Sorrow, MD   BP 111/57 mmHg  Pulse 98  Temp(Src) 98 F (36.7 C) (Oral)  Resp 22  Ht 5' (1.524 m)  Wt 105.688 kg  BMI 45.50 kg/m2  SpO2 96%  LMP 07/12/2015 Physical Exam  Constitutional: She is oriented to person, place, and time. She appears well-developed and well-nourished. No distress.  HENT:  Head: Normocephalic and atraumatic.  Mouth/Throat: Oropharynx is clear and moist.  Eyes: Conjunctivae and EOM are normal. Pupils are equal, round, and reactive to light.  Neck: Normal range of motion. Neck supple. No tracheal deviation present.  Cardiovascular: Normal rate, regular rhythm and normal heart sounds.   No murmur  heard. Pulmonary/Chest: No stridor. She is in respiratory distress. She has wheezes. She exhibits no tenderness.  Abdominal: Soft. Bowel sounds are normal. There is no tenderness.  Musculoskeletal: She exhibits no edema.  Neurological: She is alert and oriented to person, place, and time. No cranial nerve deficit. She exhibits abnormal muscle tone. Coordination normal.  Skin: Skin is warm. Rash noted. There is erythema.  Erythema to the face upper chest hives to the bilateral arms. Back without any hives or rash.  Nursing note and vitals reviewed.   ED Course  Procedures (including critical care time) Labs Review Labs Reviewed  BASIC METABOLIC PANEL - Abnormal; Notable for the following:    CO2 21 (*)    Glucose, Bld 155 (*)    All other components within normal limits  CBC  TROPONIN I  D-DIMER, QUANTITATIVE (NOT AT [facility])   Results for orders placed or performed during the hospital encounter of 0000000  Basic metabolic panel  Result Value Ref Range   Sodium 141 135 - 145 mmol/L   Potassium 3.6 3.5 - 5.1 mmol/L   Chloride 109 101 - 111 mmol/L   CO2 21 (L) 22 - 32 mmol/L   Glucose, Bld 155 (H) 65 - 99 mg/dL   BUN 12 6 - 20 mg/dL   Creatinine, Ser 0.69 0.44 - 1.00 mg/dL   Calcium 8.9 8.9 - 10.3 mg/dL   GFR calc non Af Amer >60 >60 mL/min   GFR calc Af Amer >60 >60 mL/min   Anion gap 11 5 - 15  CBC  Result Value Ref Range   WBC 10.0 4.0 - 10.5 K/uL   RBC 4.62 3.87 - 5.11 MIL/uL   Hemoglobin 13.4 12.0 - 15.0 g/dL   HCT 39.4 36.0 - 46.0 %   MCV 85.3 78.0 - 100.0 fL   MCH 29.0 26.0 - 34.0 pg   MCHC 34.0 30.0 - 36.0 g/dL   RDW 14.5 11.5 - 15.5 %   Platelets 307 150 - 400 K/uL  Troponin I  Result Value Ref Range   Troponin I <0.03 <0.031 ng/mL  D-dimer, quantitative (not at [facility])  Result Value  Ref Range   D-Dimer, Quant 0.29 0.00 - 0.50 ug/mL-FEU     Imaging Review Dg Chest Port 1 View  07/19/2015  CLINICAL DATA:  Chest pain, cough and shortness of breath since this  morning. EXAM: PORTABLE CHEST 1 VIEW COMPARISON:  October 21, 2014 FINDINGS: The heart size and mediastinal contours are within normal limits. There is no focal infiltrate, pulmonary edema, or pleural effusion. The visualized skeletal structures are unremarkable. IMPRESSION: No active cardiopulmonary disease. Electronically Signed   By: Abelardo Diesel M.D.   On: 07/19/2015 16:17   I have personally reviewed and evaluated these images and lab results as part of my medical decision-making.   EKG Interpretation   Date/Time:  Tuesday July 19 2015 15:20:48 EDT Ventricular Rate:  112 PR Interval:  138 QRS Duration: 84 QT Interval:  342 QTC Calculation: 466 R Axis:   -30 Text Interpretation:  Sinus tachycardia Left axis deviation Cannot rule  out Anterior infarct , age undetermined Abnormal ECG No significant change  since last tracing Confirmed by Sahaana Weitman  MD, Morton Simson [phone]) on 07/19/2015  3:27:26 PM      MDM   Final diagnoses:  Allergic reaction, initial encounter  Chest pain, unspecified chest pain type   Patient presents with 2 complaints one is anterior chest pain radiating to the upper back. Patient has a history of chronic low back pain is on pain medication for that. Patient had a history of chest pain in the past and had a completely normal cardiac cath in early 2016. Patient also was recently treated with oral pain medicine for scabies because she is allergic to permethrin. The medication that she was treated with was to include 3 doses and she's taken 2 of them. Just started this yesterday. It is Ivermectin.  At the time that the chest pain started she also broke out with a rash that was achieved on her face upper chest and both arms and felt the tightness in her throat. No lip swelling no tongue swelling. Also had increased shortness of breath. Faint wheezing here upon arrival resolved with albuterol Atrovent treatment. Patient also treated for allergic reaction with Solu-Medrol Pepcid  and Benadryl redness on the skin and the rash on the skin with significant improvement. Patient also feels much better.  Workup for the chest pain without any acute findings. EKG showed a sinus tach with heart rate of around 110-112. Cardiac monitor without any arrhythmias. Troponin was negative labs without significant abnormalities. D-dimer also negative for pulmonary embolus.  Patient stable for discharge home. Patient will be continued on Benadryl Pepcid and prednisone. Patient will be obstructed stop the new medication.     Fredia Sorrow, MD 07/19/15 1757

## 2015-07-19 NOTE — ED Notes (Signed)
Pt comes in with chest pain starting around 1000. Pt states she was lying down when this happened. Pt anxious upon triage with shortness of breath noted.

## 2015-07-19 NOTE — ED Notes (Signed)
Patient coughing and spitting in room at this time stating "im nauseated"

## 2015-07-19 NOTE — ED Notes (Signed)
Patient verbalizes understanding of discharge instructions, prescriptions, home care and follow up care. Patient out of department at this time with family. 

## 2015-07-22 ENCOUNTER — Emergency Department (HOSPITAL_COMMUNITY): Payer: Commercial Managed Care - PPO

## 2015-07-22 ENCOUNTER — Inpatient Hospital Stay (HOSPITAL_COMMUNITY)
Admission: EM | Admit: 2015-07-22 | Discharge: 2015-07-23 | DRG: 864 | Disposition: A | Discharge status: Home / Self Care | Payer: Commercial Managed Care - PPO | Attending: Internal Medicine | Admitting: Internal Medicine

## 2015-07-22 ENCOUNTER — Encounter (HOSPITAL_COMMUNITY): Payer: Self-pay

## 2015-07-22 DIAGNOSIS — T50905A Adverse effect of unspecified drugs, medicaments and biological substances, initial encounter: Secondary | ICD-10-CM | POA: Diagnosis present

## 2015-07-22 DIAGNOSIS — I10 Essential (primary) hypertension: Secondary | ICD-10-CM | POA: Diagnosis present

## 2015-07-22 DIAGNOSIS — E669 Obesity, unspecified: Secondary | ICD-10-CM | POA: Diagnosis present

## 2015-07-22 DIAGNOSIS — F319 Bipolar disorder, unspecified: Secondary | ICD-10-CM | POA: Diagnosis present

## 2015-07-22 DIAGNOSIS — E872 Acidosis, unspecified: Secondary | ICD-10-CM | POA: Diagnosis present

## 2015-07-22 DIAGNOSIS — T374X5A Adverse effect of anthelminthics, initial encounter: Secondary | ICD-10-CM | POA: Diagnosis present

## 2015-07-22 DIAGNOSIS — Z833 Family history of diabetes mellitus: Secondary | ICD-10-CM | POA: Diagnosis not present

## 2015-07-22 DIAGNOSIS — E785 Hyperlipidemia, unspecified: Secondary | ICD-10-CM | POA: Diagnosis present

## 2015-07-22 DIAGNOSIS — T380X5A Adverse effect of glucocorticoids and synthetic analogues, initial encounter: Secondary | ICD-10-CM | POA: Diagnosis present

## 2015-07-22 DIAGNOSIS — E860 Dehydration: Secondary | ICD-10-CM | POA: Diagnosis present

## 2015-07-22 DIAGNOSIS — Z7951 Long term (current) use of inhaled steroids: Secondary | ICD-10-CM | POA: Diagnosis not present

## 2015-07-22 DIAGNOSIS — Z818 Family history of other mental and behavioral disorders: Secondary | ICD-10-CM

## 2015-07-22 DIAGNOSIS — T887XXA Unspecified adverse effect of drug or medicament, initial encounter: Secondary | ICD-10-CM | POA: Diagnosis not present

## 2015-07-22 DIAGNOSIS — Z85048 Personal history of other malignant neoplasm of rectum, rectosigmoid junction, and anus: Secondary | ICD-10-CM | POA: Diagnosis not present

## 2015-07-22 DIAGNOSIS — D649 Anemia, unspecified: Secondary | ICD-10-CM | POA: Diagnosis present

## 2015-07-22 DIAGNOSIS — R509 Fever, unspecified: Secondary | ICD-10-CM | POA: Diagnosis present

## 2015-07-22 DIAGNOSIS — Z7982 Long term (current) use of aspirin: Secondary | ICD-10-CM | POA: Diagnosis not present

## 2015-07-22 DIAGNOSIS — Z8249 Family history of ischemic heart disease and other diseases of the circulatory system: Secondary | ICD-10-CM

## 2015-07-22 DIAGNOSIS — F17210 Nicotine dependence, cigarettes, uncomplicated: Secondary | ICD-10-CM | POA: Diagnosis present

## 2015-07-22 DIAGNOSIS — E876 Hypokalemia: Secondary | ICD-10-CM | POA: Diagnosis present

## 2015-07-22 DIAGNOSIS — R502 Drug induced fever: Secondary | ICD-10-CM | POA: Diagnosis present

## 2015-07-22 DIAGNOSIS — R112 Nausea with vomiting, unspecified: Secondary | ICD-10-CM | POA: Diagnosis present

## 2015-07-22 DIAGNOSIS — B86 Scabies: Secondary | ICD-10-CM | POA: Diagnosis present

## 2015-07-22 DIAGNOSIS — Z800 Family history of malignant neoplasm of digestive organs: Secondary | ICD-10-CM | POA: Diagnosis not present

## 2015-07-22 DIAGNOSIS — K219 Gastro-esophageal reflux disease without esophagitis: Secondary | ICD-10-CM | POA: Diagnosis present

## 2015-07-22 DIAGNOSIS — D72829 Elevated white blood cell count, unspecified: Secondary | ICD-10-CM

## 2015-07-22 DIAGNOSIS — D72828 Other elevated white blood cell count: Secondary | ICD-10-CM | POA: Diagnosis present

## 2015-07-22 DIAGNOSIS — J45909 Unspecified asthma, uncomplicated: Secondary | ICD-10-CM | POA: Diagnosis present

## 2015-07-22 DIAGNOSIS — Z8619 Personal history of other infectious and parasitic diseases: Secondary | ICD-10-CM | POA: Diagnosis not present

## 2015-07-22 DIAGNOSIS — M549 Dorsalgia, unspecified: Secondary | ICD-10-CM | POA: Diagnosis not present

## 2015-07-22 DIAGNOSIS — G8929 Other chronic pain: Secondary | ICD-10-CM | POA: Diagnosis present

## 2015-07-22 DIAGNOSIS — M545 Low back pain: Secondary | ICD-10-CM | POA: Diagnosis present

## 2015-07-22 DIAGNOSIS — R1114 Bilious vomiting: Secondary | ICD-10-CM

## 2015-07-22 LAB — TROPONIN I: Troponin I: 0.03 ng/mL (ref <0.031)

## 2015-07-22 LAB — URINALYSIS, ROUTINE W REFLEX MICROSCOPIC
Glucose, UA: NEGATIVE mg/dL
Hgb urine dipstick: NEGATIVE
Leukocytes, UA: NEGATIVE
Nitrite: NEGATIVE
Protein, ur: 30 mg/dL — AB
Specific Gravity, Urine: 1.025 (ref 1.005–1.030)
pH: 5.5 (ref 5.0–8.0)

## 2015-07-22 LAB — LACTIC ACID, PLASMA
Lactic Acid, Venous: 2.3 mmol/L (ref 0.5–2.0)
Lactic Acid, Venous: 3 mmol/L (ref 0.5–2.0)
Lactic Acid, Venous: 1.9 mmol/L (ref 0.5–2.0)

## 2015-07-22 LAB — COMPREHENSIVE METABOLIC PANEL
ALT: 11 U/L — ABNORMAL LOW (ref 14–54)
AST: 12 U/L — ABNORMAL LOW (ref 15–41)
Albumin: 3.7 g/dL (ref 3.5–5.0)
Alkaline Phosphatase: 50 U/L (ref 38–126)
Anion gap: 13 (ref 5–15)
BUN: 15 mg/dL (ref 6–20)
CO2: 23 mmol/L (ref 22–32)
Calcium: 8.8 mg/dL — ABNORMAL LOW (ref 8.9–10.3)
Chloride: 105 mmol/L (ref 101–111)
Creatinine, Ser: 0.9 mg/dL (ref 0.44–1.00)
GFR calc Af Amer: >60 mL/min (ref >60)
GFR calc non Af Amer: >60 mL/min (ref >60)
Glucose, Bld: 136 mg/dL — ABNORMAL HIGH (ref 65–99)
Potassium: 2.8 mmol/L — ABNORMAL LOW (ref 3.5–5.1)
Sodium: 141 mmol/L (ref 135–145)
Total Bilirubin: 0.6 mg/dL (ref 0.3–1.2)
Total Protein: 6.8 g/dL (ref 6.5–8.1)

## 2015-07-22 LAB — CBC
HCT: 36.8 % (ref 36.0–46.0)
Hemoglobin: 12.4 g/dL (ref 12.0–15.0)
MCH: 29.1 pg (ref 26.0–34.0)
MCHC: 33.7 g/dL (ref 30.0–36.0)
MCV: 86.4 fL (ref 78.0–100.0)
Platelets: 326 10*3/uL (ref 150–400)
RBC: 4.26 MIL/uL (ref 3.87–5.11)
RDW: 14.3 % (ref 11.5–15.5)
WBC: 14.6 10*3/uL — ABNORMAL HIGH (ref 4.0–10.5)

## 2015-07-22 LAB — BRAIN NATRIURETIC PEPTIDE: B Natriuretic Peptide: 1 pg/mL (ref 0.0–100.0)

## 2015-07-22 LAB — URINE MICROSCOPIC-ADD ON: Bacteria, UA: NONE SEEN

## 2015-07-22 LAB — D-DIMER, QUANTITATIVE: D-Dimer, Quant: 0.52 ug/mL-FEU — ABNORMAL HIGH (ref 0.00–0.50)

## 2015-07-22 MED ORDER — PROCHLORPERAZINE EDISYLATE 5 MG/ML IJ SOLN: 10.0000 mg | INTRAMUSCULAR | Status: DC | PRN

## 2015-07-22 MED ORDER — CYCLOBENZAPRINE HCL 10 MG PO TABS
10.0000 mg | ORAL_TABLET | Freq: Three times a day (TID) | ORAL | Status: DC | PRN
  Administered 2015-07-23: 10 mg via ORAL
  Filled 2015-07-22: qty 1

## 2015-07-22 MED ORDER — NITROGLYCERIN 0.4 MG SL SUBL: 0.4000 mg | SUBLINGUAL_TABLET | SUBLINGUAL | Status: DC | PRN

## 2015-07-22 MED ORDER — HYDROXYZINE HCL 25 MG PO TABS
25.0000 mg | ORAL_TABLET | Freq: Three times a day (TID) | ORAL | Status: DC
Start: 2015-07-22 — End: 2015-07-23
  Administered 2015-07-22 – 2015-07-23 (×3): 25 mg via ORAL
  Filled 2015-07-22 (×4): qty 1

## 2015-07-22 MED ORDER — HYDROMORPHONE HCL 1 MG/ML IJ SOLN
0.5000 mg | INTRAMUSCULAR | Status: DC | PRN
  Administered 2015-07-23 (×2): 0.5 mg via INTRAVENOUS
  Filled 2015-07-22 (×2): qty 1

## 2015-07-22 MED ORDER — TECHNETIUM TO 99M ALBUMIN AGGREGATED
4.0000 | Freq: Once | INTRAVENOUS | Status: AC | PRN
  Administered 2015-07-22: 4 via INTRAVENOUS

## 2015-07-22 MED ORDER — ALBUTEROL SULFATE (2.5 MG/3ML) 0.083% IN NEBU
2.5000 mg | INHALATION_SOLUTION | Freq: Once | RESPIRATORY_TRACT | Status: AC
  Administered 2015-07-22: 2.5 mg via RESPIRATORY_TRACT
  Filled 2015-07-22: qty 3

## 2015-07-22 MED ORDER — ENOXAPARIN SODIUM 40 MG/0.4ML ~~LOC~~ SOLN
40.0000 mg | SUBCUTANEOUS | Status: DC
  Filled 2015-07-22: qty 0.4

## 2015-07-22 MED ORDER — SODIUM CHLORIDE 0.9 % IV BOLUS (SEPSIS): 500.0000 mL | INTRAVENOUS | Status: AC

## 2015-07-22 MED ORDER — POTASSIUM CHLORIDE 10 MEQ/100ML IV SOLN
10.0000 meq | INTRAVENOUS | Status: AC
Start: 2015-07-22 — End: 2015-07-23
  Administered 2015-07-22 – 2015-07-23 (×5): 10 meq via INTRAVENOUS
  Filled 2015-07-22 (×2): qty 100

## 2015-07-22 MED ORDER — PANTOPRAZOLE SODIUM 40 MG PO TBEC
40.0000 mg | DELAYED_RELEASE_TABLET | Freq: Every day | ORAL | Status: DC
  Administered 2015-07-22 – 2015-07-23 (×2): 40 mg via ORAL
  Filled 2015-07-22 (×2): qty 1

## 2015-07-22 MED ORDER — VANCOMYCIN HCL IN DEXTROSE 1-5 GM/200ML-% IV SOLN: 1000.0000 mg | Freq: Once | INTRAVENOUS | Status: DC

## 2015-07-22 MED ORDER — ACETAMINOPHEN 500 MG PO TABS
1000.0000 mg | ORAL_TABLET | Freq: Once | ORAL | Status: AC
  Administered 2015-07-22: 1000 mg via ORAL
  Filled 2015-07-22: qty 2

## 2015-07-22 MED ORDER — SODIUM CHLORIDE 0.9 % IV BOLUS (SEPSIS)
1000.0000 mL | INTRAVENOUS | Status: AC
  Administered 2015-07-22 (×3): 1000 mL via INTRAVENOUS

## 2015-07-22 MED ORDER — BUSPIRONE HCL 5 MG PO TABS
30.0000 mg | ORAL_TABLET | Freq: Every day | ORAL | Status: DC
  Administered 2015-07-22: 30 mg via ORAL
  Filled 2015-07-22: qty 6

## 2015-07-22 MED ORDER — POTASSIUM CHLORIDE CRYS ER 20 MEQ PO TBCR
40.0000 meq | EXTENDED_RELEASE_TABLET | Freq: Once | ORAL | Status: AC
  Administered 2015-07-22: 40 meq via ORAL
  Filled 2015-07-22: qty 2

## 2015-07-22 MED ORDER — SODIUM CHLORIDE 0.9 % IV SOLN
INTRAVENOUS | Status: DC
  Administered 2015-07-22: 20:00:00 via INTRAVENOUS

## 2015-07-22 MED ORDER — FAMOTIDINE 20 MG PO TABS
20.0000 mg | ORAL_TABLET | Freq: Two times a day (BID) | ORAL | Status: DC
  Administered 2015-07-22 – 2015-07-23 (×2): 20 mg via ORAL
  Filled 2015-07-22 (×2): qty 1

## 2015-07-22 MED ORDER — FLUTICASONE PROPIONATE 50 MCG/ACT NA SUSP
2.0000 | Freq: Every day | NASAL | Status: DC
  Administered 2015-07-23: 2 via NASAL
  Filled 2015-07-22: qty 16

## 2015-07-22 MED ORDER — DIPHENHYDRAMINE HCL 25 MG PO TABS
25.0000 mg | ORAL_TABLET | Freq: Four times a day (QID) | ORAL | Status: DC
  Administered 2015-07-22 – 2015-07-23 (×3): 25 mg via ORAL
  Filled 2015-07-22 (×15): qty 1

## 2015-07-22 MED ORDER — ACETAMINOPHEN 325 MG PO TABS: 650.0000 mg | ORAL_TABLET | Freq: Four times a day (QID) | ORAL | Status: DC | PRN

## 2015-07-22 MED ORDER — VANCOMYCIN HCL IN DEXTROSE 1-5 GM/200ML-% IV SOLN
1000.0000 mg | Freq: Once | INTRAVENOUS | Status: AC
  Administered 2015-07-22: 1000 mg via INTRAVENOUS
  Filled 2015-07-22: qty 200

## 2015-07-22 MED ORDER — ASPIRIN 81 MG PO CHEW
81.0000 mg | CHEWABLE_TABLET | Freq: Every day | ORAL | Status: DC
  Administered 2015-07-22 – 2015-07-23 (×2): 81 mg via ORAL
  Filled 2015-07-22 (×2): qty 1

## 2015-07-22 MED ORDER — LORATADINE 10 MG PO TABS
10.0000 mg | ORAL_TABLET | Freq: Every day | ORAL | Status: DC
  Administered 2015-07-22 – 2015-07-23 (×2): 10 mg via ORAL
  Filled 2015-07-22 (×2): qty 1

## 2015-07-22 MED ORDER — LAMOTRIGINE 100 MG PO TABS
200.0000 mg | ORAL_TABLET | Freq: Every day | ORAL | Status: DC
  Administered 2015-07-22: 200 mg via ORAL
  Filled 2015-07-22: qty 2

## 2015-07-22 MED ORDER — ONDANSETRON HCL 4 MG/2ML IJ SOLN
4.0000 mg | Freq: Once | INTRAMUSCULAR | Status: AC
  Administered 2015-07-22: 4 mg via INTRAVENOUS
  Filled 2015-07-22: qty 2

## 2015-07-22 MED ORDER — PROCHLORPERAZINE EDISYLATE 5 MG/ML IJ SOLN
5.0000 mg | Freq: Once | INTRAMUSCULAR | Status: AC
  Administered 2015-07-22: 5 mg via INTRAVENOUS
  Filled 2015-07-22: qty 2

## 2015-07-22 MED ORDER — ACETAMINOPHEN 650 MG RE SUPP: 650.0000 mg | Freq: Four times a day (QID) | RECTAL | Status: DC | PRN

## 2015-07-22 MED ORDER — TECHNETIUM TC 99M DIETHYLENETRIAME-PENTAACETIC ACID
30.0000 | Freq: Once | INTRAVENOUS | Status: AC | PRN
  Administered 2015-07-22: 30 via INTRAVENOUS

## 2015-07-22 MED ORDER — ALBUTEROL SULFATE (2.5 MG/3ML) 0.083% IN NEBU: 2.5000 mg | INHALATION_SOLUTION | Freq: Four times a day (QID) | RESPIRATORY_TRACT | Status: DC | PRN

## 2015-07-22 MED ORDER — CLONAZEPAM 0.5 MG PO TABS
0.5000 mg | ORAL_TABLET | Freq: Two times a day (BID) | ORAL | Status: DC | PRN
  Administered 2015-07-22: 0.5 mg via ORAL
  Filled 2015-07-22: qty 1

## 2015-07-22 MED ORDER — HYDROCODONE-ACETAMINOPHEN 5-325 MG PO TABS
1.0000 | ORAL_TABLET | Freq: Four times a day (QID) | ORAL | Status: DC | PRN
  Administered 2015-07-22: 1 via ORAL
  Administered 2015-07-23: 2 via ORAL
  Administered 2015-07-23 (×2): 1 via ORAL
  Filled 2015-07-22 (×2): qty 1
  Filled 2015-07-22: qty 2
  Filled 2015-07-22: qty 1

## 2015-07-22 MED ORDER — MORPHINE SULFATE (PF) 4 MG/ML IV SOLN
4.0000 mg | Freq: Once | INTRAVENOUS | Status: AC
  Administered 2015-07-22: 4 mg via INTRAVENOUS
  Filled 2015-07-22: qty 1

## 2015-07-22 MED ORDER — ALBUTEROL SULFATE (2.5 MG/3ML) 0.083% IN NEBU: 3.0000 mL | INHALATION_SOLUTION | Freq: Four times a day (QID) | RESPIRATORY_TRACT | Status: DC | PRN

## 2015-07-22 MED ORDER — PIPERACILLIN-TAZOBACTAM 3.375 G IVPB 30 MIN
3.3750 g | Freq: Once | INTRAVENOUS | Status: AC
  Administered 2015-07-22: 3.375 g via INTRAVENOUS
  Filled 2015-07-22: qty 50

## 2015-07-22 NOTE — ED Notes (Signed)
Pt's mom arrived and is at bedside.

## 2015-07-22 NOTE — ED Notes (Signed)
Respiratory at bedside.

## 2015-07-22 NOTE — ED Notes (Signed)
Phlebotomy at bedside for blood cultures. Will start antibiotics as soon as cultures are obtained.

## 2015-07-22 NOTE — ED Notes (Signed)
Pt reports was here recently and given a prescription for PO med for treatment of scabies.  Pt reports 2 days ago started vomiting and having back spasms.

## 2015-07-22 NOTE — ED Notes (Signed)
Phlebotomy at bedside.

## 2015-07-22 NOTE — ED Notes (Signed)
Phlebotomy at bedside for lactic acid.

## 2015-07-22 NOTE — ED Notes (Signed)
Contact precautions initiated due to scabies.

## 2015-07-22 NOTE — ED Notes (Signed)
Pt given ice chips with MD approval.  

## 2015-07-22 NOTE — ED Provider Notes (Signed)
CSN: [id number]     Arrival date & time 07/22/15  0908 History   First MD Initiated Contact with Patient 07/22/15 [phone]     Chief Complaint  Patient presents with  . Back Pain     (Consider location/radiation/quality/duration/timing/severity/associated sxs/prior Treatment) HPI Comments: Patient is a 50 year old female who presents to the emergency department with a complaint of back pain, and difficulty with breathing.  The patient has a history of chronic back pain, osteoarthritis involving the back. There is a history of anxiety, gastroesophageal reflux disease, bipolar disorder, migraine headaches, arthritis, hypertension, diastolic dysfunction, tobacco dependence, and morbid obesity.  The patient states that on April 25 she had a reaction to a medication she was given for scabies. She states that she started having problems with vomiting, and as result of that she started having spasms of her back. She states that the spasms are getting progressively worse. They are aggravated more with deep breathing, coughing, sneezing, and with some movement. Nothing seems to really help them. The patient also complains of increasing difficulty with breathing. She states she feels like she just can't get a good deep breath. She has had some nausea, but no recent vomiting in the last 24 hours. His been no hemoptysis reported. No hematochezia appreciated according to the patient. Patient denies urine symptoms. She's not had any recent injury or trauma to the back. She has not had high fever reported. She presents now for assistance with these issues.  Patient is a 50 y.o. female presenting with back pain. The history is provided by the patient.  Back Pain Location:  Lumbar spine Associated symptoms: headaches   Associated symptoms: no dysuria and no fever     Past Medical History  Diagnosis Date  . Polycystic ovarian syndrome   . Colitis due to Clostridium difficile 2001  . Hepatic hemangioma   .  Anxiety   . IBS (irritable bowel syndrome)   . History of Salmonella gastroenteritis   . GERD (gastroesophageal reflux disease)   . Rectal cancer ([place])     a. Followed by Dr. Gala Romney, dx 2000-2001. Tumor removed from rectal.  . Bipolar 1 disorder ([place])   . Migraine headache   . Asthma   . Arthritis   . Hypertension     a. Improved without intervention.  . Recurrent sinusitis     +allergic rhinitis  . Tobacco dependence   . Hyperlipidemia     a. Noted 02/2014.  . Morbid obesity ([place])   . Microscopic hematuria     Intermittent (no w/u done yet, as of 03/2014)  . Pruritic dermatitis 2015/2016    +Scabies prep at [facility] 07/2014. (Primarily pruritic skin, but eventually a subtle rash as well)  . Iron deficiency anemia 05/2014    per pt it is not due to vaginal blood loss; hemoccults sent to pt in mail 413/16.  . Vitamin D deficiency   . Lumbar spondylosis     mild, mainly focused at L4-5 and L5-S1.   Past Surgical History  Procedure Laterality Date  . Cholecystectomy    . Colonoscopy  05/05/2007    adenoma  . Situ removed  age 45    High-grade rectal adenoma removed from rectum   . Dilation and curettage of uterus      for vaginal bleeding  . Cardiovascular stress test  03/24/14    Lexscan MIBI: mild anterior ischemia?  Cardiac CT angiogram recommended/done.  . Transthoracic echocardiogram  03/23/14    Normal  .  Left heart catheterization with coronary angiogram N/A 03/31/2014    Normal coronaries, EF 0000000, diastolic dysfunction.  Procedure: LEFT HEART CATHETERIZATION WITH CORONARY ANGIOGRAM;  Surgeon: Sinclair Grooms, MD;  Location: [facility] CATH LAB;  Service: Cardiovascular;  Laterality: N/A;  . Coronary ct angio  03/29/14    Two vessel dz/moderate stenosis of mid LAD and proximal RCA; cath recommended.   Family History  Problem Relation Age of Onset  . Colon cancer Father 5  . Cancer Father   . Diverticulitis Mother   . Heart disease Mother   . Hypertension Mother    . Hyperlipidemia Mother   . Mental illness Mother   . Diabetes Mother   . Heart attack Mother   . Stroke Neg Hx    Social History  Substance Use Topics  . Smoking status: Current Every Day Smoker -- 0.50 packs/day for 18 years    Types: Cigarettes  . Smokeless tobacco: Never Used     Comment: 25 years  . Alcohol Use: No   OB History    Gravida Para Term Preterm AB TAB SAB Ectopic Multiple Living   4 1 1  3  3   1      Review of Systems  Constitutional: Positive for activity change. Negative for fever.  Respiratory: Positive for cough and shortness of breath.   Gastrointestinal: Positive for nausea and vomiting. Negative for blood in stool.  Genitourinary: Negative for dysuria.  Musculoskeletal: Positive for back pain.  Neurological: Positive for headaches.  Psychiatric/Behavioral: The patient is nervous/anxious.   All other systems reviewed and are negative.     Allergies  Contrast media; Shellfish allergy; Codeine; Betadine; Latex; Lipitor; Permethrin; and Tape  Home Medications   Prior to Admission medications   Medication Sig Start Date End Date Taking? Authorizing Provider  albuterol (PROVENTIL) (2.5 MG/3ML) 0.083% nebulizer solution Take 3 mLs (2.5 mg total) by nebulization every 6 (six) hours as needed for wheezing or shortness of breath. 10/22/14   Tammy Triplett, PA-C  albuterol (VENTOLIN HFA) 108 (90 Base) MCG/ACT inhaler Inhale 2 puffs into the lungs every 6 (six) hours as needed for wheezing or shortness of breath. 05/04/15   Tammi Sou, MD  aspirin 81 MG chewable tablet Chew 1 tablet (81 mg total) by mouth daily. 09/06/12   Ripley Fraise, MD  busPIRone (BUSPAR) 15 MG tablet Take 30 mg by mouth at bedtime.     Historical Provider, MD  Cetirizine HCl (ZYRTEC ALLERGY) 10 MG CAPS Take 1 capsule (10 mg total) by mouth daily. 12/17/14   Tammi Sou, MD  clonazePAM (KLONOPIN) 0.5 MG tablet TAKE ONE TABLET BY MOUTH DURING THE DAY AS NEEDED THEN TAKE TWO  TABLETS DAILY AT BEDTIME 04/28/15   Tammi Sou, MD  cyclobenzaprine (FLEXERIL) 10 MG tablet Take 1 tablet (10 mg total) by mouth every 6 (six) hours as needed (spasm related to headaches). 06/03/15   Carole Civil, MD  diphenhydrAMINE (BENADRYL) 25 MG tablet Take 1 tablet (25 mg total) by mouth every 6 (six) hours. 07/19/15   Fredia Sorrow, MD  famotidine (PEPCID) 20 MG tablet Take 1 tablet (20 mg total) by mouth 2 (two) times daily. 07/19/15   Fredia Sorrow, MD  fluticasone (FLONASE) 50 MCG/ACT nasal spray Place 2 sprays into both nostrils daily. 08/24/14   Kristen N Ward, DO  HYDROcodone-acetaminophen (NORCO/VICODIN) 5-325 MG tablet Take 1-2 tablets by mouth every 6 (six) hours as needed. 07/04/15   Tammi Sou, MD  hydrOXYzine (ATARAX/VISTARIL) 25 MG tablet Take 25 mg by mouth 3 (three) times daily.     Historical Provider, MD  ivermectin (STROMECTOL) 3 MG TABS tablet 5 tabs po x 1 dose; repeat dose in 7-14 days Patient taking differently: Take 15 mg by mouth once. 5 tabs x 1 dose; repeat dose in 7-14 days 07/18/15   Tammi Sou, MD  lamoTRIgine (LAMICTAL) 200 MG tablet Take 200 mg by mouth at bedtime.    Historical Provider, MD  nitroGLYCERIN (NITROSTAT) 0.4 MG SL tablet Place 1 tablet (0.4 mg total) under the tongue every 5 (five) minutes as needed for chest pain. 03/25/14   Eugenie Filler, MD  pantoprazole (PROTONIX) 40 MG tablet Take 1 tablet (40 mg total) by mouth daily. 06/13/15   Tammi Sou, MD  predniSONE (DELTASONE) 10 MG tablet Take 4 tablets (40 mg total) by mouth daily. 07/19/15   Fredia Sorrow, MD  promethazine (PHENERGAN) 25 MG tablet Take 1 tablet (25 mg total) by mouth every 6 (six) hours as needed for nausea or vomiting. 07/04/15   Tammi Sou, MD   BP 108/73 mmHg  Pulse 122  Temp(Src) 99 F (37.2 C) (Oral)  Resp 39  SpO2 94%  LMP 07/12/2015 Physical Exam  Constitutional: She is oriented to person, place, and time. She appears well-developed  and well-nourished.  Non-toxic appearance.  HENT:  Head: Normocephalic.  Right Ear: Tympanic membrane and external ear normal.  Left Ear: Tympanic membrane and external ear normal.  Eyes: EOM and lids are normal. Pupils are equal, round, and reactive to light.  Neck: Normal range of motion. Neck supple. Carotid bruit is not present.  Cardiovascular: Regular rhythm, normal heart sounds, intact distal pulses and normal pulses.  Tachycardia present.   Pulmonary/Chest: Tachypnea noted. No respiratory distress. She has wheezes.  Abdominal: Soft. Bowel sounds are normal. There is no tenderness. There is no guarding.  Musculoskeletal:       Lumbar back: She exhibits decreased range of motion and pain. She exhibits no deformity.  Lymphadenopathy:       Head (right side): No submandibular adenopathy present.       Head (left side): No submandibular adenopathy present.    She has no cervical adenopathy.  Neurological: She is alert and oriented to person, place, and time. She has normal strength. No cranial nerve deficit or sensory deficit.  Skin: Skin is warm and dry. Rash noted.  Psychiatric: She has a normal mood and affect. Her speech is normal.  Nursing note and vitals reviewed.   ED Course  Patient seen with me in case discussed by Dr. Roderic Palau  Procedures (including critical care time) Labs Review Labs Reviewed  COMPREHENSIVE METABOLIC PANEL  CBC  D-DIMER, QUANTITATIVE (NOT AT [facility])  TROPONIN I  BRAIN NATRIURETIC PEPTIDE  URINALYSIS, ROUTINE W REFLEX MICROSCOPIC (NOT AT [facility])    Imaging Review Dg Chest Port 1 View  07/22/2015  CLINICAL DATA:  Shortness of breath.  Back pain.  Vomiting. EXAM: PORTABLE CHEST 1 VIEW COMPARISON:  07/19/2015 and 10/21/2014 FINDINGS: The heart size and mediastinal contours are within normal limits. Both lungs are clear. The visualized skeletal structures are unremarkable. IMPRESSION: Normal exam. Electronically Signed   By: Lorriane Shire M.D.   On:  07/22/2015 10:36   I have personally reviewed and evaluated these images and lab results as part of my medical decision-making.   EKG Interpretation None      MDM    Chest x-ray is negative.  No evidence for pneumonia, or pneumothorax.   Pain improved, but not resolved. Pt continues to have back pain. SOB sensation mildly improving after pain meds. Pt remains tachycardic. Family report redness and rash of the face and shoulder is not her normal.  Rectal temp obtained and found to be 101.4. Oral Tylenol given to the patient. Lactic acid obtained. Potassium low at 2.8, glucose is 136 on [place] 7 metabolic panel. EKG reveals sinus tachy with borderline nonspecific T wave changes. No acute injury noted. Oral potassium given. D-dimer elevated. VQ scan ordered.. The troponin is negative for acute event. Urinalysis shows a cloudy yellow specimen with a specific gravity of 1.025. No evidence for acute infection at this time. The natruretic peptide is normal at 1.0. Lactic acid slightly elevated at 2.3. Sepsis protocol initiated. The VQ scan is negative for pulmonary embolus.  All findings discussed with the patient in terms which he understands. Discussed with patient the need for hospital admission. Case discussed with hospitalist. Patient to be admitted to medical surgical floor. Second lactic acid at 4:18 PM elevated at 3.0.    Final diagnoses:  Fever of unknown origin    *I have reviewed nursing notes, vital signs, and all appropriate lab and imaging results for this patient.[address], PA-C 07/22/15 1642  Milton Ferguson, MD 07/23/15 [phone]

## 2015-07-22 NOTE — ED Notes (Signed)
Hospitalist at bedside 

## 2015-07-22 NOTE — ED Notes (Signed)
EDP at bedside updating patient and family. 

## 2015-07-22 NOTE — ED Notes (Signed)
Pt placed on 2L nasal cannula with no increase in sats. Pt now on 4L. Sats 90-91%. Pt instructed to take slow deep breaths in through her nose and out through mouth in increase O2 and decrease tachypnea.

## 2015-07-22 NOTE — ED Notes (Signed)
X-ray at bedside

## 2015-07-22 NOTE — ED Notes (Signed)
Pt just transported to [place]. Will obtain blood cultures and antibiotics upon patient's return to ED.

## 2015-07-22 NOTE — H&P (Addendum)
History and Physical  Martha Espinoza [id number] DOB: 1965-07-12 DOA: 07/22/2015  Referring physician: Dr Dewayne Hatch, ED physician PCP: Tammi Sou, MD  Outpatient Specialists:   Dr Aline Brochure (Ortho)  Chief Complaint: Nausea and vomiting  HPI: Martha Espinoza is a 50 y.o. female with a history of chronic back pain due to arthritis, IBS, GERD, history of rectal cancer status post removal in 2001, bipolar type I, hypertension, asthma, PCO S, morbid obesity, multiple drug allergies. Patient was seen today for 4 days of nausea, vomiting, increased back pain, and fevers. The patient describes the emesis as bilious. Worse with food intake. Patient has Phenergan at home and tried to take Phenergan, that did not help. Her symptoms started after taking ivermectin on Monday evening because of scabies exposure. On Tuesday morning her symptoms began. She presented to emergency department and received Benadryl and famotidine and prescription for prednisone. She's been taking these without to much affect. Oral intake exacerbates her symptoms. Her symptoms did improve with IV Compazine in the emergency department today. Due to her multiple episodes of vomiting, she does have increased back pain with spasming. She initially presented with shortness of breath and tachypnea, although the patient describes that this was due to her extreme back pain.  Review of Systems:   Pt denies any chills, diarrhea, constipation, abdominal pain, shortness of breath, dyspnea on exertion, orthopnea, cough, wheezing, palpitations, headache, vision changes, lightheadedness, dizziness, melena, rectal bleeding.  Review of systems are otherwise negative  Past Medical History  Diagnosis Date  . Polycystic ovarian syndrome   . Colitis due to Clostridium difficile 2001  . Hepatic hemangioma   . Anxiety   . IBS (irritable bowel syndrome)   . History of Salmonella gastroenteritis   . GERD (gastroesophageal reflux disease)   .  Rectal cancer ([place])     a. Followed by Dr. Gala Romney, dx 2000-2001. Tumor removed from rectal.  . Bipolar 1 disorder ([place])   . Migraine headache   . Asthma   . Arthritis   . Hypertension     a. Improved without intervention.  . Recurrent sinusitis     +allergic rhinitis  . Tobacco dependence   . Hyperlipidemia     a. Noted 02/2014.  . Morbid obesity ([place])   . Microscopic hematuria     Intermittent (no w/u done yet, as of 03/2014)  . Pruritic dermatitis 2015/2016    +Scabies prep at [facility] 07/2014. (Primarily pruritic skin, but eventually a subtle rash as well)  . Iron deficiency anemia 05/2014    per pt it is not due to vaginal blood loss; hemoccults sent to pt in mail 413/16.  . Vitamin D deficiency   . Lumbar spondylosis     mild, mainly focused at L4-5 and L5-S1.   Past Surgical History  Procedure Laterality Date  . Cholecystectomy    . Colonoscopy  05/05/2007    adenoma  . Situ removed  age 19    High-grade rectal adenoma removed from rectum   . Dilation and curettage of uterus      for vaginal bleeding  . Cardiovascular stress test  03/24/14    Lexscan MIBI: mild anterior ischemia?  Cardiac CT angiogram recommended/done.  . Transthoracic echocardiogram  03/23/14    Normal  . Left heart catheterization with coronary angiogram N/A 03/31/2014    Normal coronaries, EF 0000000, diastolic dysfunction.  Procedure: LEFT HEART CATHETERIZATION WITH CORONARY ANGIOGRAM;  Surgeon: Sinclair Grooms, MD;  Location: [facility] CATH  LAB;  Service: Cardiovascular;  Laterality: N/A;  . Coronary ct angio  03/29/14    Two vessel dz/moderate stenosis of mid LAD and proximal RCA; cath recommended.   Social History:  reports that she has been smoking Cigarettes.  She has a 9 pack-year smoking history. She has never used smokeless tobacco. She reports that she does not drink alcohol or use illicit drugs. Patient lives at home  Allergies  Allergen Reactions  . Contrast Media [Iodinated Diagnostic  Agents] Anaphylaxis    Needs 13-hour prep  . Shellfish Allergy Anaphylaxis  . Codeine Nausea And Vomiting and Other (See Comments)    Hallucinations, Also sees people that are not there   . Betadine [Povidone Iodine] Rash  . Latex Hives  . Lipitor [Atorvastatin] Itching  . Permethrin Itching  . Tape Dermatitis    Family History  Problem Relation Age of Onset  . Colon cancer Father 79  . Cancer Father   . Diverticulitis Mother   . Heart disease Mother   . Hypertension Mother   . Hyperlipidemia Mother   . Mental illness Mother   . Diabetes Mother   . Heart attack Mother   . Stroke Neg Hx       Prior to Admission medications   Medication Sig Start Date End Date Taking? Authorizing Provider  albuterol (PROVENTIL) (2.5 MG/3ML) 0.083% nebulizer solution Take 3 mLs (2.5 mg total) by nebulization every 6 (six) hours as needed for wheezing or shortness of breath. 10/22/14  Yes Tammy Triplett, PA-C  albuterol (VENTOLIN HFA) 108 (90 Base) MCG/ACT inhaler Inhale 2 puffs into the lungs every 6 (six) hours as needed for wheezing or shortness of breath. 05/04/15  Yes Tammi Sou, MD  aspirin 81 MG chewable tablet Chew 1 tablet (81 mg total) by mouth daily. 09/06/12  Yes Ripley Fraise, MD  busPIRone (BUSPAR) 15 MG tablet Take 30 mg by mouth at bedtime.    Yes Historical Provider, MD  Cetirizine HCl (ZYRTEC ALLERGY) 10 MG CAPS Take 1 capsule (10 mg total) by mouth daily. 12/17/14  Yes Tammi Sou, MD  clonazePAM (KLONOPIN) 0.5 MG tablet TAKE ONE TABLET BY MOUTH DURING THE DAY AS NEEDED THEN TAKE TWO TABLETS DAILY AT BEDTIME 04/28/15  Yes Tammi Sou, MD  cyclobenzaprine (FLEXERIL) 10 MG tablet Take 1 tablet (10 mg total) by mouth every 6 (six) hours as needed (spasm related to headaches). 06/03/15  Yes Carole Civil, MD  diphenhydrAMINE (BENADRYL) 25 MG tablet Take 1 tablet (25 mg total) by mouth every 6 (six) hours. 07/19/15  Yes Fredia Sorrow, MD  famotidine (PEPCID) 20 MG tablet  Take 1 tablet (20 mg total) by mouth 2 (two) times daily. 07/19/15  Yes Fredia Sorrow, MD  fluticasone (FLONASE) 50 MCG/ACT nasal spray Place 2 sprays into both nostrils daily. 08/24/14  Yes Kristen N Ward, DO  HYDROcodone-acetaminophen (NORCO/VICODIN) 5-325 MG tablet Take 1-2 tablets by mouth every 6 (six) hours as needed. 07/04/15  Yes Tammi Sou, MD  hydrOXYzine (ATARAX/VISTARIL) 25 MG tablet Take 25 mg by mouth 3 (three) times daily.    Yes Historical Provider, MD  ivermectin (STROMECTOL) 3 MG TABS tablet 5 tabs po x 1 dose; repeat dose in 7-14 days Patient taking differently: Take 15 mg by mouth once. 5 tabs x 1 dose; repeat dose in 7-14 days 07/18/15  Yes Tammi Sou, MD  lamoTRIgine (LAMICTAL) 200 MG tablet Take 200 mg by mouth at bedtime.   Yes Historical Provider, MD  nitroGLYCERIN (NITROSTAT) 0.4 MG SL tablet Place 1 tablet (0.4 mg total) under the tongue every 5 (five) minutes as needed for chest pain. 03/25/14  Yes Eugenie Filler, MD  pantoprazole (PROTONIX) 40 MG tablet Take 1 tablet (40 mg total) by mouth daily. 06/13/15  Yes Tammi Sou, MD  predniSONE (DELTASONE) 10 MG tablet Take 4 tablets (40 mg total) by mouth daily. 07/19/15  Yes Fredia Sorrow, MD  promethazine (PHENERGAN) 25 MG tablet Take 1 tablet (25 mg total) by mouth every 6 (six) hours as needed for nausea or vomiting. 07/04/15  Yes Tammi Sou, MD    Physical Exam: BP 104/68 mmHg  Pulse 103  Temp(Src) 99.7 F (37.6 C) (Rectal)  Resp 18  SpO2 93%  LMP 07/12/2015  General: Middle-aged Caucasian female who appears old than stated age.. Awake and alert and oriented x3. No acute cardiopulmonary distress.  HEENT: Normocephalic atraumatic.  Right and left ears normal in appearance.  Pupils equal, round, reactive to light. Extraocular muscles are intact. Sclerae anicteric and noninjected.  Moist mucosal membranes. No mucosal lesions.  Neck: Neck supple without lymphadenopathy. No carotid bruits. No  masses palpated.  Cardiovascular: Regular rate with normal S1-S2 sounds. No murmurs, rubs, gallops auscultated. No JVD.  Respiratory: Good respiratory effort with no wheezes, rales, rhonchi. Lungs clear to auscultation bilaterally.  No accessory muscle use. Abdomen: Soft, mild tenderness in right lower quadrant. nondistended. Active bowel sounds. No masses or hepatosplenomegaly  Skin: No rashes, lesions, or ulcerations.  Dry, warm to touch. 2+ dorsalis pedis and radial pulses. Musculoskeletal: No calf or leg pain. All major joints not erythematous nontender.  No upper or lower joint deformation.  Good ROM.  No contractures.  Mild as process tenderness to the L4 area. This is chronic in nature. Paraspinal spasms.  Psychiatric: Intact judgment and insight. Pleasant and cooperative. Neurologic: No focal neurological deficits. Strength is 5/5 and symmetric in upper and lower extremities.  Cranial nerves II through XII are grossly intact.           Labs on Admission: I have personally reviewed following labs and imaging studies  CBC:  Recent Labs Lab 07/19/15 1549 07/22/15 1025  WBC 10.0 14.6*  HGB 13.4 12.4  HCT 39.4 36.8  MCV 85.3 86.4  PLT 307 [id number]   Basic Metabolic Panel:  Recent Labs Lab 07/19/15 1549 07/22/15 1025  NA 141 141  K 3.6 2.8*  CL 109 105  CO2 21* 23  GLUCOSE 155* 136*  BUN 12 15  CREATININE 0.69 0.90  CALCIUM 8.9 8.8*   GFR: Estimated Creatinine Clearance: 84.9 mL/min (by C-G formula based on Cr of 0.9). Liver Function Tests:  Recent Labs Lab 07/22/15 1025  AST 12*  ALT 11*  ALKPHOS 50  BILITOT 0.6  PROT 6.8  ALBUMIN 3.7   No results for input(s): LIPASE, AMYLASE in the last 168 hours. No results for input(s): AMMONIA in the last 168 hours. Coagulation Profile: No results for input(s): INR, PROTIME in the last 168 hours. Cardiac Enzymes:  Recent Labs Lab 07/19/15 1549 07/22/15 1025  TROPONINI <0.03 0.03   BNP (last 3 results) No results  for input(s): PROBNP in the last 8760 hours. HbA1C: No results for input(s): HGBA1C in the last 72 hours. CBG: No results for input(s): GLUCAP in the last 168 hours. Lipid Profile: No results for input(s): CHOL, HDL, LDLCALC, TRIG, CHOLHDL, LDLDIRECT in the last 72 hours. Thyroid Function Tests: No results for input(s): TSH, T4TOTAL, FREET4, T3FREE,  THYROIDAB in the last 72 hours. Anemia Panel: No results for input(s): VITAMINB12, FOLATE, FERRITIN, TIBC, IRON, RETICCTPCT in the last 72 hours. Urine analysis:    Component Value Date/Time   COLORURINE YELLOW 07/22/2015 1147   APPEARANCEUR CLOUDY* 07/22/2015 1147   LABSPEC 1.025 07/22/2015 1147   PHURINE 5.5 07/22/2015 1147   GLUCOSEU NEGATIVE 07/22/2015 1147   GLUCOSEU NEGATIVE 04/02/2014 1500   HGBUR NEGATIVE 07/22/2015 1147   BILIRUBINUR MODERATE* 07/22/2015 1147   BILIRUBINUR Neg 08/07/2013 1551   KETONESUR TRACE* 07/22/2015 1147   PROTEINUR 30* 07/22/2015 1147   PROTEINUR Neg 08/07/2013 1551   UROBILINOGEN 0.2 09/23/2014 1830   UROBILINOGEN 0.2 08/07/2013 1551   NITRITE NEGATIVE 07/22/2015 1147   NITRITE Neg 08/07/2013 1551   LEUKOCYTESUR NEGATIVE 07/22/2015 1147   Sepsis Labs: @LABRCNTIP (procalcitonin:4,lacticidven:4) ) Recent Results (from the past 240 hour(s))  Blood Culture (routine x 2)     Status: None (Preliminary result)   Collection Time: 07/22/15  3:10 PM  Result Value Ref Range Status   Specimen Description BLOOD RIGHT HAND  Final   Special Requests BOTTLES DRAWN AEROBIC AND ANAEROBIC 8CC EACH  Final   Culture PENDING  Incomplete   Report Status PENDING  Incomplete  Blood Culture (routine x 2)     Status: None (Preliminary result)   Collection Time: 07/22/15  3:16 PM  Result Value Ref Range Status   Specimen Description BLOOD LEFT HAND  Final   Special Requests BOTTLES DRAWN AEROBIC AND ANAEROBIC [place]  Final   Culture PENDING  Incomplete   Report Status PENDING  Incomplete     Radiological Exams on  Admission: Nm Pulmonary Perf And Vent  07/22/2015  CLINICAL DATA:  Shortness of breath and fevers EXAM: NUCLEAR MEDICINE VENTILATION - PERFUSION LUNG SCAN TECHNIQUE: Ventilation images were obtained in multiple projections using inhaled aerosol Tc-43m DTPA. Perfusion images were obtained in multiple projections after intravenous injection of Tc-72m MAA. RADIOPHARMACEUTICALS:  Thirty-three mCi Technetium-46m DTPA aerosol inhalation and 4.6 mCi Technetium-63m MAA IV COMPARISON:  None. FINDINGS: Ventilation: No focal ventilation defect. Perfusion: No wedge shaped peripheral perfusion defects to suggest acute pulmonary embolism. IMPRESSION: No ventilation perfusion defect to suggest pulmonary embolism is identified. Electronically Signed   By: Inez Catalina M.D.   On: 07/22/2015 15:12   Dg Chest Port 1 View  07/22/2015  CLINICAL DATA:  Shortness of breath.  Back pain.  Vomiting. EXAM: PORTABLE CHEST 1 VIEW COMPARISON:  07/19/2015 and 10/21/2014 FINDINGS: The heart size and mediastinal contours are within normal limits. Both lungs are clear. The visualized skeletal structures are unremarkable. IMPRESSION: Normal exam. Electronically Signed   By: Lorriane Shire M.D.   On: 07/22/2015 10:36    EKG: Independently reviewed. Sinus tachycardia. No acute ST elevation or depression.  Assessment/Plan: Active Problems:   Leukocytosis   Fever of unknown origin   Lactic acidosis   Hypokalemia   Nausea and vomiting   This patient was discussed with the ED physician, including pertinent vitals, physical exam findings, labs, and imaging.  We also discussed care given by the ED provider.  #1 fever of unknown origin  The patient is not septic  Discontinue sepsis protocol  Admit MedSurg  Discontinue antibiotics  Continue blood cultures, urine culture  Currently, the patient has no other symptoms other than the nausea and vomiting.  One of the side effects of ivermectin is fever, although we are more than 5  half lives away from the initial dose.  Tylenol for fever  We'll  watch for symptoms. Patient does have history of back injections - if patient continues to have fevers, may need MRI to rule out paraspinal abscess, even though the patient does not have increased tenderness to her spinal processes. #2 lactic acidosis  Improved. Continue IV fluids #3 nausea and vomiting  Into the IV fluids  Continue Compazine IV when necessary #4 leukocytosis  Likely elevated due to recent course of steroids  We'll discontinue steroids at this point repeat CBC in the morning #5 hypokalemia  IV potassium #6 chronic back pain  Norco and Flexeril for pain  Dilaudid when necessary for severe pain  DVT prophylaxis: Lovenox Consultants: None Code Status: Full code Family Communication: Patient's mother is in the room  Disposition Plan: Admission   Truett Mainland, DO [facility] Pager [phone]  If 7PM-7AM, please contact night-coverage [email or web address] Password [email or web address]

## 2015-07-23 ENCOUNTER — Inpatient Hospital Stay (HOSPITAL_COMMUNITY): Payer: Commercial Managed Care - PPO

## 2015-07-23 DIAGNOSIS — T50905A Adverse effect of unspecified drugs, medicaments and biological substances, initial encounter: Secondary | ICD-10-CM | POA: Diagnosis present

## 2015-07-23 DIAGNOSIS — R509 Fever, unspecified: Secondary | ICD-10-CM

## 2015-07-23 DIAGNOSIS — E876 Hypokalemia: Secondary | ICD-10-CM

## 2015-07-23 DIAGNOSIS — T887XXA Unspecified adverse effect of drug or medicament, initial encounter: Secondary | ICD-10-CM

## 2015-07-23 DIAGNOSIS — R112 Nausea with vomiting, unspecified: Secondary | ICD-10-CM

## 2015-07-23 DIAGNOSIS — E872 Acidosis: Secondary | ICD-10-CM

## 2015-07-23 LAB — COMPREHENSIVE METABOLIC PANEL
ALT: 12 U/L — ABNORMAL LOW (ref 14–54)
AST: 10 U/L — ABNORMAL LOW (ref 15–41)
Albumin: 3.2 g/dL — ABNORMAL LOW (ref 3.5–5.0)
Alkaline Phosphatase: 43 U/L (ref 38–126)
Anion gap: 7 (ref 5–15)
BUN: 8 mg/dL (ref 6–20)
CO2: 22 mmol/L (ref 22–32)
Calcium: 7.9 mg/dL — ABNORMAL LOW (ref 8.9–10.3)
Chloride: 107 mmol/L (ref 101–111)
Creatinine, Ser: 0.68 mg/dL (ref 0.44–1.00)
GFR calc Af Amer: >60 mL/min (ref >60)
GFR calc non Af Amer: >60 mL/min (ref >60)
Glucose, Bld: 120 mg/dL — ABNORMAL HIGH (ref 65–99)
Potassium: 3.7 mmol/L (ref 3.5–5.1)
Sodium: 136 mmol/L (ref 135–145)
Total Bilirubin: 0.3 mg/dL (ref 0.3–1.2)
Total Protein: 5.9 g/dL — ABNORMAL LOW (ref 6.5–8.1)

## 2015-07-23 LAB — CBC
HCT: 29.7 % — ABNORMAL LOW (ref 36.0–46.0)
Hemoglobin: 10.1 g/dL — ABNORMAL LOW (ref 12.0–15.0)
MCH: 29.5 pg (ref 26.0–34.0)
MCHC: 34 g/dL (ref 30.0–36.0)
MCV: 86.8 fL (ref 78.0–100.0)
Platelets: 221 10*3/uL (ref 150–400)
RBC: 3.42 MIL/uL — ABNORMAL LOW (ref 3.87–5.11)
RDW: 14.8 % (ref 11.5–15.5)
WBC: 9.3 10*3/uL (ref 4.0–10.5)

## 2015-07-23 LAB — LIPASE, BLOOD: Lipase: 14 U/L (ref 11–51)

## 2015-07-23 MED ORDER — DIPHENHYDRAMINE HCL 25 MG PO CAPS
25.0000 mg | ORAL_CAPSULE | Freq: Four times a day (QID) | ORAL | Status: DC
  Administered 2015-07-23 (×2): 25 mg via ORAL
  Filled 2015-07-23 (×2): qty 1

## 2015-07-23 MED ORDER — POTASSIUM CHLORIDE ER 20 MEQ PO TBCR: 20.0000 meq | EXTENDED_RELEASE_TABLET | Freq: Every day | ORAL | Status: DC

## 2015-07-23 MED ORDER — POTASSIUM CHLORIDE CRYS ER 20 MEQ PO TBCR
20.0000 meq | EXTENDED_RELEASE_TABLET | Freq: Two times a day (BID) | ORAL | Status: DC
  Administered 2015-07-23: 20 meq via ORAL
  Filled 2015-07-23: qty 1

## 2015-07-23 NOTE — Progress Notes (Signed)
Pt's IV catheter removed and intact. Pt's IV site clean dry and intact. Discharge instructions including follow up appointments and medications were reviewed and discussed with patient. Pt verbalized understanding of medications and follow up appointments. All questions were answered and no further questions at this time. Pt in stable condition and in no acute distress at this time. Pt will be escorted by nurse tech.

## 2015-07-23 NOTE — Discharge Summary (Signed)
Physician Discharge Summary  Martha Espinoza [id number] DOB: December 14, 1965 DOA: 07/22/2015  PCP: Tammi Sou, MD  Admit date: 07/22/2015 Discharge date: 07/23/2015  Time spent: Greater than 30 minutes  Recommendations for Outpatient Follow-up:  1. Recommend follow-up evaluation of patient serum potassium in the outpatient setting.  2. Recommend follow-up of urine and blood cultures.   Discharge Diagnoses:  1. Nausea and vomiting and rash secondary to Ivermectin. 2. Fever, presumed to be secondary to drug reaction to Ivermectin. 3. Leukocytosis secondary to steroid therapy. 4. Lactic acidosis, secondary to dehydration, nausea, vomiting. 5. Chronic low back pain. 6. Obesity. 7. Hypokalemia. 8. Elevated d-dimer. V/Q scan negative for PE. 9. Normocytic anemia.  Discharge Condition: Improved.  Diet recommendation: Heart healthy.  Filed Weights   07/22/15 1929  Weight: 107.502 kg (237 lb)    History of present illness:  Martha Espinoza is a 50 y.o. female with a history of chronic back pain due to arthritis, IBS, GERD, history of rectal cancer status post removal in 2001, bipolar type I, hypertension, asthma, morbid obesity, and multiple drug allergies. She presented to the ED for 4 days of nausea, vomiting, increased back pain, and fevers. The patient describes the emesis as bilious. Worse with food intake. Patient tried to take Phenergan at home, but it did not help. Her symptoms started after taking ivermectin a few days ago because of scabies exposure. She presented to the ED at that time and received Benadryl and famotidine and prescription for prednisone. Medications did not help. Due to her multiple episodes of vomiting, she developed more pain in her back from presumed spasms. Oral intake exacerbated her symptoms. Due to her GI symptoms and back pain, she presented again to the ED on 07/22/15. In the ED, she was febrile with a temperature 101.4, mildly tachycardic, oxygenating  within normal limits, and hemodynamically stable. Her lab data were significant for a serum potassium of 2.8, normal troponin I, normal BNP, elevated d-dimer, lactic acid of 3.0, WBC of 14.6, and noninfected appearing urinalysis. Her chest x-ray revealed no acute disease. She was admitted for further evaluation and management.  Hospital Course:  Patient was started on empiric antibiotics in the ED, but they were discontinued on admission due to no obvious evidence of infection. Blood cultures and a urine culture were ordered. Symptomatic treatment was started with as needed IV analgesics and as needed IV antiemetics. Her potassium was repleted orally and via the IV fluids. Patient noted that her symptoms started after taking ivermectin. After taking this medication for exposure to scabies, she developed nausea, vomiting, and a rash over her face. She does have a history of multiple allergies. She had taken prednisone, but it did not help her nausea and vomiting. For further evaluation, a number of studies were ordered. VQ scan was essentially normal. Because she undergoes regular injections for chronic back pain, there was a concern about a possible infection. Noncontrasted CT of her lumbar spine was ordered and it revealed no evidence of discitis or osteomyelitis. Lipase and LFTs were within normal limits.  Patient's fever resolved. Her nausea and vomiting resolved. Her leukocytosis was thought to be secondary to steroid therapy. Her white blood cell count normalized on follow-up lab data. Blood cultures and urine culture remained negative to date. Her lactic acidosis resolved. The initial elevation was thought to be secondary to volume depletion in the setting of nausea and vomiting. Her serum potassium improved to 3.7.  Given the temporal relationship of her symptoms with taking  ivermectin, it was felt that her sign-symptom complex was secondary to it. She is instructed not to take this medication again.  She voiced understanding. She was discharged on potassium chloride supplementation, otherwise, all of her other medications at discharge remained same.  Procedures:  None  Consultations:  None  Discharge Exam: Filed Vitals:   07/22/15 1929 07/23/15 0649  BP: 149/79 99/63  Pulse: 103 102  Temp: 99 F (37.2 C) 98.6 F (37 C)  Resp:  20  Oxygen saturation 93% on room air.  General: Pleasant obese 50 year old Caucasian woman in no acute distress. Cardiovascular: S1, S2, no murmurs rubs or gallops. Respiratory: Coarse breath sounds, but otherwise clear. Breathing nonlabored. Abdomen: Obese, positive bowel sounds, soft, nontender, nondistended. Musculoskeletal: Mild tenderness over the lumbosacral spine without spasm; no pain upon palpation and internal and external rotation of both hips.  Discharge Instructions   Discharge Instructions    Diet - low sodium heart healthy    Complete by:  As directed      Increase activity slowly    Complete by:  As directed           Discharge Medication List as of 07/23/2015  3:53 PM    START taking these medications   Details  potassium chloride 20 MEQ TBCR Take 20 mEq by mouth daily., Starting 07/23/2015, Until Discontinued, Print      CONTINUE these medications which have NOT CHANGED   Details  albuterol (PROVENTIL) (2.5 MG/3ML) 0.083% nebulizer solution Take 3 mLs (2.5 mg total) by nebulization every 6 (six) hours as needed for wheezing or shortness of breath., Starting 10/22/2014, Until Discontinued, Print    albuterol (VENTOLIN HFA) 108 (90 Base) MCG/ACT inhaler Inhale 2 puffs into the lungs every 6 (six) hours as needed for wheezing or shortness of breath., Starting 05/04/2015, Until Discontinued, Normal    aspirin 81 MG chewable tablet Chew 1 tablet (81 mg total) by mouth daily., Starting 09/06/2012, Until Discontinued, Print    busPIRone (BUSPAR) 15 MG tablet Take 30 mg by mouth at bedtime. , Until Discontinued, Historical Med     Cetirizine HCl (ZYRTEC ALLERGY) 10 MG CAPS Take 1 capsule (10 mg total) by mouth daily., Starting 12/17/2014, Until Discontinued, Normal    clonazePAM (KLONOPIN) 0.5 MG tablet TAKE ONE TABLET BY MOUTH DURING THE DAY AS NEEDED THEN TAKE TWO TABLETS DAILY AT BEDTIME, Print    cyclobenzaprine (FLEXERIL) 10 MG tablet Take 1 tablet (10 mg total) by mouth every 6 (six) hours as needed (spasm related to headaches)., Starting 06/03/2015, Until Discontinued, Normal    diphenhydrAMINE (BENADRYL) 25 MG tablet Take 1 tablet (25 mg total) by mouth every 6 (six) hours., Starting 07/19/2015, Until Discontinued, Print    famotidine (PEPCID) 20 MG tablet Take 1 tablet (20 mg total) by mouth 2 (two) times daily., Starting 07/19/2015, Until Discontinued, Print    fluticasone (FLONASE) 50 MCG/ACT nasal spray Place 2 sprays into both nostrils daily., Starting 08/24/2014, Until Discontinued, Print    HYDROcodone-acetaminophen (NORCO/VICODIN) 5-325 MG tablet Take 1-2 tablets by mouth every 6 (six) hours as needed., Starting 07/04/2015, Until Discontinued, Print    hydrOXYzine (ATARAX/VISTARIL) 25 MG tablet Take 25 mg by mouth 3 (three) times daily. , Until Discontinued, Historical Med    lamoTRIgine (LAMICTAL) 200 MG tablet Take 200 mg by mouth at bedtime., Until Discontinued, Historical Med    nitroGLYCERIN (NITROSTAT) 0.4 MG SL tablet Place 1 tablet (0.4 mg total) under the tongue every 5 (five) minutes as needed for  chest pain., Starting 03/25/2014, Until Discontinued, Print    pantoprazole (PROTONIX) 40 MG tablet Take 1 tablet (40 mg total) by mouth daily., Starting 06/13/2015, Until Discontinued, Normal    promethazine (PHENERGAN) 25 MG tablet Take 1 tablet (25 mg total) by mouth every 6 (six) hours as needed for nausea or vomiting., Starting 07/04/2015, Until Discontinued, Print      STOP taking these medications     ivermectin (STROMECTOL) 3 MG TABS tablet      predniSONE (DELTASONE) 10 MG tablet         Allergies  Allergen Reactions  . Contrast Media [Iodinated Diagnostic Agents] Anaphylaxis    Needs 13-hour prep  . Shellfish Allergy Anaphylaxis  . Codeine Nausea And Vomiting and Other (See Comments)    Hallucinations, Also sees people that are not there   . Betadine [Povidone Iodine] Rash  . Latex Hives  . Lipitor [Atorvastatin] Itching  . Permethrin Itching  . Tape Dermatitis   Follow-up Information    Follow up with MCGOWEN,PHILIP H, MD In 1 week.   Specialty:  Family Medicine   Contact information:   [id number] [place] [id number] [phone]        The results of significant diagnostics from this hospitalization (including imaging, microbiology, ancillary and laboratory) are listed below for reference.    Significant Diagnostic Studies: Ct Lumbar Spine Wo Contrast  07/23/2015  CLINICAL DATA:  Evaluate for paraspinal abscess. Fever of unknown origin. Four days nausea, vomiting, back pain. EXAM: CT LUMBAR SPINE WITHOUT CONTRAST TECHNIQUE: Multidetector CT imaging of the lumbar spine was performed without intravenous contrast administration. Multiplanar CT image reconstructions were also generated. COMPARISON:  MR lumbar spine 04/05/2015 FINDINGS: The alignment is anatomic. The vertebral body heights are maintained. There is no acute fracture or static listhesis. The paravertebral soft tissues are normal. The intraspinal soft tissues are not fully imaged on this examination due to poor soft tissue contrast, but there is no gross soft tissue abnormality. The disc spaces are maintained. There is mild bilateral facet arthropathy at L4-5. There is moderate bilateral facet arthropathy at L5-S1. There is abdominal aortic atherosclerosis. IMPRESSION: 1. No acute osseous injury of the lumbar spine. 2. No CT evidence of lumbar spine discitis/osteomyelitis. If there is further clinical concern, MRI without and with intravenous contrast is recommended for increased sensitivity.  Electronically Signed   By: Kathreen Devoid   On: 07/23/2015 12:24   Dg Epidurography  06/30/2015  CLINICAL DATA:  Left L4-5 spinal stenosis. Follow on the previous injection, the patient's lower extremity radicular pain was markedly improved. However, she now has persistent low back pain which radiates into the sacrum. FLUOROSCOPY TIME:  23.10 uGy*m2 PROCEDURE: The procedure, risks, benefits, and alternatives were explained to the patient. Questions regarding the procedure were encouraged and answered. The patient understands and consents to the procedure. LUMBAR EPIDURAL INJECTION: An interlaminar approach was performed on left at L4-5. The overlying skin was cleansed and anesthetized. A 20 gauge epidural needle was advanced using loss-of-resistance technique. DIAGNOSTIC EPIDURAL INJECTION: Injection of Isovue-M 200 shows a good epidural pattern with spread above and below the level of needle placement, primarily on the left no vascular opacification is seen. THERAPEUTIC EPIDURAL INJECTION: 120 Mg of Depo-Medrol mixed with 3 mL 1% lidocaine were instilled. The procedure was well-tolerated, and the patient was discharged thirty minutes following the injection in good condition. COMPLICATIONS: None IMPRESSION: Technically successful epidural injection on the left L4-5 # 2 If this injection  does not significantly relieve her low back and sacral pain, injection of arthritic facet joints at L4-5 may be useful. Electronically Signed   By: San Morelle M.D.   On: 06/30/2015 12:33   Nm Pulmonary Perf And Vent  07/22/2015  CLINICAL DATA:  Shortness of breath and fevers EXAM: NUCLEAR MEDICINE VENTILATION - PERFUSION LUNG SCAN TECHNIQUE: Ventilation images were obtained in multiple projections using inhaled aerosol Tc-46m DTPA. Perfusion images were obtained in multiple projections after intravenous injection of Tc-62m MAA. RADIOPHARMACEUTICALS:  Thirty-three mCi Technetium-26m DTPA aerosol inhalation and 4.6 mCi  Technetium-24m MAA IV COMPARISON:  None. FINDINGS: Ventilation: No focal ventilation defect. Perfusion: No wedge shaped peripheral perfusion defects to suggest acute pulmonary embolism. IMPRESSION: No ventilation perfusion defect to suggest pulmonary embolism is identified. Electronically Signed   By: Inez Catalina M.D.   On: 07/22/2015 15:12   Dg Chest Port 1 View  07/22/2015  CLINICAL DATA:  Shortness of breath.  Back pain.  Vomiting. EXAM: PORTABLE CHEST 1 VIEW COMPARISON:  07/19/2015 and 10/21/2014 FINDINGS: The heart size and mediastinal contours are within normal limits. Both lungs are clear. The visualized skeletal structures are unremarkable. IMPRESSION: Normal exam. Electronically Signed   By: Lorriane Shire M.D.   On: 07/22/2015 10:36   Dg Chest Port 1 View  07/19/2015  CLINICAL DATA:  Chest pain, cough and shortness of breath since this morning. EXAM: PORTABLE CHEST 1 VIEW COMPARISON:  October 21, 2014 FINDINGS: The heart size and mediastinal contours are within normal limits. There is no focal infiltrate, pulmonary edema, or pleural effusion. The visualized skeletal structures are unremarkable. IMPRESSION: No active cardiopulmonary disease. Electronically Signed   By: Abelardo Diesel M.D.   On: 07/19/2015 16:17    Microbiology: Recent Results (from the past 240 hour(s))  Urine culture     Status: None (Preliminary result)   Collection Time: 07/22/15 11:47 AM  Result Value Ref Range Status   Specimen Description URINE, RANDOM  Final   Special Requests Normal  Final   Culture   Final    NO GROWTH < 12 HOURS Performed at [facility]    Report Status PENDING  Incomplete  Blood Culture (routine x 2)     Status: None (Preliminary result)   Collection Time: 07/22/15  3:10 PM  Result Value Ref Range Status   Specimen Description BLOOD RIGHT HAND  Final   Special Requests BOTTLES DRAWN AEROBIC AND ANAEROBIC 8CC EACH  Final   Culture NO GROWTH < 24 HOURS  Final   Report Status PENDING   Incomplete  Blood Culture (routine x 2)     Status: None (Preliminary result)   Collection Time: 07/22/15  3:16 PM  Result Value Ref Range Status   Specimen Description BLOOD LEFT HAND  Final   Special Requests BOTTLES DRAWN AEROBIC AND ANAEROBIC [place]  Final   Culture NO GROWTH < 24 HOURS  Final   Report Status PENDING  Incomplete     Labs: Basic Metabolic Panel:  Recent Labs Lab 07/19/15 1549 07/22/15 1025 07/23/15 0554  NA 141 141 136  K 3.6 2.8* 3.7  CL 109 105 107  CO2 21* 23 22  GLUCOSE 155* 136* 120*  BUN 12 15 8   CREATININE 0.69 0.90 0.68  CALCIUM 8.9 8.8* 7.9*   Liver Function Tests:  Recent Labs Lab 07/22/15 1025 07/23/15 0554  AST 12* 10*  ALT 11* 12*  ALKPHOS 50 43  BILITOT 0.6 0.3  PROT 6.8 5.9*  ALBUMIN  3.7 3.2*    Recent Labs Lab 07/23/15 0554  LIPASE 14   No results for input(s): AMMONIA in the last 168 hours. CBC:  Recent Labs Lab 07/19/15 1549 07/22/15 1025 07/23/15 0554  WBC 10.0 14.6* 9.3  HGB 13.4 12.4 10.1*  HCT 39.4 36.8 29.7*  MCV 85.3 86.4 86.8  PLT 307 326 221   Cardiac Enzymes:  Recent Labs Lab 07/19/15 1549 07/22/15 1025  TROPONINI <0.03 0.03   BNP: BNP (last 3 results)  Recent Labs  07/22/15 1025  BNP 1.0    ProBNP (last 3 results) No results for input(s): PROBNP in the last 8760 hours.  CBG: No results for input(s): GLUCAP in the last 168 hours.     Signed:  Laura-Lee Villegas MD.  [facility] 07/23/2015, 5:57 PM

## 2015-07-24 LAB — URINE CULTURE
Culture: NO GROWTH
Special Requests: NORMAL

## 2015-07-25 ENCOUNTER — Telehealth: Payer: Self-pay | Admitting: *Deleted

## 2015-07-25 NOTE — Addendum Note (Signed)
Addended by: Onalee Hua on: 07/25/2015 11:31 AM   Modules accepted: Medications

## 2015-07-25 NOTE — Telephone Encounter (Signed)
Transition Care Management Follow-up Telephone Call   Date discharged? 07/23/15   How have you been since you were released from the hospital? Pt stated that she has improved some, rash is gone but feels sore. She stated she has some residual symptoms of sore throat and congestion.    Do you understand why you were in the hospital? yes   Do you understand the discharge instructions? yes   Where were you discharged to? Home   Items Reviewed:  Medications reviewed: yes  Allergies reviewed: yes  Dietary changes reviewed: no  Referrals reviewed: yes, follow up with PCP   Functional Questionnaire:   Activities of Daily Living (ADLs):   She states they are independent in the following: grooming, toileting, dressing, ambulation, bathing, feeding, and contience States they require assistance with the following: None   Any transportation issues/concerns?: no   Any patient concerns? no   Confirmed importance and date/time of follow-up visits scheduled yes  Provider Appointment booked with Dr. Anitra Lauth on 07/29/15 at 3:30pm  Confirmed with patient if condition begins to worsen call PCP or go to the ER.  Patient was given the office number and encouraged to call back with question or concerns.  : yes

## 2015-07-25 NOTE — Telephone Encounter (Signed)
Spoke with patient reviewed with patient discharge instructions, current medications,and instructions for follow up appt. Scheduled patient for 07/29/15 for hospital follow up. Patient states she was in hospital for reaction to medication. Hospital did update patient allergy list. TCM telephone interview completed.

## 2015-07-27 LAB — CULTURE, BLOOD (ROUTINE X 2)
Culture: NO GROWTH
Culture: NO GROWTH

## 2015-07-29 ENCOUNTER — Ambulatory Visit (INDEPENDENT_AMBULATORY_CARE_PROVIDER_SITE_OTHER): Payer: Commercial Managed Care - PPO | Admitting: Family Medicine

## 2015-07-29 ENCOUNTER — Encounter: Payer: Self-pay | Admitting: Family Medicine

## 2015-07-29 VITALS — BP 132/84 | HR 84 | Temp 97.8°F | Ht 60.0 in | Wt 236.4 lb

## 2015-07-29 DIAGNOSIS — T887XXD Unspecified adverse effect of drug or medicament, subsequent encounter: Secondary | ICD-10-CM | POA: Diagnosis not present

## 2015-07-29 DIAGNOSIS — T50905D Adverse effect of unspecified drugs, medicaments and biological substances, subsequent encounter: Secondary | ICD-10-CM

## 2015-07-29 DIAGNOSIS — R21 Rash and other nonspecific skin eruption: Secondary | ICD-10-CM | POA: Diagnosis not present

## 2015-07-29 DIAGNOSIS — I10 Essential (primary) hypertension: Secondary | ICD-10-CM

## 2015-07-29 DIAGNOSIS — Z5189 Encounter for other specified aftercare: Secondary | ICD-10-CM | POA: Diagnosis not present

## 2015-07-29 MED ORDER — HYDROCHLOROTHIAZIDE 25 MG PO TABS: 25.0000 mg | ORAL_TABLET | Freq: Every day | ORAL | Status: DC

## 2015-07-29 NOTE — Progress Notes (Signed)
07/29/2015  CC:  Chief Complaint  Patient presents with  . Hospitalization Follow-up    Patient is a 50 y.o. Caucasian female who presents for  hospital follow up, specifically [facility] face-to-face visit. Dates hospitalized: 4/28-4/29, 2017. Days since d/c from hospital: 7 Patient was discharged from hospital to home. Reason for admission to hospital: fever, nausea, vomiting, and rash believed to be secondary to ivermectin. Date of interactive (phone) contact with patient and/or caregiver:07/25/15  I have reviewed patient's discharge summary plus pertinent specific notes, labs, and imaging from the hospitalization.    Feeling well.  Says all vomiting and fevers have resolved.  She has had one episode of the flushing red macular rash that starts in face and goes down entire body.  This stayed about 2 hours and she felt presyncopal when it was present. She just started taking her supplemental potassium in the last few days.   The rash we thought was scabies went away pretty quick after ivermectin, but now she has noted a few more returning on hands.  Pts bp is consistently elevated both at her [facility] provider's office and at [facility] (150s over 100s).  Medication reconciliation was done today and patient is taking meds as recommended by discharging hospitalist/specialist.    PMH:  Past Medical History  Diagnosis Date  . Polycystic ovarian syndrome   . Colitis due to Clostridium difficile 2001  . Hepatic hemangioma   . Anxiety   . IBS (irritable bowel syndrome)   . History of Salmonella gastroenteritis   . GERD (gastroesophageal reflux disease)   . Rectal cancer ([place])     a. Followed by Dr. Gala Romney, dx 2000-2001. Tumor removed from rectal.  . Bipolar 1 disorder ([place])   . Migraine headache   . Asthma   . Arthritis   . Hypertension     a. Improved without intervention.  . Recurrent sinusitis     +allergic rhinitis  . Tobacco dependence   . Hyperlipidemia     a.  Noted 02/2014.  . Morbid obesity ([place])   . Microscopic hematuria     Intermittent (no w/u done yet, as of 03/2014)  . Pruritic dermatitis 2015/2016    +Scabies prep at [facility] 07/2014. (Primarily pruritic skin, but eventually a subtle rash as well)  . Iron deficiency anemia 05/2014    per pt it is not due to vaginal blood loss; hemoccults sent to pt in mail 413/16.  . Vitamin D deficiency   . Lumbar spondylosis     mild, mainly focused at L4-5 and L5-S1.    PSH:  Past Surgical History  Procedure Laterality Date  . Cholecystectomy    . Colonoscopy  05/05/2007    adenoma  . Situ removed  age 69    High-grade rectal adenoma removed from rectum   . Dilation and curettage of uterus      for vaginal bleeding  . Cardiovascular stress test  03/24/14    Lexscan MIBI: mild anterior ischemia?  Cardiac CT angiogram recommended/done.  . Transthoracic echocardiogram  03/23/14    Normal  . Left heart catheterization with coronary angiogram N/A 03/31/2014    Normal coronaries, EF 0000000, diastolic dysfunction.  Procedure: LEFT HEART CATHETERIZATION WITH CORONARY ANGIOGRAM;  Surgeon: Sinclair Grooms, MD;  Location: [facility] C[facility] LAB;  Service: Cardiovascular;  Laterality: N/A;  . Coronary ct angio  03/29/14    Two vessel dz/moderate stenosis of mid LAD and proximal RCA; cath recommended.  MEDS:  Outpatient Prescriptions Prior to Visit  Medication Sig Dispense Refill  . albuterol (PROVENTIL) (2.5 MG/3ML) 0.083% nebulizer solution Take 3 mLs (2.5 mg total) by nebulization every 6 (six) hours as needed for wheezing or shortness of breath. 75 mL 0  . albuterol (VENTOLIN HFA) 108 (90 Base) MCG/ACT inhaler Inhale 2 puffs into the lungs every 6 (six) hours as needed for wheezing or shortness of breath. 1 Inhaler 1  . aspirin 81 MG chewable tablet Chew 1 tablet (81 mg total) by mouth daily. 14 tablet 0  . busPIRone (BUSPAR) 15 MG tablet Take 30 mg by mouth at bedtime.     . Cetirizine HCl (ZYRTEC  ALLERGY) 10 MG CAPS Take 1 capsule (10 mg total) by mouth daily. 30 capsule 11  . clonazePAM (KLONOPIN) 0.5 MG tablet TAKE ONE TABLET BY MOUTH DURING THE DAY AS NEEDED THEN TAKE TWO TABLETS DAILY AT BEDTIME 90 tablet 5  . cyclobenzaprine (FLEXERIL) 10 MG tablet Take 1 tablet (10 mg total) by mouth every 6 (six) hours as needed (spasm related to headaches). 42 tablet 5  . diphenhydrAMINE (BENADRYL) 25 MG tablet Take 1 tablet (25 mg total) by mouth every 6 (six) hours. 20 tablet 0  . fluticasone (FLONASE) 50 MCG/ACT nasal spray Place 2 sprays into both nostrils daily. 16 g 1  . HYDROcodone-acetaminophen (NORCO/VICODIN) 5-325 MG tablet Take 1-2 tablets by mouth every 6 (six) hours as needed. 30 tablet 0  . hydrOXYzine (ATARAX/VISTARIL) 25 MG tablet Take 25 mg by mouth 3 (three) times daily.     Marland Kitchen lamoTRIgine (LAMICTAL) 200 MG tablet Take 200 mg by mouth at bedtime.    . nitroGLYCERIN (NITROSTAT) 0.4 MG SL tablet Place 1 tablet (0.4 mg total) under the tongue every 5 (five) minutes as needed for chest pain. 20 tablet 0  . pantoprazole (PROTONIX) 40 MG tablet Take 1 tablet (40 mg total) by mouth daily. 90 tablet 0  . potassium chloride 20 MEQ TBCR Take 20 mEq by mouth daily. 14 tablet 0  . promethazine (PHENERGAN) 25 MG tablet Take 1 tablet (25 mg total) by mouth every 6 (six) hours as needed for nausea or vomiting. 30 tablet 0  . famotidine (PEPCID) 20 MG tablet Take 1 tablet (20 mg total) by mouth 2 (two) times daily. (Patient not taking: Reported on 07/29/2015) 30 tablet 0   No facility-administered medications prior to visit.   EXAM: BP 132/84 mmHg  Pulse 84  Temp(Src) 97.8 F (36.6 C) (Oral)  Ht 5' (1.524 m)  Wt 236 lb 6.4 oz (107.23 kg)  BMI 46.17 kg/m2  SpO2 94%  LMP 07/12/2015 Gen: Alert, well appearing.  Patient is oriented to person, place, time, and situation. [id number]: no injection, icteris, swelling, or exudate.  EOMI, PERRLA. Mouth: lips without lesion/swelling.  Oral mucosa pink  and moist. Oropharynx without erythema, exudate, or swelling.  Neck - No masses or thyromegaly or limitation in range of motion CV: RRR, no m/r/g.   LUNGS: CTA bilat, nonlabored resps, good aeration in all lung fields. EXT: no clubbing, cyanosis, or edema.  SKIN: no rash.  Her hands have 1-2 nondescript, tiny focal dry skin peeling areas.  Pertinent labs/imaging None today  ASSESSMENT/PLAN:  1) Reaction to ivermectin; she is asymptomatic now except an occasional brief recurrence of rash. No new treatments/meds was given for this problem today.    2) Essential hypertension: start HCTZ 25mg  qd today.  She is already on 20 mEq KDUR qd for the next 2  wks . Will have her monitor her bp at least 3 times per week for the next 2 wks and see her back after that to review numbers. Will check BMET at that time as well as recheck CBC and check a ferritin (in review of recent hospital labs, her Hb dropped from 12.4 to 10.1).  Medical decision making of moderate complexity was utilized today.  An After Visit Summary was printed and given to the patient.  FOLLOW UP:  2 wks  Signed:  Crissie Sickles, MD           07/31/2015

## 2015-07-29 NOTE — Progress Notes (Signed)
Pre visit review using our clinic review tool, if applicable. No additional management support is needed unless otherwise documented below in the visit note. 

## 2015-07-31 ENCOUNTER — Encounter: Payer: Self-pay | Admitting: Family Medicine

## 2015-08-12 ENCOUNTER — Ambulatory Visit: Payer: Commercial Managed Care - PPO | Admitting: Family Medicine

## 2015-08-15 ENCOUNTER — Ambulatory Visit: Payer: Commercial Managed Care - PPO | Admitting: Family Medicine

## 2015-08-23 ENCOUNTER — Telehealth: Payer: Self-pay

## 2015-08-23 NOTE — Telephone Encounter (Signed)
Pt is requesting rx for Hydrocodone

## 2015-08-24 ENCOUNTER — Encounter: Payer: Self-pay | Admitting: Family Medicine

## 2015-08-24 ENCOUNTER — Ambulatory Visit (INDEPENDENT_AMBULATORY_CARE_PROVIDER_SITE_OTHER): Payer: Commercial Managed Care - PPO | Admitting: Family Medicine

## 2015-08-24 ENCOUNTER — Other Ambulatory Visit: Payer: Self-pay | Admitting: *Deleted

## 2015-08-24 VITALS — BP 120/80 | HR 100 | Temp 98.1°F | Resp 16 | Ht 60.0 in | Wt 232.5 lb

## 2015-08-24 DIAGNOSIS — D649 Anemia, unspecified: Secondary | ICD-10-CM

## 2015-08-24 DIAGNOSIS — I10 Essential (primary) hypertension: Secondary | ICD-10-CM | POA: Diagnosis not present

## 2015-08-24 DIAGNOSIS — K5900 Constipation, unspecified: Secondary | ICD-10-CM

## 2015-08-24 MED ORDER — POLYETHYLENE GLYCOL 3350 17 GM/SCOOP PO POWD: 17.0000 g | Freq: Two times a day (BID) | ORAL | Status: DC | PRN

## 2015-08-24 MED ORDER — GABAPENTIN 100 MG PO CAPS: 100.0000 mg | ORAL_CAPSULE | Freq: Three times a day (TID) | ORAL | Status: DC

## 2015-08-24 NOTE — Progress Notes (Signed)
Pre visit review using our clinic review tool, if applicable. No additional management support is needed unless otherwise documented below in the visit note. 

## 2015-08-24 NOTE — Patient Instructions (Signed)
Continue to check your blood pressure at least 2 times per week and write them down.  Bring these with you to your next follow up appointment with me.

## 2015-08-24 NOTE — Telephone Encounter (Signed)
Sent in Gabapentin to [facility] per Dr. Ruthe Mannan instructions

## 2015-08-24 NOTE — Telephone Encounter (Signed)
Routing to Dr Harrison for approval 

## 2015-08-24 NOTE — Progress Notes (Signed)
OFFICE VISIT  08/24/2015   CC:  Chief Complaint  Patient presents with  . Follow-up    HTN and labs     HPI:    Patient is a 50 y.o. Caucasian female who presents for f/u HTN. Outside bp checks at [facility] show avg syst 145, avg diast 92.  No HR data. Since last o/v she has taken a 2 week course of potassium b/c she was low when in the ED last. Has some random dizziness last few days assoc with increased stress.   No known side effect from HCTZ, which she has been taking daily.  She has another f/u appt with her orthopedist for her chronic low back pain soon.  She had anemia during last hospitalization--one day to the next her Hb dropped but no explanation as to why. No melena/hematochezia.  She has chronic constipation, takes otc stool softener.  I'm sure some of this is medication-related (her pain meds +/- her psych meds).   Past Medical History  Diagnosis Date  . Polycystic ovarian syndrome   . Colitis due to Clostridium difficile 2001  . Hepatic hemangioma   . Anxiety   . IBS (irritable bowel syndrome)   . History of Salmonella gastroenteritis   . GERD (gastroesophageal reflux disease)   . Rectal cancer ([place])     a. Followed by Dr. Gala Romney, dx 2000-2001. Tumor removed from rectal.  . Bipolar 1 disorder ([place])   . Migraine headache   . Asthma   . Arthritis   . Hypertension     HCTZ started 07/2015  . Recurrent sinusitis     +allergic rhinitis  . Tobacco dependence   . Hyperlipidemia     a. Noted 02/2014.  . Morbid obesity ([place])   . Microscopic hematuria     Intermittent (no w/u done yet, as of 03/2014)  . Pruritic dermatitis 2015/2016    +Scabies prep at [facility] 07/2014. (Primarily pruritic skin, but eventually a subtle rash as well)  . Iron deficiency anemia 05/2014    per pt it is not due to vaginal blood loss; hemoccults sent to pt in mail 413/16.  . Vitamin D deficiency   . Lumbar spondylosis     mild, mainly focused at L4-5 and L5-S1.    Past  Surgical History  Procedure Laterality Date  . Cholecystectomy    . Colonoscopy  05/05/2007    adenoma  . Situ removed  age 57    High-grade rectal adenoma removed from rectum   . Dilation and curettage of uterus      for vaginal bleeding  . Cardiovascular stress test  03/24/14    Lexscan MIBI: mild anterior ischemia?  Cardiac CT angiogram recommended/done.  . Transthoracic echocardiogram  03/23/14    Normal  . Left heart catheterization with coronary angiogram N/A 03/31/2014    Normal coronaries, EF 0000000, diastolic dysfunction.  Procedure: LEFT HEART CATHETERIZATION WITH CORONARY ANGIOGRAM;  Surgeon: Sinclair Grooms, MD;  Location: [facility] CATH LAB;  Service: Cardiovascular;  Laterality: N/A;  . Coronary ct angio  03/29/14    Two vessel dz/moderate stenosis of mid LAD and proximal RCA; cath recommended.    Outpatient Prescriptions Prior to Visit  Medication Sig Dispense Refill  . albuterol (PROVENTIL) (2.5 MG/3ML) 0.083% nebulizer solution Take 3 mLs (2.5 mg total) by nebulization every 6 (six) hours as needed for wheezing or shortness of breath. 75 mL 0  . albuterol (VENTOLIN HFA) 108 (90 Base) MCG/ACT inhaler Inhale 2  puffs into the lungs every 6 (six) hours as needed for wheezing or shortness of breath. 1 Inhaler 1  . aspirin 81 MG chewable tablet Chew 1 tablet (81 mg total) by mouth daily. 14 tablet 0  . busPIRone (BUSPAR) 15 MG tablet Take 30 mg by mouth at bedtime.     . Cetirizine HCl (ZYRTEC ALLERGY) 10 MG CAPS Take 1 capsule (10 mg total) by mouth daily. 30 capsule 11  . clonazePAM (KLONOPIN) 0.5 MG tablet TAKE ONE TABLET BY MOUTH DURING THE DAY AS NEEDED THEN TAKE TWO TABLETS DAILY AT BEDTIME 90 tablet 5  . cyclobenzaprine (FLEXERIL) 10 MG tablet Take 1 tablet (10 mg total) by mouth every 6 (six) hours as needed (spasm related to headaches). 42 tablet 5  . diphenhydrAMINE (BENADRYL) 25 MG tablet Take 1 tablet (25 mg total) by mouth every 6 (six) hours. 20 tablet 0  . Docusate Calcium  (STOOL SOFTENER PO) Take 3 tablets by mouth at bedtime.    . fluticasone (FLONASE) 50 MCG/ACT nasal spray Place 2 sprays into both nostrils daily. 16 g 1  . hydrochlorothiazide (HYDRODIURIL) 25 MG tablet Take 1 tablet (25 mg total) by mouth daily. 30 tablet 1  . HYDROcodone-acetaminophen (NORCO/VICODIN) 5-325 MG tablet Take 1-2 tablets by mouth every 6 (six) hours as needed. 30 tablet 0  . hydrOXYzine (ATARAX/VISTARIL) 25 MG tablet Take 25 mg by mouth 3 (three) times daily.     Marland Kitchen ibuprofen (ADVIL,MOTRIN) 200 MG tablet Take 600 mg by mouth every 6 (six) hours as needed.    . lamoTRIgine (LAMICTAL) 200 MG tablet Take 200 mg by mouth at bedtime.    . nitroGLYCERIN (NITROSTAT) 0.4 MG SL tablet Place 1 tablet (0.4 mg total) under the tongue every 5 (five) minutes as needed for chest pain. 20 tablet 0  . pantoprazole (PROTONIX) 40 MG tablet Take 1 tablet (40 mg total) by mouth daily. 90 tablet 0  . promethazine (PHENERGAN) 25 MG tablet Take 1 tablet (25 mg total) by mouth every 6 (six) hours as needed for nausea or vomiting. 30 tablet 0  . famotidine (PEPCID) 20 MG tablet Take 1 tablet (20 mg total) by mouth 2 (two) times daily. (Patient not taking: Reported on 08/24/2015) 30 tablet 0  . potassium chloride 20 MEQ TBCR Take 20 mEq by mouth daily. (Patient not taking: Reported on 08/24/2015) 14 tablet 0   No facility-administered medications prior to visit.    Allergies  Allergen Reactions  . Contrast Media [Iodinated Diagnostic Agents] Anaphylaxis    Needs 13-hour prep  . Shellfish Allergy Anaphylaxis  . Codeine Nausea And Vomiting and Other (See Comments)    Hallucinations, Also sees people that are not there   . Ivermectin Nausea And Vomiting    N/V and rash  . Betadine [Povidone Iodine] Rash  . Latex Hives  . Lipitor [Atorvastatin] Itching  . Permethrin Itching  . Tape Dermatitis    ROS As per HPI  PE: Blood pressure 120/80, pulse 100, temperature 98.1 F (36.7 C), temperature source  Oral, resp. rate 16, height 5' (1.524 m), weight 232 lb 8 oz (105.461 kg), last menstrual period 08/09/2015, SpO2 94 %. Gen: Alert, well appearing.  Patient is oriented to person, place, time, and situation. [id number]: no injection, icteris, swelling, or exudate.  EOMI, PERRLA. Mouth: lips without lesion/swelling.  Oral mucosa pink and moist. Oropharynx without erythema, exudate, or swelling.  CV: RRR, no m/r/g.   LUNGS: CTA bilat, nonlabored resps, good aeration in all  lung fields. EXT: no clubbing, cyanosis, or edema.  SKIN: no rash  LABS:  Lab Results  Component Value Date   TSH 0.462 03/23/2014   Lab Results  Component Value Date   WBC 9.3 07/23/2015   HGB 10.1* 07/23/2015   HCT 29.7* 07/23/2015   MCV 86.8 07/23/2015   PLT 221 07/23/2015   Lab Results  Component Value Date   CREATININE 0.68 07/23/2015   BUN 8 07/23/2015   NA 136 07/23/2015   K 3.7 07/23/2015   CL 107 07/23/2015   CO2 22 07/23/2015   Lab Results  Component Value Date   ALT 12* 07/23/2015   AST 10* 07/23/2015   ALKPHOS 43 07/23/2015   BILITOT 0.3 07/23/2015   Lab Results  Component Value Date   CHOL 263* 03/24/2014   Lab Results  Component Value Date   HDL 47 03/24/2014   Lab Results  Component Value Date   LDLCALC 169* 03/24/2014   Lab Results  Component Value Date   TRIG 237* 03/24/2014   Lab Results  Component Value Date   CHOLHDL 5.6 03/24/2014   IMPRESSION AND PLAN:  1) HTN; bp's ok on HCTZ.  I want to give her more time on this before deciding to add anything. BMET today.  2) Recent hypokalemia: recheck BMET today: she is s/p 2 weeks of K replacement.  3) Recent normocytic anemia: no clear explanation.  Will recheck CBC today as well as check a ferritin level.  4) Constipation, chronic:  Continue otc stool softener qd, and add miralax powder 1 capful bid prn.  An After Visit Summary was printed and given to the patient.  FOLLOW UP: Return in about 2 months (around  10/24/2015) for f/u HTN.  Signed:  Crissie Sickles, MD           08/24/2015

## 2015-08-24 NOTE — Telephone Encounter (Signed)
DECLINED WE CAN ADD GABAPENTIN AND SHE NEEDS TO GO FOR EPIDURALS   TRAMADOL IS ALSO AN OPTION

## 2015-08-25 LAB — BASIC METABOLIC PANEL
BUN: 12 mg/dL (ref 6–23)
CO2: 30 mEq/L (ref 19–32)
Calcium: 9.7 mg/dL (ref 8.4–10.5)
Chloride: 100 mEq/L (ref 96–112)
Creatinine, Ser: 0.72 mg/dL (ref 0.40–1.20)
GFR: 92.02 mL/min (ref >60.00)
Glucose, Bld: 101 mg/dL — ABNORMAL HIGH (ref 70–99)
Potassium: 3.8 mEq/L (ref 3.5–5.1)
Sodium: 139 mEq/L (ref 135–145)

## 2015-08-25 LAB — CBC WITH DIFFERENTIAL/PLATELET
Basophils Absolute: 0 10*3/uL (ref 0.0–0.1)
Basophils Relative: 0.6 % (ref 0.0–3.0)
Eosinophils Absolute: 0.1 10*3/uL (ref 0.0–0.7)
Eosinophils Relative: 1.3 % (ref 0.0–5.0)
HCT: 42.3 % (ref 36.0–46.0)
Hemoglobin: 13.9 g/dL (ref 12.0–15.0)
Lymphocytes Relative: 36.5 % (ref 12.0–46.0)
Lymphs Abs: 1.8 10*3/uL (ref 0.7–4.0)
MCHC: 32.8 g/dL (ref 30.0–36.0)
MCV: 86.1 fl (ref 78.0–100.0)
Monocytes Absolute: 0.2 10*3/uL (ref 0.1–1.0)
Monocytes Relative: 4.1 % (ref 3.0–12.0)
Neutro Abs: 2.9 10*3/uL (ref 1.4–7.7)
Neutrophils Relative %: 57.5 % (ref 43.0–77.0)
Platelets: 317 10*3/uL (ref 150.0–400.0)
RBC: 4.91 Mil/uL (ref 3.87–5.11)
RDW: 14.4 % (ref 11.5–15.5)
WBC: 5 10*3/uL (ref 4.0–10.5)

## 2015-08-25 LAB — FERRITIN: Ferritin: 22 ng/mL (ref 10.0–291.0)

## 2015-09-09 ENCOUNTER — Other Ambulatory Visit: Payer: Self-pay | Admitting: Family Medicine

## 2015-09-09 NOTE — Telephone Encounter (Signed)
RF request for pantoprazole LOV: 08/24/15 Next ov: 10/24/15 Last written: 06/13/15 #90 w/ /0Rf

## 2015-09-25 ENCOUNTER — Other Ambulatory Visit: Payer: Self-pay | Admitting: Family Medicine

## 2015-09-26 NOTE — Telephone Encounter (Signed)
RF request for hctz LOV: 08/24/15 Next ov: 10/24/15 Last written: 07/29/15 #30 w/ 1RF

## 2015-10-05 ENCOUNTER — Emergency Department (HOSPITAL_COMMUNITY): Payer: Commercial Managed Care - PPO

## 2015-10-05 ENCOUNTER — Emergency Department (HOSPITAL_COMMUNITY)
Admission: EM | Admit: 2015-10-05 | Discharge: 2015-10-05 | Disposition: A | Discharge status: Home / Self Care | Payer: Commercial Managed Care - PPO | Attending: Emergency Medicine | Admitting: Emergency Medicine

## 2015-10-05 ENCOUNTER — Encounter (HOSPITAL_COMMUNITY): Payer: Self-pay | Admitting: Emergency Medicine

## 2015-10-05 DIAGNOSIS — F17210 Nicotine dependence, cigarettes, uncomplicated: Secondary | ICD-10-CM | POA: Diagnosis not present

## 2015-10-05 DIAGNOSIS — Z79899 Other long term (current) drug therapy: Secondary | ICD-10-CM | POA: Insufficient documentation

## 2015-10-05 DIAGNOSIS — R079 Chest pain, unspecified: Secondary | ICD-10-CM | POA: Diagnosis not present

## 2015-10-05 DIAGNOSIS — Z791 Long term (current) use of non-steroidal anti-inflammatories (NSAID): Secondary | ICD-10-CM | POA: Insufficient documentation

## 2015-10-05 DIAGNOSIS — R51 Headache: Secondary | ICD-10-CM | POA: Insufficient documentation

## 2015-10-05 DIAGNOSIS — Z7982 Long term (current) use of aspirin: Secondary | ICD-10-CM | POA: Insufficient documentation

## 2015-10-05 DIAGNOSIS — E785 Hyperlipidemia, unspecified: Secondary | ICD-10-CM | POA: Diagnosis not present

## 2015-10-05 DIAGNOSIS — J45909 Unspecified asthma, uncomplicated: Secondary | ICD-10-CM | POA: Insufficient documentation

## 2015-10-05 DIAGNOSIS — I10 Essential (primary) hypertension: Secondary | ICD-10-CM | POA: Insufficient documentation

## 2015-10-05 DIAGNOSIS — M1990 Unspecified osteoarthritis, unspecified site: Secondary | ICD-10-CM | POA: Diagnosis not present

## 2015-10-05 DIAGNOSIS — E876 Hypokalemia: Secondary | ICD-10-CM | POA: Insufficient documentation

## 2015-10-05 DIAGNOSIS — R519 Headache, unspecified: Secondary | ICD-10-CM

## 2015-10-05 LAB — CBC WITH DIFFERENTIAL/PLATELET
Basophils Absolute: 0 10*3/uL (ref 0.0–0.1)
Basophils Relative: 0 %
Eosinophils Absolute: 0.1 10*3/uL (ref 0.0–0.7)
Eosinophils Relative: 1 %
HCT: 39.2 % (ref 36.0–46.0)
Hemoglobin: 13.5 g/dL (ref 12.0–15.0)
Lymphocytes Relative: 25 %
Lymphs Abs: 2.5 10*3/uL (ref 0.7–4.0)
MCH: 28.5 pg (ref 26.0–34.0)
MCHC: 34.4 g/dL (ref 30.0–36.0)
MCV: 82.7 fL (ref 78.0–100.0)
Monocytes Absolute: 0.4 10*3/uL (ref 0.1–1.0)
Monocytes Relative: 4 %
Neutro Abs: 6.9 10*3/uL (ref 1.7–7.7)
Neutrophils Relative %: 70 %
Platelets: 298 10*3/uL (ref 150–400)
RBC: 4.74 MIL/uL (ref 3.87–5.11)
RDW: 13.5 % (ref 11.5–15.5)
WBC: 9.8 10*3/uL (ref 4.0–10.5)

## 2015-10-05 LAB — COMPREHENSIVE METABOLIC PANEL
ALT: 11 U/L — ABNORMAL LOW (ref 14–54)
AST: 10 U/L — ABNORMAL LOW (ref 15–41)
Albumin: 3.8 g/dL (ref 3.5–5.0)
Alkaline Phosphatase: 46 U/L (ref 38–126)
Anion gap: 10 (ref 5–15)
BUN: 13 mg/dL (ref 6–20)
CO2: 27 mmol/L (ref 22–32)
Calcium: 8.7 mg/dL — ABNORMAL LOW (ref 8.9–10.3)
Chloride: 100 mmol/L — ABNORMAL LOW (ref 101–111)
Creatinine, Ser: 0.75 mg/dL (ref 0.44–1.00)
GFR calc Af Amer: >60 mL/min (ref >60)
GFR calc non Af Amer: >60 mL/min (ref >60)
Glucose, Bld: 104 mg/dL — ABNORMAL HIGH (ref 65–99)
Potassium: 2.8 mmol/L — ABNORMAL LOW (ref 3.5–5.1)
Sodium: 137 mmol/L (ref 135–145)
Total Bilirubin: 0.4 mg/dL (ref 0.3–1.2)
Total Protein: 6.9 g/dL (ref 6.5–8.1)

## 2015-10-05 LAB — TROPONIN I: Troponin I: <0.03 ng/mL (ref <0.03)

## 2015-10-05 MED ORDER — METOCLOPRAMIDE HCL 5 MG/ML IJ SOLN
10.0000 mg | Freq: Once | INTRAMUSCULAR | Status: AC
  Administered 2015-10-05: 10 mg via INTRAVENOUS
  Filled 2015-10-05: qty 2

## 2015-10-05 MED ORDER — DIPHENHYDRAMINE HCL 50 MG/ML IJ SOLN
25.0000 mg | Freq: Once | INTRAMUSCULAR | Status: AC
  Administered 2015-10-05: 25 mg via INTRAVENOUS
  Filled 2015-10-05: qty 1

## 2015-10-05 MED ORDER — POTASSIUM CHLORIDE CRYS ER 20 MEQ PO TBCR
40.0000 meq | EXTENDED_RELEASE_TABLET | Freq: Once | ORAL | Status: AC
  Administered 2015-10-05: 40 meq via ORAL
  Filled 2015-10-05: qty 2

## 2015-10-05 MED ORDER — HYDROCODONE-ACETAMINOPHEN 5-325 MG PO TABS: 1.0000 | ORAL_TABLET | ORAL | Status: DC | PRN

## 2015-10-05 MED ORDER — POTASSIUM CHLORIDE 20 MEQ PO PACK
40.0000 meq | PACK | Freq: Once | ORAL | Status: DC
  Filled 2015-10-05: qty 2

## 2015-10-05 MED ORDER — ONDANSETRON HCL 4 MG/2ML IJ SOLN
4.0000 mg | Freq: Once | INTRAMUSCULAR | Status: AC
  Administered 2015-10-05: 4 mg via INTRAVENOUS
  Filled 2015-10-05: qty 2

## 2015-10-05 MED ORDER — KETOROLAC TROMETHAMINE 30 MG/ML IJ SOLN
30.0000 mg | Freq: Once | INTRAMUSCULAR | Status: AC
  Administered 2015-10-05: 30 mg via INTRAVENOUS
  Filled 2015-10-05: qty 1

## 2015-10-05 MED ORDER — POTASSIUM CHLORIDE CRYS ER 20 MEQ PO TBCR: 20.0000 meq | EXTENDED_RELEASE_TABLET | Freq: Two times a day (BID) | ORAL | Status: DC

## 2015-10-05 MED ORDER — SODIUM CHLORIDE 0.9 % IV BOLUS (SEPSIS)
1000.0000 mL | Freq: Once | INTRAVENOUS | Status: AC
  Administered 2015-10-05: 1000 mL via INTRAVENOUS

## 2015-10-05 NOTE — Discharge Instructions (Signed)
Prescription for potassium and pain medicine. Follow-up your primary care doctor.

## 2015-10-05 NOTE — ED Notes (Signed)
PT c/o central chest pains radiating to her back x4 days. PT also c/o migraine for over 5 days.

## 2015-10-05 NOTE — ED Notes (Signed)
Pt states she has had a HA for several days. Light and sound sensitivities. Also reports a "bite" to the left hip that happened several weeks ago that "won't heal". No drainage or redness noted.

## 2015-10-05 NOTE — ED Provider Notes (Signed)
CSN: [id number]     Arrival date & time 10/05/15  1840 History   First MD Initiated Contact with Patient 10/05/15 1850     Chief Complaint  Patient presents with  . Chest Pain     (Consider location/radiation/quality/duration/timing/severity/associated sxs/prior Treatment) Patient is a 50 y.o. female presenting with chest pain.  Chest Pain ...Marland KitchenMarland KitchenPatient has had a migraine headache with photophobia for 5-6 days. Pain is located in the top of her scalp in the anterior aspect. No stiff neck, neurodeficits, fever, sweats, chills, dyspnea, diaphoresis, nausea.. Additionally, she has intermittent chest pain not associated with any activity. She was cathed 2 years ago which was normal. Patient has multiple health problems well documented in past medical history. Severity symptoms is moderate. She is currently on HCTZ for blood pressure  Past Medical History  Diagnosis Date  . Polycystic ovarian syndrome   . Colitis due to Clostridium difficile 2001  . Hepatic hemangioma   . Anxiety   . IBS (irritable bowel syndrome)   . History of Salmonella gastroenteritis   . GERD (gastroesophageal reflux disease)   . Rectal cancer ([place])     a. Followed by Dr. Gala Romney, dx 2000-2001. Tumor removed from rectal.  . Bipolar 1 disorder ([place])   . Migraine headache   . Asthma   . Arthritis   . Hypertension     HCTZ started 07/2015  . Recurrent sinusitis     +allergic rhinitis  . Tobacco dependence   . Hyperlipidemia     a. Noted 02/2014.  . Morbid obesity ([place])   . Microscopic hematuria     Intermittent (no w/u done yet, as of 03/2014)  . Pruritic dermatitis 2015/2016    +Scabies prep at [facility] 07/2014. (Primarily pruritic skin, but eventually a subtle rash as well)  . Iron deficiency anemia 05/2014    per pt it is not due to vaginal blood loss; hemoccults sent to pt in mail 413/16.  . Vitamin D deficiency   . Lumbar spondylosis     mild, mainly focused at L4-5 and L5-S1.   Past Surgical  History  Procedure Laterality Date  . Cholecystectomy    . Colonoscopy  05/05/2007    adenoma  . Situ removed  age 56    High-grade rectal adenoma removed from rectum   . Dilation and curettage of uterus      for vaginal bleeding  . Cardiovascular stress test  03/24/14    Lexscan MIBI: mild anterior ischemia?  Cardiac CT angiogram recommended/done.  . Transthoracic echocardiogram  03/23/14    Normal  . Left heart catheterization with coronary angiogram N/A 03/31/2014    Normal coronaries, EF 0000000, diastolic dysfunction.  Procedure: LEFT HEART CATHETERIZATION WITH CORONARY ANGIOGRAM;  Surgeon: Sinclair Grooms, MD;  Location: [facility] CATH LAB;  Service: Cardiovascular;  Laterality: N/A;  . Coronary ct angio  03/29/14    Two vessel dz/moderate stenosis of mid LAD and proximal RCA; cath recommended.   Family History  Problem Relation Age of Onset  . Colon cancer Father 60  . Cancer Father   . Diverticulitis Mother   . Heart disease Mother   . Hypertension Mother   . Hyperlipidemia Mother   . Mental illness Mother   . Diabetes Mother   . Heart attack Mother   . Stroke Neg Hx    Social History  Substance Use Topics  . Smoking status: Current Every Day Smoker -- 0.50 packs/day for 18 years    Types:  Cigarettes  . Smokeless tobacco: Never Used     Comment: 25 years  . Alcohol Use: No   OB History    Gravida Para Term Preterm AB TAB SAB Ectopic Multiple Living   4 1 1  3  3   1      Review of Systems  Cardiovascular: Positive for chest pain.  All other systems reviewed and are negative.     Allergies  Contrast media; Shellfish allergy; Codeine; Ivermectin; Betadine; Latex; Lipitor; Permethrin; and Tape  Home Medications   Prior to Admission medications   Medication Sig Start Date End Date Taking? Authorizing Provider  albuterol (VENTOLIN HFA) 108 (90 Base) MCG/ACT inhaler Inhale 2 puffs into the lungs every 6 (six) hours as needed for wheezing or shortness of breath. 05/04/15   Yes Tammi Sou, MD  aspirin 81 MG chewable tablet Chew 1 tablet (81 mg total) by mouth daily. 09/06/12  Yes Ripley Fraise, MD  busPIRone (BUSPAR) 15 MG tablet Take 30 mg by mouth at bedtime.    Yes Historical Provider, MD  Cetirizine HCl (ZYRTEC ALLERGY) 10 MG CAPS Take 1 capsule (10 mg total) by mouth daily. 12/17/14  Yes Tammi Sou, MD  clonazePAM (KLONOPIN) 0.5 MG tablet TAKE ONE TABLET BY MOUTH DURING THE DAY AS NEEDED THEN TAKE TWO TABLETS DAILY AT BEDTIME 04/28/15  Yes Tammi Sou, MD  cyclobenzaprine (FLEXERIL) 10 MG tablet Take 1 tablet (10 mg total) by mouth every 6 (six) hours as needed (spasm related to headaches). 06/03/15  Yes Carole Civil, MD  diphenhydrAMINE (BENADRYL) 25 MG tablet Take 1 tablet (25 mg total) by mouth every 6 (six) hours. 07/19/15  Yes Fredia Sorrow, MD  Docusate Calcium (STOOL SOFTENER PO) Take 3 tablets by mouth at bedtime.   Yes Historical Provider, MD  fluticasone (FLONASE) 50 MCG/ACT nasal spray Place 2 sprays into both nostrils daily. 08/24/14  Yes Kristen N Ward, DO  gabapentin (NEURONTIN) 100 MG capsule Take 1 capsule (100 mg total) by mouth 3 (three) times daily. 08/24/15  Yes Carole Civil, MD  hydrochlorothiazide (HYDRODIURIL) 25 MG tablet TAKE ONE TABLET BY MOUTH ONCE DAILY 09/26/15  Yes Tammi Sou, MD  hydrOXYzine (ATARAX/VISTARIL) 25 MG tablet Take 25 mg by mouth 3 (three) times daily.    Yes Historical Provider, MD  ibuprofen (ADVIL,MOTRIN) 200 MG tablet Take 600 mg by mouth every 6 (six) hours as needed.   Yes Historical Provider, MD  lamoTRIgine (LAMICTAL) 200 MG tablet Take 200 mg by mouth at bedtime.   Yes Historical Provider, MD  nitroGLYCERIN (NITROSTAT) 0.4 MG SL tablet Place 1 tablet (0.4 mg total) under the tongue every 5 (five) minutes as needed for chest pain. 03/25/14  Yes Eugenie Filler, MD  pantoprazole (PROTONIX) 40 MG tablet TAKE ONE TABLET BY MOUTH ONCE DAILY 09/09/15  Yes Tammi Sou, MD  polyethylene  glycol powder (GLYCOLAX/MIRALAX) powder Take 17 g by mouth 2 (two) times daily as needed. 08/24/15  Yes Tammi Sou, MD  promethazine (PHENERGAN) 25 MG tablet Take 1 tablet (25 mg total) by mouth every 6 (six) hours as needed for nausea or vomiting. 07/04/15  Yes Tammi Sou, MD  HYDROcodone-acetaminophen (NORCO/VICODIN) 5-325 MG tablet Take 1-2 tablets by mouth every 4 (four) hours as needed. 10/05/15   Nat Christen, MD  potassium chloride SA (K-DUR,KLOR-CON) 20 MEQ tablet Take 1 tablet (20 mEq total) by mouth 2 (two) times daily. 10/05/15   Nat Christen, MD  BP 115/72 mmHg  Pulse 92  Temp(Src) 98.3 F (36.8 C) (Oral)  Resp 21  Ht 5' (1.524 m)  Wt 228 lb (103.42 kg)  BMI 44.53 kg/m2  SpO2 90%  LMP 10/02/2015 Physical Exam  Constitutional: She is oriented to person, place, and time.  Obese  HENT:  Head: Normocephalic and atraumatic.  Eyes: Conjunctivae are normal.  Neck: Neck supple.  Cardiovascular: Normal rate and regular rhythm.   Pulmonary/Chest: Effort normal and breath sounds normal.  Abdominal: Soft. Bowel sounds are normal.  Musculoskeletal: Normal range of motion.  Neurological: She is alert and oriented to person, place, and time.  Skin: Skin is warm and dry.  Psychiatric: She has a normal mood and affect. Her behavior is normal.  Nursing note and vitals reviewed.   ED Course  Procedures (including critical care time) Labs Review Labs Reviewed  COMPREHENSIVE METABOLIC PANEL - Abnormal; Notable for the following:    Potassium 2.8 (*)    Chloride 100 (*)    Glucose, Bld 104 (*)    Calcium 8.7 (*)    AST 10 (*)    ALT 11 (*)    All other components within normal limits  CBC WITH DIFFERENTIAL/PLATELET  TROPONIN I    Imaging Review Dg Chest 2 View  10/05/2015  CLINICAL DATA:  50 year old female with chest pain for 4 days. EXAM: CHEST  2 VIEW COMPARISON:  07/22/2015 and prior exam FINDINGS: The cardiomediastinal silhouette is unremarkable. There is no evidence  of focal airspace disease, pulmonary edema, suspicious pulmonary nodule/mass, pleural effusion, or pneumothorax. No acute bony abnormalities are identified. IMPRESSION: No active cardiopulmonary disease. Electronically Signed   By: Margarette Canada M.D.   On: 10/05/2015 19:32   I have personally reviewed and evaluated these images and lab results as part of my medical decision-making.   EKG Interpretation   Date/Time:  Wednesday October 05 2015 18:53:22 EDT Ventricular Rate:  100 PR Interval:    QRS Duration: 108 QT Interval:  379 QTC Calculation: 489 R Axis:   21 Text Interpretation:  Sinus tachycardia Low voltage, precordial leads  Borderline repolarization abnormality Borderline prolonged QT interval  Confirmed by Lacinda Axon  MD, Joseantonio Dittmar ([id number]) on 10/05/2015 7:33:58 PM      MDM   Final diagnoses:  Intractable headache, unspecified chronicity pattern, unspecified headache type  Chest pain, unspecified chest pain type  Hypokalemia    No evidence of an acute neurological event. Patient feels better after IV fluids, IV Toradol, Reglan, Benadryl. Potassium noted to be low. Discussed hydrochlorothiazide as being associated with hypokalemia.  She is feeling much better at discharge. No neuro deficits. Discharge medications potassium and Norco    Nat Christen, MD 10/05/15 2226

## 2015-10-18 ENCOUNTER — Inpatient Hospital Stay (HOSPITAL_COMMUNITY): Payer: Commercial Managed Care - PPO

## 2015-10-18 ENCOUNTER — Inpatient Hospital Stay (HOSPITAL_COMMUNITY)
Admission: EM | Admit: 2015-10-18 | Discharge: 2015-10-19 | DRG: 190 | Disposition: A | Discharge status: Home / Self Care | Payer: Commercial Managed Care - PPO | Attending: Internal Medicine | Admitting: Internal Medicine

## 2015-10-18 ENCOUNTER — Emergency Department (HOSPITAL_COMMUNITY): Payer: Commercial Managed Care - PPO

## 2015-10-18 ENCOUNTER — Encounter (HOSPITAL_COMMUNITY): Payer: Self-pay | Admitting: *Deleted

## 2015-10-18 DIAGNOSIS — Z818 Family history of other mental and behavioral disorders: Secondary | ICD-10-CM

## 2015-10-18 DIAGNOSIS — R52 Pain, unspecified: Secondary | ICD-10-CM

## 2015-10-18 DIAGNOSIS — Z833 Family history of diabetes mellitus: Secondary | ICD-10-CM

## 2015-10-18 DIAGNOSIS — J9601 Acute respiratory failure with hypoxia: Secondary | ICD-10-CM | POA: Diagnosis present

## 2015-10-18 DIAGNOSIS — Z85048 Personal history of other malignant neoplasm of rectum, rectosigmoid junction, and anus: Secondary | ICD-10-CM

## 2015-10-18 DIAGNOSIS — Z888 Allergy status to other drugs, medicaments and biological substances status: Secondary | ICD-10-CM | POA: Diagnosis not present

## 2015-10-18 DIAGNOSIS — I10 Essential (primary) hypertension: Secondary | ICD-10-CM | POA: Diagnosis present

## 2015-10-18 DIAGNOSIS — B349 Viral infection, unspecified: Secondary | ICD-10-CM | POA: Diagnosis present

## 2015-10-18 DIAGNOSIS — R0602 Shortness of breath: Secondary | ICD-10-CM | POA: Diagnosis present

## 2015-10-18 DIAGNOSIS — Z91048 Other nonmedicinal substance allergy status: Secondary | ICD-10-CM | POA: Diagnosis not present

## 2015-10-18 DIAGNOSIS — K219 Gastro-esophageal reflux disease without esophagitis: Secondary | ICD-10-CM | POA: Diagnosis present

## 2015-10-18 DIAGNOSIS — G8929 Other chronic pain: Secondary | ICD-10-CM

## 2015-10-18 DIAGNOSIS — E872 Acidosis, unspecified: Secondary | ICD-10-CM | POA: Diagnosis present

## 2015-10-18 DIAGNOSIS — Z8249 Family history of ischemic heart disease and other diseases of the circulatory system: Secondary | ICD-10-CM

## 2015-10-18 DIAGNOSIS — Z91040 Latex allergy status: Secondary | ICD-10-CM

## 2015-10-18 DIAGNOSIS — R112 Nausea with vomiting, unspecified: Secondary | ICD-10-CM | POA: Diagnosis not present

## 2015-10-18 DIAGNOSIS — E785 Hyperlipidemia, unspecified: Secondary | ICD-10-CM | POA: Diagnosis present

## 2015-10-18 DIAGNOSIS — M549 Dorsalgia, unspecified: Secondary | ICD-10-CM

## 2015-10-18 DIAGNOSIS — Z8619 Personal history of other infectious and parasitic diseases: Secondary | ICD-10-CM | POA: Diagnosis not present

## 2015-10-18 DIAGNOSIS — Z91041 Radiographic dye allergy status: Secondary | ICD-10-CM

## 2015-10-18 DIAGNOSIS — Z800 Family history of malignant neoplasm of digestive organs: Secondary | ICD-10-CM | POA: Diagnosis not present

## 2015-10-18 DIAGNOSIS — J441 Chronic obstructive pulmonary disease with (acute) exacerbation: Secondary | ICD-10-CM | POA: Diagnosis not present

## 2015-10-18 DIAGNOSIS — R000 Tachycardia, unspecified: Secondary | ICD-10-CM

## 2015-10-18 DIAGNOSIS — Z91013 Allergy to seafood: Secondary | ICD-10-CM | POA: Diagnosis not present

## 2015-10-18 DIAGNOSIS — Z885 Allergy status to narcotic agent status: Secondary | ICD-10-CM | POA: Diagnosis not present

## 2015-10-18 DIAGNOSIS — E860 Dehydration: Secondary | ICD-10-CM | POA: Diagnosis present

## 2015-10-18 DIAGNOSIS — F17210 Nicotine dependence, cigarettes, uncomplicated: Secondary | ICD-10-CM | POA: Diagnosis present

## 2015-10-18 DIAGNOSIS — F319 Bipolar disorder, unspecified: Secondary | ICD-10-CM | POA: Diagnosis present

## 2015-10-18 DIAGNOSIS — A419 Sepsis, unspecified organism: Secondary | ICD-10-CM

## 2015-10-18 DIAGNOSIS — Z6841 Body Mass Index (BMI) 40.0 and over, adult: Secondary | ICD-10-CM

## 2015-10-18 LAB — URINE MICROSCOPIC-ADD ON

## 2015-10-18 LAB — COMPREHENSIVE METABOLIC PANEL
ALT: <5 U/L — ABNORMAL LOW (ref 14–54)
AST: 24 U/L (ref 15–41)
Albumin: 3.4 g/dL — ABNORMAL LOW (ref 3.5–5.0)
Alkaline Phosphatase: 42 U/L (ref 38–126)
Anion gap: 10 (ref 5–15)
BUN: 12 mg/dL (ref 6–20)
CO2: 21 mmol/L — ABNORMAL LOW (ref 22–32)
Calcium: 7.7 mg/dL — ABNORMAL LOW (ref 8.9–10.3)
Chloride: 101 mmol/L (ref 101–111)
Creatinine, Ser: 0.98 mg/dL (ref 0.44–1.00)
GFR calc Af Amer: >60 mL/min (ref >60)
GFR calc non Af Amer: >60 mL/min (ref >60)
Glucose, Bld: 152 mg/dL — ABNORMAL HIGH (ref 65–99)
Potassium: 3.2 mmol/L — ABNORMAL LOW (ref 3.5–5.1)
Sodium: 132 mmol/L — ABNORMAL LOW (ref 135–145)
Total Bilirubin: 0.6 mg/dL (ref 0.3–1.2)
Total Protein: 6.3 g/dL — ABNORMAL LOW (ref 6.5–8.1)

## 2015-10-18 LAB — CBC WITH DIFFERENTIAL/PLATELET
Basophils Absolute: 0 10*3/uL (ref 0.0–0.1)
Basophils Relative: 0 %
Eosinophils Absolute: 0 10*3/uL (ref 0.0–0.7)
Eosinophils Relative: 0 %
HCT: 35.4 % — ABNORMAL LOW (ref 36.0–46.0)
Hemoglobin: 12 g/dL (ref 12.0–15.0)
Lymphocytes Relative: 8 %
Lymphs Abs: 1.2 10*3/uL (ref 0.7–4.0)
MCH: 28.4 pg (ref 26.0–34.0)
MCHC: 33.9 g/dL (ref 30.0–36.0)
MCV: 83.7 fL (ref 78.0–100.0)
Monocytes Absolute: 1.2 10*3/uL — ABNORMAL HIGH (ref 0.1–1.0)
Monocytes Relative: 8 %
Neutro Abs: 12.6 10*3/uL — ABNORMAL HIGH (ref 1.7–7.7)
Neutrophils Relative %: 84 %
Platelets: 314 10*3/uL (ref 150–400)
RBC: 4.23 MIL/uL (ref 3.87–5.11)
RDW: 13.7 % (ref 11.5–15.5)
WBC: 15 10*3/uL — ABNORMAL HIGH (ref 4.0–10.5)

## 2015-10-18 LAB — BLOOD GAS, VENOUS
Acid-base deficit: 0.1 mmol/L (ref 0.0–2.0)
Bicarbonate: 24.1 mEq/L — ABNORMAL HIGH (ref 20.0–24.0)
O2 Content: 2 L/min
O2 Saturation: 88.9 %
Patient temperature: 37
TCO2: 14.6 mmol/L (ref 0–100)
pCO2, Ven: 40.4 mmHg — ABNORMAL LOW (ref 45.0–50.0)
pH, Ven: 7.393 — ABNORMAL HIGH (ref 7.250–7.300)
pO2, Ven: 59.1 mmHg — ABNORMAL HIGH (ref 31.0–45.0)

## 2015-10-18 LAB — URINALYSIS, ROUTINE W REFLEX MICROSCOPIC
Bilirubin Urine: NEGATIVE
Glucose, UA: NEGATIVE mg/dL
Ketones, ur: NEGATIVE mg/dL
Leukocytes, UA: NEGATIVE
Nitrite: NEGATIVE
Protein, ur: 30 mg/dL — AB
Specific Gravity, Urine: >1.03 — ABNORMAL HIGH (ref 1.005–1.030)
pH: 6 (ref 5.0–8.0)

## 2015-10-18 LAB — I-STAT CHEM 8, ED
BUN: 10 mg/dL (ref 6–20)
Calcium, Ion: 1.01 mmol/L — ABNORMAL LOW (ref 1.13–1.30)
Chloride: 99 mmol/L — ABNORMAL LOW (ref 101–111)
Creatinine, Ser: 0.9 mg/dL (ref 0.44–1.00)
Glucose, Bld: 154 mg/dL — ABNORMAL HIGH (ref 65–99)
HCT: 36 % (ref 36.0–46.0)
Hemoglobin: 12.2 g/dL (ref 12.0–15.0)
Potassium: 3.4 mmol/L — ABNORMAL LOW (ref 3.5–5.1)
Sodium: 136 mmol/L (ref 135–145)
TCO2: 21 mmol/L (ref 0–100)

## 2015-10-18 LAB — RAPID URINE DRUG SCREEN, HOSP PERFORMED
Amphetamines: NOT DETECTED
Barbiturates: NOT DETECTED
Benzodiazepines: POSITIVE — AB
Cocaine: NOT DETECTED
Opiates: POSITIVE — AB
Tetrahydrocannabinol: NOT DETECTED

## 2015-10-18 LAB — LACTIC ACID, PLASMA
Lactic Acid, Venous: 5.7 mmol/L (ref 0.5–1.9)
Lactic Acid, Venous: 4.4 mmol/L (ref 0.5–1.9)
Lactic Acid, Venous: 1.4 mmol/L (ref 0.5–1.9)

## 2015-10-18 MED ORDER — POLYETHYLENE GLYCOL 3350 17 G PO PACK: 17.0000 g | PACK | Freq: Two times a day (BID) | ORAL | Status: DC | PRN

## 2015-10-18 MED ORDER — POTASSIUM CHLORIDE IN NACL 40-0.9 MEQ/L-% IV SOLN
INTRAVENOUS | Status: DC
  Administered 2015-10-18 – 2015-10-19 (×2): 125 mL/h via INTRAVENOUS

## 2015-10-18 MED ORDER — LAMOTRIGINE 100 MG PO TABS
200.0000 mg | ORAL_TABLET | Freq: Every day | ORAL | Status: DC
  Administered 2015-10-18: 200 mg via ORAL
  Filled 2015-10-18: qty 2

## 2015-10-18 MED ORDER — CLONAZEPAM 0.5 MG PO TABS
0.5000 mg | ORAL_TABLET | Freq: Every day | ORAL | Status: DC
  Administered 2015-10-19: 0.5 mg via ORAL
  Filled 2015-10-18: qty 1

## 2015-10-18 MED ORDER — CLONAZEPAM 0.5 MG PO TABS
1.0000 mg | ORAL_TABLET | Freq: Every day | ORAL | Status: DC
  Administered 2015-10-18: 1 mg via ORAL
  Filled 2015-10-18: qty 2

## 2015-10-18 MED ORDER — HYDROXYZINE HCL 25 MG PO TABS
25.0000 mg | ORAL_TABLET | Freq: Three times a day (TID) | ORAL | Status: DC
  Administered 2015-10-18 – 2015-10-19 (×2): 25 mg via ORAL
  Filled 2015-10-18 (×3): qty 1

## 2015-10-18 MED ORDER — DOCUSATE SODIUM 100 MG PO CAPS
100.0000 mg | ORAL_CAPSULE | Freq: Two times a day (BID) | ORAL | Status: DC
Start: 2015-10-18 — End: 2015-10-19
  Administered 2015-10-18 – 2015-10-19 (×2): 100 mg via ORAL
  Filled 2015-10-18 (×2): qty 1

## 2015-10-18 MED ORDER — VANCOMYCIN HCL IN DEXTROSE 1-5 GM/200ML-% IV SOLN
1000.0000 mg | Freq: Once | INTRAVENOUS | Status: AC
  Administered 2015-10-18: 1000 mg via INTRAVENOUS
  Filled 2015-10-18: qty 200

## 2015-10-18 MED ORDER — VANCOMYCIN HCL IN DEXTROSE 1-5 GM/200ML-% IV SOLN
1000.0000 mg | Freq: Three times a day (TID) | INTRAVENOUS | Status: DC
  Administered 2015-10-19 (×2): 1000 mg via INTRAVENOUS
  Filled 2015-10-18 (×2): qty 200

## 2015-10-18 MED ORDER — METHYLPREDNISOLONE SODIUM SUCC 125 MG IJ SOLR
60.0000 mg | Freq: Two times a day (BID) | INTRAMUSCULAR | Status: DC
  Administered 2015-10-19: 60 mg via INTRAVENOUS
  Filled 2015-10-18: qty 2

## 2015-10-18 MED ORDER — IPRATROPIUM-ALBUTEROL 0.5-2.5 (3) MG/3ML IN SOLN
3.0000 mL | Freq: Four times a day (QID) | RESPIRATORY_TRACT | Status: DC
  Administered 2015-10-18 – 2015-10-19 (×3): 3 mL via RESPIRATORY_TRACT
  Filled 2015-10-18 (×3): qty 3

## 2015-10-18 MED ORDER — IBUPROFEN 600 MG PO TABS: 600.0000 mg | ORAL_TABLET | Freq: Four times a day (QID) | ORAL | Status: DC | PRN

## 2015-10-18 MED ORDER — POTASSIUM CHLORIDE CRYS ER 20 MEQ PO TBCR
20.0000 meq | EXTENDED_RELEASE_TABLET | Freq: Two times a day (BID) | ORAL | Status: DC
  Administered 2015-10-18 – 2015-10-19 (×2): 20 meq via ORAL
  Filled 2015-10-18 (×2): qty 1

## 2015-10-18 MED ORDER — BUSPIRONE HCL 5 MG PO TABS
15.0000 mg | ORAL_TABLET | Freq: Every day | ORAL | Status: DC
  Administered 2015-10-19: 15 mg via ORAL
  Filled 2015-10-18: qty 3

## 2015-10-18 MED ORDER — ACETAMINOPHEN 325 MG PO TABS: 650.0000 mg | ORAL_TABLET | Freq: Four times a day (QID) | ORAL | Status: DC | PRN

## 2015-10-18 MED ORDER — SODIUM CHLORIDE 0.9 % IV BOLUS (SEPSIS)
1000.0000 mL | Freq: Once | INTRAVENOUS | Status: AC
  Administered 2015-10-18: 1000 mL via INTRAVENOUS

## 2015-10-18 MED ORDER — PROMETHAZINE HCL 12.5 MG PO TABS
25.0000 mg | ORAL_TABLET | Freq: Four times a day (QID) | ORAL | Status: DC | PRN
  Administered 2015-10-18: 25 mg via ORAL
  Filled 2015-10-18: qty 2

## 2015-10-18 MED ORDER — PIPERACILLIN-TAZOBACTAM 3.375 G IVPB 30 MIN
3.3750 g | Freq: Once | INTRAVENOUS | Status: AC
  Administered 2015-10-18: 3.375 g via INTRAVENOUS
  Filled 2015-10-18: qty 50

## 2015-10-18 MED ORDER — METHYLPREDNISOLONE SODIUM SUCC 125 MG IJ SOLR
125.0000 mg | Freq: Once | INTRAMUSCULAR | Status: AC
Start: 2015-10-18 — End: 2015-10-18
  Administered 2015-10-18: 125 mg via INTRAVENOUS
  Filled 2015-10-18: qty 2

## 2015-10-18 MED ORDER — MORPHINE SULFATE (PF) 4 MG/ML IV SOLN
4.0000 mg | Freq: Once | INTRAVENOUS | Status: AC
  Administered 2015-10-18: 4 mg via INTRAVENOUS
  Filled 2015-10-18: qty 1

## 2015-10-18 MED ORDER — SODIUM CHLORIDE 0.9% FLUSH
3.0000 mL | Freq: Two times a day (BID) | INTRAVENOUS | Status: DC
  Administered 2015-10-18 – 2015-10-19 (×2): 3 mL via INTRAVENOUS

## 2015-10-18 MED ORDER — HYDROCHLOROTHIAZIDE 25 MG PO TABS
25.0000 mg | ORAL_TABLET | Freq: Every day | ORAL | Status: DC
  Administered 2015-10-19: 25 mg via ORAL
  Filled 2015-10-18: qty 1

## 2015-10-18 MED ORDER — ONDANSETRON HCL 4 MG PO TABS: 4.0000 mg | ORAL_TABLET | Freq: Four times a day (QID) | ORAL | Status: DC | PRN

## 2015-10-18 MED ORDER — BUSPIRONE HCL 5 MG PO TABS: 15.0000 mg | ORAL_TABLET | Freq: Two times a day (BID) | ORAL | Status: DC

## 2015-10-18 MED ORDER — DEXTROSE 5 % IV SOLN
500.0000 mg | INTRAVENOUS | Status: DC
  Administered 2015-10-19: 500 mg via INTRAVENOUS
  Filled 2015-10-18 (×2): qty 500

## 2015-10-18 MED ORDER — ONDANSETRON HCL 4 MG/2ML IJ SOLN: 4.0000 mg | Freq: Four times a day (QID) | INTRAMUSCULAR | Status: DC | PRN

## 2015-10-18 MED ORDER — ASPIRIN 81 MG PO CHEW
81.0000 mg | CHEWABLE_TABLET | Freq: Every day | ORAL | Status: DC
  Administered 2015-10-19: 81 mg via ORAL
  Filled 2015-10-18: qty 1

## 2015-10-18 MED ORDER — PANTOPRAZOLE SODIUM 40 MG PO TBEC
40.0000 mg | DELAYED_RELEASE_TABLET | Freq: Every day | ORAL | Status: DC
Start: 2015-10-19 — End: 2015-10-19
  Administered 2015-10-19: 40 mg via ORAL
  Filled 2015-10-18: qty 1

## 2015-10-18 MED ORDER — CYCLOBENZAPRINE HCL 10 MG PO TABS
10.0000 mg | ORAL_TABLET | Freq: Once | ORAL | Status: AC
  Administered 2015-10-18: 10 mg via ORAL
  Filled 2015-10-18: qty 1

## 2015-10-18 MED ORDER — SODIUM CHLORIDE 0.9 % IV SOLN: INTRAVENOUS | Status: DC

## 2015-10-18 MED ORDER — BUSPIRONE HCL 5 MG PO TABS
30.0000 mg | ORAL_TABLET | Freq: Every day | ORAL | Status: DC
  Administered 2015-10-18: 30 mg via ORAL
  Filled 2015-10-18: qty 6

## 2015-10-18 MED ORDER — DIPHENHYDRAMINE HCL 25 MG PO TABS
25.0000 mg | ORAL_TABLET | Freq: Four times a day (QID) | ORAL | Status: DC | PRN
  Filled 2015-10-18: qty 1

## 2015-10-18 MED ORDER — DEXTROSE 5 % IV SOLN
INTRAVENOUS | Status: AC
  Filled 2015-10-18: qty 500

## 2015-10-18 MED ORDER — ENOXAPARIN SODIUM 40 MG/0.4ML ~~LOC~~ SOLN: 40.0000 mg | SUBCUTANEOUS | Status: DC

## 2015-10-18 MED ORDER — CYCLOBENZAPRINE HCL 10 MG PO TABS: 10.0000 mg | ORAL_TABLET | Freq: Four times a day (QID) | ORAL | Status: DC | PRN

## 2015-10-18 MED ORDER — LORATADINE 10 MG PO TABS
10.0000 mg | ORAL_TABLET | Freq: Every day | ORAL | Status: DC
  Administered 2015-10-19: 10 mg via ORAL
  Filled 2015-10-18: qty 1

## 2015-10-18 MED ORDER — ACETAMINOPHEN 650 MG RE SUPP: 650.0000 mg | Freq: Four times a day (QID) | RECTAL | Status: DC | PRN

## 2015-10-18 MED ORDER — ALBUTEROL SULFATE (2.5 MG/3ML) 0.083% IN NEBU: 3.0000 mL | INHALATION_SOLUTION | Freq: Four times a day (QID) | RESPIRATORY_TRACT | Status: DC | PRN

## 2015-10-18 MED ORDER — ALBUTEROL SULFATE (2.5 MG/3ML) 0.083% IN NEBU: 2.5000 mg | INHALATION_SOLUTION | RESPIRATORY_TRACT | Status: DC | PRN

## 2015-10-18 MED ORDER — HYDROCORTISONE NA SUCCINATE PF 100 MG IJ SOLR: 200.0000 mg | Freq: Once | INTRAMUSCULAR | Status: DC

## 2015-10-18 MED ORDER — HYDROCODONE-ACETAMINOPHEN 5-325 MG PO TABS
1.0000 | ORAL_TABLET | ORAL | Status: DC | PRN
  Administered 2015-10-18 – 2015-10-19 (×3): 2 via ORAL
  Filled 2015-10-18 (×3): qty 2

## 2015-10-18 MED ORDER — FLUTICASONE PROPIONATE 50 MCG/ACT NA SUSP
2.0000 | Freq: Every day | NASAL | Status: DC
  Administered 2015-10-19: 2 via NASAL
  Filled 2015-10-18: qty 16

## 2015-10-18 NOTE — ED Notes (Signed)
RT at bedside.

## 2015-10-18 NOTE — ED Notes (Signed)
Pt was given 1G of tylenol in route, 4mg  zofran, and a saline bolus of 1089ml

## 2015-10-18 NOTE — ED Triage Notes (Signed)
Pt comes in by EMS for fever and vomiting for the last 2 days. Pt is tachy on arrival at 130. Alert and oriented.    Pt states she is having cramping in her muscles. She takes medication for low potassium.

## 2015-10-18 NOTE — ED Notes (Signed)
CRITICAL VALUE ALERT  Critical value received:    PH 7.393  Date of notification:  10/18/15  Time of notification:  1829  Critical value read back:Yes.    Nurse who received alert:  Charmayne Sheer, RN  MD notified (1st page):  Long

## 2015-10-18 NOTE — H&P (Signed)
History and Physical  Martha Espinoza [id number] DOB: December 27, 1965 DOA: 10/18/2015  Referring physician: Dr Laverta Baltimore, ED physician PCP: Tammi Sou, MD  Outpatient Specialists:   Dr. Aline Brochure (Ortho)  Chief Complaint: Shortness of breath, nausea, vomiting  HPI: Martha Espinoza is a 50 y.o. female with a history of  chronic back pain due to arthritis, IBS, GERD, history of rectal cancer status post resection in 2001, bipolar type I, hypertension, asthma, PCO S, morbid obesity, multiple drug allergies. Patient reports chest pains with shortness of breath that started approximately 10 days ago. She was evaluated in the emergency department on 7/12 for the chest pain with feeling flushed, which resolved after IV fluids, IV Toradol, Reglan, Benadryl. Her potassium was found to be low and she was prescribed potassium and sent home.  The patient reports a steady decline since coming home from March department with weakness, shortness of breath that has been increasing. Patient has laid in bed for most of that time. Her shortness of breath is worse with activity and improves with rest. This morning, the patient started vomiting and reports vomiting 15 times since. She is unable to take food or liquid, as this worsens her symptoms. She has not vomited since being in the emergency department. She denies fevers, chills, though she has warm and flushed. She denies drug use or medication changes.   Review of Systems:   Pt denies any fevers, chills, diarrhea, constipation, abdominal pain, cough, palpitations, headache, vision changes, lightheadedness, dizziness, melena, rectal bleeding.  Review of systems are otherwise negative  Past Medical History:  Diagnosis Date  . Anxiety   . Arthritis   . Asthma   . Bipolar 1 disorder ([place])   . Colitis due to Clostridium difficile 2001  . GERD (gastroesophageal reflux disease)   . Hepatic hemangioma   . History of Salmonella gastroenteritis   .  Hyperlipidemia    a. Noted 02/2014.  Marland Kitchen Hypertension    HCTZ started 07/2015  . IBS (irritable bowel syndrome)   . Iron deficiency anemia 05/2014   per pt it is not due to vaginal blood loss; hemoccults sent to pt in mail 413/16.  . Lumbar spondylosis    mild, mainly focused at L4-5 and L5-S1.  . Microscopic hematuria    Intermittent (no w/u done yet, as of 03/2014)  . Migraine headache   . Morbid obesity ([place])   . Polycystic ovarian syndrome   . Pruritic dermatitis 2015/2016   +Scabies prep at [facility] 07/2014. (Primarily pruritic skin, but eventually a subtle rash as well)  . Rectal cancer ([place])    a. Followed by Dr. Gala Romney, dx 2000-2001. Tumor removed from rectal.  . Recurrent sinusitis    +allergic rhinitis  . Tobacco dependence   . Vitamin D deficiency    Past Surgical History:  Procedure Laterality Date  . CARDIOVASCULAR STRESS TEST  03/24/14   Lexscan MIBI: mild anterior ischemia?  Cardiac CT angiogram recommended/done.  . CHOLECYSTECTOMY    . COLONOSCOPY  05/05/2007   adenoma  . coronary CT angio  03/29/14   Two vessel dz/moderate stenosis of mid LAD and proximal RCA; cath recommended.  Marland Kitchen DILATION AND CURETTAGE OF UTERUS     for vaginal bleeding  . LEFT HEART CATHETERIZATION WITH CORONARY ANGIOGRAM N/A 03/31/2014   Normal coronaries, EF 0000000, diastolic dysfunction.  Procedure: LEFT HEART CATHETERIZATION WITH CORONARY ANGIOGRAM;  Surgeon: Sinclair Grooms, MD;  Location: [facility] CATH LAB;  Service: Cardiovascular;  Laterality: N/A;  .  Situ removed  age 47   High-grade rectal adenoma removed from rectum   . TRANSTHORACIC ECHOCARDIOGRAM  03/23/14   Normal   Social History:  reports that she has been smoking Cigarettes.  She has a 9.00 pack-year smoking history. She has never used smokeless tobacco. She reports that she does not drink alcohol or use drugs. Patient lives at [place]  . Contrast Media [Iodinated Diagnostic Agents] Anaphylaxis      Needs 13-hour prep  . Shellfish Allergy Anaphylaxis  . Codeine Nausea And Vomiting and Other (See Comments)    Hallucinations, Also sees people that are not there   . Ivermectin Nausea And Vomiting    N/V and rash  . Betadine [Povidone Iodine] Rash  . Latex Hives  . Lipitor [Atorvastatin] Itching  . Permethrin Itching  . Tape Dermatitis    Family History  Problem Relation Age of Onset  . Colon cancer Father 55  . Cancer Father   . Diverticulitis Mother   . Heart disease Mother   . Hypertension Mother   . Hyperlipidemia Mother   . Mental illness Mother   . Diabetes Mother   . Heart attack Mother   . Stroke Neg Hx       Prior to Admission medications   Medication Sig Start Date End Date Taking? Authorizing Provider  albuterol (VENTOLIN HFA) 108 (90 Base) MCG/ACT inhaler Inhale 2 puffs into the lungs every 6 (six) hours as needed for wheezing or shortness of breath. 05/04/15  Yes Tammi Sou, MD  aspirin 81 MG chewable tablet Chew 1 tablet (81 mg total) by mouth daily. 09/06/12  Yes Ripley Fraise, MD  busPIRone (BUSPAR) 15 MG tablet Take 15-30 mg by mouth 2 (two) times daily. 1 in the morning and 2 at bedtime   Yes Historical Provider, MD  Cetirizine HCl (ZYRTEC ALLERGY) 10 MG CAPS Take 1 capsule (10 mg total) by mouth daily. 12/17/14  Yes Tammi Sou, MD  clonazePAM (KLONOPIN) 0.5 MG tablet TAKE ONE TABLET BY MOUTH DURING THE DAY AS NEEDED THEN TAKE TWO TABLETS DAILY AT BEDTIME 04/28/15  Yes Tammi Sou, MD  cyclobenzaprine (FLEXERIL) 10 MG tablet Take 1 tablet (10 mg total) by mouth every 6 (six) hours as needed (spasm related to headaches). Patient taking differently: Take 10 mg by mouth at bedtime. Prescribed every 6 hours as needed for muscle spasms 06/03/15  Yes Carole Civil, MD  diphenhydrAMINE (BENADRYL) 25 MG tablet Take 1 tablet (25 mg total) by mouth every 6 (six) hours. Patient taking differently: Take 25 mg by mouth every 6 (six) hours as needed for  itching or allergies.  07/19/15  Yes Fredia Sorrow, MD  Docusate Calcium (STOOL SOFTENER PO) Take 3 tablets by mouth at bedtime.   Yes Historical Provider, MD  hydrochlorothiazide (HYDRODIURIL) 25 MG tablet TAKE ONE TABLET BY MOUTH ONCE DAILY 09/26/15  Yes Tammi Sou, MD  HYDROcodone-acetaminophen (NORCO/VICODIN) 5-325 MG tablet Take 1-2 tablets by mouth every 4 (four) hours as needed. 10/05/15  Yes Nat Christen, MD  hydrOXYzine (ATARAX/VISTARIL) 25 MG tablet Take 25 mg by mouth 3 (three) times daily.    Yes Historical Provider, MD  ibuprofen (ADVIL,MOTRIN) 200 MG tablet Take 600 mg by mouth every 6 (six) hours as needed for mild pain or moderate pain.    Yes Historical Provider, MD  lamoTRIgine (LAMICTAL) 200 MG tablet Take 200 mg by mouth at bedtime.   Yes Historical Provider, MD  nitroGLYCERIN (  NITROSTAT) 0.4 MG SL tablet Place 1 tablet (0.4 mg total) under the tongue every 5 (five) minutes as needed for chest pain. 03/25/14  Yes Eugenie Filler, MD  pantoprazole (PROTONIX) 40 MG tablet TAKE ONE TABLET BY MOUTH ONCE DAILY 09/09/15  Yes Tammi Sou, MD  polyethylene glycol powder (GLYCOLAX/MIRALAX) powder Take 17 g by mouth 2 (two) times daily as needed. Patient taking differently: Take 17 g by mouth 2 (two) times daily as needed for mild constipation or moderate constipation.  08/24/15  Yes Tammi Sou, MD  potassium chloride SA (K-DUR,KLOR-CON) 20 MEQ tablet Take 1 tablet (20 mEq total) by mouth 2 (two) times daily. 10/05/15  Yes Nat Christen, MD  promethazine (PHENERGAN) 25 MG tablet Take 1 tablet (25 mg total) by mouth every 6 (six) hours as needed for nausea or vomiting. 07/04/15  Yes Tammi Sou, MD  fluticasone (FLONASE) 50 MCG/ACT nasal spray Place 2 sprays into both nostrils daily. 08/24/14   Delice Bison Ward, DO    Physical Exam: BP 126/63 (BP Location: Left Arm)   Pulse 113   Temp 99.5 F (37.5 C) (Oral)   Resp (!) 32   Ht 5' (1.524 m)   Wt 104.3 kg (230 lb)   LMP  10/02/2015   SpO2 96%   BMI 44.92 kg/m   General: Middle-aged Caucasian female. Awake and alert and oriented x3. No acute cardiopulmonary distress.  HEENT: Normocephalic atraumatic.  Right and left ears normal in appearance.  Pupils equal, round, reactive to light. Extraocular muscles are intact. Sclerae anicteric and noninjected.  Dry mucosal membranes. No mucosal lesions.  Neck: Neck supple without lymphadenopathy. No carotid bruits. No masses palpated.  Cardiovascular: Regular rate with normal S1-S2 sounds. No murmurs, rubs, gallops auscultated. No JVD.  Respiratory: Poor respiratory effort with wheezing especially in the upper lung spaces. No rails. Patient is tachypneic and has mild accessory muscle use. Abdomen: Soft, nontender, nondistended. Active bowel sounds. No masses or hepatosplenomegaly  Skin: No rashes, lesions, or ulcerations.  Dry, warm to touch. 2+ dorsalis pedis and radial pulses. Musculoskeletal: No calf or leg pain. All major joints not erythematous nontender.  No upper or lower joint deformation.  Good ROM.  No contractures  Psychiatric: Intact judgment and insight. Pleasant and cooperative. Neurologic: No focal neurological deficits. Strength is 5/5 and symmetric in upper and lower extremities.  Cranial nerves II through XII are grossly intact.           Labs on Admission: I have personally reviewed following labs and imaging studies  CBC:  Recent Labs Lab 10/18/15 1707 10/18/15 1714  WBC 15.0*  --   NEUTROABS 12.6*  --   HGB 12.0 12.2  HCT 35.4* 36.0  MCV 83.7  --   PLT 314  --    Basic Metabolic Panel:  Recent Labs Lab 10/18/15 1707 10/18/15 1714  NA 132* 136  K 3.2* 3.4*  CL 101 99*  CO2 21*  --   GLUCOSE 152* 154*  BUN 12 10  CREATININE 0.98 0.90  CALCIUM 7.7*  --    GFR: Estimated Creatinine Clearance: 84.2 mL/min (by C-G formula based on SCr of 0.9 mg/dL). Liver Function Tests:  Recent Labs Lab 10/18/15 1707  AST 24  ALT <5*    ALKPHOS 42  BILITOT 0.6  PROT 6.3*  ALBUMIN 3.4*   No results for input(s): LIPASE, AMYLASE in the last 168 hours. No results for input(s): AMMONIA in the last 168 hours. Coagulation Profile:  No results for input(s): INR, PROTIME in the last 168 hours. Cardiac Enzymes: No results for input(s): CKTOTAL, CKMB, CKMBINDEX, TROPONINI in the last 168 hours. BNP (last 3 results) No results for input(s): PROBNP in the last 8760 hours. HbA1C: No results for input(s): HGBA1C in the last 72 hours. CBG: No results for input(s): GLUCAP in the last 168 hours. Lipid Profile: No results for input(s): CHOL, HDL, LDLCALC, TRIG, CHOLHDL, LDLDIRECT in the last 72 hours. Thyroid Function Tests: No results for input(s): TSH, T4TOTAL, FREET4, T3FREE, THYROIDAB in the last 72 hours. Anemia Panel: No results for input(s): VITAMINB12, FOLATE, FERRITIN, TIBC, IRON, RETICCTPCT in the last 72 hours. Urine analysis:    Component Value Date/Time   COLORURINE YELLOW 10/18/2015 [place] 10/18/2015 1851   LABSPEC >1.030 (H) 10/18/2015 1851   PHURINE 6.0 10/18/2015 1851   GLUCOSEU NEGATIVE 10/18/2015 1851   GLUCOSEU NEGATIVE 04/02/2014 1500   HGBUR MODERATE (A) 10/18/2015 1851   BILIRUBINUR NEGATIVE 10/18/2015 1851   BILIRUBINUR Neg 08/07/2013 1551   KETONESUR NEGATIVE 10/18/2015 1851   PROTEINUR 30 (A) 10/18/2015 1851   UROBILINOGEN 0.2 09/23/2014 1830   NITRITE NEGATIVE 10/18/2015 1851   LEUKOCYTESUR NEGATIVE 10/18/2015 1851   Sepsis Labs: @LABRCNTIP (procalcitonin:4,lacticidven:4) )No results found for this or any previous visit (from the past 240 hour(s)).   Radiological Exams on Admission: Ct Abdomen Pelvis Wo Contrast  Result Date: 10/18/2015 CLINICAL DATA:  Abdominal pain with nausea and vomiting for 2 days EXAM: CT ABDOMEN AND PELVIS WITHOUT CONTRAST TECHNIQUE: Multidetector CT imaging of the abdomen and pelvis was performed following the standard protocol without IV contrast.  COMPARISON:  09/23/2014 FINDINGS: Lower chest:  No acute findings. Hepatobiliary: The gallbladder has been surgically removed. The liver is within normal limits. Pancreas: No mass or inflammatory process identified on this un-enhanced exam. Spleen: Within normal limits in size. Adrenals/Urinary Tract: The adrenals are within normal limits. No renal calculi or obstructive changes are noted. A cyst is noted in the posterior lower pole of the left kidney measuring 2.2 cm. This has decreased in size from the prior exam a which time it measured 3.3 cm. Stomach/Bowel: No evidence of obstruction, inflammatory process, or abnormal fluid collections. The appendix is within normal limits. Vascular/Lymphatic: No pathologically enlarged lymph nodes. No evidence of abdominal aortic aneurysm. Reproductive: No mass or other significant abnormality. Other: None. Musculoskeletal: No bony lesion is identified. Mild degenerative changes of the lumbar spine are seen. A lipoma is noted within the left iliopsoas muscle. IMPRESSION: Chronic changes without acute abnormality. No focal abnormality to correspond with the patient's given clinical symptomatology is noted. Electronically Signed   By: Inez Catalina M.D.   On: 10/18/2015 20:39  Dg Chest Portable 1 View  Result Date: 10/18/2015 CLINICAL DATA:  Chest pain. Hypo cavum the me a. Shortness of breath. EXAM: PORTABLE CHEST 1 VIEW COMPARISON:  10/05/2015 FINDINGS: The heart size and mediastinal contours are within normal limits. Both lungs are clear. The visualized skeletal structures are unremarkable. IMPRESSION: No active disease. Electronically Signed   By: Nelson Chimes M.D.   On: 10/18/2015 17:50   EKG: Independently reviewed. Sinus tachycardia  Assessment/Plan: Principal Problem:   Acute respiratory failure with hypoxia (HCC) Active Problems:   Lactic acidosis   Nausea and vomiting   Chronic back pain   COPD exacerbation ([place])   Sepsis ([place])    This patient was  discussed with the ED physician, including pertinent vitals, physical exam findings, labs, and imaging.  We  also discussed care given by the ED provider.  #1 acute respiratory failure with hypoxia  Admit  Continue oxygen therapy #2 COPD exacerbation  We'll administer steroids: Solu-Medrol 120 mg now and 60 mg every 12  Nebulizer treatments  Add azithromycin #3 lactic acidosis  Repeat lactic acid  Continue IV fluids #4 Sepsis  Qualifies for severe sepsis given white count, tachypnea, tachycardia, hypoxia  Continue septic antibiotics  Continue IV fluids  Question early pneumonia in addition to COPD exacerbation  No abdominal source for white count  Urine strep and Legionella antigen #5 nausea and vomiting  Symptomatic treatment  CT negative for abdominal process  Consider MRA of abdomen if continues to have nausea and vomiting #6 chronic back pain  Continue home regimen  DVT prophylaxis: Lovenox Consultants: None Code Status: Full code Family Communication: None  Disposition Plan: Likely discharged home   Truett Mainland, DO [facility] Pager [phone]  If 7PM-7AM, please contact night-coverage [email or web address] Password [email or web address]

## 2015-10-18 NOTE — Progress Notes (Signed)
ANTIBIOTIC CONSULT NOTE-Preliminary  Pharmacy Consult for Vancomycin Indication: sepsis  Allergies  Allergen Reactions  . Contrast Media [Iodinated Diagnostic Agents] Anaphylaxis    Needs 13-hour prep  . Shellfish Allergy Anaphylaxis  . Codeine Nausea And Vomiting and Other (See Comments)    Hallucinations, Also sees people that are not there   . Ivermectin Nausea And Vomiting    N/V and rash  . Betadine [Povidone Iodine] Rash  . Latex Hives  . Lipitor [Atorvastatin] Itching  . Permethrin Itching  . Tape Dermatitis    Patient Measurements: Height: 5' (152.4 cm) Weight: 230 lb (104.3 kg) IBW/kg (Calculated) : 45.5  Vital Signs: Temp: 99.5 F (37.5 C) (07/25 1657) Temp Source: Oral (07/25 1657) BP: 126/63 (07/25 1932) Pulse Rate: 113 (07/25 1932)  Labs:  Recent Labs  10/18/15 1707 10/18/15 1714  WBC 15.0*  --   HGB 12.0 12.2  PLT 314  --   CREATININE 0.98 0.90    Estimated Creatinine Clearance: 84.2 mL/min (by C-G formula based on SCr of 0.9 mg/dL).  No results for input(s): VANCOTROUGH, VANCOPEAK, VANCORANDOM, GENTTROUGH, GENTPEAK, GENTRANDOM, TOBRATROUGH, TOBRAPEAK, TOBRARND, AMIKACINPEAK, AMIKACINTROU, AMIKACIN in the last 72 hours.   Microbiology: No results found for this or any previous visit (from the past 720 hour(s)).  Medical History: Past Medical History:  Diagnosis Date  . Anxiety   . Arthritis   . Asthma   . Bipolar 1 disorder ([place])   . Colitis due to Clostridium difficile 2001  . GERD (gastroesophageal reflux disease)   . Hepatic hemangioma   . History of Salmonella gastroenteritis   . Hyperlipidemia    a. Noted 02/2014.  Marland Kitchen Hypertension    HCTZ started 07/2015  . IBS (irritable bowel syndrome)   . Iron deficiency anemia 05/2014   per pt it is not due to vaginal blood loss; hemoccults sent to pt in mail 413/16.  . Lumbar spondylosis    mild, mainly focused at L4-5 and L5-S1.  . Microscopic hematuria    Intermittent (no w/u done yet,  as of 03/2014)  . Migraine headache   . Morbid obesity ([place])   . Polycystic ovarian syndrome   . Pruritic dermatitis 2015/2016   +Scabies prep at [facility] 07/2014. (Primarily pruritic skin, but eventually a subtle rash as well)  . Rectal cancer ([place])    a. Followed by Dr. Gala Romney, dx 2000-2001. Tumor removed from rectal.  . Recurrent sinusitis    +allergic rhinitis  . Tobacco dependence   . Vitamin D deficiency     Assessment: 50yo obese female, good renal fxn.  Asked to initiate Vancomycin for sepsis.   Goal of Therapy:  Vancomycin trough level 15-20 mcg/ml  Plan:  Preliminary review of pertinent patient information completed.  Protocol will be initiated with Vancomycin 1000mg  IV q8hrs.   Forestine Na clinical pharmacist will complete review during morning rounds to assess patient and make changes if needed.  Hart Robinsons A,  10/18/2015,8:28 PM

## 2015-10-18 NOTE — ED Notes (Signed)
CRITICAL VALUE ALERT  Critical value received:  Lactic acid 4.4  Date of notification:  10/18/15  Time of notification:  2115  Critical value read back:Yes.    Nurse who received alert:  Toma Deiters  MD notified (1st page):  Dr Vincent Gros  Time of first page:  2116  MD notified (2nd page):  Time of second page:  Responding MD:  Dr Vincent Gros  Time MD responded:  2116

## 2015-10-18 NOTE — ED Notes (Signed)
MD at bedside. 

## 2015-10-18 NOTE — ED Provider Notes (Signed)
Emergency Department Provider Note   I have reviewed the triage vital signs and the nursing notes.   HISTORY  Chief Complaint Shortness of Breath   HPI Martha Espinoza is a 50 y.o. female with PMH of anxiety, bipolar, and chronic back pain resents to the emergency department for evaluation of difficulty breathing, severe abdominal cramping, lightheadedness, severe back pain in the setting of multiple episodes of vomiting today. The patient reports previous visits to the emergency department with similar symptoms. She states that she has had approximately 15 episodes of vomiting this morning that were associated with severe abdominal cramping. She was unable to drink or take any medications for nausea which she has been prescribed. Previously this was thought to be a reaction to ivermectin but the patient a longer takes this medication. She denies any chest pain but does have significant difficulty breathing. She denies any illicit drug use, concern for withdrawal, or alcohol use. She denies any unilateral weakness or numbness. No HA or neck stiffness.    Past Medical History:  Diagnosis Date  . Anxiety   . Arthritis   . Asthma   . Bipolar 1 disorder ([place])   . Colitis due to Clostridium difficile 2001  . GERD (gastroesophageal reflux disease)   . Hepatic hemangioma   . History of Salmonella gastroenteritis   . Hyperlipidemia    a. Noted 02/2014.  Marland Kitchen Hypertension    HCTZ started 07/2015  . IBS (irritable bowel syndrome)   . Iron deficiency anemia 05/2014   per pt it is not due to vaginal blood loss; hemoccults sent to pt in mail 413/16.  . Lumbar spondylosis    mild, mainly focused at L4-5 and L5-S1.  . Microscopic hematuria    Intermittent (no w/u done yet, as of 03/2014)  . Migraine headache   . Morbid obesity ([place])   . Polycystic ovarian syndrome   . Pruritic dermatitis 2015/2016   +Scabies prep at [facility] 07/2014. (Primarily pruritic skin, but eventually a  subtle rash as well)  . Rectal cancer ([place])    a. Followed by Dr. Gala Romney, dx 2000-2001. Tumor removed from rectal.  . Recurrent sinusitis    +allergic rhinitis  . Tobacco dependence   . Vitamin D deficiency     Patient Active Problem List   Diagnosis Date Noted  . Drug reaction 07/23/2015  . Fever of unknown origin 07/22/2015  . Lactic acidosis 07/22/2015  . Hypokalemia 07/22/2015  . Nausea and vomiting 07/22/2015  . Chronic back pain 07/22/2015  . Abdominal pain 07/26/2014  . Nausea without vomiting 07/26/2014  . History of colon cancer 07/26/2014  . Iron deficiency anemia 07/07/2014  . Vitamin D deficiency 07/07/2014  . Abnormal nuclear stress test 03/31/2014  . Abnormal cardiac CT angiography 03/31/2014  . Hyperlipidemia 03/24/2014  . Morbid obesity ([place])   . Chest pain of uncertain etiology [id number]  . COPD (chronic obstructive pulmonary disease) ([place]) 03/23/2014  . Leukocytosis 03/23/2014  . Hyperglycemia 03/23/2014  . Bipolar 1 disorder ([place]) 03/23/2014  . Pruritic rash 03/23/2014  . Chronic anxiety 03/23/2014  . Tobacco abuse 03/23/2014  . Intractable migraine without aura and with status migrainosus 01/14/2014  . Sinusitis 07/02/2013  . Urinary frequency 07/02/2013  . Migraine 07/02/2013  . Anxiety state, unspecified 04/14/2013  . Obesity 04/14/2013  . Anemia 04/14/2013  . CONSTIPATION 10/12/2009  . CARCINOMA IN SITU OF RECTUM 08/25/2009  . GERD 08/25/2009  . IRRITABLE BOWEL SYNDROME 08/25/2009  . DIARRHEA, CHRONIC 08/25/2009  .  HEMANGIOMA, HEPATIC 08/24/2009  . POLYCYSTIC OVARIAN DISEASE 08/24/2009  . Personal history of other diseases of digestive system 08/24/2009    Past Surgical History:  Procedure Laterality Date  . CARDIOVASCULAR STRESS TEST  03/24/14   Lexscan MIBI: mild anterior ischemia?  Cardiac CT angiogram recommended/done.  . CHOLECYSTECTOMY    . COLONOSCOPY  05/05/2007   adenoma  . coronary CT angio  03/29/14   Two vessel dz/moderate  stenosis of mid LAD and proximal RCA; cath recommended.  Marland Kitchen DILATION AND CURETTAGE OF UTERUS     for vaginal bleeding  . LEFT HEART CATHETERIZATION WITH CORONARY ANGIOGRAM N/A 03/31/2014   Normal coronaries, EF 0000000, diastolic dysfunction.  Procedure: LEFT HEART CATHETERIZATION WITH CORONARY ANGIOGRAM;  Surgeon: Sinclair Grooms, MD;  Location: [facility] CATH LAB;  Service: Cardiovascular;  Laterality: N/A;  . Situ removed  age 17   High-grade rectal adenoma removed from rectum   . TRANSTHORACIC ECHOCARDIOGRAM  03/23/14   Normal    Current Outpatient Rx  . Order #: [id number] Class: Normal  . Order #: [id number] Class: Print  . Order #: [id number] Class: Historical Med  . Order #: [id number] Class: Normal  . Order #: [id number] Class: Print  . Order #: [id number] Class: Normal  . Order #: [id number] Class: Print  . Order #: [id number] Class: Historical Med  . Order #: [id number] Class: Print  . Order #: [id number] Class: Normal  . Order #: [id number] Class: Normal  . Order #: [id number] Class: Print  . Order #: [id number] Class: Historical Med  . Order #: [id number] Class: Historical Med  . Order #: [id number] Class: Historical Med  . Order #: [id number] Class: Print  . Order #: [id number] Class: Normal  . Order #: [id number] Class: Normal  . Order #: [id number] Class: Print  . Order #: [id number] Class: Print    Allergies Contrast media [iodinated diagnostic agents]; Shellfish allergy; Codeine; Ivermectin; Betadine [povidone iodine]; Latex; Lipitor [atorvastatin]; Permethrin; and Tape  Family History  Problem Relation Age of Onset  . Colon cancer Father 98  . Cancer Father   . Diverticulitis Mother   . Heart disease Mother   . Hypertension Mother   . Hyperlipidemia Mother   . Mental illness Mother   . Diabetes Mother   . Heart attack Mother   . Stroke Neg Hx     Social History Social History  Substance Use Topics  . Smoking status: Current Every Day Smoker    Packs/day: 0.50    Years: 18.00    Types:  Cigarettes  . Smokeless tobacco: Never Used     Comment: 25 years  . Alcohol use No    Review of Systems  Constitutional: No fever/chills. Severe tremors Eyes: No visual changes. ENT: No sore throat. Cardiovascular: Denies chest pain. Respiratory: Positive shortness of breath. Gastrointestinal: Positive abdominal pain. Positive nausea and vomiting.  No diarrhea.  No constipation. Genitourinary: Negative for dysuria. Musculoskeletal: Positive for back pain. Skin: Negative for rash. Neurological: Negative for headaches, focal weakness or numbness.  10-point ROS otherwise negative.  ____________________________________________   PHYSICAL EXAM:  VITAL SIGNS: ED Triage Vitals  Enc Vitals Group     BP 10/18/15 1656 136/74     Pulse Rate 10/18/15 1656 (!) 128     Resp 10/18/15 1656 (!) 36     Temp 10/18/15 1657 99.5 F (37.5 C)     Temp Source 10/18/15 1656 Oral     SpO2 10/18/15 1656 92 %     Weight 10/18/15 1656 230 lb (104.3 kg)     Height 10/18/15 1656 5' (1.524 m)  Pain Score 10/18/15 1657 6   Constitutional: Alert but appears in severe distress. Tremulous, shouting, and somewhat altered requiring frequent redirection.  Eyes: Conjunctivae are normal. Pupils are 4 mm and reactive.  Head: Atraumatic. Nose: No congestion/rhinnorhea. Mouth/Throat: Mucous membranes are extremely dry.  Oropharynx non-erythematous. Neck: No stridor.  No meningeal signs.  Cardiovascular: Sinus tachycardia. Good peripheral circulation. Grossly normal heart sounds.   Respiratory: Increased respiratory effort.  No retractions. Lungs CTAB. Gastrointestinal: Soft and nontender. No distention. No rebound, guarding, or area of focal tenderness.  Musculoskeletal: No lower extremity tenderness nor edema. No gross deformities of extremities. Neurologic:  Normal speech and language. No gross focal neurologic deficits are appreciated.  Skin:  Skin is very warm to touch and flushed. Dry. No rash.    ____________________________________________   LABS (all labs ordered are listed, but only abnormal results are displayed)  Labs Reviewed  COMPREHENSIVE METABOLIC PANEL - Abnormal; Notable for the following:       Result Value   Sodium 132 (*)    Potassium 3.2 (*)    CO2 21 (*)    Glucose, Bld 152 (*)    Calcium 7.7 (*)    Total Protein 6.3 (*)    Albumin 3.4 (*)    ALT <5 (*)    All other components within normal limits  CBC WITH DIFFERENTIAL/PLATELET - Abnormal; Notable for the following:    WBC 15.0 (*)    HCT 35.4 (*)    Neutro Abs 12.6 (*)    Monocytes Absolute 1.2 (*)    All other components within normal limits  URINALYSIS, ROUTINE W REFLEX MICROSCOPIC (NOT AT [facility]) - Abnormal; Notable for the following:    Specific Gravity, Urine >1.030 (*)    Hgb urine dipstick MODERATE (*)    Protein, ur 30 (*)    All other components within normal limits  URINE RAPID DRUG SCREEN, HOSP PERFORMED - Abnormal; Notable for the following:    Opiates POSITIVE (*)    Benzodiazepines POSITIVE (*)    All other components within normal limits  LACTIC ACID, PLASMA - Abnormal; Notable for the following:    Lactic Acid, Venous 5.7 (*)    All other components within normal limits  LACTIC ACID, PLASMA - Abnormal; Notable for the following:    Lactic Acid, Venous 4.4 (*)    All other components within normal limits  BLOOD GAS, VENOUS - Abnormal; Notable for the following:    pH, Ven 7.393 (*)    pCO2, Ven 40.4 (*)    pO2, Ven 59.1 (*)    Bicarbonate 24.1 (*)    All other components within normal limits  URINE MICROSCOPIC-ADD ON - Abnormal; Notable for the following:    Squamous Epithelial / LPF TOO NUMEROUS TO COUNT (*)    Bacteria, UA FEW (*)    All other components within normal limits  CBC - Abnormal; Notable for the following:    Hemoglobin 11.2 (*)    HCT 33.1 (*)    All other components within normal limits  BASIC METABOLIC PANEL - Abnormal; Notable for the following:     Glucose, Bld 133 (*)    Calcium 8.2 (*)    All other components within normal limits  I-STAT CHEM 8, ED - Abnormal; Notable for the following:    Potassium 3.4 (*)    Chloride 99 (*)    Glucose, Bld 154 (*)    Calcium, Ion 1.01 (*)    All other components within normal limits  CULTURE, BLOOD (ROUTINE X 2)  CULTURE, BLOOD (ROUTINE X 2)  LACTIC ACID, PLASMA  STREP PNEUMONIAE URINARY ANTIGEN  LEGIONELLA PNEUMOPHILA SEROGP 1 UR AG  I-STAT TROPOININ, ED  POCT I-STAT TROPONIN I   ____________________________________________  EKG  Reviewed. No STEMI.  ____________________________________________  RADIOLOGY  Ct Abdomen Pelvis Wo Contrast  Result Date: 10/18/2015 CLINICAL DATA:  Abdominal pain with nausea and vomiting for 2 days EXAM: CT ABDOMEN AND PELVIS WITHOUT CONTRAST TECHNIQUE: Multidetector CT imaging of the abdomen and pelvis was performed following the standard protocol without IV contrast. COMPARISON:  09/23/2014 FINDINGS: Lower chest:  No acute findings. Hepatobiliary: The gallbladder has been surgically removed. The liver is within normal limits. Pancreas: No mass or inflammatory process identified on this un-enhanced exam. Spleen: Within normal limits in size. Adrenals/Urinary Tract: The adrenals are within normal limits. No renal calculi or obstructive changes are noted. A cyst is noted in the posterior lower pole of the left kidney measuring 2.2 cm. This has decreased in size from the prior exam a which time it measured 3.3 cm. Stomach/Bowel: No evidence of obstruction, inflammatory process, or abnormal fluid collections. The appendix is within normal limits. Vascular/Lymphatic: No pathologically enlarged lymph nodes. No evidence of abdominal aortic aneurysm. Reproductive: No mass or other significant abnormality. Other: None. Musculoskeletal: No bony lesion is identified. Mild degenerative changes of the lumbar spine are seen. A lipoma is noted within the left iliopsoas muscle.  IMPRESSION: Chronic changes without acute abnormality. No focal abnormality to correspond with the patient's given clinical symptomatology is noted. Electronically Signed   By: Inez Catalina M.D.   On: 10/18/2015 20:39  Dg Chest Portable 1 View  Result Date: 10/18/2015 CLINICAL DATA:  Chest pain. Hypo cavum the me a. Shortness of breath. EXAM: PORTABLE CHEST 1 VIEW COMPARISON:  10/05/2015 FINDINGS: The heart size and mediastinal contours are within normal limits. Both lungs are clear. The visualized skeletal structures are unremarkable. IMPRESSION: No active disease. Electronically Signed   By: Nelson Chimes M.D.   On: 10/18/2015 17:50   ____________________________________________   PROCEDURES  Procedure(s) performed:   Procedures  CRITICAL CARE Performed by: Margette Fast Total critical care time: 45 minutes Critical care time was exclusive of separately billable procedures and treating other patients. Critical care was necessary to treat or prevent imminent or life-threatening deterioration. Critical care was time spent personally by me on the following activities: development of treatment plan with patient and/or surrogate as well as nursing, discussions with consultants, evaluation of patient's response to treatment, examination of patient, obtaining history from patient or surrogate, ordering and performing treatments and interventions, ordering and review of laboratory studies, ordering and review of radiographic studies, pulse oximetry and re-evaluation of patient's condition.  Nanda Quinton, MD Emergency Medicine  ____________________________________________   INITIAL IMPRESSION / ASSESSMENT AND PLAN / ED COURSE  Pertinent labs & imaging results that were available during my care of the patient were reviewed by me and considered in my medical decision making (see chart for details).  Patient resents to the emergency department in severe distress. She is tremulous, extremely dry  mucous membranes, tachycardia, warm to touch, and difficult to carry on conversation with. She is complaining of back pain and cramping abdominal discomfort. Her abdomen is soft and nontender. Her lungs are clear to auscultation bilaterally. She is afebrile on arrival but breathing quickly with associated tachycardia. In review of Epic the patient had a similar presentation in April and was thought to be secondary to Ivermectin  use. She was started on antibiotics given elevated lactate and fever with no clear source. She had a VQ scan to evaluate for pulmonary embolism. The patient denies any new medications. She is not taking Ivermectin currently. She denies drinking or drug abuse. Normal pupil size.   Plan is to initially stabilize the patient with IV fluids, pain medication, labs. EKG reviewed with sinus tachycardia. No right axis or other signs and EKG to consider pulmonary embolism although this remains somewhat on my differential. No evidence of ischemia on EKG. Patient appears extremely dehydrated. Will require multiple IV fluid boluses, antibiotics, and frequent reassessment. Not clearly an anticholinergic toxidrome with no pupil changes. Also question acute detox.   07:22 PM Spoke to hospitalist and placed orders for admission. We will pursue CT scan of the abdomen with IV contrast considering mesenteric ischemia is a possibility. Have ordered hydrocortisone prior to CT with patient's listed contrast dye allergy.   07:32 PM Patient with anaphylactic reaction reported to contrast media. Discussed with her regarding the presentation. It was within the last 5 years. She describes acute swelling of her throat and airway with difficulty breathing. Do not feel I can safely premedicate her at this time but will discuss with the hospitalist. Plan for non-contrast CT to assess for gross signs of bowel ischemia and possibly MRI/MRA tomorrow.  ____________________________________________  FINAL CLINICAL  IMPRESSION(S) / ED DIAGNOSES  Final diagnoses:  SOB (shortness of breath)  Tachycardia     MEDICATIONS GIVEN DURING THIS VISIT:  Medications  sodium chloride 0.9 % bolus 1,000 mL (1,000 mLs Intravenous New Bag/Given 10/18/15 1743)  morphine 4 MG/ML injection 4 mg (4 mg Intravenous Given 10/18/15 1742)   Multiple IVF boluses  NEW OUTPATIENT MEDICATIONS STARTED DURING THIS VISIT:  None   Note:  This document was prepared using Dragon voice recognition software and may include unintentional dictation errors.  Nanda Quinton, MD Emergency Medicine   Margette Fast, MD 10/19/15 [phone]

## 2015-10-18 NOTE — ED Notes (Signed)
CRITICAL VALUE ALERT  Critical value received:  Lactic Acid 5.7  Date of notification:  10/18/2015  Time of notification:  1832  Critical value read back:Yes  Nurse who received alert:  Hinton Rao, RN   MD notified (1st page):  Dr. Laverta Baltimore

## 2015-10-19 DIAGNOSIS — E872 Acidosis: Secondary | ICD-10-CM

## 2015-10-19 DIAGNOSIS — J9601 Acute respiratory failure with hypoxia: Secondary | ICD-10-CM

## 2015-10-19 DIAGNOSIS — R112 Nausea with vomiting, unspecified: Secondary | ICD-10-CM

## 2015-10-19 DIAGNOSIS — J441 Chronic obstructive pulmonary disease with (acute) exacerbation: Principal | ICD-10-CM

## 2015-10-19 LAB — CBC
HCT: 33.1 % — ABNORMAL LOW (ref 36.0–46.0)
Hemoglobin: 11.2 g/dL — ABNORMAL LOW (ref 12.0–15.0)
MCH: 28.5 pg (ref 26.0–34.0)
MCHC: 33.8 g/dL (ref 30.0–36.0)
MCV: 84.2 fL (ref 78.0–100.0)
Platelets: 244 10*3/uL (ref 150–400)
RBC: 3.93 MIL/uL (ref 3.87–5.11)
RDW: 13.7 % (ref 11.5–15.5)
WBC: 9.3 10*3/uL (ref 4.0–10.5)

## 2015-10-19 LAB — STREP PNEUMONIAE URINARY ANTIGEN: Strep Pneumo Urinary Antigen: NEGATIVE

## 2015-10-19 LAB — BASIC METABOLIC PANEL
Anion gap: 8 (ref 5–15)
BUN: 10 mg/dL (ref 6–20)
CO2: 25 mmol/L (ref 22–32)
Calcium: 8.2 mg/dL — ABNORMAL LOW (ref 8.9–10.3)
Chloride: 107 mmol/L (ref 101–111)
Creatinine, Ser: 0.74 mg/dL (ref 0.44–1.00)
GFR calc Af Amer: >60 mL/min (ref >60)
GFR calc non Af Amer: >60 mL/min (ref >60)
Glucose, Bld: 133 mg/dL — ABNORMAL HIGH (ref 65–99)
Potassium: 4 mmol/L (ref 3.5–5.1)
Sodium: 140 mmol/L (ref 135–145)

## 2015-10-19 LAB — POCT I-STAT TROPONIN I: Troponin i, poc: 0 ng/mL (ref 0.00–0.08)

## 2015-10-19 MED ORDER — PROMETHAZINE HCL 25 MG PO TABS: 25.0000 mg | ORAL_TABLET | Freq: Four times a day (QID) | ORAL | 0 refills | Status: DC | PRN

## 2015-10-19 MED ORDER — AZITHROMYCIN 250 MG PO TABS: ORAL_TABLET | ORAL | 0 refills | Status: DC

## 2015-10-19 MED ORDER — ENOXAPARIN SODIUM 60 MG/0.6ML ~~LOC~~ SOLN: 50.0000 mg | SUBCUTANEOUS | Status: DC

## 2015-10-19 MED ORDER — PREDNISONE 10 MG PO TABS: ORAL_TABLET | ORAL | 0 refills | Status: DC

## 2015-10-19 MED ORDER — ALBUTEROL SULFATE (2.5 MG/3ML) 0.083% IN NEBU: 3.0000 mL | INHALATION_SOLUTION | Freq: Four times a day (QID) | RESPIRATORY_TRACT | 12 refills | Status: DC | PRN

## 2015-10-19 MED ORDER — PIPERACILLIN-TAZOBACTAM 3.375 G IVPB
3.3750 g | Freq: Three times a day (TID) | INTRAVENOUS | Status: DC
  Administered 2015-10-19 (×2): 3.375 g via INTRAVENOUS
  Filled 2015-10-19 (×2): qty 50

## 2015-10-19 NOTE — Progress Notes (Signed)
SATURATION QUALIFICATIONS: (This note is used to comply with regulatory documentation for home oxygen)  Patient Saturations on Room Air at Rest = 96  Patient Saturations on Room Air while Ambulating = 94   

## 2015-10-19 NOTE — Care Management Note (Signed)
Case Management Note  Patient Details  Name: Martha Espinoza MRN: [id number] Date of Birth: 03-30-1965  Subjective/Objective:                  Pt is from home, lives with her husband. Pt is ind with ADL's, has PCP and has transportation to appointments. Pt plans to return home today with self care. Pt needs neb machine. Pt provided list of DME agencies and has chosen [facility]. Romualdo Bolk, of [facility], made aware of referral and will obtain pt info from chart and deliver neb machine to pt room prior to DC.   Action/Plan: No further CM needs. Discharging home today.   Expected Discharge Date:  10/19/15               Expected Discharge Plan:  Home/Self Care  In-House Referral:  NA  Discharge planning Services  CM Consult  Post Acute Care Choice:  Durable Medical Equipment Choice offered to:  Patient  DME Arranged:  Nebulizer machine DME Agency:  [place]:    [facility] Agency:     Status of Service:  Completed, signed off  If discussed at [place] of Stay Meetings, dates discussed:    Additional Comments:  Sherald Barge, RN 10/19/2015, 12:57 PM

## 2015-10-19 NOTE — Progress Notes (Signed)
Pt's IV catheter removed and intact. Pt's IV site clean dry and intact. Discharge instructions including medications and follow up appointments were reviewed and discussed with patient. Pt verbalized understanding of discharge instructions including medications and follow up appointments. All questions were answered and no further questions at this time. Pt in stable condition and in no acute distress at time of discharge. Pt will be escorted by nurse tech.  

## 2015-10-19 NOTE — Discharge Summary (Signed)
Physician Discharge Summary  Martha Espinoza [id number] DOB: [birth year] DOA: 10/18/2015  PCP: Tammi Sou, MD  Admit date: 10/18/2015 Discharge date: 10/19/2015  Admitted From: home Disposition:  home  Recommendations for Outpatient Follow-up:  1. Follow up with PCP in 1-2 weeks 2. Please obtain BMP/CBC in one week 3. Follow up with pcp for further work up of transient intermittent flushing. Consider work up for carcinoid vs. pheochromocytoma   Home Health: Equipment/Devices: nebulizer  Discharge Condition: stable CODE STATUS: full code Diet recommendation: Heart Healthy   Brief/Interim Summary: This is a 50 year old female with a history of COPD who presents to the hospital with complaints of shortness of breath, nausea and vomiting. Patient was found to have significant chest pain and shortness of breath that began approximately 10 days ago. Shortness of breath was worse with activity and improved with rest. She also reported significant nausea and vomiting which resolved on arrival to the emergency room. Patient was felt to have COPD exacerbation and was treated with steroids, bronchodilators and antibiotics. Her shortness of breath has since improved and wheezing has resolved. She's no longer requiring any oxygen and is able to ambulate comfortably on room air. She has been transitioned to a prednisone taper. She'll be provided a nebulizer and discharged to continue bronchodilator treatments. She will also complete a course of antibiotics at home.  Her nausea and vomiting is likely related to viral syndrome precipitated her COPD exacerbation. Imaging of her abdomen was unremarkable. Her symptoms have since resolved and she is tolerating a solid diet without difficulty.  She was noted to have elevated lactic acid, tachycardia on admission there were concerns for sepsis. She was started on broad-spectrum antibiotics, she quickly improved and 24 hours and lactic acid improved with  hydration. On review, it is unlikely the patient had sepsis on admission and this was likely related to volume depletion/dehydration.  She does describe intermittent episodes of flushing. Etiology is not entirely clear. I recommended that she discuss this with her primary care physician on follow-up. Consider workup for carcinoid/pheochromocytoma as an outpatient.  Discharge Diagnoses:  Principal Problem:   Acute respiratory failure with hypoxia (HCC) Active Problems:   Lactic acidosis   Nausea and vomiting   Chronic back pain   COPD exacerbation [facility])      Discharge Instructions  Discharge Instructions    Diet - low sodium heart healthy    Complete by:  As directed   Increase activity slowly    Complete by:  As directed       Medication List    TAKE these medications   albuterol 108 (90 Base) MCG/ACT inhaler Commonly known as:  VENTOLIN HFA Inhale 2 puffs into the lungs every 6 (six) hours as needed for wheezing or shortness of breath. What changed:  Another medication with the same name was added. Make sure you understand how and when to take each.   albuterol (2.5 MG/3ML) 0.083% nebulizer solution Commonly known as:  PROVENTIL Inhale 3 mLs into the lungs every 6 (six) hours as needed for wheezing or shortness of breath. What changed:  You were already taking a medication with the same name, and this prescription was added. Make sure you understand how and when to take each.   aspirin 81 MG chewable tablet Chew 1 tablet (81 mg total) by mouth daily.   azithromycin 250 MG tablet Commonly known as:  ZITHROMAX Take 2 tablets on day 1 and then 1 tablet daily until complete   busPIRone 15  MG tablet Commonly known as:  BUSPAR Take 15-30 mg by mouth 2 (two) times daily. 1 in the morning and 2 at bedtime   Cetirizine HCl 10 MG Caps Commonly known as:  ZYRTEC ALLERGY Take 1 capsule (10 mg total) by mouth daily.   clonazePAM 0.5 MG tablet Commonly known as:   KLONOPIN TAKE ONE TABLET BY MOUTH DURING THE DAY AS NEEDED THEN TAKE TWO TABLETS DAILY AT BEDTIME   cyclobenzaprine 10 MG tablet Commonly known as:  FLEXERIL Take 1 tablet (10 mg total) by mouth every 6 (six) hours as needed (spasm related to headaches). What changed:  when to take this  additional instructions   diphenhydrAMINE 25 MG tablet Commonly known as:  BENADRYL Take 1 tablet (25 mg total) by mouth every 6 (six) hours. What changed:  when to take this  reasons to take this   fluticasone 50 MCG/ACT nasal spray Commonly known as:  FLONASE Place 2 sprays into both nostrils daily.   hydrochlorothiazide 25 MG tablet Commonly known as:  HYDRODIURIL TAKE ONE TABLET BY MOUTH ONCE DAILY   HYDROcodone-acetaminophen 5-325 MG tablet Commonly known as:  NORCO/VICODIN Take 1-2 tablets by mouth every 4 (four) hours as needed.   hydrOXYzine 25 MG tablet Commonly known as:  ATARAX/VISTARIL Take 25 mg by mouth 3 (three) times daily.   ibuprofen 200 MG tablet Commonly known as:  ADVIL,MOTRIN Take 600 mg by mouth every 6 (six) hours as needed for mild pain or moderate pain.   lamoTRIgine 200 MG tablet Commonly known as:  LAMICTAL Take 200 mg by mouth at bedtime.   nitroGLYCERIN 0.4 MG SL tablet Commonly known as:  NITROSTAT Place 1 tablet (0.4 mg total) under the tongue every 5 (five) minutes as needed for chest pain.   pantoprazole 40 MG tablet Commonly known as:  PROTONIX TAKE ONE TABLET BY MOUTH ONCE DAILY   polyethylene glycol powder powder Commonly known as:  GLYCOLAX/MIRALAX Take 17 g by mouth 2 (two) times daily as needed. What changed:  reasons to take this   potassium chloride SA 20 MEQ tablet Commonly known as:  K-DUR,KLOR-CON Take 1 tablet (20 mEq total) by mouth 2 (two) times daily.   predniSONE 10 MG tablet Commonly known as:  DELTASONE Take 40mg  daily for 2 days then 30mg  daily for 2 days then 20mg  daily for 2 days then 10mg  daily for 2 days    promethazine 25 MG tablet Commonly known as:  PHENERGAN Take 1 tablet (25 mg total) by mouth every 6 (six) hours as needed for nausea or vomiting.   STOOL SOFTENER PO Take 3 tablets by mouth at bedtime.       Allergies  Allergen Reactions  . Contrast Media [Iodinated Diagnostic Agents] Anaphylaxis    Needs 13-hour prep  . Shellfish Allergy Anaphylaxis  . Codeine Nausea And Vomiting and Other (See Comments)    Hallucinations, Also sees people that are not there   . Ivermectin Nausea And Vomiting    N/V and rash  . Betadine [Povidone Iodine] Rash  . Latex Hives  . Lipitor [Atorvastatin] Itching  . Permethrin Itching  . Tape Dermatitis    Consultations:     Procedures/Studies: Ct Abdomen Pelvis Wo Contrast  Result Date: 10/18/2015 CLINICAL DATA:  Abdominal pain with nausea and vomiting for 2 days EXAM: CT ABDOMEN AND PELVIS WITHOUT CONTRAST TECHNIQUE: Multidetector CT imaging of the abdomen and pelvis was performed following the standard protocol without IV contrast. COMPARISON:  09/23/2014 FINDINGS: Lower chest:  No acute findings. Hepatobiliary: The gallbladder has been surgically removed. The liver is within normal limits. Pancreas: No mass or inflammatory process identified on this un-enhanced exam. Spleen: Within normal limits in size. Adrenals/Urinary Tract: The adrenals are within normal limits. No renal calculi or obstructive changes are noted. A cyst is noted in the posterior lower pole of the left kidney measuring 2.2 cm. This has decreased in size from the prior exam a which time it measured 3.3 cm. Stomach/Bowel: No evidence of obstruction, inflammatory process, or abnormal fluid collections. The appendix is within normal limits. Vascular/Lymphatic: No pathologically enlarged lymph nodes. No evidence of abdominal aortic aneurysm. Reproductive: No mass or other significant abnormality. Other: None. Musculoskeletal: No bony lesion is identified. Mild degenerative changes  of the lumbar spine are seen. A lipoma is noted within the left iliopsoas muscle. IMPRESSION: Chronic changes without acute abnormality. No focal abnormality to correspond with the patient's given clinical symptomatology is noted. Electronically Signed   By: Inez Catalina M.D.   On: 10/18/2015 20:39  Dg Chest 2 View  Result Date: 10/05/2015 CLINICAL DATA:  50 year old female with chest pain for 4 days. EXAM: CHEST  2 VIEW COMPARISON:  07/22/2015 and prior exam FINDINGS: The cardiomediastinal silhouette is unremarkable. There is no evidence of focal airspace disease, pulmonary edema, suspicious pulmonary nodule/mass, pleural effusion, or pneumothorax. No acute bony abnormalities are identified. IMPRESSION: No active cardiopulmonary disease. Electronically Signed   By: Margarette Canada M.D.   On: 10/05/2015 19:32   Dg Chest Portable 1 View  Result Date: 10/18/2015 CLINICAL DATA:  Chest pain. Hypo cavum the me a. Shortness of breath. EXAM: PORTABLE CHEST 1 VIEW COMPARISON:  10/05/2015 FINDINGS: The heart size and mediastinal contours are within normal limits. Both lungs are clear. The visualized skeletal structures are unremarkable. IMPRESSION: No active disease. Electronically Signed   By: Nelson Chimes M.D.   On: 10/18/2015 17:50      Subjective: Feeling better. Shortness of breath has improved. No vomiting or abdominal pain  Discharge Exam: Vitals:   10/18/15 2211 10/19/15 0624  BP: 123/65 (!) 120/58  Pulse: (!) 111 (!) 104  Resp: (!) 24 (!) 22  Temp: 98.7 F (37.1 C) 98.1 F (36.7 C)   Vitals:   10/18/15 2211 10/19/15 0624 10/19/15 0742 10/19/15 1344  BP: 123/65 (!) 120/58    Pulse: (!) 111 (!) 104    Resp: (!) 24 (!) 22    Temp: 98.7 F (37.1 C) 98.1 F (36.7 C)    TempSrc: Oral Oral    SpO2: 91% 93% 93% 96%  Weight: 109 kg (240 lb 3.2 oz)     Height: 5' (1.524 m)       General: Pt is alert, awake, not in acute distress Cardiovascular: RRR, S1/S2 +, no rubs, no  gallops Respiratory: CTA bilaterally, no wheezing, no rhonchi Abdominal: Soft, NT, ND, bowel sounds + Extremities: no edema, no cyanosis    The results of significant diagnostics from this hospitalization (including imaging, microbiology, ancillary and laboratory) are listed below for reference.     Microbiology: Recent Results (from the past 240 hour(s))  Blood culture (routine x 2)     Status: None (Preliminary result)   Collection Time: 10/18/15  5:07 PM  Result Value Ref Range Status   Specimen Description BLOOD LEFT ARM  Final   Special Requests BOTTLES DRAWN AEROBIC AND ANAEROBIC 6CC  Final   Culture NO GROWTH < 24 HOURS  Final   Report Status PENDING  Incomplete  Blood culture (routine x 2)     Status: None (Preliminary result)   Collection Time: 10/18/15  5:18 PM  Result Value Ref Range Status   Specimen Description BLOOD RIGHT HAND  Final   Special Requests BOTTLES DRAWN AEROBIC AND ANAEROBIC 6CC  Final   Culture NO GROWTH < 24 HOURS  Final   Report Status PENDING  Incomplete     Labs: BNP (last 3 results)  Recent Labs  07/22/15 1025  BNP 1.0   Basic Metabolic Panel:  Recent Labs Lab 10/18/15 1707 10/18/15 1714 10/19/15 0527  NA 132* 136 140  K 3.2* 3.4* 4.0  CL 101 99* 107  CO2 21*  --  25  GLUCOSE 152* 154* 133*  BUN 12 10 10   CREATININE 0.98 0.90 0.74  CALCIUM 7.7*  --  8.2*   Liver Function Tests:  Recent Labs Lab 10/18/15 1707  AST 24  ALT <5*  ALKPHOS 42  BILITOT 0.6  PROT 6.3*  ALBUMIN 3.4*   No results for input(s): LIPASE, AMYLASE in the last 168 hours. No results for input(s): AMMONIA in the last 168 hours. CBC:  Recent Labs Lab 10/18/15 1707 10/18/15 1714 10/19/15 0527  WBC 15.0*  --  9.3  NEUTROABS 12.6*  --   --   HGB 12.0 12.2 11.2*  HCT 35.4* 36.0 33.1*  MCV 83.7  --  84.2  PLT 314  --  244   Cardiac Enzymes: No results for input(s): CKTOTAL, CKMB, CKMBINDEX, TROPONINI in the last 168 hours. BNP: Invalid  input(s): POCBNP CBG: No results for input(s): GLUCAP in the last 168 hours. D-Dimer No results for input(s): DDIMER in the last 72 hours. Hgb A1c No results for input(s): HGBA1C in the last 72 hours. Lipid Profile No results for input(s): CHOL, HDL, LDLCALC, TRIG, CHOLHDL, LDLDIRECT in the last 72 hours. Thyroid function studies No results for input(s): TSH, T4TOTAL, T3FREE, THYROIDAB in the last 72 hours.  Invalid input(s): FREET3 Anemia work up No results for input(s): VITAMINB12, FOLATE, FERRITIN, TIBC, IRON, RETICCTPCT in the last 72 hours. Urinalysis    Component Value Date/Time   COLORURINE YELLOW 10/18/2015 1851   APPEARANCEUR CLEAR 10/18/2015 1851   LABSPEC >1.030 (H) 10/18/2015 1851   PHURINE 6.0 10/18/2015 1851   GLUCOSEU NEGATIVE 10/18/2015 1851   GLUCOSEU NEGATIVE 04/02/2014 1500   HGBUR MODERATE (A) 10/18/2015 1851   BILIRUBINUR NEGATIVE 10/18/2015 1851   BILIRUBINUR Neg 08/07/2013 1551   KETONESUR NEGATIVE 10/18/2015 1851   PROTEINUR 30 (A) 10/18/2015 1851   UROBILINOGEN 0.2 09/23/2014 1830   NITRITE NEGATIVE 10/18/2015 1851   LEUKOCYTESUR NEGATIVE 10/18/2015 1851   Sepsis Labs Invalid input(s): PROCALCITONIN,  WBC,  LACTICIDVEN Microbiology Recent Results (from the past 240 hour(s))  Blood culture (routine x 2)     Status: None (Preliminary result)   Collection Time: 10/18/15  5:07 PM  Result Value Ref Range Status   Specimen Description BLOOD LEFT ARM  Final   Special Requests BOTTLES DRAWN AEROBIC AND ANAEROBIC 6CC  Final   Culture NO GROWTH < 24 HOURS  Final   Report Status PENDING  Incomplete  Blood culture (routine x 2)     Status: None (Preliminary result)   Collection Time: 10/18/15  5:18 PM  Result Value Ref Range Status   Specimen Description BLOOD RIGHT HAND  Final   Special Requests BOTTLES DRAWN AEROBIC AND ANAEROBIC 6CC  Final   Culture NO GROWTH < 24 HOURS  Final   Report Status  PENDING  Incomplete     Time coordinating discharge:  Over 30 minutes  SIGNED:   Kathie Dike, MD  [facility] 10/19/2015, 7:42 PM Pager [phone]  If 7PM-7AM, please contact night-coverage [email or web address] Password [email or web address]

## 2015-10-19 NOTE — Progress Notes (Signed)
ANTIBIOTIC CONSULT NOTE-follow up  Pharmacy Consult for Vancomycin Indication: sepsis  Allergies  Allergen Reactions  . Contrast Media [Iodinated Diagnostic Agents] Anaphylaxis    Needs 13-hour prep  . Shellfish Allergy Anaphylaxis  . Codeine Nausea And Vomiting and Other (See Comments)    Hallucinations, Also sees people that are not there   . Ivermectin Nausea And Vomiting    N/V and rash  . Betadine [Povidone Iodine] Rash  . Latex Hives  . Lipitor [Atorvastatin] Itching  . Permethrin Itching  . Tape Dermatitis   Patient Measurements: Height: 5' (152.4 cm) Weight: 240 lb 3.2 oz (109 kg) IBW/kg (Calculated) : 45.5  Vital Signs: Temp: 98.1 F (36.7 C) (07/26 0624) Temp Source: Oral (07/26 0624) BP: 120/58 (07/26 0624) Pulse Rate: 104 (07/26 0624)  Labs:  Recent Labs  10/18/15 1707 10/18/15 1714 10/19/15 0527  WBC 15.0*  --  9.3  HGB 12.0 12.2 11.2*  PLT 314  --  244  CREATININE 0.98 0.90 0.74   Estimated Creatinine Clearance: 97.3 mL/min (by C-G formula based on SCr of 0.8 mg/dL).  No results for input(s): VANCOTROUGH, VANCOPEAK, VANCORANDOM, GENTTROUGH, GENTPEAK, GENTRANDOM, TOBRATROUGH, TOBRAPEAK, TOBRARND, AMIKACINPEAK, AMIKACINTROU, AMIKACIN in the last 72 hours.   Microbiology: Recent Results (from the past 720 hour(s))  Blood culture (routine x 2)     Status: None (Preliminary result)   Collection Time: 10/18/15  5:07 PM  Result Value Ref Range Status   Specimen Description BLOOD LEFT ARM  Final   Special Requests BOTTLES DRAWN AEROBIC AND ANAEROBIC 6CC  Final   Culture PENDING  Incomplete   Report Status PENDING  Incomplete  Blood culture (routine x 2)     Status: None (Preliminary result)   Collection Time: 10/18/15  5:18 PM  Result Value Ref Range Status   Specimen Description BLOOD RIGHT HAND  Final   Special Requests BOTTLES DRAWN AEROBIC AND ANAEROBIC 6CC  Final   Culture PENDING  Incomplete   Report Status PENDING  Incomplete   Medical  History: Past Medical History:  Diagnosis Date  . Anxiety   . Arthritis   . Asthma   . Bipolar 1 disorder ([place])   . Colitis due to Clostridium difficile 2001  . GERD (gastroesophageal reflux disease)   . Hepatic hemangioma   . History of Salmonella gastroenteritis   . Hyperlipidemia    a. Noted 02/2014.  Marland Kitchen Hypertension    HCTZ started 07/2015  . IBS (irritable bowel syndrome)   . Iron deficiency anemia 05/2014   per pt it is not due to vaginal blood loss; hemoccults sent to pt in mail 413/16.  . Lumbar spondylosis    mild, mainly focused at L4-5 and L5-S1.  . Microscopic hematuria    Intermittent (no w/u done yet, as of 03/2014)  . Migraine headache   . Morbid obesity ([place])   . Polycystic ovarian syndrome   . Pruritic dermatitis 2015/2016   +Scabies prep at [facility] 07/2014. (Primarily pruritic skin, but eventually a subtle rash as well)  . Rectal cancer ([place])    a. Followed by Dr. Gala Romney, dx 2000-2001. Tumor removed from rectal.  . Recurrent sinusitis    +allergic rhinitis  . Tobacco dependence   . Vitamin D deficiency    Assessment: 50yo obese female, good renal fxn.  Pt c/o SOB.  Asked to initiate Vancomycin and Zosyn for sepsis.   Goal of Therapy:  Vancomycin trough level 15-20 mcg/ml   Vancomycin 7/25 >> Zosyn 7/25 >>  Zithromax 7/25 >>  Plan: Zithromax 500mg  IV q24hrs per MD Vancomycin 1gm IV q8hrs, goal trough 15-20 Zosyn 3.375gm IV q8h, EID Deescalate ABX when improved / appropriate Monitor labs, renal fxn, progress and c/s  Ena Dawley, RPH 10/19/2015,7:29 AM

## 2015-10-20 ENCOUNTER — Telehealth: Payer: Self-pay | Admitting: Family Medicine

## 2015-10-20 ENCOUNTER — Emergency Department (HOSPITAL_COMMUNITY): Payer: Commercial Managed Care - PPO

## 2015-10-20 ENCOUNTER — Encounter (HOSPITAL_COMMUNITY): Payer: Self-pay

## 2015-10-20 ENCOUNTER — Emergency Department (HOSPITAL_COMMUNITY)
Admission: EM | Admit: 2015-10-20 | Discharge: 2015-10-20 | Disposition: A | Discharge status: Home / Self Care | Payer: Commercial Managed Care - PPO | Attending: Emergency Medicine | Admitting: Emergency Medicine

## 2015-10-20 DIAGNOSIS — J441 Chronic obstructive pulmonary disease with (acute) exacerbation: Secondary | ICD-10-CM | POA: Insufficient documentation

## 2015-10-20 DIAGNOSIS — Z7982 Long term (current) use of aspirin: Secondary | ICD-10-CM | POA: Diagnosis not present

## 2015-10-20 DIAGNOSIS — Z85048 Personal history of other malignant neoplasm of rectum, rectosigmoid junction, and anus: Secondary | ICD-10-CM | POA: Insufficient documentation

## 2015-10-20 DIAGNOSIS — R519 Headache, unspecified: Secondary | ICD-10-CM

## 2015-10-20 DIAGNOSIS — Z85038 Personal history of other malignant neoplasm of large intestine: Secondary | ICD-10-CM | POA: Insufficient documentation

## 2015-10-20 DIAGNOSIS — J45909 Unspecified asthma, uncomplicated: Secondary | ICD-10-CM | POA: Diagnosis not present

## 2015-10-20 DIAGNOSIS — R112 Nausea with vomiting, unspecified: Secondary | ICD-10-CM | POA: Insufficient documentation

## 2015-10-20 DIAGNOSIS — E785 Hyperlipidemia, unspecified: Secondary | ICD-10-CM | POA: Diagnosis not present

## 2015-10-20 DIAGNOSIS — F319 Bipolar disorder, unspecified: Secondary | ICD-10-CM | POA: Diagnosis not present

## 2015-10-20 DIAGNOSIS — R51 Headache: Secondary | ICD-10-CM | POA: Insufficient documentation

## 2015-10-20 DIAGNOSIS — I10 Essential (primary) hypertension: Secondary | ICD-10-CM | POA: Diagnosis not present

## 2015-10-20 DIAGNOSIS — F17210 Nicotine dependence, cigarettes, uncomplicated: Secondary | ICD-10-CM | POA: Diagnosis not present

## 2015-10-20 LAB — CBC WITH DIFFERENTIAL/PLATELET
Basophils Absolute: 0 10*3/uL (ref 0.0–0.1)
Basophils Relative: 0 %
Eosinophils Absolute: 0 10*3/uL (ref 0.0–0.7)
Eosinophils Relative: 0 %
HCT: 33.7 % — ABNORMAL LOW (ref 36.0–46.0)
Hemoglobin: 11.3 g/dL — ABNORMAL LOW (ref 12.0–15.0)
Lymphocytes Relative: 24 %
Lymphs Abs: 2.7 10*3/uL (ref 0.7–4.0)
MCH: 28.1 pg (ref 26.0–34.0)
MCHC: 33.5 g/dL (ref 30.0–36.0)
MCV: 83.8 fL (ref 78.0–100.0)
Monocytes Absolute: 0.5 10*3/uL (ref 0.1–1.0)
Monocytes Relative: 4 %
Neutro Abs: 8.1 10*3/uL — ABNORMAL HIGH (ref 1.7–7.7)
Neutrophils Relative %: 72 %
Platelets: 285 10*3/uL (ref 150–400)
RBC: 4.02 MIL/uL (ref 3.87–5.11)
RDW: 13.9 % (ref 11.5–15.5)
WBC: 11.4 10*3/uL — ABNORMAL HIGH (ref 4.0–10.5)

## 2015-10-20 LAB — COMPREHENSIVE METABOLIC PANEL
ALT: 10 U/L — ABNORMAL LOW (ref 14–54)
AST: 12 U/L — ABNORMAL LOW (ref 15–41)
Albumin: 3.5 g/dL (ref 3.5–5.0)
Alkaline Phosphatase: 37 U/L — ABNORMAL LOW (ref 38–126)
Anion gap: 5 (ref 5–15)
BUN: 13 mg/dL (ref 6–20)
CO2: 25 mmol/L (ref 22–32)
Calcium: 8.6 mg/dL — ABNORMAL LOW (ref 8.9–10.3)
Chloride: 109 mmol/L (ref 101–111)
Creatinine, Ser: 0.7 mg/dL (ref 0.44–1.00)
GFR calc Af Amer: >60 mL/min (ref >60)
GFR calc non Af Amer: >60 mL/min (ref >60)
Glucose, Bld: 104 mg/dL — ABNORMAL HIGH (ref 65–99)
Potassium: 3 mmol/L — ABNORMAL LOW (ref 3.5–5.1)
Sodium: 139 mmol/L (ref 135–145)
Total Bilirubin: 0.3 mg/dL (ref 0.3–1.2)
Total Protein: 6.6 g/dL (ref 6.5–8.1)

## 2015-10-20 LAB — LEGIONELLA PNEUMOPHILA SEROGP 1 UR AG: L. pneumophila Serogp 1 Ur Ag: NEGATIVE

## 2015-10-20 MED ORDER — KETOROLAC TROMETHAMINE 30 MG/ML IJ SOLN
30.0000 mg | Freq: Once | INTRAMUSCULAR | Status: AC
  Administered 2015-10-20: 30 mg via INTRAVENOUS
  Filled 2015-10-20: qty 1

## 2015-10-20 MED ORDER — POTASSIUM CHLORIDE CRYS ER 20 MEQ PO TBCR
40.0000 meq | EXTENDED_RELEASE_TABLET | Freq: Once | ORAL | Status: AC
  Administered 2015-10-20: 40 meq via ORAL
  Filled 2015-10-20: qty 2

## 2015-10-20 MED ORDER — METOCLOPRAMIDE HCL 5 MG/ML IJ SOLN
10.0000 mg | Freq: Once | INTRAMUSCULAR | Status: AC
  Administered 2015-10-20: 10 mg via INTRAVENOUS
  Filled 2015-10-20: qty 2

## 2015-10-20 MED ORDER — DIPHENHYDRAMINE HCL 50 MG/ML IJ SOLN
25.0000 mg | Freq: Once | INTRAMUSCULAR | Status: AC
  Administered 2015-10-20: 25 mg via INTRAVENOUS
  Filled 2015-10-20: qty 1

## 2015-10-20 NOTE — ED Notes (Signed)
Pt states she was just d/c yesterday from hospital admission. Pt states that she has episodes that she turns red and hot, feels like her body is "on fire" then has intense headache, vomiting, and sometimes goes unresponsive. Pt has had these episodes prior without specific diagnosis. States she became concerned tonight because on 5 different occassions she became hot and red all over, had a headache and nausea for about 1 hour. Last used her nebulizer about 9:30 last night

## 2015-10-20 NOTE — Telephone Encounter (Signed)
Transition Care Management Follow-up Telephone Call   Date discharged? 10/20/15   How have you been since you were released from the hospital? Feeling okay   Do you understand why you were in the hospital? yes   Do you understand the discharge instructions? yes   Where were you discharged to? Home   Items Reviewed:  Medications reviewed: yes  Allergies reviewed: yes  Dietary changes reviewed: no  Referrals reviewed: yes   Functional Questionnaire:   Activities of Daily Living (ADLs):   She states they are independent in the following: none States they require assistance with the following: none   Any transportation issues/concerns?: no   Any patient concerns? Yes, she just wants to know why this keeps happening.    Confirmed importance and date/time of follow-up visits scheduled yes  Provider Appointment booked with  Confirmed with patient if condition begins to worsen call PCP or go to the ER.  Patient was given the office number and encouraged to call back with question or concerns.  : yes

## 2015-10-20 NOTE — ED Notes (Signed)
MD at bedside. 

## 2015-10-20 NOTE — ED Provider Notes (Addendum)
[facility] DEPT Provider Note   CSN: [id number] Arrival date & time: 10/20/15  0113  First Provider Contact:  None       History   Chief Complaint Chief Complaint  Patient presents with  . Migraine    HPI Martha Espinoza is a 50 y.o. female.  Patient presents with complaints of headache, nausea, vomiting and flushing. Patient reports that she has been having these episodes and recurrent manner for some time. She was hospitalized this week for this, went home this morning. During the workup, no obvious source for the symptoms were found.  Patient reports that tonight she started to feel intermittent episodes of flushing and her skin was turning red. She then developed a severe headache with nausea and vomiting. Symptoms are improved, no longer feeling any flushing. She still does have a global, throbbing headache.   The history is provided by the patient.  Migraine  Associated symptoms include headaches.    Past Medical History:  Diagnosis Date  . Anxiety   . Arthritis   . Asthma   . Bipolar 1 disorder ([place])   . Colitis due to Clostridium difficile 2001  . GERD (gastroesophageal reflux disease)   . Hepatic hemangioma   . History of Salmonella gastroenteritis   . Hyperlipidemia    a. Noted 02/2014.  Marland Kitchen Hypertension    HCTZ started 07/2015  . IBS (irritable bowel syndrome)   . Iron deficiency anemia 05/2014   per pt it is not due to vaginal blood loss; hemoccults sent to pt in mail 413/16.  . Lumbar spondylosis    mild, mainly focused at L4-5 and L5-S1.  . Microscopic hematuria    Intermittent (no w/u done yet, as of 03/2014)  . Migraine headache   . Morbid obesity ([place])   . Polycystic ovarian syndrome   . Pruritic dermatitis 2015/2016   +Scabies prep at [facility] 07/2014. (Primarily pruritic skin, but eventually a subtle rash as well)  . Rectal cancer ([place])    a. Followed by Dr. Gala Romney, dx 2000-2001. Tumor removed from rectal.  . Recurrent sinusitis      +allergic rhinitis  . Tobacco dependence   . Vitamin D deficiency     Patient Active Problem List   Diagnosis Date Noted  . Acute respiratory failure with hypoxia ([place]) 10/18/2015  . COPD exacerbation ([place]) 10/18/2015  . Drug reaction 07/23/2015  . Fever of unknown origin 07/22/2015  . Lactic acidosis 07/22/2015  . Hypokalemia 07/22/2015  . Nausea and vomiting 07/22/2015  . Chronic back pain 07/22/2015  . Abdominal pain 07/26/2014  . Nausea without vomiting 07/26/2014  . History of colon cancer 07/26/2014  . Iron deficiency anemia 07/07/2014  . Vitamin D deficiency 07/07/2014  . Abnormal nuclear stress test 03/31/2014  . Abnormal cardiac CT angiography 03/31/2014  . Hyperlipidemia 03/24/2014  . Morbid obesity ([place])   . Chest pain of uncertain etiology [id number]  . COPD (chronic obstructive pulmonary disease) ([place]) 03/23/2014  . Leukocytosis 03/23/2014  . Hyperglycemia 03/23/2014  . Bipolar 1 disorder ([place]) 03/23/2014  . Pruritic rash 03/23/2014  . Chronic anxiety 03/23/2014  . Tobacco abuse 03/23/2014  . Intractable migraine without aura and with status migrainosus 01/14/2014  . Sinusitis 07/02/2013  . Urinary frequency 07/02/2013  . Migraine 07/02/2013  . Anxiety state, unspecified 04/14/2013  . Obesity 04/14/2013  . Anemia 04/14/2013  . CONSTIPATION 10/12/2009  . CARCINOMA IN SITU OF RECTUM 08/25/2009  . GERD 08/25/2009  . IRRITABLE BOWEL SYNDROME 08/25/2009  .  DIARRHEA, CHRONIC 08/25/2009  . HEMANGIOMA, HEPATIC 08/24/2009  . POLYCYSTIC OVARIAN DISEASE 08/24/2009  . Personal history of other diseases of digestive system 08/24/2009    Past Surgical History:  Procedure Laterality Date  . CARDIOVASCULAR STRESS TEST  03/24/14   Lexscan MIBI: mild anterior ischemia?  Cardiac CT angiogram recommended/done.  . CHOLECYSTECTOMY    . COLONOSCOPY  05/05/2007   adenoma  . coronary CT angio  03/29/14   Two vessel dz/moderate stenosis of mid LAD and proximal RCA; cath  recommended.  Marland Kitchen DILATION AND CURETTAGE OF UTERUS     for vaginal bleeding  . LEFT HEART CATHETERIZATION WITH CORONARY ANGIOGRAM N/A 03/31/2014   Normal coronaries, EF 0000000, diastolic dysfunction.  Procedure: LEFT HEART CATHETERIZATION WITH CORONARY ANGIOGRAM;  Surgeon: Sinclair Grooms, MD;  Location: [facility] CATH LAB;  Service: Cardiovascular;  Laterality: N/A;  . Situ removed  age 56   High-grade rectal adenoma removed from rectum   . TRANSTHORACIC ECHOCARDIOGRAM  03/23/14   Normal    OB History    Gravida Para Term Preterm AB Living   4 1 1   3 1    SAB TAB Ectopic Multiple Live Births   3               Home Medications    Prior to Admission medications   Medication Sig Start Date End Date Taking? Authorizing Provider  albuterol (PROVENTIL) (2.5 MG/3ML) 0.083% nebulizer solution Inhale 3 mLs into the lungs every 6 (six) hours as needed for wheezing or shortness of breath. 10/19/15   Kathie Dike, MD  albuterol (VENTOLIN HFA) 108 (90 Base) MCG/ACT inhaler Inhale 2 puffs into the lungs every 6 (six) hours as needed for wheezing or shortness of breath. 05/04/15   Tammi Sou, MD  aspirin 81 MG chewable tablet Chew 1 tablet (81 mg total) by mouth daily. 09/06/12   Ripley Fraise, MD  azithromycin (ZITHROMAX) 250 MG tablet Take 2 tablets on day 1 and then 1 tablet daily until complete 10/19/15   Kathie Dike, MD  busPIRone (BUSPAR) 15 MG tablet Take 15-30 mg by mouth 2 (two) times daily. 1 in the morning and 2 at bedtime    Historical Provider, MD  Cetirizine HCl (ZYRTEC ALLERGY) 10 MG CAPS Take 1 capsule (10 mg total) by mouth daily. 12/17/14   Tammi Sou, MD  clonazePAM (KLONOPIN) 0.5 MG tablet TAKE ONE TABLET BY MOUTH DURING THE DAY AS NEEDED THEN TAKE TWO TABLETS DAILY AT BEDTIME 04/28/15   Tammi Sou, MD  cyclobenzaprine (FLEXERIL) 10 MG tablet Take 1 tablet (10 mg total) by mouth every 6 (six) hours as needed (spasm related to headaches). Patient taking differently: Take  10 mg by mouth at bedtime. Prescribed every 6 hours as needed for muscle spasms 06/03/15   Carole Civil, MD  diphenhydrAMINE (BENADRYL) 25 MG tablet Take 1 tablet (25 mg total) by mouth every 6 (six) hours. Patient taking differently: Take 25 mg by mouth every 6 (six) hours as needed for itching or allergies.  07/19/15   Fredia Sorrow, MD  Docusate Calcium (STOOL SOFTENER PO) Take 3 tablets by mouth at bedtime.    Historical Provider, MD  fluticasone (FLONASE) 50 MCG/ACT nasal spray Place 2 sprays into both nostrils daily. 08/24/14   Kristen N Ward, DO  hydrochlorothiazide (HYDRODIURIL) 25 MG tablet TAKE ONE TABLET BY MOUTH ONCE DAILY 09/26/15   Tammi Sou, MD  HYDROcodone-acetaminophen (NORCO/VICODIN) 5-325 MG tablet Take 1-2 tablets by mouth  every 4 (four) hours as needed. 10/05/15   Nat Christen, MD  hydrOXYzine (ATARAX/VISTARIL) 25 MG tablet Take 25 mg by mouth 3 (three) times daily.     Historical Provider, MD  ibuprofen (ADVIL,MOTRIN) 200 MG tablet Take 600 mg by mouth every 6 (six) hours as needed for mild pain or moderate pain.     Historical Provider, MD  lamoTRIgine (LAMICTAL) 200 MG tablet Take 200 mg by mouth at bedtime.    Historical Provider, MD  nitroGLYCERIN (NITROSTAT) 0.4 MG SL tablet Place 1 tablet (0.4 mg total) under the tongue every 5 (five) minutes as needed for chest pain. 03/25/14   Eugenie Filler, MD  pantoprazole (PROTONIX) 40 MG tablet TAKE ONE TABLET BY MOUTH ONCE DAILY 09/09/15   Tammi Sou, MD  polyethylene glycol powder (GLYCOLAX/MIRALAX) powder Take 17 g by mouth 2 (two) times daily as needed. Patient taking differently: Take 17 g by mouth 2 (two) times daily as needed for mild constipation or moderate constipation.  08/24/15   Tammi Sou, MD  potassium chloride SA (K-DUR,KLOR-CON) 20 MEQ tablet Take 1 tablet (20 mEq total) by mouth 2 (two) times daily. 10/05/15   Nat Christen, MD  predniSONE (DELTASONE) 10 MG tablet Take 40mg  daily for 2 days then 30mg   daily for 2 days then 20mg  daily for 2 days then 10mg  daily for 2 days 10/19/15   Kathie Dike, MD  promethazine (PHENERGAN) 25 MG tablet Take 1 tablet (25 mg total) by mouth every 6 (six) hours as needed for nausea or vomiting. 10/19/15   Kathie Dike, MD    Family History Family History  Problem Relation Age of Onset  . Colon cancer Father 25  . Cancer Father   . Diverticulitis Mother   . Heart disease Mother   . Hypertension Mother   . Hyperlipidemia Mother   . Mental illness Mother   . Diabetes Mother   . Heart attack Mother   . Stroke Neg Hx     Social History Social History  Substance Use Topics  . Smoking status: Current Every Day Smoker    Packs/day: 0.50    Years: 18.00    Types: Cigarettes  . Smokeless tobacco: Never Used     Comment: 25 years  . Alcohol use No     Allergies   Contrast media [iodinated diagnostic agents]; Shellfish allergy; Codeine; Ivermectin; Betadine [povidone iodine]; Latex; Lipitor [atorvastatin]; Permethrin; and Tape   Review of Systems Review of Systems  Gastrointestinal: Positive for nausea and vomiting.  Neurological: Positive for headaches.  All other systems reviewed and are negative.    Physical Exam Updated Vital Signs BP 130/85   Pulse 75   Temp 97.8 F (36.6 C) (Oral)   Resp 20   Ht 5' (1.524 m)   Wt 230 lb (104.3 kg)   LMP 10/02/2015   SpO2 98%   BMI 44.92 kg/m   Physical Exam  Constitutional: She is oriented to person, place, and time. She appears well-developed and well-nourished. No distress.  HENT:  Head: Normocephalic and atraumatic.  Right Ear: Hearing normal.  Left Ear: Hearing normal.  Nose: Nose normal.  Mouth/Throat: Oropharynx is clear and moist and mucous membranes are normal.  Eyes: Conjunctivae and EOM are normal. Pupils are equal, round, and reactive to light.  Neck: Normal range of motion. Neck supple.  Cardiovascular: Regular rhythm, S1 normal and S2 normal.  Exam reveals no gallop and no  friction rub.   No murmur heard. Pulmonary/Chest:  Effort normal and breath sounds normal. No respiratory distress. She exhibits no tenderness.  Abdominal: Soft. Normal appearance and bowel sounds are normal. There is no hepatosplenomegaly. There is no tenderness. There is no rebound, no guarding, no tenderness at McBurney's point and negative Murphy's sign. No hernia.  Musculoskeletal: Normal range of motion.  Neurological: She is alert and oriented to person, place, and time. She has normal strength. No cranial nerve deficit or sensory deficit. Coordination normal. GCS eye subscore is 4. GCS verbal subscore is 5. GCS motor subscore is 6.  Skin: Skin is warm, dry and intact. No rash noted. No cyanosis.  Psychiatric: She has a normal mood and affect. Her speech is normal and behavior is normal. Thought content normal.  Nursing note and vitals reviewed.    ED Treatments / Results  Labs (all labs ordered are listed, but only abnormal results are displayed) Labs Reviewed  CBC WITH DIFFERENTIAL/PLATELET - Abnormal; Notable for the following:       Result Value   WBC 11.4 (*)    Hemoglobin 11.3 (*)    HCT 33.7 (*)    Neutro Abs 8.1 (*)    All other components within normal limits  COMPREHENSIVE METABOLIC PANEL - Abnormal; Notable for the following:    Potassium 3.0 (*)    Glucose, Bld 104 (*)    Calcium 8.6 (*)    AST 12 (*)    ALT 10 (*)    Alkaline Phosphatase 37 (*)    All other components within normal limits    EKG  EKG Interpretation  Date/Time:  Thursday October 20 2015 01:58:29 EDT Ventricular Rate:  93 PR Interval:  138 QRS Duration: 92 QT Interval:  346 QTC Calculation: 430 R Axis:   2 Text Interpretation:  Normal sinus rhythm Possible Anterior infarct , age undetermined Abnormal ECG No significant change since last tracing Confirmed by Dacotah Cabello  MD, Kinsley Nicklaus [phone]) on 10/20/2015 3:43:13 AM       Radiology Ct Abdomen Pelvis Wo Contrast  Result Date:  10/18/2015 CLINICAL DATA:  Abdominal pain with nausea and vomiting for 2 days EXAM: CT ABDOMEN AND PELVIS WITHOUT CONTRAST TECHNIQUE: Multidetector CT imaging of the abdomen and pelvis was performed following the standard protocol without IV contrast. COMPARISON:  09/23/2014 FINDINGS: Lower chest:  No acute findings. Hepatobiliary: The gallbladder has been surgically removed. The liver is within normal limits. Pancreas: No mass or inflammatory process identified on this un-enhanced exam. Spleen: Within normal limits in size. Adrenals/Urinary Tract: The adrenals are within normal limits. No renal calculi or obstructive changes are noted. A cyst is noted in the posterior lower pole of the left kidney measuring 2.2 cm. This has decreased in size from the prior exam a which time it measured 3.3 cm. Stomach/Bowel: No evidence of obstruction, inflammatory process, or abnormal fluid collections. The appendix is within normal limits. Vascular/Lymphatic: No pathologically enlarged lymph nodes. No evidence of abdominal aortic aneurysm. Reproductive: No mass or other significant abnormality. Other: None. Musculoskeletal: No bony lesion is identified. Mild degenerative changes of the lumbar spine are seen. A lipoma is noted within the left iliopsoas muscle. IMPRESSION: Chronic changes without acute abnormality. No focal abnormality to correspond with the patient's given clinical symptomatology is noted. Electronically Signed   By: Inez Catalina M.D.   On: 10/18/2015 20:39  Ct Head Wo Contrast  Result Date: 10/20/2015 CLINICAL DATA:  Initial evaluation for acute headache. EXAM: CT HEAD WITHOUT CONTRAST TECHNIQUE: Contiguous axial images were obtained from the  base of the skull through the vertex without intravenous contrast. COMPARISON:  Prior CT from 02/07/2013. FINDINGS: There is no acute intracranial hemorrhage or infarct. No mass lesion or midline shift. Gray-white matter differentiation is well maintained. Ventricles are  normal in size without evidence of hydrocephalus. CSF containing spaces are within normal limits. No extra-axial fluid collection. The calvarium is intact. Orbital soft tissues are within normal limits. The paranasal sinuses and mastoid air cells are well pneumatized and free of fluid. Scalp soft tissues are unremarkable. IMPRESSION: Negative head CT.  No acute intracranial process identified. Electronically Signed   By: Jeannine Boga M.D.   On: 10/20/2015 04:51  Dg Chest Portable 1 View  Result Date: 10/18/2015 CLINICAL DATA:  Chest pain. Hypo cavum the me a. Shortness of breath. EXAM: PORTABLE CHEST 1 VIEW COMPARISON:  10/05/2015 FINDINGS: The heart size and mediastinal contours are within normal limits. Both lungs are clear. The visualized skeletal structures are unremarkable. IMPRESSION: No active disease. Electronically Signed   By: Nelson Chimes M.D.   On: 10/18/2015 17:50   Procedures Procedures (including critical care time)  Medications Ordered in ED Medications  potassium chloride SA (K-DUR,KLOR-CON) CR tablet 40 mEq (not administered)  ketorolac (TORADOL) 30 MG/ML injection 30 mg (30 mg Intravenous Given 10/20/15 0207)  metoCLOPramide (REGLAN) injection 10 mg (10 mg Intravenous Given 10/20/15 0208)  diphenhydrAMINE (BENADRYL) injection 25 mg (25 mg Intravenous Given 10/20/15 0208)     Initial Impression / Assessment and Plan / ED Course  I have reviewed the triage vital signs and the nursing notes.  Pertinent labs & imaging results that were available during my care of the patient were reviewed by me and considered in my medical decision making (see chart for details).  Clinical Course  Value Comment By Time  Sodium: 139 (Reviewed) Orpah Greek, MD 07/27 [phone]  Potassium: (!) 3.0 Given oral potssium during visit Orpah Greek, MD 07/27 [phone]  CT HEAD WO CONTRAST (Reviewed) Orpah Greek, MD 07/27 [phone]  CT HEAD WO CONTRAST (Reviewed) Orpah Greek, MD 07/27 [phone]  CT HEAD WO CONTRAST (Reviewed) Orpah Greek, MD 07/27 [phone]   Patient returns with concerns over continued headaches. Patient was just hospitalized for similar symptoms and had a negative workup. She was discharged and told to follow-up with her primary doctor to rule out carcinoid syndrome and pheochromocytoma. These entities seem to be unlikely. She did have a CT abdomen and pelvis performed during the hospitalization that was normal. She is not significantly hypertensive here in the ER. Repeat lab work is unremarkable. Patient had not had any imaging of her head performed, did have a CT head performed here in the ER which is normal. Etiology of her headaches, dizziness and flushing is unclear, but she does not appear to have an acute medical condition that would require repeat hospitalization. She was treated with migraine cocktail with improvement of her headache and follow-up with primary doctor.  Final Clinical Impressions(s) / ED Diagnoses   Final diagnoses:  Headache, unspecified headache type    New Prescriptions New Prescriptions   No medications on file     Orpah Greek, MD 10/20/15 [place], MD 10/20/15 [phone]

## 2015-10-23 LAB — CULTURE, BLOOD (ROUTINE X 2)
Culture: NO GROWTH
Culture: NO GROWTH

## 2015-10-24 ENCOUNTER — Encounter: Payer: Self-pay | Admitting: Family Medicine

## 2015-10-24 ENCOUNTER — Ambulatory Visit (INDEPENDENT_AMBULATORY_CARE_PROVIDER_SITE_OTHER): Payer: Commercial Managed Care - PPO | Admitting: Family Medicine

## 2015-10-24 VITALS — BP 114/82 | HR 105 | Temp 98.5°F | Resp 16 | Ht 60.0 in | Wt 235.5 lb

## 2015-10-24 DIAGNOSIS — R232 Flushing: Secondary | ICD-10-CM

## 2015-10-24 DIAGNOSIS — E876 Hypokalemia: Secondary | ICD-10-CM | POA: Diagnosis not present

## 2015-10-24 DIAGNOSIS — B070 Plantar wart: Secondary | ICD-10-CM | POA: Diagnosis not present

## 2015-10-24 DIAGNOSIS — R404 Transient alteration of awareness: Secondary | ICD-10-CM | POA: Diagnosis not present

## 2015-10-24 MED ORDER — IMIQUIMOD 5 % EX CREA: TOPICAL_CREAM | CUTANEOUS | 3 refills | Status: DC

## 2015-10-24 NOTE — Progress Notes (Signed)
Pre visit review using our clinic review tool, if applicable. No additional management support is needed unless otherwise documented below in the visit note. 

## 2015-10-24 NOTE — Progress Notes (Signed)
10/24/2015  CC:  Chief Complaint  Patient presents with  . Hospitalization Follow-up    TCM    Patient is a 50 y.o. Caucasian female who presents for  hospital follow up, specifically [facility] face-to-face visit. Dates hospitalized: 7/25-7/26, 2017.  Also returned to ED 10/20/15. Days since d/c from hospital: 5 Patient was discharged from hospital to home. Reason for admission to hospital: acute hypoxic resp failure from COPD exac plus n/v. She also had recurrent flushing that was concerning and it was recommended that she f/u with me for this in order to consider ruling out carcinoid syndrome and/or pheochromocytoma. Date of interactive (phone) contact with patient and/or caregiver:10/20/15.  I have reviewed patient's discharge summary plus pertinent specific notes, labs, and imaging from the hospitalization.    Pt essentially states that the d/c summary is inaccurate at best. Onset about 2 mo ago, she describes recurrent episodes of what starts out as a feeling of burning inside and then skin turns hot and "blood red"---no palpable rash.  As this goes on she gets a HA and generalized body aches, she starts vomiting and then goes into a state of altered mental status, gets combative, says things she doesn't recall.  This altered mental status continues in the hospital for a while.  She denies abdominal pain.  No diarrhea.  No fevers.  She gets taken to the hospital each time (4 times now).  CT abd/pelv 10/18/15 was essentially normal.  General lab work has been normal except low K, which is essentially blamed on her HCTZ.  She has stopped her HCTZ. She gets IVF and pain med in hosp.  Pt says she has altered mental status when these occur.  BP's are not particularly high.  On one recent occasion, an EDP did a head CT on her and it was unremarkable.  Medication reconciliation was done today and patient is taking meds as recommended by discharging hospitalist/specialist.     PMH:  Past Medical History:  Diagnosis Date  . Anxiety   . Arthritis   . Asthma   . Bipolar 1 disorder ([place])   . Colitis due to Clostridium difficile 2001  . GERD (gastroesophageal reflux disease)   . Hepatic hemangioma   . History of Salmonella gastroenteritis   . Hyperlipidemia    a. Noted 02/2014.  Marland Kitchen Hypertension    HCTZ started 07/2015  . IBS (irritable bowel syndrome)   . Iron deficiency anemia 05/2014   per pt it is not due to vaginal blood loss; hemoccults sent to pt in mail 413/16.  . Lumbar spondylosis    mild, mainly focused at L4-5 and L5-S1.  . Microscopic hematuria    Intermittent (no w/u done yet, as of 03/2014)  . Migraine headache   . Morbid obesity ([place])   . Polycystic ovarian syndrome   . Pruritic dermatitis 2015/2016   +Scabies prep at [facility] 07/2014. (Primarily pruritic skin, but eventually a subtle rash as well)  . Rectal cancer ([place])    a. Followed by Dr. Gala Romney, dx 2000-2001. Tumor removed from rectal.  . Recurrent sinusitis    +allergic rhinitis  . Tobacco dependence   . Vitamin D deficiency     PSH:  Past Surgical History:  Procedure Laterality Date  . CARDIOVASCULAR STRESS TEST  03/24/14   Lexscan MIBI: mild anterior ischemia?  Cardiac CT angiogram recommended/done.  . CHOLECYSTECTOMY    . COLONOSCOPY  05/05/2007   adenoma  . coronary CT angio  03/29/14  Two vessel dz/moderate stenosis of mid LAD and proximal RCA; cath recommended.  Marland Kitchen DILATION AND CURETTAGE OF UTERUS     for vaginal bleeding  . LEFT HEART CATHETERIZATION WITH CORONARY ANGIOGRAM N/A 03/31/2014   Normal coronaries, EF 0000000, diastolic dysfunction.  Procedure: LEFT HEART CATHETERIZATION WITH CORONARY ANGIOGRAM;  Surgeon: Sinclair Grooms, MD;  Location: [facility] CATH LAB;  Service: Cardiovascular;  Laterality: N/A;  . Situ removed  age 74   High-grade rectal adenoma removed from rectum   . TRANSTHORACIC ECHOCARDIOGRAM  03/23/14   Normal    MEDS:  Outpatient  Medications Prior to Visit  Medication Sig Dispense Refill  . albuterol (PROVENTIL) (2.5 MG/3ML) 0.083% nebulizer solution Inhale 3 mLs into the lungs every 6 (six) hours as needed for wheezing or shortness of breath. 75 mL 12  . albuterol (VENTOLIN HFA) 108 (90 Base) MCG/ACT inhaler Inhale 2 puffs into the lungs every 6 (six) hours as needed for wheezing or shortness of breath. 1 Inhaler 1  . aspirin 81 MG chewable tablet Chew 1 tablet (81 mg total) by mouth daily. 14 tablet 0  . azithromycin (ZITHROMAX) 250 MG tablet Take 2 tablets on day 1 and then 1 tablet daily until complete 6 each 0  . busPIRone (BUSPAR) 15 MG tablet Take 15-30 mg by mouth 2 (two) times daily. 1 in the morning and 2 at bedtime    . Cetirizine HCl (ZYRTEC ALLERGY) 10 MG CAPS Take 1 capsule (10 mg total) by mouth daily. 30 capsule 11  . clonazePAM (KLONOPIN) 0.5 MG tablet TAKE ONE TABLET BY MOUTH DURING THE DAY AS NEEDED THEN TAKE TWO TABLETS DAILY AT BEDTIME 90 tablet 5  . cyclobenzaprine (FLEXERIL) 10 MG tablet Take 1 tablet (10 mg total) by mouth every 6 (six) hours as needed (spasm related to headaches). (Patient taking differently: Take 10 mg by mouth at bedtime. Prescribed every 6 hours as needed for muscle spasms) 42 tablet 5  . diphenhydrAMINE (BENADRYL) 25 MG tablet Take 1 tablet (25 mg total) by mouth every 6 (six) hours. (Patient taking differently: Take 25 mg by mouth every 6 (six) hours as needed for itching or allergies. ) 20 tablet 0  . Docusate Calcium (STOOL SOFTENER PO) Take 3 tablets by mouth at bedtime.    . fluticasone (FLONASE) 50 MCG/ACT nasal spray Place 2 sprays into both nostrils daily. 16 g 1  . ibuprofen (ADVIL,MOTRIN) 200 MG tablet Take 600 mg by mouth every 6 (six) hours as needed for mild pain or moderate pain.     Marland Kitchen lamoTRIgine (LAMICTAL) 200 MG tablet Take 200 mg by mouth at bedtime.    . nitroGLYCERIN (NITROSTAT) 0.4 MG SL tablet Place 1 tablet (0.4 mg total) under the tongue every 5 (five)  minutes as needed for chest pain. 20 tablet 0  . pantoprazole (PROTONIX) 40 MG tablet TAKE ONE TABLET BY MOUTH ONCE DAILY 90 tablet 3  . polyethylene glycol powder (GLYCOLAX/MIRALAX) powder Take 17 g by mouth 2 (two) times daily as needed. (Patient taking differently: Take 17 g by mouth 2 (two) times daily as needed for mild constipation or moderate constipation. ) 3350 g 6  . predniSONE (DELTASONE) 10 MG tablet Take 40mg  daily for 2 days then 30mg  daily for 2 days then 20mg  daily for 2 days then 10mg  daily for 2 days 20 tablet 0  . promethazine (PHENERGAN) 25 MG tablet Take 1 tablet (25 mg total) by mouth every 6 (six) hours as needed for nausea or  vomiting. 30 tablet 0  . hydrOXYzine (ATARAX/VISTARIL) 25 MG tablet Take 25 mg by mouth 3 (three) times daily.     . hydrochlorothiazide (HYDRODIURIL) 25 MG tablet TAKE ONE TABLET BY MOUTH ONCE DAILY (Patient not taking: Reported on 10/24/2015) 30 tablet 11  . HYDROcodone-acetaminophen (NORCO/VICODIN) 5-325 MG tablet Take 1-2 tablets by mouth every 4 (four) hours as needed. (Patient not taking: Reported on 10/24/2015) 15 tablet 0  . potassium chloride SA (K-DUR,KLOR-CON) 20 MEQ tablet Take 1 tablet (20 mEq total) by mouth 2 (two) times daily. (Patient not taking: Reported on 10/24/2015) 28 tablet 0   No facility-administered medications prior to visit.   EXAM: BP 114/82 (BP Location: Left Arm, Patient Position: Sitting, Cuff Size: Large)   Pulse (!) 105   Temp 98.5 F (36.9 C) (Oral)   Resp 16   Ht 5' (1.524 m)   Wt 235 lb 8 oz (106.8 kg)   LMP 10/02/2015   SpO2 95%   BMI 45.99 kg/m  Gen: Alert, well appearing.  Patient is oriented to person, place, time, and situation. ENT: Ears: EACs clear, normal epithelium.  TMs with good light reflex and landmarks bilaterally.  Eyes: no injection, icteris, swelling, or exudate.  EOMI, PERRLA. Nose: no drainage or turbinate edema/swelling.  No injection or focal lesion.  Mouth: lips without lesion/swelling.   Oral mucosa pink and moist.  Dentition intact and without obvious caries or gingival swelling.  Oropharynx without erythema, exudate, or swelling.  Neck - No masses or thyromegaly or limitation in range of motion CV: RRR, no m/r/g.   LUNGS: CTA bilat, nonlabored resps, good aeration in all lung fields. ABD: soft, NT, ND, BS normal.  No hepatospenomegaly or mass.  No bruits. EXT: no clubbing, cyanosis, or edema.  Skin - no sores or suspicious lesions or rashes or color changes. Plantar surface of left foot shows 2 large plantar warts. Neuro: CN 2-12 intact bilaterally, strength 5/5 in proximal and distal upper extremities and lower extremities bilaterally.   No tremor.  FNF normal bilat.  No ataxia.  Upper extremity and lower extremity DTRs symmetric.  No pronator drift.   Pertinent labs/imaging Lab Results  Component Value Date   TSH 0.462 03/23/2014   Lab Results  Component Value Date   WBC 11.4 (H) 10/20/2015   HGB 11.3 (L) 10/20/2015   HCT 33.7 (L) 10/20/2015   MCV 83.8 10/20/2015   PLT 285 10/20/2015   Lab Results  Component Value Date   CREATININE 0.70 10/20/2015   BUN 13 10/20/2015   NA 139 10/20/2015   K 3.0 (L) 10/20/2015   CL 109 10/20/2015   CO2 25 10/20/2015   Lab Results  Component Value Date   ALT 10 (L) 10/20/2015   AST 12 (L) 10/20/2015   ALKPHOS 37 (L) 10/20/2015   BILITOT 0.3 10/20/2015   Lab Results  Component Value Date   CHOL 263 (H) 03/24/2014   Lab Results  Component Value Date   HDL 47 03/24/2014   Lab Results  Component Value Date   LDLCALC 169 (H) 03/24/2014   Lab Results  Component Value Date   TRIG 237 (H) 03/24/2014   Lab Results  Component Value Date   CHOLHDL 5.6 03/24/2014   ASSESSMENT/PLAN:  1) Episodic flushing, body pain, n/v, and altered mental status.  Unclear etiology. Will check serum catecholamines as well as 24h urine catecholamines to eval further for pheochromocytoma. Additionally, will check 24h urine 5-HIAA to  further evaluate for carcinoid. Will  also check TSH. Anticipate need for referral to endocrinologist in near future for further evaluation and management.  2) Hypokalemia: this was from her hctz and was exacerbated by her n/v. Recheck BMET today.  She'll remain off hctz.  Monitor bp at home---no new bp med started today.  3) Left foot plantar warts: pt wanted to try aldara cream so I rx'd this today.  An After Visit Summary was printed and given to the patient.  Medical decision making of high complexity was used today.  FOLLOW UP:  To be determined based on results of w/u  Signed:  Crissie Sickles, MD           10/24/2015

## 2015-10-25 LAB — BASIC METABOLIC PANEL
BUN: 14 mg/dL (ref 6–23)
CO2: 23 mEq/L (ref 19–32)
Calcium: 8.9 mg/dL (ref 8.4–10.5)
Chloride: 106 mEq/L (ref 96–112)
Creatinine, Ser: 0.8 mg/dL (ref 0.40–1.20)
GFR: 81.43 mL/min (ref >60.00)
Glucose, Bld: 106 mg/dL — ABNORMAL HIGH (ref 70–99)
Potassium: 4.4 mEq/L (ref 3.5–5.1)
Sodium: 138 mEq/L (ref 135–145)

## 2015-10-25 LAB — TSH: TSH: 0.39 u[IU]/mL (ref 0.35–4.50)

## 2015-10-28 LAB — CATECHOLAMINES, FRACTIONATED, PLASMA
Catecholamines, Total: 558 pg/mL
Epinephrine: 50 pg/mL
Norepinephrine: 508 pg/mL

## 2015-10-30 LAB — 5 HIAA, QUANTITATIVE, URINE, 24 HOUR: 5-HIAA, 24 Hr Urine: 4.5 mg/24 h (ref <=6.0)

## 2015-10-30 LAB — CATECHOLAMINES, FRACTIONATED, URINE, 24 HOUR
Calculated Total (E+NE): 87 mcg/24 h (ref 26–121)
Creatinine, Urine mg/day-CATEUR: 1.41 g/(24.h) (ref 0.63–2.50)
Dopamine, 24 hr Urine: 341 mcg/24 h (ref 52–480)
Epinephrine, 24 hr Urine: 6 mcg/24 h (ref 2–24)
Norepinephrine, 24 hr Ur: 81 mcg/24 h (ref 15–100)
Total Volume - CF 24Hr U: 900 mL

## 2015-11-01 ENCOUNTER — Other Ambulatory Visit: Payer: Self-pay | Admitting: Family Medicine

## 2015-11-01 NOTE — Telephone Encounter (Signed)
RF request for clonazepam LOV: 10/24/15 Next ov: None Last written: 04/28/15 #90 w/ 5RF  Please advise. Thanks.

## 2015-11-02 NOTE — Telephone Encounter (Signed)
Faxed Rx to pharmacy  

## 2015-11-08 ENCOUNTER — Encounter: Payer: Self-pay | Admitting: Family Medicine

## 2015-11-09 ENCOUNTER — Telehealth: Payer: Self-pay | Admitting: *Deleted

## 2015-11-09 DIAGNOSIS — R232 Flushing: Secondary | ICD-10-CM

## 2015-11-09 NOTE — Telephone Encounter (Signed)
Pt LMOM on 11/09/15 at 12:32pm stating that she has been waiting to here back about a referral.   I spoke to pt and she stated that at her last ov with Dr. Anitra Lauth he had told her he was going to refer her to endocrinology due to her recent spells. She stated that she is fine with seeing Dr. Dorris Fetch at [facility].   I advised her that Dr. Anitra Lauth would put in the order and she would either hear back from [place] or Dr. Liliane Channel office. She voiced understanding.   Please put in order. Thanks.

## 2015-11-09 NOTE — Telephone Encounter (Signed)
OK, referral to Dr. Dorris Fetch ordered.

## 2015-11-23 ENCOUNTER — Ambulatory Visit (INDEPENDENT_AMBULATORY_CARE_PROVIDER_SITE_OTHER): Payer: Commercial Managed Care - PPO | Admitting: Family Medicine

## 2015-11-23 ENCOUNTER — Encounter: Payer: Self-pay | Admitting: Family Medicine

## 2015-11-23 VITALS — BP 127/88 | HR 105 | Temp 98.3°F | Resp 16 | Ht 62.0 in | Wt 234.0 lb

## 2015-11-23 DIAGNOSIS — L918 Other hypertrophic disorders of the skin: Secondary | ICD-10-CM

## 2015-11-23 DIAGNOSIS — G2581 Restless legs syndrome: Secondary | ICD-10-CM

## 2015-11-23 MED ORDER — ROPINIROLE HCL 0.25 MG PO TABS: ORAL_TABLET | ORAL | 1 refills | Status: DC

## 2015-11-23 NOTE — Progress Notes (Signed)
OFFICE VISIT  11/23/2015   CC:  Chief Complaint  Patient presents with  . Leg Swelling    spot on leg right itching   HPI:    Patient is a 50 y.o. Caucasian female who presents for an itchy spot on back of right thigh. Has been present for a couple of years, gradually growing, pedunculated.  Now is big enough to irritate her when she sits. She would like it excised.  She also complains of lifelong RLS that is worse lately, bothers her every night and keeps her from sleeping a fair amount. She had ferritin check a few months ago and it was low normal, Hb low- normal.  She is not currently on any iron.    Past Medical History:  Diagnosis Date  . Anxiety   . Arthritis   . Asthma   . Bipolar 1 disorder ([place])   . Colitis due to Clostridium difficile 2001  . GERD (gastroesophageal reflux disease)   . Hepatic hemangioma   . History of Salmonella gastroenteritis   . Hyperlipidemia    a. Noted 02/2014.  Marland Kitchen Hypertension    HCTZ started 07/2015  . IBS (irritable bowel syndrome)   . Iron deficiency anemia 05/2014   per pt it is not due to vaginal blood loss; hemoccults sent to pt in mail 413/16.  . Lumbar spondylosis    mild, mainly focused at L4-5 and L5-S1.  . Microscopic hematuria    Intermittent (no w/u done yet, as of 03/2014)  . Migraine headache   . Morbid obesity ([place])   . Polycystic ovarian syndrome   . Pruritic dermatitis 2015/2016   +Scabies prep at [facility] 07/2014. (Primarily pruritic skin, but eventually a subtle rash as well)  . Rectal cancer ([place])    a. Followed by Dr. Gala Romney, dx 2000-2001. Tumor removed from rectal.  . Recurrent sinusitis    +allergic rhinitis  . Tobacco dependence   . Vitamin D deficiency     Past Surgical History:  Procedure Laterality Date  . CARDIOVASCULAR STRESS TEST  03/24/14   Lexscan MIBI: mild anterior ischemia?  Cardiac CT angiogram recommended/done.  . CHOLECYSTECTOMY    . COLONOSCOPY  05/05/2007   adenoma  . coronary  CT angio  03/29/14   Two vessel dz/moderate stenosis of mid LAD and proximal RCA; cath recommended.  Marland Kitchen DILATION AND CURETTAGE OF UTERUS     for vaginal bleeding  . LEFT HEART CATHETERIZATION WITH CORONARY ANGIOGRAM N/A 03/31/2014   Normal coronaries, EF 0000000, diastolic dysfunction.  Procedure: LEFT HEART CATHETERIZATION WITH CORONARY ANGIOGRAM;  Surgeon: Sinclair Grooms, MD;  Location: [facility] CATH LAB;  Service: Cardiovascular;  Laterality: N/A;  . Situ removed  age 33   High-grade rectal adenoma removed from rectum   . TRANSTHORACIC ECHOCARDIOGRAM  03/23/14   Normal    Outpatient Medications Prior to Visit  Medication Sig Dispense Refill  . albuterol (PROVENTIL) (2.5 MG/3ML) 0.083% nebulizer solution Inhale 3 mLs into the lungs every 6 (six) hours as needed for wheezing or shortness of breath. 75 mL 12  . albuterol (VENTOLIN HFA) 108 (90 Base) MCG/ACT inhaler Inhale 2 puffs into the lungs every 6 (six) hours as needed for wheezing or shortness of breath. 1 Inhaler 1  . aspirin 81 MG chewable tablet Chew 1 tablet (81 mg total) by mouth daily. 14 tablet 0  . busPIRone (BUSPAR) 15 MG tablet Take 15-30 mg by mouth 2 (two) times daily. 1 in the morning and 2  at bedtime    . Cetirizine HCl (ZYRTEC ALLERGY) 10 MG CAPS Take 1 capsule (10 mg total) by mouth daily. 30 capsule 11  . clonazePAM (KLONOPIN) 0.5 MG tablet TAKE ONE TABLET BY MOUTH ONCE DAILY DURING THE DAY AS NEEDED, THEN TWO TABS DAILY AT BEDTIME 90 tablet 5  . cyclobenzaprine (FLEXERIL) 10 MG tablet Take 1 tablet (10 mg total) by mouth every 6 (six) hours as needed (spasm related to headaches). (Patient taking differently: Take 10 mg by mouth at bedtime. Prescribed every 6 hours as needed for muscle spasms) 42 tablet 5  . diphenhydrAMINE (BENADRYL) 25 MG tablet Take 1 tablet (25 mg total) by mouth every 6 (six) hours. (Patient taking differently: Take 25 mg by mouth every 6 (six) hours as needed for itching or allergies. ) 20 tablet 0  .  Docusate Calcium (STOOL SOFTENER PO) Take 3 tablets by mouth at bedtime.    . fluticasone (FLONASE) 50 MCG/ACT nasal spray Place 2 sprays into both nostrils daily. 16 g 1  . hydrOXYzine (ATARAX/VISTARIL) 25 MG tablet Take 25 mg by mouth 3 (three) times daily.     Marland Kitchen ibuprofen (ADVIL,MOTRIN) 200 MG tablet Take 600 mg by mouth every 6 (six) hours as needed for mild pain or moderate pain.     Marland Kitchen lamoTRIgine (LAMICTAL) 200 MG tablet Take 200 mg by mouth at bedtime.    . nitroGLYCERIN (NITROSTAT) 0.4 MG SL tablet Place 1 tablet (0.4 mg total) under the tongue every 5 (five) minutes as needed for chest pain. 20 tablet 0  . pantoprazole (PROTONIX) 40 MG tablet TAKE ONE TABLET BY MOUTH ONCE DAILY 90 tablet 3  . polyethylene glycol powder (GLYCOLAX/MIRALAX) powder Take 17 g by mouth 2 (two) times daily as needed. (Patient taking differently: Take 17 g by mouth 2 (two) times daily as needed for mild constipation or moderate constipation. ) 3350 g 6  . promethazine (PHENERGAN) 25 MG tablet Take 1 tablet (25 mg total) by mouth every 6 (six) hours as needed for nausea or vomiting. 30 tablet 0  . imiquimod (ALDARA) 5 % cream Apply topically 3 (three) times a week. (Patient not taking: Reported on 11/23/2015) 12 each 3  . azithromycin (ZITHROMAX) 250 MG tablet Take 2 tablets on day 1 and then 1 tablet daily until complete (Patient not taking: Reported on 11/23/2015) 6 each 0  . predniSONE (DELTASONE) 10 MG tablet Take 40mg  daily for 2 days then 30mg  daily for 2 days then 20mg  daily for 2 days then 10mg  daily for 2 days (Patient not taking: Reported on 11/23/2015) 20 tablet 0   No facility-administered medications prior to visit.     Allergies  Allergen Reactions  . Contrast Media [Iodinated Diagnostic Agents] Anaphylaxis    Needs 13-hour prep  . Shellfish Allergy Anaphylaxis  . Codeine Nausea And Vomiting and Other (See Comments)    Hallucinations, Also sees people that are not there   . Ivermectin Nausea And  Vomiting    N/V and rash  . Betadine [Povidone Iodine] Rash  . Latex Hives  . Lipitor [Atorvastatin] Itching  . Permethrin Itching  . Tape Dermatitis    ROS As per HPI  PE: Blood pressure 127/88, pulse (!) 105, temperature 98.3 F (36.8 C), temperature source Oral, resp. rate 16, height 5\' 2"  (1.575 m), weight 234 lb (106.1 kg), SpO2 95 %. Gen: Alert, well appearing.  Patient is oriented to person, place, time, and situation. Right hamstring region with approx 2 cm pedunculated,  flesh colored nodule that is non-tender.  No erythema.  LABS:  None today.  Lab Results  Component Value Date   WBC 11.4 (H) 10/20/2015   HGB 11.3 (L) 10/20/2015   HCT 33.7 (L) 10/20/2015   MCV 83.8 10/20/2015   PLT 285 10/20/2015   Lab Results  Component Value Date   FERRITIN 22.0 08/24/2015   Lab Results  Component Value Date   IRON 42 06/22/2014   FERRITIN 22.0 08/24/2015    IMPRESSION AND PLAN:  1) Skin lesion: suspect large acrochordon. Will excise in office today.  Procedure: Consent obtained.  Pt allergic to latex and betadine, so I had to use non-latex gloves and I cleaned the area with a mix of alcohol and hydrogen peroxide.  Used sterile technique to excise the 2 cm pedunculated nodule at its base, elliptical shaped excision made.  Specimen put in specimen container and will be sent to path. I used 3 interrupted 4-0 ethilon sutures to close her wound.  Pt tolerated procedure well.  No immediate complications. Wound care instructions given.  2) Restless legs syndrome: I recommended she restart FeSO4 325mg  qd. I will also start her on ropinirole 0.25mg , 1 qhs, increase by 1 tab every 5 days to max of 2 mg.  An After Visit Summary was printed and given to the patient.  FOLLOW UP: Return in about 1 week (around 11/30/2015) for suture removal.  Signed:  Crissie Sickles, MD           11/23/2015

## 2015-12-01 ENCOUNTER — Encounter: Payer: Self-pay | Admitting: Family Medicine

## 2015-12-01 ENCOUNTER — Ambulatory Visit (INDEPENDENT_AMBULATORY_CARE_PROVIDER_SITE_OTHER): Payer: Commercial Managed Care - PPO | Admitting: Family Medicine

## 2015-12-01 VITALS — BP 134/83 | HR 102 | Temp 98.0°F | Resp 16 | Ht 62.0 in | Wt 233.0 lb

## 2015-12-01 DIAGNOSIS — J069 Acute upper respiratory infection, unspecified: Secondary | ICD-10-CM

## 2015-12-01 DIAGNOSIS — Z4802 Encounter for removal of sutures: Secondary | ICD-10-CM | POA: Diagnosis not present

## 2015-12-01 MED ORDER — AMOXICILLIN 875 MG PO TABS: 875.0000 mg | ORAL_TABLET | Freq: Two times a day (BID) | ORAL | 0 refills | Status: AC

## 2015-12-01 NOTE — Progress Notes (Signed)
OFFICE VISIT  12/01/2015   CC:  Chief Complaint  Patient presents with  . Suture / Staple Removal    right thigh  . Cough    headache, fullness,      HPI:    Patient is a 50 y.o. Caucasian female who presents for suture removal--I removed a lesion from the back of her left thigh a week ago.  Path showed benign fibrolipoma.  Says the area is itching some but otherwise has been fine.  Also reports 4d hx of nasal/sinus congestion/ pressure, PND, greenish thick mucous, cough.  No SOB, no wheezing. Trying some generic otc antihist/decong/tylenol.  ibup for HA.  No fevers. Has hx of recurrent sinusitis.   Past Medical History:  Diagnosis Date  . Anxiety   . Arthritis   . Asthma   . Bipolar 1 disorder ([place])   . Colitis due to Clostridium difficile 2001  . GERD (gastroesophageal reflux disease)   . Hepatic hemangioma   . History of Salmonella gastroenteritis   . Hyperlipidemia    a. Noted 02/2014.  Marland Kitchen Hypertension    HCTZ started 07/2015  . IBS (irritable bowel syndrome)   . Iron deficiency anemia 05/2014   per pt it is not due to vaginal blood loss; hemoccults sent to pt in mail 413/16.  . Lumbar spondylosis    mild, mainly focused at L4-5 and L5-S1.  . Microscopic hematuria    Intermittent (no w/u done yet, as of 03/2014)  . Migraine headache   . Morbid obesity ([place])   . Polycystic ovarian syndrome   . Pruritic dermatitis 2015/2016   +Scabies prep at [facility] 07/2014. (Primarily pruritic skin, but eventually a subtle rash as well)  . Rectal cancer ([place])    a. Followed by Dr. Gala Romney, dx 2000-2001. Tumor removed from rectal.  . Recurrent sinusitis    +allergic rhinitis  . Tobacco dependence   . Vitamin D deficiency     Past Surgical History:  Procedure Laterality Date  . CARDIOVASCULAR STRESS TEST  03/24/14   Lexscan MIBI: mild anterior ischemia?  Cardiac CT angiogram recommended/done.  . CHOLECYSTECTOMY    . COLONOSCOPY  05/05/2007   adenoma  . coronary CT  angio  03/29/14   Two vessel dz/moderate stenosis of mid LAD and proximal RCA; cath recommended.  Marland Kitchen DILATION AND CURETTAGE OF UTERUS     for vaginal bleeding  . LEFT HEART CATHETERIZATION WITH CORONARY ANGIOGRAM N/A 03/31/2014   Normal coronaries, EF 0000000, diastolic dysfunction.  Procedure: LEFT HEART CATHETERIZATION WITH CORONARY ANGIOGRAM;  Surgeon: Sinclair Grooms, MD;  Location: [facility] CATH LAB;  Service: Cardiovascular;  Laterality: N/A;  . Situ removed  age 15   High-grade rectal adenoma removed from rectum   . TRANSTHORACIC ECHOCARDIOGRAM  03/23/14   Normal    Outpatient Medications Prior to Visit  Medication Sig Dispense Refill  . albuterol (PROVENTIL) (2.5 MG/3ML) 0.083% nebulizer solution Inhale 3 mLs into the lungs every 6 (six) hours as needed for wheezing or shortness of breath. 75 mL 12  . albuterol (VENTOLIN HFA) 108 (90 Base) MCG/ACT inhaler Inhale 2 puffs into the lungs every 6 (six) hours as needed for wheezing or shortness of breath. 1 Inhaler 1  . aspirin 81 MG chewable tablet Chew 1 tablet (81 mg total) by mouth daily. 14 tablet 0  . busPIRone (BUSPAR) 15 MG tablet Take 15-30 mg by mouth 2 (two) times daily. 1 in the morning and 2 at bedtime    .  Cetirizine HCl (ZYRTEC ALLERGY) 10 MG CAPS Take 1 capsule (10 mg total) by mouth daily. 30 capsule 11  . clonazePAM (KLONOPIN) 0.5 MG tablet TAKE ONE TABLET BY MOUTH ONCE DAILY DURING THE DAY AS NEEDED, THEN TWO TABS DAILY AT BEDTIME 90 tablet 5  . cyclobenzaprine (FLEXERIL) 10 MG tablet Take 1 tablet (10 mg total) by mouth every 6 (six) hours as needed (spasm related to headaches). (Patient taking differently: Take 10 mg by mouth at bedtime. Prescribed every 6 hours as needed for muscle spasms) 42 tablet 5  . diphenhydrAMINE (BENADRYL) 25 MG tablet Take 1 tablet (25 mg total) by mouth every 6 (six) hours. (Patient taking differently: Take 25 mg by mouth every 6 (six) hours as needed for itching or allergies. ) 20 tablet 0  . Docusate  Calcium (STOOL SOFTENER PO) Take 3 tablets by mouth at bedtime.    . fluticasone (FLONASE) 50 MCG/ACT nasal spray Place 2 sprays into both nostrils daily. 16 g 1  . hydrOXYzine (ATARAX/VISTARIL) 25 MG tablet Take 25 mg by mouth 3 (three) times daily.     Marland Kitchen ibuprofen (ADVIL,MOTRIN) 200 MG tablet Take 600 mg by mouth every 6 (six) hours as needed for mild pain or moderate pain.     Marland Kitchen imiquimod (ALDARA) 5 % cream Apply topically 3 (three) times a week. 12 each 3  . lamoTRIgine (LAMICTAL) 200 MG tablet Take 200 mg by mouth at bedtime.    . nitroGLYCERIN (NITROSTAT) 0.4 MG SL tablet Place 1 tablet (0.4 mg total) under the tongue every 5 (five) minutes as needed for chest pain. 20 tablet 0  . pantoprazole (PROTONIX) 40 MG tablet TAKE ONE TABLET BY MOUTH ONCE DAILY 90 tablet 3  . polyethylene glycol powder (GLYCOLAX/MIRALAX) powder Take 17 g by mouth 2 (two) times daily as needed. (Patient taking differently: Take 17 g by mouth 2 (two) times daily as needed for mild constipation or moderate constipation. ) 3350 g 6  . potassium chloride SA (K-DUR,KLOR-CON) 20 MEQ tablet Take 20 mEq by mouth 2 (two) times daily.  0  . promethazine (PHENERGAN) 25 MG tablet Take 1 tablet (25 mg total) by mouth every 6 (six) hours as needed for nausea or vomiting. 30 tablet 0  . rOPINIRole (REQUIP) 0.25 MG tablet 1 tab po qhs.  Increase by one tab every 5 days to max of 2 mg 90 tablet 1   No facility-administered medications prior to visit.     Allergies  Allergen Reactions  . Contrast Media [Iodinated Diagnostic Agents] Anaphylaxis    Needs 13-hour prep  . Shellfish Allergy Anaphylaxis  . Codeine Nausea And Vomiting and Other (See Comments)    Hallucinations, Also sees people that are not there   . Ivermectin Nausea And Vomiting    N/V and rash  . Betadine [Povidone Iodine] Rash  . Latex Hives  . Lipitor [Atorvastatin] Itching  . Permethrin Itching  . Tape Dermatitis    Paper tape and clear    ROS As per  HPI  PE: Blood pressure 134/83, pulse (!) 102, temperature 98 F (36.7 C), temperature source Oral, resp. rate 16, height 5\' 2"  (1.575 m), weight 233 lb (105.7 kg), last menstrual period 11/27/2015, SpO2 96 %. VS: noted--normal. Gen: alert, NAD, NONTOXIC APPEARING. HEENT: eyes without injection, drainage, or swelling.  Ears: EACs clear, TMs with normal light reflex and landmarks.  Nose: Clear rhinorrhea, with some dried, crusty exudate adherent to mildly injected mucosa.  No purulent d/c.  No  paranasal sinus TTP.  No facial swelling.  Throat and mouth without focal lesion.  No pharyngial swelling, erythema, or exudate.   Neck: supple, no LAD.   LUNGS: CTA bilat, nonlabored resps.   CV: RRR, no m/r/g. EXT: no c/c/e SKIN: no rash. Back of left thigh: small sutured wound with edges well approximated and no erythema, drainage, or swelling.  Nontender. Sutures x 3 removed w/out problem.  LABS:  none  IMPRESSION AND PLAN:  1) Suture removal, wound looks great.  Lesion removed last week was benign.  2) URI: viral vs acute bacterial sinusitis. Discussed risks/benefits of antibiotic use in this kind of clinical scenario.  I agreed to send in amoxil 875mg  bid x 10d but I asked her to consider waiting 1-2 more days before filling it in order to see if this runs the course of a viral URI and starts improving.  She made no promises.  An After Visit Summary was printed and given to the patient.  FOLLOW UP: Return if symptoms worsen or fail to improve.  Signed:  Crissie Sickles, MD           12/01/2015

## 2015-12-02 ENCOUNTER — Ambulatory Visit: Payer: Commercial Managed Care - PPO | Admitting: Family Medicine

## 2015-12-09 ENCOUNTER — Emergency Department (HOSPITAL_COMMUNITY): Payer: Commercial Managed Care - PPO

## 2015-12-09 ENCOUNTER — Encounter (HOSPITAL_COMMUNITY): Payer: Self-pay

## 2015-12-09 ENCOUNTER — Emergency Department (HOSPITAL_COMMUNITY)
Admission: EM | Admit: 2015-12-09 | Discharge: 2015-12-09 | Disposition: A | Discharge status: Home / Self Care | Payer: Commercial Managed Care - PPO | Attending: Emergency Medicine | Admitting: Emergency Medicine

## 2015-12-09 DIAGNOSIS — R51 Headache: Secondary | ICD-10-CM | POA: Insufficient documentation

## 2015-12-09 DIAGNOSIS — Z792 Long term (current) use of antibiotics: Secondary | ICD-10-CM | POA: Diagnosis not present

## 2015-12-09 DIAGNOSIS — Y929 Unspecified place or not applicable: Secondary | ICD-10-CM | POA: Insufficient documentation

## 2015-12-09 DIAGNOSIS — F17210 Nicotine dependence, cigarettes, uncomplicated: Secondary | ICD-10-CM | POA: Insufficient documentation

## 2015-12-09 DIAGNOSIS — J45909 Unspecified asthma, uncomplicated: Secondary | ICD-10-CM | POA: Diagnosis not present

## 2015-12-09 DIAGNOSIS — J449 Chronic obstructive pulmonary disease, unspecified: Secondary | ICD-10-CM | POA: Diagnosis not present

## 2015-12-09 DIAGNOSIS — S1093XA Contusion of unspecified part of neck, initial encounter: Secondary | ICD-10-CM | POA: Insufficient documentation

## 2015-12-09 DIAGNOSIS — S20219A Contusion of unspecified front wall of thorax, initial encounter: Secondary | ICD-10-CM | POA: Diagnosis not present

## 2015-12-09 DIAGNOSIS — I10 Essential (primary) hypertension: Secondary | ICD-10-CM | POA: Diagnosis not present

## 2015-12-09 DIAGNOSIS — Z79899 Other long term (current) drug therapy: Secondary | ICD-10-CM | POA: Diagnosis not present

## 2015-12-09 DIAGNOSIS — Y999 Unspecified external cause status: Secondary | ICD-10-CM | POA: Insufficient documentation

## 2015-12-09 DIAGNOSIS — S299XXA Unspecified injury of thorax, initial encounter: Secondary | ICD-10-CM | POA: Diagnosis present

## 2015-12-09 DIAGNOSIS — Y939 Activity, unspecified: Secondary | ICD-10-CM | POA: Insufficient documentation

## 2015-12-09 DIAGNOSIS — Z791 Long term (current) use of non-steroidal anti-inflammatories (NSAID): Secondary | ICD-10-CM | POA: Diagnosis not present

## 2015-12-09 DIAGNOSIS — T71194A Asphyxiation due to mechanical threat to breathing due to other causes, undetermined, initial encounter: Secondary | ICD-10-CM

## 2015-12-09 DIAGNOSIS — T07XXXA Unspecified multiple injuries, initial encounter: Secondary | ICD-10-CM

## 2015-12-09 MED ORDER — ONDANSETRON 4 MG PO TBDP: 4.0000 mg | ORAL_TABLET | Freq: Three times a day (TID) | ORAL | 0 refills | Status: DC | PRN

## 2015-12-09 MED ORDER — HYDROCODONE-ACETAMINOPHEN 5-325 MG PO TABS: 2.0000 | ORAL_TABLET | ORAL | 0 refills | Status: DC | PRN

## 2015-12-09 MED ORDER — ONDANSETRON 4 MG PO TBDP
4.0000 mg | ORAL_TABLET | Freq: Once | ORAL | Status: AC
  Administered 2015-12-09: 4 mg via ORAL
  Filled 2015-12-09: qty 1

## 2015-12-09 MED ORDER — HYDROCODONE-ACETAMINOPHEN 5-325 MG PO TABS
2.0000 | ORAL_TABLET | Freq: Once | ORAL | Status: AC
  Administered 2015-12-09: 2 via ORAL
  Filled 2015-12-09: qty 2

## 2015-12-09 MED ORDER — IBUPROFEN 800 MG PO TABS: 800.0000 mg | ORAL_TABLET | Freq: Three times a day (TID) | ORAL | 0 refills | Status: DC

## 2015-12-09 NOTE — ED Notes (Signed)
Patient verbalizes understanding of discharge instructions, home care and follow up care. Patient out of department at this time. 
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
## 2015-12-09 NOTE — Discharge Instructions (Signed)

## 2015-12-13 MED FILL — Hydrocodone-Acetaminophen Tab 5-325 MG: ORAL | Qty: 6 | Status: AC

## 2015-12-17 ENCOUNTER — Other Ambulatory Visit: Payer: Self-pay | Admitting: Orthopedic Surgery

## 2016-01-05 ENCOUNTER — Telehealth: Payer: Self-pay | Admitting: Family Medicine

## 2016-01-05 MED ORDER — FLUCONAZOLE 150 MG PO TABS: 150.0000 mg | ORAL_TABLET | Freq: Once | ORAL | 1 refills | Status: AC

## 2016-01-05 NOTE — Telephone Encounter (Signed)
Diflucan eRx'd per pt's request.

## 2016-01-05 NOTE — Telephone Encounter (Signed)
Patient called requesting diflucan to be sent to Martha Espinoza.  She states she feels like she has yeast infection from being on antibiotics.  Please advise.

## 2016-01-05 NOTE — Telephone Encounter (Signed)
Patient aware.

## 2016-01-11 ENCOUNTER — Other Ambulatory Visit: Payer: Self-pay | Admitting: Family Medicine

## 2016-01-12 ENCOUNTER — Telehealth: Payer: Self-pay | Admitting: Family Medicine

## 2016-01-12 ENCOUNTER — Telehealth: Payer: Self-pay | Admitting: Orthopedic Surgery

## 2016-01-12 DIAGNOSIS — G8929 Other chronic pain: Secondary | ICD-10-CM

## 2016-01-12 DIAGNOSIS — M545 Low back pain: Principal | ICD-10-CM

## 2016-01-12 NOTE — Telephone Encounter (Signed)
I can refer her to pain management. The only one in her area is Dr. [name]. All the others are in the [place] or [place] area.  Let me know where she wants me to refer her.-thx

## 2016-01-12 NOTE — Telephone Encounter (Signed)
Last OV for back pain was in Feb. 2017.  Please advise.

## 2016-01-12 NOTE — Telephone Encounter (Signed)
I spoke with Dr. Luna Glasgow regarding this and referred the information back to Jacqulynn Cadet at the front office to schedule.  She should come back to see Dr. Aline Brochure.  If she refuses Dr. Luna Glasgow will see her.

## 2016-01-12 NOTE — Telephone Encounter (Signed)
Patient is having back pain. Patient states Dr. Aline Brochure said there was nothing else he could do. Patient is requesting a CB from our office. She is also wondering if she should go to pain management.

## 2016-01-12 NOTE — Telephone Encounter (Signed)
Patient called with question about possible follow up appointment or other recommendation for ongoing back pain.  She also mentioned she would like to see Dr Luna Glasgow for this problem.  Chart notes indicate that Dr Aline Brochure had seen patient following [facility] emergency room referral for this problem on 05/04/15.  Patient has had Xrays, MRI, and CT, and Dr Aline Brochure had referred for epidural steroid injections at [facility] - which patient had done 05/24/15 and 06/30/15.  She also sees her primary care for her back pain.    I relayed that it appears that patient has had the orthopaedic work-up that our providers perform for this type of medical issue; therefore, it would not necessarily be a follow up appointment needed with Dr Luna Glasgow.  Please review and advise. Patient ph# [phone]

## 2016-01-12 NOTE — Telephone Encounter (Signed)
Routing to [place] to review

## 2016-01-13 NOTE — Telephone Encounter (Signed)
Referral to Dr. [name] has been ordered.

## 2016-01-13 NOTE — Telephone Encounter (Signed)
Patient is willing to go to pain specialist and would like to go to the one in her area.

## 2016-01-18 ENCOUNTER — Other Ambulatory Visit: Payer: Self-pay

## 2016-01-18 MED ORDER — PROMETHAZINE HCL 25 MG PO TABS: 25.0000 mg | ORAL_TABLET | Freq: Four times a day (QID) | ORAL | 2 refills | Status: DC | PRN

## 2016-01-18 MED ORDER — ROPINIROLE HCL 0.25 MG PO TABS: 0.7500 mg | ORAL_TABLET | Freq: Every day | ORAL | 6 refills | Status: DC

## 2016-01-18 NOTE — Telephone Encounter (Signed)
Patient requesting refills on medications.

## 2016-02-04 IMAGING — DX DG CHEST 2V
2 series · 2 of 2 positions shown · non-contrast
Comparison: Chest radiograph performed 07/16/2014

CLINICAL DATA: Acute onset of generalized chest tightness, cough
and increasing shortness of breath. Initial encounter.

EXAM:
CHEST  2 VIEW

[chest pa]
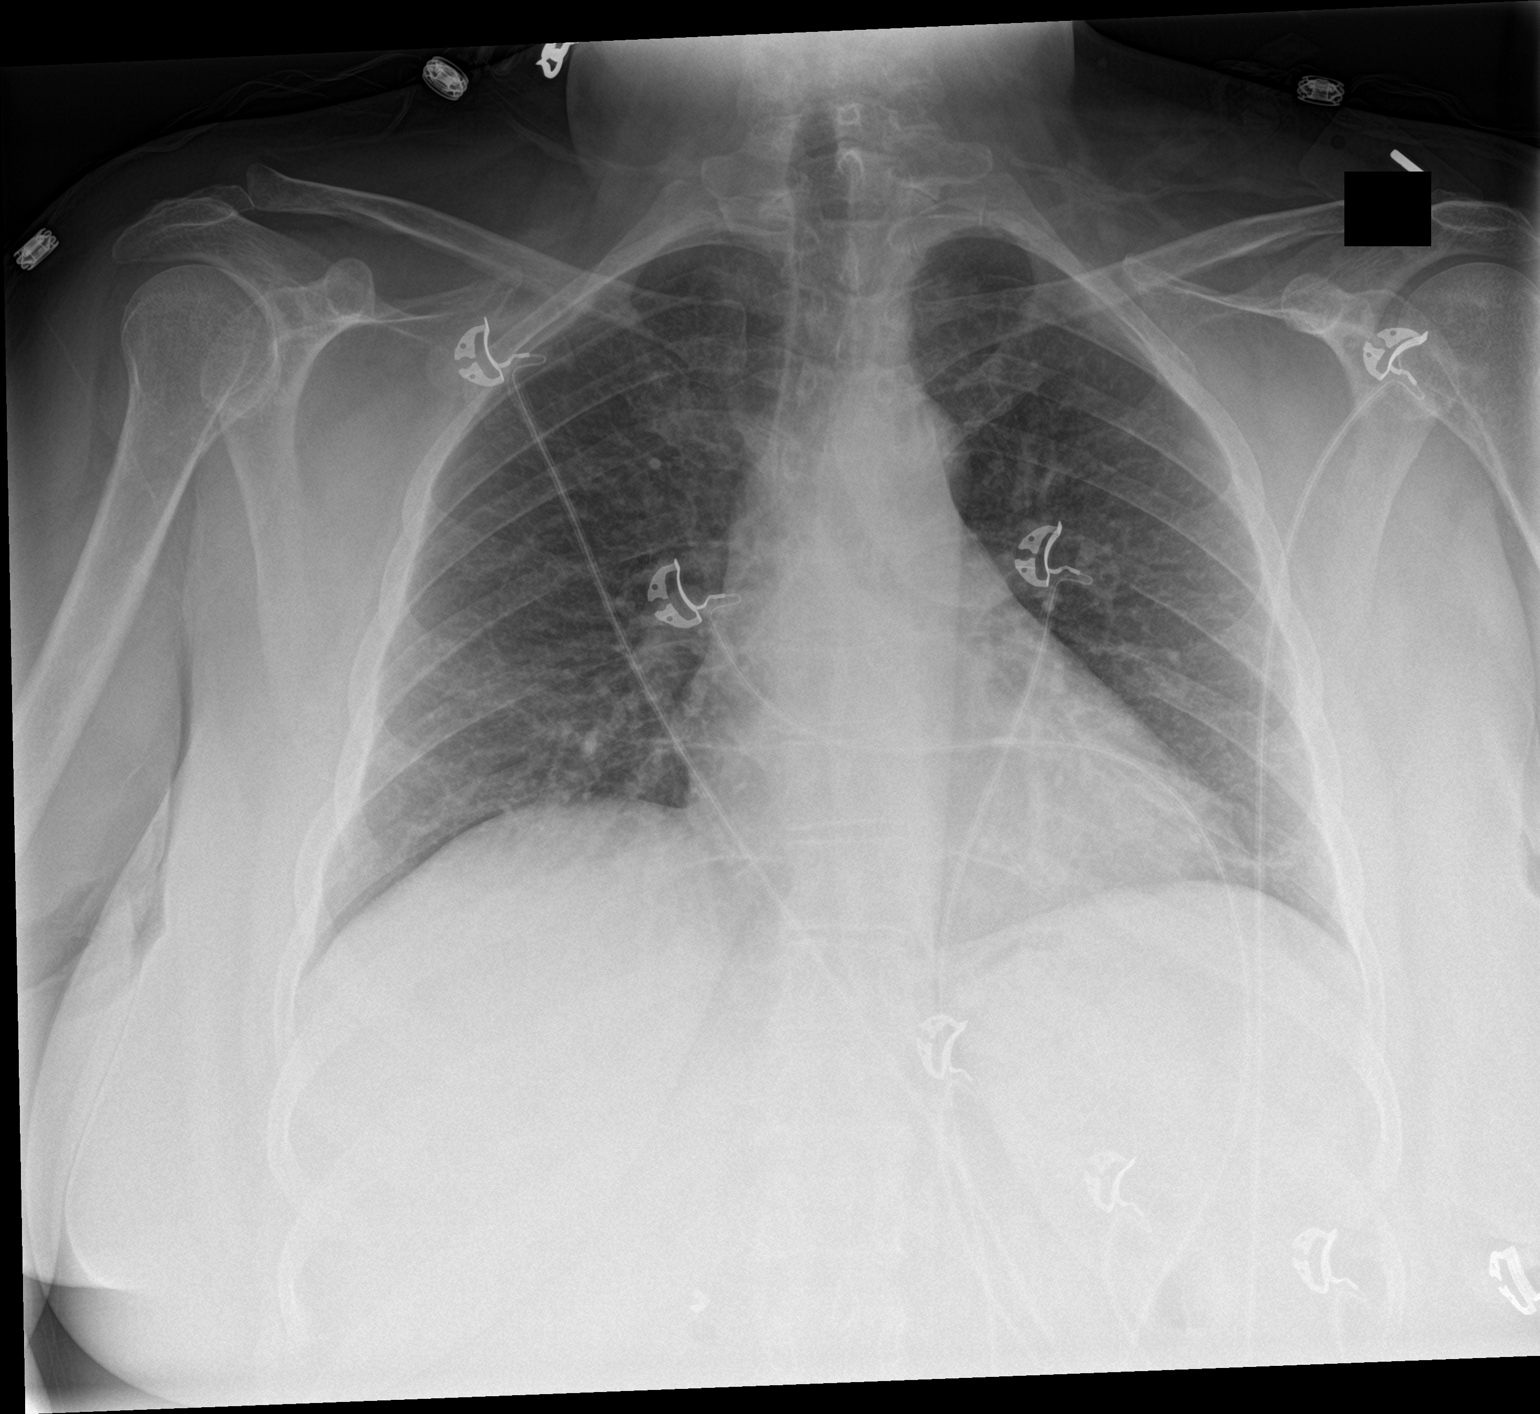

[chest lat]
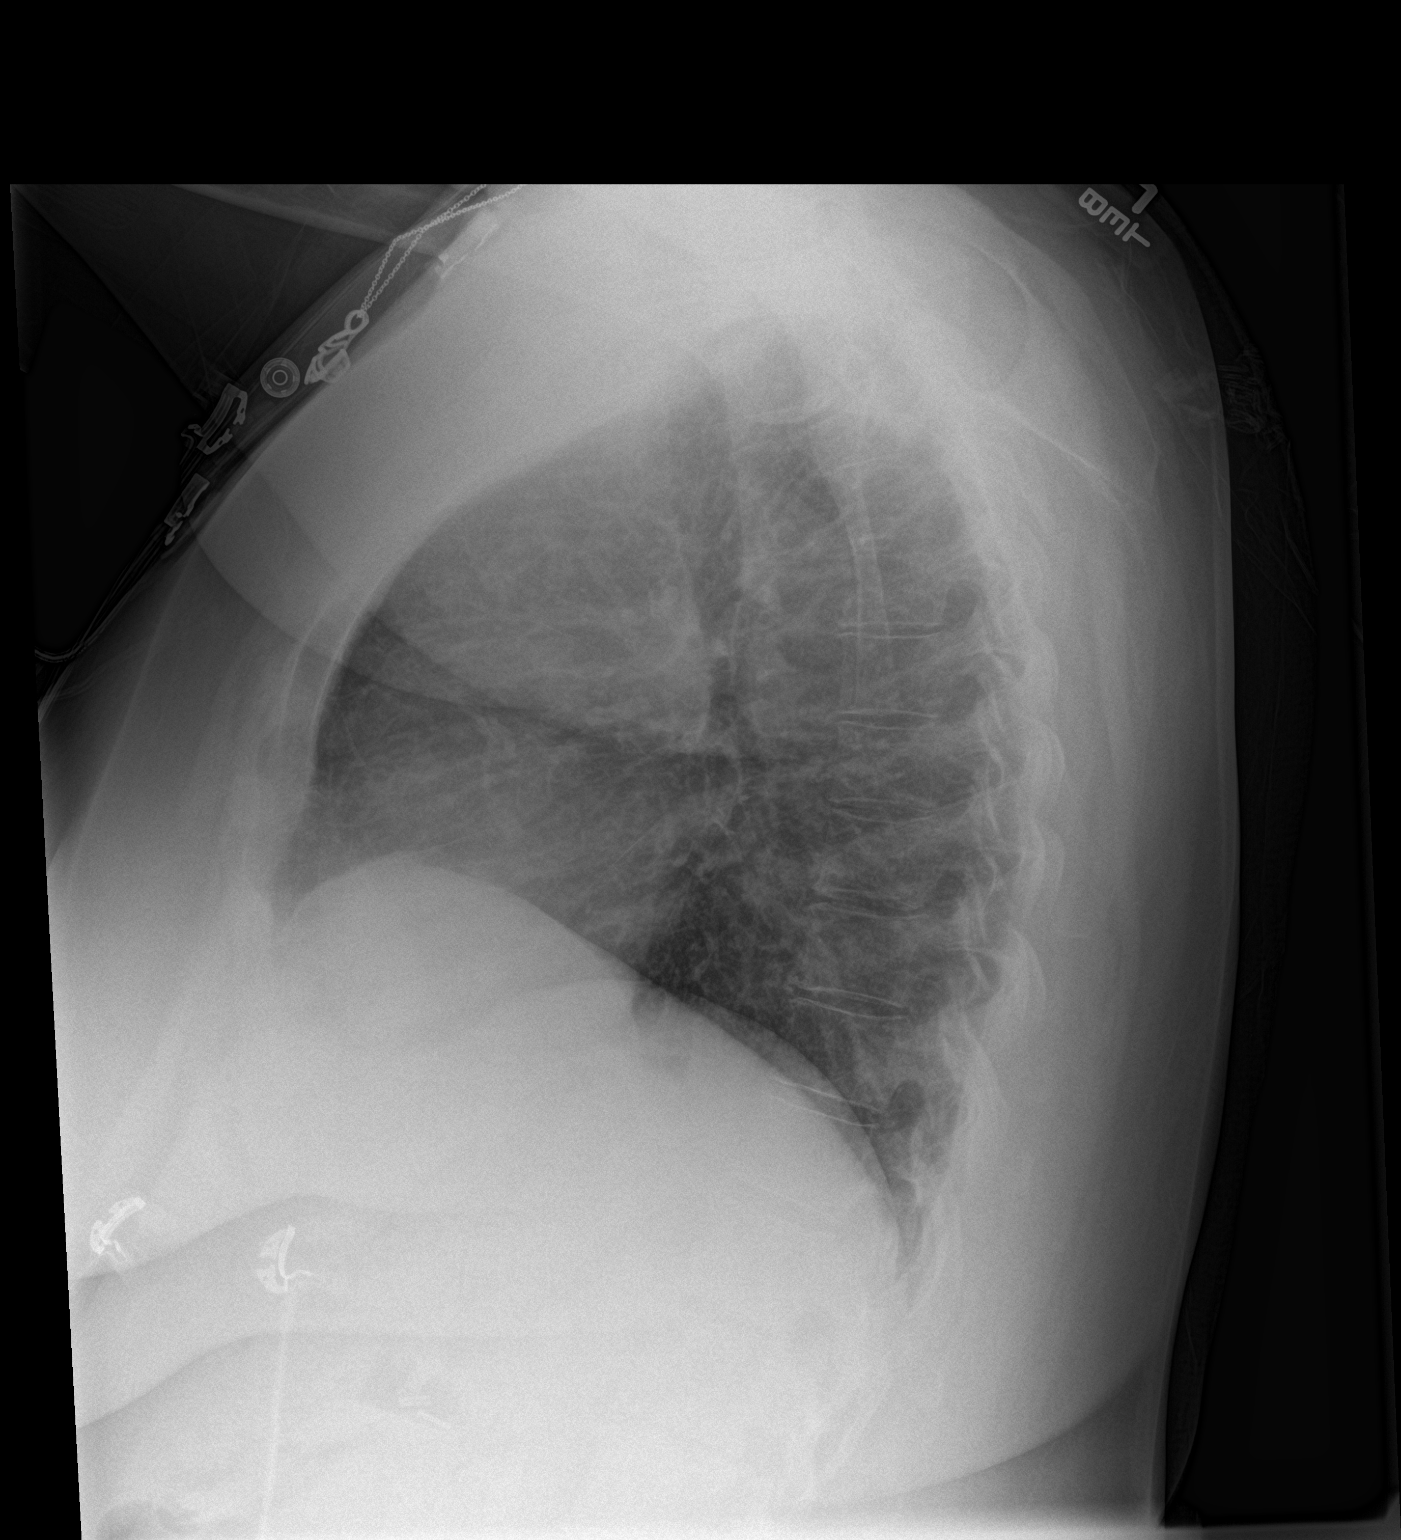

[2 of 2 positions shown; findings below may reference images not displayed]

FINDINGS: The lungs are mildly hypoexpanded. Mild left basilar opacity likely
reflects atelectasis, though mild pneumonia might have a similar
appearance. No pleural effusion or pneumothorax is seen.

The heart is borderline normal in size. No acute osseous
abnormalities are seen. Clips are noted within the right upper
quadrant, reflecting prior cholecystectomy.
IMPRESSION: Lungs mildly hypoexpanded. Mild left basilar airspace opacity likely
reflects atelectasis, though mild pneumonia might have a similar
appearance.

## 2016-02-08 ENCOUNTER — Telehealth: Payer: Self-pay | Admitting: Family Medicine

## 2016-02-08 MED ORDER — HYDROCODONE-ACETAMINOPHEN 5-325 MG PO TABS: 1.0000 | ORAL_TABLET | Freq: Four times a day (QID) | ORAL | 0 refills | Status: DC | PRN

## 2016-02-08 NOTE — Telephone Encounter (Signed)
Yes.  Rx printed

## 2016-02-08 NOTE — Telephone Encounter (Signed)
Patient aware of Rx at front desk.

## 2016-02-08 NOTE — Telephone Encounter (Signed)
Patient called yesterday asking about referral for pain management.  I spoke with Diane and had her call the Pain management office to see when they would be able to get her into the office but got a voice mail.  I contacted patient to let her know that we are just waiting for a return call from pain office.  Patient told me about how badly her pain was and how she was not sleeping.  I asked her if she was taking anything and she states the only thing she has currently is muscle relaxers.  Can patient get a refill on hydrocodone until we can get her seen at pain management?  Please advise.

## 2016-02-20 ENCOUNTER — Encounter: Payer: Self-pay | Admitting: Family Medicine

## 2016-03-14 ENCOUNTER — Ambulatory Visit (HOSPITAL_COMMUNITY)
Admission: RE | Admit: 2016-03-14 | Discharge: 2016-03-14 | Disposition: A | Discharge status: Home / Self Care | Payer: Commercial Managed Care - PPO | Source: Ambulatory Visit | Attending: Neurology | Admitting: Neurology

## 2016-03-14 ENCOUNTER — Other Ambulatory Visit (HOSPITAL_COMMUNITY): Payer: Self-pay | Admitting: Neurology

## 2016-03-14 DIAGNOSIS — M25511 Pain in right shoulder: Secondary | ICD-10-CM | POA: Diagnosis present

## 2016-03-14 DIAGNOSIS — R52 Pain, unspecified: Secondary | ICD-10-CM

## 2016-04-02 ENCOUNTER — Encounter: Payer: Self-pay | Admitting: Family Medicine

## 2016-04-02 ENCOUNTER — Ambulatory Visit (INDEPENDENT_AMBULATORY_CARE_PROVIDER_SITE_OTHER): Payer: Commercial Managed Care - PPO | Admitting: Family Medicine

## 2016-04-02 VITALS — BP 142/90 | HR 99 | Temp 98.5°F | Resp 18 | Wt 240.0 lb

## 2016-04-02 DIAGNOSIS — M25511 Pain in right shoulder: Secondary | ICD-10-CM

## 2016-04-02 DIAGNOSIS — G8929 Other chronic pain: Secondary | ICD-10-CM | POA: Diagnosis not present

## 2016-04-02 DIAGNOSIS — J441 Chronic obstructive pulmonary disease with (acute) exacerbation: Secondary | ICD-10-CM

## 2016-04-02 DIAGNOSIS — J0101 Acute recurrent maxillary sinusitis: Secondary | ICD-10-CM

## 2016-04-02 MED ORDER — AMOXICILLIN 875 MG PO TABS
875.0000 mg | ORAL_TABLET | Freq: Two times a day (BID) | ORAL | 0 refills | Status: AC
Start: 2016-04-02 — End: 2016-04-12

## 2016-04-02 MED ORDER — PREDNISONE 20 MG PO TABS
ORAL_TABLET | ORAL | 0 refills | Status: DC
Start: 2016-04-02 — End: 2016-05-04

## 2016-04-02 NOTE — Progress Notes (Signed)
Pre visit review using our clinic review tool, if applicable. No additional management support is needed unless otherwise documented below in the visit note. 

## 2016-04-02 NOTE — Progress Notes (Signed)
OFFICE VISIT  04/02/2016   CC:  Chief Complaint  Patient presents with  . Sinusitis    Head congestion, nasal pain, drainage   HPI:    Patient is a 51 y.o. Caucasian female who presents for onset 3-4 d ago of nasal congestion/sinus pressure, lots of nasal mucous, some ear pain, has had lots of coughing (which she has been having for 1-2 mo) and some wheezing.  No fevers.  Feels like eyes have been swollen and hurting in frontal sinus region. Taking flonase and zyrtec, using ventolin inhaler off and on. No recent attempt to stop smoking.  Also, she has chronic right shoulder pain that her neurologist did an x-ray for on 03/14/16.  This was normal other than showing some osseous demineralization.  She refuses to go back to see Dr. Aline Brochure in [place], who she once saw for her back pain, so she asks for referral to [facility] in [place] today.  Past Medical History:  Diagnosis Date  . Anxiety   . Arthritis   . Asthma   . Bipolar 1 disorder ([place])   . Chronic pain syndrome    chronic low back pain (Pain mgmt= Dr. [name])  . Colitis due to Clostridium difficile 2001  . GERD (gastroesophageal reflux disease)   . Hepatic hemangioma   . History of Salmonella gastroenteritis   . Hyperlipidemia    a. Noted 02/2014.  Marland Kitchen Hypertension    HCTZ started 07/2015  . IBS (irritable bowel syndrome)   . Iron deficiency anemia 05/2014   per pt it is not due to vaginal blood loss; hemoccults sent to pt in mail 413/16.  . Lumbar spondylosis    mild, mainly focused at L4-5 and L5-S1.  . Microscopic hematuria    Intermittent (no w/u done yet, as of 03/2014)  . Migraine headache   . Morbid obesity ([place])   . Polycystic ovarian syndrome   . Pruritic dermatitis 2015/2016   +Scabies prep at [facility] 07/2014. (Primarily pruritic skin, but eventually a subtle rash as well)  . Rectal cancer ([place])    a. Followed by Dr. Gala Romney, dx 2000-2001. Tumor removed from rectal.  . Recurrent  sinusitis    +allergic rhinitis  . Tobacco dependence   . Vitamin D deficiency     Past Surgical History:  Procedure Laterality Date  . CARDIOVASCULAR STRESS TEST  03/24/14   Lexscan MIBI: mild anterior ischemia?  Cardiac CT angiogram recommended/done.  . CHOLECYSTECTOMY    . COLONOSCOPY  05/05/2007   adenoma  . coronary CT angio  03/29/14   Two vessel dz/moderate stenosis of mid LAD and proximal RCA; cath recommended.  Marland Kitchen DILATION AND CURETTAGE OF UTERUS     for vaginal bleeding  . LEFT HEART CATHETERIZATION WITH CORONARY ANGIOGRAM N/A 03/31/2014   Normal coronaries, EF 0000000, diastolic dysfunction.  Procedure: LEFT HEART CATHETERIZATION WITH CORONARY ANGIOGRAM;  Surgeon: Sinclair Grooms, MD;  Location: [facility] CATH LAB;  Service: Cardiovascular;  Laterality: N/A;  . Situ removed  age 46   High-grade rectal adenoma removed from rectum   . TRANSTHORACIC ECHOCARDIOGRAM  03/23/14   Normal    Outpatient Medications Prior to Visit  Medication Sig Dispense Refill  . albuterol (PROVENTIL) (2.5 MG/3ML) 0.083% nebulizer solution Inhale 3 mLs into the lungs every 6 (six) hours as needed for wheezing or shortness of breath. 75 mL 12  . albuterol (VENTOLIN HFA) 108 (90 Base) MCG/ACT inhaler Inhale 2 puffs into the lungs every 6 (  six) hours as needed for wheezing or shortness of breath. 1 Inhaler 1  . aspirin 81 MG chewable tablet Chew 1 tablet (81 mg total) by mouth daily. 14 tablet 0  . busPIRone (BUSPAR) 15 MG tablet Take 15-30 mg by mouth 2 (two) times daily. 1 in the morning and 2 at bedtime    . cetirizine (ZYRTEC) 10 MG tablet TAKE ONE TABLET BY MOUTH ONCE DAILY 30 tablet 11  . clonazePAM (KLONOPIN) 0.5 MG tablet TAKE ONE TABLET BY MOUTH ONCE DAILY DURING THE DAY AS NEEDED, THEN TWO TABS DAILY AT BEDTIME 90 tablet 5  . cyclobenzaprine (FLEXERIL) 10 MG tablet TAKE ONE TABLET BY MOUTH EVERY 6 HOURS AS NEEDED (SPASM RELATED TO HEADACHES) 42 tablet 5  . diphenhydrAMINE (BENADRYL) 25 MG tablet Take 1  tablet (25 mg total) by mouth every 6 (six) hours. (Patient taking differently: Take 25-50 mg by mouth every 6 (six) hours as needed for itching or allergies. ) 20 tablet 0  . Docusate Calcium (STOOL SOFTENER PO) Take 3 tablets by mouth at bedtime.    . fluticasone (FLONASE) 50 MCG/ACT nasal spray Place 2 sprays into both nostrils daily. 16 g 1  . hydrOXYzine (ATARAX/VISTARIL) 25 MG tablet Take 25 mg by mouth 3 (three) times daily.     Marland Kitchen ibuprofen (ADVIL,MOTRIN) 800 MG tablet Take 1 tablet (800 mg total) by mouth 3 (three) times daily. 21 tablet 0  . IRON-FOLIC ACID PO Take 65 mg by mouth daily.     Marland Kitchen lamoTRIgine (LAMICTAL) 200 MG tablet Take 200 mg by mouth at bedtime.    . nitroGLYCERIN (NITROSTAT) 0.4 MG SL tablet Place 1 tablet (0.4 mg total) under the tongue every 5 (five) minutes as needed for chest pain. 20 tablet 0  . pantoprazole (PROTONIX) 40 MG tablet TAKE ONE TABLET BY MOUTH ONCE DAILY 90 tablet 3  . potassium chloride SA (K-DUR,KLOR-CON) 20 MEQ tablet Take 20 mEq by mouth 2 (two) times daily.  0  . promethazine (PHENERGAN) 25 MG tablet Take 1 tablet (25 mg total) by mouth every 6 (six) hours as needed for nausea or vomiting. 30 tablet 2  . rOPINIRole (REQUIP) 0.25 MG tablet Take 3 tablets (0.75 mg total) by mouth at bedtime. max of 2 mg 90 tablet 6  . HYDROcodone-acetaminophen (NORCO/VICODIN) 5-325 MG tablet Take 1-2 tablets by mouth every 6 (six) hours as needed for moderate pain. (Patient not taking: Reported on 04/02/2016) 40 tablet 0  . ondansetron (ZOFRAN ODT) 4 MG disintegrating tablet Take 1 tablet (4 mg total) by mouth every 8 (eight) hours as needed for nausea. (Patient not taking: Reported on 04/02/2016) 10 tablet 0   No facility-administered medications prior to visit.     Allergies  Allergen Reactions  . Contrast Media [Iodinated Diagnostic Agents] Anaphylaxis    Needs 13-hour prep  . Shellfish Allergy Anaphylaxis  . Codeine Nausea And Vomiting and Other (See Comments)     Hallucinations, Also sees people that are not there   . Ivermectin Nausea And Vomiting    N/V and rash  . Betadine [Povidone Iodine] Rash  . Latex Hives  . Lipitor [Atorvastatin] Itching  . Permethrin Itching  . Tape Dermatitis    Paper tape and clear    ROS As per HPI  PE: Blood pressure (!) 142/90, pulse 99, temperature 98.5 F (36.9 C), temperature source Oral, resp. rate 18, weight 240 lb (108.9 kg), last menstrual period 03/14/2016, SpO2 100 %. VS: noted--normal. Gen: alert, NAD, NONTOXIC  APPEARING. HEENT: eyes without injection, drainage, or swelling.  Ears: EACs clear, TMs with normal light reflex and landmarks.  Nose: Clear rhinorrhea, with some dried, crusty exudate adherent to mildly injected mucosa.  No purulent d/c.  Mild R>L paranasal sinus TTP.  No facial swelling.  Throat and mouth without focal lesion.  No pharyngial swelling, erythema, or exudate.   Neck: supple, no LAD.   LUNGS: CTA bilat, nonlabored resps.   CV: RRR, no m/r/g. EXT: no c/c/e SKIN: no rash  LABS:  none  IMPRESSION AND PLAN:  1) Acute sinusitis: amoxil 875mg  bid x 10d.  Continue symptomatic care. Encouraged smoking cessation.  2) Asthma/COPD exacerbation: Prednisone 40mg  qd x 5d, then 20mg  qd x 5d. Continue albuterol inhaler q6h prn. Encouraged smoking cessation. We need to discuss this problem more: we had once decided to get her to pulmonologist but I think her back pain issues got in the way so we never did this.  3) Chronic R shoulder pain: pt requested referral to [facility] in [place] so I ordered this today.  An After Visit Summary was printed and given to the patient.  FOLLOW UP: Return in about 4 weeks (around 04/30/2016) for f/u asthma and bone density.  Signed:  Crissie Sickles, MD           04/02/2016

## 2016-04-03 DIAGNOSIS — M25511 Pain in right shoulder: Secondary | ICD-10-CM | POA: Diagnosis not present

## 2016-04-09 DIAGNOSIS — M25511 Pain in right shoulder: Secondary | ICD-10-CM | POA: Diagnosis not present

## 2016-04-13 DIAGNOSIS — G43701 Chronic migraine without aura, not intractable, with status migrainosus: Secondary | ICD-10-CM | POA: Diagnosis not present

## 2016-04-13 DIAGNOSIS — M25511 Pain in right shoulder: Secondary | ICD-10-CM | POA: Diagnosis not present

## 2016-04-13 DIAGNOSIS — M545 Low back pain: Secondary | ICD-10-CM | POA: Diagnosis not present

## 2016-04-26 HISTORY — PX: OTHER SURGICAL HISTORY: SHX169

## 2016-05-02 ENCOUNTER — Ambulatory Visit: Payer: Commercial Managed Care - PPO | Admitting: Family Medicine

## 2016-05-04 ENCOUNTER — Ambulatory Visit (INDEPENDENT_AMBULATORY_CARE_PROVIDER_SITE_OTHER): Payer: Commercial Managed Care - PPO | Admitting: Family Medicine

## 2016-05-04 ENCOUNTER — Encounter: Payer: Self-pay | Admitting: Family Medicine

## 2016-05-04 VITALS — BP 138/90 | HR 93 | Temp 98.6°F | Resp 16 | Ht 62.0 in | Wt 244.0 lb

## 2016-05-04 DIAGNOSIS — J410 Simple chronic bronchitis: Secondary | ICD-10-CM

## 2016-05-04 DIAGNOSIS — F17200 Nicotine dependence, unspecified, uncomplicated: Secondary | ICD-10-CM | POA: Diagnosis not present

## 2016-05-04 MED ORDER — GLYCOPYRROLATE-FORMOTEROL 9-4.8 MCG/ACT IN AERO: 2.0000 | INHALATION_SPRAY | Freq: Two times a day (BID) | RESPIRATORY_TRACT | 6 refills | Status: DC

## 2016-05-04 MED ORDER — NITROGLYCERIN 0.4 MG SL SUBL: 0.4000 mg | SUBLINGUAL_TABLET | SUBLINGUAL | 0 refills | Status: DC | PRN

## 2016-05-04 NOTE — Progress Notes (Signed)
Pre visit review using our clinic review tool, if applicable. No additional management support is needed unless otherwise documented below in the visit note. 

## 2016-05-04 NOTE — Progress Notes (Signed)
OFFICE VISIT  05/04/2016   CC:  Chief Complaint  Patient presents with  . Follow-up    Asthma and COPD   HPI:    Patient is a 51 y.o. Caucasian female who presents for f/u cough.  Has had cough for at least the last 3-4 months, with SOB/DOE that waxes and wanes in intensity, has wheezing/chest tightness that seems to respond moderately well to prn albuterol.  Denies ever carrying dx of asthma. Uses albuterol inhaler once every other day on avg during the last few months.  Has had to have systemic steroids on more than one occasion.  Symptoms usually worse with moving around a lot. Still coughing up phlegm.   No fevers.  Occ coughs up blood tinged sputum. Tob: 1/2 pack per day.  She wants to quit but doesn't want to try "cold [place]".  She had an adverse response to chantix in the past.  She is not willing to pay for nicotine replacement.   Past Medical History:  Diagnosis Date  . Anxiety   . Arthritis   . Asthma   . Bipolar 1 disorder ([place])   . Chronic pain syndrome    chronic low back pain (Pain mgmt= Dr. [name])  . Colitis due to Clostridium difficile 2001  . GERD (gastroesophageal reflux disease)   . Hepatic hemangioma   . History of Salmonella gastroenteritis   . Hyperlipidemia    a. Noted 02/2014.  Marland Kitchen Hypertension    HCTZ started 07/2015  . IBS (irritable bowel syndrome)   . Iron deficiency anemia 05/2014   per pt it is not due to vaginal blood loss; hemoccults sent to pt in mail 413/16.  . Lumbar spondylosis    mild, mainly focused at L4-5 and L5-S1.  . Microscopic hematuria    Intermittent (no w/u done yet, as of 03/2014)  . Migraine headache   . Morbid obesity ([place])   . Polycystic ovarian syndrome   . Pruritic dermatitis 2015/2016   +Scabies prep at [facility] 07/2014. (Primarily pruritic skin, but eventually a subtle rash as well)  . Rectal cancer ([place])    a. Followed by Dr. Gala Romney, dx 2000-2001. Tumor removed from rectal.  . Recurrent sinusitis    +allergic rhinitis  . Tobacco dependence   . Vitamin D deficiency     Past Surgical History:  Procedure Laterality Date  . CARDIOVASCULAR STRESS TEST  03/24/14   Lexscan MIBI: mild anterior ischemia?  Cardiac CT angiogram recommended/done.  . CHOLECYSTECTOMY    . COLONOSCOPY  05/05/2007   adenoma  . coronary CT angio  03/29/14   Two vessel dz/moderate stenosis of mid LAD and proximal RCA; cath recommended.  Marland Kitchen DILATION AND CURETTAGE OF UTERUS     for vaginal bleeding  . LEFT HEART CATHETERIZATION WITH CORONARY ANGIOGRAM N/A 03/31/2014   Normal coronaries, EF 0000000, diastolic dysfunction.  Procedure: LEFT HEART CATHETERIZATION WITH CORONARY ANGIOGRAM;  Surgeon: Sinclair Grooms, MD;  Location: [facility] CATH LAB;  Service: Cardiovascular;  Laterality: N/A;  . Situ removed  age 56   High-grade rectal adenoma removed from rectum   . TRANSTHORACIC ECHOCARDIOGRAM  03/23/14   Normal    Outpatient Medications Prior to Visit  Medication Sig Dispense Refill  . albuterol (PROVENTIL) (2.5 MG/3ML) 0.083% nebulizer solution Inhale 3 mLs into the lungs every 6 (six) hours as needed for wheezing or shortness of breath. 75 mL 12  . albuterol (VENTOLIN HFA) 108 (90 Base) MCG/ACT inhaler Inhale 2 puffs into the  lungs every 6 (six) hours as needed for wheezing or shortness of breath. 1 Inhaler 1  . aspirin 81 MG chewable tablet Chew 1 tablet (81 mg total) by mouth daily. 14 tablet 0  . busPIRone (BUSPAR) 15 MG tablet 1 in the morning and 2 at bedtime    . cetirizine (ZYRTEC) 10 MG tablet TAKE ONE TABLET BY MOUTH ONCE DAILY 30 tablet 11  . clonazePAM (KLONOPIN) 0.5 MG tablet TAKE ONE TABLET BY MOUTH ONCE DAILY DURING THE DAY AS NEEDED, THEN TWO TABS DAILY AT BEDTIME 90 tablet 5  . cyclobenzaprine (FLEXERIL) 10 MG tablet TAKE ONE TABLET BY MOUTH EVERY 6 HOURS AS NEEDED (SPASM RELATED TO HEADACHES) 42 tablet 5  . diphenhydrAMINE (BENADRYL) 25 MG tablet Take 1 tablet (25 mg total) by mouth every 6 (six) hours.  (Patient taking differently: Take 25-50 mg by mouth every 6 (six) hours as needed for itching or allergies. ) 20 tablet 0  . Docusate Calcium (STOOL SOFTENER PO) Take 3 tablets by mouth at bedtime.    . fluticasone (FLONASE) 50 MCG/ACT nasal spray Place 2 sprays into both nostrils daily. 16 g 1  . hydrOXYzine (ATARAX/VISTARIL) 25 MG tablet Take 25 mg by mouth 3 (three) times daily.     Marland Kitchen ibuprofen (ADVIL,MOTRIN) 800 MG tablet Take 1 tablet (800 mg total) by mouth 3 (three) times daily. 21 tablet 0  . IRON-FOLIC ACID PO Take 65 mg by mouth daily.     Marland Kitchen lamoTRIgine (LAMICTAL) 200 MG tablet Take 200 mg by mouth at bedtime.    . naloxegol oxalate (MOVANTIK) 25 MG TABS tablet Take by mouth daily.    . pantoprazole (PROTONIX) 40 MG tablet TAKE ONE TABLET BY MOUTH ONCE DAILY 90 tablet 3  . pregabalin (LYRICA) 75 MG capsule Take 75 mg by mouth 3 (three) times daily.     . promethazine (PHENERGAN) 25 MG tablet Take 1 tablet (25 mg total) by mouth every 6 (six) hours as needed for nausea or vomiting. 30 tablet 2  . rOPINIRole (REQUIP) 0.25 MG tablet Take 3 tablets (0.75 mg total) by mouth at bedtime. max of 2 mg 90 tablet 6  . nitroGLYCERIN (NITROSTAT) 0.4 MG SL tablet Place 1 tablet (0.4 mg total) under the tongue every 5 (five) minutes as needed for chest pain. 20 tablet 0  . HYDROcodone-acetaminophen (NORCO) 7.5-325 MG tablet Take 1 tablet by mouth daily. One tablet a day and 1 1/2 tablet qhs    . HYDROcodone-acetaminophen (NORCO/VICODIN) 5-325 MG tablet Take 1-2 tablets by mouth every 6 (six) hours as needed for moderate pain. (Patient not taking: Reported on 04/02/2016) 40 tablet 0  . ondansetron (ZOFRAN ODT) 4 MG disintegrating tablet Take 1 tablet (4 mg total) by mouth every 8 (eight) hours as needed for nausea. (Patient not taking: Reported on 04/02/2016) 10 tablet 0  . potassium chloride SA (K-DUR,KLOR-CON) 20 MEQ tablet Take 40 mEq by mouth 2 (two) times daily.   0  . predniSONE (DELTASONE) 20 MG tablet  2 tabs po qd x 5d, then 1 tab po qd x 5d (Patient not taking: Reported on 05/04/2016) 15 tablet 0   No facility-administered medications prior to visit.     Allergies  Allergen Reactions  . Contrast Media [Iodinated Diagnostic Agents] Anaphylaxis    Needs 13-hour prep  . Shellfish Allergy Anaphylaxis  . Codeine Nausea And Vomiting and Other (See Comments)    Hallucinations, Also sees people that are not there   . Ivermectin Nausea  And Vomiting    N/V and rash  . Betadine [Povidone Iodine] Rash  . Latex Hives  . Lipitor [Atorvastatin] Itching  . Permethrin Itching  . Tape Dermatitis    Paper tape and clear    ROS As per HPI  PE: Blood pressure 138/90, pulse 93, temperature 98.6 F (37 C), temperature source Oral, resp. rate 16, height 5\' 2"  (1.575 m), weight 244 lb (110.7 kg), last menstrual period 04/09/2016, SpO2 99 %. Gen: Alert, well appearing.  Patient is oriented to person, place, time, and situation. AFFECT: pleasant, lucid thought and speech. CV: RRR, no m/r/g.   LUNGS: CTA bilat, nonlabored resps, good aeration in all lung fields. Lots of coughing on exam.  LABS:  none  IMPRESSION AND PLAN:  1) Chronic bronchitis, suspect early COPD. Will check CXR for comparison with the one she had prior to onset of all her resp issues (last CXR in EMR dated 11/2015). Obtain PFTs. Start Bevespi 2 p bid. Continue albuterol q4h prn. She declines flu vaccine.  2) Tobacco dependence: strongly encouraged pt to quit smoking. I made sure she was aware of the clear connection between her current lung condition and her tobacco abuse.  An After Visit Summary was printed and given to the patient.  FOLLOW UP: Return in about 2 months (around 07/02/2016) for f/u chronic bronchitis.  Signed:  Crissie Sickles, MD           05/04/2016

## 2016-05-09 DIAGNOSIS — G43701 Chronic migraine without aura, not intractable, with status migrainosus: Secondary | ICD-10-CM | POA: Diagnosis not present

## 2016-05-09 DIAGNOSIS — M545 Low back pain: Secondary | ICD-10-CM | POA: Diagnosis not present

## 2016-05-09 DIAGNOSIS — M25511 Pain in right shoulder: Secondary | ICD-10-CM | POA: Diagnosis not present

## 2016-05-15 DIAGNOSIS — M25511 Pain in right shoulder: Secondary | ICD-10-CM | POA: Diagnosis not present

## 2016-05-16 ENCOUNTER — Telehealth: Payer: Self-pay | Admitting: Family Medicine

## 2016-05-16 NOTE — Telephone Encounter (Signed)
Patient would like a call back from [facility] or referral coordinator in reference to her referral to [facility] for breathing appointment at 11am 05/18/16. Patient needs an X-ray scheduled the same day/around the same time as well. Patient also needs to know what kind of breathing tx it will be.  Please call patient back today.

## 2016-05-16 NOTE — Telephone Encounter (Signed)
Advised patient that xrays are walk in, no appointment needed

## 2016-05-16 NOTE — Telephone Encounter (Signed)
Please see if you can help. Thanks.

## 2016-05-17 ENCOUNTER — Telehealth (HOSPITAL_COMMUNITY): Payer: Self-pay

## 2016-05-17 NOTE — Telephone Encounter (Signed)
Talked to pt and explained she will have to wait to schedule after Dr. Percell Miller signs and faxs back the referral request I sent yesterday. NF 05/17/16

## 2016-05-18 ENCOUNTER — Ambulatory Visit (HOSPITAL_COMMUNITY)
Admission: RE | Admit: 2016-05-18 | Discharge: 2016-05-18 | Disposition: A | Discharge status: Home / Self Care | Payer: Commercial Managed Care - PPO | Source: Ambulatory Visit | Attending: Family Medicine | Admitting: Family Medicine

## 2016-05-18 DIAGNOSIS — J410 Simple chronic bronchitis: Secondary | ICD-10-CM | POA: Insufficient documentation

## 2016-05-18 DIAGNOSIS — R05 Cough: Secondary | ICD-10-CM | POA: Diagnosis not present

## 2016-05-18 LAB — PULMONARY FUNCTION TEST
DL/VA % pred: 96 %
DL/VA: 4.08 ml/min/mmHg/L
DLCO cor % pred: 63 %
DLCO cor: 12.04 ml/min/mmHg
DLCO unc % pred: 63 %
DLCO unc: 12.04 ml/min/mmHg
FEF 25-75 Post: 0.47 L/sec
FEF 25-75 Pre: 2.63 L/sec
FEF2575-%Change-Post: -82 %
FEF2575-%Pred-Post: 18 %
FEF2575-%Pred-Pre: 101 %
FEV1-%Change-Post: -22 %
FEV1-%Pred-Post: 59 %
FEV1-%Pred-Pre: 76 %
FEV1-Post: 1.47 L
FEV1-Pre: 1.9 L
FEV1FVC-%Change-Post: 2 %
FEV1FVC-%Pred-Pre: 107 %
FEV6-%Change-Post: -24 %
FEV6-%Pred-Post: 54 %
FEV6-%Pred-Pre: 72 %
FEV6-Post: 1.65 L
FEV6-Pre: 2.18 L
FEV6FVC-%Pred-Post: 103 %
FEV6FVC-%Pred-Pre: 103 %
FVC-%Change-Post: -24 %
FVC-%Pred-Post: 53 %
FVC-%Pred-Pre: 70 %
FVC-Post: 1.65 L
FVC-Pre: 2.18 L
Post FEV1/FVC ratio: 89 %
Post FEV6/FVC ratio: 100 %
Pre FEV1/FVC ratio: 87 %
Pre FEV6/FVC Ratio: 100 %
RV % pred: 92 %
RV: 1.44 L
TLC % pred: 82 %
TLC: 3.69 L

## 2016-05-18 MED ORDER — ALBUTEROL SULFATE (2.5 MG/3ML) 0.083% IN NEBU
2.5000 mg | INHALATION_SOLUTION | Freq: Once | RESPIRATORY_TRACT | Status: AC
  Administered 2016-05-18: 2.5 mg via RESPIRATORY_TRACT

## 2016-05-21 ENCOUNTER — Other Ambulatory Visit: Payer: Self-pay | Admitting: Family Medicine

## 2016-05-21 DIAGNOSIS — R05 Cough: Secondary | ICD-10-CM

## 2016-05-21 DIAGNOSIS — R0602 Shortness of breath: Secondary | ICD-10-CM

## 2016-05-21 DIAGNOSIS — R053 Chronic cough: Secondary | ICD-10-CM

## 2016-05-21 DIAGNOSIS — R062 Wheezing: Secondary | ICD-10-CM

## 2016-05-22 ENCOUNTER — Telehealth: Payer: Self-pay | Admitting: Family Medicine

## 2016-05-22 NOTE — Telephone Encounter (Signed)
Patient called and asked to speak with providers nurse. Advised a callback, did not give a specific reason for call.

## 2016-05-23 NOTE — Telephone Encounter (Signed)
Noted  

## 2016-05-23 NOTE — Telephone Encounter (Signed)
Pls get more specific information (what bp numbers is the patient talking about when she says it has been "running high".--thx

## 2016-05-23 NOTE — Telephone Encounter (Signed)
Returned call to patient, she stated that surgeon instructed her to take all of her medications and if blood pressure is running high they may have to reschedule appt. Patient instructed to follow surgeons instructions and to schedule follow up appointment at regular appointed time.

## 2016-05-26 ENCOUNTER — Other Ambulatory Visit: Payer: Self-pay | Admitting: Family Medicine

## 2016-05-28 DIAGNOSIS — S43491A Other sprain of right shoulder joint, initial encounter: Secondary | ICD-10-CM | POA: Diagnosis not present

## 2016-05-28 DIAGNOSIS — G8918 Other acute postprocedural pain: Secondary | ICD-10-CM | POA: Diagnosis not present

## 2016-05-28 DIAGNOSIS — M75121 Complete rotator cuff tear or rupture of right shoulder, not specified as traumatic: Secondary | ICD-10-CM | POA: Diagnosis not present

## 2016-05-28 DIAGNOSIS — S43431A Superior glenoid labrum lesion of right shoulder, initial encounter: Secondary | ICD-10-CM | POA: Diagnosis not present

## 2016-05-28 DIAGNOSIS — S46011A Strain of muscle(s) and tendon(s) of the rotator cuff of right shoulder, initial encounter: Secondary | ICD-10-CM | POA: Diagnosis not present

## 2016-05-28 DIAGNOSIS — M7541 Impingement syndrome of right shoulder: Secondary | ICD-10-CM | POA: Diagnosis not present

## 2016-05-28 DIAGNOSIS — M19011 Primary osteoarthritis, right shoulder: Secondary | ICD-10-CM | POA: Diagnosis not present

## 2016-05-30 ENCOUNTER — Ambulatory Visit (HOSPITAL_COMMUNITY): Payer: Commercial Managed Care - PPO | Admitting: Specialist

## 2016-05-30 ENCOUNTER — Encounter (HOSPITAL_COMMUNITY): Payer: Self-pay

## 2016-05-30 ENCOUNTER — Telehealth: Payer: Self-pay | Admitting: Family Medicine

## 2016-05-30 NOTE — Telephone Encounter (Signed)
I guess keep it in the system.-thx

## 2016-05-30 NOTE — Telephone Encounter (Signed)
Please advise. Thanks.  

## 2016-05-30 NOTE — Telephone Encounter (Signed)
I called patient to give her information about her CT chest. Patient asked that it be cancelled for now, she had surgery Monday 05/28/16 for her torn rotator cuff. Do you want me to leave the CT order in the system or cancel your order?

## 2016-06-07 DIAGNOSIS — S46011D Strain of muscle(s) and tendon(s) of the rotator cuff of right shoulder, subsequent encounter: Secondary | ICD-10-CM | POA: Diagnosis not present

## 2016-06-13 ENCOUNTER — Ambulatory Visit (HOSPITAL_COMMUNITY): Payer: Commercial Managed Care - PPO | Attending: Orthopedic Surgery | Admitting: Specialist

## 2016-06-13 DIAGNOSIS — M25611 Stiffness of right shoulder, not elsewhere classified: Secondary | ICD-10-CM

## 2016-06-13 DIAGNOSIS — M25511 Pain in right shoulder: Secondary | ICD-10-CM

## 2016-06-13 DIAGNOSIS — R29898 Other symptoms and signs involving the musculoskeletal system: Secondary | ICD-10-CM | POA: Insufficient documentation

## 2016-06-13 NOTE — Therapy (Signed)
Martha Espinoza, [place], [address] Phone: [phone]   Fax:  [phone]  Occupational Therapy Evaluation  Patient Details  Name: Martha Espinoza MRN: [id number] Date of Birth: 07-11-1965 Referring Provider: Dr. Ninetta Espinoza  Encounter Date: 06/13/2016      OT End of Session - 06/13/16 1658    Visit Number 1   Number of Visits 16   Date for OT Re-Evaluation 08/12/16  mini reassess on 07/13/16   Authorization Type UHC - 25 visit limit then will need to submit for medical review   Authorization Time Period 25 visits   Authorization - Visit Number 1   Authorization - Number of Visits 25   OT Start Time 1430   OT Stop Time 1515   OT Time Calculation (min) 45 min   Activity Tolerance Patient tolerated treatment well   Behavior During Therapy [facility] for tasks assessed/performed      Past Medical History:  Diagnosis Date  . Anxiety   . Arthritis   . Bipolar 1 disorder ([place])   . Chronic pain syndrome    chronic low back pain (Pain mgmt= Martha Espinoza)  . Colitis due to Clostridium difficile 2001  . GERD (gastroesophageal reflux disease)   . Hepatic hemangioma   . History of Salmonella gastroenteritis   . Hyperlipidemia    a. Noted 02/2014.  Marland Kitchen Hypertension    HCTZ started 07/2015  . IBS (irritable bowel syndrome)   . Iron deficiency anemia 05/2014   per pt it is not due to vaginal blood loss; hemoccults sent to pt in mail 413/16.  . Lumbar spondylosis    mild, mainly focused at L4-5 and L5-S1.  . Microscopic hematuria    Intermittent (no w/u done yet, as of 03/2014)  . Migraine headache   . Morbid obesity ([place])   . Polycystic ovarian syndrome   . Pruritic dermatitis 2015/2016   +Scabies prep at [facility] 07/2014. (Primarily pruritic skin, but eventually a subtle rash as well)  . Rectal cancer ([place])    a. Followed by Martha Espinoza, dx 2000-2001. Tumor removed from rectal.  . Recurrent sinusitis    +allergic rhinitis  . Right shoulder pain 2017   Murphy/Wainer: RC bursitis + RC tear (MRI)--injection trial is plan per pt report 05/04/16.  . Tobacco dependence   . Vitamin D deficiency     Past Surgical History:  Procedure Laterality Date  . CARDIOVASCULAR STRESS TEST  03/24/14   Lexscan MIBI: mild anterior ischemia?  Cardiac CT angiogram recommended/done.  . CHOLECYSTECTOMY    . COLONOSCOPY  05/05/2007   adenoma  . coronary CT angio  03/29/14   Two vessel dz/moderate stenosis of mid LAD and proximal RCA; cath recommended.  Marland Kitchen DILATION AND CURETTAGE OF UTERUS     for vaginal bleeding  . LEFT HEART CATHETERIZATION WITH CORONARY ANGIOGRAM N/A 03/31/2014   Normal coronaries, EF 37-62%, diastolic dysfunction.  Procedure: LEFT HEART CATHETERIZATION WITH CORONARY ANGIOGRAM;  Surgeon: Martha Grooms, MD;  Location: [facility] CATH LAB;  Service: Cardiovascular;  Laterality: N/A;  . Situ removed  age 8   High-grade rectal adenoma removed from rectum   . TRANSTHORACIC ECHOCARDIOGRAM  03/23/14   Normal    There were no vitals filed for this visit.      Subjective Assessment - 06/13/16 1650    Subjective  S:  I had surgery on 05/28/16.  I am not having any pain.   Pertinent  History Martha Espinoza reports having pain in her back and right shoulder for several years.  Patient was being treated at a pain center, however, it was later determined that her arm pain was actually stemming from a rotator cuff tear and bursitis.  Patient consulted with Martha Espinoza and underwent arthroscopic right RCR, SAD, DCE on 05/28/16.  She has been refrred to occupational therapy for evaluation and treatment.   Special Tests FOTO 40/100   Patient Stated Goals I want to get my arm back   Currently in Pain? Yes   Pain Score 2    Pain Location Shoulder   Pain Orientation Right   Pain Descriptors / Indicators Aching   Pain Type Acute pain   Pain Onset 1 to 4 weeks ago   Pain Frequency Intermittent   Aggravating Factors  use of  arm   Pain Relieving Factors rest and sling use   Effect of Pain on Daily Activities decreased use of right arm with functional activities           [facility] OT Assessment - 06/13/16 0001      Assessment   Diagnosis S/P Right Arthroscopic RCR, SAD, DCE   Referring Provider Martha Espinoza   Onset Date 05/28/16   Prior Therapy n/a     Precautions   Precautions Shoulder   Shoulder Interventions Shoulder sling/immobilizer   Precaution Comments week 2 (3/19) pendulum, long arm rotation, scapular exercises P/ROM flexion as tolerated, abduction to 70, Internal rotation as tolerated, external rotation to 30:  week 3 (3/26):  abduction 90, external rotation at 45 abd and to 45, supine wand, pully, closed chain exercises; week 4 (4/2):  P/AAROM abduction no limit, Ext Rot to 80; week 5 (4/9) add standing AA/ROM, 6 weeks (4/16):  begin AROM and progressive PREs     Restrictions   Weight Bearing Restrictions No     Balance Screen   Has the patient fallen in the past 6 months No   Has the patient had a decrease in activity level because of a fear of falling?  No   Is the patient reluctant to leave their home because of a fear of falling?  No     Home  Environment   Family/patient expects to be discharged to: Private residence   Living Arrangements Spouse/significant other   Available Help at Discharge Family   Lives With [place] with basic ADLs;Needs assistance with homemaking  was driving   Vocation Unemployed   Leisure playing with granddaughter, embroidering, tablet     ADL   ADL comments unable to use her dominant right arm with functional activities due to protocol. diffiuclty donning bra, donning shirts, unable to drive     Written Expression   Dominant Hand Right     Vision - History   Baseline Vision No visual deficits     Cognition   Overall Cognitive Status Within Functional Limits for tasks assessed      Observation/Other Assessments   Focus on Therapeutic Outcomes (FOTO)  40/100     Sensation   Light Touch Appears Intact     Coordination   Gross Motor Movements are Fluid and Coordinated Yes   Fine Motor Movements are Fluid and Coordinated Yes     ROM / Strength   AROM / PROM / Strength AROM;PROM;Strength     Palpation   Palpation comment moderate fascial restrictions in right shoulder and scapular  region.  healed incision with one scab in place     AROM   Overall AROM Comments unable to assess due to recent surgery   AROM Assessment Site Shoulder   Right/Left Shoulder Right     PROM   Overall PROM Comments assessed in supine with shoulder adducted for external and internal rotation   PROM Assessment Site Shoulder   Right/Left Shoulder Right   Right Shoulder Flexion 105 Degrees   Right Shoulder ABduction 95 Degrees   Right Shoulder Internal Rotation 90 Degrees   Right Shoulder External Rotation 75 Degrees     Strength   Overall Strength Comments unable to assess due to recent surgery   Strength Assessment Site Shoulder   Right/Left Shoulder Right                  OT Treatments/Exercises (OP) - 06/13/16 0001      Manual Therapy   Manual Therapy Myofascial release   Manual therapy comments manual therapy performed seperately from all other interventions this date   Myofascial Release Myofascial release and manual stretching to right upper arm, shoulder, scapular region and associated areas to decrease pain and improve pain free mobility.               OT Education - 06/13/16 1654    Education provided Yes   Education Details educated patient on HEP for table slides for improved P/ROM of shoulder flexion, abduction, external rotation.     Person(s) Educated Patient   Methods Explanation;Handout;Demonstration   Comprehension Verbalized understanding;Returned demonstration          OT Short Term Goals - 06/13/16 2203      OT SHORT TERM GOAL #1    Title Patient will be educated on HEP for improved right shoulder mobility required for ADL completion.    Time 4   Period Weeks   Status New     OT SHORT TERM GOAL #2   Title Patient will improve right shoulder P/ROM to [facility] in order to improve ease and independence with donning shirts.   Time 4   Period Weeks   Status New     OT SHORT TERM GOAL #3   Title Patient will improve right shoulder strength to 3+/5 for improved ability to reach overhead into cabinets.   Time 4   Period Weeks   Status New     OT SHORT TERM GOAL #4   Title Patient will decrease right shoulder pain to 1/10 during daily tasks.   Time 4   Period Weeks   Status New     OT SHORT TERM GOAL #5   Title Patient will decrease fasical restrictions from moderate to minimal in her right shoulder for better mobility needed for ADLs.   Time 4   Period Weeks   Status New           OT Long Term Goals - 06/13/16 2206      OT LONG TERM GOAL #1   Title Patient will return to prior level of function, utilizing right arm as dominant with her daily tasks.   Time 8   Period Weeks   Status New     OT LONG TERM GOAL #2   Title Patient will improve right shoulder A/ROM to [facility] in order to reach overhead into closets.   Time 8   Period Weeks   Status New     OT LONG TERM GOAL #3   Title Patient will improve right shoulder  strength to 4+/5 for improved ability to pick up her granddaughter.   Time 8   Period Weeks   Status New     OT LONG TERM GOAL #4   Title Patient will decrease fascial restrictions in right shoulder to trace for improved mobility needed for ADL completion.     Time 8   Period Weeks   Status New               Plan - 06/13/16 1659    Clinical Impression Statement A:  Patient is a 51 year old female with medical history significant for bipolar disorder, multiple personality disorder, IBS, anxiety, arthritis, colon cancer, hypertension, back pain, chronic pain syndrome.  Patient  underwent right arthroscopic rotator cuff repair, SAD, DCE on 05/28/16.  Patient is right hand dominant and is not able to complete her B/IADLs, hold her granddaughter, or complete leisure activiites with her dominant right hand.     Rehab Potential Good   OT Frequency 2x / week   OT Duration 8 weeks   OT Treatment/Interventions Self-care/ADL training;Cryotherapy;Electrical Stimulation;Moist Heat;Ultrasound;Therapeutic exercise;Neuromuscular education;Energy conservation;DME and/or AE instruction;Passive range of motion;Manual Therapy;Scar mobilization;Therapeutic exercises;Therapeutic activities;Patient/family education   Plan P: Patient will benefit from skilled OT intervention to decrease pain and restrictions and improve pain free range and strength in her dominant right arm in order to return to her prior level of funciton with daily activities.  Next session:  follow Murphy protocol.  review plan of care, and begin P/ROM, isometric strengthening, scapular A/ROM.     OT Home Exercise Plan 06/13/16:  table slides for self range of motion   Consulted and Agree with Plan of Care Patient      Patient will benefit from skilled therapeutic intervention in order to improve the following deficits and impairments:  Decreased range of motion, Decreased knowledge of precautions, Decreased strength, Increased fascial restricitons, Impaired UE functional use, Pain, Impaired flexibility, Increased muscle spasms  Visit Diagnosis: Acute pain of right shoulder - Plan: Ot plan of care cert/re-cert  Stiffness of right shoulder, not elsewhere classified - Plan: Ot plan of care cert/re-cert  Other symptoms and signs involving the musculoskeletal system - Plan: Ot plan of care cert/re-cert    Problem List Patient Active Problem List   Diagnosis Date Noted  . Acute respiratory failure with hypoxia ([place]) 10/18/2015  . COPD exacerbation ([place]) 10/18/2015  . Drug reaction 07/23/2015  . Fever of unknown origin  07/22/2015  . Lactic acidosis 07/22/2015  . Hypokalemia 07/22/2015  . Nausea and vomiting 07/22/2015  . Chronic back pain 07/22/2015  . Abdominal pain 07/26/2014  . Nausea without vomiting 07/26/2014  . History of colon cancer 07/26/2014  . Iron deficiency anemia 07/07/2014  . Vitamin D deficiency 07/07/2014  . Abnormal nuclear stress test 03/31/2014  . Abnormal cardiac CT angiography 03/31/2014  . Hyperlipidemia 03/24/2014  . Morbid obesity ([place])   . Chest pain of uncertain etiology 38/75/6433  . COPD (chronic obstructive pulmonary disease) ([place]) 03/23/2014  . Leukocytosis 03/23/2014  . Hyperglycemia 03/23/2014  . Bipolar 1 disorder ([place]) 03/23/2014  . Pruritic rash 03/23/2014  . Chronic anxiety 03/23/2014  . Tobacco abuse 03/23/2014  . Intractable migraine without aura and with status migrainosus 01/14/2014  . Sinusitis 07/02/2013  . Urinary frequency 07/02/2013  . Migraine 07/02/2013  . Anxiety state, unspecified 04/14/2013  . Obesity 04/14/2013  . Anemia 04/14/2013  . CONSTIPATION 10/12/2009  . CARCINOMA IN SITU OF RECTUM 08/25/2009  . GERD 08/25/2009  . IRRITABLE  BOWEL SYNDROME 08/25/2009  . DIARRHEA, CHRONIC 08/25/2009  . HEMANGIOMA, HEPATIC 08/24/2009  . POLYCYSTIC OVARIAN DISEASE 08/24/2009  . Personal history of other diseases of digestive system 08/24/2009    Vangie Bicker, [place], OTR/L [phone]  06/13/2016, 10:24 PM  [facility] [address] [place], [place], [address] Phone: [phone]   Fax:  [phone]  Name: Martha Espinoza MRN: [id number] Date of Birth: 04-02-1965

## 2016-06-13 NOTE — Patient Instructions (Signed)
SHOULDER: Flexion On Table   Place hands on table, elbows straight. Slide arms across body. Press hands down into table. Hold _10__ seconds. __25_ reps per set, _2__ sets per day, __7_ days per week  Abduction (Passive)   With arm out to side, resting on table, palm down, slide arm across table leaning into table as you are able.   Hold __10__ seconds. Repeat __25__ times. Do ___2_ sessions per day.  Copyright  VHI. All rights reserved.     Internal Rotation (Assistive)   Seated with elbow bent at right angle and held against side, slide arm on table surface in an inward arc. Repeat __25__ times. Do __2__ sessions per day. Activity: Use this motion to brush crumbs off the table.  Copyright  VHI. All rights reserved.

## 2016-06-14 ENCOUNTER — Ambulatory Visit (HOSPITAL_COMMUNITY): Payer: Commercial Managed Care - PPO

## 2016-06-19 ENCOUNTER — Encounter (HOSPITAL_COMMUNITY): Payer: Self-pay

## 2016-06-19 ENCOUNTER — Ambulatory Visit (HOSPITAL_COMMUNITY): Payer: Commercial Managed Care - PPO

## 2016-06-19 DIAGNOSIS — M25611 Stiffness of right shoulder, not elsewhere classified: Secondary | ICD-10-CM

## 2016-06-19 DIAGNOSIS — M25511 Pain in right shoulder: Secondary | ICD-10-CM | POA: Diagnosis not present

## 2016-06-19 DIAGNOSIS — R29898 Other symptoms and signs involving the musculoskeletal system: Secondary | ICD-10-CM | POA: Diagnosis not present

## 2016-06-19 NOTE — Therapy (Signed)
[place], [place], [address] Phone: [phone]   Fax:  [phone]  Occupational Therapy Treatment  Patient Details  Name: Martha Espinoza MRN: [id number] Date of Birth: 08-13-1965 Referring Provider: Dr. Ninetta Lights  Encounter Date: 06/19/2016      OT End of Session - 06/19/16 1603    Visit Number 2   Number of Visits 16   Date for OT Re-Evaluation 08/12/16  mini reassess on 07/13/16   Authorization Type UHC - 25 visit limit then will need to submit for medical review   Authorization Time Period 25 visits   Authorization - Visit Number 2   Authorization - Number of Visits 25   OT Start Time 1435   OT Stop Time 1515   OT Time Calculation (min) 40 min   Activity Tolerance Patient tolerated treatment well   Behavior During Therapy [facility] for tasks assessed/performed      Past Medical History:  Diagnosis Date  . Anxiety   . Arthritis   . Bipolar 1 disorder ([place])   . Chronic pain syndrome    chronic low back pain (Pain mgmt= Dr. [name])  . Colitis due to Clostridium difficile 2001  . GERD (gastroesophageal reflux disease)   . Hepatic hemangioma   . History of Salmonella gastroenteritis   . Hyperlipidemia    a. Noted 02/2014.  Marland Kitchen Hypertension    HCTZ started 07/2015  . IBS (irritable bowel syndrome)   . Iron deficiency anemia 05/2014   per pt it is not due to vaginal blood loss; hemoccults sent to pt in mail 413/16.  . Lumbar spondylosis    mild, mainly focused at L4-5 and L5-S1.  . Microscopic hematuria    Intermittent (no w/u done yet, as of 03/2014)  . Migraine headache   . Morbid obesity ([place])   . Polycystic ovarian syndrome   . Pruritic dermatitis 2015/2016   +Scabies prep at [facility] 07/2014. (Primarily pruritic skin, but eventually a subtle rash as well)  . Rectal cancer ([place])    a. Followed by Dr. Gala Romney, dx 2000-2001. Tumor removed from rectal.  . Recurrent sinusitis     +allergic rhinitis  . Right shoulder pain 2017   Murphy/Wainer: RC bursitis + RC tear (MRI)--injection trial is plan per pt report 05/04/16.  . Tobacco dependence   . Vitamin D deficiency     Past Surgical History:  Procedure Laterality Date  . CARDIOVASCULAR STRESS TEST  03/24/14   Lexscan MIBI: mild anterior ischemia?  Cardiac CT angiogram recommended/done.  . CHOLECYSTECTOMY    . COLONOSCOPY  05/05/2007   adenoma  . coronary CT angio  03/29/14   Two vessel dz/moderate stenosis of mid LAD and proximal RCA; cath recommended.  Marland Kitchen DILATION AND CURETTAGE OF UTERUS     for vaginal bleeding  . LEFT HEART CATHETERIZATION WITH CORONARY ANGIOGRAM N/A 03/31/2014   Normal coronaries, EF 90-24%, diastolic dysfunction.  Procedure: LEFT HEART CATHETERIZATION WITH CORONARY ANGIOGRAM;  Surgeon: Sinclair Grooms, MD;  Location: [facility] CATH LAB;  Service: Cardiovascular;  Laterality: N/A;  . Situ removed  age 82   High-grade rectal adenoma removed from rectum   . TRANSTHORACIC ECHOCARDIOGRAM  03/23/14   Normal    There were no vitals filed for this visit.      Subjective Assessment - 06/19/16 1459    Subjective  S: This surgery has been so easy. Everyone told me how horrible it was going to  be.    Currently in Pain? No/denies            [facility] OT Assessment - 06/19/16 1605      Assessment   Diagnosis S/P Right Arthroscopic RCR, SAD, DCE     Precautions   Precautions Shoulder   Shoulder Interventions Shoulder sling/immobilizer   Precaution Comments week 2 (3/19) pendulum, long arm rotation, scapular exercises P/ROM flexion as tolerated, abduction to 70, Internal rotation as tolerated, external rotation to 30:  week 3 (3/26):  abduction 90, external rotation at 45 abd and to 45, supine wand, pully, closed chain exercises; week 4 (4/2):  P/AAROM abduction no limit, Ext Rot to 80; week 5 (4/9) add standing AA/ROM, 6 weeks (4/16):  begin AROM and progressive PREs                  OT  Treatments/Exercises (OP) - 06/19/16 1505      Exercises   Exercises Shoulder     Shoulder Exercises: Supine   Protraction PROM;AAROM;10 reps   Horizontal ABduction PROM;AAROM;10 reps   External Rotation PROM;AAROM;10 reps   Internal Rotation PROM;AAROM;10 reps   Flexion PROM;AAROM;10 reps   ABduction PROM;AAROM;10 reps   ABduction Limitations following protocol     Shoulder Exercises: Seated   Elevation AROM;12 reps   Extension AROM;12 reps   Row AROM;12 reps     Shoulder Exercises: Pulleys   Flexion 1 minute   ABduction --  No completed due to ROM protocol.     Shoulder Exercises: Isometric Strengthening   Flexion Supine  3x10"   Extension Supine  310"   External Rotation Supine  3x10"   Internal Rotation Supine  3x10"   ABduction Supine  3x10"   ADduction Supine  3x10"     Manual Therapy   Manual Therapy Myofascial release   Manual therapy comments manual therapy performed seperately from all other interventions this date   Myofascial Release Myofascial release and manual stretching to right upper arm, shoulder, scapular region and associated areas to decrease pain and improve pain free mobility.                OT Education - 06/19/16 1602    Education provided Yes   Education Details Patient was given OT evaluation print out for goal and plan of care reference.    Person(s) Educated Patient   Methods Explanation;Handout   Comprehension Verbalized understanding          OT Short Term Goals - 06/19/16 1509      OT SHORT TERM GOAL #1   Title Patient will be educated on HEP for improved right shoulder mobility required for ADL completion.    Time 4   Period Weeks   Status On-going     OT SHORT TERM GOAL #2   Title Patient will improve right shoulder P/ROM to [facility] in order to improve ease and independence with donning shirts.   Time 4   Period Weeks   Status On-going     OT SHORT TERM GOAL #3   Title Patient will improve right shoulder  strength to 3+/5 for improved ability to reach overhead into cabinets.   Time 4   Period Weeks   Status On-going     OT SHORT TERM GOAL #4   Title Patient will decrease right shoulder pain to 1/10 during daily tasks.   Time 4   Period Weeks   Status On-going     OT SHORT TERM GOAL #5  Title Patient will decrease fasical restrictions from moderate to minimal in her right shoulder for better mobility needed for ADLs.   Time 4   Period Weeks   Status On-going           OT Long Term Goals - 06/19/16 1509      OT LONG TERM GOAL #1   Title Patient will return to prior level of function, utilizing right arm as dominant with her daily tasks.   Time 8   Period Weeks   Status On-going     OT LONG TERM GOAL #2   Title Patient will improve right shoulder A/ROM to [facility] in order to reach overhead into closets.   Time 8   Period Weeks   Status On-going     OT LONG TERM GOAL #3   Title Patient will improve right shoulder strength to 4+/5 for improved ability to pick up her granddaughter.   Time 8   Period Weeks   Status On-going     OT LONG TERM GOAL #4   Title Patient will decrease fascial restrictions in right shoulder to trace for improved mobility needed for ADL completion.     Time 8   Period Weeks   Status On-going               Plan - 06/19/16 1603    Clinical Impression Statement A: initiated myofascial release, manual stretching, P/ROM, isometric exercises while following protocol. patient did complete supine wand exercises per protocol. VC for form and technique and to remain within recommended ROM.   Plan P: Add prone extension to nuetral.       Patient will benefit from skilled therapeutic intervention in order to improve the following deficits and impairments:  Decreased range of motion, Decreased knowledge of precautions, Decreased strength, Increased fascial restricitons, Impaired UE functional use, Pain, Impaired flexibility, Increased muscle  spasms  Visit Diagnosis: Acute pain of right shoulder  Stiffness of right shoulder, not elsewhere classified  Other symptoms and signs involving the musculoskeletal system    Problem List Patient Active Problem List   Diagnosis Date Noted  . Acute respiratory failure with hypoxia ([place]) 10/18/2015  . COPD exacerbation ([place]) 10/18/2015  . Drug reaction 07/23/2015  . Fever of unknown origin 07/22/2015  . Lactic acidosis 07/22/2015  . Hypokalemia 07/22/2015  . Nausea and vomiting 07/22/2015  . Chronic back pain 07/22/2015  . Abdominal pain 07/26/2014  . Nausea without vomiting 07/26/2014  . History of colon cancer 07/26/2014  . Iron deficiency anemia 07/07/2014  . Vitamin D deficiency 07/07/2014  . Abnormal nuclear stress test 03/31/2014  . Abnormal cardiac CT angiography 03/31/2014  . Hyperlipidemia 03/24/2014  . Morbid obesity ([place])   . Chest pain of uncertain etiology 08/65/7846  . COPD (chronic obstructive pulmonary disease) ([place]) 03/23/2014  . Leukocytosis 03/23/2014  . Hyperglycemia 03/23/2014  . Bipolar 1 disorder ([place]) 03/23/2014  . Pruritic rash 03/23/2014  . Chronic anxiety 03/23/2014  . Tobacco abuse 03/23/2014  . Intractable migraine without aura and with status migrainosus 01/14/2014  . Sinusitis 07/02/2013  . Urinary frequency 07/02/2013  . Migraine 07/02/2013  . Anxiety state, unspecified 04/14/2013  . Obesity 04/14/2013  . Anemia 04/14/2013  . CONSTIPATION 10/12/2009  . CARCINOMA IN SITU OF RECTUM 08/25/2009  . GERD 08/25/2009  . IRRITABLE BOWEL SYNDROME 08/25/2009  . DIARRHEA, CHRONIC 08/25/2009  . HEMANGIOMA, HEPATIC 08/24/2009  . POLYCYSTIC OVARIAN DISEASE 08/24/2009  . Personal history of other diseases of digestive system 08/24/2009  Ailene Ravel, OTR/L,CBIS  [phone]  06/19/2016, 4:06 PM  [facility] [address] [place], [place], [address] Phone: [phone]   Fax:   [phone]  Name: Martha Espinoza MRN: [id number] Date of Birth: 07/11/1965

## 2016-06-20 ENCOUNTER — Ambulatory Visit (HOSPITAL_COMMUNITY): Payer: Commercial Managed Care - PPO

## 2016-06-20 DIAGNOSIS — M25511 Pain in right shoulder: Secondary | ICD-10-CM | POA: Diagnosis not present

## 2016-06-20 DIAGNOSIS — M25611 Stiffness of right shoulder, not elsewhere classified: Secondary | ICD-10-CM

## 2016-06-20 DIAGNOSIS — R29898 Other symptoms and signs involving the musculoskeletal system: Secondary | ICD-10-CM | POA: Diagnosis not present

## 2016-06-20 NOTE — Therapy (Signed)
[place], [place], [address] Phone: [phone]   Fax:  [phone]  Occupational Therapy Treatment  Patient Details  Name: Martha Espinoza MRN: [id number] Date of Birth: 10-07-1965 Referring Provider: Dr. Ninetta Lights  Encounter Date: 06/20/2016      OT End of Session - 06/20/16 1524    Visit Number 3   Number of Visits 16   Date for OT Re-Evaluation 08/12/16  mini reassess on 07/13/16   Authorization Type UHC - 25 visit limit then will need to submit for medical review   Authorization Time Period 25 visits   Authorization - Visit Number 3   Authorization - Number of Visits 25   OT Start Time 0737   OT Stop Time 1516   OT Time Calculation (min) 43 min   Activity Tolerance Patient tolerated treatment well   Behavior During Therapy [facility] for tasks assessed/performed      Past Medical History:  Diagnosis Date  . Anxiety   . Arthritis   . Bipolar 1 disorder ([place])   . Chronic pain syndrome    chronic low back pain (Pain mgmt= Dr. [name])  . Colitis due to Clostridium difficile 2001  . GERD (gastroesophageal reflux disease)   . Hepatic hemangioma   . History of Salmonella gastroenteritis   . Hyperlipidemia    a. Noted 02/2014.  Marland Kitchen Hypertension    HCTZ started 07/2015  . IBS (irritable bowel syndrome)   . Iron deficiency anemia 05/2014   per pt it is not due to vaginal blood loss; hemoccults sent to pt in mail 413/16.  . Lumbar spondylosis    mild, mainly focused at L4-5 and L5-S1.  . Microscopic hematuria    Intermittent (no w/u done yet, as of 03/2014)  . Migraine headache   . Morbid obesity ([place])   . Polycystic ovarian syndrome   . Pruritic dermatitis 2015/2016   +Scabies prep at [facility] 07/2014. (Primarily pruritic skin, but eventually a subtle rash as well)  . Rectal cancer ([place])    a. Followed by Dr. Gala Romney, dx 2000-2001. Tumor removed from rectal.  . Recurrent sinusitis     +allergic rhinitis  . Right shoulder pain 2017   Murphy/Wainer: RC bursitis + RC tear (MRI)--injection trial is plan per pt report 05/04/16.  . Tobacco dependence   . Vitamin D deficiency     Past Surgical History:  Procedure Laterality Date  . CARDIOVASCULAR STRESS TEST  03/24/14   Lexscan MIBI: mild anterior ischemia?  Cardiac CT angiogram recommended/done.  . CHOLECYSTECTOMY    . COLONOSCOPY  05/05/2007   adenoma  . coronary CT angio  03/29/14   Two vessel dz/moderate stenosis of mid LAD and proximal RCA; cath recommended.  Marland Kitchen DILATION AND CURETTAGE OF UTERUS     for vaginal bleeding  . LEFT HEART CATHETERIZATION WITH CORONARY ANGIOGRAM N/A 03/31/2014   Normal coronaries, EF 10-62%, diastolic dysfunction.  Procedure: LEFT HEART CATHETERIZATION WITH CORONARY ANGIOGRAM;  Surgeon: Sinclair Grooms, MD;  Location: [facility] CATH LAB;  Service: Cardiovascular;  Laterality: N/A;  . Situ removed  age 34   High-grade rectal adenoma removed from rectum   . TRANSTHORACIC ECHOCARDIOGRAM  03/23/14   Normal    There were no vitals filed for this visit.      Subjective Assessment - 06/20/16 1515    Subjective  S:  My neck was really bothering me last night. I woke up and it was  an 8/10 pain level.   Currently in Pain? Yes   Pain Score 5    Pain Location Neck   Pain Orientation Right   Pain Descriptors / Indicators Aching   Pain Type Acute pain   Pain Radiating Towards N/A   Pain Onset Yesterday   Pain Frequency Constant   Aggravating Factors  Unknown   Pain Relieving Factors rest   Effect of Pain on Daily Activities Min effect            [facility] OT Assessment - 06/20/16 1524      Assessment   Diagnosis S/P Right Arthroscopic RCR, SAD, DCE     Precautions   Precautions Shoulder   Shoulder Interventions Shoulder sling/immobilizer   Precaution Comments week 2 (3/19) pendulum, long arm rotation, scapular exercises P/ROM flexion as tolerated, abduction to 70, Internal rotation as tolerated,  external rotation to 30:  week 3 (3/26):  abduction 90, external rotation at 45 abd and to 45, supine wand, pully, closed chain exercises; week 4 (4/2):  P/AAROM abduction no limit, Ext Rot to 80; week 5 (4/9) add standing AA/ROM, 6 weeks (4/16):  begin AROM and progressive PREs                  OT Treatments/Exercises (OP) - 06/20/16 1508      Exercises   Exercises Shoulder     Shoulder Exercises: Supine   Protraction PROM;10 reps;AAROM;12 reps   Horizontal ABduction PROM;10 reps;AAROM;12 reps   External Rotation PROM;5 reps;AAROM;12 reps   Internal Rotation PROM;10 reps;AAROM;12 reps   Flexion PROM;10 reps;AAROM;15 reps   ABduction PROM;10 reps;AAROM;12 reps  protocol     Shoulder Exercises: Prone   Extension AROM;10 reps   Other Prone Exercises Scapular retraction with elbow extended. Physical assist to immobilize elbow; 10X; A/ROM     Shoulder Exercises: Therapy Ball   Flexion 15 reps   ABduction 15 reps  following protocol                OT Education - 06/19/16 1602    Education provided Yes   Education Details Patient was given OT evaluation print out for goal and plan of care reference.    Person(s) Educated Patient   Methods Explanation;Handout   Comprehension Verbalized understanding          OT Short Term Goals - 06/19/16 1509      OT SHORT TERM GOAL #1   Title Patient will be educated on HEP for improved right shoulder mobility required for ADL completion.    Time 4   Period Weeks   Status On-going     OT SHORT TERM GOAL #2   Title Patient will improve right shoulder P/ROM to [facility] in order to improve ease and independence with donning shirts.   Time 4   Period Weeks   Status On-going     OT SHORT TERM GOAL #3   Title Patient will improve right shoulder strength to 3+/5 for improved ability to reach overhead into cabinets.   Time 4   Period Weeks   Status On-going     OT SHORT TERM GOAL #4   Title Patient will decrease right  shoulder pain to 1/10 during daily tasks.   Time 4   Period Weeks   Status On-going     OT SHORT TERM GOAL #5   Title Patient will decrease fasical restrictions from moderate to minimal in her right shoulder for better mobility needed for ADLs.   Time  4   Period Weeks   Status On-going           OT Long Term Goals - 06/19/16 1509      OT LONG TERM GOAL #1   Title Patient will return to prior level of function, utilizing right arm as dominant with her daily tasks.   Time 8   Period Weeks   Status On-going     OT LONG TERM GOAL #2   Title Patient will improve right shoulder A/ROM to [facility] in order to reach overhead into closets.   Time 8   Period Weeks   Status On-going     OT LONG TERM GOAL #3   Title Patient will improve right shoulder strength to 4+/5 for improved ability to pick up her granddaughter.   Time 8   Period Weeks   Status On-going     OT LONG TERM GOAL #4   Title Patient will decrease fascial restrictions in right shoulder to trace for improved mobility needed for ADL completion.     Time 8   Period Weeks   Status On-going               Plan - 06/20/16 1524    Clinical Impression Statement A: Discussed RUE positioning to determine if patient will be able to lay on her stomach to receive injuections for her back pain. Trailed two different positions with success.    Plan P: Progress to week 4.       Patient will benefit from skilled therapeutic intervention in order to improve the following deficits and impairments:  Decreased range of motion, Decreased knowledge of precautions, Decreased strength, Increased fascial restricitons, Impaired UE functional use, Pain, Impaired flexibility, Increased muscle spasms  Visit Diagnosis: Acute pain of right shoulder  Stiffness of right shoulder, not elsewhere classified  Other symptoms and signs involving the musculoskeletal system    Problem List Patient Active Problem List   Diagnosis Date Noted   . Acute respiratory failure with hypoxia ([place]) 10/18/2015  . COPD exacerbation ([place]) 10/18/2015  . Drug reaction 07/23/2015  . Fever of unknown origin 07/22/2015  . Lactic acidosis 07/22/2015  . Hypokalemia 07/22/2015  . Nausea and vomiting 07/22/2015  . Chronic back pain 07/22/2015  . Abdominal pain 07/26/2014  . Nausea without vomiting 07/26/2014  . History of colon cancer 07/26/2014  . Iron deficiency anemia 07/07/2014  . Vitamin D deficiency 07/07/2014  . Abnormal nuclear stress test 03/31/2014  . Abnormal cardiac CT angiography 03/31/2014  . Hyperlipidemia 03/24/2014  . Morbid obesity ([place])   . Chest pain of uncertain etiology 78/29/5621  . COPD (chronic obstructive pulmonary disease) ([place]) 03/23/2014  . Leukocytosis 03/23/2014  . Hyperglycemia 03/23/2014  . Bipolar 1 disorder ([place]) 03/23/2014  . Pruritic rash 03/23/2014  . Chronic anxiety 03/23/2014  . Tobacco abuse 03/23/2014  . Intractable migraine without aura and with status migrainosus 01/14/2014  . Sinusitis 07/02/2013  . Urinary frequency 07/02/2013  . Migraine 07/02/2013  . Anxiety state, unspecified 04/14/2013  . Obesity 04/14/2013  . Anemia 04/14/2013  . CONSTIPATION 10/12/2009  . CARCINOMA IN SITU OF RECTUM 08/25/2009  . GERD 08/25/2009  . IRRITABLE BOWEL SYNDROME 08/25/2009  . DIARRHEA, CHRONIC 08/25/2009  . HEMANGIOMA, HEPATIC 08/24/2009  . POLYCYSTIC OVARIAN DISEASE 08/24/2009  . Personal history of other diseases of digestive system 08/24/2009   Ailene Ravel, OTR/L,CBIS  [phone]  06/20/2016, 3:57 PM  [place], [place], [address]  Phone: [phone]   Fax:  [phone]  Name: Martha Espinoza MRN: [id number] Date of Birth: [birth year]

## 2016-06-24 DIAGNOSIS — K760 Fatty (change of) liver, not elsewhere classified: Secondary | ICD-10-CM

## 2016-06-24 HISTORY — DX: Fatty (change of) liver, not elsewhere classified: K76.0

## 2016-06-25 ENCOUNTER — Ambulatory Visit (HOSPITAL_COMMUNITY): Payer: Commercial Managed Care - PPO

## 2016-06-25 ENCOUNTER — Telehealth (HOSPITAL_COMMUNITY): Payer: Self-pay | Admitting: Family Medicine

## 2016-06-25 NOTE — Telephone Encounter (Signed)
06/25/16 patient left a message that she needed to cx today, she said that she wasn't feeling good

## 2016-06-27 ENCOUNTER — Telehealth (HOSPITAL_COMMUNITY): Payer: Self-pay | Admitting: Family Medicine

## 2016-06-27 ENCOUNTER — Encounter (HOSPITAL_COMMUNITY): Payer: Self-pay

## 2016-06-27 ENCOUNTER — Ambulatory Visit (HOSPITAL_COMMUNITY): Payer: Commercial Managed Care - PPO | Attending: Orthopedic Surgery

## 2016-06-27 DIAGNOSIS — M25611 Stiffness of right shoulder, not elsewhere classified: Secondary | ICD-10-CM | POA: Diagnosis not present

## 2016-06-27 DIAGNOSIS — M25511 Pain in right shoulder: Secondary | ICD-10-CM

## 2016-06-27 DIAGNOSIS — R29898 Other symptoms and signs involving the musculoskeletal system: Secondary | ICD-10-CM | POA: Insufficient documentation

## 2016-06-27 NOTE — Patient Instructions (Addendum)
(  Home) Extension: Isometric / Bilateral Arm Retraction - Sitting   Facing anchor, hold hands and elbow at shoulder height, with elbow bent.  Pull arms back to squeeze shoulder blades together. Repeat 10-15 times.   (Clinic) Extension / Flexion (Assist)   Face anchor, pull arms back, keeping elbow straight, and squeze shoulder blades together. Repeat 10-15 times.  Copyright  VHI. All rights reserved.   (Home) Retraction: Row - Bilateral (Anchor)   Facing anchor, arms reaching forward, pull hands toward stomach, keeping elbows bent and at your sides and pinching shoulder blades together. Repeat 10-15 times.   4) Internal & External Rotation    Stand with elbows at the side and elbows bent 90 degrees. Move your forearms away from your body, then bring back inward toward the body.        Perform each exercise __12-15______ reps. 2-3x days.   Protraction - STANDING  Start by holding a wand or cane at chest height.  Next, slowly push the wand outwards in front of your body so that your elbows become fully straightened. Then, return to the original position.     Shoulder FLEXION - STANDING - PALMS DOWN  In the standing position, hold a wand/cane with both arms, palms down on both sides. Raise up the wand/cane allowing your unaffected arm to perform most of the effort. Your affected arm should be partially relaxed.      Internal/External ROTATION - STANDING  In the standing position, hold a wand/cane with both hands keeping your elbows bent. Move your arms and wand/cane to one side.  Your affected arm should be partially relaxed while your unaffected arm performs most of the effort.       Shoulder ABDUCTION - STANDING  While holding a wand/cane palm face up on the injured side and palm face down on the uninjured side, slowly raise up your injured arm to the side.      Horizontal Abduction/Adduction      Straight arms holding cane at shoulder height, bring  cane to right, center, left. Repeat starting to left.   Copyright  VHI. All rights reserved.

## 2016-06-27 NOTE — Therapy (Signed)
[place], [place], [address] Phone: [phone]   Fax:  [phone]  Occupational Therapy Treatment  Patient Details  Name: Martha Espinoza MRN: [id number] Date of Birth: April 01, 1965 Referring Provider: Dr. Ninetta Lights  Encounter Date: 06/27/2016      OT End of Session - 06/27/16 1651    Visit Number 4   Number of Visits 16   Date for OT Re-Evaluation 08/12/16  mini reassess on 07/13/16   Authorization Type UHC - 25 visit limit then will need to submit for medical review   Authorization Time Period 25 visits   Authorization - Visit Number 4   Authorization - Number of Visits 25   OT Start Time 6767   OT Stop Time 1647   OT Time Calculation (min) 44 min   Activity Tolerance Patient tolerated treatment well   Behavior During Therapy [facility] for tasks assessed/performed      Past Medical History:  Diagnosis Date  . Anxiety   . Arthritis   . Bipolar 1 disorder ([place])   . Chronic pain syndrome    chronic low back pain (Pain mgmt= Dr. [name])  . Colitis due to Clostridium difficile 2001  . GERD (gastroesophageal reflux disease)   . Hepatic hemangioma   . History of Salmonella gastroenteritis   . Hyperlipidemia    a. Noted 02/2014.  Marland Kitchen Hypertension    HCTZ started 07/2015  . IBS (irritable bowel syndrome)   . Iron deficiency anemia 05/2014   per pt it is not due to vaginal blood loss; hemoccults sent to pt in mail 413/16.  . Lumbar spondylosis    mild, mainly focused at L4-5 and L5-S1.  . Microscopic hematuria    Intermittent (no w/u done yet, as of 03/2014)  . Migraine headache   . Morbid obesity ([place])   . Polycystic ovarian syndrome   . Pruritic dermatitis 2015/2016   +Scabies prep at [facility] 07/2014. (Primarily pruritic skin, but eventually a subtle rash as well)  . Rectal cancer ([place])    a. Followed by Dr. Gala Romney, dx 2000-2001. Tumor removed from rectal.  . Recurrent sinusitis     +allergic rhinitis  . Right shoulder pain 2017   Murphy/Wainer: RC bursitis + RC tear (MRI)--injection trial is plan per pt report 05/04/16.  . Tobacco dependence   . Vitamin D deficiency     Past Surgical History:  Procedure Laterality Date  . CARDIOVASCULAR STRESS TEST  03/24/14   Lexscan MIBI: mild anterior ischemia?  Cardiac CT angiogram recommended/done.  . CHOLECYSTECTOMY    . COLONOSCOPY  05/05/2007   adenoma  . coronary CT angio  03/29/14   Two vessel dz/moderate stenosis of mid LAD and proximal RCA; cath recommended.  Marland Kitchen DILATION AND CURETTAGE OF UTERUS     for vaginal bleeding  . LEFT HEART CATHETERIZATION WITH CORONARY ANGIOGRAM N/A 03/31/2014   Normal coronaries, EF 20-94%, diastolic dysfunction.  Procedure: LEFT HEART CATHETERIZATION WITH CORONARY ANGIOGRAM;  Surgeon: Sinclair Grooms, MD;  Location: [facility] CATH LAB;  Service: Cardiovascular;  Laterality: N/A;  . Situ removed  age 49   High-grade rectal adenoma removed from rectum   . TRANSTHORACIC ECHOCARDIOGRAM  03/23/14   Normal    There were no vitals filed for this visit.      Subjective Assessment - 06/27/16 1621    Subjective  S: I cancelled on Monday because I was just really sore.    Currently in  Pain? Yes   Pain Score 3    Pain Location Shoulder   Pain Orientation Right   Pain Descriptors / Indicators Aching   Pain Type Acute pain   Pain Radiating Towards N/A   Pain Onset Yesterday   Aggravating Factors  Possible bra and sling rubbing.   Pain Relieving Factors rest and ice   Effect of Pain on Daily Activities Min effect   Multiple Pain Sites No            [facility] OT Assessment - 06/27/16 1623      Assessment   Diagnosis S/P Right Arthroscopic RCR, SAD, DCE     Precautions   Precautions Shoulder   Shoulder Interventions Shoulder sling/immobilizer   Precaution Comments week 2 (3/19) pendulum, long arm rotation, scapular exercises P/ROM flexion as tolerated, abduction to 70, Internal rotation as  tolerated, external rotation to 30:  week 3 (3/26):  abduction 90, external rotation at 45 abd and to 45, supine wand, pully, closed chain exercises; week 4 (4/2):  P/AAROM abduction no limit, Ext Rot to 80; week 5 (4/9) add standing AA/ROM, 6 weeks (4/16):  begin AROM and progressive PREs                  OT Treatments/Exercises (OP) - 06/27/16 1624      Exercises   Exercises Shoulder     Shoulder Exercises: Supine   Protraction PROM;10 reps;AAROM;12 reps   Horizontal ABduction PROM;10 reps;AAROM;12 reps   External Rotation PROM;5 reps;AAROM;12 reps   Internal Rotation PROM;10 reps;AAROM;12 reps   Flexion PROM;10 reps;AAROM;15 reps   ABduction PROM;10 reps;AAROM;12 reps   Other Supine Exercises Serratus anterior punch; 2#; 12X     Shoulder Exercises: Seated   Elevation Strengthening;12 reps   Elevation Weight (lbs) 2   Extension Strengthening;12 reps   Extension Weight (lbs) 2   Row Strengthening;12 reps   Row Weight (lbs) 2     Shoulder Exercises: Pulleys   Flexion 1 minute   ABduction 1 minute     Shoulder Exercises: Therapy Ball   Flexion 15 reps   ABduction 15 reps     Manual Therapy   Manual Therapy Myofascial release   Manual therapy comments manual therapy performed seperately from all other interventions this date   Myofascial Release Myofascial release and manual stretching to right upper arm, shoulder, scapular region and associated areas to decrease pain and improve pain free mobility.                  OT Short Term Goals - 06/19/16 1509      OT SHORT TERM GOAL #1   Title Patient will be educated on HEP for improved right shoulder mobility required for ADL completion.    Time 4   Period Weeks   Status On-going     OT SHORT TERM GOAL #2   Title Patient will improve right shoulder P/ROM to [facility] in order to improve ease and independence with donning shirts.   Time 4   Period Weeks   Status On-going     OT SHORT TERM GOAL #3   Title  Patient will improve right shoulder strength to 3+/5 for improved ability to reach overhead into cabinets.   Time 4   Period Weeks   Status On-going     OT SHORT TERM GOAL #4   Title Patient will decrease right shoulder pain to 1/10 during daily tasks.   Time 4   Period Weeks   Status  On-going     OT SHORT TERM GOAL #5   Title Patient will decrease fasical restrictions from moderate to minimal in her right shoulder for better mobility needed for ADLs.   Time 4   Period Weeks   Status On-going           OT Long Term Goals - 06/19/16 1509      OT LONG TERM GOAL #1   Title Patient will return to prior level of function, utilizing right arm as dominant with her daily tasks.   Time 8   Period Weeks   Status On-going     OT LONG TERM GOAL #2   Title Patient will improve right shoulder A/ROM to [facility] in order to reach overhead into closets.   Time 8   Period Weeks   Status On-going     OT LONG TERM GOAL #3   Title Patient will improve right shoulder strength to 4+/5 for improved ability to pick up her granddaughter.   Time 8   Period Weeks   Status On-going     OT LONG TERM GOAL #4   Title Patient will decrease fascial restrictions in right shoulder to trace for improved mobility needed for ADL completion.     Time 8   Period Weeks   Status On-going               Plan - 06/27/16 1650    Clinical Impression Statement A: Added yellow theraband scapular strengthening, serratus anterior punch, extension, row, and elevation with weight. VC for form and technique. No complaints of pain during exercises except supine AA/ROM abduction.   Plan P: Progress to week 5. Complete AA/ROM standing. Add yellow theraband: horizontal abduction and flexion      Patient will benefit from skilled therapeutic intervention in order to improve the following deficits and impairments:  Decreased range of motion, Decreased knowledge of precautions, Decreased strength, Increased fascial  restricitons, Impaired UE functional use, Pain, Impaired flexibility, Increased muscle spasms  Visit Diagnosis: Acute pain of right shoulder  Stiffness of right shoulder, not elsewhere classified  Other symptoms and signs involving the musculoskeletal system    Problem List Patient Active Problem List   Diagnosis Date Noted  . Acute respiratory failure with hypoxia ([place]) 10/18/2015  . COPD exacerbation ([place]) 10/18/2015  . Drug reaction 07/23/2015  . Fever of unknown origin 07/22/2015  . Lactic acidosis 07/22/2015  . Hypokalemia 07/22/2015  . Nausea and vomiting 07/22/2015  . Chronic back pain 07/22/2015  . Abdominal pain 07/26/2014  . Nausea without vomiting 07/26/2014  . History of colon cancer 07/26/2014  . Iron deficiency anemia 07/07/2014  . Vitamin D deficiency 07/07/2014  . Abnormal nuclear stress test 03/31/2014  . Abnormal cardiac CT angiography 03/31/2014  . Hyperlipidemia 03/24/2014  . Morbid obesity ([place])   . Chest pain of uncertain etiology 28/31/5176  . COPD (chronic obstructive pulmonary disease) ([place]) 03/23/2014  . Leukocytosis 03/23/2014  . Hyperglycemia 03/23/2014  . Bipolar 1 disorder ([place]) 03/23/2014  . Pruritic rash 03/23/2014  . Chronic anxiety 03/23/2014  . Tobacco abuse 03/23/2014  . Intractable migraine without aura and with status migrainosus 01/14/2014  . Sinusitis 07/02/2013  . Urinary frequency 07/02/2013  . Migraine 07/02/2013  . Anxiety state, unspecified 04/14/2013  . Obesity 04/14/2013  . Anemia 04/14/2013  . CONSTIPATION 10/12/2009  . CARCINOMA IN SITU OF RECTUM 08/25/2009  . GERD 08/25/2009  . IRRITABLE BOWEL SYNDROME 08/25/2009  . DIARRHEA, CHRONIC 08/25/2009  . HEMANGIOMA, HEPATIC  08/24/2009  . POLYCYSTIC OVARIAN DISEASE 08/24/2009  . Personal history of other diseases of digestive system 08/24/2009   Ailene Ravel, OTR/L,CBIS  [phone]  06/27/2016, 4:56 PM  [place] [address] [place], [place], [address] Phone: [phone]   Fax:  [phone]  Name: Martha Espinoza MRN: [id number] Date of Birth: March 02, 1966

## 2016-06-27 NOTE — Telephone Encounter (Signed)
06/27/16 I spoke to patient about perhaps coming in at 11:15 and she said that she couldn't

## 2016-07-02 ENCOUNTER — Telehealth (HOSPITAL_COMMUNITY): Payer: Self-pay

## 2016-07-02 ENCOUNTER — Ambulatory Visit: Payer: Commercial Managed Care - PPO | Admitting: Family Medicine

## 2016-07-02 NOTE — Telephone Encounter (Signed)
Talked to patient she agreed to change the 11th to the 12th. NF 07/02/16

## 2016-07-04 ENCOUNTER — Ambulatory Visit (HOSPITAL_COMMUNITY): Payer: Commercial Managed Care - PPO

## 2016-07-05 ENCOUNTER — Ambulatory Visit (HOSPITAL_COMMUNITY): Payer: Commercial Managed Care - PPO | Admitting: Occupational Therapy

## 2016-07-05 ENCOUNTER — Telehealth (HOSPITAL_COMMUNITY): Payer: Self-pay | Admitting: Family Medicine

## 2016-07-05 NOTE — Telephone Encounter (Signed)
07/05/16 pt cx because she said that her truck won't start

## 2016-07-06 ENCOUNTER — Ambulatory Visit (HOSPITAL_COMMUNITY): Payer: Commercial Managed Care - PPO

## 2016-07-06 ENCOUNTER — Encounter (HOSPITAL_COMMUNITY): Payer: Self-pay

## 2016-07-06 ENCOUNTER — Ambulatory Visit (HOSPITAL_COMMUNITY)
Admission: RE | Admit: 2016-07-06 | Discharge: 2016-07-06 | Disposition: A | Discharge status: Home / Self Care | Payer: Commercial Managed Care - PPO | Source: Ambulatory Visit | Attending: Family Medicine | Admitting: Family Medicine

## 2016-07-06 DIAGNOSIS — M25511 Pain in right shoulder: Secondary | ICD-10-CM | POA: Diagnosis not present

## 2016-07-06 DIAGNOSIS — R05 Cough: Secondary | ICD-10-CM | POA: Diagnosis not present

## 2016-07-06 DIAGNOSIS — R062 Wheezing: Secondary | ICD-10-CM | POA: Insufficient documentation

## 2016-07-06 DIAGNOSIS — R29898 Other symptoms and signs involving the musculoskeletal system: Secondary | ICD-10-CM | POA: Diagnosis not present

## 2016-07-06 DIAGNOSIS — K760 Fatty (change of) liver, not elsewhere classified: Secondary | ICD-10-CM | POA: Diagnosis not present

## 2016-07-06 DIAGNOSIS — I2510 Atherosclerotic heart disease of native coronary artery without angina pectoris: Secondary | ICD-10-CM | POA: Diagnosis not present

## 2016-07-06 DIAGNOSIS — I700 Atherosclerosis of aorta: Secondary | ICD-10-CM | POA: Insufficient documentation

## 2016-07-06 DIAGNOSIS — M25611 Stiffness of right shoulder, not elsewhere classified: Secondary | ICD-10-CM | POA: Diagnosis not present

## 2016-07-06 DIAGNOSIS — R053 Chronic cough: Secondary | ICD-10-CM

## 2016-07-06 DIAGNOSIS — R0602 Shortness of breath: Secondary | ICD-10-CM | POA: Diagnosis not present

## 2016-07-06 DIAGNOSIS — M899 Disorder of bone, unspecified: Secondary | ICD-10-CM | POA: Insufficient documentation

## 2016-07-06 DIAGNOSIS — R0600 Dyspnea, unspecified: Secondary | ICD-10-CM | POA: Diagnosis not present

## 2016-07-06 NOTE — Patient Instructions (Signed)
ELASTIC BAND - HORIZONTAL ABDUCTION  Start by holding an elastic band or tubing with your arm out-stretched in front of you and across your body towards the opposite side.  Next, pull the elastic band or cord horizontally and outward as shown.   Your elbow should be straight or slightly bent the entire time.   12-15 times.    ELASTIC BAND SHOULDER FLEXION  While holding an elastic band at your side, draw up your arm up in front of you keeping your elbow straight.  12-15 times.

## 2016-07-06 NOTE — Therapy (Signed)
[place], [place], [address] Phone: [phone]   Fax:  [phone]  Occupational Therapy Treatment  Patient Details  Name: Martha Espinoza MRN: [id number] Date of Birth: 08-22-1965 Referring Provider: Dr. Ninetta Lights  Encounter Date: 07/06/2016      OT End of Session - 07/06/16 1331    Visit Number 5   Number of Visits 16   Date for OT Re-Evaluation 08/12/16  mini reassess on 07/13/16   Authorization Type UHC - 25 visit limit then will need to submit for medical review   Authorization Time Period 25 visits   Authorization - Visit Number 5   Authorization - Number of Visits 25   OT Start Time 4650  Pt was in bathroom   OT Stop Time 1345   OT Time Calculation (min) 37 min   Activity Tolerance Patient tolerated treatment well   Behavior During Therapy [facility] for tasks assessed/performed      Past Medical History:  Diagnosis Date  . Anxiety   . Arthritis   . Bipolar 1 disorder ([place])   . Chronic pain syndrome    chronic low back pain (Pain mgmt= Dr. [name])  . Colitis due to Clostridium difficile 2001  . GERD (gastroesophageal reflux disease)   . Hepatic hemangioma   . History of Salmonella gastroenteritis   . Hyperlipidemia    a. Noted 02/2014.  Marland Kitchen Hypertension    HCTZ started 07/2015  . IBS (irritable bowel syndrome)   . Iron deficiency anemia 05/2014   per pt it is not due to vaginal blood loss; hemoccults sent to pt in mail 413/16.  . Lumbar spondylosis    mild, mainly focused at L4-5 and L5-S1.  . Microscopic hematuria    Intermittent (no w/u done yet, as of 03/2014)  . Migraine headache   . Morbid obesity ([place])   . Polycystic ovarian syndrome   . Pruritic dermatitis 2015/2016   +Scabies prep at [facility] 07/2014. (Primarily pruritic skin, but eventually a subtle rash as well)  . Rectal cancer ([place])    a. Followed by Dr. Gala Romney, dx 2000-2001. Tumor removed from rectal.  . Recurrent  sinusitis    +allergic rhinitis  . Right shoulder pain 2017   Murphy/Wainer: RC bursitis + RC tear (MRI)--injection trial is plan per pt report 05/04/16.  . Tobacco dependence   . Vitamin D deficiency     Past Surgical History:  Procedure Laterality Date  . CARDIOVASCULAR STRESS TEST  03/24/14   Lexscan MIBI: mild anterior ischemia?  Cardiac CT angiogram recommended/done.  . CHOLECYSTECTOMY    . COLONOSCOPY  05/05/2007   adenoma  . coronary CT angio  03/29/14   Two vessel dz/moderate stenosis of mid LAD and proximal RCA; cath recommended.  Marland Kitchen DILATION AND CURETTAGE OF UTERUS     for vaginal bleeding  . LEFT HEART CATHETERIZATION WITH CORONARY ANGIOGRAM N/A 03/31/2014   Normal coronaries, EF 35-46%, diastolic dysfunction.  Procedure: LEFT HEART CATHETERIZATION WITH CORONARY ANGIOGRAM;  Surgeon: Sinclair Grooms, MD;  Location: [facility] CATH LAB;  Service: Cardiovascular;  Laterality: N/A;  . Situ removed  age 4   High-grade rectal adenoma removed from rectum   . TRANSTHORACIC ECHOCARDIOGRAM  03/23/14   Normal    There were no vitals filed for this visit.      Subjective Assessment - 07/06/16 1323    Subjective  S: I see the Doctor on the 20th.   Currently  in Pain? No/denies            [facility] OT Assessment - 07/06/16 1323      Assessment   Diagnosis S/P Right Arthroscopic RCR, SAD, DCE     Precautions   Precautions Shoulder   Precaution Comments week 2 (3/19) pendulum, long arm rotation, scapular exercises P/ROM flexion as tolerated, abduction to 70, Internal rotation as tolerated, external rotation to 30:  week 3 (3/26):  abduction 90, external rotation at 45 abd and to 45, supine wand, pully, closed chain exercises; week 4 (4/2):  P/AAROM abduction no limit, Ext Rot to 80; week 5 (4/9) add standing AA/ROM, 6 weeks (4/16):  begin AROM and progressive PREs                  OT Treatments/Exercises (OP) - 07/06/16 1323      Exercises   Exercises Shoulder     Shoulder  Exercises: Supine   Protraction PROM;5 reps;AAROM;15 reps   Horizontal ABduction PROM;5 reps;AAROM;15 reps   External Rotation PROM;5 reps;AAROM;15 reps   Internal Rotation PROM;5 reps;AAROM;15 reps   Flexion PROM;5 reps;AAROM;15 reps   ABduction PROM;5 reps;AAROM;15 reps     Shoulder Exercises: Standing   Protraction AAROM;15 reps   Horizontal ABduction AAROM;15 reps;Theraband;12 reps   Theraband Level (Shoulder Horizontal ABduction) Level 1 (Yellow)   External Rotation AAROM;15 reps   Internal Rotation AAROM;15 reps   Flexion AAROM;15 reps   ABduction AAROM;15 reps     Shoulder Exercises: ROM/Strengthening   Wall Wash 1'   Proximal Shoulder Strengthening, Supine 15X no rest breaks   Proximal Shoulder Strengthening, Seated 15X no rest breaks     Manual Therapy   Manual Therapy Myofascial release   Manual therapy comments manual therapy performed seperately from all other interventions this date   Myofascial Release Myofascial release and manual stretching to right upper arm, shoulder, scapular region and associated areas to decrease pain and improve pain free mobility.                  OT Short Term Goals - 06/19/16 1509      OT SHORT TERM GOAL #1   Title Patient will be educated on HEP for improved right shoulder mobility required for ADL completion.    Time 4   Period Weeks   Status On-going     OT SHORT TERM GOAL #2   Title Patient will improve right shoulder P/ROM to [facility] in order to improve ease and independence with donning shirts.   Time 4   Period Weeks   Status On-going     OT SHORT TERM GOAL #3   Title Patient will improve right shoulder strength to 3+/5 for improved ability to reach overhead into cabinets.   Time 4   Period Weeks   Status On-going     OT SHORT TERM GOAL #4   Title Patient will decrease right shoulder pain to 1/10 during daily tasks.   Time 4   Period Weeks   Status On-going     OT SHORT TERM GOAL #5   Title Patient will  decrease fasical restrictions from moderate to minimal in her right shoulder for better mobility needed for ADLs.   Time 4   Period Weeks   Status On-going           OT Long Term Goals - 06/19/16 1509      OT LONG TERM GOAL #1   Title Patient will return to prior level of  function, utilizing right arm as dominant with her daily tasks.   Time 8   Period Weeks   Status On-going     OT LONG TERM GOAL #2   Title Patient will improve right shoulder A/ROM to [facility] in order to reach overhead into closets.   Time 8   Period Weeks   Status On-going     OT LONG TERM GOAL #3   Title Patient will improve right shoulder strength to 4+/5 for improved ability to pick up her granddaughter.   Time 8   Period Weeks   Status On-going     OT LONG TERM GOAL #4   Title Patient will decrease fascial restrictions in right shoulder to trace for improved mobility needed for ADL completion.     Time 8   Period Weeks   Status On-going               Plan - 07/06/16 1352    Clinical Impression Statement A: Added yellow theraband shoulder strengthening for horizontal abduction and flexion. patient did report some back pain with shoulder flexion and therapist recommended a modification to decrease amount of back pain. VC for form and technique as needed.    Plan P: Progress to week 6. Complete A/ROM supine and standing. Add UBE bike. Add X to V arms. Update HEP. Increase to red theraband.      Patient will benefit from skilled therapeutic intervention in order to improve the following deficits and impairments:  Decreased range of motion, Decreased knowledge of precautions, Decreased strength, Increased fascial restricitons, Impaired UE functional use, Pain, Impaired flexibility, Increased muscle spasms  Visit Diagnosis: Acute pain of right shoulder  Stiffness of right shoulder, not elsewhere classified  Other symptoms and signs involving the musculoskeletal system    Problem List Patient  Active Problem List   Diagnosis Date Noted  . Acute respiratory failure with hypoxia ([place]) 10/18/2015  . COPD exacerbation ([place]) 10/18/2015  . Drug reaction 07/23/2015  . Fever of unknown origin 07/22/2015  . Lactic acidosis 07/22/2015  . Hypokalemia 07/22/2015  . Nausea and vomiting 07/22/2015  . Chronic back pain 07/22/2015  . Abdominal pain 07/26/2014  . Nausea without vomiting 07/26/2014  . History of colon cancer 07/26/2014  . Iron deficiency anemia 07/07/2014  . Vitamin D deficiency 07/07/2014  . Abnormal nuclear stress test 03/31/2014  . Abnormal cardiac CT angiography 03/31/2014  . Hyperlipidemia 03/24/2014  . Morbid obesity ([place])   . Chest pain of uncertain etiology 04/03/3233  . COPD (chronic obstructive pulmonary disease) ([place]) 03/23/2014  . Leukocytosis 03/23/2014  . Hyperglycemia 03/23/2014  . Bipolar 1 disorder ([place]) 03/23/2014  . Pruritic rash 03/23/2014  . Chronic anxiety 03/23/2014  . Tobacco abuse 03/23/2014  . Intractable migraine without aura and with status migrainosus 01/14/2014  . Sinusitis 07/02/2013  . Urinary frequency 07/02/2013  . Migraine 07/02/2013  . Anxiety state, unspecified 04/14/2013  . Obesity 04/14/2013  . Anemia 04/14/2013  . CONSTIPATION 10/12/2009  . CARCINOMA IN SITU OF RECTUM 08/25/2009  . GERD 08/25/2009  . IRRITABLE BOWEL SYNDROME 08/25/2009  . DIARRHEA, CHRONIC 08/25/2009  . HEMANGIOMA, HEPATIC 08/24/2009  . POLYCYSTIC OVARIAN DISEASE 08/24/2009  . Personal history of other diseases of digestive system 08/24/2009   Ailene Ravel, OTR/L,CBIS  [phone]  07/06/2016, 2:09 PM  [place], [place], [address] Phone: [phone]   Fax:  [phone]  Name: NEOMIA HERBEL MRN: [id number] Date of Birth: January 07, 1966

## 2016-07-09 ENCOUNTER — Encounter (HOSPITAL_COMMUNITY): Payer: Self-pay

## 2016-07-09 ENCOUNTER — Ambulatory Visit (HOSPITAL_COMMUNITY): Payer: Commercial Managed Care - PPO

## 2016-07-09 DIAGNOSIS — M25511 Pain in right shoulder: Secondary | ICD-10-CM | POA: Diagnosis not present

## 2016-07-09 DIAGNOSIS — M25611 Stiffness of right shoulder, not elsewhere classified: Secondary | ICD-10-CM | POA: Diagnosis not present

## 2016-07-09 DIAGNOSIS — R29898 Other symptoms and signs involving the musculoskeletal system: Secondary | ICD-10-CM

## 2016-07-09 NOTE — Therapy (Signed)
[place], [place], [address] Phone: [phone]   Fax:  [phone]  Occupational Therapy Treatment  Patient Details  Name: Martha Espinoza MRN: [id number] Date of Birth: [birth year] Referring Provider: Dr. Ninetta Lights  Encounter Date: 07/09/2016      OT End of Session - 07/09/16 1210    Visit Number 6   Number of Visits 16   Date for OT Re-Evaluation 08/12/16  mini reassess on 07/13/16   Authorization Type UHC - 25 visit limit then will need to submit for medical review   Authorization Time Period 25 visits   Authorization - Visit Number 6   Authorization - Number of Visits 25   OT Start Time 1120   OT Stop Time 1200   OT Time Calculation (min) 40 min   Activity Tolerance Patient tolerated treatment well   Behavior During Therapy [facility] for tasks assessed/performed      Past Medical History:  Diagnosis Date  . Anxiety   . Arthritis   . Bipolar 1 disorder ([place])   . Chronic pain syndrome    chronic low back pain (Pain mgmt= Dr. [name])  . Colitis due to Clostridium difficile 2001  . GERD (gastroesophageal reflux disease)   . Hepatic hemangioma   . History of Salmonella gastroenteritis   . Hyperlipidemia    a. Noted 02/2014.  Marland Kitchen Hypertension    HCTZ started 07/2015  . IBS (irritable bowel syndrome)   . Iron deficiency anemia 05/2014   per pt it is not due to vaginal blood loss; hemoccults sent to pt in mail 413/16.  . Lumbar spondylosis    mild, mainly focused at L4-5 and L5-S1.  . Microscopic hematuria    Intermittent (no w/u done yet, as of 03/2014)  . Migraine headache   . Morbid obesity ([place])   . Polycystic ovarian syndrome   . Pruritic dermatitis 2015/2016   +Scabies prep at [facility] 07/2014. (Primarily pruritic skin, but eventually a subtle rash as well)  . Rectal cancer ([place])    a. Followed by Dr. Gala Romney, dx 2000-2001. Tumor removed from rectal.  . Recurrent sinusitis     +allergic rhinitis  . Right shoulder pain 2017   Murphy/Wainer: RC bursitis + RC tear (MRI)--injection trial is plan per pt report 05/04/16.  . Tobacco dependence   . Vitamin D deficiency     Past Surgical History:  Procedure Laterality Date  . CARDIOVASCULAR STRESS TEST  03/24/14   Lexscan MIBI: mild anterior ischemia?  Cardiac CT angiogram recommended/done.  . CHOLECYSTECTOMY    . COLONOSCOPY  05/05/2007   adenoma  . coronary CT angio  03/29/14   Two vessel dz/moderate stenosis of mid LAD and proximal RCA; cath recommended.  Marland Kitchen DILATION AND CURETTAGE OF UTERUS     for vaginal bleeding  . LEFT HEART CATHETERIZATION WITH CORONARY ANGIOGRAM N/A 03/31/2014   Normal coronaries, EF 84-16%, diastolic dysfunction.  Procedure: LEFT HEART CATHETERIZATION WITH CORONARY ANGIOGRAM;  Surgeon: Sinclair Grooms, MD;  Location: [facility] CATH LAB;  Service: Cardiovascular;  Laterality: N/A;  . Situ removed  age 14   High-grade rectal adenoma removed from rectum   . TRANSTHORACIC ECHOCARDIOGRAM  03/23/14   Normal    There were no vitals filed for this visit.      Subjective Assessment - 07/09/16 1138    Subjective  S: My back has just been killing me since the CT scan on Friday.  Currently in Pain? Yes   Pain Score 8    Pain Location Back   Pain Orientation Lower   Pain Descriptors / Indicators Aching;Contraction   Pain Type Chronic pain   Pain Radiating Towards N/A   Pain Onset Yesterday   Pain Frequency Constant                      OT Treatments/Exercises (OP) - 07/09/16 1140      Exercises   Exercises Shoulder     Shoulder Exercises: Supine   Protraction PROM;5 reps;AROM;10 reps   Horizontal ABduction PROM;5 reps;AROM;10 reps   External Rotation PROM;5 reps;AROM;10 reps   Internal Rotation PROM;5 reps;AROM;10 reps   Flexion PROM;5 reps;AROM;10 reps   Flexion Limitations Pain with upward motion   ABduction PROM;5 reps;AROM;10 reps     Shoulder Exercises: Seated    Protraction AROM;10 reps   Horizontal ABduction AROM;10 reps   External Rotation AROM;10 reps   Internal Rotation AROM;10 reps   Flexion AROM;10 reps   Abduction AROM;10 reps     Shoulder Exercises: ROM/Strengthening   "W" Arms 10X   X to V Arms 10X   Proximal Shoulder Strengthening, Supine 15X no rest breaks  unable to complete circles due to pain   Proximal Shoulder Strengthening, Seated 15X no rest breaks     Manual Therapy   Manual Therapy Myofascial release   Manual therapy comments manual therapy performed seperately from all other interventions this date   Myofascial Release Myofascial release and manual stretching to right upper arm, shoulder, scapular region and associated areas to decrease pain and improve pain free mobility.                  OT Short Term Goals - 06/19/16 1509      OT SHORT TERM GOAL #1   Title Patient will be educated on HEP for improved right shoulder mobility required for ADL completion.    Time 4   Period Weeks   Status On-going     OT SHORT TERM GOAL #2   Title Patient will improve right shoulder P/ROM to [facility] in order to improve ease and independence with donning shirts.   Time 4   Period Weeks   Status On-going     OT SHORT TERM GOAL #3   Title Patient will improve right shoulder strength to 3+/5 for improved ability to reach overhead into cabinets.   Time 4   Period Weeks   Status On-going     OT SHORT TERM GOAL #4   Title Patient will decrease right shoulder pain to 1/10 during daily tasks.   Time 4   Period Weeks   Status On-going     OT SHORT TERM GOAL #5   Title Patient will decrease fasical restrictions from moderate to minimal in her right shoulder for better mobility needed for ADLs.   Time 4   Period Weeks   Status On-going           OT Long Term Goals - 06/19/16 1509      OT LONG TERM GOAL #1   Title Patient will return to prior level of function, utilizing right arm as dominant with her daily tasks.    Time 8   Period Weeks   Status On-going     OT LONG TERM GOAL #2   Title Patient will improve right shoulder A/ROM to [facility] in order to reach overhead into closets.   Time 8  Period Weeks   Status On-going     OT LONG TERM GOAL #3   Title Patient will improve right shoulder strength to 4+/5 for improved ability to pick up her granddaughter.   Time 8   Period Weeks   Status On-going     OT LONG TERM GOAL #4   Title Patient will decrease fascial restrictions in right shoulder to trace for improved mobility needed for ADL completion.     Time 8   Period Weeks   Status On-going               Plan - 07/09/16 1215    Clinical Impression Statement A: Progressed to week 6 and complete A/ROM exercises supine and seated. Due to low back pain, we were unable to complete any standing exercises. Pt was given moist heat for low back during session. Pt was given an updated HEP.  Pt did experience some pain during shoulder flexion around 90 degrees when supine although when seated she reports that the pain was less.    Plan P: Follow up on HEP. Continue with week 6. Increase to red theraband. Measure for MD appointment.      Patient will benefit from skilled therapeutic intervention in order to improve the following deficits and impairments:  Decreased range of motion, Decreased knowledge of precautions, Decreased strength, Increased fascial restricitons, Impaired UE functional use, Pain, Impaired flexibility, Increased muscle spasms  Visit Diagnosis: Acute pain of right shoulder  Stiffness of right shoulder, not elsewhere classified  Other symptoms and signs involving the musculoskeletal system    Problem List Patient Active Problem List   Diagnosis Date Noted  . Acute respiratory failure with hypoxia ([place]) 10/18/2015  . COPD exacerbation ([place]) 10/18/2015  . Drug reaction 07/23/2015  . Fever of unknown origin 07/22/2015  . Lactic acidosis 07/22/2015  . Hypokalemia 07/22/2015   . Nausea and vomiting 07/22/2015  . Chronic back pain 07/22/2015  . Abdominal pain 07/26/2014  . Nausea without vomiting 07/26/2014  . History of colon cancer 07/26/2014  . Iron deficiency anemia 07/07/2014  . Vitamin D deficiency 07/07/2014  . Abnormal nuclear stress test 03/31/2014  . Abnormal cardiac CT angiography 03/31/2014  . Hyperlipidemia 03/24/2014  . Morbid obesity ([place])   . Chest pain of uncertain etiology 65/78/4696  . COPD (chronic obstructive pulmonary disease) ([place]) 03/23/2014  . Leukocytosis 03/23/2014  . Hyperglycemia 03/23/2014  . Bipolar 1 disorder ([place]) 03/23/2014  . Pruritic rash 03/23/2014  . Chronic anxiety 03/23/2014  . Tobacco abuse 03/23/2014  . Intractable migraine without aura and with status migrainosus 01/14/2014  . Sinusitis 07/02/2013  . Urinary frequency 07/02/2013  . Migraine 07/02/2013  . Anxiety state, unspecified 04/14/2013  . Obesity 04/14/2013  . Anemia 04/14/2013  . CONSTIPATION 10/12/2009  . CARCINOMA IN SITU OF RECTUM 08/25/2009  . GERD 08/25/2009  . IRRITABLE BOWEL SYNDROME 08/25/2009  . DIARRHEA, CHRONIC 08/25/2009  . HEMANGIOMA, HEPATIC 08/24/2009  . POLYCYSTIC OVARIAN DISEASE 08/24/2009  . Personal history of other diseases of digestive system 08/24/2009    Ailene Ravel, OTR/L,CBIS  [phone]  07/09/2016, 12:27 PM  [place] [address] [place], [place], [address] Phone: [phone]   Fax:  [phone]  Name: AARILYN DYE MRN: [id number] Date of Birth: 1965/12/10

## 2016-07-09 NOTE — Patient Instructions (Signed)
1) Shoulder Protraction    Begin with elbows by your side, slowly "punch" straight out in front of you.      2) Shoulder Flexion  Standing:         Begin with arms at your side with thumbs pointed up, slowly raise both arms up and forward towards overhead.       3) Horizontal abduction/adduction   Standing:           Begin with arms straight out in front of you, bring out to the side in at "T" shape. Keep arms straight entire time.       4) Internal & External Rotation    *No band* -Stand with elbows at the side and elbows bent 90 degrees. Move your forearms away from your body, then bring back inward toward the body.     5) Shoulder Abduction  Standing:       Lying on your back begin with your arms flat on the table next to your side. Slowly move your arms out to the side so that they go overhead, in a jumping jack or snow angel movement.    6) X to V arms (cheerleader move):  Begin with arms straight down, crossed in front of body in an "X". Keeping arms crossed, lift arms straight up overhead. Then spread arms apart into a "V" shape.  Bring back together into x and lower down to starting position.     Repeat all exercises 10-15 times, 1-2 times per day.  

## 2016-07-10 ENCOUNTER — Encounter: Payer: Self-pay | Admitting: Family Medicine

## 2016-07-10 ENCOUNTER — Other Ambulatory Visit: Payer: Self-pay | Admitting: Family Medicine

## 2016-07-10 DIAGNOSIS — Z1239 Encounter for other screening for malignant neoplasm of breast: Secondary | ICD-10-CM

## 2016-07-10 MED ORDER — VARENICLINE TARTRATE 1 MG PO TABS
1.0000 mg | ORAL_TABLET | Freq: Two times a day (BID) | ORAL | 1 refills | Status: DC
Start: 2016-07-10 — End: 2016-07-27

## 2016-07-10 MED ORDER — VARENICLINE TARTRATE 0.5 MG X 11 & 1 MG X 42 PO MISC: ORAL | 0 refills | Status: DC

## 2016-07-10 MED ORDER — CYCLOBENZAPRINE HCL 10 MG PO TABS: ORAL_TABLET | ORAL | 5 refills | Status: AC

## 2016-07-11 ENCOUNTER — Encounter (HOSPITAL_COMMUNITY): Payer: Self-pay | Admitting: Occupational Therapy

## 2016-07-11 ENCOUNTER — Ambulatory Visit (HOSPITAL_COMMUNITY): Payer: Commercial Managed Care - PPO | Admitting: Occupational Therapy

## 2016-07-11 DIAGNOSIS — R29898 Other symptoms and signs involving the musculoskeletal system: Secondary | ICD-10-CM | POA: Diagnosis not present

## 2016-07-11 DIAGNOSIS — M25511 Pain in right shoulder: Secondary | ICD-10-CM

## 2016-07-11 DIAGNOSIS — M25611 Stiffness of right shoulder, not elsewhere classified: Secondary | ICD-10-CM | POA: Diagnosis not present

## 2016-07-11 DIAGNOSIS — G43701 Chronic migraine without aura, not intractable, with status migrainosus: Secondary | ICD-10-CM | POA: Diagnosis not present

## 2016-07-11 DIAGNOSIS — M545 Low back pain: Secondary | ICD-10-CM | POA: Diagnosis not present

## 2016-07-11 NOTE — Therapy (Addendum)
[place], [place], [address] Phone: [phone]   Fax:  [phone]  Occupational Therapy Treatment  Patient Details  Name: Martha Espinoza MRN: [id number] Date of Birth: 1965-04-24 Referring Provider: Dr. Ninetta Lights  Encounter Date: 07/11/2016      OT End of Session - 07/11/16 1613    Visit Number 7   Number of Visits 16   Date for OT Re-Evaluation 08/12/16  mini reassess on 07/13/16   Authorization Type [employer] - 25 visit limit then will need to submit for medical review   Authorization Time Period 25 visits   Authorization - Visit Number 7   Authorization - Number of Visits 25   OT Start Time 9242   OT Stop Time 1507  short session due to back pain   OT Time Calculation (min) 30 min   Activity Tolerance Patient tolerated treatment well   Behavior During Therapy [facility] for tasks assessed/performed      Past Medical History:  Diagnosis Date  . Anxiety   . Arthritis   . Bipolar 1 disorder ([place])   . Chronic pain syndrome    chronic low back pain (Pain mgmt= Dr. [name])  . Colitis due to Clostridium difficile 2001  . GERD (gastroesophageal reflux disease)   . Hepatic hemangioma   . Hepatic steatosis 06/2016   Noted incidentally on CT chest  . History of Salmonella gastroenteritis   . Hyperlipidemia    a. Noted 02/2014.  Marland Kitchen Hypertension    HCTZ started 07/2015  . IBS (irritable bowel syndrome)   . Iron deficiency anemia 05/2014   per pt it is not due to vaginal blood loss; hemoccults sent to pt in mail 413/16.  . Lumbar spondylosis    mild, mainly focused at L4-5 and L5-S1.  . Microscopic hematuria    Intermittent (no w/u done yet, as of 03/2014)  . Migraine headache   . Morbid obesity ([place])   . Polycystic ovarian syndrome   . Pruritic dermatitis 2015/2016   +Scabies prep at [facility] 07/2014. (Primarily pruritic skin, but eventually a subtle rash as well)  . Rectal cancer ([place])    a.  Followed by Dr. Gala Romney, dx 2000-2001. Tumor removed from rectal.  . Recurrent sinusitis    +allergic rhinitis  . Right shoulder pain 2017   Murphy/Wainer: RC bursitis + RC tear (MRI)--injection trial is plan per pt report 05/04/16.  . Tobacco dependence   . Vitamin D deficiency     Past Surgical History:  Procedure Laterality Date  . CARDIOVASCULAR STRESS TEST  03/24/14   Lexscan MIBI: mild anterior ischemia?  Cardiac CT angiogram recommended/done.  . CHOLECYSTECTOMY    . COLONOSCOPY  05/05/2007   adenoma  . coronary CT angio  03/29/14   Two vessel dz/moderate stenosis of mid LAD and proximal RCA; cath recommended.  Marland Kitchen DILATION AND CURETTAGE OF UTERUS     for vaginal bleeding  . LEFT HEART CATHETERIZATION WITH CORONARY ANGIOGRAM N/A 03/31/2014   Normal coronaries, EF 68-34%, diastolic dysfunction.  Procedure: LEFT HEART CATHETERIZATION WITH CORONARY ANGIOGRAM;  Surgeon: Sinclair Grooms, MD;  Location: [facility] CATH LAB;  Service: Cardiovascular;  Laterality: N/A;  . Situ removed  age 7   High-grade rectal adenoma removed from rectum   . TRANSTHORACIC ECHOCARDIOGRAM  03/23/14   Normal    There were no vitals filed for this visit.      Subjective Assessment - 07/11/16 1437  Subjective  S: I don't know if I'll be able to do much of anything today.    Currently in Pain? Yes   Pain Score 4    Pain Location Shoulder   Pain Orientation Right   Pain Descriptors / Indicators Aching   Pain Type Acute pain   Pain Radiating Towards n/a   Pain Onset 1 to 4 weeks ago   Pain Frequency Constant   Aggravating Factors  movement   Pain Relieving Factors rest and ice   Effect of Pain on Daily Activities min effect on use of RUE with functional tasks   Multiple Pain Sites Yes   Pain Score 9   Pain Location Back   Pain Orientation Lower   Pain Descriptors / Indicators Aching;Contraction   Pain Type Chronic pain   Pain Radiating Towards none   Pain Onset In the past 7 days   Pain Frequency  Constant   Aggravating Factors  movement   Pain Relieving Factors nothing   Effect of Pain on Daily Activities min effect            [facility] OT Assessment - 07/11/16 1436      Assessment   Diagnosis S/P Right Arthroscopic RCR, SAD, DCE     Precautions   Precautions Shoulder   Precaution Comments week 2 (3/19) pendulum, long arm rotation, scapular exercises P/ROM flexion as tolerated, abduction to 70, Internal rotation as tolerated, external rotation to 30:  week 3 (3/26):  abduction 90, external rotation at 45 abd and to 45, supine wand, pully, closed chain exercises; week 4 (4/2):  P/AAROM abduction no limit, Ext Rot to 80; week 5 (4/9) add standing AA/ROM, 6 weeks (4/16):  begin AROM and progressive PREs     AROM   Right Shoulder Flexion 180 Degrees  not previously assessed   Right Shoulder ABduction 180 Degrees  not previously assessed   Right Shoulder Internal Rotation 90 Degrees  not previously assessed   Right Shoulder External Rotation 90 Degrees  not previously assessed     PROM   Right Shoulder Flexion 178 Degrees  105 previous   Right Shoulder ABduction 180 Degrees  95 previous   Right Shoulder Internal Rotation 90 Degrees  same as previous   Right Shoulder External Rotation 90 Degrees  75 previous     Strength   Right Shoulder Flexion 4/5  not previously assessed   Right Shoulder Extension --  not previously assessed   Right Shoulder ABduction 4/5  not previously assessed   Right Shoulder Internal Rotation 4/5  not previously assessed   Right Shoulder External Rotation 4-/5  not previously assessed                  OT Treatments/Exercises (OP) - 07/11/16 1454      Exercises   Exercises Shoulder     Shoulder Exercises: Seated   Protraction AROM;10 reps   Horizontal ABduction AROM;10 reps   External Rotation AROM;10 reps   Internal Rotation AROM;10 reps   Flexion AROM;10 reps   Abduction AROM;10 reps     Shoulder Exercises:  ROM/Strengthening   UBE (Upper Arm Bike) Level 1 2' forward 2' reverse   "W" Arms 10X   X to V Arms 10X     Manual Therapy   Manual Therapy Myofascial release   Manual therapy comments manual therapy performed seperately from all other interventions this date   Myofascial Release Myofascial release and manual stretching to right upper arm, shoulder, scapular  region and associated areas to decrease pain and improve pain free mobility.                  OT Short Term Goals - 06/19/16 1509      OT SHORT TERM GOAL #1   Title Patient will be educated on HEP for improved right shoulder mobility required for ADL completion.    Time 4   Period Weeks   Status Met     OT SHORT TERM GOAL #2   Title Patient will improve right shoulder P/ROM to [facility] in order to improve ease and independence with donning shirts.   Time 4   Period Weeks   Status Met     OT SHORT TERM GOAL #3   Title Patient will improve right shoulder strength to 3+/5 for improved ability to reach overhead into cabinets.   Time 4   Period Weeks   Status Met     OT SHORT TERM GOAL #4   Title Patient will decrease right shoulder pain to 1/10 during daily tasks.   Time 4   Period Weeks   Status Not Met     OT SHORT TERM GOAL #5   Title Patient will decrease fasical restrictions from moderate to minimal in her right shoulder for better mobility needed for ADLs.   Time 4   Period Weeks   Status Not Met           OT Long Term Goals - 06/19/16 1509      OT LONG TERM GOAL #1   Title Patient will return to prior level of function, utilizing right arm as dominant with her daily tasks.   Time 8   Period Weeks   Status Not met     OT LONG TERM GOAL #2   Title Patient will improve right shoulder A/ROM to [facility] in order to reach overhead into closets.   Time 8   Period Weeks   Status Met     OT LONG TERM GOAL #3   Title Patient will improve right shoulder strength to 4+/5 for improved ability to pick up her  granddaughter.   Time 8   Period Weeks   Status Not Met     OT LONG TERM GOAL #4   Title Patient will decrease fascial restrictions in right shoulder to trace for improved mobility needed for ADL completion.     Time 8   Period Weeks   Status Not Met               Plan - 07/11/16 1613    Clinical Impression Statement A: Pt reports severe back pain this session limiting exercises she is able to tolerate. Measurements taken for MD appt, pt has made signficant gains in ROM and strength since evaluation. Pt able to tolerate seated exercises with moist heat pack placed at lower back.    Plan P: Follow up on MD appt, add red theraband if pt able to tolerate   OT Home Exercise Plan 06/13/16:  table slides for self range of motion   Consulted and Agree with Plan of Care Patient      Patient will benefit from skilled therapeutic intervention in order to improve the following deficits and impairments:  Decreased range of motion, Decreased knowledge of precautions, Decreased strength, Increased fascial restricitons, Impaired UE functional use, Pain, Impaired flexibility, Increased muscle spasms  Visit Diagnosis: Acute pain of right shoulder  Stiffness of right shoulder, not elsewhere classified  Other symptoms  and signs involving the musculoskeletal system    Problem List Patient Active Problem List   Diagnosis Date Noted  . Acute respiratory failure with hypoxia ([place]) 10/18/2015  . COPD exacerbation ([place]) 10/18/2015  . Drug reaction 07/23/2015  . Fever of unknown origin 07/22/2015  . Lactic acidosis 07/22/2015  . Hypokalemia 07/22/2015  . Nausea and vomiting 07/22/2015  . Chronic back pain 07/22/2015  . Abdominal pain 07/26/2014  . Nausea without vomiting 07/26/2014  . History of colon cancer 07/26/2014  . Iron deficiency anemia 07/07/2014  . Vitamin D deficiency 07/07/2014  . Abnormal nuclear stress test 03/31/2014  . Abnormal cardiac CT angiography 03/31/2014  .  Hyperlipidemia 03/24/2014  . Morbid obesity ([place])   . Chest pain of uncertain etiology 15/94/5859  . COPD (chronic obstructive pulmonary disease) ([place]) 03/23/2014  . Leukocytosis 03/23/2014  . Hyperglycemia 03/23/2014  . Bipolar 1 disorder ([place]) 03/23/2014  . Pruritic rash 03/23/2014  . Chronic anxiety 03/23/2014  . Tobacco abuse 03/23/2014  . Intractable migraine without aura and with status migrainosus 01/14/2014  . Sinusitis 07/02/2013  . Urinary frequency 07/02/2013  . Migraine 07/02/2013  . Anxiety state, unspecified 04/14/2013  . Obesity 04/14/2013  . Anemia 04/14/2013  . CONSTIPATION 10/12/2009  . CARCINOMA IN SITU OF RECTUM 08/25/2009  . GERD 08/25/2009  . IRRITABLE BOWEL SYNDROME 08/25/2009  . DIARRHEA, CHRONIC 08/25/2009  . HEMANGIOMA, HEPATIC 08/24/2009  . POLYCYSTIC OVARIAN DISEASE 08/24/2009  . Personal history of other diseases of digestive system 08/24/2009   Guadelupe Sabin, OTR/L  [phone] 07/11/2016, 4:17 PM  [place], [place], [address] Phone: [phone]   Fax:  [phone]  Name: Martha Espinoza MRN: [id number] Date of Birth: [birth year]    OCCUPATIONAL THERAPY DISCHARGE SUMMARY  Visits from Start of Care: 7  Current functional level related to goals / functional outcomes: Pt has met 3/5 STGs and 1/5 LTGs. Pt has made improvements in ROM and strength, is requesting to discharge due to MD instructions. Pt is using RUE as dominant for daily tasks.    Remaining deficits: Pt continues to have pain in RUE   Education / Equipment: No new education provided as pt did not return for additional appointments.  Plan: Patient agrees to discharge.  Patient goals were partially met. Patient is being discharged due to being pleased with the current functional level.  ?????

## 2016-07-16 ENCOUNTER — Ambulatory Visit (HOSPITAL_COMMUNITY): Payer: Commercial Managed Care - PPO | Admitting: Specialist

## 2016-07-16 ENCOUNTER — Other Ambulatory Visit: Payer: Self-pay | Admitting: Family Medicine

## 2016-07-16 ENCOUNTER — Telehealth: Payer: Self-pay | Admitting: Family Medicine

## 2016-07-16 ENCOUNTER — Telehealth (HOSPITAL_COMMUNITY): Payer: Self-pay | Admitting: Family Medicine

## 2016-07-16 DIAGNOSIS — Z1231 Encounter for screening mammogram for malignant neoplasm of breast: Secondary | ICD-10-CM

## 2016-07-16 NOTE — Telephone Encounter (Signed)
07/16/16 pt said that her dr told her to give her back a rest... So we cx this weeks appts. And she will resume next week

## 2016-07-16 NOTE — Telephone Encounter (Signed)
Patient checking on Chantix Rx prior auth

## 2016-07-16 NOTE — Telephone Encounter (Signed)
Patient notified that her insurance plan denied Chantix, stating that it is not covered under her plan.  Patient stated that she would check on line and with pharmacy for coupons to see if this will help with the cost. Dr. Anitra Lauth notified.

## 2016-07-18 ENCOUNTER — Ambulatory Visit (HOSPITAL_COMMUNITY): Payer: Commercial Managed Care - PPO | Admitting: Occupational Therapy

## 2016-07-19 ENCOUNTER — Telehealth (HOSPITAL_COMMUNITY): Payer: Self-pay | Admitting: Specialist

## 2016-07-19 ENCOUNTER — Ambulatory Visit (HOSPITAL_COMMUNITY)
Admission: RE | Admit: 2016-07-19 | Discharge: 2016-07-19 | Disposition: A | Discharge status: Home / Self Care | Payer: Commercial Managed Care - PPO | Source: Ambulatory Visit | Attending: Family Medicine | Admitting: Family Medicine

## 2016-07-19 DIAGNOSIS — Z1231 Encounter for screening mammogram for malignant neoplasm of breast: Secondary | ICD-10-CM

## 2016-07-19 NOTE — Telephone Encounter (Signed)
Pt agreed to come in at 1:45. NF 07/19/16

## 2016-07-23 ENCOUNTER — Telehealth (HOSPITAL_COMMUNITY): Payer: Self-pay | Admitting: Specialist

## 2016-07-23 ENCOUNTER — Ambulatory Visit (HOSPITAL_COMMUNITY): Payer: Commercial Managed Care - PPO | Admitting: Specialist

## 2016-07-23 NOTE — Telephone Encounter (Signed)
Called patient regarding missed appointment this date.  Patient stated her back has been bothering her and that there had been a death in her family. Patient states she has an MD follow up appointment tomorrow.  She will go to her MD appointment and call us to let us know if she needs to continue therapy or be dc. Vangie Bicker, [place], OTR/L [phone]

## 2016-07-24 ENCOUNTER — Telehealth: Payer: Self-pay | Admitting: Family Medicine

## 2016-07-24 ENCOUNTER — Telehealth (HOSPITAL_COMMUNITY): Payer: Self-pay

## 2016-07-24 DIAGNOSIS — Z01419 Encounter for gynecological examination (general) (routine) without abnormal findings: Secondary | ICD-10-CM | POA: Diagnosis not present

## 2016-07-24 NOTE — Telephone Encounter (Signed)
Patient aware.

## 2016-07-24 NOTE — Telephone Encounter (Signed)
It was normal. There radiology department where she got her mammogram always mails a letter to the patient with results, so she should expect to get this any time now.-thx

## 2016-07-24 NOTE — Telephone Encounter (Signed)
Patient called to see if we received result of mammogram yet?  Please advise.

## 2016-07-24 NOTE — Telephone Encounter (Signed)
Pt called requeted to be D/C per Wt. NF 07/24/16

## 2016-07-25 ENCOUNTER — Encounter (HOSPITAL_COMMUNITY): Payer: Commercial Managed Care - PPO

## 2016-07-25 ENCOUNTER — Encounter (HOSPITAL_COMMUNITY): Payer: Commercial Managed Care - PPO | Admitting: Occupational Therapy

## 2016-07-27 ENCOUNTER — Ambulatory Visit (INDEPENDENT_AMBULATORY_CARE_PROVIDER_SITE_OTHER): Payer: Commercial Managed Care - PPO | Admitting: Family Medicine

## 2016-07-27 ENCOUNTER — Encounter: Payer: Self-pay | Admitting: Family Medicine

## 2016-07-27 VITALS — BP 144/86 | HR 103 | Temp 97.9°F | Resp 18 | Wt 250.5 lb

## 2016-07-27 DIAGNOSIS — J411 Mucopurulent chronic bronchitis: Secondary | ICD-10-CM

## 2016-07-27 DIAGNOSIS — J0101 Acute recurrent maxillary sinusitis: Secondary | ICD-10-CM | POA: Diagnosis not present

## 2016-07-27 DIAGNOSIS — F17200 Nicotine dependence, unspecified, uncomplicated: Secondary | ICD-10-CM | POA: Diagnosis not present

## 2016-07-27 MED ORDER — AMOXICILLIN 875 MG PO TABS: 875.0000 mg | ORAL_TABLET | Freq: Two times a day (BID) | ORAL | 0 refills | Status: DC

## 2016-07-27 MED ORDER — PREDNISONE 20 MG PO TABS: ORAL_TABLET | ORAL | 0 refills | Status: DC

## 2016-07-27 MED ORDER — BUPROPION HCL ER (XL) 150 MG PO TB24: ORAL_TABLET | ORAL | 1 refills | Status: DC

## 2016-07-27 NOTE — Progress Notes (Signed)
Pre visit review using our clinic review tool, if applicable. No additional management support is needed unless otherwise documented below in the visit note. 

## 2016-07-27 NOTE — Progress Notes (Signed)
OFFICE VISIT  07/27/2016  CC:  Chief Complaint  Patient presents with  . URI    head and chest congestion, cough, fever, sore throat x 4-5 days   HPI:    Patient is a 51 y.o. Caucasian female who presents for respiratory symptoms. Onset 5 d/a with nasal congestion/runny nose, with PND and throat discomfort.  Cough started, started getting fever to 101 for a couple of days, but last 2d, no fever.  R side of face swelling when woke up one morning.  +HA. Alka seltzer sinus/allergy.  Takes flonase and zyrtec daily. Albuterol inhaler use helped her cough up some mucous and breath easier. Still smoking 1/2 pack per day, can't afford chantix. She has never tried wellbutrin for smoking cessation. Feels lots of stress, nonstop battle with depression--this leads her to smoke. Eating and drinking normally but admits her usual fluid intake is poor.    Past Medical History:  Diagnosis Date  . Anxiety   . Arthritis   . Bipolar 1 disorder ([place])   . Bronchitis, mucopurulent recurrent ([place])    PFTs 04/2016 showed no sign of copd or asthma, plus she actually had worse FEV1 after albuterol.  . Chronic pain syndrome    chronic low back pain (Pain mgmt= Dr. [name])  . Colitis due to Clostridium difficile 2001  . GERD (gastroesophageal reflux disease)   . Hepatic hemangioma   . Hepatic steatosis 06/2016   Noted incidentally on CT chest  . History of Salmonella gastroenteritis   . Hyperlipidemia    a. Noted 02/2014.  Marland Kitchen Hypertension    HCTZ started 07/2015  . IBS (irritable bowel syndrome)   . Iron deficiency anemia 05/2014   per pt it is not due to vaginal blood loss; hemoccults sent to pt in mail 413/16.  . Lumbar spondylosis    mild, mainly focused at L4-5 and L5-S1.  . Microscopic hematuria    Intermittent (no w/u done yet, as of 03/2014)  . Migraine headache   . Morbid obesity ([place])   . Polycystic ovarian syndrome   . Pruritic dermatitis 2015/2016   +Scabies prep at [facility]  07/2014. (Primarily pruritic skin, but eventually a subtle rash as well)  . Rectal cancer ([place])    a. Followed by Dr. Gala Romney, dx 2000-2001. Tumor removed from rectal.  . Recurrent sinusitis    +allergic rhinitis  . Right shoulder pain 2017   Murphy/Wainer: RC bursitis + RC tear (MRI)--injection trial is plan per pt report 05/04/16.  . Tobacco dependence   . Vitamin D deficiency     Past Surgical History:  Procedure Laterality Date  . CARDIOVASCULAR STRESS TEST  03/24/14   Lexscan MIBI: mild anterior ischemia?  Cardiac CT angiogram recommended/done.  . CHOLECYSTECTOMY    . COLONOSCOPY  05/05/2007   adenoma  . coronary CT angio  03/29/14   Two vessel dz/moderate stenosis of mid LAD and proximal RCA; cath recommended.  Marland Kitchen DILATION AND CURETTAGE OF UTERUS     for vaginal bleeding  . JOINT REPLACEMENT    . LEFT HEART CATHETERIZATION WITH CORONARY ANGIOGRAM N/A 03/31/2014   Normal coronaries, EF 71-24%, diastolic dysfunction.  Procedure: LEFT HEART CATHETERIZATION WITH CORONARY ANGIOGRAM;  Surgeon: Sinclair Grooms, MD;  Location: [facility] CATH LAB;  Service: Cardiovascular;  Laterality: N/A;  . PFTs  04/2016   No sign of COPD or asthma; FEV 1 worse after albut.  . Situ removed  age 56   High-grade rectal adenoma removed from rectum   .  TRANSTHORACIC ECHOCARDIOGRAM  03/23/14   Normal    Outpatient Medications Prior to Visit  Medication Sig Dispense Refill  . aspirin 81 MG chewable tablet Chew 1 tablet (81 mg total) by mouth daily. 14 tablet 0  . busPIRone (BUSPAR) 15 MG tablet 1 in the morning and 2 at bedtime    . cetirizine (ZYRTEC) 10 MG tablet TAKE ONE TABLET BY MOUTH ONCE DAILY 30 tablet 11  . clonazePAM (KLONOPIN) 0.5 MG tablet TAKE ONE TABLET BY MOUTH ONCE DAILY DURING THE DAY AS NEEDED, THEN TAKE TWO TABLETS AT BEDTIME 90 tablet 5  . cyclobenzaprine (FLEXERIL) 10 MG tablet TAKE ONE TABLET BY MOUTH EVERY 6 HOURS AS NEEDED (SPASM RELATED TO HEADACHES) 42 tablet 5  . diphenhydrAMINE (BENADRYL)  25 MG tablet Take 1 tablet (25 mg total) by mouth every 6 (six) hours. (Patient taking differently: Take 25-50 mg by mouth every 6 (six) hours as needed for itching or allergies. ) 20 tablet 0  . Docusate Calcium (STOOL SOFTENER PO) Take 3 tablets by mouth at bedtime.    . fluticasone (FLONASE) 50 MCG/ACT nasal spray Place 2 sprays into both nostrils daily. 16 g 1  . HYDROcodone-acetaminophen (NORCO) 10-325 MG tablet Take 1 tablet by mouth. Every 8-12 hours as needed    . hydrOXYzine (ATARAX/VISTARIL) 25 MG tablet Take 25 mg by mouth 3 (three) times daily.     Marland Kitchen ibuprofen (ADVIL,MOTRIN) 800 MG tablet Take 1 tablet (800 mg total) by mouth 3 (three) times daily. 21 tablet 0  . IRON-FOLIC ACID PO Take 65 mg by mouth daily.     Marland Kitchen lamoTRIgine (LAMICTAL) 200 MG tablet Take 200 mg by mouth at bedtime.    . naloxegol oxalate (MOVANTIK) 25 MG TABS tablet Take by mouth daily.    . nitroGLYCERIN (NITROSTAT) 0.4 MG SL tablet Place 1 tablet (0.4 mg total) under the tongue every 5 (five) minutes as needed for chest pain. 20 tablet 0  . pantoprazole (PROTONIX) 40 MG tablet TAKE ONE TABLET BY MOUTH ONCE DAILY 90 tablet 3  . pregabalin (LYRICA) 75 MG capsule Take 75 mg by mouth 3 (three) times daily.     . promethazine (PHENERGAN) 25 MG tablet Take 1 tablet (25 mg total) by mouth every 6 (six) hours as needed for nausea or vomiting. 30 tablet 2  . rOPINIRole (REQUIP) 0.25 MG tablet Take 3 tablets (0.75 mg total) by mouth at bedtime. max of 2 mg 90 tablet 6  . varenicline (CHANTIX CONTINUING MONTH PAK) 1 MG tablet Take 1 tablet (1 mg total) by mouth 2 (two) times daily. 60 tablet 1  . varenicline (CHANTIX STARTING MONTH PAK) 0.5 MG X 11 & 1 MG X 42 tablet Take one 0.5 mg tablet by mouth once daily for 3 days, then increase to one 0.5 mg tablet twice daily for 4 days, then increase to one 1 mg tablet twice daily. 53 tablet 0   No facility-administered medications prior to visit.     Allergies  Allergen Reactions   . Contrast Media [Iodinated Diagnostic Agents] Anaphylaxis    Needs 13-hour prep  . Shellfish Allergy Anaphylaxis  . Codeine Nausea And Vomiting and Other (See Comments)    Hallucinations, Also sees people that are not there   . Ivermectin Nausea And Vomiting    N/V and rash  . Betadine [Povidone Iodine] Rash  . Latex Hives  . Lipitor [Atorvastatin] Itching  . Permethrin Itching  . Tape Dermatitis    Paper tape and  clear    ROS As per HPI  PE: Blood pressure (!) 144/86, pulse (!) 103, temperature 97.9 F (36.6 C), temperature source Oral, resp. rate 18, weight 250 lb 8 oz (113.6 kg), last menstrual period 07/06/2016, SpO2 96 %.  The room has a VERY STRONG smell of cigarettes. Gen: Alert, tired- appearing.  Patient is oriented to person, place, time, and situation. AFFECT: pleasant, lucid thought and speech. HEENT: eyes without injection, drainage, or swelling.  Ears: EACs clear, TMs with normal light reflex and landmarks.  Nose: Clear rhinorrhea, with some dried, crusty exudate adherent to mildly injected mucosa.  No purulent d/c.  No paranasal sinus TTP.  No facial swelling.  Throat and mouth without focal lesion.  No pharyngial swelling, erythema, or exudate.   Neck: supple, no LAD.   LUNGS: CTA bilat, nonlabored resps.  On forced exp maneuver she has faint exp wheezing, diffusely diminished aeration bilat, and prolonged exp phase. CV: Regular rhythm, mild tachycardia, no m/r/g. EXT: no c/c/e SKIN: no rash   LABS:    Chemistry      Component Value Date/Time   NA 138 10/24/2015 1554   K 4.4 10/24/2015 1554   CL 106 10/24/2015 1554   CO2 23 10/24/2015 1554   BUN 14 10/24/2015 1554   CREATININE 0.80 10/24/2015 1554      Component Value Date/Time   CALCIUM 8.9 10/24/2015 1554   ALKPHOS 37 (L) 10/20/2015 0210   AST 12 (L) 10/20/2015 0210   ALT 10 (L) 10/20/2015 0210   BILITOT 0.3 10/20/2015 0210      IMPRESSION AND PLAN:  1) Acute sinusitis: infectious and  allergic components. Continue flonase and zyrtec. Prednisone rx'd as below. Amoxil 875mg  bid x 10d. Tobacco smoke also contributing to upper and lower resp sx's.  2) Recurrent bronchitis: No doubt tobacco abuse contributing to her susceptibility to this condition. Strongly encouraged smoking cessation. Prednisone 40mg  qd x 5d, then 20mg  qd x 5d, then 10mg  qd x4d. Amoxil 875mg  bid x 10d.  3) Tobacco dependence: pt could not afford chantix and insurance would not cover it. She states she has never tried wellbutrin so we'll start wellbutrin XL 150mg  qd. Therapeutic expectations and side effect profile of medication discussed today.  Patient's questions answered.  An After Visit Summary was printed and given to the patient.  FOLLOW UP: Return in about 6 weeks (around 09/07/2016) for smoking cessation.  Signed:  Crissie Sickles, MD           07/27/2016

## 2016-07-30 ENCOUNTER — Encounter (HOSPITAL_COMMUNITY): Payer: Commercial Managed Care - PPO

## 2016-08-01 ENCOUNTER — Encounter (HOSPITAL_COMMUNITY): Payer: Commercial Managed Care - PPO

## 2016-08-02 ENCOUNTER — Other Ambulatory Visit: Payer: Self-pay | Admitting: Family Medicine

## 2016-08-06 ENCOUNTER — Other Ambulatory Visit: Payer: Self-pay | Admitting: *Deleted

## 2016-08-06 ENCOUNTER — Encounter (HOSPITAL_COMMUNITY): Payer: Commercial Managed Care - PPO | Admitting: Specialist

## 2016-08-06 MED ORDER — PROMETHAZINE HCL 25 MG PO TABS: 25.0000 mg | ORAL_TABLET | Freq: Four times a day (QID) | ORAL | 2 refills | Status: DC | PRN

## 2016-08-06 NOTE — Telephone Encounter (Signed)
[facility] [place].  RF request for promethazine LOV: 07/27/16 Next ov: 09/07/16 Last written: 01/18/16 #30 w/ 2RF  Please advise. Thanks.

## 2016-08-08 ENCOUNTER — Ambulatory Visit (HOSPITAL_COMMUNITY): Payer: Commercial Managed Care - PPO | Admitting: Occupational Therapy

## 2016-08-08 DIAGNOSIS — M25511 Pain in right shoulder: Secondary | ICD-10-CM | POA: Diagnosis not present

## 2016-08-08 DIAGNOSIS — M545 Low back pain: Secondary | ICD-10-CM | POA: Diagnosis not present

## 2016-08-08 DIAGNOSIS — G43701 Chronic migraine without aura, not intractable, with status migrainosus: Secondary | ICD-10-CM | POA: Diagnosis not present

## 2016-08-21 ENCOUNTER — Emergency Department (HOSPITAL_COMMUNITY): Payer: Commercial Managed Care - PPO

## 2016-08-21 ENCOUNTER — Emergency Department (HOSPITAL_COMMUNITY)
Admission: EM | Admit: 2016-08-21 | Discharge: 2016-08-21 | Disposition: A | Discharge status: Home / Self Care | Payer: Commercial Managed Care - PPO | Attending: Emergency Medicine | Admitting: Emergency Medicine

## 2016-08-21 ENCOUNTER — Encounter (HOSPITAL_COMMUNITY): Payer: Self-pay | Admitting: Emergency Medicine

## 2016-08-21 DIAGNOSIS — Z79899 Other long term (current) drug therapy: Secondary | ICD-10-CM | POA: Diagnosis not present

## 2016-08-21 DIAGNOSIS — Z7982 Long term (current) use of aspirin: Secondary | ICD-10-CM | POA: Diagnosis not present

## 2016-08-21 DIAGNOSIS — Z91040 Latex allergy status: Secondary | ICD-10-CM | POA: Insufficient documentation

## 2016-08-21 DIAGNOSIS — Z85040 Personal history of malignant carcinoid tumor of rectum: Secondary | ICD-10-CM | POA: Diagnosis not present

## 2016-08-21 DIAGNOSIS — F17210 Nicotine dependence, cigarettes, uncomplicated: Secondary | ICD-10-CM | POA: Diagnosis not present

## 2016-08-21 DIAGNOSIS — J449 Chronic obstructive pulmonary disease, unspecified: Secondary | ICD-10-CM | POA: Insufficient documentation

## 2016-08-21 DIAGNOSIS — I10 Essential (primary) hypertension: Secondary | ICD-10-CM | POA: Insufficient documentation

## 2016-08-21 DIAGNOSIS — G8929 Other chronic pain: Secondary | ICD-10-CM | POA: Insufficient documentation

## 2016-08-21 DIAGNOSIS — M79631 Pain in right forearm: Secondary | ICD-10-CM | POA: Diagnosis not present

## 2016-08-21 DIAGNOSIS — M545 Low back pain: Secondary | ICD-10-CM | POA: Diagnosis not present

## 2016-08-21 MED ORDER — DIAZEPAM 5 MG PO TABS
10.0000 mg | ORAL_TABLET | Freq: Once | ORAL | Status: AC
  Administered 2016-08-21: 10 mg via ORAL
  Filled 2016-08-21: qty 2

## 2016-08-21 MED ORDER — LIDOCAINE 5 % EX PTCH
1.0000 | MEDICATED_PATCH | CUTANEOUS | 0 refills | Status: DC
Start: 2016-08-21 — End: 2016-10-08

## 2016-08-21 MED ORDER — KETOROLAC TROMETHAMINE 60 MG/2ML IM SOLN
60.0000 mg | Freq: Once | INTRAMUSCULAR | Status: AC
  Administered 2016-08-21: 60 mg via INTRAMUSCULAR
  Filled 2016-08-21: qty 2

## 2016-08-21 NOTE — Discharge Instructions (Signed)
Follow-up with your primary doctor or with your orthopedic doctor for recheck.  You can wear the sling as needed, but do not wear the ACE wrap continuously and do not wear either to bed.  Return here for any worsening symptoms

## 2016-08-21 NOTE — ED Notes (Signed)
Pt alert & oriented x4, stable gait. Patient given discharge instructions, paperwork & prescription(s). Patient  instructed to stop at the registration desk to finish any additional paperwork. Patient verbalized understanding. Pt left department w/ no further questions. 

## 2016-08-21 NOTE — ED Provider Notes (Signed)
[facility] DEPT Provider Note   CSN: [id number] Arrival date & time: 08/21/16  1506     History   Chief Complaint Chief Complaint  Patient presents with  . Arm Pain  . Back Pain    chronic    HPI Martha Espinoza is a 51 y.o. female.  HPI  MASIEL GENTZLER is a 51 y.o. female who presents to the Emergency Department complaining of right forearm pain and worsening of her chronic low back pain for nearly one week.  She states that she is followed by pain management for her back pain, but reports gradually increasing pain across her lower back and pain with movement and picking up objects with the right hand.  She describes aching pain to her forearm that improves with elevation and application of heat.  States her hydrocodone is not helping control the pain.  She contacted her pain management provider and advised to come the ER for evaluation.  She denies recent injury, numbness or weakness of the extremities, neck pain, shoulder pain, fever, abd pain, urine or bowel changes.    Past Medical History:  Diagnosis Date  . Anxiety   . Arthritis   . Bipolar 1 disorder ([place])   . Bronchitis, mucopurulent recurrent ([place])    PFTs 04/2016 showed no sign of copd or asthma, plus she actually had worse FEV1 after albuterol.  . Chronic pain syndrome    chronic low back pain (Pain mgmt= Dr. [name])  . Colitis due to Clostridium difficile 2001  . GERD (gastroesophageal reflux disease)   . Hepatic hemangioma   . Hepatic steatosis 06/2016   Noted incidentally on CT chest  . History of Salmonella gastroenteritis   . Hyperlipidemia    a. Noted 02/2014.  Marland Kitchen Hypertension    HCTZ started 07/2015  . IBS (irritable bowel syndrome)   . Iron deficiency anemia 05/2014   per pt it is not due to vaginal blood loss; hemoccults sent to pt in mail 413/16.  . Lumbar spondylosis    mild, mainly focused at L4-5 and L5-S1.  . Microscopic hematuria    Intermittent (no w/u done yet, as of 03/2014)  .  Migraine headache   . Morbid obesity ([place])   . Polycystic ovarian syndrome   . Pruritic dermatitis 2015/2016   +Scabies prep at [facility] 07/2014. (Primarily pruritic skin, but eventually a subtle rash as well)  . Rectal cancer ([place])    a. Followed by Dr. Gala Romney, dx 2000-2001. Tumor removed from rectal.  . Recurrent sinusitis    +allergic rhinitis  . Right shoulder pain 2017   Murphy/Wainer: RC bursitis + RC tear (MRI)--injection trial is plan per pt report 05/04/16.  . Tobacco dependence   . Vitamin D deficiency     Patient Active Problem List   Diagnosis Date Noted  . Acute respiratory failure with hypoxia ([place]) 10/18/2015  . COPD exacerbation ([place]) 10/18/2015  . Drug reaction 07/23/2015  . Fever of unknown origin 07/22/2015  . Lactic acidosis 07/22/2015  . Hypokalemia 07/22/2015  . Nausea and vomiting 07/22/2015  . Chronic back pain 07/22/2015  . Abdominal pain 07/26/2014  . Nausea without vomiting 07/26/2014  . History of colon cancer 07/26/2014  . Iron deficiency anemia 07/07/2014  . Vitamin D deficiency 07/07/2014  . Abnormal nuclear stress test 03/31/2014  . Abnormal cardiac CT angiography 03/31/2014  . Hyperlipidemia 03/24/2014  . Morbid obesity ([place])   . Chest pain of uncertain etiology 48/18/5631  . COPD (chronic obstructive  pulmonary disease) ([place]) 03/23/2014  . Leukocytosis 03/23/2014  . Hyperglycemia 03/23/2014  . Bipolar 1 disorder ([place]) 03/23/2014  . Pruritic rash 03/23/2014  . Chronic anxiety 03/23/2014  . Tobacco abuse 03/23/2014  . Intractable migraine without aura and with status migrainosus 01/14/2014  . Sinusitis 07/02/2013  . Urinary frequency 07/02/2013  . Migraine 07/02/2013  . Anxiety state, unspecified 04/14/2013  . Obesity 04/14/2013  . Anemia 04/14/2013  . CONSTIPATION 10/12/2009  . CARCINOMA IN SITU OF RECTUM 08/25/2009  . GERD 08/25/2009  . IRRITABLE BOWEL SYNDROME 08/25/2009  . DIARRHEA, CHRONIC 08/25/2009  . HEMANGIOMA,  HEPATIC 08/24/2009  . POLYCYSTIC OVARIAN DISEASE 08/24/2009  . Personal history of other diseases of digestive system 08/24/2009    Past Surgical History:  Procedure Laterality Date  . CARDIOVASCULAR STRESS TEST  03/24/14   Lexscan MIBI: mild anterior ischemia?  Cardiac CT angiogram recommended/done.  . CHOLECYSTECTOMY    . COLONOSCOPY  05/05/2007   adenoma  . coronary CT angio  03/29/14   Two vessel dz/moderate stenosis of mid LAD and proximal RCA; cath recommended.  Marland Kitchen DILATION AND CURETTAGE OF UTERUS     for vaginal bleeding  . JOINT REPLACEMENT    . LEFT HEART CATHETERIZATION WITH CORONARY ANGIOGRAM N/A 03/31/2014   Normal coronaries, EF 27-51%, diastolic dysfunction.  Procedure: LEFT HEART CATHETERIZATION WITH CORONARY ANGIOGRAM;  Surgeon: Sinclair Grooms, MD;  Location: [facility] CATH LAB;  Service: Cardiovascular;  Laterality: N/A;  . PFTs  04/2016   No sign of COPD or asthma; FEV 1 worse after albut.  . Situ removed  age 59   High-grade rectal adenoma removed from rectum   . TRANSTHORACIC ECHOCARDIOGRAM  03/23/14   Normal    OB History    Gravida Para Term Preterm AB Living   4 1 1   3 1    SAB TAB Ectopic Multiple Live Births   3               Home Medications    Prior to Admission medications   Medication Sig Start Date End Date Taking? Authorizing Provider  amoxicillin (AMOXIL) 875 MG tablet Take 1 tablet (875 mg total) by mouth 2 (two) times daily. 07/27/16   McGowen, Adrian Blackwater, MD  aspirin 81 MG chewable tablet Chew 1 tablet (81 mg total) by mouth daily. 09/06/12   Ripley Fraise, MD  buPROPion (WELLBUTRIN XL) 150 MG 24 hr tablet 1 tab po qd 07/27/16   McGowen, Adrian Blackwater, MD  busPIRone (BUSPAR) 15 MG tablet 1 in the morning and 2 at bedtime    [provider]  cetirizine (ZYRTEC) 10 MG tablet TAKE ONE TABLET BY MOUTH ONCE DAILY 01/11/16   McGowen, Adrian Blackwater, MD  clonazePAM (KLONOPIN) 0.5 MG tablet TAKE ONE TABLET BY MOUTH ONCE DAILY DURING THE DAY AS NEEDED, THEN TAKE  TWO TABLETS AT BEDTIME 05/28/16   McGowen, Adrian Blackwater, MD  cyclobenzaprine (FLEXERIL) 10 MG tablet TAKE ONE TABLET BY MOUTH EVERY 6 HOURS AS NEEDED (SPASM RELATED TO HEADACHES) 07/10/16   McGowen, Adrian Blackwater, MD  diphenhydrAMINE (BENADRYL) 25 MG tablet Take 1 tablet (25 mg total) by mouth every 6 (six) hours. Patient taking differently: Take 25-50 mg by mouth every 6 (six) hours as needed for itching or allergies.  07/19/15   Fredia Sorrow, MD  Docusate Calcium (STOOL SOFTENER PO) Take 3 tablets by mouth at bedtime.    [provider]  fluticasone (FLONASE) 50 MCG/ACT nasal spray Place 2 sprays into both nostrils  daily. 08/24/14   Ward, Delice Bison, DO  HYDROcodone-acetaminophen (NORCO) 10-325 MG tablet Take 1 tablet by mouth. Every 8-12 hours as needed    [provider]  hydrOXYzine (ATARAX/VISTARIL) 25 MG tablet Take 25 mg by mouth 3 (three) times daily.     [provider]  ibuprofen (ADVIL,MOTRIN) 800 MG tablet Take 1 tablet (800 mg total) by mouth 3 (three) times daily. 12/09/15   Noemi Chapel, MD  IRON-FOLIC ACID PO Take 65 mg by mouth daily.     [provider]  lamoTRIgine (LAMICTAL) 200 MG tablet Take 200 mg by mouth at bedtime.    [provider]  lidocaine (LIDODERM) 5 % Place 1 patch onto the skin daily. Remove & Discard patch within 12 hours or as directed by MD 08/21/16   Elzena Muston, PA-C  naloxegol oxalate (MOVANTIK) 25 MG TABS tablet Take by mouth daily.    [provider]  nitroGLYCERIN (NITROSTAT) 0.4 MG SL tablet Place 1 tablet (0.4 mg total) under the tongue every 5 (five) minutes as needed for chest pain. 05/04/16   McGowen, Adrian Blackwater, MD  pantoprazole (PROTONIX) 40 MG tablet TAKE ONE TABLET BY MOUTH ONCE DAILY 09/09/15   McGowen, Adrian Blackwater, MD  predniSONE (DELTASONE) 20 MG tablet 2 tabs po qd x 5d, then 1 tab po qd x 5d, then 1/2 tab po qd x 4d 07/27/16   McGowen, Adrian Blackwater, MD  pregabalin (LYRICA) 75 MG capsule Take 75 mg by mouth 3  (three) times daily.     [provider]  promethazine (PHENERGAN) 25 MG tablet Take 1 tablet (25 mg total) by mouth every 6 (six) hours as needed for nausea or vomiting. 08/06/16   McGowen, Adrian Blackwater, MD  rOPINIRole (REQUIP) 0.25 MG tablet TAKE THREE TABLETS BY MOUTH ONCE DAILY AT BEDTIME 08/02/16   McGowen, Adrian Blackwater, MD    Family History Family History  Problem Relation Age of Onset  . Colon cancer Father 27  . Cancer Father   . Diverticulitis Mother   . Heart disease Mother   . Hypertension Mother   . Hyperlipidemia Mother   . Mental illness Mother   . Diabetes Mother   . Heart attack Mother   . Stroke Neg Hx     Social History Social History  Substance Use Topics  . Smoking status: Current Every Day Smoker    Packs/day: 0.50    Years: 18.00    Types: Cigarettes  . Smokeless tobacco: Never Used     Comment: 25 years  . Alcohol use No     Allergies   Contrast media [iodinated diagnostic agents]; Shellfish allergy; Codeine; Ivermectin; Betadine [povidone iodine]; Latex; Lipitor [atorvastatin]; Permethrin; and Tape   Review of Systems Review of Systems  Constitutional: Negative for fever.  Respiratory: Negative for chest tightness and shortness of breath.   Cardiovascular: Negative for chest pain.  Gastrointestinal: Negative for abdominal pain, constipation, nausea and vomiting.  Genitourinary: Negative for decreased urine volume, difficulty urinating, dysuria, flank pain and hematuria.  Musculoskeletal: Positive for back pain and myalgias (right forearm pain). Negative for joint swelling and neck pain.  Skin: Negative for color change and rash.  Neurological: Negative for weakness, numbness and headaches.  All other systems reviewed and are negative.    Physical Exam Updated Vital Signs BP (!) 124/94 (BP Location: Left Arm)   Pulse (!) 109   Temp 98.6 F (37 C) (Oral)   Resp (!) 22   Ht 5' (1.524  m)   Wt 108.9 kg (240 lb)   LMP 08/01/2016   SpO2 99%    BMI 46.87 kg/m   Physical Exam  Constitutional: She is oriented to person, place, and time. She appears well-developed and well-nourished. No distress.  HENT:  Head: Normocephalic and atraumatic.  Neck: Normal range of motion. Neck supple.  Cardiovascular: Normal rate, regular rhythm, normal heart sounds and intact distal pulses.   No murmur heard. Pulmonary/Chest: Effort normal and breath sounds normal. No respiratory distress.  Abdominal: Soft. She exhibits no distension. There is no tenderness.  Musculoskeletal: She exhibits tenderness. She exhibits no edema.       Lumbar back: She exhibits tenderness and pain. She exhibits normal range of motion, no swelling, no deformity, no laceration and normal pulse.  ttp of the bilateral lumbar paraspinal muscles.  No spinal tenderness.  Pt has 5/5 strength against resistance of bilateral lower extremities. Tenderness of the muscular of the right forearm.  No erythema or edema. No proximal tenderness.  Compartments soft    Neurological: She is alert and oriented to person, place, and time. She has normal strength. No sensory deficit. She exhibits normal muscle tone. Coordination and gait normal.  Reflex Scores:      Patellar reflexes are 2+ on the right side and 2+ on the left side.      Achilles reflexes are 2+ on the right side and 2+ on the left side. Skin: Skin is warm and dry. Capillary refill takes less than 2 seconds. No rash noted. No erythema.  Psychiatric: She has a normal mood and affect.  Nursing note and vitals reviewed.    ED Treatments / Results  Labs (all labs ordered are listed, but only abnormal results are displayed) Labs Reviewed - No data to display  EKG  EKG Interpretation None       Radiology Dg Forearm Right  Result Date: 08/21/2016 CLINICAL DATA:  51 year old female with right arm pain. No known injury. EXAM: RIGHT FOREARM - 2 VIEW COMPARISON:  None. FINDINGS: There is no acute fracture or dislocation. The  bones are well mineralized. No arthritic changes. No joint effusion. The soft tissues appear unremarkable. IMPRESSION: Negative. Electronically Signed   By: Anner Crete M.D.   On: 08/21/2016 18:22    Procedures Procedures (including critical care time)  Medications Ordered in ED Medications  diazepam (VALIUM) tablet 10 mg (10 mg Oral Given 08/21/16 1740)  ketorolac (TORADOL) injection 60 mg (60 mg Intramuscular Given 08/21/16 1741)     Initial Impression / Assessment and Plan / ED Course  I have reviewed the triage vital signs and the nursing notes.  Pertinent labs & imaging results that were available during my care of the patient were reviewed by me and considered in my medical decision making (see chart for details).     Pt is followed by pain management.  Reviewed on the [place].  Received #90 hydrocodone 10/325 mg tabs on 08/01/16 for 30 days supply.    Focal pain to the muscluar of the right forearm.  NV intact.  No joint tenderness, no motor deficits, no concerning sx's for infection.  No proximal tenderness or other symptoms. Bilateral low back pain that's chronic, no focal neuro deficits or concerning sx's for emergent neuro process  Pt appears stable for d/c, ACE wrap applied to affected area and sling given.  Pt counseled on proper wear instructions.  Agrees to PCP or orthopedic f/u.    Final Clinical Impressions(s) / ED Diagnoses  Final diagnoses:  Right forearm pain    New Prescriptions New Prescriptions   LIDOCAINE (LIDODERM) 5 %    Place 1 patch onto the skin daily. Remove & Discard patch within 12 hours or as directed by MD     Kem Parkinson, PA-C 08/23/16 1213    Julianne Rice, MD 08/30/16 1402

## 2016-08-21 NOTE — ED Notes (Signed)
Respiratory at bedside.

## 2016-08-21 NOTE — ED Notes (Signed)
Patient transported to X-ray 

## 2016-08-21 NOTE — ED Triage Notes (Signed)
Pt states sees pain clinic for chronic back pain. states r arm from elbow to wrist started hurting x 1 week ago. March 5 had r shoulder surgery. Denies any arm injury. Nad.

## 2016-08-28 ENCOUNTER — Other Ambulatory Visit: Payer: Self-pay | Admitting: Family Medicine

## 2016-08-28 DIAGNOSIS — S46011D Strain of muscle(s) and tendon(s) of the rotator cuff of right shoulder, subsequent encounter: Secondary | ICD-10-CM | POA: Diagnosis not present

## 2016-08-28 NOTE — Telephone Encounter (Signed)
[facility] [place].  RF request for pantoprazole LOV: 07/27/16 Next ov: 09/07/16 Last written: 09/09/15 #90 w/ 3RF

## 2016-08-31 DIAGNOSIS — M25511 Pain in right shoulder: Secondary | ICD-10-CM | POA: Diagnosis not present

## 2016-08-31 DIAGNOSIS — Z79891 Long term (current) use of opiate analgesic: Secondary | ICD-10-CM | POA: Diagnosis not present

## 2016-08-31 DIAGNOSIS — M542 Cervicalgia: Secondary | ICD-10-CM | POA: Diagnosis not present

## 2016-09-07 ENCOUNTER — Ambulatory Visit: Payer: Commercial Managed Care - PPO | Admitting: Family Medicine

## 2016-09-14 ENCOUNTER — Ambulatory Visit (INDEPENDENT_AMBULATORY_CARE_PROVIDER_SITE_OTHER): Payer: Commercial Managed Care - PPO | Admitting: Family Medicine

## 2016-09-14 ENCOUNTER — Encounter: Payer: Self-pay | Admitting: Family Medicine

## 2016-09-14 VITALS — BP 126/86 | HR 102 | Temp 97.0°F | Resp 16 | Wt 251.0 lb

## 2016-09-14 DIAGNOSIS — F17200 Nicotine dependence, unspecified, uncomplicated: Secondary | ICD-10-CM

## 2016-09-14 DIAGNOSIS — Z720 Tobacco use: Secondary | ICD-10-CM | POA: Diagnosis not present

## 2016-09-14 DIAGNOSIS — Z716 Tobacco abuse counseling: Secondary | ICD-10-CM | POA: Diagnosis not present

## 2016-09-14 NOTE — Patient Instructions (Signed)
Use 21 mg nicotine patch, 1 daily x 30 days. Then fill rx for 14 mg patches and take 1 daily x 30 days. Then fill rx for 7 mg patches and take 1 daily x 30d.  You can use nicotine lozenge, 1 every 6 hours as needed for cravings.

## 2016-09-14 NOTE — Progress Notes (Signed)
OFFICE VISIT  09/14/2016   CC:  Chief Complaint  Patient presents with  . Follow-up    smoking cessation   HPI:    Patient is a 51 y.o. Caucasian female who presents for f/u tobacco dependence/cessation. Last visit 6-7 wks ago I started her on wellbutrin XL 150mg  qd for smoking cessation.  Pt states she took this 1 mo, was consistently having dizzy spells, some cognitive dysfunction so she stopped it. She says these sx's abated about 2-3 days after stopping med.  It did not help any with smoking cessation.  She is leary of trying the nicotine patch b/c of her tendency to have sensitive skin. She can't recall ever  Past Medical History:  Diagnosis Date  . Anxiety   . Arthritis   . Bipolar 1 disorder ([place])   . Bronchitis, mucopurulent recurrent ([place])    PFTs 04/2016 showed no sign of copd or asthma, plus she actually had worse FEV1 after albuterol.  . Chronic pain syndrome    chronic low back pain (Pain mgmt= Dr. [name])  . Colitis due to Clostridium difficile 2001  . GERD (gastroesophageal reflux disease)   . Hepatic hemangioma   . Hepatic steatosis 06/2016   Noted incidentally on CT chest  . History of Salmonella gastroenteritis   . Hyperlipidemia    a. Noted 02/2014.  Marland Kitchen Hypertension    HCTZ started 07/2015  . IBS (irritable bowel syndrome)   . Iron deficiency anemia 05/2014   per pt it is not due to vaginal blood loss; hemoccults sent to pt in mail 413/16.  . Lumbar spondylosis    mild, mainly focused at L4-5 and L5-S1.  . Microscopic hematuria    Intermittent (no w/u done yet, as of 03/2014)  . Migraine headache   . Morbid obesity ([place])   . Polycystic ovarian syndrome   . Pruritic dermatitis 2015/2016   +Scabies prep at [facility] 07/2014. (Primarily pruritic skin, but eventually a subtle rash as well)  . Rectal cancer ([place])    a. Followed by Dr. Gala Romney, dx 2000-2001. Tumor removed from rectal.  . Recurrent sinusitis    +allergic rhinitis  . Right  shoulder pain 2017   Murphy/Wainer: RC bursitis + RC tear (MRI)--injection trial is plan per pt report 05/04/16.  . Tobacco dependence   . Vitamin D deficiency     Past Surgical History:  Procedure Laterality Date  . CARDIOVASCULAR STRESS TEST  03/24/14   Lexscan MIBI: mild anterior ischemia?  Cardiac CT angiogram recommended/done.  . CHOLECYSTECTOMY    . COLONOSCOPY  05/05/2007   adenoma  . coronary CT angio  03/29/14   Two vessel dz/moderate stenosis of mid LAD and proximal RCA; cath recommended.  Marland Kitchen DILATION AND CURETTAGE OF UTERUS     for vaginal bleeding  . JOINT REPLACEMENT    . LEFT HEART CATHETERIZATION WITH CORONARY ANGIOGRAM N/A 03/31/2014   Normal coronaries, EF 65-68%, diastolic dysfunction.  Procedure: LEFT HEART CATHETERIZATION WITH CORONARY ANGIOGRAM;  Surgeon: Sinclair Grooms, MD;  Location: [facility] CATH LAB;  Service: Cardiovascular;  Laterality: N/A;  . PFTs  04/2016   No sign of COPD or asthma; FEV 1 worse after albut.  . Situ removed  age 61   High-grade rectal adenoma removed from rectum   . TRANSTHORACIC ECHOCARDIOGRAM  03/23/14   Normal    Outpatient Medications Prior to Visit  Medication Sig Dispense Refill  . aspirin 81 MG chewable tablet Chew 1 tablet (81 mg total) by  mouth daily. 14 tablet 0  . buPROPion (WELLBUTRIN XL) 150 MG 24 hr tablet 1 tab po qd 30 tablet 1  . busPIRone (BUSPAR) 15 MG tablet 1 in the morning and 2 at bedtime    . cetirizine (ZYRTEC) 10 MG tablet TAKE ONE TABLET BY MOUTH ONCE DAILY 30 tablet 11  . clonazePAM (KLONOPIN) 0.5 MG tablet TAKE ONE TABLET BY MOUTH ONCE DAILY DURING THE DAY AS NEEDED, THEN TAKE TWO TABLETS AT BEDTIME 90 tablet 5  . cyclobenzaprine (FLEXERIL) 10 MG tablet TAKE ONE TABLET BY MOUTH EVERY 6 HOURS AS NEEDED (SPASM RELATED TO HEADACHES) 42 tablet 5  . diphenhydrAMINE (BENADRYL) 25 MG tablet Take 1 tablet (25 mg total) by mouth every 6 (six) hours. (Patient taking differently: Take 25-50 mg by mouth every 6 (six) hours as  needed for itching or allergies. ) 20 tablet 0  . Docusate Calcium (STOOL SOFTENER PO) Take 3 tablets by mouth at bedtime.    . fluticasone (FLONASE) 50 MCG/ACT nasal spray Place 2 sprays into both nostrils daily. 16 g 1  . HYDROcodone-acetaminophen (NORCO) 10-325 MG tablet Take 1 tablet by mouth. Every 8-12 hours as needed    . hydrOXYzine (ATARAX/VISTARIL) 25 MG tablet Take 25 mg by mouth 3 (three) times daily.     Marland Kitchen ibuprofen (ADVIL,MOTRIN) 800 MG tablet Take 1 tablet (800 mg total) by mouth 3 (three) times daily. 21 tablet 0  . IRON-FOLIC ACID PO Take 65 mg by mouth daily.     Marland Kitchen lamoTRIgine (LAMICTAL) 200 MG tablet Take 200 mg by mouth at bedtime.    . lidocaine (LIDODERM) 5 % Place 1 patch onto the skin daily. Remove & Discard patch within 12 hours or as directed by MD 6 patch 0  . naloxegol oxalate (MOVANTIK) 25 MG TABS tablet Take by mouth daily.    . nitroGLYCERIN (NITROSTAT) 0.4 MG SL tablet Place 1 tablet (0.4 mg total) under the tongue every 5 (five) minutes as needed for chest pain. 20 tablet 0  . pantoprazole (PROTONIX) 40 MG tablet TAKE ONE TABLET BY MOUTH ONCE DAILY 90 tablet 3  . pregabalin (LYRICA) 75 MG capsule Take 75 mg by mouth 3 (three) times daily.     . promethazine (PHENERGAN) 25 MG tablet Take 1 tablet (25 mg total) by mouth every 6 (six) hours as needed for nausea or vomiting. 30 tablet 2  . rOPINIRole (REQUIP) 0.25 MG tablet TAKE THREE TABLETS BY MOUTH ONCE DAILY AT BEDTIME 90 tablet 6  . predniSONE (DELTASONE) 20 MG tablet 2 tabs po qd x 5d, then 1 tab po qd x 5d, then 1/2 tab po qd x 4d 17 tablet 0  . amoxicillin (AMOXIL) 875 MG tablet Take 1 tablet (875 mg total) by mouth 2 (two) times daily. (Patient not taking: Reported on 09/14/2016) 20 tablet 0   No facility-administered medications prior to visit.     Allergies  Allergen Reactions  . Contrast Media [Iodinated Diagnostic Agents] Anaphylaxis    Needs 13-hour prep  . Shellfish Allergy Anaphylaxis  . Codeine  Nausea And Vomiting and Other (See Comments)    Hallucinations, Also sees people that are not there   . Ivermectin Nausea And Vomiting    N/V and rash  . Betadine [Povidone Iodine] Rash  . Latex Hives  . Lipitor [Atorvastatin] Itching  . Permethrin Itching  . Tape Dermatitis    Paper tape and clear    ROS As per HPI  PE: Blood pressure 126/86, pulse Marland Kitchen)  102, temperature 97 F (36.1 C), temperature source Oral, resp. rate 16, weight 251 lb (113.9 kg), SpO2 94 %. Gen: Alert, well appearing.  Patient is oriented to person, place, time, and situation. AFFECT: pleasant, lucid thought and speech. ENT: Ears: EACs clear, normal epithelium.  TMs with good light reflex and landmarks bilaterally.  Eyes: no injection, icteris, swelling, or exudate.  EOMI, PERRLA. Nose: no drainage or turbinate edema/swelling.  No injection or focal lesion.  Mouth: lips without lesion/swelling.  Oral mucosa pink and moist.  Dentition intact and without obvious caries or gingival swelling.  Oropharynx without erythema, exudate, or swelling.  CV: RRR, no m/r/g. LUNGS: CTA on inspiration.  Good aeration on exhalation but trace end exp wheeze diffusely.  Nonlabored resps.    LABS:    Chemistry      Component Value Date/Time   NA 138 10/24/2015 1554   K 4.4 10/24/2015 1554   CL 106 10/24/2015 1554   CO2 23 10/24/2015 1554   BUN 14 10/24/2015 1554   CREATININE 0.80 10/24/2015 1554      Component Value Date/Time   CALCIUM 8.9 10/24/2015 1554   ALKPHOS 37 (L) 10/20/2015 0210   AST 12 (L) 10/20/2015 0210   ALT 10 (L) 10/20/2015 0210   BILITOT 0.3 10/20/2015 0210       IMPRESSION AND PLAN:  1) Tobacco dependence; smoking cessation. She failed trial of wellbutrin. Insurance won't cover chantix. Will try to rx nicotine patches and lozenges and see if insurance will cover. (21mg  x 30d, 14 mg x 30d, 7 mg x 30d then stop.  2mg  lozenges q6h prn craving, #120, RF x 1).  An After Visit Summary was printed  and given to the patient.  FOLLOW UP: Return in about 6 weeks (around 10/26/2016) for smoking cessation.  Signed:  Crissie Sickles, MD           09/14/2016

## 2016-09-20 DIAGNOSIS — M25511 Pain in right shoulder: Secondary | ICD-10-CM | POA: Diagnosis not present

## 2016-09-20 DIAGNOSIS — M545 Low back pain: Secondary | ICD-10-CM | POA: Diagnosis not present

## 2016-09-20 DIAGNOSIS — G43701 Chronic migraine without aura, not intractable, with status migrainosus: Secondary | ICD-10-CM | POA: Diagnosis not present

## 2016-09-21 ENCOUNTER — Other Ambulatory Visit (HOSPITAL_COMMUNITY): Payer: Self-pay | Admitting: Neurology

## 2016-09-21 DIAGNOSIS — M5416 Radiculopathy, lumbar region: Secondary | ICD-10-CM

## 2016-09-23 DIAGNOSIS — D179 Benign lipomatous neoplasm, unspecified: Secondary | ICD-10-CM

## 2016-09-23 HISTORY — DX: Benign lipomatous neoplasm, unspecified: D17.9

## 2016-09-28 ENCOUNTER — Ambulatory Visit (HOSPITAL_COMMUNITY)
Admission: RE | Admit: 2016-09-28 | Discharge: 2016-09-28 | Disposition: A | Discharge status: Home / Self Care | Payer: Commercial Managed Care - PPO | Source: Ambulatory Visit | Attending: Neurology | Admitting: Neurology

## 2016-09-28 DIAGNOSIS — M5416 Radiculopathy, lumbar region: Secondary | ICD-10-CM

## 2016-10-03 ENCOUNTER — Other Ambulatory Visit: Payer: Self-pay | Admitting: Neurology

## 2016-10-03 DIAGNOSIS — M5416 Radiculopathy, lumbar region: Secondary | ICD-10-CM

## 2016-10-08 ENCOUNTER — Encounter: Payer: Self-pay | Admitting: Family Medicine

## 2016-10-08 ENCOUNTER — Ambulatory Visit (INDEPENDENT_AMBULATORY_CARE_PROVIDER_SITE_OTHER): Payer: Commercial Managed Care - PPO | Admitting: Family Medicine

## 2016-10-08 VITALS — BP 134/90 | HR 98 | Temp 98.0°F | Resp 16 | Ht 62.0 in | Wt 252.8 lb

## 2016-10-08 DIAGNOSIS — M7989 Other specified soft tissue disorders: Secondary | ICD-10-CM | POA: Diagnosis not present

## 2016-10-08 DIAGNOSIS — M353 Polymyalgia rheumatica: Secondary | ICD-10-CM | POA: Diagnosis not present

## 2016-10-08 DIAGNOSIS — R5382 Chronic fatigue, unspecified: Secondary | ICD-10-CM

## 2016-10-08 DIAGNOSIS — F17200 Nicotine dependence, unspecified, uncomplicated: Secondary | ICD-10-CM

## 2016-10-08 DIAGNOSIS — G894 Chronic pain syndrome: Secondary | ICD-10-CM | POA: Diagnosis not present

## 2016-10-08 LAB — CBC WITH DIFFERENTIAL/PLATELET
Basophils Absolute: 0 10*3/uL (ref 0.0–0.1)
Basophils Relative: 0.5 % (ref 0.0–3.0)
Eosinophils Absolute: 0.1 10*3/uL (ref 0.0–0.7)
Eosinophils Relative: 1.5 % (ref 0.0–5.0)
HCT: 45.4 % (ref 36.0–46.0)
Hemoglobin: 15.4 g/dL — ABNORMAL HIGH (ref 12.0–15.0)
Lymphocytes Relative: 32.6 % (ref 12.0–46.0)
Lymphs Abs: 3.2 10*3/uL (ref 0.7–4.0)
MCHC: 33.9 g/dL (ref 30.0–36.0)
MCV: 89.4 fl (ref 78.0–100.0)
Monocytes Absolute: 0.4 10*3/uL (ref 0.1–1.0)
Monocytes Relative: 3.9 % (ref 3.0–12.0)
Neutro Abs: 6 10*3/uL (ref 1.4–7.7)
Neutrophils Relative %: 61.5 % (ref 43.0–77.0)
Platelets: 329 10*3/uL (ref 150.0–400.0)
RBC: 5.08 Mil/uL (ref 3.87–5.11)
RDW: 14.5 % (ref 11.5–15.5)
WBC: 9.8 10*3/uL (ref 4.0–10.5)

## 2016-10-08 LAB — COMPREHENSIVE METABOLIC PANEL
ALT: 14 U/L (ref 0–35)
AST: 9 U/L (ref 0–37)
Albumin: 4.4 g/dL (ref 3.5–5.2)
Alkaline Phosphatase: 58 U/L (ref 39–117)
BUN: 12 mg/dL (ref 6–23)
CO2: 27 mEq/L (ref 19–32)
Calcium: 10.1 mg/dL (ref 8.4–10.5)
Chloride: 104 mEq/L (ref 96–112)
Creatinine, Ser: 0.66 mg/dL (ref 0.40–1.20)
GFR: 101.26 mL/min (ref >60.00)
Glucose, Bld: 111 mg/dL — ABNORMAL HIGH (ref 70–99)
Potassium: 4.5 mEq/L (ref 3.5–5.1)
Sodium: 141 mEq/L (ref 135–145)
Total Bilirubin: 0.3 mg/dL (ref 0.2–1.2)
Total Protein: 6.7 g/dL (ref 6.0–8.3)

## 2016-10-08 LAB — SEDIMENTATION RATE: Sed Rate: 34 mm/hr — ABNORMAL HIGH (ref 0–20)

## 2016-10-08 LAB — CK: Total CK: 24 U/L (ref 7–177)

## 2016-10-08 LAB — TSH: TSH: 1.12 u[IU]/mL (ref 0.35–4.50)

## 2016-10-08 LAB — C-REACTIVE PROTEIN: CRP: 3.8 mg/dL (ref 0.5–20.0)

## 2016-10-08 LAB — T4, FREE: Free T4: 0.54 ng/dL — ABNORMAL LOW (ref 0.60–1.60)

## 2016-10-08 NOTE — Progress Notes (Signed)
OFFICE VISIT  10/08/2016   CC:  Chief Complaint  Patient presents with  . Swelling in left upper arm   HPI:    Patient is a 51 y.o. Caucasian female who presents accompanied by her boyfriend today for "swellings in left upper arm". Noted onset of L upper arm swelling about 5 d/a---3 distinct knots.  No pain.  One has enlarged since she detected these. Never had this before.    Onset of aching in L chest underneath breast since getting up this morning, constant, no radiation to arm or jaw.  No burning or tingling or rash in the area.  The lower chest area on both sides hurt when touched per pt. In fact, feels diffuse achiness/TTP essentially over entire body for "a while" lately. Has chronic nausea---nothing new this a.m.  No diaphoresis.  No palpitations.  Feels anxious.  +Coughing lately, dry.  No SOB.  Some wheezing "a lot", esp at night.  Takes albuterol and this sometimes makes her feel better. Has felt sense of disequilibrium on/off x 1-2 mo.  ROS: feels diffuse achiness all over entire body lately.  No fevers.  +HA's "horrible again".  NO neck stiffness. No rash.  Appetite is up due to lyrica per pt.  No vomiting or diarrhea.  No melena or hematochezia, no hematuria or dysuria.  X-ray of right forearm on 08/21/16 after she presented to the ED for forearm pain was NORMAL.  As per her usual, patient tends to dramatically magnify her symptoms.  Past Medical History:  Diagnosis Date  . Anxiety   . Arthritis   . Bipolar 1 disorder ([place])   . Bronchitis, mucopurulent recurrent ([place])    PFTs 04/2016 showed no sign of copd or asthma, plus she actually had worse FEV1 after albuterol.  . Chronic pain syndrome    chronic low back pain (Pain mgmt= Dr. [name])  . Colitis due to Clostridium difficile 2001  . GERD (gastroesophageal reflux disease)   . Hepatic hemangioma   . Hepatic steatosis 06/2016   Noted incidentally on CT chest  . History of Salmonella gastroenteritis   .  Hyperlipidemia    a. Noted 02/2014.  Marland Kitchen Hypertension    HCTZ started 07/2015  . IBS (irritable bowel syndrome)   . Iron deficiency anemia 05/2014   per pt it is not due to vaginal blood loss; hemoccults sent to pt in mail 413/16.  . Lumbar spondylosis    mild, mainly focused at L4-5 and L5-S1.  . Microscopic hematuria    Intermittent (no w/u done yet, as of 03/2014)  . Migraine headache   . Morbid obesity ([place])   . Polycystic ovarian syndrome   . Pruritic dermatitis 2015/2016   +Scabies prep at [facility] 07/2014. (Primarily pruritic skin, but eventually a subtle rash as well)  . Rectal cancer ([place])    a. Followed by Dr. Gala Romney, dx 2000-2001. Tumor removed from rectal.  . Recurrent sinusitis    +allergic rhinitis  . Right shoulder pain 2017   Murphy/Wainer: RC bursitis + RC tear (MRI)--injection trial is plan per pt report 05/04/16.  . Tobacco dependence   . Vitamin D deficiency     Past Surgical History:  Procedure Laterality Date  . CARDIOVASCULAR STRESS TEST  03/24/14   Lexscan MIBI: mild anterior ischemia?  Cardiac CT angiogram recommended/done.  . CHOLECYSTECTOMY    . COLONOSCOPY  05/05/2007   adenoma  . coronary CT angio  03/29/14   Two vessel dz/moderate stenosis of mid  LAD and proximal RCA; cath recommended.  Marland Kitchen DILATION AND CURETTAGE OF UTERUS     for vaginal bleeding  . JOINT REPLACEMENT    . LEFT HEART CATHETERIZATION WITH CORONARY ANGIOGRAM N/A 03/31/2014   Normal coronaries, EF 88-28%, diastolic dysfunction.  Procedure: LEFT HEART CATHETERIZATION WITH CORONARY ANGIOGRAM;  Surgeon: Sinclair Grooms, MD;  Location: [facility] CATH LAB;  Service: Cardiovascular;  Laterality: N/A;  . PFTs  04/2016   No sign of COPD or asthma; FEV 1 worse after albut.  . Situ removed  age 47   High-grade rectal adenoma removed from rectum   . TRANSTHORACIC ECHOCARDIOGRAM  03/23/14   Normal    Outpatient Medications Prior to Visit  Medication Sig Dispense Refill  . aspirin 81 MG chewable  tablet Chew 1 tablet (81 mg total) by mouth daily. 14 tablet 0  . busPIRone (BUSPAR) 15 MG tablet 1 in the morning and 2 at bedtime    . cetirizine (ZYRTEC) 10 MG tablet TAKE ONE TABLET BY MOUTH ONCE DAILY 30 tablet 11  . clonazePAM (KLONOPIN) 0.5 MG tablet TAKE ONE TABLET BY MOUTH ONCE DAILY DURING THE DAY AS NEEDED, THEN TAKE TWO TABLETS AT BEDTIME 90 tablet 5  . cyclobenzaprine (FLEXERIL) 10 MG tablet TAKE ONE TABLET BY MOUTH EVERY 6 HOURS AS NEEDED (SPASM RELATED TO HEADACHES) 42 tablet 5  . diphenhydrAMINE (BENADRYL) 25 MG tablet Take 1 tablet (25 mg total) by mouth every 6 (six) hours. (Patient taking differently: Take 25-50 mg by mouth every 6 (six) hours as needed for itching or allergies. ) 20 tablet 0  . Docusate Calcium (STOOL SOFTENER PO) Take 3 tablets by mouth at bedtime.    . fluticasone (FLONASE) 50 MCG/ACT nasal spray Place 2 sprays into both nostrils daily. 16 g 1  . HYDROcodone-acetaminophen (NORCO) 10-325 MG tablet Take 1 tablet by mouth. Every 8-12 hours as needed    . hydrOXYzine (ATARAX/VISTARIL) 25 MG tablet Take 25 mg by mouth 3 (three) times daily.     Marland Kitchen ibuprofen (ADVIL,MOTRIN) 800 MG tablet Take 1 tablet (800 mg total) by mouth 3 (three) times daily. 21 tablet 0  . IRON-FOLIC ACID PO Take 65 mg by mouth daily.     Marland Kitchen lamoTRIgine (LAMICTAL) 200 MG tablet Take 200 mg by mouth at bedtime.    . naloxegol oxalate (MOVANTIK) 25 MG TABS tablet Take by mouth daily.    . nitroGLYCERIN (NITROSTAT) 0.4 MG SL tablet Place 1 tablet (0.4 mg total) under the tongue every 5 (five) minutes as needed for chest pain. 20 tablet 0  . pantoprazole (PROTONIX) 40 MG tablet TAKE ONE TABLET BY MOUTH ONCE DAILY 90 tablet 3  . pregabalin (LYRICA) 75 MG capsule Take 75 mg by mouth 3 (three) times daily.     . promethazine (PHENERGAN) 25 MG tablet Take 1 tablet (25 mg total) by mouth every 6 (six) hours as needed for nausea or vomiting. 30 tablet 2  . rOPINIRole (REQUIP) 0.25 MG tablet TAKE THREE  TABLETS BY MOUTH ONCE DAILY AT BEDTIME 90 tablet 6  . buPROPion (WELLBUTRIN XL) 150 MG 24 hr tablet 1 tab po qd (Patient not taking: Reported on 10/08/2016) 30 tablet 1  . lidocaine (LIDODERM) 5 % Place 1 patch onto the skin daily. Remove & Discard patch within 12 hours or as directed by MD (Patient not taking: Reported on 10/08/2016) 6 patch 0   No facility-administered medications prior to visit.     Allergies  Allergen Reactions  .  Contrast Media [Iodinated Diagnostic Agents] Anaphylaxis    Needs 13-hour prep  . Shellfish Allergy Anaphylaxis  . Codeine Nausea And Vomiting and Other (See Comments)    Hallucinations, Also sees people that are not there   . Ivermectin Nausea And Vomiting    N/V and rash  . Betadine [Povidone Iodine] Rash  . Latex Hives  . Lipitor [Atorvastatin] Itching  . Permethrin Itching  . Tape Dermatitis    Paper tape and clear    ROS As per HPI  PE: Blood pressure 134/90, pulse 98, temperature 98 F (36.7 C), temperature source Oral, resp. rate 16, height '5\' 2"'  (1.575 m), weight 252 lb 12 oz (114.6 kg), last menstrual period 09/26/2016, SpO2 94 %.   Gen: alert, tired-appearing, NAD.  Obese-appearing. AFFECT: anxious, mildly depressed affect.  LUcid thought and speech. ENT: Ears: EACs clear, normal epithelium.  TMs with good light reflex and landmarks bilaterally.  Eyes: no injection, icteris, swelling, or exudate.  EOMI, PERRLA. Nose: no drainage or turbinate edema/swelling.  No injection or focal lesion.  Mouth: lips without lesion/swelling.  Oral mucosa pink and moist.  Dentition intact and without obvious caries or gingival swelling.  Oropharynx without erythema, exudate, or swelling.  Neck - No masses or thyromegaly or limitation in range of motion.  NO neck soft tissue or midline TTP. CV: RRR, no m/r/g.   LUNGS: CTA bilat, nonlabored resps, good aeration in all lung fields. ABD: soft, NT/ND EXT: no clubbing, cyanosis, or edema.  MUSC: no joint  swelling.  Diffuse TTP over entire back except over spine.  Anterolateral chest wall diffusely TTP bilat, also TTP under bottom-most rib bilat.  TTP over greater troch bilat.  No signif thigh or knee TTP. LL's very TTP all over.  No nodules or skin changes.   UPPER ARM on LEFT: diffuse fatty tissue--symmetric compared to R arm.  Some small, soft, moveable soft tissue nodules can be palpated in medial aspect of triceps region.  Non-tender. LABS:  Lab Results  Component Value Date   TSH 0.39 10/24/2015   Lab Results  Component Value Date   WBC 11.4 (H) 10/20/2015   HGB 11.3 (L) 10/20/2015   HCT 33.7 (L) 10/20/2015   MCV 83.8 10/20/2015   PLT 285 10/20/2015   Lab Results  Component Value Date   CREATININE 0.80 10/24/2015   BUN 14 10/24/2015   NA 138 10/24/2015   K 4.4 10/24/2015   CL 106 10/24/2015   CO2 23 10/24/2015   Lab Results  Component Value Date   ALT 10 (L) 10/20/2015   AST 12 (L) 10/20/2015   ALKPHOS 37 (L) 10/20/2015   BILITOT 0.3 10/20/2015   Lab Results  Component Value Date   CHOL 263 (H) 03/24/2014   Lab Results  Component Value Date   HDL 47 03/24/2014   Lab Results  Component Value Date   LDLCALC 169 (H) 03/24/2014   Lab Results  Component Value Date   TRIG 237 (H) 03/24/2014   Lab Results  Component Value Date   CHOLHDL 5.6 03/24/2014    IMPRESSION AND PLAN:  1) Left upper arm soft tissue nodules: reassured pt that these feel like lipomas--benign. Watchful waiting approach.  2) Symptom complex suspicious for fibromyalgia: diffuse body pain, fatigue, mental cloudiness. Will do lab eval to r/o sign of organic etiology: CBC, CMET, ESR, CRP, TSH, T3, T4, Rh factor, CCP ab, CK total, ANA. No imaging indicated at this time.  3) Chronic low back pain/hx of  diffuse body pain: seeing pain mgmt clinic (Dr. [name] in [place], [place]). However, pt voices concern about the provider--she says it is a female PA--"not being informed about her condition,  meds, etc" at most recent office visit or two, and pt is considering requesting a referral to new pain mgmt office.  We agreed that she needs to continue with current pain mgmt clinic for now and she should voice her concerns to her provider there and see if things change.  4) Tobacco dependence: ongoing.  She has failed wellbutrin, chantix, and nicotine replacement trial. Encouraged complete cessation again today.  Spent 45 min with pt today, with >50% of this time spent in counseling and care coordination regarding the above problems.  Specifically, discussed her pain issues, pain mgmt provider concerns and options for future pain mgmt choices.  Additionally, discussed w/u for her current symptomatology in detail.  Spent time reviewing smoking cessation attempts/failures/future plan.  Spent time listening to patient's questions and anwering them to the best of my ability.    An After Visit Summary was printed and given to the patient.  FOLLOW UP: Return in about 4 weeks (around 11/05/2016) for f/u myalgias.  Signed:  Crissie Sickles, MD           10/08/2016

## 2016-10-09 ENCOUNTER — Telehealth: Payer: Self-pay | Admitting: Family Medicine

## 2016-10-09 LAB — RHEUMATOID FACTOR: Rhuematoid fact SerPl-aCnc: <14 IU/mL (ref <14)

## 2016-10-09 LAB — T3: T3, Total: 113 ng/dL (ref 76–181)

## 2016-10-09 LAB — CYCLIC CITRUL PEPTIDE ANTIBODY, IGG: Cyclic Citrullin Peptide Ab: <16 Units

## 2016-10-09 NOTE — Telephone Encounter (Signed)
See result note from today's date.

## 2016-10-09 NOTE — Telephone Encounter (Signed)
Patient received Mychart lab results but no documentation was released.  Patient only had results from the [facility] labs, the [facility] labs don't seem to be released.  Please advise.

## 2016-10-10 ENCOUNTER — Other Ambulatory Visit: Payer: Self-pay | Admitting: Family Medicine

## 2016-10-10 DIAGNOSIS — M7989 Other specified soft tissue disorders: Secondary | ICD-10-CM

## 2016-10-10 LAB — ANTINUCLEAR ANTIBODIES, IFA: ANA Titer 1: NEGATIVE

## 2016-10-12 ENCOUNTER — Telehealth: Payer: Self-pay | Admitting: Family Medicine

## 2016-10-12 NOTE — Telephone Encounter (Signed)
OK to just monitor for now.  No need for o/v at this time. Cut back on sodium intake as much as possible.  Elevate legs like she has been doing. If she feels like she is worse over the weekend then she should go to ED for evaluation.

## 2016-10-12 NOTE — Telephone Encounter (Signed)
Patient states that she went to the store yesterday and she said her legs got so swollen that she could barely walk.  She is scared because the same symptoms in her arms is now in both her legs but worse.   Patient states they are some better this morning but she has been off her legs since this event and she took some ibuprofen.   Patient is questioning what you think she needs to do?  Please advise.

## 2016-10-12 NOTE — Telephone Encounter (Signed)
Patient advised.  Voiced understanding of instruction.

## 2016-10-15 ENCOUNTER — Ambulatory Visit (HOSPITAL_COMMUNITY)
Admission: RE | Admit: 2016-10-15 | Discharge: 2016-10-15 | Disposition: A | Discharge status: Home / Self Care | Payer: Commercial Managed Care - PPO | Source: Ambulatory Visit | Attending: Family Medicine | Admitting: Family Medicine

## 2016-10-15 ENCOUNTER — Encounter: Payer: Self-pay | Admitting: Family Medicine

## 2016-10-15 DIAGNOSIS — M7989 Other specified soft tissue disorders: Secondary | ICD-10-CM | POA: Diagnosis present

## 2016-10-19 ENCOUNTER — Ambulatory Visit: Payer: Commercial Managed Care - PPO | Admitting: Family Medicine

## 2016-10-19 DIAGNOSIS — G43701 Chronic migraine without aura, not intractable, with status migrainosus: Secondary | ICD-10-CM | POA: Diagnosis not present

## 2016-10-19 DIAGNOSIS — M25511 Pain in right shoulder: Secondary | ICD-10-CM | POA: Diagnosis not present

## 2016-10-19 DIAGNOSIS — M545 Low back pain: Secondary | ICD-10-CM | POA: Diagnosis not present

## 2016-10-23 ENCOUNTER — Ambulatory Visit
Admission: RE | Admit: 2016-10-23 | Discharge: 2016-10-23 | Disposition: A | Discharge status: Home / Self Care | Payer: Commercial Managed Care - PPO | Source: Ambulatory Visit | Attending: Neurology | Admitting: Neurology

## 2016-10-23 DIAGNOSIS — M5126 Other intervertebral disc displacement, lumbar region: Secondary | ICD-10-CM | POA: Diagnosis not present

## 2016-10-23 DIAGNOSIS — M5416 Radiculopathy, lumbar region: Secondary | ICD-10-CM

## 2016-11-05 ENCOUNTER — Ambulatory Visit: Payer: Commercial Managed Care - PPO | Admitting: Family Medicine

## 2016-11-07 DIAGNOSIS — M47816 Spondylosis without myelopathy or radiculopathy, lumbar region: Secondary | ICD-10-CM | POA: Diagnosis not present

## 2016-11-09 ENCOUNTER — Encounter: Payer: Self-pay | Admitting: Family Medicine

## 2016-11-19 ENCOUNTER — Encounter: Payer: Self-pay | Admitting: Family Medicine

## 2016-11-19 ENCOUNTER — Ambulatory Visit (INDEPENDENT_AMBULATORY_CARE_PROVIDER_SITE_OTHER): Payer: Commercial Managed Care - PPO | Admitting: Family Medicine

## 2016-11-19 VITALS — BP 143/89 | HR 106 | Temp 98.2°F | Resp 16 | Ht 62.0 in | Wt 257.5 lb

## 2016-11-19 DIAGNOSIS — J440 Chronic obstructive pulmonary disease with acute lower respiratory infection: Secondary | ICD-10-CM | POA: Diagnosis not present

## 2016-11-19 DIAGNOSIS — J0121 Acute recurrent ethmoidal sinusitis: Secondary | ICD-10-CM | POA: Diagnosis not present

## 2016-11-19 DIAGNOSIS — J209 Acute bronchitis, unspecified: Secondary | ICD-10-CM | POA: Diagnosis not present

## 2016-11-19 DIAGNOSIS — Z720 Tobacco use: Secondary | ICD-10-CM

## 2016-11-19 MED ORDER — PREDNISONE 20 MG PO TABS: ORAL_TABLET | ORAL | 0 refills | Status: DC

## 2016-11-19 MED ORDER — AMOXICILLIN 875 MG PO TABS: 875.0000 mg | ORAL_TABLET | Freq: Two times a day (BID) | ORAL | 0 refills | Status: AC

## 2016-11-19 NOTE — Patient Instructions (Signed)
Get otc generic robitussin DM OR Mucinex DM and use as directed on the packaging for cough and congestion. Use otc generic saline nasal spray 2-3 times per day to irrigate/moisturize your nasal passages.   

## 2016-11-19 NOTE — Progress Notes (Signed)
OFFICE VISIT  11/19/2016   CC:  Chief Complaint  Martha Espinoza presents with  . Sinus Infection    HPI:    Martha Espinoza is a 51 y.o. Caucasian female who presents for respiratory complaints.  She had been feeling dry nose with sneezing and itchy eyes/ears/nose + more wheezing for about 10d. Then, onset 4-5 d/a with dry cough--some fits that made her feel like she couldn't catch her breath for a minute. Lots of nasal mucous then started, PND.  Mild PND ST.  Voice a little hoarse.  Pain in both maxillary sinus areas.   Some wheezing but no signif SOB.  Has used albut 2-4 times last few days and this helps some.  TEmp 101 since this started 4/d/a. Tob: down to less than 1/2 pack.   Most recent albut HFA was yesterday. +Malaise and fatigue.  No n/v/d.  No rash.   Past Medical History:  Diagnosis Date  . Anxiety   . Arthritis   . Bipolar 1 disorder ([place])   . Bronchitis, mucopurulent recurrent ([place])    PFTs 04/2016 showed no sign of copd or asthma, plus she actually had worse FEV1 after albuterol.  . Chronic pain syndrome    chronic low back pain (Pain mgmt= Dr. [name]).  MRI showed no signif disc dz, only showed some facet arthropathy at L4-5, with joint diastasis at L4-5--plan as of 11/07/16 neurosurg eval is bilat facet injections.  . Colitis due to Clostridium difficile 2001  . GERD (gastroesophageal reflux disease)   . Hepatic hemangioma   . Hepatic steatosis 06/2016   Noted incidentally on CT chest  . History of Salmonella gastroenteritis   . Hyperlipidemia    a. Noted 02/2014.  Marland Kitchen Hypertension    HCTZ started 07/2015  . IBS (irritable bowel syndrome)   . Iron deficiency anemia 05/2014   per pt it is not due to vaginal blood loss; hemoccults sent to pt in mail 413/16.  . Lumbar spondylosis    mild, mainly focused at L4-5 and L5-S1.  . Microscopic hematuria    Intermittent (no w/u done yet, as of 03/2014)  . Migraine headache   . Morbid obesity ([place])   . Multiple lipomas 09/2016   left upper arm--very small ones.  . Polycystic ovarian syndrome   . Pruritic dermatitis 2015/2016   +Scabies prep at [facility] 07/2014. (Primarily pruritic skin, but eventually a subtle rash as well)  . Rectal cancer ([place])    a. Followed by Dr. Gala Romney, dx 2000-2001. Tumor removed from rectal.  . Recurrent sinusitis    +allergic rhinitis  . Right shoulder pain 2017   Murphy/Wainer: RC bursitis + RC tear (MRI)--injection trial is plan per pt report 05/04/16.  . Tobacco dependence   . Vitamin D deficiency     Past Surgical History:  Procedure Laterality Date  . CARDIOVASCULAR STRESS TEST  03/24/14   Lexscan MIBI: mild anterior ischemia?  Cardiac CT angiogram recommended/done.  . CHOLECYSTECTOMY    . COLONOSCOPY  05/05/2007   adenoma  . coronary CT angio  03/29/14   Two vessel dz/moderate stenosis of mid LAD and proximal RCA; cath recommended.  Marland Kitchen DILATION AND CURETTAGE OF UTERUS     for vaginal bleeding  . JOINT REPLACEMENT    . LEFT HEART CATHETERIZATION WITH CORONARY ANGIOGRAM N/A 03/31/2014   Normal coronaries, EF 60-73%, diastolic dysfunction.  Procedure: LEFT HEART CATHETERIZATION WITH CORONARY ANGIOGRAM;  Surgeon: Sinclair Grooms, MD;  Location: [facility] CATH LAB;  Service: Cardiovascular;  Laterality: N/A;  . PFTs  04/2016   No sign of COPD or asthma; FEV 1 worse after albut.  . Situ removed  age 79   High-grade rectal adenoma removed from rectum   . TRANSTHORACIC ECHOCARDIOGRAM  03/23/14   Normal    Outpatient Medications Prior to Visit  Medication Sig Dispense Refill  . aspirin 81 MG chewable tablet Chew 1 tablet (81 mg total) by mouth daily. 14 tablet 0  . busPIRone (BUSPAR) 15 MG tablet 1 in the morning and 2 at bedtime    . cetirizine (ZYRTEC) 10 MG tablet TAKE ONE TABLET BY MOUTH ONCE DAILY 30 tablet 11  . clonazePAM (KLONOPIN) 0.5 MG tablet TAKE ONE TABLET BY MOUTH ONCE DAILY DURING THE DAY AS NEEDED, THEN TAKE TWO TABLETS AT BEDTIME 90 tablet 5  . cyclobenzaprine  (FLEXERIL) 10 MG tablet TAKE ONE TABLET BY MOUTH EVERY 6 HOURS AS NEEDED (SPASM RELATED TO HEADACHES) 42 tablet 5  . diphenhydrAMINE (BENADRYL) 25 MG tablet Take 1 tablet (25 mg total) by mouth every 6 (six) hours. (Martha Espinoza taking differently: Take 25-50 mg by mouth every 6 (six) hours as needed for itching or allergies. ) 20 tablet 0  . fluticasone (FLONASE) 50 MCG/ACT nasal spray Place 2 sprays into both nostrils daily. 16 g 1  . HYDROcodone-acetaminophen (NORCO) 10-325 MG tablet Take 1 tablet by mouth. Every 8-12 hours as needed    . hydrOXYzine (ATARAX/VISTARIL) 25 MG tablet Take 25 mg by mouth 3 (three) times daily.     Marland Kitchen ibuprofen (ADVIL,MOTRIN) 800 MG tablet Take 1 tablet (800 mg total) by mouth 3 (three) times daily. 21 tablet 0  . IRON-FOLIC ACID PO Take 65 mg by mouth daily.     Marland Kitchen lamoTRIgine (LAMICTAL) 200 MG tablet Take 200 mg by mouth at bedtime.    . naloxegol oxalate (MOVANTIK) 25 MG TABS tablet Take by mouth daily.    . nitroGLYCERIN (NITROSTAT) 0.4 MG SL tablet Place 1 tablet (0.4 mg total) under the tongue every 5 (five) minutes as needed for chest pain. 20 tablet 0  . pantoprazole (PROTONIX) 40 MG tablet TAKE ONE TABLET BY MOUTH ONCE DAILY 90 tablet 3  . pregabalin (LYRICA) 75 MG capsule Take 75 mg by mouth 3 (three) times daily.     . promethazine (PHENERGAN) 25 MG tablet Take 1 tablet (25 mg total) by mouth every 6 (six) hours as needed for nausea or vomiting. 30 tablet 2  . rOPINIRole (REQUIP) 0.25 MG tablet TAKE THREE TABLETS BY MOUTH ONCE DAILY AT BEDTIME 90 tablet 6  . Docusate Calcium (STOOL SOFTENER PO) Take 3 tablets by mouth at bedtime.     No facility-administered medications prior to visit.     Allergies  Allergen Reactions  . Contrast Media [Iodinated Diagnostic Agents] Anaphylaxis    Needs 13-hour prep  . Shellfish Allergy Anaphylaxis  . Codeine Nausea And Vomiting and Other (See Comments)    Hallucinations, Also sees people that are not there   .  Ivermectin Nausea And Vomiting    N/V and rash  . Betadine [Povidone Iodine] Rash  . Latex Hives  . Lipitor [Atorvastatin] Itching  . Permethrin Itching  . Tape Dermatitis    Paper tape and clear    ROS As per HPI  PE: Blood pressure (!) 143/89, pulse (!) 106, temperature 98.2 F (36.8 C), temperature source Oral, resp. rate 16, height 5\' 2"  (1.575 m), weight 257 lb 8 oz (116.8 kg), last menstrual period 10/26/2016, SpO2  93 %. VS: noted--normal. Gen: alert, NAD, NONTOXIC APPEARING. HEENT: eyes without injection, drainage, or swelling.  Ears: EACs clear, TMs with normal light reflex and landmarks.  Nose: Scant amount of clear rhinorrhea, with some dried, crusty exudate adherent to mildly injected and edematous mucosa.  No purulent d/c.  Mild/mod diffuse paranasal sinus TTP.  No facial swelling.  Throat and mouth without focal lesion.  No pharyngial swelling, erythema, or exudate.   Neck: supple, no LAD.   LUNGS: CTA bilat but aeration slightly diminished in all lung fields---question decreased pt effort on insp/exp, nonlabored resps.  Question of decreased BS in R base but not consistently heard.  With forced exhalation she had a course wheeze diffusely and coughed a lot, but aeration was fine. CV: Regular, mild tachycardia, no m/r/g. EXT: no c/c/e SKIN: no rash   LABS:    Chemistry      Component Value Date/Time   NA 141 10/08/2016 1142   K 4.5 10/08/2016 1142   CL 104 10/08/2016 1142   CO2 27 10/08/2016 1142   BUN 12 10/08/2016 1142   CREATININE 0.66 10/08/2016 1142      Component Value Date/Time   CALCIUM 10.1 10/08/2016 1142   ALKPHOS 58 10/08/2016 1142   AST 9 10/08/2016 1142   ALT 14 10/08/2016 1142   BILITOT 0.3 10/08/2016 1142      IMPRESSION AND PLAN:  Acute sinusitis, with acute on chronic bronchitis--possible bacterial bronchopneumonia. Pt appears nontoxic, w/out resp distress. Prednisone 40mg  qd x 5d, then 20mg  qd x 5d, then 10mg  qd x 6d. Amoxil 875 mg, 1  tab po bid x 10d (the only antibiotic that she "trusts"). STOP SMOKING!. Get otc generic robitussin DM OR Mucinex DM and use as directed on the packaging for cough and congestion. Use otc generic saline nasal spray 2-3 times per day to irrigate/moisturize your nasal passages.  An After Visit Summary was printed and given to the Martha Espinoza.  FOLLOW UP: Return if symptoms worsen or fail to improve.  Signed:  Crissie Sickles, MD           11/19/2016

## 2016-11-21 ENCOUNTER — Other Ambulatory Visit (HOSPITAL_COMMUNITY): Payer: Self-pay | Admitting: Neurology

## 2016-11-21 ENCOUNTER — Ambulatory Visit (HOSPITAL_COMMUNITY)
Admission: RE | Admit: 2016-11-21 | Discharge: 2016-11-21 | Disposition: A | Discharge status: Home / Self Care | Payer: Commercial Managed Care - PPO | Source: Ambulatory Visit | Attending: Neurology | Admitting: Neurology

## 2016-11-21 DIAGNOSIS — M542 Cervicalgia: Secondary | ICD-10-CM

## 2016-11-21 DIAGNOSIS — I700 Atherosclerosis of aorta: Secondary | ICD-10-CM | POA: Insufficient documentation

## 2016-11-21 DIAGNOSIS — I70208 Unspecified atherosclerosis of native arteries of extremities, other extremity: Secondary | ICD-10-CM | POA: Diagnosis not present

## 2016-11-21 DIAGNOSIS — M533 Sacrococcygeal disorders, not elsewhere classified: Secondary | ICD-10-CM | POA: Diagnosis not present

## 2016-11-21 DIAGNOSIS — M545 Low back pain: Secondary | ICD-10-CM

## 2016-11-21 DIAGNOSIS — G43701 Chronic migraine without aura, not intractable, with status migrainosus: Secondary | ICD-10-CM | POA: Diagnosis not present

## 2016-11-21 DIAGNOSIS — M25561 Pain in right knee: Secondary | ICD-10-CM

## 2016-11-21 DIAGNOSIS — M5030 Other cervical disc degeneration, unspecified cervical region: Secondary | ICD-10-CM | POA: Diagnosis not present

## 2016-11-21 DIAGNOSIS — M5440 Lumbago with sciatica, unspecified side: Secondary | ICD-10-CM | POA: Diagnosis not present

## 2016-11-21 DIAGNOSIS — M25511 Pain in right shoulder: Secondary | ICD-10-CM | POA: Diagnosis not present

## 2016-12-06 DIAGNOSIS — M47816 Spondylosis without myelopathy or radiculopathy, lumbar region: Secondary | ICD-10-CM | POA: Diagnosis not present

## 2016-12-19 ENCOUNTER — Other Ambulatory Visit: Payer: Self-pay | Admitting: Family Medicine

## 2016-12-19 ENCOUNTER — Telehealth: Payer: Self-pay

## 2016-12-19 NOTE — Telephone Encounter (Signed)
Martha Espinoza from [place], [place] is requesting a reason why patient needs Clonazepam while on Opiods for their records.

## 2016-12-19 NOTE — Telephone Encounter (Signed)
Pharmacy notified and documented reason.

## 2016-12-19 NOTE — Telephone Encounter (Signed)
[place]  RF request for clonazepam LOV: 11/19/16 Next ov: None Last written: 05/28/16 #90 w/ 5RF  Please advise. Thanks.

## 2016-12-19 NOTE — Telephone Encounter (Signed)
Rx faxed

## 2016-12-19 NOTE — Telephone Encounter (Signed)
Treatment of generalized anxiety disorder and panic attacks.

## 2016-12-21 DIAGNOSIS — M545 Low back pain: Secondary | ICD-10-CM | POA: Diagnosis not present

## 2016-12-21 DIAGNOSIS — G43719 Chronic migraine without aura, intractable, without status migrainosus: Secondary | ICD-10-CM | POA: Diagnosis not present

## 2016-12-21 DIAGNOSIS — M5416 Radiculopathy, lumbar region: Secondary | ICD-10-CM | POA: Diagnosis not present

## 2017-01-08 ENCOUNTER — Telehealth: Payer: Self-pay | Admitting: Family Medicine

## 2017-01-08 NOTE — Telephone Encounter (Signed)
Patient called stating the her pain management ordered MRI and saw something on liver and kidneys and she wants to know if you got the results? Patient concerned.    Also, they suggested that she should be put on a cholesterol medication because they saw a blockage somewhere ( patient unsure ).  Please advise.

## 2017-01-09 NOTE — Telephone Encounter (Signed)
We have not been able to check her cholesterol since 2015. There is no urgency in getting on a cholesterol medication. Tell her to let me evaluate her MRI results and then we can move on from there.  Even if MRI shows some signs that she needs a cholesterol med, I will need to get a FASTING cholesterol panel on her before starting any med.  Reassure her--THIS IS NOT AN URGENT SITUATION.-thx

## 2017-01-09 NOTE — Telephone Encounter (Signed)
Patient also requesting cholesterol medication, please advise.

## 2017-01-09 NOTE — Telephone Encounter (Signed)
Dr. Anitra Lauth have you received pts MRI results?

## 2017-01-09 NOTE — Telephone Encounter (Signed)
Pls request copy of MRI report that she is referring to. We'll get back to her after I've reviewed it.  Tell her not to worry, b/c MRI's often pick up little things that are not clinically significant---but I'll look it over to make sure.-thx

## 2017-01-10 NOTE — Telephone Encounter (Signed)
Patient advised.  She is going to get records of MRI reports and bring to office.

## 2017-01-10 NOTE — Telephone Encounter (Signed)
Noted  

## 2017-01-14 DIAGNOSIS — G43701 Chronic migraine without aura, not intractable, with status migrainosus: Secondary | ICD-10-CM | POA: Diagnosis not present

## 2017-01-14 DIAGNOSIS — M25811 Other specified joint disorders, right shoulder: Secondary | ICD-10-CM | POA: Diagnosis not present

## 2017-01-14 DIAGNOSIS — M545 Low back pain: Secondary | ICD-10-CM | POA: Diagnosis not present

## 2017-01-17 ENCOUNTER — Encounter: Payer: Self-pay | Admitting: Family Medicine

## 2017-01-17 ENCOUNTER — Other Ambulatory Visit (HOSPITAL_COMMUNITY)
Admission: RE | Admit: 2017-01-17 | Discharge: 2017-01-17 | Disposition: A | Discharge status: Home / Self Care | Payer: Commercial Managed Care - PPO | Source: Ambulatory Visit | Attending: Family Medicine | Admitting: Family Medicine

## 2017-01-17 ENCOUNTER — Ambulatory Visit (INDEPENDENT_AMBULATORY_CARE_PROVIDER_SITE_OTHER): Payer: Commercial Managed Care - PPO | Admitting: Family Medicine

## 2017-01-17 VITALS — BP 148/94 | HR 86 | Temp 98.1°F | Resp 16 | Ht 62.0 in | Wt 262.8 lb

## 2017-01-17 DIAGNOSIS — N939 Abnormal uterine and vaginal bleeding, unspecified: Secondary | ICD-10-CM | POA: Insufficient documentation

## 2017-01-17 DIAGNOSIS — R3989 Other symptoms and signs involving the genitourinary system: Secondary | ICD-10-CM

## 2017-01-17 MED ORDER — MEDROXYPROGESTERONE ACETATE 10 MG PO TABS: 10.0000 mg | ORAL_TABLET | Freq: Every day | ORAL | 0 refills | Status: DC

## 2017-01-17 NOTE — Progress Notes (Signed)
OFFICE VISIT  01/17/2017   CC:  Chief Complaint  Patient presents with  . Urine Frequency  . Bladder pressure  . Vaginal Bleeding   HPI:    Patient is a 51 y.o. Caucasian female who presents accompanied by her mother for vag bleeding. LMP 12/23/16.  Has consistent 30d cycles, bleeds x 4d.  However, started spotting vaginally on 01/09/17 when she wiped.  This went on 2-3d.  Then she noted the bleeding increase--had to use a pad.  Then it "let loose" and started soaking pads--several per day.  At onset of this about 2 weeks ago, she got urinary urgency, incomplete emptying, urinary frequency.  Lots of suprapubic pressure.  LBP L>R.  No fever or flank pain.  Some nausea but no vomiting.  No dysuria.  All these sx's are still occurring.   No AZO taken.  BM's fairly regular, formed.  NO abd cramping with any of this. She is not sexually active.  No new sex partner lately.  Has hx of STD--she thinks it was chlamydia or trichomonas.  Has remote hx of DUB about 10+ yrs ago, got d&c and insurance turned down a hysterectomy. Things normalized after this. She is a longtime, current heavy smoker.  GYN MD:  Dr. Bary Richard-- last saw him a few months ago, pap was done and was normal. New meds lately: cymbalta, symproic, percocet.  No herbal meds.  Has high stress level all the time, but her mother says there is family strife going on right now with her son, daughter in law, and granchchild.  BP has been staying in 140-150s/90s.  ROS: no easy bruising, no nosebleeds, no melena/hematochezia, no gross hematuria. +Chronic anxiety, chronic low back pain.  No SOB or wheezing or CP.  Past Medical History:  Diagnosis Date  . Anxiety   . Arthritis   . Bipolar 1 disorder ([place])   . Bronchitis, mucopurulent recurrent ([place])    PFTs 04/2016 showed no sign of copd or asthma, plus she actually had worse FEV1 after albuterol.  . Chronic pain syndrome    chronic low back pain (Pain mgmt= Dr. [name]).  MRI showed no  signif disc dz, only showed some facet arthropathy at L4-5, with joint diastasis at L4-5--plan as of 11/07/16 neurosurg eval is bilat facet injections.  . Colitis due to Clostridium difficile 2001  . GERD (gastroesophageal reflux disease)   . Hepatic hemangioma   . Hepatic steatosis 06/2016   Noted incidentally on CT chest  . History of Salmonella gastroenteritis   . Hyperlipidemia    a. Noted 02/2014.  Marland Kitchen Hypertension    HCTZ started 07/2015  . IBS (irritable bowel syndrome)   . Iron deficiency anemia 05/2014   per pt it is not due to vaginal blood loss; hemoccults sent to pt in mail 413/16.  . Lumbar spondylosis    mild, mainly focused at L4-5 and L5-S1.  . Microscopic hematuria    Intermittent (no w/u done yet, as of 03/2014)  . Migraine headache   . Morbid obesity ([place])   . Multiple lipomas 09/2016   left upper arm--very small ones.  . Polycystic ovarian syndrome   . Pruritic dermatitis 2015/2016   +Scabies prep at [facility] 07/2014. (Primarily pruritic skin, but eventually a subtle rash as well)  . Rectal cancer ([place])    a. Followed by Dr. Gala Romney, dx 2000-2001. Tumor removed from rectal.  . Recurrent sinusitis    +allergic rhinitis  . Right shoulder pain 2017  Murphy/Wainer: RC bursitis + RC tear (MRI)--injection trial is plan per pt report 05/04/16.  . Tobacco dependence   . Vitamin D deficiency     Past Surgical History:  Procedure Laterality Date  . CARDIOVASCULAR STRESS TEST  03/24/14   Lexscan MIBI: mild anterior ischemia?  Cardiac CT angiogram recommended/done.  . CHOLECYSTECTOMY    . COLONOSCOPY  05/05/2007   adenoma  . coronary CT angio  03/29/14   Two vessel dz/moderate stenosis of mid LAD and proximal RCA; cath recommended.  Marland Kitchen DILATION AND CURETTAGE OF UTERUS     for vaginal bleeding  . JOINT REPLACEMENT    . LEFT HEART CATHETERIZATION WITH CORONARY ANGIOGRAM N/A 03/31/2014   Normal coronaries, EF 66-44%, diastolic dysfunction.  Procedure: LEFT HEART  CATHETERIZATION WITH CORONARY ANGIOGRAM;  Surgeon: Sinclair Grooms, MD;  Location: [facility] CATH LAB;  Service: Cardiovascular;  Laterality: N/A;  . PFTs  04/2016   No sign of COPD or asthma; FEV 1 worse after albut.  . Situ removed  age 58   High-grade rectal adenoma removed from rectum   . TRANSTHORACIC ECHOCARDIOGRAM  03/23/14   Normal    Outpatient Medications Prior to Visit  Medication Sig Dispense Refill  . aspirin 81 MG chewable tablet Chew 1 tablet (81 mg total) by mouth daily. 14 tablet 0  . busPIRone (BUSPAR) 15 MG tablet 1 in the morning and 2 at bedtime    . cetirizine (ZYRTEC) 10 MG tablet TAKE ONE TABLET BY MOUTH ONCE DAILY 30 tablet 11  . clonazePAM (KLONOPIN) 0.5 MG tablet TAKE 1 TABLET BY MOUTH ONCE DAILY DURING THE DAY AS NEEDED, THEN TAKE 2 TABLETS AT BEDTIME. 90 tablet 5  . cyclobenzaprine (FLEXERIL) 10 MG tablet TAKE ONE TABLET BY MOUTH EVERY 6 HOURS AS NEEDED (SPASM RELATED TO HEADACHES) 42 tablet 5  . diphenhydrAMINE (BENADRYL) 25 MG tablet Take 1 tablet (25 mg total) by mouth every 6 (six) hours. (Patient taking differently: Take 25-50 mg by mouth every 6 (six) hours as needed for itching or allergies. ) 20 tablet 0  . fluticasone (FLONASE) 50 MCG/ACT nasal spray Place 2 sprays into both nostrils daily. 16 g 1  . hydrOXYzine (ATARAX/VISTARIL) 25 MG tablet Take 25 mg by mouth 3 (three) times daily.     Marland Kitchen ibuprofen (ADVIL,MOTRIN) 800 MG tablet Take 1 tablet (800 mg total) by mouth 3 (three) times daily. 21 tablet 0  . IRON-FOLIC ACID PO Take 65 mg by mouth daily.     Marland Kitchen lamoTRIgine (LAMICTAL) 200 MG tablet Take 200 mg by mouth at bedtime.    . nitroGLYCERIN (NITROSTAT) 0.4 MG SL tablet Place 1 tablet (0.4 mg total) under the tongue every 5 (five) minutes as needed for chest pain. 20 tablet 0  . pantoprazole (PROTONIX) 40 MG tablet TAKE ONE TABLET BY MOUTH ONCE DAILY 90 tablet 3  . pregabalin (LYRICA) 75 MG capsule Take 75 mg by mouth 3 (three) times daily.     . promethazine  (PHENERGAN) 25 MG tablet Take 1 tablet (25 mg total) by mouth every 6 (six) hours as needed for nausea or vomiting. 30 tablet 2  . rOPINIRole (REQUIP) 0.25 MG tablet TAKE THREE TABLETS BY MOUTH ONCE DAILY AT BEDTIME 90 tablet 6  . HYDROcodone-acetaminophen (NORCO) 10-325 MG tablet Take 1 tablet by mouth. Every 8-12 hours as needed    . naloxegol oxalate (MOVANTIK) 25 MG TABS tablet Take by mouth daily.    . predniSONE (DELTASONE) 20 MG tablet 2 tabs  po qd x 5d, then 1 tab po qd x 5d, then 1/2 tab po qd x 6d (Patient not taking: Reported on 01/17/2017) 18 tablet 0   No facility-administered medications prior to visit.     Allergies  Allergen Reactions  . Contrast Media [Iodinated Diagnostic Agents] Anaphylaxis    Needs 13-hour prep  . Shellfish Allergy Anaphylaxis  . Codeine Nausea And Vomiting and Other (See Comments)    Hallucinations, Also sees people that are not there   . Ivermectin Nausea And Vomiting    N/V and rash  . Betadine [Povidone Iodine] Rash  . Latex Hives  . Lipitor [Atorvastatin] Itching  . Permethrin Itching  . Tape Dermatitis    Paper tape and clear    ROS As per HPI  PE: Blood pressure (!) 148/94, pulse 86, temperature 98.1 F (36.7 C), temperature source Oral, resp. rate 16, height 5\' 2"  (1.575 m), weight 262 lb 12 oz (119.2 kg), last menstrual period 12/23/2016, SpO2 95 %.  Exam chaperoned by Etheleen Sia, LPN Gen: Alert, well appearing.  Patient is oriented to person, place, time, and situation. AFFECT: pleasant, lucid thought and speech. ABD: soft, mild diffuse TTP in lower abdomen, without rebound or guarding.   Pelvic exam: gross blood over vulva, vaginal mucosa w/out lesion, blood slightly pooled in vaginal vault. Due to collapsing of vaginal walls on speculum exam, I could not be sure I saw the cervical os.  Cervix appeared non-inflamed.  GC/Chl swab obtained.  LABS:  Lab Results  Component Value Date   WBC 9.8 10/08/2016   HGB 15.4 (H)  10/08/2016   HCT 45.4 10/08/2016   MCV 89.4 10/08/2016   PLT 329.0 10/08/2016     Chemistry      Component Value Date/Time   NA 141 10/08/2016 1142   K 4.5 10/08/2016 1142   CL 104 10/08/2016 1142   CO2 27 10/08/2016 1142   BUN 12 10/08/2016 1142   CREATININE 0.66 10/08/2016 1142      Component Value Date/Time   CALCIUM 10.1 10/08/2016 1142   ALKPHOS 58 10/08/2016 1142   AST 9 10/08/2016 1142   ALT 14 10/08/2016 1142   BILITOT 0.3 10/08/2016 1142       IMPRESSION AND PLAN:   Premenopausal abnormal uterine bleeding, heavy and painless. This could be the beginning of anovulatory perimenopausal bleeding, but the amount of bleeding doesn't quite fit this. Pt needs appt with GYN (Dr. Donnelly Angelica in [place]) for further eval. Check CBC w/diff today. UA: unable to interpret due to it being so contaminated with blood from vagina. Send urine for c/s---no abx at this time. GC/chlam cervical testing done. Rx'd progesterone 10mg , 1 tab po qd x 10d. Heavy smoker, too high risk for estrogen at this time.  An After Visit Summary was printed and given to the patient.  FOLLOW UP: To be determined based on results of pending workup.  Signed:  Crissie Sickles, MD           01/17/2017

## 2017-01-18 LAB — CBC WITH DIFFERENTIAL/PLATELET
Basophils Absolute: 30 cells/uL (ref 0–200)
Basophils Relative: 0.3 %
Eosinophils Absolute: 162 cells/uL (ref 15–500)
Eosinophils Relative: 1.6 %
HCT: 44.8 % (ref 35.0–45.0)
Hemoglobin: 15.3 g/dL (ref 11.7–15.5)
Lymphs Abs: 3575 cells/uL (ref 850–3900)
MCH: 30.1 pg (ref 27.0–33.0)
MCHC: 34.2 g/dL (ref 32.0–36.0)
MCV: 88 fL (ref 80.0–100.0)
MPV: 9.1 fL (ref 7.5–12.5)
Monocytes Relative: 5 %
Neutro Abs: 5828 cells/uL (ref 1500–7800)
Neutrophils Relative %: 57.7 %
Platelets: 343 10*3/uL (ref 140–400)
RBC: 5.09 10*6/uL (ref 3.80–5.10)
RDW: 13.1 % (ref 11.0–15.0)
Total Lymphocyte: 35.4 %
WBC mixed population: 505 cells/uL (ref 200–950)
WBC: 10.1 10*3/uL (ref 3.8–10.8)

## 2017-01-18 LAB — CERVICOVAGINAL ANCILLARY ONLY
Chlamydia: NEGATIVE
Neisseria Gonorrhea: NEGATIVE

## 2017-01-19 LAB — URINE CULTURE
MICRO NUMBER:: 81198873
SPECIMEN QUALITY:: ADEQUATE

## 2017-01-21 ENCOUNTER — Other Ambulatory Visit: Payer: Self-pay | Admitting: Family Medicine

## 2017-01-25 ENCOUNTER — Telehealth: Payer: Self-pay | Admitting: *Deleted

## 2017-01-25 NOTE — Telephone Encounter (Signed)
Pt LMOM on 01/25/17 at 1:54pm stating that she took the medication Dr. Anitra Lauth prescribed for her bleeding and it stopped for 2 days. She stated that the bleeding has started back. She wants to know if Dr. Anitra Lauth will send in more of that medication to get her by til she sees her GYN. Please advise. Thanks.

## 2017-01-25 NOTE — Telephone Encounter (Signed)
SW pt and she stated that right now its just a light pink, just starting like it did before but not heavy right now.

## 2017-01-25 NOTE — Telephone Encounter (Signed)
Should still have 2 doses of progesterone.  Did she take this med incorrectly and run out early?  If she did take it all and her bleeding stopped and then it returned the day after she finished it, this is what is supposed to happen--as long as the bleeding is like her normal menses.    Basically, there should be no need for any further progesterone at this time. -thx

## 2017-01-25 NOTE — Telephone Encounter (Signed)
SW pt and she stated that she only has one tablet left. She stated that she doesn't remember taking 2 but she might have. I advised her to finish what she has and no further progesterone should be needed at this time. She voiced understanding.

## 2017-01-25 NOTE — Telephone Encounter (Signed)
Is the bleeding severe, like when she was here last, or is it like a normal menstrual period?

## 2017-02-08 ENCOUNTER — Other Ambulatory Visit: Payer: Self-pay

## 2017-02-08 ENCOUNTER — Encounter: Payer: Self-pay | Admitting: Family Medicine

## 2017-02-08 ENCOUNTER — Ambulatory Visit: Payer: Commercial Managed Care - PPO | Admitting: Family Medicine

## 2017-02-08 ENCOUNTER — Ambulatory Visit (HOSPITAL_BASED_OUTPATIENT_CLINIC_OR_DEPARTMENT_OTHER)
Admission: RE | Admit: 2017-02-08 | Discharge: 2017-02-08 | Disposition: A | Discharge status: Home / Self Care | Payer: Commercial Managed Care - PPO | Source: Ambulatory Visit | Attending: Family Medicine | Admitting: Family Medicine

## 2017-02-08 VITALS — BP 126/85 | HR 93 | Temp 98.0°F | Resp 16 | Ht 62.0 in | Wt 266.8 lb

## 2017-02-08 DIAGNOSIS — M7989 Other specified soft tissue disorders: Secondary | ICD-10-CM | POA: Insufficient documentation

## 2017-02-08 DIAGNOSIS — R600 Localized edema: Secondary | ICD-10-CM | POA: Diagnosis not present

## 2017-02-08 DIAGNOSIS — R609 Edema, unspecified: Secondary | ICD-10-CM | POA: Diagnosis not present

## 2017-02-08 DIAGNOSIS — M21969 Unspecified acquired deformity of unspecified lower leg: Secondary | ICD-10-CM | POA: Diagnosis not present

## 2017-02-08 HISTORY — PX: OTHER SURGICAL HISTORY: SHX169

## 2017-02-08 LAB — COMPREHENSIVE METABOLIC PANEL
ALT: 11 U/L (ref 0–35)
AST: 9 U/L (ref 0–37)
Albumin: 4.2 g/dL (ref 3.5–5.2)
Alkaline Phosphatase: 55 U/L (ref 39–117)
BUN: 8 mg/dL (ref 6–23)
CO2: 30 mEq/L (ref 19–32)
Calcium: 9.4 mg/dL (ref 8.4–10.5)
Chloride: 102 mEq/L (ref 96–112)
Creatinine, Ser: 0.72 mg/dL (ref 0.40–1.20)
GFR: 91.46 mL/min (ref >60.00)
Glucose, Bld: 102 mg/dL — ABNORMAL HIGH (ref 70–99)
Potassium: 4.4 mEq/L (ref 3.5–5.1)
Sodium: 140 mEq/L (ref 135–145)
Total Bilirubin: 0.4 mg/dL (ref 0.2–1.2)
Total Protein: 6.5 g/dL (ref 6.0–8.3)

## 2017-02-08 MED ORDER — FUROSEMIDE 20 MG PO TABS: 20.0000 mg | ORAL_TABLET | Freq: Every day | ORAL | 0 refills | Status: DC

## 2017-02-08 NOTE — Progress Notes (Signed)
OFFICE VISIT  02/08/2017   CC:  Chief Complaint  Patient presents with  . Leg Swelling   HPI:    Patient is a 51 y.o. Caucasian female who presents for both legs swelling. Over last 2-3 mo has noted insidious swelling in R leg, then L one started swelling a few weeks ago. X-ray of right knee by pain mgmt was normal.  Denies feeling any edema in arms or abdomen.  No SOB or CP. Mild DOE at baseline due to chronic bronchitis, obesity, and deconditioning. Hurts in LL's from tightness in LL's.  Hard for her to even stand up from a sitting position. Denies high sodium diet.  States she hardly eats anything, yet she has gained 14 lbs over the last 4 months. I gave her a 16 day prednisone taper about 3 mo ago for acute exacerbation of COPD.  Three weeks ago I gave her 10d of progesterone for dysfunctional uterine bleeding.  Of note, her vaginal bleeding stopped. She continues to smoke heavily.  She has no personal hx of DVT or PE. No hx of CHF.  No hx of liver impairment or renal failure.   Past Medical History:  Diagnosis Date  . Anxiety   . Arthritis   . Bipolar 1 disorder ([place])   . Bronchitis, mucopurulent recurrent ([place])    PFTs 04/2016 showed no sign of copd or asthma, plus she actually had worse FEV1 after albuterol.  . Chronic pain syndrome    chronic low back pain (Pain mgmt= Dr. [name]).  MRI showed no signif disc dz, only showed some facet arthropathy at L4-5, with joint diastasis at L4-5--plan as of 11/07/16 neurosurg eval is bilat facet injections.  . Colitis due to Clostridium difficile 2001  . GERD (gastroesophageal reflux disease)   . Hepatic hemangioma   . Hepatic steatosis 06/2016   Noted incidentally on CT chest  . History of Salmonella gastroenteritis   . Hyperlipidemia    a. Noted 02/2014.  Marland Kitchen Hypertension    HCTZ started 07/2015  . IBS (irritable bowel syndrome)   . Iron deficiency anemia 05/2014   per pt it is not due to vaginal blood loss; hemoccults sent to pt  in mail 413/16.  . Lumbar spondylosis    mild, mainly focused at L4-5 and L5-S1.  . Microscopic hematuria    Intermittent (no w/u done yet, as of 03/2014)  . Migraine headache   . Morbid obesity ([place])   . Multiple lipomas 09/2016   left upper arm--very small ones.  . Peripheral edema Fall 2018   R>L, non-pitting for the most part--venous doppler neg for DVT 01/2017.  Marland Kitchen Polycystic ovarian syndrome   . Pruritic dermatitis 2015/2016   +Scabies prep at [facility] 07/2014. (Primarily pruritic skin, but eventually a subtle rash as well)  . Rectal cancer ([place])    a. Followed by Dr. Gala Romney, dx 2000-2001. Tumor removed from rectal.  . Recurrent sinusitis    +allergic rhinitis  . Right shoulder pain 2017   Murphy/Wainer: RC bursitis + RC tear (MRI)--injection trial is plan per pt report 05/04/16.  . Tobacco dependence   . Vitamin D deficiency     Past Surgical History:  Procedure Laterality Date  . CARDIOVASCULAR STRESS TEST  03/24/14   Lexscan MIBI: mild anterior ischemia?  Cardiac CT angiogram recommended/done.  . CHOLECYSTECTOMY    . COLONOSCOPY  05/05/2007   adenoma  . coronary CT angio  03/29/14   Two vessel dz/moderate stenosis of mid LAD  and proximal RCA; cath recommended.  Marland Kitchen DILATION AND CURETTAGE OF UTERUS     for vaginal bleeding  . JOINT REPLACEMENT    . LEFT HEART CATHETERIZATION WITH CORONARY ANGIOGRAM N/A 03/31/2014   Performed by Belva Crome, MD at [facility] CATH LAB  . LOWER EXT VENOUS DOPPLERS Bilateral 02/08/2017   NEG for DVT (bilat)  . PFTs  04/2016   No sign of COPD or asthma; FEV 1 worse after albut.  . Situ removed  age 39   High-grade rectal adenoma removed from rectum   . TRANSTHORACIC ECHOCARDIOGRAM  03/23/14   Normal    Outpatient Medications Prior to Visit  Medication Sig Dispense Refill  . aspirin 81 MG chewable tablet Chew 1 tablet (81 mg total) by mouth daily. 14 tablet 0  . busPIRone (BUSPAR) 15 MG tablet 1 in the morning and 2 at bedtime    .  cetirizine (ZYRTEC) 10 MG tablet TAKE ONE TABLET BY MOUTH ONCE DAILY 30 tablet 11  . clonazePAM (KLONOPIN) 0.5 MG tablet TAKE 1 TABLET BY MOUTH ONCE DAILY DURING THE DAY AS NEEDED, THEN TAKE 2 TABLETS AT BEDTIME. 90 tablet 5  . cyclobenzaprine (FLEXERIL) 10 MG tablet TAKE ONE TABLET BY MOUTH EVERY 6 HOURS AS NEEDED (SPASM RELATED TO HEADACHES) 42 tablet 5  . diphenhydrAMINE (BENADRYL) 25 MG tablet Take 1 tablet (25 mg total) by mouth every 6 (six) hours. (Patient taking differently: Take 25-50 mg by mouth every 6 (six) hours as needed for itching or allergies. ) 20 tablet 0  . DULoxetine (CYMBALTA) 60 MG capsule Take 60 mg by mouth daily.    . fluticasone (FLONASE) 50 MCG/ACT nasal spray Place 2 sprays into both nostrils daily. 16 g 1  . hydrOXYzine (ATARAX/VISTARIL) 25 MG tablet Take 25 mg by mouth 3 (three) times daily.     Marland Kitchen IRON-FOLIC ACID PO Take 65 mg by mouth daily.     Marland Kitchen lamoTRIgine (LAMICTAL) 200 MG tablet Take 200 mg by mouth at bedtime.    Melynda Ripple Tosylate (SYMPROIC) 0.2 MG TABS Take 1 tablet by mouth daily.    . nitroGLYCERIN (NITROSTAT) 0.4 MG SL tablet Place 1 tablet (0.4 mg total) under the tongue every 5 (five) minutes as needed for chest pain. 20 tablet 0  . oxyCODONE-acetaminophen (PERCOCET) 7.5-325 MG tablet Take 1 tablet by mouth every 8 (eight) hours as needed for severe pain.    . pantoprazole (PROTONIX) 40 MG tablet TAKE ONE TABLET BY MOUTH ONCE DAILY 90 tablet 3  . pregabalin (LYRICA) 75 MG capsule Take 75 mg by mouth 3 (three) times daily.     . promethazine (PHENERGAN) 25 MG tablet Take 1 tablet (25 mg total) by mouth every 6 (six) hours as needed for nausea or vomiting. 30 tablet 2  . rOPINIRole (REQUIP) 0.25 MG tablet TAKE THREE TABLETS BY MOUTH ONCE DAILY AT BEDTIME 90 tablet 6  . ibuprofen (ADVIL,MOTRIN) 800 MG tablet Take 1 tablet (800 mg total) by mouth 3 (three) times daily. (Patient not taking: Reported on 02/08/2017) 21 tablet 0  . medroxyPROGESTERone  (PROVERA) 10 MG tablet Take 1 tablet (10 mg total) by mouth daily. (Patient not taking: Reported on 02/08/2017) 10 tablet 0   No facility-administered medications prior to visit.     Allergies  Allergen Reactions  . Contrast Media [Iodinated Diagnostic Agents] Anaphylaxis    Needs 13-hour prep  . Shellfish Allergy Anaphylaxis  . Codeine Nausea And Vomiting and Other (See Comments)    Hallucinations,  Also sees people that are not there   . Ivermectin Nausea And Vomiting    N/V and rash  . Betadine [Povidone Iodine] Rash  . Latex Hives  . Lipitor [Atorvastatin] Itching  . Permethrin Itching  . Tape Dermatitis    Paper tape and clear    ROS As per HPI  PE: Blood pressure 126/85, pulse 93, temperature 98 F (36.7 C), temperature source Oral, resp. rate 16, height 5\' 2"  (1.575 m), weight 266 lb 12 oz (121 kg), SpO2 93 %. Gen: Alert, well appearing.  Patient is oriented to person, place, time, and situation. AFFECT: pleasant, lucid thought and speech. CV: RRR, no m/r/g.   LUNGS: CTA bilat, nonlabored resps, decent aeration in all lung fields.  Moderate prolongation of exp phase per her baseline. EXT: no cyanosis or clubbing.  She has notable excessive fatty tissue in legs, non-pitting edema bilat (R>L), 1+ pitting edema in pretibial regions bilat.  R calf circumference 10 cm below inferior border of patella is 48 cm, Left is 44.5 cm. Tender to palpation in LL's anteriorly.  No pitting edema in ankles or feet.   LABS:    Chemistry      Component Value Date/Time   NA 140 02/08/2017 1420   K 4.4 02/08/2017 1420   CL 102 02/08/2017 1420   CO2 30 02/08/2017 1420   BUN 8 02/08/2017 1420   CREATININE 0.72 02/08/2017 1420      Component Value Date/Time   CALCIUM 9.4 02/08/2017 1420   ALKPHOS 55 02/08/2017 1420   AST 9 02/08/2017 1420   ALT 11 02/08/2017 1420   BILITOT 0.4 02/08/2017 1420     Lab Results  Component Value Date   WBC 10.1 01/17/2017   HGB 15.3 01/17/2017    HCT 44.8 01/17/2017   MCV 88.0 01/17/2017   PLT 343 01/17/2017    IMPRESSION AND PLAN:  1) Subacute LE swelling; almost entirely non-pitting edema present, with calf circumference asymmetry. Will get bilat LE venous dopplers to r/o DVT. Suspect acute worsening of chronic venous insufficiency: she is at high risk for this due to her smoking, obesity, and COPD. If no DVT, then I advised pt to start furosemide 20mg  qd, gave her a rx for compression stockings, recommended bid elevation for 54min, and continue low Na diet. Will check serum protein, hepatic panel, Cr, and electrolytes today.  She was unable to give a urine sample for check for proteinuria today. Looking back, steroid taper in August, and progesterone x 10d a few weeks ago may have "unmasked" her chronic venous insufficiency due to the fluid retention side effect of these meds. She is also on lyrica chronically, which can have side effect of periph edema.  An After Visit Summary was printed and given to the patient.  FOLLOW UP: Return in about 2 weeks (around 02/22/2017) for f/u LE swelling (30 min).  Signed:  Crissie Sickles, MD           02/08/2017

## 2017-02-18 ENCOUNTER — Telehealth: Payer: Self-pay

## 2017-02-18 NOTE — Telephone Encounter (Signed)
Patient called and left a message for [place] to return call. Patient called and informed that her CMP (labs) were normal per Dr. Anitra Lauth verbal results. Patient stated that she had a reaction to the furosemide- rash, face swelling and hard to breath. Patient did not go to ER but took Benadryl and stopped the furosemide 3 days ago. Symptoms are gone today.Patient still has c/o not urinating well and swelling in lower extremities.

## 2017-02-18 NOTE — Telephone Encounter (Signed)
OK. Furosemide has been added to her allergy list. I do not feel like a trial of a different fluid pill is indicated at this time, especially since an allergy to one can also mean a higher risk of allergy to a different one. I am pretty sure her swelling is related to fluid retention from some of her medications (lyrica, buspar, cymbalta, recent prednisone and progesterone). Reassure her that her kidneys are functioning normally. Continue to elevate legs for 20 min 1-2 times per day, wear compression stockings, and try to eat a low sodium diet.  No further testing or new meds at this time.-thx

## 2017-02-19 NOTE — Telephone Encounter (Signed)
Patient notified and verbalized understanding. 

## 2017-02-21 ENCOUNTER — Other Ambulatory Visit: Payer: Self-pay | Admitting: Family Medicine

## 2017-02-21 NOTE — Telephone Encounter (Signed)
[facility] [place], [place]  RF request for requip LOV: 02/08/17 Next ov: 02/27/17 Last written: 08/02/16 #90 w/ 6RF  Please advise. Thanks.

## 2017-02-27 ENCOUNTER — Ambulatory Visit: Payer: Commercial Managed Care - PPO | Admitting: Family Medicine

## 2017-03-14 DIAGNOSIS — M545 Low back pain: Secondary | ICD-10-CM | POA: Diagnosis not present

## 2017-03-14 DIAGNOSIS — G43701 Chronic migraine without aura, not intractable, with status migrainosus: Secondary | ICD-10-CM | POA: Diagnosis not present

## 2017-03-14 DIAGNOSIS — M25511 Pain in right shoulder: Secondary | ICD-10-CM | POA: Diagnosis not present

## 2017-03-21 ENCOUNTER — Ambulatory Visit: Payer: Self-pay | Admitting: *Deleted

## 2017-03-21 NOTE — Telephone Encounter (Signed)
Called in c/o feeling dizzy  since just before Christmas.   It started with her sitting up on the couch from laying down and "feeling like the fan (which she had blowing on her) was blowing through her ear."   "My ears feel gunky"    Denies having problems with ear infections or dizziness.   See notes below. I informed her she needed to be seen within 24 hours but she only wants to see [place].   She was not open to seeing another provider in the [facility] or another [employer].     She promised me she would go to the [facility] ED if she got worse or experienced any s/s that I went over with her from the dizziness protocol along with s/s of a stroke.   She assured me she would go to the ED.   She will not be driving.   She is having someone drive her where she needs to go. She verbalized understanding. Reason for Disposition . [1] MODERATE dizziness (e.g., vertigo; feels very unsteady, interferes with normal activities) AND [2] has NOT been evaluated by physician for this  Answer Assessment - Initial Assessment Questions 1. DESCRIPTION: "Describe your dizziness."     It comes and goes.   I woke up the other morning the room was spinning. 2. VERTIGO: "Do you feel like either you or the room is spinning or tilting?"      Yes.  It's spinning.   I'm having trouble walking straight.   I feel like the room is moving.    I feel it when I get up from sitting or standing.  I've had it happen while laying down.    Feeling a little nauseas on and off. 3. LIGHTHEADED: "Do you feel lightheaded?" (e.g., somewhat faint, woozy, weak upon standing)     Twice I've felt like I might pass out.    Once in the tub and the other time I was walking and got to the couch and sit down. 4. SEVERITY: "How bad is it?"  "Can you walk?"   - MILD - Feels unsteady but walking normally.   - MODERATE - Feels very unsteady when walking, but not falling; interferes with normal activities (e.g., school, work) .   -  SEVERE - Unable to walk without falling (requires assistance).     I'm being weaned off the Lyrica.   I was on 3 day now taking 2 a day. 5. ONSET:  "When did the dizziness begin?"     Just before Christmas 6. AGGRAVATING FACTORS: "Does anything make it worse?" (e.g., standing, change in head position)     Standing up and sitting up.   I try to stay close to something I can grab onto .  I'm standing now but not feeling dizzy.   I had my sister in law help me to the bathroom earlier today. 7. CAUSE: "What do you think is causing the dizziness?"     Maybe vertigo.  Now I think it's my ear. 8. RECURRENT SYMPTOM: "Have you had dizziness before?" If so, ask: "When was the last time?" "What happened that time?"     oday 9. OTHER SYMPTOMS: "Do you have any other symptoms?" (e.g., headache, weakness, numbness, vomiting, earache)     I had the fan on and it felt like the air went through my ear.    I have to make them pop because they feel clogged up.   I open  my mouth real wide.    No problem with ears normally.   I've had ear infections and swimmers ear years ago but nothing in the last few years. 10. PREGNANCY: "Is there any chance you are pregnant?" "When was your last menstrual period?"       No  Protocols used: DIZZINESS - VERTIGO-A-AH

## 2017-03-24 NOTE — Progress Notes (Deleted)
OFFICE VISIT  03/24/2017   CC: No chief complaint on file.    HPI:    Patient is a 51 y.o. Caucasian female who presents for dizziness.  Past Medical History:  Diagnosis Date  . Anxiety   . Arthritis   . Bipolar 1 disorder ([place])   . Bronchitis, mucopurulent recurrent ([place])    PFTs 04/2016 showed no sign of copd or asthma, plus she actually had worse FEV1 after albuterol.  . Chronic pain syndrome    chronic low back pain (Pain mgmt= Dr. [name]).  MRI showed no signif disc dz, only showed some facet arthropathy at L4-5, with joint diastasis at L4-5--plan as of 11/07/16 neurosurg eval is bilat facet injections.  . Colitis due to Clostridium difficile 2001  . GERD (gastroesophageal reflux disease)   . Hepatic hemangioma   . Hepatic steatosis 06/2016   Noted incidentally on CT chest  . History of Salmonella gastroenteritis   . Hyperlipidemia    a. Noted 02/2014.  Marland Kitchen Hypertension    HCTZ started 07/2015  . IBS (irritable bowel syndrome)   . Iron deficiency anemia 05/2014   per pt it is not due to vaginal blood loss; hemoccults sent to pt in mail 413/16.  . Lumbar spondylosis    mild, mainly focused at L4-5 and L5-S1.  . Microscopic hematuria    Intermittent (no w/u done yet, as of 03/2014)  . Migraine headache   . Morbid obesity ([place])   . Multiple lipomas 09/2016   left upper arm--very small ones.  . Peripheral edema Fall 2018   R>L, non-pitting for the most part--venous doppler neg for DVT 01/2017.  Marland Kitchen Polycystic ovarian syndrome   . Pruritic dermatitis 2015/2016   +Scabies prep at [facility] 07/2014. (Primarily pruritic skin, but eventually a subtle rash as well)  . Rectal cancer ([place])    a. Followed by Dr. Gala Romney, dx 2000-2001. Tumor removed from rectal.  . Recurrent sinusitis    +allergic rhinitis  . Right shoulder pain 2017   Murphy/Wainer: RC bursitis + RC tear (MRI)--injection trial is plan per pt report 05/04/16.  . Tobacco dependence   . Vitamin D deficiency      Past Surgical History:  Procedure Laterality Date  . CARDIOVASCULAR STRESS TEST  03/24/14   Lexscan MIBI: mild anterior ischemia?  Cardiac CT angiogram recommended/done.  . CHOLECYSTECTOMY    . COLONOSCOPY  05/05/2007   adenoma  . coronary CT angio  03/29/14   Two vessel dz/moderate stenosis of mid LAD and proximal RCA; cath recommended.  Marland Kitchen DILATION AND CURETTAGE OF UTERUS     for vaginal bleeding  . JOINT REPLACEMENT    . LEFT HEART CATHETERIZATION WITH CORONARY ANGIOGRAM N/A 03/31/2014   Normal coronaries, EF 30-86%, diastolic dysfunction.  Procedure: LEFT HEART CATHETERIZATION WITH CORONARY ANGIOGRAM;  Surgeon: Sinclair Grooms, MD;  Location: [facility] CATH LAB;  Service: Cardiovascular;  Laterality: N/A;  . LOWER EXT VENOUS DOPPLERS Bilateral 02/08/2017   NEG for DVT (bilat)  . PFTs  04/2016   No sign of COPD or asthma; FEV 1 worse after albut.  . Situ removed  age 83   High-grade rectal adenoma removed from rectum   . TRANSTHORACIC ECHOCARDIOGRAM  03/23/14   Normal    Outpatient Medications Prior to Visit  Medication Sig Dispense Refill  . aspirin 81 MG chewable tablet Chew 1 tablet (81 mg total) by mouth daily. 14 tablet 0  . busPIRone (BUSPAR) 15 MG tablet 1 in the  morning and 2 at bedtime    . cetirizine (ZYRTEC) 10 MG tablet TAKE ONE TABLET BY MOUTH ONCE DAILY 30 tablet 11  . clonazePAM (KLONOPIN) 0.5 MG tablet TAKE 1 TABLET BY MOUTH ONCE DAILY DURING THE DAY AS NEEDED, THEN TAKE 2 TABLETS AT BEDTIME. 90 tablet 5  . cyclobenzaprine (FLEXERIL) 10 MG tablet TAKE ONE TABLET BY MOUTH EVERY 6 HOURS AS NEEDED (SPASM RELATED TO HEADACHES) 42 tablet 5  . diphenhydrAMINE (BENADRYL) 25 MG tablet Take 1 tablet (25 mg total) by mouth every 6 (six) hours. (Patient taking differently: Take 25-50 mg by mouth every 6 (six) hours as needed for itching or allergies. ) 20 tablet 0  . DULoxetine (CYMBALTA) 60 MG capsule Take 60 mg by mouth daily.    . fluticasone (FLONASE) 50 MCG/ACT nasal spray  Place 2 sprays into both nostrils daily. 16 g 1  . hydrOXYzine (ATARAX/VISTARIL) 25 MG tablet Take 25 mg by mouth 3 (three) times daily.     Marland Kitchen IRON-FOLIC ACID PO Take 65 mg by mouth daily.     Marland Kitchen lamoTRIgine (LAMICTAL) 200 MG tablet Take 200 mg by mouth at bedtime.    Melynda Ripple Tosylate (SYMPROIC) 0.2 MG TABS Take 1 tablet by mouth daily.    . nitroGLYCERIN (NITROSTAT) 0.4 MG SL tablet Place 1 tablet (0.4 mg total) under the tongue every 5 (five) minutes as needed for chest pain. 20 tablet 0  . oxyCODONE-acetaminophen (PERCOCET) 7.5-325 MG tablet Take 1 tablet by mouth every 8 (eight) hours as needed for severe pain.    . pantoprazole (PROTONIX) 40 MG tablet TAKE ONE TABLET BY MOUTH ONCE DAILY 90 tablet 3  . pregabalin (LYRICA) 75 MG capsule Take 75 mg by mouth 3 (three) times daily.     . promethazine (PHENERGAN) 25 MG tablet Take 1 tablet (25 mg total) by mouth every 6 (six) hours as needed for nausea or vomiting. 30 tablet 2  . rOPINIRole (REQUIP) 0.25 MG tablet TAKE 3 TABLETS BY MOUTH ONCE DAILY AT BEDTIME 90 tablet 6   No facility-administered medications prior to visit.     Allergies  Allergen Reactions  . Contrast Media [Iodinated Diagnostic Agents] Anaphylaxis    Needs 13-hour prep  . Shellfish Allergy Anaphylaxis  . Codeine Nausea And Vomiting and Other (See Comments)    Hallucinations, Also sees people that are not there   . Furosemide Rash    Rash, soB.  . Ivermectin Nausea And Vomiting    N/V and rash  . Betadine [Povidone Iodine] Rash  . Latex Hives  . Lipitor [Atorvastatin] Itching  . Permethrin Itching  . Tape Dermatitis    Paper tape and clear    ROS As per HPI  PE: There were no vitals taken for this visit. ***  LABS:    Chemistry      Component Value Date/Time   NA 140 02/08/2017 1420   K 4.4 02/08/2017 1420   CL 102 02/08/2017 1420   CO2 30 02/08/2017 1420   BUN 8 02/08/2017 1420   CREATININE 0.72 02/08/2017 1420      Component Value  Date/Time   CALCIUM 9.4 02/08/2017 1420   ALKPHOS 55 02/08/2017 1420   AST 9 02/08/2017 1420   ALT 11 02/08/2017 1420   BILITOT 0.4 02/08/2017 1420     Lab Results  Component Value Date   WBC 10.1 01/17/2017   HGB 15.3 01/17/2017   HCT 44.8 01/17/2017   MCV 88.0 01/17/2017   PLT 343  01/17/2017   Lab Results  Component Value Date   TSH 1.12 10/08/2016    IMPRESSION AND PLAN:  No problem-specific Assessment & Plan notes found for this encounter.   FOLLOW UP: No Follow-up on file.

## 2017-03-25 ENCOUNTER — Ambulatory Visit: Payer: Commercial Managed Care - PPO | Admitting: Family Medicine

## 2017-03-26 DIAGNOSIS — I7300 Raynaud's syndrome without gangrene: Secondary | ICD-10-CM

## 2017-03-26 HISTORY — DX: Raynaud's syndrome without gangrene: I73.00

## 2017-03-27 ENCOUNTER — Other Ambulatory Visit: Payer: Self-pay | Admitting: Family Medicine

## 2017-03-27 ENCOUNTER — Ambulatory Visit: Payer: Commercial Managed Care - PPO | Admitting: Family Medicine

## 2017-03-27 DIAGNOSIS — Z0289 Encounter for other administrative examinations: Secondary | ICD-10-CM

## 2017-03-27 NOTE — Telephone Encounter (Signed)
RF request for promethazine LOV: 02/08/17 Next ov: None Last written: 08/06/16 #30 w/ 2RF  Please advise. Thanks.

## 2017-03-29 ENCOUNTER — Other Ambulatory Visit: Payer: Self-pay

## 2017-03-29 DIAGNOSIS — M7989 Other specified soft tissue disorders: Secondary | ICD-10-CM

## 2017-04-12 ENCOUNTER — Ambulatory Visit (HOSPITAL_COMMUNITY)
Admission: RE | Admit: 2017-04-12 | Discharge: 2017-04-12 | Disposition: A | Discharge status: Home / Self Care | Payer: Commercial Managed Care - PPO | Source: Ambulatory Visit | Attending: Vascular Surgery | Admitting: Vascular Surgery

## 2017-04-12 ENCOUNTER — Encounter: Payer: Self-pay | Admitting: Vascular Surgery

## 2017-04-12 ENCOUNTER — Ambulatory Visit: Payer: Commercial Managed Care - PPO | Admitting: Vascular Surgery

## 2017-04-12 VITALS — BP 139/98 | HR 115 | Temp 97.6°F | Resp 20 | Ht 62.0 in | Wt 260.0 lb

## 2017-04-12 DIAGNOSIS — I872 Venous insufficiency (chronic) (peripheral): Secondary | ICD-10-CM | POA: Diagnosis not present

## 2017-04-12 DIAGNOSIS — M7989 Other specified soft tissue disorders: Secondary | ICD-10-CM

## 2017-04-12 NOTE — Progress Notes (Signed)
Requested by:  Tammi Sou, MD [address] [place], [place] [address]  Reason for consultation: bilateral leg swelling    History of Present Illness   Martha Espinoza is a 52 y.o. (December 17, 1965) female who presents with chief complaint: difficult standing and bilateral leg swelling.  Patient notes, onset of swelling 4 months ago, associated with no obvious trigger.  The patient's symptoms include: difficulty getting up, bilateral leg swelling worsening with standing, bursting sensation in R > L leg.  The patient has had no history of DVT, history of pregnancy, known history of varicose vein, no history of venous stasis ulcers, no history of  Lymphedema and no history of skin changes in lower legs.  There is no family history of venous disorders.  The patient has not used compression stockings in the past.  Past Medical History:  Diagnosis Date  . Anxiety   . Arthritis   . Bipolar 1 disorder ([place])   . Bronchitis, mucopurulent recurrent ([place])    PFTs 04/2016 showed no sign of copd or asthma, plus she actually had worse FEV1 after albuterol.  . Chronic pain syndrome    chronic low back pain (Pain mgmt= Dr. [name]).  MRI showed no signif disc dz, only showed some facet arthropathy at L4-5, with joint diastasis at L4-5--plan as of 11/07/16 neurosurg eval is bilat facet injections.  . Colitis due to Clostridium difficile 2001  . GERD (gastroesophageal reflux disease)   . Hepatic hemangioma   . Hepatic steatosis 06/2016   Noted incidentally on CT chest  . History of Salmonella gastroenteritis   . Hyperlipidemia    a. Noted 02/2014.  Marland Kitchen Hypertension    HCTZ started 07/2015  . IBS (irritable bowel syndrome)   . Iron deficiency anemia 05/2014   per pt it is not due to vaginal blood loss; hemoccults sent to pt in mail 413/16.  . Lumbar spondylosis    mild, mainly focused at L4-5 and L5-S1.  . Microscopic hematuria    Intermittent (no w/u done yet, as of 03/2014)  . Migraine  headache   . Morbid obesity ([place])   . Multiple lipomas 09/2016   left upper arm--very small ones.  . Peripheral edema Fall 2018   R>L, non-pitting for the most part--venous doppler neg for DVT 01/2017.  Marland Kitchen Polycystic ovarian syndrome   . Pruritic dermatitis 2015/2016   +Scabies prep at [facility] 07/2014. (Primarily pruritic skin, but eventually a subtle rash as well)  . Rectal cancer ([place])    a. Followed by Dr. Gala Romney, dx 2000-2001. Tumor removed from rectal.  . Recurrent sinusitis    +allergic rhinitis  . Right shoulder pain 2017   Murphy/Wainer: RC bursitis + RC tear (MRI)--injection trial is plan per pt report 05/04/16.  . Tobacco dependence   . Vitamin D deficiency     Past Surgical History:  Procedure Laterality Date  . CARDIOVASCULAR STRESS TEST  03/24/14   Lexscan MIBI: mild anterior ischemia?  Cardiac CT angiogram recommended/done.  . CHOLECYSTECTOMY    . COLONOSCOPY  05/05/2007   adenoma  . coronary CT angio  03/29/14   Two vessel dz/moderate stenosis of mid LAD and proximal RCA; cath recommended.  Marland Kitchen DILATION AND CURETTAGE OF UTERUS     for vaginal bleeding  . JOINT REPLACEMENT    . LEFT HEART CATHETERIZATION WITH CORONARY ANGIOGRAM N/A 03/31/2014   Normal coronaries, EF 47-42%, diastolic dysfunction.  Procedure: LEFT HEART CATHETERIZATION WITH CORONARY ANGIOGRAM;  Surgeon: Sinclair Grooms, MD;  Location[facility] C[facility] LAB;  Service: Cardiovascular;  Laterality: N/A;  . LOWER EXT VENOUS DOPPLERS Bilateral 02/08/2017   NEG for DVT (bilat)  . PFTs  04/2016   No sign of COPD or asthma; FEV 1 worse after albut.  . Situ removed  age 3   High-grade rectal adenoma removed from rectum   . TRANSTHORACIC ECHOCARDIOGRAM  03/23/14   Normal    Social History   Socioeconomic History  . Marital status: Married    Spouse name: Not on file  . Number of children: 1  . Years of education: Not on file  . Highest education level: Not on file  Social Needs  . Financial resource  strain: Not on file  . Food insecurity - worry: Not on file  . Food insecurity - inability: Not on file  . Transportation needs - medical: Not on file  . Transportation needs - non-medical: Not on file  Occupational History  . Occupation: own Games developer: [employer]  Tobacco Use  . Smoking status: Current Every Day Smoker    Packs/day: 0.50    Years: 18.00    Pack years: 9.00    Types: Cigarettes  . Smokeless tobacco: Never Used  . Tobacco comment: 25 years  Substance and Sexual Activity  . Alcohol use: No    Alcohol/week: 0.0 oz  . Drug use: No  . Sexual activity: No    Birth control/protection: None, Surgical  Other Topics Concern  . Not on file  Social History Narrative  . Not on file    Family History  Problem Relation Age of Onset  . Colon cancer Father 42  . Cancer Father   . Diverticulitis Mother   . Heart disease Mother   . Hypertension Mother   . Hyperlipidemia Mother   . Mental illness Mother   . Diabetes Mother   . Heart attack Mother   . Stroke Neg Hx     Current Outpatient Medications  Medication Sig Dispense Refill  . aspirin 81 MG chewable tablet Chew 1 tablet (81 mg total) by mouth daily. 14 tablet 0  . busPIRone (BUSPAR) 15 MG tablet 1 in the morning and 2 at bedtime    . cetirizine (ZYRTEC) 10 MG tablet TAKE ONE TABLET BY MOUTH ONCE DAILY 30 tablet 11  . clonazePAM (KLONOPIN) 0.5 MG tablet TAKE 1 TABLET BY MOUTH ONCE DAILY DURING THE DAY AS NEEDED, THEN TAKE 2 TABLETS AT BEDTIME. 90 tablet 5  . cyclobenzaprine (FLEXERIL) 10 MG tablet TAKE ONE TABLET BY MOUTH EVERY 6 HOURS AS NEEDED (SPASM RELATED TO HEADACHES) 42 tablet 5  . diphenhydrAMINE (BENADRYL) 25 MG tablet Take 1 tablet (25 mg total) by mouth every 6 (six) hours. (Patient taking differently: Take 25-50 mg by mouth every 6 (six) hours as needed for itching or allergies. ) 20 tablet 0  . DULoxetine (CYMBALTA) 60 MG capsule Take 60 mg by mouth daily.    .  fluticasone (FLONASE) 50 MCG/ACT nasal spray Place 2 sprays into both nostrils daily. 16 g 1  . hydrOXYzine (ATARAX/VISTARIL) 25 MG tablet Take 25 mg by mouth 3 (three) times daily.     Marland Kitchen IRON-FOLIC ACID PO Take 65 mg by mouth daily.     Marland Kitchen lamoTRIgine (LAMICTAL) 200 MG tablet Take 200 mg by mouth at bedtime.    Melynda Ripple Tosylate (SYMPROIC) 0.2 MG TABS Take 1 tablet by mouth daily.    Marland Kitchen  nitroGLYCERIN (NITROSTAT) 0.4 MG SL tablet Place 1 tablet (0.4 mg total) under the tongue every 5 (five) minutes as needed for chest pain. 20 tablet 0  . oxyCODONE-acetaminophen (PERCOCET) 7.5-325 MG tablet Take 1 tablet by mouth every 8 (eight) hours as needed for severe pain.    . pantoprazole (PROTONIX) 40 MG tablet TAKE ONE TABLET BY MOUTH ONCE DAILY 90 tablet 3  . pregabalin (LYRICA) 75 MG capsule Take 75 mg by mouth 3 (three) times daily.     . promethazine (PHENERGAN) 25 MG tablet TAKE 1 TABLET BY MOUTH EVERY 6 HOURS AS NEEDED FOR NAUSEA AND VOMITING 30 tablet 3  . rOPINIRole (REQUIP) 0.25 MG tablet TAKE 3 TABLETS BY MOUTH ONCE DAILY AT BEDTIME 90 tablet 6   No current facility-administered medications for this visit.     Allergies  Allergen Reactions  . Contrast Media [Iodinated Diagnostic Agents] Anaphylaxis    Needs 13-hour prep  . Shellfish Allergy Anaphylaxis  . Codeine Nausea And Vomiting and Other (See Comments)    Hallucinations, Also sees people that are not there   . Furosemide Rash    Rash, soB.  . Ivermectin Nausea And Vomiting    N/V and rash  . Betadine [Povidone Iodine] Rash  . Latex Hives  . Lipitor [Atorvastatin] Itching  . Permethrin Itching  . Tape Dermatitis    Paper tape and clear    REVIEW OF SYSTEMS (negative unless checked):   Cardiac:  []  Chest pain or chest pressure? []  Shortness of breath upon activity? []  Shortness of breath when lying flat? []  Irregular heart rhythm?  Vascular:  [x]  Pain in calf, thigh, or hip brought on by walking? []  Pain in feet  at night that wakes you up from your sleep? []  Blood clot in your veins? [x]  Leg swelling?  Pulmonary:  []  Oxygen at home? []  Productive cough? []  Wheezing?  Neurologic:  [x]  Sudden weakness in arms or legs? [x]  Sudden numbness in arms or legs? []  Sudden onset of difficult speaking or slurred speech? []  Temporary loss of vision in one eye? []  Problems with dizziness?  Gastrointestinal:  []  Blood in stool? []  Vomited blood?  Genitourinary:  []  Burning when urinating? []  Blood in urine?  Psychiatric:  [x]  Major depression  Hematologic:  []  Bleeding problems? []  Problems with blood clotting?  Dermatologic:  []  Rashes or ulcers?  Constitutional:  []  Fever or chills?  Ear/Nose/Throat:  []  Change in hearing? []  Nose bleeds? []  Sore throat?  Musculoskeletal:  []  Back pain? []  Joint pain? []  Muscle pain?   Physical Examination     Vitals:   04/12/17 1218  BP: (!) 139/98  Pulse: (!) 115  Resp: 20  Temp: 97.6 F (36.4 C)  TempSrc: Oral  SpO2: 95%  Weight: 260 lb (117.9 kg)  Height: 5\' 2"  (1.575 m)   Body mass index is 47.55 kg/m.  General Alert, O x 3, Obese, NAD  Head [place]/AT,    Ear/Nose/ Throat Hearing grossly intact, nares without erythema or drainage, oropharynx without Erythema or Exudate, Mallampati score: 3,   Eyes PERRLA, EOMI,    Neck Supple, mid-line trachea,    Pulmonary Sym exp, good B air movt, CTA B  Cardiac RRR, Nl S1, S2, no Murmurs, No rubs, No S3,S4  Vascular Vessel Right Left  Radial Palpable Palpable  Brachial Palpable Palpable  Carotid Palpable, No Bruit Palpable, No Bruit  Aorta Not palpable N/A  Femoral Palpable Palpable  Popliteal Not palpable Not palpable  PT  Palpable Palpable  DP Palpable Palpable    Gastro- intestinal soft, non-distended, non-tender to palpation, No guarding or rebound, no HSM, no masses, no CVAT B, No palpable prominent aortic pulse,    Musculo- skeletal M/S 5/5 throughout  , Extremities without  ischemic changes  , Non-pitting edema present: 1-2+ (L>R), No visible varicosities , No Lipodermatosclerosis present  Neurologic Cranial nerves 2-12 intact , Pain and light touch intact in extremities , Motor exam as listed above  Psychiatric Judgement intact, Mood & affect appropriate for pt's clinical situation  Dermatologic See M/S exam for extremity exam, No rashes otherwise noted  Lymphatic  Palpable lymph nodes: None    Non-invasive Vascular Imaging   BLE Venous Insufficiency Duplex (04/12/2017):  Vein Diameters: +-------------------+----------+---------+           Right (mm)Left (mm) +-------------------+----------+---------+ GSV at SFJ     1.04   1.08    +-------------------+----------+---------+ GSV at prox thigh 0.57   .99    +-------------------+----------+---------+ GSV at mid thigh  0.64   .55    +-------------------+----------+---------+ GSV at distal thigh.37    .55    +-------------------+----------+---------+ GSV at knee    .31    .58    +-------------------+----------+---------+ GSV prox calf   .17    .44    +-------------------+----------+---------+ GSV mid calf    .16    .27    +-------------------+----------+---------+ GSV dist calf   .17    .29    +-------------------+----------+---------+ SSV        .23    .26    +-------------------+----------+---------+   Final Interpretation: Right: Abnormal reflux times were noted in the great saphenous vein at the mid thigh. Left: Abnormal reflux times were noted in the common femoral vein, great saphenous vein at the groin, great saphenous vein at the proximal thigh, great saphenous vein at the mid thigh, and great saphenous vein at the mid calf. There is no evidence of  deep vein thrombosis in the lower extremity.   Outside Studies/Documentation   3 pages of outside documents were  reviewed including: outpatient neurology chart.   Medical Decision Making   Martha Espinoza is a 52 y.o. female who presents with: BLE chronic venous insufficiency (C3), varicose veins with complications, bilateral leg weakness (not vasculogenic)   Patient's sx are NOT related to venous disease.  The patient has palpable pedal pulses, so she does not have crtiical PAD that would lead to leg weakness due to ischemic neuropathy.  Based on the patient's history and examination, I recommend: compressive therapy.  I discussed with the patient the use of her 20-30 mm thigh high compression stockings and need for 3 month trial of such.  The patient will follow up in 3 months with my partners in the [facility] for evaluation for: possible R or L GSV EVLA.  Thank you for allowing [place] to participate in this patient's care.   Adele Barthel, MD, FACS [facility] Office: [phone] Pager: [phone]  04/12/2017, 12:43 PM

## 2017-04-13 ENCOUNTER — Encounter: Payer: Self-pay | Admitting: Family Medicine

## 2017-05-15 ENCOUNTER — Ambulatory Visit: Payer: Commercial Managed Care - PPO | Admitting: Family Medicine

## 2017-05-15 DIAGNOSIS — Z0289 Encounter for other administrative examinations: Secondary | ICD-10-CM

## 2017-05-16 DIAGNOSIS — M545 Low back pain: Secondary | ICD-10-CM | POA: Diagnosis not present

## 2017-05-16 DIAGNOSIS — G43701 Chronic migraine without aura, not intractable, with status migrainosus: Secondary | ICD-10-CM | POA: Diagnosis not present

## 2017-05-16 DIAGNOSIS — M25511 Pain in right shoulder: Secondary | ICD-10-CM | POA: Diagnosis not present

## 2017-05-20 ENCOUNTER — Encounter (HOSPITAL_COMMUNITY): Payer: Self-pay

## 2017-05-20 ENCOUNTER — Emergency Department (HOSPITAL_COMMUNITY)
Admission: EM | Admit: 2017-05-20 | Discharge: 2017-05-21 | Disposition: A | Discharge status: Home / Self Care | Payer: Commercial Managed Care - PPO | Attending: Emergency Medicine | Admitting: Emergency Medicine

## 2017-05-20 ENCOUNTER — Emergency Department (HOSPITAL_COMMUNITY): Payer: Commercial Managed Care - PPO

## 2017-05-20 ENCOUNTER — Other Ambulatory Visit: Payer: Self-pay

## 2017-05-20 DIAGNOSIS — J449 Chronic obstructive pulmonary disease, unspecified: Secondary | ICD-10-CM | POA: Insufficient documentation

## 2017-05-20 DIAGNOSIS — Z7982 Long term (current) use of aspirin: Secondary | ICD-10-CM | POA: Insufficient documentation

## 2017-05-20 DIAGNOSIS — R21 Rash and other nonspecific skin eruption: Secondary | ICD-10-CM | POA: Diagnosis not present

## 2017-05-20 DIAGNOSIS — I10 Essential (primary) hypertension: Secondary | ICD-10-CM | POA: Insufficient documentation

## 2017-05-20 DIAGNOSIS — R079 Chest pain, unspecified: Secondary | ICD-10-CM | POA: Diagnosis not present

## 2017-05-20 DIAGNOSIS — Z79899 Other long term (current) drug therapy: Secondary | ICD-10-CM | POA: Insufficient documentation

## 2017-05-20 DIAGNOSIS — F17210 Nicotine dependence, cigarettes, uncomplicated: Secondary | ICD-10-CM | POA: Diagnosis not present

## 2017-05-20 DIAGNOSIS — Z91040 Latex allergy status: Secondary | ICD-10-CM | POA: Insufficient documentation

## 2017-05-20 DIAGNOSIS — R0600 Dyspnea, unspecified: Secondary | ICD-10-CM | POA: Insufficient documentation

## 2017-05-20 DIAGNOSIS — R0602 Shortness of breath: Secondary | ICD-10-CM | POA: Diagnosis not present

## 2017-05-20 LAB — BASIC METABOLIC PANEL WITH GFR
Anion gap: 12 (ref 5–15)
BUN: 8 mg/dL (ref 6–20)
CO2: 25 mmol/L (ref 22–32)
Calcium: 8.9 mg/dL (ref 8.9–10.3)
Chloride: 98 mmol/L — ABNORMAL LOW (ref 101–111)
Creatinine, Ser: 0.67 mg/dL (ref 0.44–1.00)
GFR calc Af Amer: >60 mL/min
GFR calc non Af Amer: >60 mL/min
Glucose, Bld: 114 mg/dL — ABNORMAL HIGH (ref 65–99)
Potassium: 4 mmol/L (ref 3.5–5.1)
Sodium: 135 mmol/L (ref 135–145)

## 2017-05-20 LAB — CBC
HCT: 46.5 % — ABNORMAL HIGH (ref 36.0–46.0)
Hemoglobin: 15.2 g/dL — ABNORMAL HIGH (ref 12.0–15.0)
MCH: 29.7 pg (ref 26.0–34.0)
MCHC: 32.7 g/dL (ref 30.0–36.0)
MCV: 91 fL (ref 78.0–100.0)
Platelets: 278 K/uL (ref 150–400)
RBC: 5.11 MIL/uL (ref 3.87–5.11)
RDW: 14.4 % (ref 11.5–15.5)
WBC: 11.1 K/uL — ABNORMAL HIGH (ref 4.0–10.5)

## 2017-05-20 LAB — TROPONIN I: Troponin I: <0.03 ng/mL (ref <0.03)

## 2017-05-20 LAB — D-DIMER, QUANTITATIVE: D-Dimer, Quant: <0.27 ug{FEU}/mL (ref 0.00–0.50)

## 2017-05-20 LAB — BRAIN NATRIURETIC PEPTIDE: B Natriuretic Peptide: 9 pg/mL (ref 0.0–100.0)

## 2017-05-20 NOTE — ED Provider Notes (Addendum)
[facility] EMERGENCY DEPARTMENT Provider Note   CSN: [id number] Arrival date & time: 05/20/17  1956     History   Chief Complaint Chief Complaint  Patient presents with  . Shortness of Breath    HPI Martha Espinoza is a 52 y.o. female.  HPI Complains of shortness of breath onset approximately 2 weeks ago with minimal cough.  Not improved with inhalers.  Breathing is improved presently over earlier today.  Denies any chest pain denies fever.  No other associated symptoms Past Medical History:  Diagnosis Date  . Anxiety   . Arthritis   . Bipolar 1 disorder ([place])   . Bronchitis, mucopurulent recurrent ([place])    PFTs 04/2016 showed no sign of copd or asthma, plus she actually had worse FEV1 after albuterol.  . Chronic pain syndrome    chronic low back pain (Pain mgmt= Dr. [name]).  MRI showed no signif disc dz, only showed some facet arthropathy at L4-5, with joint diastasis at L4-5--plan as of 11/07/16 neurosurg eval is bilat facet injections.  . Colitis due to Clostridium difficile 2001  . GERD (gastroesophageal reflux disease)   . Hepatic hemangioma   . Hepatic steatosis 06/2016   Noted incidentally on CT chest  . History of Salmonella gastroenteritis   . Hyperlipidemia    a. Noted 02/2014.  Marland Kitchen Hypertension    HCTZ started 07/2015  . IBS (irritable bowel syndrome)   . Iron deficiency anemia 05/2014   per pt it is not due to vaginal blood loss; hemoccults sent to pt in mail 413/16.  . Lumbar spondylosis    mild, mainly focused at L4-5 and L5-S1.  . Microscopic hematuria    Intermittent (no w/u done yet, as of 03/2014)  . Migraine headache   . Morbid obesity ([place])   . Multiple lipomas 09/2016   left upper arm--very small ones.  . Peripheral edema Fall 2018   R>L, non-pitting for the most part--venous doppler neg for DVT 01/2017.  Saw Dr. Bridgett Larsson (Vasc) 04/12/17 and u/s showed some venous reflux dz but he felt her sx's in legs were NOT due to her venous insufficiency OR PAD.   Thigh high comp stockings rx'd.  . Polycystic ovarian syndrome   . Pruritic dermatitis 2015/2016   +Scabies prep at [facility] 07/2014. (Primarily pruritic skin, but eventually a subtle rash as well)  . Rectal cancer ([place])    a. Followed by Dr. Gala Romney, dx 2000-2001. Tumor removed from rectal.  . Recurrent sinusitis    +allergic rhinitis  . Right shoulder pain 2017   Murphy/Wainer: RC bursitis + RC tear (MRI)--injection trial is plan per pt report 05/04/16.  . Tobacco dependence   . Vitamin D deficiency     Patient Active Problem List   Diagnosis Date Noted  . Chronic venous insufficiency 04/12/2017  . Acute respiratory failure with hypoxia ([place]) 10/18/2015  . COPD exacerbation ([place]) 10/18/2015  . Drug reaction 07/23/2015  . Fever of unknown origin 07/22/2015  . Lactic acidosis 07/22/2015  . Hypokalemia 07/22/2015  . Nausea and vomiting 07/22/2015  . Chronic back pain 07/22/2015  . Abdominal pain 07/26/2014  . Nausea without vomiting 07/26/2014  . History of colon cancer 07/26/2014  . Iron deficiency anemia 07/07/2014  . Vitamin D deficiency 07/07/2014  . Abnormal nuclear stress test 03/31/2014  . Abnormal cardiac CT angiography 03/31/2014  . Hyperlipidemia 03/24/2014  . Morbid obesity ([place])   . Chest pain of uncertain etiology 96/78/9381  . COPD (chronic obstructive  pulmonary disease) ([place]) 03/23/2014  . Leukocytosis 03/23/2014  . Hyperglycemia 03/23/2014  . Bipolar 1 disorder ([place]) 03/23/2014  . Pruritic rash 03/23/2014  . Chronic anxiety 03/23/2014  . Tobacco abuse 03/23/2014  . Intractable migraine without aura and with status migrainosus 01/14/2014  . Sinusitis 07/02/2013  . Urinary frequency 07/02/2013  . Migraine 07/02/2013  . Anxiety state, unspecified 04/14/2013  . Obesity 04/14/2013  . Anemia 04/14/2013  . CONSTIPATION 10/12/2009  . CARCINOMA IN SITU OF RECTUM 08/25/2009  . GERD 08/25/2009  . IRRITABLE BOWEL SYNDROME 08/25/2009  . DIARRHEA, CHRONIC  08/25/2009  . HEMANGIOMA, HEPATIC 08/24/2009  . POLYCYSTIC OVARIAN DISEASE 08/24/2009  . Personal history of other diseases of digestive system 08/24/2009    Past Surgical History:  Procedure Laterality Date  . CARDIOVASCULAR STRESS TEST  03/24/14   Lexscan MIBI: mild anterior ischemia?  Cardiac CT angiogram recommended/done.  . CHOLECYSTECTOMY    . COLONOSCOPY  05/05/2007   adenoma  . coronary CT angio  03/29/14   Two vessel dz/moderate stenosis of mid LAD and proximal RCA; cath recommended.  Marland Kitchen DILATION AND CURETTAGE OF UTERUS     for vaginal bleeding  . JOINT REPLACEMENT    . LEFT HEART CATHETERIZATION WITH CORONARY ANGIOGRAM N/A 03/31/2014   Normal coronaries, EF 27-25%, diastolic dysfunction.  Procedure: LEFT HEART CATHETERIZATION WITH CORONARY ANGIOGRAM;  Surgeon: Sinclair Grooms, MD;  Location: [facility] CATH LAB;  Service: Cardiovascular;  Laterality: N/A;  . LOWER EXT VENOUS DOPPLERS Bilateral 02/08/2017   NEG for DVT (bilat)  . PFTs  04/2016   No sign of COPD or asthma; FEV 1 worse after albut.  . Situ removed  age 82   High-grade rectal adenoma removed from rectum   . TRANSTHORACIC ECHOCARDIOGRAM  03/23/14   Normal    OB History    Gravida Para Term Preterm AB Living   4 1 1   3 1    SAB TAB Ectopic Multiple Live Births   3               Home Medications    Prior to Admission medications   Medication Sig Start Date End Date Taking? Authorizing Provider  aspirin 81 MG chewable tablet Chew 1 tablet (81 mg total) by mouth daily. 09/06/12   Ripley Fraise, MD  busPIRone (BUSPAR) 15 MG tablet 1 in the morning and 2 at bedtime    [provider]  cetirizine (ZYRTEC) 10 MG tablet TAKE ONE TABLET BY MOUTH ONCE DAILY 01/21/17   McGowen, Adrian Blackwater, MD  clonazePAM (KLONOPIN) 0.5 MG tablet TAKE 1 TABLET BY MOUTH ONCE DAILY DURING THE DAY AS NEEDED, THEN TAKE 2 TABLETS AT BEDTIME. 12/19/16   McGowen, Adrian Blackwater, MD  cyclobenzaprine (FLEXERIL) 10 MG tablet TAKE ONE TABLET BY  MOUTH EVERY 6 HOURS AS NEEDED (SPASM RELATED TO HEADACHES) 07/10/16   McGowen, Adrian Blackwater, MD  diphenhydrAMINE (BENADRYL) 25 MG tablet Take 1 tablet (25 mg total) by mouth every 6 (six) hours. Patient taking differently: Take 25-50 mg by mouth every 6 (six) hours as needed for itching or allergies.  07/19/15   Fredia Sorrow, MD  DULoxetine (CYMBALTA) 60 MG capsule Take 60 mg by mouth daily.    [provider]  fluticasone (FLONASE) 50 MCG/ACT nasal spray Place 2 sprays into both nostrils daily. 08/24/14   Ward, Delice Bison, DO  hydrOXYzine (ATARAX/VISTARIL) 25 MG tablet Take 25 mg by mouth 3 (three) times daily.     [provider]  IRON-FOLIC  ACID PO Take 65 mg by mouth daily.     [provider]  lamoTRIgine (LAMICTAL) 200 MG tablet Take 200 mg by mouth at bedtime.    [provider]  Naldemedine Tosylate (SYMPROIC) 0.2 MG TABS Take 1 tablet by mouth daily.    [provider]  nitroGLYCERIN (NITROSTAT) 0.4 MG SL tablet Place 1 tablet (0.4 mg total) under the tongue every 5 (five) minutes as needed for chest pain. 05/04/16   McGowen, Adrian Blackwater, MD  oxyCODONE-acetaminophen (PERCOCET) 7.5-325 MG tablet Take 1 tablet by mouth every 8 (eight) hours as needed for severe pain.    [provider]  pantoprazole (PROTONIX) 40 MG tablet TAKE ONE TABLET BY MOUTH ONCE DAILY 08/28/16   McGowen, Adrian Blackwater, MD  pregabalin (LYRICA) 75 MG capsule Take 75 mg by mouth 3 (three) times daily.     [provider]  promethazine (PHENERGAN) 25 MG tablet TAKE 1 TABLET BY MOUTH EVERY 6 HOURS AS NEEDED FOR NAUSEA AND VOMITING 03/27/17   McGowen, Adrian Blackwater, MD  rOPINIRole (REQUIP) 0.25 MG tablet TAKE 3 TABLETS BY MOUTH ONCE DAILY AT BEDTIME 02/21/17   McGowen, Adrian Blackwater, MD  citalopram (CELEXA) 40 MG tablet Take 20 mg by mouth at bedtime.   06/18/11  [provider]  omeprazole (PRILOSEC) 20 MG capsule Take 40 mg by mouth daily.  06/18/11  [provider]     Family History Family History  Problem Relation Age of Onset  . Colon cancer Father 72  . Cancer Father   . Diverticulitis Mother   . Heart disease Mother   . Hypertension Mother   . Hyperlipidemia Mother   . Mental illness Mother   . Diabetes Mother   . Heart attack Mother   . Stroke Neg Hx     Social History Social History   Tobacco Use  . Smoking status: Current Every Day Smoker    Packs/day: 0.50    Years: 18.00    Pack years: 9.00    Types: Cigarettes  . Smokeless tobacco: Never Used  . Tobacco comment: 25 years  Substance Use Topics  . Alcohol use: No    Alcohol/week: 0.0 oz  . Drug use: No     Allergies   Contrast media [iodinated diagnostic agents]; Shellfish allergy; Codeine; Furosemide; Ivermectin; Betadine [povidone iodine]; Latex; Lipitor [atorvastatin]; Permethrin; and Tape   Review of Systems Review of Systems  Constitutional: Negative.   HENT: Negative.   Respiratory: Positive for cough and shortness of breath.   Cardiovascular: Negative.   Gastrointestinal: Negative.   Genitourinary:       Currently on menses  Musculoskeletal: Negative.   Skin: Positive for rash.       Rash for several weeks, diffuse, she reports her doctor tells her is secondary to being on Lamictal Lamictal has been discontinued  Neurological: Negative.   Psychiatric/Behavioral: Negative.   All other systems reviewed and are negative.    Physical Exam Updated Vital Signs BP (!) 145/72 (BP Location: Right Arm)   Pulse (!) 111   Temp 98.3 F (36.8 C) (Oral)   Resp (!) 24   Ht 5\' 1"  (1.549 m)   Wt 117.9 kg (260 lb)   SpO2 97%   BMI 49.13 kg/m   Physical Exam  Constitutional: She is oriented to person, place, and time. She appears well-developed and well-nourished. No distress.  HENT:  Head: Normocephalic and atraumatic.  Eyes: Conjunctivae are normal. Pupils are equal, round, and reactive to  light.  Neck: Neck supple. No tracheal deviation present. No  thyromegaly present.  Cardiovascular: Normal rate and regular rhythm.  No murmur heard. Pulmonary/Chest: Effort normal and breath sounds normal.  Abdominal: Soft. Bowel sounds are normal. She exhibits no distension. There is no tenderness.  Morbidly obese  Musculoskeletal: Normal range of motion. She exhibits no edema or tenderness.  Neurological: She is alert and oriented to person, place, and time. Coordination normal.  Skin: Skin is warm and dry. Rash noted.  Fine pinpoint nonblanching rash diffusely.  Psychiatric: She has a normal mood and affect.  Nursing note and vitals reviewed.    ED Treatments / Results  Labs (all labs ordered are listed, but only abnormal results are displayed) Labs Reviewed  BASIC METABOLIC PANEL - Abnormal; Notable for the following components:      Result Value   Chloride 98 (*)    Glucose, Bld 114 (*)    All other components within normal limits  CBC - Abnormal; Notable for the following components:   WBC 11.1 (*)    Hemoglobin 15.2 (*)    HCT 46.5 (*)    All other components within normal limits  TROPONIN I  BRAIN NATRIURETIC PEPTIDE  D-DIMER, QUANTITATIVE (NOT AT [facility])    EKG  EKG Interpretation  Date/Time:  Monday May 20 2017 20:08:35 EST Ventricular Rate:  109 PR Interval:  144 QRS Duration: 86 QT Interval:  344 QTC Calculation: 463 R Axis:   -57 Text Interpretation:  Sinus tachycardia Left anterior fascicular block Possible Anterior infarct , age undetermined Abnormal ECG SINCE LAST TRACING HEART RATE HAS INCREASED Confirmed by Orlie Dakin [phone]) on 05/20/2017 8:23:10 PM      Results for orders placed or performed during the hospital encounter of 05/20/17  Troponin I  Result Value Ref Range   Troponin I <0.03 <0.03 ng/mL  Brain natriuretic peptide  Result Value Ref Range   B Natriuretic Peptide 9.0 0.0 - 100.0 pg/mL  Basic metabolic panel  Result Value Ref Range   Sodium 135 135 - 145 mmol/L   Potassium 4.0 3.5 -  5.1 mmol/L   Chloride 98 (L) 101 - 111 mmol/L   CO2 25 22 - 32 mmol/L   Glucose, Bld 114 (H) 65 - 99 mg/dL   BUN 8 6 - 20 mg/dL   Creatinine, Ser 0.67 0.44 - 1.00 mg/dL   Calcium 8.9 8.9 - 10.3 mg/dL   GFR calc non Af Amer >60 >60 mL/min   GFR calc Af Amer >60 >60 mL/min   Anion gap 12 5 - 15  CBC  Result Value Ref Range   WBC 11.1 (H) 4.0 - 10.5 K/uL   RBC 5.11 3.87 - 5.11 MIL/uL   Hemoglobin 15.2 (H) 12.0 - 15.0 g/dL   HCT 46.5 (H) 36.0 - 46.0 %   MCV 91.0 78.0 - 100.0 fL   MCH 29.7 26.0 - 34.0 pg   MCHC 32.7 30.0 - 36.0 g/dL   RDW 14.4 11.5 - 15.5 %   Platelets 278 150 - 400 K/uL  D-dimer, quantitative (not at [facility])  Result Value Ref Range   D-Dimer, Quant <0.27 0.00 - 0.50 ug/mL-FEU   Dg Chest 2 View  Result Date: 05/20/2017 CLINICAL DATA:  Shortness of breath, chest pain. EXAM: CHEST  2 VIEW COMPARISON:  Radiographs of May 18, 2016. CT scan of July 06, 2016. FINDINGS: The heart size and mediastinal contours are within normal limits. No pneumothorax or pleural effusion is noted. Mild central  pulmonary vascular congestion is noted with possible bilateral perihilar edema. The visualized skeletal structures are unremarkable. IMPRESSION: Mild central pulmonary vascular congestion is noted with possible bilateral perihilar edema. Electronically Signed   By: Marijo Conception, M.D.   On: 05/20/2017 20:31   Radiology Dg Chest 2 View  Result Date: 05/20/2017 CLINICAL DATA:  Shortness of breath, chest pain. EXAM: CHEST  2 VIEW COMPARISON:  Radiographs of May 18, 2016. CT scan of July 06, 2016. FINDINGS: The heart size and mediastinal contours are within normal limits. No pneumothorax or pleural effusion is noted. Mild central pulmonary vascular congestion is noted with possible bilateral perihilar edema. The visualized skeletal structures are unremarkable. IMPRESSION: Mild central pulmonary vascular congestion is noted with possible bilateral perihilar edema. Electronically  Signed   By: Marijo Conception, M.D.   On: 05/20/2017 20:31    Procedures Procedures (including critical care time)  Medications Ordered in ED Medications - No data to display   Initial Impression / Assessment and Plan / ED Course  I have reviewed the triage vital signs and the nursing notes.  Pertinent labs & imaging results that were available during my care of the patient were reviewed by me and considered in my medical decision making (see chart for details).     Pretest clinical suspicion for pulmonary embolism low negative d-dimer.  Patient's BNP is low,.  No evidence of CHF X-rays reviewed by me strongly doubt acute coronary syndrome.I  Counseled patient for 5 minutes on smoking cessation suggest follow-up with Dr. Anitra Lauth .blood pressure recheck 3 weeks Final Clinical Impressions(s) / ED Diagnoses  #1 dyspnea Final diagnoses:  None  #2 tobacco abuse #3 rash 4 elevated blood pressure ED Discharge Orders    None       Orlie Dakin, MD 05/21/17 Her[place]erto [place]    Orlie Dakin, MD 05/21/17 [phone]

## 2017-05-20 NOTE — ED Notes (Signed)
Pt speaking in full sentences

## 2017-05-20 NOTE — ED Triage Notes (Signed)
Pt states she is having difficulty breathing. Has already taken at home breathing treatment as well as inhalers and is getting no relief. Pt states she has "trapped air" in her lungs. Pt states this got bad yesterday evening and had been feeling bad for a few weeks.

## 2017-05-21 ENCOUNTER — Encounter (HOSPITAL_COMMUNITY): Payer: Commercial Managed Care - PPO

## 2017-05-21 ENCOUNTER — Encounter: Payer: Commercial Managed Care - PPO | Admitting: Vascular Surgery

## 2017-05-21 MED ORDER — HYDROXYZINE HCL 25 MG PO TABS: 25.0000 mg | ORAL_TABLET | Freq: Once | ORAL | Status: DC

## 2017-05-21 MED ORDER — PREGABALIN 75 MG PO CAPS: 75.0000 mg | ORAL_CAPSULE | Freq: Once | ORAL | Status: DC

## 2017-05-21 MED ORDER — BUSPIRONE HCL 15 MG PO TABS
30.0000 mg | ORAL_TABLET | Freq: Once | ORAL | Status: DC
  Filled 2017-05-21: qty 2

## 2017-05-21 MED ORDER — LAMOTRIGINE 25 MG PO TABS: 100.0000 mg | ORAL_TABLET | Freq: Once | ORAL | Status: DC

## 2017-05-21 MED ORDER — ROPINIROLE HCL 0.25 MG PO TABS
0.7500 mg | ORAL_TABLET | Freq: Once | ORAL | Status: DC
  Filled 2017-05-21: qty 1

## 2017-05-21 MED ORDER — CLONAZEPAM 0.5 MG PO TABS: 1.0000 mg | ORAL_TABLET | Freq: Once | ORAL | Status: DC

## 2017-05-21 NOTE — Discharge Instructions (Signed)
Call Dr.McGowen's office to arrange to get your blood pressure rechecked within the next 3 weeks.  Today's was elevated at 139/99.  Ask Dr.Mcgowen to help you to stop smoking.  Today's evaluation work showed no evidence of blood clot in your lung.  No evidence of heart problems or congestive heart failure

## 2017-05-23 ENCOUNTER — Ambulatory Visit: Payer: Self-pay | Admitting: *Deleted

## 2017-05-23 ENCOUNTER — Encounter (HOSPITAL_COMMUNITY): Payer: Self-pay

## 2017-05-23 ENCOUNTER — Emergency Department (HOSPITAL_COMMUNITY)
Admission: EM | Admit: 2017-05-23 | Discharge: 2017-05-23 | Disposition: A | Discharge status: Home / Self Care | Payer: Commercial Managed Care - PPO | Attending: Emergency Medicine | Admitting: Emergency Medicine

## 2017-05-23 DIAGNOSIS — Z79899 Other long term (current) drug therapy: Secondary | ICD-10-CM | POA: Insufficient documentation

## 2017-05-23 DIAGNOSIS — I10 Essential (primary) hypertension: Secondary | ICD-10-CM | POA: Diagnosis not present

## 2017-05-23 DIAGNOSIS — J449 Chronic obstructive pulmonary disease, unspecified: Secondary | ICD-10-CM | POA: Insufficient documentation

## 2017-05-23 DIAGNOSIS — Z7982 Long term (current) use of aspirin: Secondary | ICD-10-CM | POA: Diagnosis not present

## 2017-05-23 DIAGNOSIS — E785 Hyperlipidemia, unspecified: Secondary | ICD-10-CM | POA: Diagnosis not present

## 2017-05-23 DIAGNOSIS — F17210 Nicotine dependence, cigarettes, uncomplicated: Secondary | ICD-10-CM | POA: Diagnosis not present

## 2017-05-23 DIAGNOSIS — Z91040 Latex allergy status: Secondary | ICD-10-CM | POA: Insufficient documentation

## 2017-05-23 LAB — COMPREHENSIVE METABOLIC PANEL
ALT: 21 U/L (ref 14–54)
AST: 21 U/L (ref 15–41)
Albumin: 3.8 g/dL (ref 3.5–5.0)
Alkaline Phosphatase: 59 U/L (ref 38–126)
Anion gap: 11 (ref 5–15)
BUN: 9 mg/dL (ref 6–20)
CO2: 24 mmol/L (ref 22–32)
Calcium: 9.1 mg/dL (ref 8.9–10.3)
Chloride: 101 mmol/L (ref 101–111)
Creatinine, Ser: 0.65 mg/dL (ref 0.44–1.00)
GFR calc Af Amer: >60 mL/min (ref >60)
GFR calc non Af Amer: >60 mL/min (ref >60)
Glucose, Bld: 105 mg/dL — ABNORMAL HIGH (ref 65–99)
Potassium: 3.9 mmol/L (ref 3.5–5.1)
Sodium: 136 mmol/L (ref 135–145)
Total Bilirubin: 0.5 mg/dL (ref 0.3–1.2)
Total Protein: 6.8 g/dL (ref 6.5–8.1)

## 2017-05-23 LAB — CBC WITH DIFFERENTIAL/PLATELET
Basophils Absolute: 0 10*3/uL (ref 0.0–0.1)
Basophils Relative: 0 %
Eosinophils Absolute: 0.2 10*3/uL (ref 0.0–0.7)
Eosinophils Relative: 2 %
HCT: 45.7 % (ref 36.0–46.0)
Hemoglobin: 15 g/dL (ref 12.0–15.0)
Lymphocytes Relative: 24 %
Lymphs Abs: 2.4 10*3/uL (ref 0.7–4.0)
MCH: 29.3 pg (ref 26.0–34.0)
MCHC: 32.8 g/dL (ref 30.0–36.0)
MCV: 89.3 fL (ref 78.0–100.0)
Monocytes Absolute: 0.4 10*3/uL (ref 0.1–1.0)
Monocytes Relative: 4 %
Neutro Abs: 7 10*3/uL (ref 1.7–7.7)
Neutrophils Relative %: 70 %
Platelets: 259 10*3/uL (ref 150–400)
RBC: 5.12 MIL/uL — ABNORMAL HIGH (ref 3.87–5.11)
RDW: 14.6 % (ref 11.5–15.5)
WBC: 9.9 10*3/uL (ref 4.0–10.5)

## 2017-05-23 MED ORDER — METOPROLOL TARTRATE 25 MG PO TABS: 25.0000 mg | ORAL_TABLET | Freq: Two times a day (BID) | ORAL | 0 refills | Status: DC

## 2017-05-23 MED ORDER — ONDANSETRON HCL 4 MG/2ML IJ SOLN
INTRAMUSCULAR | Status: AC
Start: 2017-05-23 — End: 2017-05-23
  Administered 2017-05-23: 4 mg via INTRAVENOUS
  Filled 2017-05-23: qty 2

## 2017-05-23 MED ORDER — METOPROLOL TARTRATE 25 MG PO TABS: 50.0000 mg | ORAL_TABLET | Freq: Two times a day (BID) | ORAL | 0 refills | Status: DC

## 2017-05-23 MED ORDER — METOPROLOL TARTRATE 5 MG/5ML IV SOLN
5.0000 mg | Freq: Once | INTRAVENOUS | Status: AC
  Administered 2017-05-23: 5 mg via INTRAVENOUS
  Filled 2017-05-23: qty 5

## 2017-05-23 MED ORDER — ONDANSETRON HCL 4 MG/2ML IJ SOLN
4.0000 mg | Freq: Once | INTRAMUSCULAR | Status: AC
  Administered 2017-05-23: 4 mg via INTRAVENOUS

## 2017-05-23 NOTE — Telephone Encounter (Signed)
Patient states she was seen at her phychiatric appointment this morning and her blood pressure were elevated. She states she had confusion this morning and she has had trouble breathing for some time. Advised patient due to her elevated BP numbers and symptoms she has been having she needs to be seen at ED for evaluation.She agrees to go.    Reason for Disposition . [3] Systolic BP  >= 299 OR Diastolic >= 242 AND [6] cardiac or neurologic symptoms (e.g., chest pain, difficulty breathing, unsteady gait, blurred vision)  Answer Assessment - Initial Assessment Questions 1. BLOOD PRESSURE: "What is the blood pressure?" "Did you take at least two measurements 5 minutes apart?"     194/111, 196/102, 196/110 2. ONSET: "When did you take your blood pressure?"     This morning 3. HOW: "How did you obtain the blood pressure?" (e.g., visiting nurse, automatic home BP monitor)     Both- automatic- manual 4. HISTORY: "Do you have a history of high blood pressure?"     yes 5. MEDICATIONS: "Are you taking any medications for blood pressure?" "Have you missed any doses recently?"     Off of medications 6. OTHER SYMPTOMS: "Do you have any symptoms?" (e.g., headache, chest pain, blurred vision, difficulty breathing, weakness)     Confusion, trouble breathing 7. PREGNANCY: "Is there any chance you are pregnant?" "When was your last menstrual period?"     n/a  Protocols used: HIGH BLOOD PRESSURE-A-AH

## 2017-05-23 NOTE — Discharge Instructions (Signed)
Follow up with your md next week. °

## 2017-05-23 NOTE — ED Provider Notes (Signed)
w. [facility] EMERGENCY DEPARTMENT Provider Note   CSN: [id number] Arrival date & time: 05/23/17  1144     History   Chief Complaint Chief Complaint  Patient presents with  . Hypertension    HPI Martha Espinoza is a 52 y.o. female.  Patient complains of high blood pressure.  She was at her counselor's office and her blood pressure was very high.   The history is provided by the patient.  Hypertension  This is a recurrent problem. The current episode started more than 2 days ago. The problem occurs constantly. The problem has not changed since onset.Pertinent negatives include no chest pain, no abdominal pain and no headaches. Nothing aggravates the symptoms. Nothing relieves the symptoms.    Past Medical History:  Diagnosis Date  . Anxiety   . Arthritis   . Bipolar 1 disorder ([place])   . Bronchitis, mucopurulent recurrent ([place])    PFTs 04/2016 showed no sign of copd or asthma, plus she actually had worse FEV1 after albuterol.  . Chronic pain syndrome    chronic low back pain (Pain mgmt= Dr. [name]).  MRI showed no signif disc dz, only showed some facet arthropathy at L4-5, with joint diastasis at L4-5--plan as of 11/07/16 neurosurg eval is bilat facet injections.  . Colitis due to Clostridium difficile 2001  . GERD (gastroesophageal reflux disease)   . Hepatic hemangioma   . Hepatic steatosis 06/2016   Noted incidentally on CT chest  . History of Salmonella gastroenteritis   . Hyperlipidemia    a. Noted 02/2014.  Marland Kitchen Hypertension    HCTZ started 07/2015  . IBS (irritable bowel syndrome)   . Iron deficiency anemia 05/2014   per pt it is not due to vaginal blood loss; hemoccults sent to pt in mail 413/16.  . Lumbar spondylosis    mild, mainly focused at L4-5 and L5-S1.  . Microscopic hematuria    Intermittent (no w/u done yet, as of 03/2014)  . Migraine headache   . Morbid obesity ([place])   . Multiple lipomas 09/2016   left upper arm--very small ones.  . Peripheral edema  Fall 2018   R>L, non-pitting for the most part--venous doppler neg for DVT 01/2017.  Saw Dr. Bridgett Larsson (Vasc) 04/12/17 and u/s showed some venous reflux dz but he felt her sx's in legs were NOT due to her venous insufficiency OR PAD.  Thigh high comp stockings rx'd.  . Polycystic ovarian syndrome   . Pruritic dermatitis 2015/2016   +Scabies prep at [facility] 07/2014. (Primarily pruritic skin, but eventually a subtle rash as well)  . Rectal cancer ([place])    a. Followed by Dr. Gala Romney, dx 2000-2001. Tumor removed from rectal.  . Recurrent sinusitis    +allergic rhinitis  . Right shoulder pain 2017   Murphy/Wainer: RC bursitis + RC tear (MRI)--injection trial is plan per pt report 05/04/16.  . Tobacco dependence   . Vitamin D deficiency     Patient Active Problem List   Diagnosis Date Noted  . Chronic venous insufficiency 04/12/2017  . Acute respiratory failure with hypoxia ([place]) 10/18/2015  . COPD exacerbation ([place]) 10/18/2015  . Drug reaction 07/23/2015  . Fever of unknown origin 07/22/2015  . Lactic acidosis 07/22/2015  . Hypokalemia 07/22/2015  . Nausea and vomiting 07/22/2015  . Chronic back pain 07/22/2015  . Abdominal pain 07/26/2014  . Nausea without vomiting 07/26/2014  . History of colon cancer 07/26/2014  . Iron deficiency anemia 07/07/2014  . Vitamin D  deficiency 07/07/2014  . Abnormal nuclear stress test 03/31/2014  . Abnormal cardiac CT angiography 03/31/2014  . Hyperlipidemia 03/24/2014  . Morbid obesity ([place])   . Chest pain of uncertain etiology 67/59/1638  . COPD (chronic obstructive pulmonary disease) ([place]) 03/23/2014  . Leukocytosis 03/23/2014  . Hyperglycemia 03/23/2014  . Bipolar 1 disorder ([place]) 03/23/2014  . Pruritic rash 03/23/2014  . Chronic anxiety 03/23/2014  . Tobacco abuse 03/23/2014  . Intractable migraine without aura and with status migrainosus 01/14/2014  . Sinusitis 07/02/2013  . Urinary frequency 07/02/2013  . Migraine 07/02/2013  .  Anxiety state, unspecified 04/14/2013  . Obesity 04/14/2013  . Anemia 04/14/2013  . CONSTIPATION 10/12/2009  . CARCINOMA IN SITU OF RECTUM 08/25/2009  . GERD 08/25/2009  . IRRITABLE BOWEL SYNDROME 08/25/2009  . DIARRHEA, CHRONIC 08/25/2009  . HEMANGIOMA, HEPATIC 08/24/2009  . POLYCYSTIC OVARIAN DISEASE 08/24/2009  . Personal history of other diseases of digestive system 08/24/2009    Past Surgical History:  Procedure Laterality Date  . CARDIOVASCULAR STRESS TEST  03/24/14   Lexscan MIBI: mild anterior ischemia?  Cardiac CT angiogram recommended/done.  . CHOLECYSTECTOMY    . COLONOSCOPY  05/05/2007   adenoma  . coronary CT angio  03/29/14   Two vessel dz/moderate stenosis of mid LAD and proximal RCA; cath recommended.  Marland Kitchen DILATION AND CURETTAGE OF UTERUS     for vaginal bleeding  . JOINT REPLACEMENT    . LEFT HEART CATHETERIZATION WITH CORONARY ANGIOGRAM N/A 03/31/2014   Normal coronaries, EF 46-65%, diastolic dysfunction.  Procedure: LEFT HEART CATHETERIZATION WITH CORONARY ANGIOGRAM;  Surgeon: Sinclair Grooms, MD;  Location: [facility] CATH LAB;  Service: Cardiovascular;  Laterality: N/A;  . LOWER EXT VENOUS DOPPLERS Bilateral 02/08/2017   NEG for DVT (bilat)  . PFTs  04/2016   No sign of COPD or asthma; FEV 1 worse after albut.  . Situ removed  age 70   High-grade rectal adenoma removed from rectum   . TRANSTHORACIC ECHOCARDIOGRAM  03/23/14   Normal    OB History    Gravida Para Term Preterm AB Living   4 1 1   3 1    SAB TAB Ectopic Multiple Live Births   3               Home Medications    Prior to Admission medications   Medication Sig Start Date End Date Taking? Authorizing Provider  aspirin 81 MG chewable tablet Chew 1 tablet (81 mg total) by mouth daily. 09/06/12   Ripley Fraise, MD  busPIRone (BUSPAR) 15 MG tablet 1 in the morning and 2 at bedtime    [provider]  cetirizine (ZYRTEC) 10 MG tablet TAKE ONE TABLET BY MOUTH ONCE DAILY 01/21/17   McGowen,  Adrian Blackwater, MD  clonazePAM (KLONOPIN) 0.5 MG tablet TAKE 1 TABLET BY MOUTH ONCE DAILY DURING THE DAY AS NEEDED, THEN TAKE 2 TABLETS AT BEDTIME. 12/19/16   McGowen, Adrian Blackwater, MD  cyclobenzaprine (FLEXERIL) 10 MG tablet TAKE ONE TABLET BY MOUTH EVERY 6 HOURS AS NEEDED (SPASM RELATED TO HEADACHES) 07/10/16   McGowen, Adrian Blackwater, MD  diphenhydrAMINE (BENADRYL) 25 MG tablet Take 1 tablet (25 mg total) by mouth every 6 (six) hours. Patient taking differently: Take 25-50 mg by mouth every 6 (six) hours as needed for itching or allergies.  07/19/15   Fredia Sorrow, MD  DULoxetine (CYMBALTA) 60 MG capsule Take 60 mg by mouth daily.    [provider]  fluticasone (FLONASE) 50 MCG/ACT nasal  spray Place 2 sprays into both nostrils daily. 08/24/14   Ward, Delice Bison, DO  hydrOXYzine (ATARAX/VISTARIL) 25 MG tablet Take 25 mg by mouth 3 (three) times daily.     [provider]  IRON-FOLIC ACID PO Take 65 mg by mouth daily.     [provider]  lamoTRIgine (LAMICTAL) 200 MG tablet Take 200 mg by mouth at bedtime.    [provider]  metoprolol tartrate (LOPRESSOR) 25 MG tablet Take 2 tablets (50 mg total) by mouth 2 (two) times daily. 05/23/17   Milton Ferguson, MD  Naldemedine Tosylate (SYMPROIC) 0.2 MG TABS Take 1 tablet by mouth daily.    [provider]  nitroGLYCERIN (NITROSTAT) 0.4 MG SL tablet Place 1 tablet (0.4 mg total) under the tongue every 5 (five) minutes as needed for chest pain. 05/04/16   McGowen, Adrian Blackwater, MD  oxyCODONE-acetaminophen (PERCOCET) 7.5-325 MG tablet Take 1 tablet by mouth every 8 (eight) hours as needed for severe pain.    [provider]  pantoprazole (PROTONIX) 40 MG tablet TAKE ONE TABLET BY MOUTH ONCE DAILY 08/28/16   McGowen, Adrian Blackwater, MD  pregabalin (LYRICA) 75 MG capsule Take 75 mg by mouth 3 (three) times daily.     [provider]  promethazine (PHENERGAN) 25 MG tablet TAKE 1 TABLET BY MOUTH EVERY 6 HOURS AS NEEDED FOR NAUSEA  AND VOMITING 03/27/17   McGowen, Adrian Blackwater, MD  rOPINIRole (REQUIP) 0.25 MG tablet TAKE 3 TABLETS BY MOUTH ONCE DAILY AT BEDTIME 02/21/17   McGowen, Adrian Blackwater, MD  citalopram (CELEXA) 40 MG tablet Take 20 mg by mouth at bedtime.   06/18/11  [provider]  omeprazole (PRILOSEC) 20 MG capsule Take 40 mg by mouth daily.  06/18/11  [provider]    Family History Family History  Problem Relation Age of Onset  . Colon cancer Father 43  . Cancer Father   . Diverticulitis Mother   . Heart disease Mother   . Hypertension Mother   . Hyperlipidemia Mother   . Mental illness Mother   . Diabetes Mother   . Heart attack Mother   . Stroke Neg Hx     Social History Social History   Tobacco Use  . Smoking status: Current Every Day Smoker    Packs/day: 0.50    Years: 18.00    Pack years: 9.00    Types: Cigarettes  . Smokeless tobacco: Never Used  . Tobacco comment: 25 years  Substance Use Topics  . Alcohol use: No    Alcohol/week: 0.0 oz  . Drug use: No     Allergies   Contrast media [iodinated diagnostic agents]; Shellfish allergy; Codeine; Furosemide; Ivermectin; Betadine [povidone iodine]; Latex; Lipitor [atorvastatin]; Permethrin; and Tape   Review of Systems Review of Systems  Constitutional: Negative for appetite change and fatigue.  HENT: Negative for congestion, ear discharge and sinus pressure.   Eyes: Negative for discharge.  Respiratory: Negative for cough.   Cardiovascular: Negative for chest pain.  Gastrointestinal: Negative for abdominal pain and diarrhea.  Genitourinary: Negative for frequency and hematuria.  Musculoskeletal: Negative for back pain.  Skin: Negative for rash.  Neurological: Negative for seizures and headaches.  Psychiatric/Behavioral: Negative for hallucinations.     Physical Exam Updated Vital Signs BP (!) 143/90   Pulse 100   Temp 99.8 F (37.7 C) (Oral)   Resp 15   Ht 5\' 1"  (1.549 m)   Wt 117.9 kg (260 lb)   SpO2 96%  BMI 49.13 kg/m   Physical Exam  Constitutional: She is oriented to person, place, and time. She appears well-developed.  HENT:  Head: Normocephalic.  Eyes: Conjunctivae and EOM are normal. No scleral icterus.  Neck: Neck supple. No thyromegaly present.  Cardiovascular: Normal rate and regular rhythm. Exam reveals no gallop and no friction rub.  No murmur heard. Pulmonary/Chest: No stridor. She has no wheezes. She has no rales. She exhibits no tenderness.  Abdominal: She exhibits no distension. There is no tenderness. There is no rebound.  Musculoskeletal: Normal range of motion. She exhibits no edema.  Lymphadenopathy:    She has no cervical adenopathy.  Neurological: She is oriented to person, place, and time. She exhibits normal muscle tone. Coordination normal.  Skin: No rash noted. No erythema.  Psychiatric: She has a normal mood and affect. Her behavior is normal.     ED Treatments / Results  Labs (all labs ordered are listed, but only abnormal results are displayed) Labs Reviewed  CBC WITH DIFFERENTIAL/PLATELET - Abnormal; Notable for the following components:      Result Value   RBC 5.12 (*)    All other components within normal limits  COMPREHENSIVE METABOLIC PANEL - Abnormal; Notable for the following components:   Glucose, Bld 105 (*)    All other components within normal limits    EKG  EKG Interpretation  Date/Time:  Thursday May 23 2017 12:32:55 EST Ventricular Rate:  101 PR Interval:    QRS Duration: 91 QT Interval:  354 QTC Calculation: 459 R Axis:   32 Text Interpretation:  Sinus tachycardia Low voltage, precordial leads Consider anterior infarct Confirmed by Tanna Furry [phone]) on 05/23/2017 12:54:46 PM       Radiology No results found.  Procedures Procedures (including critical care time)  Medications Ordered in ED Medications  ondansetron (ZOFRAN) injection 4 mg (not administered)  ondansetron (ZOFRAN) 4 MG/2ML injection (not  administered)  metoprolol tartrate (LOPRESSOR) injection 5 mg (5 mg Intravenous Given 05/23/17 1342)     Initial Impression / Assessment and Plan / ED Course  I have reviewed the triage vital signs and the nursing notes.  Pertinent labs & imaging results that were available during my care of the patient were reviewed by me and considered in my medical decision making (see chart for details).    Labs including CBC and chemistries unremarkable.  Chest x-ray unremarkable EKG no acute changes.  Patient has hypertension will be treated with metoprolol 25 mg twice a day.  She will follow-up with her PCP  Final Clinical Impressions(s) / ED Diagnoses   Final diagnoses:  Essential hypertension    ED Discharge Orders        Ordered    metoprolol tartrate (LOPRESSOR) 25 MG tablet  2 times daily     05/23/17 1443       Milton Ferguson, MD 05/23/17 1449

## 2017-05-23 NOTE — ED Triage Notes (Signed)
Pt states elevated BP's 194/111 and 196/102 at [facility] and was told to come to the ED.

## 2017-05-24 NOTE — Telephone Encounter (Signed)
Noted/agree.  Signed:  Crissie Sickles, MD           05/24/2017

## 2017-05-27 ENCOUNTER — Ambulatory Visit: Payer: Self-pay | Admitting: *Deleted

## 2017-05-27 NOTE — Telephone Encounter (Signed)
Patient is concerned that she is dehydrated and can't get her hydration up so she can eat solids. Patient has also started BP pressure medication- for elevated BP. Patient states she has been talking in her sleep at night. Patient only wants to see her PCP- appointment made. Patient advised to go to ED if she gets to where she can not tolerate fluids.  Reason for Disposition . Nausea persists > 1 week  Answer Assessment - Initial Assessment Questions 1. NAUSEA SEVERITY: "How bad is the nausea?" (e.g., mild, moderate, severe; dehydration, weight loss)   - MILD: loss of appetite without change in eating habits   - MODERATE: decreased oral intake without significant weight loss, dehydration, or malnutrition   - SEVERE: inadequate caloric or fluid intake, significant weight loss, symptoms of dehydration     Patient has no appetite- she is drinking- but not able to eat solids. 2. ONSET: "When did the nausea begin?"     Couple weeks 3. VOMITING: "Any vomiting?" If so, ask: "How many times today?"     Yes- at night only 4. RECURRENT SYMPTOM: "Have you had nausea before?" If so, ask: "When was the last time?" "What happened that time?"     Yes- patient has dehydration problem- but she is having problems getting back to her normal 5. CAUSE: "What do you think is causing the nausea?"     Patient thinks she is dehydrated 6. PREGNANCY: "Is there any chance you are pregnant?" (e.g., unprotected intercourse, missed birth control pill, broken condom)     n/a  Protocols used: NAUSEA-A-AH

## 2017-05-29 ENCOUNTER — Telehealth: Payer: Self-pay | Admitting: Family Medicine

## 2017-05-29 ENCOUNTER — Other Ambulatory Visit: Payer: Self-pay

## 2017-05-29 ENCOUNTER — Encounter (HOSPITAL_COMMUNITY): Payer: Self-pay | Admitting: Emergency Medicine

## 2017-05-29 ENCOUNTER — Emergency Department (HOSPITAL_COMMUNITY)
Admission: EM | Admit: 2017-05-29 | Discharge: 2017-05-30 | Disposition: A | Discharge status: Home / Self Care | Payer: Commercial Managed Care - PPO | Attending: Emergency Medicine | Admitting: Emergency Medicine

## 2017-05-29 ENCOUNTER — Encounter: Payer: Self-pay | Admitting: Family Medicine

## 2017-05-29 ENCOUNTER — Ambulatory Visit: Payer: Commercial Managed Care - PPO | Admitting: Family Medicine

## 2017-05-29 VITALS — BP 144/100 | HR 105 | Temp 98.8°F | Resp 16 | Wt 259.0 lb

## 2017-05-29 DIAGNOSIS — Z9049 Acquired absence of other specified parts of digestive tract: Secondary | ICD-10-CM | POA: Insufficient documentation

## 2017-05-29 DIAGNOSIS — Z91040 Latex allergy status: Secondary | ICD-10-CM | POA: Insufficient documentation

## 2017-05-29 DIAGNOSIS — I10 Essential (primary) hypertension: Secondary | ICD-10-CM | POA: Insufficient documentation

## 2017-05-29 DIAGNOSIS — R112 Nausea with vomiting, unspecified: Secondary | ICD-10-CM | POA: Diagnosis not present

## 2017-05-29 DIAGNOSIS — J0100 Acute maxillary sinusitis, unspecified: Secondary | ICD-10-CM

## 2017-05-29 DIAGNOSIS — I951 Orthostatic hypotension: Secondary | ICD-10-CM | POA: Diagnosis not present

## 2017-05-29 DIAGNOSIS — E860 Dehydration: Secondary | ICD-10-CM | POA: Diagnosis not present

## 2017-05-29 DIAGNOSIS — Z85038 Personal history of other malignant neoplasm of large intestine: Secondary | ICD-10-CM | POA: Diagnosis not present

## 2017-05-29 DIAGNOSIS — J449 Chronic obstructive pulmonary disease, unspecified: Secondary | ICD-10-CM | POA: Insufficient documentation

## 2017-05-29 DIAGNOSIS — F17210 Nicotine dependence, cigarettes, uncomplicated: Secondary | ICD-10-CM | POA: Diagnosis not present

## 2017-05-29 DIAGNOSIS — F419 Anxiety disorder, unspecified: Secondary | ICD-10-CM | POA: Diagnosis not present

## 2017-05-29 DIAGNOSIS — F319 Bipolar disorder, unspecified: Secondary | ICD-10-CM | POA: Diagnosis not present

## 2017-05-29 DIAGNOSIS — R1110 Vomiting, unspecified: Secondary | ICD-10-CM | POA: Diagnosis present

## 2017-05-29 LAB — CBC
HCT: 49.8 % — ABNORMAL HIGH (ref 36.0–46.0)
Hemoglobin: 16.6 g/dL — ABNORMAL HIGH (ref 12.0–15.0)
MCH: 29.8 pg (ref 26.0–34.0)
MCHC: 33.3 g/dL (ref 30.0–36.0)
MCV: 89.4 fL (ref 78.0–100.0)
Platelets: 298 10*3/uL (ref 150–400)
RBC: 5.57 MIL/uL — ABNORMAL HIGH (ref 3.87–5.11)
RDW: 14.1 % (ref 11.5–15.5)
WBC: 9.4 10*3/uL (ref 4.0–10.5)

## 2017-05-29 LAB — URINALYSIS, ROUTINE W REFLEX MICROSCOPIC
Bacteria, UA: NONE SEEN
Bilirubin Urine: NEGATIVE
Glucose, UA: NEGATIVE mg/dL
Hgb urine dipstick: NEGATIVE
Ketones, ur: NEGATIVE mg/dL
Nitrite: NEGATIVE
Protein, ur: NEGATIVE mg/dL
Specific Gravity, Urine: 1.02 (ref 1.005–1.030)
pH: 6 (ref 5.0–8.0)

## 2017-05-29 LAB — PREGNANCY, URINE: Preg Test, Ur: NEGATIVE

## 2017-05-29 LAB — COMPREHENSIVE METABOLIC PANEL
ALT: 17 U/L (ref 14–54)
AST: 21 U/L (ref 15–41)
Albumin: 4.2 g/dL (ref 3.5–5.0)
Alkaline Phosphatase: 71 U/L (ref 38–126)
Anion gap: 13 (ref 5–15)
BUN: 11 mg/dL (ref 6–20)
CO2: 22 mmol/L (ref 22–32)
Calcium: 9.6 mg/dL (ref 8.9–10.3)
Chloride: 101 mmol/L (ref 101–111)
Creatinine, Ser: 0.71 mg/dL (ref 0.44–1.00)
GFR calc Af Amer: >60 mL/min (ref >60)
GFR calc non Af Amer: >60 mL/min (ref >60)
Glucose, Bld: 114 mg/dL — ABNORMAL HIGH (ref 65–99)
Potassium: 4.2 mmol/L (ref 3.5–5.1)
Sodium: 136 mmol/L (ref 135–145)
Total Bilirubin: 0.9 mg/dL (ref 0.3–1.2)
Total Protein: 7.7 g/dL (ref 6.5–8.1)

## 2017-05-29 LAB — LIPASE, BLOOD: Lipase: 24 U/L (ref 11–51)

## 2017-05-29 MED ORDER — SODIUM CHLORIDE 0.9 % IV BOLUS (SEPSIS)
1000.0000 mL | Freq: Once | INTRAVENOUS | Status: AC
  Administered 2017-05-30: 1000 mL via INTRAVENOUS

## 2017-05-29 MED ORDER — NYSTATIN 100000 UNIT/ML MT SUSP: OROMUCOSAL | 0 refills | Status: DC

## 2017-05-29 MED ORDER — PROMETHAZINE HCL 25 MG/ML IJ SOLN
12.5000 mg | Freq: Once | INTRAMUSCULAR | Status: AC
  Administered 2017-05-30: 12.5 mg via INTRAVENOUS
  Filled 2017-05-29: qty 1

## 2017-05-29 MED ORDER — AMOXICILLIN 875 MG PO TABS: 875.0000 mg | ORAL_TABLET | Freq: Two times a day (BID) | ORAL | 0 refills | Status: AC

## 2017-05-29 MED ORDER — PROMETHAZINE HCL 25 MG PO TABS: ORAL_TABLET | ORAL | 3 refills | Status: DC

## 2017-05-29 NOTE — Telephone Encounter (Signed)
SW Dr. Anitra Lauth, he stated that he will send in an antibiotic for pt.   Pt advised and voiced understanding. She requested amoxicillin, stated that its the only thing she can take for this.

## 2017-05-29 NOTE — Progress Notes (Signed)
OFFICE VISIT  05/29/2017   CC:  Chief Complaint  Patient presents with  . Nausea   HPI:    Patient is a 52 y.o. Caucasian female who presents accompanied by a female friend for nausea. Pt describes nausea, can't even tolerate the thought of drinking water or eating food. Onset about 3 weeks ago when she started getting "sinus" symptoms, ST, cough, fever.  Gradually started getting nausea, not wanting to eat, able to drink gatorade initially.  Some abd discomfort, seems to be umbilical region mostly, having some regular BMs lately, "slick" coming out and has been taking symproic for narcotic-induced constipation.  No actual pus in stool, no diarrhea.   She is taking ranitidine and pantoprazole daily. Very tired.  Temp around 100 for the last week or so. ST resolved, has some bilat facial sinus congestion, eyes runny/swelling below eyes.  Cough is better.  No SoB. Urine was "orangish".  Denies dysuria, urinary urgency.  Volume of urine she makes is signif diminished. She reiterates a few times during o/v today that "I've had so much on my plate lately", lots of anxiety--acute on chronic. Has hx of having worsening of her chronic physical sx's/ailments when she gets overstressed.  Of note, she was sent to ED 05/23/17 from her psychiatrist's office for elevated bp, labs, CXR, and EKG unremarkable and pt was started on metoprolol 25mg  bid.  BP was 143/90 in ED that day.  Past Medical History:  Diagnosis Date  . Anxiety   . Arthritis   . Bipolar 1 disorder ([place])   . Bronchitis, mucopurulent recurrent ([place])    PFTs 04/2016 showed no sign of copd or asthma, plus she actually had worse FEV1 after albuterol.  . Chronic pain syndrome    chronic low back pain (Pain mgmt= Dr. [name]).  MRI showed no signif disc dz, only showed some facet arthropathy at L4-5, with joint diastasis at L4-5--plan as of 11/07/16 neurosurg eval is bilat facet injections.  . Colitis due to Clostridium difficile 2001  . GERD  (gastroesophageal reflux disease)   . Hepatic hemangioma   . Hepatic steatosis 06/2016   Noted incidentally on CT chest  . History of Salmonella gastroenteritis   . Hyperlipidemia    a. Noted 02/2014.  Marland Kitchen Hypertension    HCTZ started 07/2015  . IBS (irritable bowel syndrome)   . Iron deficiency anemia 05/2014   per pt it is not due to vaginal blood loss; hemoccults sent to pt in mail 413/16.  . Lumbar spondylosis    mild, mainly focused at L4-5 and L5-S1.  . Microscopic hematuria    Intermittent (no w/u done yet, as of 03/2014)  . Migraine headache   . Morbid obesity ([place])   . Multiple lipomas 09/2016   left upper arm--very small ones.  . Peripheral edema Fall 2018   R>L, non-pitting for the most part--venous doppler neg for DVT 01/2017.  Saw Dr. Bridgett Larsson (Vasc) 04/12/17 and u/s showed some venous reflux dz but he felt her sx's in legs were NOT due to her venous insufficiency OR PAD.  Thigh high comp stockings rx'd.  . Polycystic ovarian syndrome   . Pruritic dermatitis 2015/2016   +Sc[facility]ies prep at [facility] 07/2014. (Primarily pruritic skin, but eventually a subtle rash as well)  . Rectal cancer ([place])    a. Followed by Dr. Gala Romney, dx 2000-2001. Tumor removed from rectal.  . Recurrent sinusitis    +allergic rhinitis  . Right shoulder pain 2017   Murphy/Wainer:  RC bursitis + RC tear (MRI)--injection trial is plan per pt report 05/04/16.  . Tobacco dependence   . Vitamin D deficiency     Past Surgical History:  Procedure Laterality Date  . CARDIOVASCULAR STRESS TEST  03/24/14   Lexscan MIBI: mild anterior ischemia?  Cardiac CT angiogram recommended/done.  . CHOLECYSTECTOMY    . COLONOSCOPY  05/05/2007   adenoma  . coronary CT angio  03/29/14   Two vessel dz/moderate stenosis of mid LAD and proximal RCA; cath recommended.  Marland Kitchen DILATION AND CURETTAGE OF UTERUS     for vaginal bleeding  . JOINT REPLACEMENT    . LEFT HEART CATHETERIZATION WITH CORONARY ANGIOGRAM N/A 03/31/2014    Normal coronaries, EF 01-75%, diastolic dysfunction.  Procedure: LEFT HEART CATHETERIZATION WITH CORONARY ANGIOGRAM;  Surgeon: Sinclair Grooms, MD;  Location: [facility] CATH LAB;  Service: Cardiovascular;  Laterality: N/A;  . LOWER EXT VENOUS DOPPLERS Bilateral 02/08/2017   NEG for DVT (bilat)  . PFTs  04/2016   No sign of COPD or asthma; FEV 1 worse after albut.  . Situ removed  age 73   High-grade rectal adenoma removed from rectum   . TRANSTHORACIC ECHOCARDIOGRAM  03/23/14   Normal    Outpatient Medications Prior to Visit  Medication Sig Dispense Refill  . albuterol (PROVENTIL HFA) 108 (90 Base) MCG/ACT inhaler as needed    . aspirin 81 MG chewable tablet Chew 1 tablet (81 mg total) by mouth daily. 14 tablet 0  . busPIRone (BUSPAR) 15 MG tablet 1 in the morning and 2 at bedtime    . cetirizine (ZYRTEC) 10 MG tablet TAKE ONE TABLET BY MOUTH ONCE DAILY 30 tablet 11  . clonazePAM (KLONOPIN) 0.5 MG tablet TAKE 1 TABLET BY MOUTH ONCE DAILY DURING THE DAY AS NEEDED, THEN TAKE 2 TABLETS AT BEDTIME. 90 tablet 5  . cyclobenzaprine (FLEXERIL) 10 MG tablet TAKE ONE TABLET BY MOUTH EVERY 6 HOURS AS NEEDED (SPASM RELATED TO HEADACHES) 42 tablet 5  . diphenhydrAMINE (BENADRYL) 25 MG tablet Take 1 tablet (25 mg total) by mouth every 6 (six) hours. (Patient taking differently: Take 25-50 mg by mouth every 6 (six) hours as needed for itching or allergies. ) 20 tablet 0  . fluticasone (FLONASE) 50 MCG/ACT nasal spray Place 2 sprays into both nostrils daily. 16 g 1  . hydrOXYzine (ATARAX/VISTARIL) 25 MG tablet Take 25 mg by mouth 3 (three) times daily.     Marland Kitchen IRON-FOLIC ACID PO Take 65 mg by mouth daily.     . metoprolol tartrate (LOPRESSOR) 25 MG tablet Take 2 tablets (50 mg total) by mouth 2 (two) times daily. 60 tablet 0  . metoprolol tartrate (LOPRESSOR) 25 MG tablet Take 1 tablet (25 mg total) by mouth 2 (two) times daily. 60 tablet 0  . Naldemedine Tosylate (SYMPROIC) 0.2 MG TABS     . nitroGLYCERIN  (NITROSTAT) 0.4 MG SL tablet Place 1 tablet (0.4 mg total) under the tongue every 5 (five) minutes as needed for chest pain. 20 tablet 0  . oxyCODONE-acetaminophen (PERCOCET) 10-325 MG tablet Take by mouth.    . pantoprazole (PROTONIX) 40 MG tablet TAKE ONE TABLET BY MOUTH ONCE DAILY 90 tablet 3  . potassium chloride (MICRO-K) 10 MEQ CR capsule     . pregabalin (LYRICA) 75 MG capsule Take 75 mg by mouth 3 (three) times daily.     Marland Kitchen rOPINIRole (REQUIP) 0.25 MG tablet TAKE 3 TABLETS BY MOUTH ONCE DAILY AT BEDTIME 90 tablet 6  .  promethazine (PHENERGAN) 25 MG tablet TAKE 1 TABLET BY MOUTH EVERY 6 HOURS AS NEEDED FOR NAUSEA AND VOMITING 30 tablet 3  . DULoxetine (CYMBALTA) 60 MG capsule Take 60 mg by mouth daily.    Marland Kitchen lamoTRIgine (LAMICTAL) 200 MG tablet Take 200 mg by mouth at bedtime.    Melynda Ripple Tosylate (SYMPROIC) 0.2 MG TABS Take 1 tablet by mouth daily.    Marland Kitchen oxyCODONE-acetaminophen (PERCOCET) 7.5-325 MG tablet Take 1 tablet by mouth every 8 (eight) hours as needed for severe pain.     No facility-administered medications prior to visit.     Allergies  Allergen Reactions  . Contrast Media [Iodinated Diagnostic Agents] Anaphylaxis    Needs 13-hour prep  . Shellfish Allergy Anaphylaxis  . Codeine Nausea And Vomiting and Other (See Comments)    Hallucinations, Also sees people that are not there   . Furosemide Rash    Rash, soB.  . Ivermectin Nausea And Vomiting    N/V and rash  . Betadine [Povidone Iodine] Rash  . Latex Hives  . Lipitor [Atorvastatin] Itching  . Permethrin Itching  . Tape Dermatitis    Paper tape and clear    ROS As per HPI  PE: Blood pressure (!) 144/100, pulse (!) 105, temperature 98.8 F (37.1 C), temperature source Oral, resp. rate 16, weight 259 lb (117.5 kg), last menstrual period 05/23/2017, SpO2 94 %. Body mass index is 48.94 kg/m.  Gen: alert, tired appearing but NAD. AFFECT: anxious/mildy depressed, lucid thought and speech. VS:  noted--normal. Gen: alert, NAD, NONTOXIC APPEARING. HEENT: eyes without injection, drainage, or swelling.  Ears: EACs clear, TMs with normal light reflex and landmarks.  Nose: Clear rhinorrhea, with some dried, crusty exudate adherent to mildly injected mucosa.  No purulent d/c.  Bilat paranasal sinus TTP.  No facial swelling.  Throat and mouth without focal lesion.  No pharyngial swelling, erythema, or exudate.   Neck: supple, no LAD.   LUNGS: CTA bilat, nonlabored resps.   CV: Regular rhythm, tachy to 105, no m/r/g. ABD: soft, NT/ND (rotund), BS decreased.  No high pitched BS.  No bruit or mass. EXT: no c/c/e SKIN: no rash.  No jaundice.   LABS:    Chemistry      Component Value Date/Time   NA 136 05/23/2017 1250   K 3.9 05/23/2017 1250   CL 101 05/23/2017 1250   CO2 24 05/23/2017 1250   BUN 9 05/23/2017 1250   CREATININE 0.65 05/23/2017 1250      Component Value Date/Time   CALCIUM 9.1 05/23/2017 1250   ALKPHOS 59 05/23/2017 1250   AST 21 05/23/2017 1250   ALT 21 05/23/2017 1250   BILITOT 0.5 05/23/2017 1250     Lab Results  Component Value Date   WBC 9.4 05/29/2017   HGB 16.6 (H) 05/29/2017   HCT 49.8 (H) 05/29/2017   MCV 89.4 05/29/2017   PLT 298 05/29/2017   Lab Results  Component Value Date   LIPASE 14 07/23/2015   Lab Results  Component Value Date   HGBA1C 5.9 (H) 03/23/2014    IMPRESSION AND PLAN:  Acute sinusitis, with progressively worsening nausea that is now leading to intractable vomiting and dehydration.  She has inability to keep fluids down despite taking phenergan regularly.  Says zofran does not work at all for her.  Discussed options with pt today: either check CBC, CMET, UA, lipase, here today and check acute abdominal series today at med [facility] OR go to emergency department  for further evaluation and hopefully get IVF to see if this helps her feel a lot better and stay out of hospital.  We agreed that going to the emergency department made  the most sense. No testing done here today. I did send in amoxil 875mg  bid x 10d for her sinusitis.  Spent 30 min with pt today, with >50% of this time spent in counseling and care coordination regarding the above problems.  FOLLOW UP: Return for to be determined based on results of ER eval.  Signed:  Crissie Sickles, MD           05/29/2017

## 2017-05-29 NOTE — Patient Instructions (Signed)
Go to the Emergency department for evaluation of your nausea, vomiting, dehydration and abd pain.

## 2017-05-29 NOTE — ED Triage Notes (Signed)
Pt send over by Dr. Anitra Lauth for possible dehydration. Pt reports n/v x 2 weeks.

## 2017-05-29 NOTE — ED Provider Notes (Signed)
Emergency Department Provider Note   I have reviewed the triage vital signs and the nursing notes.   HISTORY  Chief Complaint Dehydration   HPI Martha Espinoza is a 52 y.o. female with PMH of anxiety, Bipolar disorder, GERD, HLD, and COPD since to the emergency department for evaluation of dehydration in the setting of vomiting and diarrhea.  Patient states that her symptoms have been ongoing for the past 2-3 weeks.  Her vomitus and diarrhea are nonbloody.  She denies abdominal pain.  She has been seen in the emergency department on 02/25 and 02/28 with unrelated complaints. No radiation of symptoms or modifying factors.   Past Medical History:  Diagnosis Date  . Anxiety   . Arthritis   . Bipolar 1 disorder ([place])   . Bronchitis, mucopurulent recurrent ([place])    PFTs 04/2016 showed no sign of copd or asthma, plus she actually had worse FEV1 after albuterol.  . Chronic pain syndrome    chronic low back pain (Pain mgmt= Dr. [name]).  MRI showed no signif disc dz, only showed some facet arthropathy at L4-5, with joint diastasis at L4-5--plan as of 11/07/16 neurosurg eval is bilat facet injections.  . Colitis due to Clostridium difficile 2001  . GERD (gastroesophageal reflux disease)   . Hepatic hemangioma   . Hepatic steatosis 06/2016   Noted incidentally on CT chest  . History of Salmonella gastroenteritis   . Hyperlipidemia    a. Noted 02/2014.  Marland Kitchen Hypertension    HCTZ started 07/2015  . IBS (irritable bowel syndrome)   . Iron deficiency anemia 05/2014   per pt it is not due to vaginal blood loss; hemoccults sent to pt in mail 413/16.  . Lumbar spondylosis    mild, mainly focused at L4-5 and L5-S1.  . Microscopic hematuria    Intermittent (no w/u done yet, as of 03/2014)  . Migraine headache   . Morbid obesity ([place])   . Multiple lipomas 09/2016   left upper arm--very small ones.  . Peripheral edema Fall 2018   R>L, non-pitting for the most part--venous doppler neg for DVT  01/2017.  Saw Dr. Bridgett Larsson (Vasc) 04/12/17 and u/s showed some venous reflux dz but he felt her sx's in legs were NOT due to her venous insufficiency OR PAD.  Thigh high comp stockings rx'd.  . Polycystic ovarian syndrome   . Pruritic dermatitis 2015/2016   +Scabies prep at [facility] 07/2014. (Primarily pruritic skin, but eventually a subtle rash as well)  . Rectal cancer ([place])    a. Followed by Dr. Gala Romney, dx 2000-2001. Tumor removed from rectal.  . Recurrent sinusitis    +allergic rhinitis  . Right shoulder pain 2017   Murphy/Wainer: RC bursitis + RC tear (MRI)--injection trial is plan per pt report 05/04/16.  . Tobacco dependence   . Vitamin D deficiency     Patient Active Problem List   Diagnosis Date Noted  . Chronic venous insufficiency 04/12/2017  . Acute respiratory failure with hypoxia ([place]) 10/18/2015  . COPD exacerbation ([place]) 10/18/2015  . Drug reaction 07/23/2015  . Fever of unknown origin 07/22/2015  . Lactic acidosis 07/22/2015  . Hypokalemia 07/22/2015  . Nausea and vomiting 07/22/2015  . Chronic back pain 07/22/2015  . Abdominal pain 07/26/2014  . Nausea without vomiting 07/26/2014  . History of colon cancer 07/26/2014  . Iron deficiency anemia 07/07/2014  . Vitamin D deficiency 07/07/2014  . Abnormal nuclear stress test 03/31/2014  . Abnormal cardiac CT angiography  03/31/2014  . Hyperlipidemia 03/24/2014  . Morbid obesity ([place])   . Chest pain of uncertain etiology 16/12/9602  . COPD (chronic obstructive pulmonary disease) ([place]) 03/23/2014  . Leukocytosis 03/23/2014  . Hyperglycemia 03/23/2014  . Bipolar 1 disorder ([place]) 03/23/2014  . Pruritic rash 03/23/2014  . Chronic anxiety 03/23/2014  . Tobacco abuse 03/23/2014  . Intractable migraine without aura and with status migrainosus 01/14/2014  . Sinusitis 07/02/2013  . Urinary frequency 07/02/2013  . Migraine 07/02/2013  . Anxiety state, unspecified 04/14/2013  . Obesity 04/14/2013  . Anemia 04/14/2013   . CONSTIPATION 10/12/2009  . CARCINOMA IN SITU OF RECTUM 08/25/2009  . GERD 08/25/2009  . IRRITABLE BOWEL SYNDROME 08/25/2009  . DIARRHEA, CHRONIC 08/25/2009  . HEMANGIOMA, HEPATIC 08/24/2009  . POLYCYSTIC OVARIAN DISEASE 08/24/2009  . Personal history of other diseases of digestive system 08/24/2009    Past Surgical History:  Procedure Laterality Date  . CARDIOVASCULAR STRESS TEST  03/24/14   Lexscan MIBI: mild anterior ischemia?  Cardiac CT angiogram recommended/done.  . CHOLECYSTECTOMY    . COLONOSCOPY  05/05/2007   adenoma  . coronary CT angio  03/29/14   Two vessel dz/moderate stenosis of mid LAD and proximal RCA; cath recommended.  Marland Kitchen DILATION AND CURETTAGE OF UTERUS     for vaginal bleeding  . JOINT REPLACEMENT    . LEFT HEART CATHETERIZATION WITH CORONARY ANGIOGRAM N/A 03/31/2014   Normal coronaries, EF 54-09%, diastolic dysfunction.  Procedure: LEFT HEART CATHETERIZATION WITH CORONARY ANGIOGRAM;  Surgeon: Sinclair Grooms, MD;  Location: [facility] CATH LAB;  Service: Cardiovascular;  Laterality: N/A;  . LOWER EXT VENOUS DOPPLERS Bilateral 02/08/2017   NEG for DVT (bilat)  . PFTs  04/2016   No sign of COPD or asthma; FEV 1 worse after albut.  . Situ removed  age 33   High-grade rectal adenoma removed from rectum   . TRANSTHORACIC ECHOCARDIOGRAM  03/23/14   Normal    Current Outpatient Rx  . Order #: [id number] Class: Historical Med  . Order #: [id number] Class: Print  . Order #: [id number] Class: Historical Med  . Order #: [id number] Class: Normal  . Order #: [id number] Class: Print  . Order #: [id number] Class: Normal  . Order #: [id number] Class: Print  . Order #: [id number] Class: Print  . Order #: [id number] Class: Historical Med  . Order #: [id number] Class: Historical Med  . Order #: [id number] Class: Historical Med  . Order #: [id number] Class: Print  . Order #: [id number] Class: Historical Med  . Order #: [id number] Class: Normal  . Order #: [id number] Class: Historical Med  . Order #:  [id number] Class: Historical Med  . Order #: [id number] Class: Normal  . Order #: [id number] Class: Historical Med  . Order #: [id number] Class: Normal  . Order #: [id number] Class: Historical Med  . Order #: [id number] Class: Normal  . Order #: [id number] Class: Normal  . Order #: [id number] Class: Normal    Allergies Contrast media [iodinated diagnostic agents]; Shellfish allergy; Codeine; Furosemide; Lamictal [lamotrigine]; Ivermectin; Betadine [povidone iodine]; Latex; Lipitor [atorvastatin]; Permethrin; and Tape  Family History  Problem Relation Age of Onset  . Colon cancer Father 84  . Cancer Father   . Diverticulitis Mother   . Heart disease Mother   . Hypertension Mother   . Hyperlipidemia Mother   . Mental illness Mother   . Diabetes Mother   . Heart attack Mother   . Stroke Neg Hx     Social History Social History   Tobacco Use  . Smoking status: Current Every Day Smoker    Packs/day: 0.50  Years: 18.00    Pack years: 9.00    Types: Cigarettes  . Smokeless tobacco: Never Used  . Tobacco comment: 25 years  Substance Use Topics  . Alcohol use: No    Alcohol/week: 0.0 oz  . Drug use: No    Review of Systems  Constitutional: No fever/chills Eyes: No visual changes. ENT: No sore throat. Cardiovascular: Denies chest pain. Respiratory: Denies shortness of breath. Gastrointestinal: No abdominal pain. Positive nausea and vomiting. Positive loose stools.  No constipation. Genitourinary: Negative for dysuria. Musculoskeletal: Negative for back pain. Skin: Negative for rash. Neurological: Negative for headaches, focal weakness or numbness.  10-point ROS otherwise negative.  ____________________________________________   PHYSICAL EXAM:  VITAL SIGNS: ED Triage Vitals [05/29/17 1603]  Enc Vitals Group     BP (!) 161/99     Pulse Rate (!) 105     Resp 14     Temp 98.4 F (36.9 C)     Temp Source Oral     SpO2 95 %     Weight 259 lb (117.5 kg)     Height 5\' 1"   (1.549 m)     Pain Score 0   Constitutional: Alert and oriented. Well appearing and in no acute distress. Eyes: Conjunctivae are normal. Head: Atraumatic. Nose: No congestion/rhinnorhea. Mouth/Throat: Mucous membranes are moist.   Neck: No stridor. Cardiovascular: Tachycardia. Good peripheral circulation. Grossly normal heart sounds.   Respiratory: Normal respiratory effort.  No retractions. Lungs CTAB. Gastrointestinal: Soft and nontender. No distention.  Musculoskeletal: No lower extremity tenderness nor edema. No gross deformities of extremities. Neurologic:  Normal speech and language. No gross focal neurologic deficits are appreciated.  Skin:  Skin is warm, dry and intact. No rash noted.  ____________________________________________   LABS (all labs ordered are listed, but only abnormal results are displayed)  Labs Reviewed  COMPREHENSIVE METABOLIC PANEL - Abnormal; Notable for the following components:      Result Value   Glucose, Bld 114 (*)    All other components within normal limits  CBC - Abnormal; Notable for the following components:   RBC 5.57 (*)    Hemoglobin 16.6 (*)    HCT 49.8 (*)    All other components within normal limits  URINALYSIS, ROUTINE W REFLEX MICROSCOPIC - Abnormal; Notable for the following components:   APPearance HAZY (*)    Leukocytes, UA MODERATE (*)    Squamous Epithelial / LPF 0-5 (*)    All other components within normal limits  URINE CULTURE  LIPASE, BLOOD  PREGNANCY, URINE   ____________________________________________  RADIOLOGY  Dg Abdomen Acute W/chest  Result Date: 05/30/2017 CLINICAL DATA:  Abdomen pain with nausea and vomiting EXAM: DG ABDOMEN ACUTE W/ 1V CHEST COMPARISON:  05/20/2017 FINDINGS: Single-view chest demonstrates mild cardiomegaly. No consolidation or effusion. No pneumothorax. Supine and upright views of the abdomen demonstrate no free air beneath the diaphragm. Nonobstructed bowel-gas pattern. No abnormal  calcification. Surgical clips in the right upper quadrant. IMPRESSION: 1. No radiographic evidence for acute cardiopulmonary abnormality 2. Nonobstructed gas pattern Electronically Signed   By: Donavan Foil M.D.   On: 05/30/2017 03:05    ____________________________________________   PROCEDURES  Procedure(s) performed:   Procedures  None ____________________________________________   INITIAL IMPRESSION / ASSESSMENT AND PLAN / ED COURSE  Pertinent labs & imaging results that were available during my care of the patient were reviewed by me and considered in my medical decision making (see chart for details).  Patient presents to the emergency department for  evaluation of dehydration in the setting of nausea, vomiting, loose stools over the past 2-3 weeks.  It is completely soft and nontender.  She does have dry mucous membranes and mild tachycardia on arrival.  Labs reviewed with some mild hemoconcentration.  Her urinalysis shows moderate leukocytes but no bacteria. No UTI symptoms so will not treat at this time. Plan for IVF and PO challenge.   Labs reviewed with some hemoconcentration. UA equivocal. Plan for IVF and reassessment. Care transferred to Dr. Wyvonnia Dusky who will reassess after IVF. Anticipate discharge after IVF and PO challenge.  ____________________________________________  FINAL CLINICAL IMPRESSION(S) / ED DIAGNOSES  Final diagnoses:  Orthostasis  Dehydration     MEDICATIONS GIVEN DURING THIS VISIT:  Medications  promethazine (PHENERGAN) injection 12.5 mg (12.5 mg Intravenous Given 05/30/17 0021)  sodium chloride 0.9 % bolus 1,000 mL (0 mLs Intravenous Stopped 05/30/17 0149)  sodium chloride 0.9 % bolus 1,000 mL (0 mLs Intravenous Stopped 05/30/17 0327)     NEW OUTPATIENT MEDICATIONS STARTED DURING THIS VISIT:  Discharge Medication List as of 05/30/2017  3:28 AM    START taking these medications   Details  amoxicillin (AMOXIL) 875 MG tablet Take 1 tablet (875 mg  total) by mouth 2 (two) times daily for 10 days., Starting Wed 05/29/2017, Until Sat 06/08/2017, Normal        Note:  This document was prepared using Dragon voice recognition software and may include unintentional dictation errors.  Nanda Quinton, MD Emergency Medicine    Long, Wonda Old[facility], MD 05/30/17 [phone]

## 2017-05-29 NOTE — Telephone Encounter (Signed)
Copied from [place]. Topic: Inquiry >> May 29, 2017  3:38 PM Conception Chancy, NT wrote: Patient is calling and states she is headed to the hospital per Dr. Anitra Lauth instructions. Patient states when Dr. Anitra Lauth gets done with her office notes she would like to know if she is going to be treated for a sinus infection. She would like a call back number.

## 2017-05-29 NOTE — Telephone Encounter (Signed)
Amoxil eRx'd.  CMA has already notifed pt.

## 2017-05-30 ENCOUNTER — Emergency Department (HOSPITAL_COMMUNITY): Payer: Commercial Managed Care - PPO

## 2017-05-30 DIAGNOSIS — R109 Unspecified abdominal pain: Secondary | ICD-10-CM | POA: Diagnosis not present

## 2017-05-30 DIAGNOSIS — I951 Orthostatic hypotension: Secondary | ICD-10-CM | POA: Diagnosis not present

## 2017-05-30 MED ORDER — SODIUM CHLORIDE 0.9 % IV BOLUS (SEPSIS)
1000.0000 mL | Freq: Once | INTRAVENOUS | Status: AC
  Administered 2017-05-30: 1000 mL via INTRAVENOUS

## 2017-05-30 NOTE — ED Notes (Signed)
Pt has not vomited since arrival to room

## 2017-05-30 NOTE — ED Notes (Signed)
ED Provider at bedside. 

## 2017-05-30 NOTE — Discharge Instructions (Signed)
Keep yourself hydrated.  Use your nausea medication as prescribed.  Follow-up with your doctor.  You will be called if your urine culture becomes positive.  Return to the ED if you develop new or worsening symptoms.

## 2017-05-30 NOTE — ED Provider Notes (Signed)
Care assumed from Dr. Laverta Baltimore.  Patient sent from PCP with concern for dehydration in setting of nausea, vomiting and diarrhea.  Abdomen is soft and nontender.  Patient afebrile.  Patient orthostatics are positive.  She is given IV and p.o. fluids.  Labs are reassuring with some hemoconcentration.  UA is equivocal and culture is sent.  She denies UTI symptoms. AAS negative.  She appears stable for PCP  Followup. Will give GI referral as well. She has phenergan at home. Return precautions discussed.   BP 109/76   Pulse 95   Temp 98.4 F (36.9 C) (Oral)   Resp 17   Ht 5\' 1"  (1.549 m)   Wt 117.5 kg (259 lb)   LMP 05/23/2017 (Approximate) Comment: negative pregnancy test during this visit  SpO2 97%   BMI 48.94 kg/m     Ezequiel Essex, MD 05/30/17 [phone]

## 2017-05-30 NOTE — ED Notes (Signed)
Pt finished water with no vomiting- pt still has not vomited since arrival.

## 2017-05-31 LAB — URINE CULTURE

## 2017-06-17 ENCOUNTER — Ambulatory Visit: Payer: Commercial Managed Care - PPO | Admitting: Family Medicine

## 2017-06-19 ENCOUNTER — Encounter: Payer: Self-pay | Admitting: Family Medicine

## 2017-06-19 ENCOUNTER — Ambulatory Visit: Payer: Commercial Managed Care - PPO | Admitting: Family Medicine

## 2017-06-19 VITALS — BP 134/87 | HR 91 | Temp 97.7°F | Resp 18 | Ht 62.0 in | Wt 264.0 lb

## 2017-06-19 DIAGNOSIS — K529 Noninfective gastroenteritis and colitis, unspecified: Secondary | ICD-10-CM | POA: Diagnosis not present

## 2017-06-19 DIAGNOSIS — E860 Dehydration: Secondary | ICD-10-CM | POA: Diagnosis not present

## 2017-06-19 DIAGNOSIS — I10 Essential (primary) hypertension: Secondary | ICD-10-CM

## 2017-06-19 MED ORDER — METOPROLOL TARTRATE 50 MG PO TABS: 50.0000 mg | ORAL_TABLET | Freq: Two times a day (BID) | ORAL | 1 refills | Status: DC

## 2017-06-19 NOTE — Progress Notes (Signed)
OFFICE VISIT  06/27/2017   CC:  Chief Complaint  Patient presents with  . Hospitalization Follow-up    dehydration,    HPI:    Patient is a 52 y.o. Caucasian female who presents for ED f/u --went to ED 05/29/17 for n/v/d and had orthostatic hypotension/tachycardia.  She improved with IV fluid hydration. Was also given referral to GI.  CBC showed mildly elevated Hb, consistent with hemoconcentration. CMET was normal.  Acute abd series 05/30/17: IMPRESSION: 1. No radiographic evidence for acute cardiopulmonary abnormality 2. Nonobstructed gas pattern.  Since that time she has felt pretty good.  Trying to hydrate well. Some night-time disequilibrium---occasional.  Trileptal being titrated lately.  Also, since I last saw her she was sent to ED for markedly elevated bp (510C systolic).  Was put on lopressor 25mg  bid. No home bp checks since starting lopressor but at most recent psych check it was 169/105, HR "high". No side effects from the lopressor.  Past Medical History:  Diagnosis Date  . Anxiety   . Arthritis   . Bipolar 1 disorder ([place])   . Bronchitis, mucopurulent recurrent ([place])    PFTs 04/2016 showed no sign of copd or asthma, plus she actually had worse FEV1 after albuterol.  . Chronic pain syndrome    chronic low back pain (Pain mgmt= Dr. [name]).  MRI showed no signif disc dz, only showed some facet arthropathy at L4-5, with joint diastasis at L4-5--plan as of 11/07/16 neurosurg eval is bilat facet injections.  . Colitis due to Clostridium difficile 2001  . GERD (gastroesophageal reflux disease)   . Hepatic hemangioma   . Hepatic steatosis 06/2016   Noted incidentally on CT chest  . History of Salmonella gastroenteritis   . Hyperlipidemia    a. Noted 02/2014.  Marland Kitchen Hypertension    HCTZ started 07/2015  . IBS (irritable bowel syndrome)   . Iron deficiency anemia 05/2014   per pt it is not due to vaginal blood loss; hemoccults sent to pt in mail 413/16.  . Lumbar  spondylosis    mild, mainly focused at L4-5 and L5-S1.  . Microscopic hematuria    Intermittent (no w/u done yet, as of 03/2014)  . Migraine headache   . Morbid obesity ([place])   . Multiple lipomas 09/2016   left upper arm--very small ones.  . Peripheral edema Fall 2018   R>L, non-pitting for the most part--venous doppler neg for DVT 01/2017.  Saw Dr. Bridgett Larsson (Vasc) 04/12/17 and u/s showed some venous reflux dz but he felt her sx's in legs were NOT due to her venous insufficiency OR PAD.  Thigh high comp stockings rx'd.  . Polycystic ovarian syndrome   . Pruritic dermatitis 2015/2016   +Scabies prep at [facility] 07/2014. (Primarily pruritic skin, but eventually a subtle rash as well)  . Rectal cancer ([place])    a. Followed by Dr. Gala Romney, dx 2000-2001. Tumor removed from rectal.  . Recurrent sinusitis    +allergic rhinitis  . Right shoulder pain 2017   Murphy/Wainer: RC bursitis + RC tear (MRI)--injection trial is plan per pt report 05/04/16.  . Tobacco dependence   . Vitamin D deficiency     Past Surgical History:  Procedure Laterality Date  . CARDIOVASCULAR STRESS TEST  03/24/14   Lexscan MIBI: mild anterior ischemia?  Cardiac CT angiogram recommended/done.  . CHOLECYSTECTOMY    . COLONOSCOPY  05/05/2007   adenoma  . coronary CT angio  03/29/14   Two vessel dz/moderate stenosis of  mid LAD and proximal RCA; cath recommended.  Marland Kitchen DILATION AND CURETTAGE OF UTERUS     for vaginal bleeding  . JOINT REPLACEMENT    . LEFT HEART CATHETERIZATION WITH CORONARY ANGIOGRAM N/A 03/31/2014   Normal coronaries, EF 17-51%, diastolic dysfunction.  Procedure: LEFT HEART CATHETERIZATION WITH CORONARY ANGIOGRAM;  Surgeon: Sinclair Grooms, MD;  Location: [facility] CATH LAB;  Service: Cardiovascular;  Laterality: N/A;  . LOWER EXT VENOUS DOPPLERS Bilateral 02/08/2017   NEG for DVT (bilat)  . PFTs  04/2016   No sign of COPD or asthma; FEV 1 worse after albut.  . Situ removed  age 82   High-grade rectal adenoma  removed from rectum   . TRANSTHORACIC ECHOCARDIOGRAM  03/23/14   Normal    Outpatient Medications Prior to Visit  Medication Sig Dispense Refill  . albuterol (PROVENTIL HFA) 108 (90 Base) MCG/ACT inhaler as needed    . aspirin 81 MG chewable tablet Chew 1 tablet (81 mg total) by mouth daily. 14 tablet 0  . busPIRone (BUSPAR) 15 MG tablet 1 in the morning and 2 at bedtime    . cetirizine (ZYRTEC) 10 MG tablet TAKE ONE TABLET BY MOUTH ONCE DAILY 30 tablet 11  . cyclobenzaprine (FLEXERIL) 10 MG tablet TAKE ONE TABLET BY MOUTH EVERY 6 HOURS AS NEEDED (SPASM RELATED TO HEADACHES) (Patient taking differently: Take 10 mg by mouth 3 (three) times daily. TAKE ONE TABLET BY MOUTH EVERY 6 HOURS AS NEEDED (SPASM RELATED TO HEADACHES)) 42 tablet 5  . diphenhydrAMINE (BENADRYL) 25 MG tablet Take 1 tablet (25 mg total) by mouth every 6 (six) hours. (Patient taking differently: Take 25-50 mg by mouth every 6 (six) hours as needed for itching or allergies. ) 20 tablet 0  . fluticasone (FLONASE) 50 MCG/ACT nasal spray Place 2 sprays into both nostrils daily. 16 g 1  . hydrOXYzine (ATARAX/VISTARIL) 25 MG tablet Take 25 mg by mouth 3 (three) times daily.     Marland Kitchen IRON-FOLIC ACID PO Take 65 mg by mouth daily.     . Magnesium-Potassium 40-40 MG CAPS Take 1 capsule by mouth daily.    Melynda Ripple Tosylate (SYMPROIC) 0.2 MG TABS Take 1 tablet by mouth daily.    . nitroGLYCERIN (NITROSTAT) 0.4 MG SL tablet Place 1 tablet (0.4 mg total) under the tongue every 5 (five) minutes as needed for chest pain. 20 tablet 0  . nystatin (MYCOSTATIN) 100000 UNIT/ML suspension 1 tsp po qid swish, gargle, and spit x 14d (Patient taking differently: Use as directed 5 mLs in the mouth or throat 4 (four) times daily. 1 tsp po qid swish, gargle, and spit x 14d) 280 mL 0  . Oxcarbazepine (TRILEPTAL) 300 MG tablet Take 150 mg by mouth daily.    Marland Kitchen oxyCODONE-acetaminophen (PERCOCET) 10-325 MG tablet Take 1 tablet by mouth 3 (three) times daily.      . pantoprazole (PROTONIX) 40 MG tablet TAKE ONE TABLET BY MOUTH ONCE DAILY 90 tablet 3  . pregabalin (LYRICA) 75 MG capsule Take 75 mg by mouth 3 (three) times daily.     . promethazine (PHENERGAN) 25 MG tablet TAKE 1 TABLET BY MOUTH EVERY 6 HOURS AS NEEDED FOR NAUSEA AND VOMITING 30 tablet 3  . ranitidine (ZANTAC) 150 MG tablet Take 150 mg by mouth 2 (two) times daily.    Marland Kitchen rOPINIRole (REQUIP) 0.25 MG tablet TAKE 3 TABLETS BY MOUTH ONCE DAILY AT BEDTIME 90 tablet 6  . clonazePAM (KLONOPIN) 0.5 MG tablet TAKE 1 TABLET BY  MOUTH ONCE DAILY DURING THE DAY AS NEEDED, THEN TAKE 2 TABLETS AT BEDTIME. 90 tablet 5  . metoprolol tartrate (LOPRESSOR) 25 MG tablet Take 2 tablets (50 mg total) by mouth 2 (two) times daily. 60 tablet 0   No facility-administered medications prior to visit.     Allergies  Allergen Reactions  . Contrast Media [Iodinated Diagnostic Agents] Anaphylaxis    Needs 13-hour prep  . Shellfish Allergy Anaphylaxis  . Codeine Nausea And Vomiting and Other (See Comments)    Hallucinations, Also sees people that are not there   . Furosemide Rash    Rash, soB.  . Lamictal [Lamotrigine] Rash  . Ivermectin Nausea And Vomiting    N/V and rash  . Betadine [Povidone Iodine] Rash  . Latex Hives  . Lipitor [Atorvastatin] Itching  . Permethrin Itching  . Tape Dermatitis    Paper tape and clear    ROS As per HPI  PE: Blood pressure 134/87, pulse 91, temperature 97.7 F (36.5 C), temperature source Oral, resp. rate 18, height 5\' 2"  (1.575 m), weight 264 lb (119.7 kg), last menstrual period 05/23/2017, SpO2 96 %. Gen: Alert, well appearing.  Patient is oriented to person, place, time, and situation. AFFECT: pleasant, lucid thought and speech. IRS:WNIO: no injection, icteris, swelling, or exudate.  EOMI, PERRLA. Mouth: lips without lesion/swelling.  Oral mucosa pink and moist. Oropharynx without erythema, exudate, or swelling.  CV: RRR, no m/r/g.   LUNGS: CTA bilat, nonlabored  resps, good aeration in all lung fields. EXT: no clubbing, cyanosis, or edema.   LABS:  Lab Results  Component Value Date   TSH 1.12 10/08/2016   Lab Results  Component Value Date   WBC 9.4 05/29/2017   HGB 16.6 (H) 05/29/2017   HCT 49.8 (H) 05/29/2017   MCV 89.4 05/29/2017   PLT 298 05/29/2017   Lab Results  Component Value Date   IRON 42 06/22/2014   FERRITIN 22.0 08/24/2015    Lab Results  Component Value Date   CREATININE 0.71 05/29/2017   BUN 11 05/29/2017   NA 136 05/29/2017   K 4.2 05/29/2017   CL 101 05/29/2017   CO2 22 05/29/2017   Lab Results  Component Value Date   ALT 17 05/29/2017   AST 21 05/29/2017   ALKPHOS 71 05/29/2017   BILITOT 0.9 05/29/2017   Lab Results  Component Value Date   CHOL 263 (H) 03/24/2014   Lab Results  Component Value Date   HDL 47 03/24/2014   Lab Results  Component Value Date   LDLCALC 169 (H) 03/24/2014   Lab Results  Component Value Date   TRIG 237 (H) 03/24/2014   Lab Results  Component Value Date   CHOLHDL 5.6 03/24/2014   Lab Results  Component Value Date   HGBA1C 5.9 (H) 03/23/2014    IMPRESSION AND PLAN:  1) Acute gastroenteritis with subsequent dehydration.  Resolved.  2) HTN, poor control.  Improved some since addition of lopressor 25mg  bid by EDP. Will increase lopressor to 50mg  bid today.  An After Visit Summary was printed and given to the patient.  FOLLOW UP: Return for f/u HTN in 3-4 weeks.  Signed:  Crissie Sickles, MD           06/27/2017

## 2017-06-21 ENCOUNTER — Other Ambulatory Visit: Payer: Self-pay | Admitting: Family Medicine

## 2017-06-21 NOTE — Telephone Encounter (Signed)
[facility]  RF request for clonazepam LOV: No recent ov for RCI Next ov: 07/17/17 Last written: 12/19/16 #90 w/ 5RF  Please advise. Thanks.

## 2017-06-25 NOTE — Telephone Encounter (Signed)
Will do 1 mo supply until she follows up 07/17/17.

## 2017-06-25 NOTE — Telephone Encounter (Signed)
Patient notified and verbalized understanding. 

## 2017-07-11 DIAGNOSIS — G43701 Chronic migraine without aura, not intractable, with status migrainosus: Secondary | ICD-10-CM | POA: Diagnosis not present

## 2017-07-11 DIAGNOSIS — M545 Low back pain: Secondary | ICD-10-CM | POA: Diagnosis not present

## 2017-07-11 DIAGNOSIS — M25511 Pain in right shoulder: Secondary | ICD-10-CM | POA: Diagnosis not present

## 2017-07-15 ENCOUNTER — Encounter: Payer: Self-pay | Admitting: Vascular Surgery

## 2017-07-15 ENCOUNTER — Ambulatory Visit: Payer: Commercial Managed Care - PPO | Admitting: Vascular Surgery

## 2017-07-15 ENCOUNTER — Other Ambulatory Visit: Payer: Self-pay

## 2017-07-15 VITALS — BP 144/101 | HR 106 | Resp 18 | Ht 62.0 in | Wt 264.0 lb

## 2017-07-15 DIAGNOSIS — M79605 Pain in left leg: Secondary | ICD-10-CM | POA: Diagnosis not present

## 2017-07-15 DIAGNOSIS — M79604 Pain in right leg: Secondary | ICD-10-CM | POA: Diagnosis not present

## 2017-07-15 NOTE — Progress Notes (Signed)
Subjective:     Patient ID: Martha Espinoza, female   DOB: 12-02-1965, 52 y.o.   MRN: [id number]  HPI This 52 year old female returns for 27-month follow up having been evaluated by Dr. Geryl Councilman for bilateral leg pain and swelling.  The patient is tried long-leg elastic compression stockings 20-30 mm gradient as well as elevation with no improvement in her symptoms.  She does have chronic back and neck symptoms and has been seen in the pain clinic.  She has been told that her back problem is not causing her leg pain.  She has no history of DVT thrombophlebitis stasis ulcers or bleeding.  She denies varicose veins.  She does develop some swelling in both legs as the day progresses.  Her left leg is slightly worse on the right and causes an achy throbby  numb type feeling.  Past Medical History:  Diagnosis Date  . Anxiety   . Arthritis   . Bipolar 1 disorder ([place])   . Bronchitis, mucopurulent recurrent ([place])    PFTs 04/2016 showed no sign of copd or asthma, plus she actually had worse FEV1 after albuterol.  . Chronic pain syndrome    chronic low back pain (Pain mgmt= Dr. [name]).  MRI showed no signif disc dz, only showed some facet arthropathy at L4-5, with joint diastasis at L4-5--plan as of 11/07/16 neurosurg eval is bilat facet injections.  . Colitis due to Clostridium difficile 2001  . GERD (gastroesophageal reflux disease)   . Hepatic hemangioma   . Hepatic steatosis 06/2016   Noted incidentally on CT chest  . History of Salmonella gastroenteritis   . Hyperlipidemia    a. Noted 02/2014.  Marland Kitchen Hypertension    HCTZ started 07/2015  . IBS (irritable bowel syndrome)   . Iron deficiency anemia 05/2014   per pt it is not due to vaginal blood loss; hemoccults sent to pt in mail 413/16.  . Lumbar spondylosis    mild, mainly focused at L4-5 and L5-S1.  . Microscopic hematuria    Intermittent (no w/u done yet, as of 03/2014)  . Migraine headache   . Morbid obesity ([place])   . Multiple lipomas 09/2016    left upper arm--very small ones.  . Peripheral edema Fall 2018   R>L, non-pitting for the most part--venous doppler neg for DVT 01/2017.  Saw Dr. Bridgett Larsson (Vasc) 04/12/17 and u/s showed some venous reflux dz but he felt her sx's in legs were NOT due to her venous insufficiency OR PAD.  Thigh high comp stockings rx'd.  . Polycystic ovarian syndrome   . Pruritic dermatitis 2015/2016   +Scabies prep at [facility] 07/2014. (Primarily pruritic skin, but eventually a subtle rash as well)  . Rectal cancer ([place])    a. Followed by Dr. Gala Romney, dx 2000-2001. Tumor removed from rectal.  . Recurrent sinusitis    +allergic rhinitis  . Right shoulder pain 2017   Murphy/Wainer: RC bursitis + RC tear (MRI)--injection trial is plan per pt report 05/04/16.  . Tobacco dependence   . Vitamin D deficiency     Social History   Tobacco Use  . Smoking status: Current Every Day Smoker    Packs/day: 0.50    Years: 18.00    Pack years: 9.00    Types: Cigarettes  . Smokeless tobacco: Never Used  . Tobacco comment: 25 years  Substance Use Topics  . Alcohol use: No    Alcohol/week: 0.0 oz    Family History  Problem Relation Age of  Onset  . Colon cancer Father 67  . Cancer Father   . Diverticulitis Mother   . Heart disease Mother   . Hypertension Mother   . Hyperlipidemia Mother   . Mental illness Mother   . Diabetes Mother   . Heart attack Mother   . Stroke Neg Hx     Allergies  Allergen Reactions  . Contrast Media [Iodinated Diagnostic Agents] Anaphylaxis    Needs 13-hour prep  . Shellfish Allergy Anaphylaxis  . Codeine Nausea And Vomiting and Other (See Comments)    Hallucinations, Also sees people that are not there   . Furosemide Rash    Rash, soB.  . Lamictal [Lamotrigine] Rash  . Ivermectin Nausea And Vomiting    N/V and rash  . Betadine [Povidone Iodine] Rash  . Latex Hives  . Lipitor [Atorvastatin] Itching  . Permethrin Itching  . Tape Dermatitis    Paper tape and clear      Current Outpatient Medications:  .  albuterol (PROVENTIL HFA) 108 (90 Base) MCG/ACT inhaler, as needed, Disp: , Rfl:  .  aspirin 81 MG chewable tablet, Chew 1 tablet (81 mg total) by mouth daily., Disp: 14 tablet, Rfl: 0 .  busPIRone (BUSPAR) 15 MG tablet, 1 in the morning and 2 at bedtime, Disp: , Rfl:  .  cetirizine (ZYRTEC) 10 MG tablet, TAKE ONE TABLET BY MOUTH ONCE DAILY, Disp: 30 tablet, Rfl: 11 .  clonazePAM (KLONOPIN) 0.5 MG tablet, TAKE 1 TABLET BY MOUTH ONCE DAILY DURING THE  DAY AS NEEDED, THEN TAKE 2 TABLETS AT BEDTIME., Disp: 90 tablet, Rfl: 0 .  cyclobenzaprine (FLEXERIL) 10 MG tablet, TAKE ONE TABLET BY MOUTH EVERY 6 HOURS AS NEEDED (SPASM RELATED TO HEADACHES) (Patient taking differently: Take 10 mg by mouth 3 (three) times daily. TAKE ONE TABLET BY MOUTH EVERY 6 HOURS AS NEEDED (SPASM RELATED TO HEADACHES)), Disp: 42 tablet, Rfl: 5 .  diphenhydrAMINE (BENADRYL) 25 MG tablet, Take 1 tablet (25 mg total) by mouth every 6 (six) hours. (Patient taking differently: Take 25-50 mg by mouth every 6 (six) hours as needed for itching or allergies. ), Disp: 20 tablet, Rfl: 0 .  fluticasone (FLONASE) 50 MCG/ACT nasal spray, Place 2 sprays into both nostrils daily., Disp: 16 g, Rfl: 1 .  hydrOXYzine (ATARAX/VISTARIL) 25 MG tablet, Take 25 mg by mouth 3 (three) times daily. , Disp: , Rfl:  .  IRON-FOLIC ACID PO, Take 65 mg by mouth daily. , Disp: , Rfl:  .  Magnesium-Potassium 40-40 MG CAPS, Take 1 capsule by mouth daily., Disp: , Rfl:  .  metoprolol tartrate (LOPRESSOR) 50 MG tablet, Take 1 tablet (50 mg total) by mouth 2 (two) times daily., Disp: 60 tablet, Rfl: 1 .  Naldemedine Tosylate (SYMPROIC) 0.2 MG TABS, Take 1 tablet by mouth daily., Disp: , Rfl:  .  nitroGLYCERIN (NITROSTAT) 0.4 MG SL tablet, Place 1 tablet (0.4 mg total) under the tongue every 5 (five) minutes as needed for chest pain., Disp: 20 tablet, Rfl: 0 .  Oxcarbazepine (TRILEPTAL) 300 MG tablet, Take 150 mg by mouth  daily., Disp: , Rfl:  .  oxyCODONE-acetaminophen (PERCOCET) 10-325 MG tablet, Take 1 tablet by mouth 3 (three) times daily. , Disp: , Rfl:  .  pantoprazole (PROTONIX) 40 MG tablet, TAKE ONE TABLET BY MOUTH ONCE DAILY, Disp: 90 tablet, Rfl: 3 .  pregabalin (LYRICA) 75 MG capsule, Take 150 mg by mouth 3 (three) times daily. , Disp: , Rfl:  .  promethazine (PHENERGAN)  25 MG tablet, TAKE 1 TABLET BY MOUTH EVERY 6 HOURS AS NEEDED FOR NAUSEA AND VOMITING, Disp: 30 tablet, Rfl: 3 .  ranitidine (ZANTAC) 150 MG tablet, Take 150 mg by mouth 2 (two) times daily., Disp: , Rfl:  .  rOPINIRole (REQUIP) 0.25 MG tablet, TAKE 3 TABLETS BY MOUTH ONCE DAILY AT BEDTIME, Disp: 90 tablet, Rfl: 6 .  nystatin (MYCOSTATIN) 100000 UNIT/ML suspension, 1 tsp po qid swish, gargle, and spit x 14d (Patient not taking: Reported on 07/15/2017), Disp: 280 mL, Rfl: 0  Vitals:   07/15/17 1337 07/15/17 1338  BP: (!) 143/100 (!) 144/101  Pulse: (!) 106   Resp: 18   SpO2: 96%   Weight: 264 lb (119.7 kg)   Height: 5\' 2"  (1.575 m)     Body mass index is 48.29 kg/m.         Review of Systems Chronic obesity.  Denies chest pain, dyspnea on exertion, PND, orthopnea.    Objective:   Physical Exam BP (!) 144/101 Comment: recheck  Pulse (!) 106   Resp 18   Ht 5\' 2"  (1.575 m)   Wt 264 lb (119.7 kg)   SpO2 96%   BMI 48.29 kg/m   Tunnel morbidly obese female no apparent distress alert and oriented x3 Lungs no rhonchi or wheezing Both legs with trace to 1+ edema distally.  No physical evidence of chronic venous insufficiency otherwise including hyperpigmentation, ulceration, bulging varicosities, reticular veins, or spider veins.  3+ dorsalis pedis pulses palpable bilaterally.  Today I reviewed the venous reflux report from previous visit which reveals enlargement of bilateral great saphenous veins with some reflux in both systems the left vein being larger than the right.  There is no DVT or deep vein reflux noted.      Assessment:     Bilateral leg pain and swelling-etiology unknown Bilateral enlargement of great saphenous vein with areas of gross reflux No evidence of DVT History of chronic back pain-has been seen in the pain management clinic Exline chronic obesity    Plan:     I discussed with patient the fact that I am not certain bilateral laser ablation of the great saphenous veins would improve or relieve her pain and swelling. I am still unsure that part of her symptoms may not be due to back pain with obesity I think it would be reasonable to proceed with bilateral laser ablation of great saphenous veins assuming the patient understands that she may get no symptomatic improvement from this She will consider this and make a decision She may need further evaluation from a neurosurgeon regarding her lumbar spine

## 2017-07-17 ENCOUNTER — Ambulatory Visit: Payer: Commercial Managed Care - PPO | Admitting: Family Medicine

## 2017-07-17 ENCOUNTER — Encounter: Payer: Self-pay | Admitting: Family Medicine

## 2017-07-17 VITALS — BP 125/84 | Resp 16 | Ht 62.0 in | Wt 264.4 lb

## 2017-07-17 DIAGNOSIS — M5441 Lumbago with sciatica, right side: Secondary | ICD-10-CM

## 2017-07-17 DIAGNOSIS — H65111 Acute and subacute allergic otitis media (mucoid) (sanguinous) (serous), right ear: Secondary | ICD-10-CM

## 2017-07-17 DIAGNOSIS — R29898 Other symptoms and signs involving the musculoskeletal system: Secondary | ICD-10-CM

## 2017-07-17 DIAGNOSIS — J441 Chronic obstructive pulmonary disease with (acute) exacerbation: Secondary | ICD-10-CM

## 2017-07-17 DIAGNOSIS — M5440 Lumbago with sciatica, unspecified side: Secondary | ICD-10-CM | POA: Diagnosis not present

## 2017-07-17 DIAGNOSIS — F419 Anxiety disorder, unspecified: Secondary | ICD-10-CM | POA: Diagnosis not present

## 2017-07-17 DIAGNOSIS — G8929 Other chronic pain: Secondary | ICD-10-CM | POA: Diagnosis not present

## 2017-07-17 DIAGNOSIS — R209 Unspecified disturbances of skin sensation: Secondary | ICD-10-CM

## 2017-07-17 DIAGNOSIS — IMO0001 Reserved for inherently not codable concepts without codable children: Secondary | ICD-10-CM

## 2017-07-17 DIAGNOSIS — J0100 Acute maxillary sinusitis, unspecified: Secondary | ICD-10-CM | POA: Diagnosis not present

## 2017-07-17 DIAGNOSIS — H65191 Other acute nonsuppurative otitis media, right ear: Secondary | ICD-10-CM

## 2017-07-17 MED ORDER — AMOXICILLIN 875 MG PO TABS: 875.0000 mg | ORAL_TABLET | Freq: Two times a day (BID) | ORAL | 0 refills | Status: DC

## 2017-07-17 MED ORDER — TELMISARTAN 20 MG PO TABS: 20.0000 mg | ORAL_TABLET | Freq: Every day | ORAL | 3 refills | Status: DC

## 2017-07-17 MED ORDER — CLONAZEPAM 0.5 MG PO TABS: ORAL_TABLET | ORAL | 5 refills | Status: DC

## 2017-07-17 MED ORDER — PREDNISONE 10 MG PO TABS: ORAL_TABLET | ORAL | 0 refills | Status: DC

## 2017-07-17 NOTE — Progress Notes (Signed)
OFFICE VISIT  07/25/2017   CC:  Chief Complaint  Patient presents with  . Follow-up    RCI  . URI    right ear pain   HPI:    Patient is a 52 y.o. Caucasian female who presents accompanied by her husband for 1 mo f/u HTN. Last visit her bp was a little improved since getting started on lopressor 25mg  bid. I increased her lopressor to 50mg  bid at that time.  HTN: home bp's last couple weeks 130s/90s avg, HR avg 100.  Has chronic lower leg discomfort and sensation of swelling, has been evaluated by vasc surgery who documented bilateral enlargement of great saphenous vein with areas of gross reflux but he was not certain that procedure on this would help her symptoms. She was considering his offer of this procedure at the time of last f/u with him 2 d/a.  He also felt like some of this pain may be related to her obesity and chronic LBP and commented that she may need to see a neurosurgeon in the future.  She describes chronic bilat LBP that occasionally radiates down either leg briefly.  Her bilat LL paresthesias sometimes occur with the radicular pain, sometimes not.  Describes the paresthesia distribution as "sock-like", from feet up to just below the knees.  Sx's worse with ambulation.  Also reports increased nasal congestion/mucous, sneezing in the last week, then onset of bad R ear pain yesterday. Some cough/wheeze--requiring more albuterol recently.  No fever, no face pain.  Psych: not doing well lately since getting off lamictal (rash)--having trouble getting something in it's place to work.   Her most recent MRI L spine was 10/23/16: CLINICAL DATA:  Low back pain.  RIGHT buttock and leg pain.  EXAM: MRI LUMBAR SPINE WITHOUT CONTRAST  TECHNIQUE: Multiplanar, multisequence MR imaging of the lumbar spine was performed. No intravenous contrast was administered.  COMPARISON:  MRI lumbar spine 04/05/2015.  FINDINGS: Segmentation:  Standard.  Alignment: Trace  anterolisthesis L3-4 is facet mediated. No pars defects.  Vertebrae:  No worrisome osseous lesion.  Scattered bone islands.  Conus medullaris: Extends to the L1 level and appears normal.  Paraspinal and other soft tissues: Renal and hepatic T2 hyperintensities, incompletely evaluated. Further investigation with abdominal sonography could be indicated.  Disc levels:  L1-L2:  Annular bulge.  No impingement.  L2-L3:  Normal disc space.  Mild facet arthropathy.  No impingement.  L3-L4: Disc desiccation. Trace anterolisthesis. Mild facet arthropathy. No impingement.  L4-L5: Prominent BILATERAL facet joint effusions, and bony overgrowth, worse on the RIGHT. The disc appears normal. There is no stenosis or neural impingement.  L5-S1:  Normal disc space.  Facet arthropathy.  No impingement.  Compared with priors, there is progression of facet arthropathy at L4-5, with increasingly prominent joint effusions.  IMPRESSION: Progression of facet arthropathy at L4-5, with increasingly prominent facet joint effusions, worse on the RIGHT. Correlate clinically for facet mediated lumbago. No disc protrusion or spinal stenosis.    Past Medical History:  Diagnosis Date  . Anxiety   . Arthritis   . Bilateral leg pain 2018/19   ? venous reflux.  . Bipolar 1 disorder ([place])   . Bronchitis, mucopurulent recurrent ([place])    PFTs 04/2016 showed no sign of copd or asthma, plus she actually had worse FEV1 after albuterol.  . Chronic pain syndrome    chronic low back pain (Pain mgmt= Dr. [name]).  MRI showed no signif disc dz, only showed some facet arthropathy at L4-5,  with joint diastasis at L4-5--plan as of 11/07/16 neurosurg eval is bilat facet injections.  . Colitis due to Clostridium difficile 2001  . GERD (gastroesophageal reflux disease)   . Hepatic hemangioma   . Hepatic steatosis 06/2016   Noted incidentally on CT chest  . History of Salmonella gastroenteritis   .  Hyperlipidemia    a. Noted 02/2014.  Marland Kitchen Hypertension    HCTZ started 07/2015  . IBS (irritable bowel syndrome)   . Iron deficiency anemia 05/2014   per pt it is not due to vaginal blood loss; hemoccults sent to pt in mail 413/16.  . Lumbar spondylosis    mild, mainly focused at L4-5 and L5-S1.  . Microscopic hematuria    Intermittent (no w/u done yet, as of 03/2014)  . Migraine headache   . Morbid obesity ([place])   . Multiple lipomas 09/2016   left upper arm--very small ones.  . Peripheral edema Fall 2018   R>L, non-pitting for the most part--venous doppler neg for DVT 01/2017.  Saw Dr. Bridgett Larsson (Vasc) 04/12/17 and u/s showed some venous reflux dz but he felt her sx's in legs were NOT due to her venous insufficiency OR PAD.  Thigh high comp stockings rx'd.  . Polycystic ovarian syndrome   . Pruritic dermatitis 2015/2016   +Scabies prep at [facility] 07/2014. (Primarily pruritic skin, but eventually a subtle rash as well)  . Rectal cancer ([place])    a. Followed by Dr. Gala Romney, dx 2000-2001. Tumor removed from rectal.  . Recurrent sinusitis    +allergic rhinitis  . Right shoulder pain 2017   Murphy/Wainer: RC bursitis + RC tear (MRI)--injection trial is plan per pt report 05/04/16.  . Tobacco dependence   . Vitamin D deficiency     Past Surgical History:  Procedure Laterality Date  . CARDIOVASCULAR STRESS TEST  03/24/14   Lexscan MIBI: mild anterior ischemia?  Cardiac CT angiogram recommended/done.  . CHOLECYSTECTOMY    . COLONOSCOPY  05/05/2007   adenoma  . coronary CT angio  03/29/14   Two vessel dz/moderate stenosis of mid LAD and proximal RCA; cath recommended.  Marland Kitchen DILATION AND CURETTAGE OF UTERUS     for vaginal bleeding  . JOINT REPLACEMENT    . LEFT HEART CATHETERIZATION WITH CORONARY ANGIOGRAM N/A 03/31/2014   Normal coronaries, EF 22-29%, diastolic dysfunction.  Procedure: LEFT HEART CATHETERIZATION WITH CORONARY ANGIOGRAM;  Surgeon: Sinclair Grooms, MD;  Location: [facility] CATH LAB;   Service: Cardiovascular;  Laterality: N/A;  . LOWER EXT VENOUS DOPPLERS Bilateral 02/08/2017   NEG for DVT (bilat)  . PFTs  04/2016   No sign of COPD or asthma; FEV 1 worse after albut.  . Situ removed  age 75   High-grade rectal adenoma removed from rectum   . TRANSTHORACIC ECHOCARDIOGRAM  03/23/14   Normal    Outpatient Medications Prior to Visit  Medication Sig Dispense Refill  . albuterol (PROVENTIL HFA) 108 (90 Base) MCG/ACT inhaler as needed    . aspirin 81 MG chewable tablet Chew 1 tablet (81 mg total) by mouth daily. 14 tablet 0  . busPIRone (BUSPAR) 15 MG tablet 1 in the morning and 2 at bedtime    . cetirizine (ZYRTEC) 10 MG tablet TAKE ONE TABLET BY MOUTH ONCE DAILY 30 tablet 11  . cyclobenzaprine (FLEXERIL) 10 MG tablet TAKE ONE TABLET BY MOUTH EVERY 6 HOURS AS NEEDED (SPASM RELATED TO HEADACHES) (Patient taking differently: Take 10 mg by mouth 3 (three) times daily. TAKE  ONE TABLET BY MOUTH EVERY 6 HOURS AS NEEDED (SPASM RELATED TO HEADACHES)) 42 tablet 5  . diphenhydrAMINE (BENADRYL) 25 MG tablet Take 1 tablet (25 mg total) by mouth every 6 (six) hours. (Patient taking differently: Take 25-50 mg by mouth every 6 (six) hours as needed for itching or allergies. ) 20 tablet 0  . fluticasone (FLONASE) 50 MCG/ACT nasal spray Place 2 sprays into both nostrils daily. 16 g 1  . hydrOXYzine (ATARAX/VISTARIL) 25 MG tablet Take 25 mg by mouth 3 (three) times daily.     Marland Kitchen IRON-FOLIC ACID PO Take 65 mg by mouth daily.     . Magnesium-Potassium 40-40 MG CAPS Take 1 capsule by mouth daily.    . metoprolol tartrate (LOPRESSOR) 50 MG tablet Take 1 tablet (50 mg total) by mouth 2 (two) times daily. 60 tablet 1  . Naldemedine Tosylate (SYMPROIC) 0.2 MG TABS Take 1 tablet by mouth daily.    . nitroGLYCERIN (NITROSTAT) 0.4 MG SL tablet Place 1 tablet (0.4 mg total) under the tongue every 5 (five) minutes as needed for chest pain. 20 tablet 0  . nystatin (MYCOSTATIN) 100000 UNIT/ML suspension 1 tsp  po qid swish, gargle, and spit x 14d 280 mL 0  . Oxcarbazepine (TRILEPTAL) 300 MG tablet Take 150 mg by mouth daily.    Marland Kitchen oxyCODONE-acetaminophen (PERCOCET) 10-325 MG tablet Take 1 tablet by mouth 3 (three) times daily.     . pantoprazole (PROTONIX) 40 MG tablet TAKE ONE TABLET BY MOUTH ONCE DAILY 90 tablet 3  . pregabalin (LYRICA) 75 MG capsule Take 150 mg by mouth 3 (three) times daily.     . promethazine (PHENERGAN) 25 MG tablet TAKE 1 TABLET BY MOUTH EVERY 6 HOURS AS NEEDED FOR NAUSEA AND VOMITING 30 tablet 3  . ranitidine (ZANTAC) 150 MG tablet Take 150 mg by mouth 2 (two) times daily.    Marland Kitchen rOPINIRole (REQUIP) 0.25 MG tablet TAKE 3 TABLETS BY MOUTH ONCE DAILY AT BEDTIME 90 tablet 6  . clonazePAM (KLONOPIN) 0.5 MG tablet TAKE 1 TABLET BY MOUTH ONCE DAILY DURING THE  DAY AS NEEDED, THEN TAKE 2 TABLETS AT BEDTIME. 90 tablet 0   No facility-administered medications prior to visit.     Allergies  Allergen Reactions  . Contrast Media [Iodinated Diagnostic Agents] Anaphylaxis    Needs 13-hour prep  . Shellfish Allergy Anaphylaxis  . Codeine Nausea And Vomiting and Other (See Comments)    Hallucinations, Also sees people that are not there   . Furosemide Rash    Rash, soB.  . Lamictal [Lamotrigine] Rash  . Ivermectin Nausea And Vomiting    N/V and rash  . Betadine [Povidone Iodine] Rash  . Latex Hives  . Lipitor [Atorvastatin] Itching  . Permethrin Itching  . Tape Dermatitis    Paper tape and clear    ROS As per HPI  PE: Blood pressure 125/84, resp. rate 16, height 5\' 2"  (1.575 m), weight 264 lb 6 oz (119.9 kg), last menstrual period 07/17/2017, SpO2 93 %. Gen: alert, tired appearing but NAD. VS: noted--normal. Gen: alert, NAD, NONTOXIC APPEARING. HEENT: eyes without injection, drainage, or swelling.  Ears: EACs clear, L TM with normal light reflex and landmarks.  R TM erythematous, mucoid fluid behind intact TM.   Nose: Clear rhinorrhea, with some dried, crusty exudate  adherent to mildly injected mucosa, incomplete nasal obstr on R.  No purulent d/c.  R max sinus TTP.  No facial swelling.  Throat and mouth without focal lesion.  No pharyngial swelling, erythema, or exudate.   Neck: supple, no LAD.   LUNGS: CTA bilat on inspiration, diffuse exp wheezing with prolonged exp phase and diminished aeration bilat, nonlabored resps.   CV: RRR, no m/r/g. EXT: no c/c/e SKIN: no rash BACK: mild diffuse L spine TTP.  LL strength 4/5 strength bilat.  Unable to elicit any DTRs in LEs.   LABS:    Chemistry      Component Value Date/Time   NA 136 05/29/2017 1638   K 4.2 05/29/2017 1638   CL 101 05/29/2017 1638   CO2 22 05/29/2017 1638   BUN 11 05/29/2017 1638   CREATININE 0.71 05/29/2017 1638      Component Value Date/Time   CALCIUM 9.6 05/29/2017 1638   ALKPHOS 71 05/29/2017 1638   AST 21 05/29/2017 1638   ALT 17 05/29/2017 1638   BILITOT 0.9 05/29/2017 1638      IMPRESSION AND PLAN:  1) HTN, not ideal control.  Add telmisartan 20 mg qd.  2) Acute exacerbation of COPD: amoxil 875 mg bid x 10d, prednisone 15 day taper (40-20-10).  3) Chronic LBP with stocking-like paresthesias in LL's, + leg weakness that is symmetric/bilat. Will repeat MRI L spine--needs open MRI due to claustrophobia. She has seen vascular MD and their report is that this is not due to venous or arterial disease.  4) Chronic severe anxiety: clonazepam rx renewed. Controlled substance contract reviewed with patient today.  Patient signed this and it will be placed in the chart.   She already gets UDS's through her pain mgmt MD so we'll see if we can get periodic copies of these results rather than duplicate this here.  5) R AOM--amoxil 875mg  bid x 10d (this has historically been the only antibiotic she can tolerate).  Spent 40 min with pt today, with >50% of this time spent in counseling and care coordination regarding the above problems.  An After Visit Summary was printed and  given to the patient.  FOLLOW UP: Return in about 1 month (around 08/14/2017) for routine chronic illness f/u (30 min).  Signed:  Crissie Sickles, MD           07/25/2017

## 2017-07-19 ENCOUNTER — Telehealth: Payer: Self-pay | Admitting: Family Medicine

## 2017-07-19 MED ORDER — LOSARTAN POTASSIUM 50 MG PO TABS: 50.0000 mg | ORAL_TABLET | Freq: Every day | ORAL | 3 refills | Status: DC

## 2017-07-19 NOTE — Telephone Encounter (Signed)
Pt advised and voiced understanding.   

## 2017-07-19 NOTE — Telephone Encounter (Signed)
Yes, losartan is good. I sent in losartan 50mg  to take once daily---tell her this will be a good dose for her.-thx

## 2017-07-19 NOTE — Telephone Encounter (Signed)
Copied from [place]. Topic: Quick Communication - Rx Refill/Question >> Jul 19, 2017  1:00 PM Martha Espinoza wrote: Medication: telmisartan (MICARDIS) 20 MG tablet - pt states pharmacy was out of this medicine and estimate is $60+ for 90 day supply - pt states this is too much - they suggested losartan 25mg  instead for $10 per 90 day supply - pt would like to know if this is an option for her?  [place], [place] - [address] [phone] (Phone) [phone] (Fax)

## 2017-07-25 ENCOUNTER — Telehealth: Payer: Self-pay | Admitting: Family Medicine

## 2017-07-25 NOTE — Telephone Encounter (Signed)
SW pt, she stated that her ear is still no better. She stated that it still feels like it is clogged and now has sharpe pains going through it. She is still taking the antibiotic and prednisone.   I advised pt that since her ear has not improved and seems to be getting worse she should come back in for an office visit to be re-evaluated. She voiced understanding. Apt made for 07/26/17 at 2:30pm.

## 2017-07-25 NOTE — Telephone Encounter (Signed)
Copied from [place] [id number]. Topic: Quick Communication - See Telephone Encounter >> Jul 25, 2017 11:01 AM Robina Ade, Helene Kelp D wrote: CRM for notification. See Telephone encounter for: 07/25/17. Patient is having right ear pain and would like to talk to Dr. Anitra Lauth or his CMA about this before making an appt, please call patient back, thanks.

## 2017-07-26 ENCOUNTER — Ambulatory Visit: Payer: Commercial Managed Care - PPO | Admitting: Family Medicine

## 2017-07-26 ENCOUNTER — Encounter: Payer: Self-pay | Admitting: Family Medicine

## 2017-07-26 VITALS — BP 120/85 | HR 101 | Temp 98.2°F | Resp 16 | Ht 62.0 in | Wt 260.2 lb

## 2017-07-26 DIAGNOSIS — H9201 Otalgia, right ear: Secondary | ICD-10-CM | POA: Diagnosis not present

## 2017-07-26 DIAGNOSIS — J0100 Acute maxillary sinusitis, unspecified: Secondary | ICD-10-CM

## 2017-07-26 DIAGNOSIS — J441 Chronic obstructive pulmonary disease with (acute) exacerbation: Secondary | ICD-10-CM

## 2017-07-26 DIAGNOSIS — I10 Essential (primary) hypertension: Secondary | ICD-10-CM | POA: Diagnosis not present

## 2017-07-26 DIAGNOSIS — H6981 Other specified disorders of Eustachian tube, right ear: Secondary | ICD-10-CM | POA: Diagnosis not present

## 2017-07-26 DIAGNOSIS — H65111 Acute and subacute allergic otitis media (mucoid) (sanguinous) (serous), right ear: Secondary | ICD-10-CM | POA: Diagnosis not present

## 2017-07-26 MED ORDER — AMOXICILLIN-POT CLAVULANATE 875-125 MG PO TABS: 1.0000 | ORAL_TABLET | Freq: Two times a day (BID) | ORAL | 0 refills | Status: DC

## 2017-07-26 MED ORDER — FLUCONAZOLE 150 MG PO TABS: ORAL_TABLET | ORAL | 0 refills | Status: DC

## 2017-07-26 MED ORDER — AZELASTINE HCL 0.1 % NA SOLN: 2.0000 | Freq: Two times a day (BID) | NASAL | 6 refills | Status: AC

## 2017-07-26 NOTE — Progress Notes (Signed)
OFFICE VISIT  07/26/2017   CC:  Chief Complaint  Patient presents with  . Ear Pain    right   HPI:    Patient is a 52 y.o. Caucasian female who presents accompanied by her husband for right ear pain. Feeling of clogging, fullness, pain.  Taking amoxil rx'd last visit 9 d/a. No fevers.  R side of face/head feels full, R nostril clogged. Her cough/breathing is improved since taking prednisone I rx'd last visit.  Not requiring any albuterol in several days.    Home bp's: only occ monitoring since I added 50mg  losartan after last visit and these have been 120/80 avg. She feels tired when bp drops below 494 systolic.   Past Medical History:  Diagnosis Date  . Anxiety   . Arthritis   . Bilateral leg pain 2018/19   ? venous reflux.  . Bipolar 1 disorder ([place])   . Bronchitis, mucopurulent recurrent ([place])    PFTs 04/2016 showed no sign of copd or asthma, plus she actually had worse FEV1 after albuterol.  . Chronic pain syndrome    chronic low back pain (Pain mgmt= Dr. [name]).  MRI showed no signif disc dz, only showed some facet arthropathy at L4-5, with joint diastasis at L4-5--plan as of 11/07/16 neurosurg eval is bilat facet injections.  . Colitis due to Clostridium difficile 2001  . GERD (gastroesophageal reflux disease)   . Hepatic hemangioma   . Hepatic steatosis 06/2016   Noted incidentally on CT chest  . History of Salmonella gastroenteritis   . Hyperlipidemia    a. Noted 02/2014.  Marland Kitchen Hypertension    HCTZ started 07/2015  . IBS (irritable bowel syndrome)   . Iron deficiency anemia 05/2014   per pt it is not due to vaginal blood loss; hemoccults sent to pt in mail 413/16.  . Lumbar spondylosis    mild, mainly focused at L4-5 and L5-S1.  . Microscopic hematuria    Intermittent (no w/u done yet, as of 03/2014)  . Migraine headache   . Morbid obesity ([place])   . Multiple lipomas 09/2016   left upper arm--very small ones.  . Peripheral edema Fall 2018   R>L, non-pitting for  the most part--venous doppler neg for DVT 01/2017.  Saw Dr. Bridgett Larsson (Vasc) 04/12/17 and u/s showed some venous reflux dz but he felt her sx's in legs were NOT due to her venous insufficiency OR PAD.  Thigh high comp stockings rx'd.  . Polycystic ovarian syndrome   . Pruritic dermatitis 2015/2016   +Scabies prep at [facility] 07/2014. (Primarily pruritic skin, but eventually a subtle rash as well)  . Rectal cancer ([place])    a. Followed by Dr. Gala Romney, dx 2000-2001. Tumor removed from rectal.  . Recurrent sinusitis    +allergic rhinitis  . Right shoulder pain 2017   Murphy/Wainer: RC bursitis + RC tear (MRI)--injection trial is plan per pt report 05/04/16.  . Tobacco dependence   . Vitamin D deficiency     Past Surgical History:  Procedure Laterality Date  . CARDIOVASCULAR STRESS TEST  03/24/14   Lexscan MIBI: mild anterior ischemia?  Cardiac CT angiogram recommended/done.  . CHOLECYSTECTOMY    . COLONOSCOPY  05/05/2007   adenoma  . coronary CT angio  03/29/14   Two vessel dz/moderate stenosis of mid LAD and proximal RCA; cath recommended.  Marland Kitchen DILATION AND CURETTAGE OF UTERUS     for vaginal bleeding  . JOINT REPLACEMENT    . LEFT HEART CATHETERIZATION WITH  CORONARY ANGIOGRAM N/A 03/31/2014   Normal coronaries, EF 36-14%, diastolic dysfunction.  Procedure: LEFT HEART CATHETERIZATION WITH CORONARY ANGIOGRAM;  Surgeon: Sinclair Grooms, MD;  Location: [facility] CATH LAB;  Service: Cardiovascular;  Laterality: N/A;  . LOWER EXT VENOUS DOPPLERS Bilateral 02/08/2017   NEG for DVT (bilat)  . PFTs  04/2016   No sign of COPD or asthma; FEV 1 worse after albut.  . Situ removed  age 13   High-grade rectal adenoma removed from rectum   . TRANSTHORACIC ECHOCARDIOGRAM  03/23/14   Normal    Outpatient Medications Prior to Visit  Medication Sig Dispense Refill  . albuterol (PROVENTIL HFA) 108 (90 Base) MCG/ACT inhaler as needed    . aspirin 81 MG chewable tablet Chew 1 tablet (81 mg total) by mouth daily.  14 tablet 0  . busPIRone (BUSPAR) 15 MG tablet 1 in the morning and 2 at bedtime    . cetirizine (ZYRTEC) 10 MG tablet TAKE ONE TABLET BY MOUTH ONCE DAILY 30 tablet 11  . clonazePAM (KLONOPIN) 0.5 MG tablet TAKE 1 TABLET BY MOUTH ONCE DAILY DURING THE  DAY AS NEEDED, THEN TAKE 2 TABLETS AT BEDTIME. 90 tablet 5  . cyclobenzaprine (FLEXERIL) 10 MG tablet TAKE ONE TABLET BY MOUTH EVERY 6 HOURS AS NEEDED (SPASM RELATED TO HEADACHES) (Patient taking differently: Take 10 mg by mouth 3 (three) times daily. TAKE ONE TABLET BY MOUTH EVERY 6 HOURS AS NEEDED (SPASM RELATED TO HEADACHES)) 42 tablet 5  . diphenhydrAMINE (BENADRYL) 25 MG tablet Take 1 tablet (25 mg total) by mouth every 6 (six) hours. (Patient taking differently: Take 25-50 mg by mouth every 6 (six) hours as needed for itching or allergies. ) 20 tablet 0  . fluticasone (FLONASE) 50 MCG/ACT nasal spray Place 2 sprays into both nostrils daily. 16 g 1  . hydrOXYzine (ATARAX/VISTARIL) 25 MG tablet Take 25 mg by mouth 3 (three) times daily.     Marland Kitchen IRON-FOLIC ACID PO Take 65 mg by mouth daily.     Marland Kitchen losartan (COZAAR) 50 MG tablet Take 1 tablet (50 mg total) by mouth daily. 30 tablet 3  . Magnesium-Potassium 40-40 MG CAPS Take 1 capsule by mouth daily.    . metoprolol tartrate (LOPRESSOR) 50 MG tablet Take 1 tablet (50 mg total) by mouth 2 (two) times daily. 60 tablet 1  . Naldemedine Tosylate (SYMPROIC) 0.2 MG TABS Take 1 tablet by mouth daily.    . nitroGLYCERIN (NITROSTAT) 0.4 MG SL tablet Place 1 tablet (0.4 mg total) under the tongue every 5 (five) minutes as needed for chest pain. 20 tablet 0  . nystatin (MYCOSTATIN) 100000 UNIT/ML suspension 1 tsp po qid swish, gargle, and spit x 14d 280 mL 0  . Oxcarbazepine (TRILEPTAL) 300 MG tablet Take 150 mg by mouth daily.    Marland Kitchen oxyCODONE-acetaminophen (PERCOCET) 10-325 MG tablet Take 1 tablet by mouth 3 (three) times daily.     . pantoprazole (PROTONIX) 40 MG tablet TAKE ONE TABLET BY MOUTH ONCE DAILY 90  tablet 3  . predniSONE (DELTASONE) 10 MG tablet 4 tabs po qd x 5d, then 2 tabs po qd x 5d, then 1 tab po qd x 5d 35 tablet 0  . pregabalin (LYRICA) 75 MG capsule Take 150 mg by mouth 3 (three) times daily.     . promethazine (PHENERGAN) 25 MG tablet TAKE 1 TABLET BY MOUTH EVERY 6 HOURS AS NEEDED FOR NAUSEA AND VOMITING 30 tablet 3  . ranitidine (ZANTAC) 150 MG tablet  Take 150 mg by mouth 2 (two) times daily.    Marland Kitchen rOPINIRole (REQUIP) 0.25 MG tablet TAKE 3 TABLETS BY MOUTH ONCE DAILY AT BEDTIME 90 tablet 6  . amoxicillin (AMOXIL) 875 MG tablet Take 1 tablet (875 mg total) by mouth 2 (two) times daily for 10 days. 20 tablet 0   No facility-administered medications prior to visit.     Allergies  Allergen Reactions  . Contrast Media [Iodinated Diagnostic Agents] Anaphylaxis    Needs 13-hour prep  . Shellfish Allergy Anaphylaxis  . Codeine Nausea And Vomiting and Other (See Comments)    Hallucinations, Also sees people that are not there   . Furosemide Rash    Rash, soB.  . Lamictal [Lamotrigine] Rash  . Ivermectin Nausea And Vomiting    N/V and rash  . Betadine [Povidone Iodine] Rash  . Latex Hives  . Lipitor [Atorvastatin] Itching  . Permethrin Itching  . Tape Dermatitis    Paper tape and clear    ROS As per HPI  PE: Blood pressure 120/85, pulse (!) 101, temperature 98.2 F (36.8 C), temperature source Oral, resp. rate 16, height 5\' 2"  (1.575 m), weight 260 lb 4 oz (118 kg), last menstrual period 07/17/2017, SpO2 91 %. Gen: alert, appears tired but NAD.  Oriented x 4. Affect: sad and anxious, lucid thought and speech. VS: noted--normal. Gen: alert, NAD, NONTOXIC APPEARING. HEENT: eyes without injection, drainage, or swelling.  Ears: EACs clear, TMs with normal light reflex and landmarks.  Nose: Clear rhinorrhea, with some dried, crusty exudate adherent to mildly injected mucosa.  No purulent d/c.  No paranasal sinus TTP.  No facial swelling.  Throat and mouth without focal  lesion.  No pharyngial swelling, erythema, or exudate.  No mastoid tenderness, swelling, or erythema. Neck: supple, no LAD.   LUNGS: CTA bilat on inspiration, just a trace of exp wheeze, minimally prolonged exp phase, nonlabored resps.   CV: RRR, no m/r/g.  LABS:    Chemistry      Component Value Date/Time   NA 136 05/29/2017 1638   K 4.2 05/29/2017 1638   CL 101 05/29/2017 1638   CO2 22 05/29/2017 1638   BUN 11 05/29/2017 1638   CREATININE 0.71 05/29/2017 1638      Component Value Date/Time   CALCIUM 9.6 05/29/2017 1638   ALKPHOS 71 05/29/2017 1638   AST 21 05/29/2017 1638   ALT 17 05/29/2017 1638   BILITOT 0.9 05/29/2017 1638       IMPRESSION AND PLAN:  1) Right otalgia: infection is clearing.  She likely has a component of sinusitis and eustachian tube dysfxn adding to sx's.  Will d/c amoxil and rx augmentin 875mg  bid. Pt asks for fluconazole for yeast vaginitis that she says she is already seeing/feeling signs of: I rx'd diflucan 150mg  qd x 2d.  2) COPD exac: improving appropriately.  Finish prednisone, use albut q4h prn.  3) HTN: improving on combo of losartan and lopressor.  4) Chronic low back pain, with question of radiculopathy/spinal stenosis sxs': we're working on getting her in for repeat L spine MRI (she needs an open MRI machine).  An After Visit Summary was printed and given to the patient.  FOLLOW UP: Return if symptoms worsen or fail to improve.  Signed:  Crissie Sickles, MD           07/26/2017

## 2017-08-16 ENCOUNTER — Encounter: Payer: Self-pay | Admitting: Family Medicine

## 2017-08-16 ENCOUNTER — Telehealth: Payer: Self-pay | Admitting: Family Medicine

## 2017-08-16 ENCOUNTER — Ambulatory Visit: Payer: Commercial Managed Care - PPO | Admitting: Family Medicine

## 2017-08-16 VITALS — BP 108/73 | HR 110 | Temp 98.2°F | Resp 16 | Ht 62.0 in | Wt 265.2 lb

## 2017-08-16 DIAGNOSIS — M5441 Lumbago with sciatica, right side: Secondary | ICD-10-CM | POA: Diagnosis not present

## 2017-08-16 DIAGNOSIS — G8929 Other chronic pain: Secondary | ICD-10-CM | POA: Diagnosis not present

## 2017-08-16 DIAGNOSIS — I10 Essential (primary) hypertension: Secondary | ICD-10-CM | POA: Diagnosis not present

## 2017-08-16 DIAGNOSIS — J180 Bronchopneumonia, unspecified organism: Secondary | ICD-10-CM | POA: Diagnosis not present

## 2017-08-16 DIAGNOSIS — G9519 Other vascular myelopathies: Secondary | ICD-10-CM

## 2017-08-16 DIAGNOSIS — M5442 Lumbago with sciatica, left side: Secondary | ICD-10-CM | POA: Diagnosis not present

## 2017-08-16 DIAGNOSIS — M48062 Spinal stenosis, lumbar region with neurogenic claudication: Secondary | ICD-10-CM | POA: Diagnosis not present

## 2017-08-16 DIAGNOSIS — R29818 Other symptoms and signs involving the nervous system: Secondary | ICD-10-CM

## 2017-08-16 DIAGNOSIS — J441 Chronic obstructive pulmonary disease with (acute) exacerbation: Secondary | ICD-10-CM

## 2017-08-16 MED ORDER — ALBUTEROL SULFATE HFA 108 (90 BASE) MCG/ACT IN AERS: 1.0000 | INHALATION_SPRAY | RESPIRATORY_TRACT | 3 refills | Status: DC | PRN

## 2017-08-16 MED ORDER — PREDNISONE 20 MG PO TABS: ORAL_TABLET | ORAL | 0 refills | Status: DC

## 2017-08-16 MED ORDER — ALBUTEROL SULFATE (2.5 MG/3ML) 0.083% IN NEBU: 2.5000 mg | INHALATION_SOLUTION | Freq: Four times a day (QID) | RESPIRATORY_TRACT | 6 refills | Status: DC | PRN

## 2017-08-16 MED ORDER — BUDESONIDE-FORMOTEROL FUMARATE 160-4.5 MCG/ACT IN AERO: 2.0000 | INHALATION_SPRAY | Freq: Two times a day (BID) | RESPIRATORY_TRACT | 6 refills | Status: DC

## 2017-08-16 MED ORDER — FLUCONAZOLE 150 MG PO TABS: ORAL_TABLET | ORAL | 2 refills | Status: DC

## 2017-08-16 MED ORDER — LEVOFLOXACIN 500 MG PO TABS: 500.0000 mg | ORAL_TABLET | Freq: Every day | ORAL | 0 refills | Status: DC

## 2017-08-16 NOTE — Telephone Encounter (Signed)
Copied from [place] [id number]. Topic: Quick Communication - See Telephone Encounter >> Aug 16, 2017  4:19 PM Rutherford Nail, [place] wrote: CRM for notification. See Telephone encounter for: 08/16/17. Patient calling and states that the budesonide-formoterol (SYMBICORT) 160-4.5 MCG/ACT inhaler was going to be $104 after insurance. Patient would like an alternative sent to the pharmacy, if possible. CB#:[phone] [place], [place] [address]

## 2017-08-16 NOTE — Progress Notes (Signed)
OFFICE VISIT  08/19/2017   CC:  Chief Complaint  Patient presents with  . Follow-up    RCI   HPI:    Patient is a 52 y.o. Caucasian female who presents accompanied by her husband for f/u COPD, HTN, chronic LBP with bilat LL paresthesias and weakness.  Her chronic sinus sx's and chronic bronchitis are compounded in severity due to her ongoing tobacco abuse.  Last visit 3 wks ago I treated her with abx for AOM and with prednisone for acute exac of COPD. She is only a little better at this time--finished abx and steroids and is taking albut nebs bid.  Most recent albuterol was via HFA about 2 hours ago.  Taking tylenol severe cold formula.   Feels overall about 30% better at this time from a resp standpoint. She had daily fever up to 102 this week.  I added cozaar 50mg  qd to her bp treatment regimen.  BPs consistently normal, a few on low normal side and a couple in 80s/40 region.  She felt weak when bp this low.  She is drinking lots of fluids. Some chronic nausea, but no vomiting or diarrhea.  I ordered a repeat L spine MRI due to her ongoing LBP with paresthesias and bilat leg weakness.  Also has pain that intermittently shoots down her back all the way down a leg---either leg sometimes.  Sx's in legs all get worse when she ambulates, gets back to baseline when she rests.  Most comfortable position when standing up is leaning over on the counter. MRI's of lumbar spine have shown only mild spondylosis without sign of nerve impingement or spinal stenosis. I referred her to Dr. Aline Brochure, orthopedist, in 03/2015, for this pain.  I then referred her to Dr. [name], pain specialist/neurologist, 12/2015 for this. This office has done neck imaging as well as neck and LB steroid injections---none helped at all. She has also had chronic neck pain with radiation down R arm and tingling and numbness down R arm to just below the elbow.  This radiculopathy is on/off.  Recently started Latuda for  mood stabilizer and says it has helped some, no side effects. Has psychiatrist f/u in 1 wk.   Past Medical History:  Diagnosis Date  . Anxiety   . Arthritis   . Bilateral leg pain 2018/19   ? venous reflux.  . Bipolar 1 disorder ([place])   . Bronchitis, mucopurulent recurrent ([place])    PFTs 04/2016 showed no sign of copd or asthma, plus she actually had worse FEV1 after albuterol.  . Chronic pain syndrome    chronic low back pain (Pain mgmt= Dr. [name]).  MRI showed no signif disc dz, only showed some facet arthropathy at L4-5, with joint diastasis at L4-5--plan as of 11/07/16 neurosurg eval is bilat facet injections.  . Colitis due to Clostridium difficile 2001  . GERD (gastroesophageal reflux disease)   . Hepatic hemangioma   . Hepatic steatosis 06/2016   Noted incidentally on CT chest  . History of Salmonella gastroenteritis   . Hyperlipidemia    a. Noted 02/2014.  Marland Kitchen Hypertension    HCTZ started 07/2015  . IBS (irritable bowel syndrome)   . Iron deficiency anemia 05/2014   per pt it is not due to vaginal blood loss; hemoccults sent to pt in mail 413/16.  . Lumbar spondylosis    mild, mainly focused at L4-5 and L5-S1.  . Microscopic hematuria    Intermittent (no w/u done yet, as of 03/2014)  .  Migraine headache   . Morbid obesity ([place])   . Multiple lipomas 09/2016   left upper arm--very small ones.  . Peripheral edema Fall 2018   R>L, non-pitting for the most part--venous doppler neg for DVT 01/2017.  Saw Dr. Bridgett Larsson (Vasc) 04/12/17 and u/s showed some venous reflux dz but he felt her sx's in legs were NOT due to her venous insufficiency OR PAD.  Thigh high comp stockings rx'd.  . Polycystic ovarian syndrome   . Pruritic dermatitis 2015/2016   +Scabies [facility] at [facility] 07/2014. (Primarily pruritic skin, but eventually a subtle rash as well)  . Rectal cancer ([place])    a. Followed by Dr. Gala Romney, dx 2000-2001. Tumor removed from rectal.  . Recurrent sinusitis    +allergic  rhinitis  . Right shoulder pain 2017   [facility]: RC bursitis + RC tear (MRI)--injection trial is plan per pt report 05/04/16.  . Tobacco dependence   . Vitamin D deficiency     Past Surgical History:  Procedure Laterality Date  . CARDIOVASCULAR STRESS TEST  03/24/14   Lexscan MIBI: mild anterior ischemia?  Cardiac CT angiogram recommended/done.  . CHOLECYSTECTOMY    . COLONOSCOPY  05/05/2007   adenoma  . coronary CT angio  03/29/14   Two vessel dz/moderate stenosis of mid LAD and proximal RCA; cath recommended.  Marland Kitchen DILATION AND CURETTAGE OF UTERUS     for vaginal bleeding  . JOINT REPLACEMENT    . LEFT HEART CATHETERIZATION WITH CORONARY ANGIOGRAM N/A 03/31/2014   Normal coronaries, EF 77-41%, diastolic dysfunction.  Procedure: LEFT HEART CATHETERIZATION WITH CORONARY ANGIOGRAM;  Surgeon: Sinclair Grooms, MD;  Location: [facility] CATH LAB;  Service: Cardiovascular;  Laterality: N/A;  . LOWER EXT VENOUS DOPPLERS Bilateral 02/08/2017   NEG for DVT (bilat)  . PFTs  04/2016   No sign of COPD or asthma; FEV 1 worse after albut.  . Situ removed  age 64   High-grade rectal adenoma removed from rectum   . TRANSTHORACIC ECHOCARDIOGRAM  03/23/14   Normal    Outpatient Medications Prior to Visit  Medication Sig Dispense Refill  . aspirin 81 MG chewable tablet Chew 1 tablet (81 mg total) by mouth daily. 14 tablet 0  . azelastine (ASTELIN) 0.1 % nasal spray Place 2 sprays into both nostrils 2 (two) times daily. 30 mL 6  . busPIRone (BUSPAR) 15 MG tablet 1 in the morning and 2 at bedtime    . cetirizine (ZYRTEC) 10 MG tablet TAKE ONE TABLET BY MOUTH ONCE DAILY 30 tablet 11  . clonazePAM (KLONOPIN) 0.5 MG tablet TAKE 1 TABLET BY MOUTH ONCE DAILY DURING THE  DAY AS NEEDED, THEN TAKE 2 TABLETS AT BEDTIME. 90 tablet 5  . cyclobenzaprine (FLEXERIL) 10 MG tablet TAKE ONE TABLET BY MOUTH EVERY 6 HOURS AS NEEDED (SPASM RELATED TO HEADACHES) (Patient taking differently: Take 10 mg by mouth 3 (three) times  daily. TAKE ONE TABLET BY MOUTH EVERY 6 HOURS AS NEEDED (SPASM RELATED TO HEADACHES)) 42 tablet 5  . diphenhydrAMINE (BENADRYL) 25 MG tablet Take 1 tablet (25 mg total) by mouth every 6 (six) hours. (Patient taking differently: Take 25-50 mg by mouth every 6 (six) hours as needed for itching or allergies. ) 20 tablet 0  . fluticasone (FLONASE) 50 MCG/ACT nasal spray Place 2 sprays into both nostrils daily. 16 g 1  . hydrOXYzine (ATARAX/VISTARIL) 25 MG tablet Take 25 mg by mouth 3 (three) times daily.     Marland Kitchen IRON-FOLIC ACID  PO Take 65 mg by mouth daily.     Marland Kitchen LATUDA 20 MG TABS tablet Take 1 tablet by mouth daily.  0  . losartan (COZAAR) 50 MG tablet Take 1 tablet (50 mg total) by mouth daily. 30 tablet 3  . LYRICA 150 MG capsule Take 1 tablet by mouth 3 (three) times daily.  2  . Magnesium-Potassium 40-40 MG CAPS Take 1 capsule by mouth daily.    . metoprolol tartrate (LOPRESSOR) 50 MG tablet Take 1 tablet (50 mg total) by mouth 2 (two) times daily. 60 tablet 1  . Naldemedine Tosylate (SYMPROIC) 0.2 MG TABS Take 1 tablet by mouth daily.    . nitroGLYCERIN (NITROSTAT) 0.4 MG SL tablet Place 1 tablet (0.4 mg total) under the tongue every 5 (five) minutes as needed for chest pain. 20 tablet 0  . nystatin (MYCOSTATIN) 100000 UNIT/ML suspension 1 tsp po qid swish, gargle, and spit x 14d 280 mL 0  . Oxcarbazepine (TRILEPTAL) 300 MG tablet Take 150 mg by mouth daily.    Marland Kitchen oxyCODONE-acetaminophen (PERCOCET) 10-325 MG tablet Take 1 tablet by mouth 3 (three) times daily.     . pantoprazole (PROTONIX) 40 MG tablet TAKE ONE TABLET BY MOUTH ONCE DAILY 90 tablet 3  . promethazine (PHENERGAN) 25 MG tablet TAKE 1 TABLET BY MOUTH EVERY 6 HOURS AS NEEDED FOR NAUSEA AND VOMITING 30 tablet 3  . ranitidine (ZANTAC) 150 MG tablet Take 150 mg by mouth 2 (two) times daily.    Marland Kitchen rOPINIRole (REQUIP) 0.25 MG tablet TAKE 3 TABLETS BY MOUTH ONCE DAILY AT BEDTIME 90 tablet 6  . albuterol (PROVENTIL HFA) 108 (90 Base) MCG/ACT  inhaler as needed    . amoxicillin-clavulanate (AUGMENTIN) 875-125 MG tablet Take 1 tablet by mouth 2 (two) times daily. (Patient not taking: Reported on 08/16/2017) 20 tablet 0  . fluconazole (DIFLUCAN) 150 MG tablet 1 tab po qd x 2 days (Patient not taking: Reported on 08/16/2017) 2 tablet 0  . predniSONE (DELTASONE) 10 MG tablet 4 tabs po qd x 5d, then 2 tabs po qd x 5d, then 1 tab po qd x 5d (Patient not taking: Reported on 08/16/2017) 35 tablet 0  . pregabalin (LYRICA) 75 MG capsule Take 150 mg by mouth 3 (three) times daily.      No facility-administered medications prior to visit.     Allergies  Allergen Reactions  . Contrast Media [Iodinated Diagnostic Agents] Anaphylaxis    Needs 13-hour prep  . Shellfish Allergy Anaphylaxis  . Codeine Nausea And Vomiting and Other (See Comments)    Hallucinations, Also sees people that are not there   . Furosemide Rash    Rash, soB.  . Lamictal [Lamotrigine] Rash  . Ivermectin Nausea And Vomiting    N/V and rash  . Betadine [Povidone Iodine] Rash  . Latex Hives  . Lipitor [Atorvastatin] Itching  . Permethrin Itching  . Tape Dermatitis    Paper tape and clear    ROS As per HPI  PE: Blood pressure 108/73, pulse (!) 110, temperature 98.2 F (36.8 C), temperature source Oral, resp. rate 16, height 5\' 2"  (1.575 m), weight 265 lb 4 oz (120.3 kg), last menstrual period 07/17/2017, SpO2 94 %. Gen: Alert, well appearing.  Patient is oriented to person, place, time, and situation. AFFECT: pleasant with anxious and at times she is morose, lucid thought and speech. IHK:VQQV: no injection, icteris, swelling, or exudate.  EOMI, PERRLA. Mouth: lips without lesion/swelling.  Oral mucosa pink and moist. Oropharynx  without erythema, exudate, or swelling.  Neck - No masses or thyromegaly or limitation in range of motion CV: RRR, no m/r/g.   LUNGS: CTA bilat except some insp crackles in L base, with trace diffuse exp wheeze---decent aeration, nonlabored  resps, good aeration in all lung fields. ABD: soft, NT/ND EXT: bilat nonpitting edema with some varicosities and spider veins---none look inflamed. Neuro: CN 2-12 intact bilaterally, strength 5/5 in proximal and distal upper extremities and lower extremities bilaterally.  No sensory deficits.  No tremor.  Slight diminished capacity to do FNF smoothly ith L hand.  No ataxia.  Upper extremity and lower extremity DTRs symmetric.  No pronator drift.   LABS:    Chemistry      Component Value Date/Time   NA 136 05/29/2017 1638   K 4.2 05/29/2017 1638   CL 101 05/29/2017 1638   CO2 22 05/29/2017 1638   BUN 11 05/29/2017 1638   CREATININE 0.71 05/29/2017 1638      Component Value Date/Time   CALCIUM 9.6 05/29/2017 1638   ALKPHOS 71 05/29/2017 1638   AST 21 05/29/2017 1638   ALT 17 05/29/2017 1638   BILITOT 0.9 05/29/2017 1638     Lab Results  Component Value Date   HGBA1C 5.9 (H) 03/23/2014    IMPRESSION AND PLAN:  1) COPD exacerbation, with exam suspicious for L basilar infiltrate, high risk pt.  Recurrent episodes of COPD--and apparently we have only tried one controller med (bevespi) and she is not really sure why she did not take it (?cost). Start symbicort 2p bid, continue albut q4h prn via neb or HFA. Prednisone 40 q x 5, 20 qd x 5, 10 qd x 6. Levaquin 500 mg qd x 10d. Diflucan rx'd for the usual yeast infections she gets with abx. Therapeutic expectations and side effect profile of medication discussed today.  Patient's questions answered.  2)  Chronic LBP with possible neurogenic claudication. Ordered MRI L spine---needs open MRI--ordered for [facility] in [place].  3) HTN: The current medical regimen is effective;  continue present plan and medications.  [facility]--MRI L spine ordered to eval for possible spinal stenosis w/neurogenic claudication.  An After Visit Summary was printed and given to the patient.  FOLLOW UP: Return in about 2 weeks (around 08/30/2017) for  f/u COPD/LB pain/legs sx's.  Signed:  Crissie Sickles, MD           08/19/2017

## 2017-08-16 NOTE — Telephone Encounter (Signed)
Patient instructed to call insurance company to find out which medication they will approve and for cost she can afford.

## 2017-08-23 ENCOUNTER — Other Ambulatory Visit: Payer: Self-pay | Admitting: Family Medicine

## 2017-08-27 ENCOUNTER — Telehealth: Payer: Self-pay

## 2017-08-27 NOTE — Telephone Encounter (Signed)
I strongly agree that she does NOT need to change the latuda.-thx

## 2017-08-27 NOTE — Telephone Encounter (Signed)
SW Dr. Anitra Lauth, pt does not need to be contacted.

## 2017-08-27 NOTE — Telephone Encounter (Signed)
Martha Espinoza with [facility] called to report pt. Reported leg swelling as a possible side effect of her Latuda. She is having good results with [place] - Ms. Ouida Sills does not want to change the medication.Wnats Dr. Anitra Lauth aware.

## 2017-08-30 ENCOUNTER — Encounter: Payer: Self-pay | Admitting: Family Medicine

## 2017-08-30 ENCOUNTER — Telehealth: Payer: Self-pay | Admitting: Family Medicine

## 2017-08-30 ENCOUNTER — Ambulatory Visit: Payer: Commercial Managed Care - PPO | Admitting: Family Medicine

## 2017-08-30 VITALS — BP 119/75 | HR 87 | Temp 98.3°F | Resp 24 | Ht 62.0 in | Wt 267.2 lb

## 2017-08-30 DIAGNOSIS — E662 Morbid (severe) obesity with alveolar hypoventilation: Secondary | ICD-10-CM

## 2017-08-30 DIAGNOSIS — M5416 Radiculopathy, lumbar region: Secondary | ICD-10-CM

## 2017-08-30 DIAGNOSIS — J449 Chronic obstructive pulmonary disease, unspecified: Secondary | ICD-10-CM

## 2017-08-30 DIAGNOSIS — G9519 Other vascular myelopathies: Secondary | ICD-10-CM

## 2017-08-30 DIAGNOSIS — R600 Localized edema: Secondary | ICD-10-CM | POA: Diagnosis not present

## 2017-08-30 DIAGNOSIS — G8929 Other chronic pain: Secondary | ICD-10-CM | POA: Diagnosis not present

## 2017-08-30 DIAGNOSIS — M48062 Spinal stenosis, lumbar region with neurogenic claudication: Secondary | ICD-10-CM | POA: Diagnosis not present

## 2017-08-30 MED ORDER — FLUTICASONE PROPIONATE HFA 110 MCG/ACT IN AERO: 2.0000 | INHALATION_SPRAY | Freq: Two times a day (BID) | RESPIRATORY_TRACT | 12 refills | Status: DC

## 2017-08-30 MED ORDER — BECLOMETHASONE DIPROP HFA 80 MCG/ACT IN AERB: 1.0000 | INHALATION_SPRAY | Freq: Two times a day (BID) | RESPIRATORY_TRACT | 6 refills | Status: DC

## 2017-08-30 NOTE — Telephone Encounter (Signed)
OK, sent in QVAR inhaler.

## 2017-08-30 NOTE — Telephone Encounter (Signed)
Please advise. Thanks.  

## 2017-08-30 NOTE — Progress Notes (Signed)
OFFICE VISIT  09/01/2017   CC:  Chief Complaint  Patient presents with  . Follow-up    COPD and Leg Pain   HPI:    Patient is a 52 y.o. Caucasian female who presents for 2 week f/u acute bronchopneumonia/copd exacerbation. Levaquin x 10d and 16d prednisone taper rx'd at that time.  Also started symbicort but this was cost prohibitive so pt did not get this.  Says she is better but still with some cough and wheeze and fatigue--essentially at her baseline.   Also, has chronic LBP with suspected neurogenic claudication. I ordered an MRI L spine at her most recent visit here 2 wks ago ([facility]--open scanner).  Interim Hx: Increased legs swelling has been noted since starting Latuda a few weeks ago, but pt has chosen to continue this med b/c she is feeling psych benefit from it.  She feels more motivated and is less irritable/angry, but is not feeling like the depression has lifted much.  She has been on this for < 64mo. The LE swelling fluctuates, pt states she feels it sometimes up above the knee level.  Still with no change in chronic low back pain with intermittent sharp radiating pain down either leg, with chronic bilat LL pain with ambulation.  Leg pains get a lot better when leaning over on a counter or when she completely stops and gets off legs.  Lungs: feels improved but basically still has her chronic/baseline DOE, wheeze, occ cough-->all worse "the instant I go outside".  No fevers.  Is almost finished with prednisone taper.  She continues to smoke--this is her emotional/psychological crutch and she won't give it up.     Past Medical History:  Diagnosis Date  . Anxiety   . Arthritis   . Bilateral leg pain 2018/19   ? venous reflux.  . Bipolar 1 disorder ([place])   . Bronchitis, mucopurulent recurrent ([place])    PFTs 04/2016 showed no sign of copd or asthma, plus she actually had worse FEV1 after albuterol.  . Chronic pain syndrome    chronic low back pain (Pain mgmt= Dr.  [name]).  MRI showed no signif disc dz, only showed some facet arthropathy at L4-5, with joint diastasis at L4-5--plan as of 11/07/16 neurosurg eval is bilat facet injections.  . Colitis due to Clostridium difficile 2001  . GERD (gastroesophageal reflux disease)   . Hepatic hemangioma   . Hepatic steatosis 06/2016   Noted incidentally on CT chest  . History of Salmonella gastroenteritis   . Hyperlipidemia    a. Noted 02/2014.  Marland Kitchen Hypertension    HCTZ started 07/2015  . IBS (irritable bowel syndrome)   . Iron deficiency anemia 05/2014   per pt it is not due to vaginal blood loss; hemoccults sent to pt in mail 413/16.  . Lumbar spondylosis    mild, mainly focused at L4-5 and L5-S1.  . Microscopic hematuria    Intermittent (no w/u done yet, as of 03/2014)  . Migraine headache   . Morbid obesity ([place])   . Multiple lipomas 09/2016   left upper arm--very small ones.  . Peripheral edema Fall 2018   R>L, non-pitting for the most part--venous doppler neg for DVT 01/2017.  Saw Dr. Bridgett Larsson (Vasc) 04/12/17 and u/s showed some venous reflux dz but he felt her sx's in legs were NOT due to her venous insufficiency OR PAD.  Thigh high comp stockings rx'd.  . Polycystic ovarian syndrome   . Pruritic dermatitis 2015/2016   +Scabies  prep at [facility] 07/2014. (Primarily pruritic skin, but eventually a subtle rash as well)  . Rectal cancer ([place])    a. Followed by Dr. Gala Romney, dx 2000-2001. Tumor removed from rectal.  . Recurrent sinusitis    +allergic rhinitis  . Right shoulder pain 2017   Murphy/Wainer: RC bursitis + RC tear (MRI)--injection trial is plan per pt report 05/04/16.  . Tobacco dependence   . Vitamin D deficiency     Past Surgical History:  Procedure Laterality Date  . CARDIOVASCULAR STRESS TEST  03/24/14   Lexscan MIBI: mild anterior ischemia?  Cardiac CT angiogram recommended/done.  . CHOLECYSTECTOMY    . COLONOSCOPY  05/05/2007   adenoma  . coronary CT angio  03/29/14   Two vessel  dz/moderate stenosis of mid LAD and proximal RCA; cath recommended.  Marland Kitchen DILATION AND CURETTAGE OF UTERUS     for vaginal bleeding  . JOINT REPLACEMENT    . LEFT HEART CATHETERIZATION WITH CORONARY ANGIOGRAM N/A 03/31/2014   Normal coronaries, EF 40-97%, diastolic dysfunction.  Procedure: LEFT HEART CATHETERIZATION WITH CORONARY ANGIOGRAM;  Surgeon: Sinclair Grooms, MD;  Location: [facility] CATH LAB;  Service: Cardiovascular;  Laterality: N/A;  . LOWER EXT VENOUS DOPPLERS Bilateral 02/08/2017   NEG for DVT (bilat)  . PFTs  04/2016   No sign of COPD or asthma; FEV 1 worse after albut.  . Situ removed  age 56   High-grade rectal adenoma removed from rectum   . TRANSTHORACIC ECHOCARDIOGRAM  03/23/14   Normal    Outpatient Medications Prior to Visit  Medication Sig Dispense Refill  . albuterol (PROVENTIL HFA) 108 (90 Base) MCG/ACT inhaler Inhale 1-2 puffs into the lungs every 4 (four) hours as needed. 1 Inhaler 3  . albuterol (PROVENTIL) (2.5 MG/3ML) 0.083% nebulizer solution Take 3 mLs (2.5 mg total) by nebulization every 6 (six) hours as needed for wheezing or shortness of breath. 75 mL 6  . aspirin 81 MG chewable tablet Chew 1 tablet (81 mg total) by mouth daily. 14 tablet 0  . azelastine (ASTELIN) 0.1 % nasal spray Place 2 sprays into both nostrils 2 (two) times daily. 30 mL 6  . busPIRone (BUSPAR) 15 MG tablet 1 in the morning and 2 at bedtime    . cetirizine (ZYRTEC) 10 MG tablet TAKE ONE TABLET BY MOUTH ONCE DAILY 30 tablet 11  . clonazePAM (KLONOPIN) 0.5 MG tablet TAKE 1 TABLET BY MOUTH ONCE DAILY DURING THE  DAY AS NEEDED, THEN TAKE 2 TABLETS AT BEDTIME. 90 tablet 5  . cyclobenzaprine (FLEXERIL) 10 MG tablet TAKE ONE TABLET BY MOUTH EVERY 6 HOURS AS NEEDED (SPASM RELATED TO HEADACHES) (Patient taking differently: Take 10 mg by mouth 3 (three) times daily. TAKE ONE TABLET BY MOUTH EVERY 6 HOURS AS NEEDED (SPASM RELATED TO HEADACHES)) 42 tablet 5  . diphenhydrAMINE (BENADRYL) 25 MG tablet Take 1  tablet (25 mg total) by mouth every 6 (six) hours. (Patient taking differently: Take 25-50 mg by mouth every 6 (six) hours as needed for itching or allergies. ) 20 tablet 0  . fluticasone (FLONASE) 50 MCG/ACT nasal spray Place 2 sprays into both nostrils daily. 16 g 1  . hydrOXYzine (ATARAX/VISTARIL) 25 MG tablet Take 25 mg by mouth 3 (three) times daily.     Marland Kitchen IRON-FOLIC ACID PO Take 65 mg by mouth daily.     Marland Kitchen LATUDA 20 MG TABS tablet Take 1 tablet by mouth daily.  0  . losartan (COZAAR) 50 MG tablet  Take 1 tablet (50 mg total) by mouth daily. 30 tablet 3  . LYRICA 150 MG capsule Take 1 tablet by mouth 3 (three) times daily.  2  . Magnesium-Potassium 40-40 MG CAPS Take 1 capsule by mouth daily.    . metoprolol tartrate (LOPRESSOR) 50 MG tablet TAKE 1 TABLET BY MOUTH TWICE DAILY 60 tablet 1  . Naldemedine Tosylate (SYMPROIC) 0.2 MG TABS Take 1 tablet by mouth daily.    . nitroGLYCERIN (NITROSTAT) 0.4 MG SL tablet Place 1 tablet (0.4 mg total) under the tongue every 5 (five) minutes as needed for chest pain. 20 tablet 0  . nystatin (MYCOSTATIN) 100000 UNIT/ML suspension 1 tsp po qid swish, gargle, and spit x 14d 280 mL 0  . Oxcarbazepine (TRILEPTAL) 300 MG tablet Take 150 mg by mouth daily.    Marland Kitchen oxyCODONE-acetaminophen (PERCOCET) 10-325 MG tablet Take 1 tablet by mouth 3 (three) times daily.     . pantoprazole (PROTONIX) 40 MG tablet TAKE ONE TABLET BY MOUTH ONCE DAILY 90 tablet 3  . promethazine (PHENERGAN) 25 MG tablet TAKE 1 TABLET BY MOUTH EVERY 6 HOURS AS NEEDED FOR NAUSEA AND VOMITING 30 tablet 3  . ranitidine (ZANTAC) 150 MG tablet Take 150 mg by mouth 2 (two) times daily.    Marland Kitchen rOPINIRole (REQUIP) 0.25 MG tablet TAKE 3 TABLETS BY MOUTH ONCE DAILY AT BEDTIME 90 tablet 6  . budesonide-formoterol (SYMBICORT) 160-4.5 MCG/ACT inhaler Inhale 2 puffs into the lungs 2 (two) times daily. (Patient not taking: Reported on 08/30/2017) 1 Inhaler 6  . fluconazole (DIFLUCAN) 150 MG tablet 1 tab po qd x 2  days (Patient not taking: Reported on 08/30/2017) 2 tablet 2  . levofloxacin (LEVAQUIN) 500 MG tablet Take 1 tablet (500 mg total) by mouth daily. (Patient not taking: Reported on 08/30/2017) 10 tablet 0  . predniSONE (DELTASONE) 20 MG tablet 2 tabs po qd x 3d, then 1 tab po qd x 3d, then 1/2 tab po qd x 4d (Patient not taking: Reported on 08/30/2017) 11 tablet 0   No facility-administered medications prior to visit.     Allergies  Allergen Reactions  . Contrast Media [Iodinated Diagnostic Agents] Anaphylaxis    Needs 13-hour prep  . Shellfish Allergy Anaphylaxis  . Codeine Nausea And Vomiting and Other (See Comments)    Hallucinations, Also sees people that are not there   . Furosemide Rash    Rash, soB.  . Lamictal [Lamotrigine] Rash  . Ivermectin Nausea And Vomiting    N/V and rash  . Betadine [Povidone Iodine] Rash  . Latex Hives  . Lipitor [Atorvastatin] Itching  . Permethrin Itching  . Tape Dermatitis    Paper tape and clear    ROS As per HPI  PE: Blood pressure 119/75, pulse 87, temperature 98.3 F (36.8 C), temperature source Oral, resp. rate (!) 24, height 5\' 2"  (1.575 m), weight 267 lb 4 oz (121.2 kg), last menstrual period 08/15/2017, SpO2 93 %. Body mass index is 48.88 kg/m.  Gen: alert, tired appearing but pleasant and in NAD. CV: RRR, no m/r/g. LUNGS: clear on inspiration.  Diffuse mid to end exp wheeze diffusely with prolonged exp phase. nonlabored resps.   EXT: 1+ pitting L LE from knee down, 1-2+ pitting R LL from knee down.  DP and PT pulses 2+ bilat. No cyanosis, no clubbing.  LABS:    Chemistry      Component Value Date/Time   NA 136 05/29/2017 1638   K 4.2 05/29/2017 1638  CL 101 05/29/2017 1638   CO2 22 05/29/2017 1638   BUN 11 05/29/2017 1638   CREATININE 0.71 05/29/2017 1638      Component Value Date/Time   CALCIUM 9.6 05/29/2017 1638   ALKPHOS 71 05/29/2017 1638   AST 21 05/29/2017 1638   ALT 17 05/29/2017 1638   BILITOT 0.9 05/29/2017  1638     Lab Results  Component Value Date   HGBA1C 5.9 (H) 03/23/2014   Lab Results  Component Value Date   DDIMER <0.27 05/20/2017    VAS [place] Bilat LE venous reflux: Final Interpretation: Right: Abnormal reflux times were noted in the great saphenous vein at the mid thigh. Left: Abnormal reflux times were noted in the common femoral vein, great saphenous vein at the groin, great saphenous vein at the proximal thigh, great saphenous vein at the mid thigh, and great saphenous vein at the mid calf. There is no evidence of  deep vein thrombosis in the lower extremity.  IMPRESSION AND PLAN:  1) COPD + obesity hypoventilation syndrome.   OSA-possibly but pt states she could never get through a sleep study b/c of fear/anxiety. Will do overnight oximetry testing. Add flovent hfa 110 mcg, 2 p bid since insurance is saying no to symbicort and "no alternative to symbicort" per their report to pt. Encouraged smoking cessation and wt loss.  2) Bilat leg pains:  LE venous dopplers have shown no DVT but have shown some venous reflux. Will look first at MRI L spine to see if spinal nerve impingement or spinal stenosis occurring---causing neurogenic claudication. If no sign of this on MRI, next I'll get ABIs/LE arterial dopplers. If this is neg, then will refer her to vein clinic on [address] for 2nd opinion on her venous insuff/reflux.  3) Bilat leg swelling: Question contribution of RHF (from Pulm HTN due to COPD/OHS) to her venous insufficiency. Suspect she has OSA but she cannot and will not undergo a sleep study. Considering check of echocardiogram. (LE arterial eval?? (ABI w/arterial dopplers).  An After Visit Summary was printed and given to the patient.  FOLLOW UP: Return in about 2 weeks (around 09/13/2017) for routine chronic illness f/u (30 min).  Signed:  Crissie Sickles, MD           09/01/2017

## 2017-08-30 NOTE — Telephone Encounter (Signed)
Copied from [place] [phone]. Topic: Quick Communication - See Telephone Encounter >> Aug 30, 2017  4:50 PM Percell Belt A wrote: CRM for notification. See Telephone encounter for: 08/30/17.   Pt called in and stated that the inhaler fluticasone (FLOVENT HFA) 110 MCG/ACT inhaler [[id number]] that was just called in is was $75.00.  She stated she is not able to afford that?  She said that ins compy told her to tell dr to keep calling in inhalers until she found one she could afford?    Please advise

## 2017-09-02 NOTE — Telephone Encounter (Signed)
Pt called and states that the QVAR is $72, she cannot afford, pt has contacted insurance and the insurance told pt to have pcp to continue to call in Rx's until the cheapest if found; pt also talked about depleted energy and is worried about that, call pt to advise

## 2017-09-02 NOTE — Telephone Encounter (Signed)
Please advise. Thanks.  

## 2017-09-04 ENCOUNTER — Other Ambulatory Visit: Payer: Self-pay | Admitting: Family Medicine

## 2017-09-04 MED ORDER — BUDESONIDE 1 MG/2ML IN SUSP: 1.0000 mg | Freq: Every day | RESPIRATORY_TRACT | 12 refills | Status: DC

## 2017-09-04 NOTE — Telephone Encounter (Signed)
I'm going to try pulmicort nebulizer solution. Reassure pt that this will not REPLACE her albuterol nebs that she takes every 6 hours as needed. This (pulmicort) will be taken EVERY DAY, once a day (if her insurance doesn't shoot it down).-thx.

## 2017-09-04 NOTE — Telephone Encounter (Signed)
Pt advised and voiced understanding.   

## 2017-09-04 NOTE — Telephone Encounter (Signed)
Pt calling back about a RX for an inhaler the cheapest one that you can prescribe for her

## 2017-09-09 DIAGNOSIS — M5136 Other intervertebral disc degeneration, lumbar region: Secondary | ICD-10-CM | POA: Diagnosis not present

## 2017-09-09 DIAGNOSIS — M5416 Radiculopathy, lumbar region: Secondary | ICD-10-CM | POA: Diagnosis not present

## 2017-09-09 DIAGNOSIS — M47816 Spondylosis without myelopathy or radiculopathy, lumbar region: Secondary | ICD-10-CM | POA: Diagnosis not present

## 2017-09-10 DIAGNOSIS — G603 Idiopathic progressive neuropathy: Secondary | ICD-10-CM | POA: Diagnosis not present

## 2017-09-10 DIAGNOSIS — M79606 Pain in leg, unspecified: Secondary | ICD-10-CM | POA: Diagnosis not present

## 2017-09-10 DIAGNOSIS — R252 Cramp and spasm: Secondary | ICD-10-CM | POA: Diagnosis not present

## 2017-09-11 ENCOUNTER — Telehealth: Payer: Self-pay | Admitting: Family Medicine

## 2017-09-11 ENCOUNTER — Encounter: Payer: Self-pay | Admitting: Family Medicine

## 2017-09-11 NOTE — Telephone Encounter (Signed)
MRI of low back showed just some mild degenerative disc changes, without any narrowing of her spinal canal and no compression of spinal nerves.  This is good.  Her back is not leading to any leg pains or weakness or swelling. Regarding her leg pains, the next step we discussed was getting arterial evaluation, so I will order ABIs and LE arterial dopplers--[facility] ok with pt?  Also, she has a cyst in her left kidney that has grown some over the last few years.  It was first noted on a CT scan in 2016 but appeared smaller and consistent with a simple cyst---NOT suspicious for cancer. However, since it has grown some, we should do the appropriate imaging test to get better clarification and make sure it is just a simple cyst.  This would be an ultrasound of her kidney. Reassure her that this cyst is NOT a cause of any pain or swelling. If she is agreeable to the ultrasound let me know and I'll order it Deneise Lever [place]?  [facility]? ?).   -thx

## 2017-09-12 NOTE — Telephone Encounter (Signed)
I advised pt of her MRI results/findings. She had a lot of questions and would prefer to discuss this further with Dr. Anitra Lauth tomorrow when she comes in for her appointment at 2:00pm (apt is 60min).

## 2017-09-12 NOTE — Telephone Encounter (Signed)
Noted  

## 2017-09-13 ENCOUNTER — Ambulatory Visit: Payer: Commercial Managed Care - PPO | Admitting: Family Medicine

## 2017-09-13 ENCOUNTER — Encounter: Payer: Self-pay | Admitting: Family Medicine

## 2017-09-13 VITALS — BP 108/79 | HR 100 | Temp 98.4°F | Resp 20 | Ht 62.0 in | Wt 266.0 lb

## 2017-09-13 DIAGNOSIS — M79662 Pain in left lower leg: Secondary | ICD-10-CM | POA: Diagnosis not present

## 2017-09-13 DIAGNOSIS — M5441 Lumbago with sciatica, right side: Secondary | ICD-10-CM

## 2017-09-13 DIAGNOSIS — G8929 Other chronic pain: Secondary | ICD-10-CM | POA: Diagnosis not present

## 2017-09-13 DIAGNOSIS — M5442 Lumbago with sciatica, left side: Secondary | ICD-10-CM

## 2017-09-13 DIAGNOSIS — Z720 Tobacco use: Secondary | ICD-10-CM

## 2017-09-13 DIAGNOSIS — E662 Morbid (severe) obesity with alveolar hypoventilation: Secondary | ICD-10-CM

## 2017-09-13 DIAGNOSIS — M79604 Pain in right leg: Secondary | ICD-10-CM

## 2017-09-13 DIAGNOSIS — M79661 Pain in right lower leg: Secondary | ICD-10-CM

## 2017-09-13 DIAGNOSIS — J449 Chronic obstructive pulmonary disease, unspecified: Secondary | ICD-10-CM | POA: Diagnosis not present

## 2017-09-13 DIAGNOSIS — M79605 Pain in left leg: Secondary | ICD-10-CM

## 2017-09-13 DIAGNOSIS — M7989 Other specified soft tissue disorders: Secondary | ICD-10-CM

## 2017-09-13 DIAGNOSIS — N281 Cyst of kidney, acquired: Secondary | ICD-10-CM | POA: Diagnosis not present

## 2017-09-13 NOTE — Progress Notes (Signed)
OFFICE VISIT  09/13/2017   CC:  Chief Complaint  Patient presents with  . Follow-up    RCI and MRI     HPI:    Patient is a 52 y.o. Caucasian female who presents for 2 week f/u obesity hypoventilation syndrom, COPD with ongoing tobacco abuse, chronic LBP, chronic bilat Leg pains and swelling. Recent lumbar MRI showed no significant problem that would lead to legs pain or weakness or paresthesias. There has been no change in her sx's.  Has NCS/EMG's scheduled for 10/03/17 with her neurologist.  She is going to do the overnight oximetry testing on RA early next week. She'll also be starting symbicort soon thanks to a discount card.  We reviewed her recent MRI L spine results, including the incidental finding of the renal cyst that has enlarged since 2016.  Need for renal u/s to further assess was discussed with pt today.  She continues to smoke. She feels like the lyrica is to blame for most of her excessive wt.  She states that recently she has started to notice intermittent cold and tingly sensation in both hands, hands turn pale/almost bluish hue, lasts minutes and resolves spontaneously.  Past Medical History:  Diagnosis Date  . Anxiety   . Arthritis   . Bilateral leg pain 2018/19   ? venous reflux.  . Bipolar 1 disorder ([place])   . Bronchitis, mucopurulent recurrent ([place])    PFTs 04/2016 showed no sign of copd or asthma, plus she actually had worse FEV1 after albuterol.  . Chronic pain syndrome    chronic low back pain (Pain mgmt= Dr. [name]).  MRI showed no signif disc dz, only showed some facet arthropathy at L4-5, with joint diastasis at L4-5--plan as of 11/07/16 neurosurg eval is bilat facet injections.  . Colitis due to Clostridium difficile 2001  . Cyst of left kidney 2016   Initially noted at 3 cm size, simple appearing, on CT abd/pelv in 2016.  On MRI L spine w and w/out contrast it was larger, lobulated--->ultrasound was recommended.  Marland Kitchen GERD (gastroesophageal  reflux disease)   . Hepatic hemangioma   . Hepatic steatosis 06/2016   Noted incidentally on CT chest  . History of Salmonella gastroenteritis   . Hyperlipidemia    a. Noted 02/2014.  Marland Kitchen Hypertension    HCTZ started 07/2015  . IBS (irritable bowel syndrome)   . Iron deficiency anemia 05/2014   per pt it is not due to vaginal blood loss; hemoccults sent to pt in mail 413/16.  . Lumbar spondylosis    mild, mainly focused at L4-5 and L5-S1.  MRI L spine 09/09/17-->mild degenerative disc disease and facet arthrosis without any nerve root encroachment, no spinal stenosis.  . Microscopic hematuria    Intermittent (no w/u done yet, as of 03/2014)  . Migraine headache   . Morbid obesity ([place])   . Multiple lipomas 09/2016   left upper arm--very small ones.  . Peripheral edema Fall 2018   R>L, non-pitting for the most part--venous doppler neg for DVT 01/2017.  Saw Dr. Bridgett Larsson (Vasc) 04/12/17 and u/s showed some venous reflux dz but he felt her sx's in legs were NOT due to her venous insufficiency OR PAD.  Thigh high comp stockings rx'd.  . Polycystic ovarian syndrome   . Pruritic dermatitis 2015/2016   +Scabies prep at [facility] 07/2014. (Primarily pruritic skin, but eventually a subtle rash as well)  . Rectal cancer ([place])    a. Followed by Dr. Gala Romney,  dx 2000-2001. Tumor removed from rectal.  . Recurrent sinusitis    +allergic rhinitis  . Right shoulder pain 2017   Murphy/Wainer: RC bursitis + RC tear (MRI)--injection trial is plan per pt report 05/04/16.  . Tobacco dependence   . Vitamin D deficiency     Past Surgical History:  Procedure Laterality Date  . CARDIOVASCULAR STRESS TEST  03/24/14   Lexscan MIBI: mild anterior ischemia?  Cardiac CT angiogram recommended/done.  . CHOLECYSTECTOMY    . COLONOSCOPY  05/05/2007   adenoma  . coronary CT angio  03/29/14   Two vessel dz/moderate stenosis of mid LAD and proximal RCA; cath recommended.  Marland Kitchen DILATION AND CURETTAGE OF UTERUS     for  vaginal bleeding  . JOINT REPLACEMENT    . LEFT HEART CATHETERIZATION WITH CORONARY ANGIOGRAM N/A 03/31/2014   Normal coronaries, EF 31-49%, diastolic dysfunction.  Procedure: LEFT HEART CATHETERIZATION WITH CORONARY ANGIOGRAM;  Surgeon: Sinclair Grooms, MD;  Location: [facility] CATH LAB;  Service: Cardiovascular;  Laterality: N/A;  . LOWER EXT VENOUS DOPPLERS Bilateral 02/08/2017   NEG for DVT (bilat)  . PFTs  04/2016   No sign of COPD or asthma; FEV 1 worse after albut.  . Situ removed  age 28   High-grade rectal adenoma removed from rectum   . TRANSTHORACIC ECHOCARDIOGRAM  03/23/14   Normal    Outpatient Medications Prior to Visit  Medication Sig Dispense Refill  . albuterol (PROVENTIL HFA) 108 (90 Base) MCG/ACT inhaler Inhale 1-2 puffs into the lungs every 4 (four) hours as needed. 1 Inhaler 3  . albuterol (PROVENTIL) (2.5 MG/3ML) 0.083% nebulizer solution Take 3 mLs (2.5 mg total) by nebulization every 6 (six) hours as needed for wheezing or shortness of breath. 75 mL 6  . aspirin 81 MG chewable tablet Chew 1 tablet (81 mg total) by mouth daily. 14 tablet 0  . azelastine (ASTELIN) 0.1 % nasal spray Place 2 sprays into both nostrils 2 (two) times daily. 30 mL 6  . busPIRone (BUSPAR) 15 MG tablet 1 in the morning and 2 at bedtime    . cetirizine (ZYRTEC) 10 MG tablet TAKE ONE TABLET BY MOUTH ONCE DAILY 30 tablet 11  . clonazePAM (KLONOPIN) 0.5 MG tablet TAKE 1 TABLET BY MOUTH ONCE DAILY DURING THE  DAY AS NEEDED, THEN TAKE 2 TABLETS AT BEDTIME. 90 tablet 5  . cyclobenzaprine (FLEXERIL) 10 MG tablet TAKE ONE TABLET BY MOUTH EVERY 6 HOURS AS NEEDED (SPASM RELATED TO HEADACHES) (Patient taking differently: Take 10 mg by mouth 3 (three) times daily. TAKE ONE TABLET BY MOUTH EVERY 6 HOURS AS NEEDED (SPASM RELATED TO HEADACHES)) 42 tablet 5  . diphenhydrAMINE (BENADRYL) 25 MG tablet Take 1 tablet (25 mg total) by mouth every 6 (six) hours. (Patient taking differently: Take 25-50 mg by mouth every 6  (six) hours as needed for itching or allergies. ) 20 tablet 0  . fluticasone (FLONASE) 50 MCG/ACT nasal spray Place 2 sprays into both nostrils daily. 16 g 1  . hydrOXYzine (ATARAX/VISTARIL) 25 MG tablet Take 25 mg by mouth 3 (three) times daily.     Marland Kitchen IRON-FOLIC ACID PO Take 65 mg by mouth daily.     Marland Kitchen LATUDA 20 MG TABS tablet Take 1 tablet by mouth daily.  0  . losartan (COZAAR) 50 MG tablet Take 1 tablet (50 mg total) by mouth daily. 30 tablet 3  . LYRICA 150 MG capsule Take 1 tablet by mouth 3 (three) times daily.  2  .  Magnesium-Potassium 40-40 MG CAPS Take 1 capsule by mouth daily.    . metoprolol tartrate (LOPRESSOR) 50 MG tablet TAKE 1 TABLET BY MOUTH TWICE DAILY 60 tablet 1  . Naldemedine Tosylate (SYMPROIC) 0.2 MG TABS Take 1 tablet by mouth daily.    . nitroGLYCERIN (NITROSTAT) 0.4 MG SL tablet Place 1 tablet (0.4 mg total) under the tongue every 5 (five) minutes as needed for chest pain. 20 tablet 0  . Oxcarbazepine (TRILEPTAL) 300 MG tablet Take 150 mg by mouth daily.    Marland Kitchen oxyCODONE-acetaminophen (PERCOCET) 10-325 MG tablet Take 1 tablet by mouth 3 (three) times daily.     . pantoprazole (PROTONIX) 40 MG tablet TAKE 1 TABLET BY MOUTH ONCE DAILY 90 tablet 1  . promethazine (PHENERGAN) 25 MG tablet TAKE 1 TABLET BY MOUTH EVERY 6 HOURS AS NEEDED FOR NAUSEA AND VOMITING 30 tablet 3  . ranitidine (ZANTAC) 150 MG tablet Take 150 mg by mouth 2 (two) times daily.    Marland Kitchen rOPINIRole (REQUIP) 0.25 MG tablet TAKE 3 TABLETS BY MOUTH ONCE DAILY AT BEDTIME 90 tablet 6  . SYMBICORT 160-4.5 MCG/ACT inhaler Inhale 2 puffs into the lungs 2 (two) times daily.  6  . budesonide (PULMICORT) 1 MG/2ML nebulizer solution Take 2 mLs (1 mg total) by nebulization daily. (Patient not taking: Reported on 09/13/2017) 60 mL 12  . fluconazole (DIFLUCAN) 150 MG tablet 1 tab po qd x 2 days (Patient not taking: Reported on 08/30/2017) 2 tablet 2  . nystatin (MYCOSTATIN) 100000 UNIT/ML suspension 1 tsp po qid swish, gargle,  and spit x 14d (Patient not taking: Reported on 09/13/2017) 280 mL 0   No facility-administered medications prior to visit.     Allergies  Allergen Reactions  . Contrast Media [Iodinated Diagnostic Agents] Anaphylaxis    Needs 13-hour prep  . Shellfish Allergy Anaphylaxis  . Codeine Nausea And Vomiting and Other (See Comments)    Hallucinations, Also sees people that are not there   . Furosemide Rash    Rash, soB.  . Lamictal [Lamotrigine] Rash  . Ivermectin Nausea And Vomiting    N/V and rash  . Betadine [Povidone Iodine] Rash  . Latex Hives  . Lipitor [Atorvastatin] Itching  . Permethrin Itching  . Tape Dermatitis    Paper tape and clear    ROS As per HPI  PE: Blood pressure 108/79, pulse 100, temperature 98.4 F (36.9 C), temperature source Oral, resp. rate 20, height 5\' 2"  (1.575 m), weight 266 lb (120.7 kg), last menstrual period 08/15/2017, SpO2 95 %. Gen: Alert, well appearing.  Patient is oriented to person, place, time, and situation. AFFECT: pleasant, lucid thought and speech. LEGS: no pitting edema.  No rash.  She has mild/mod diffuse nonpitting edema of both LL's and tops of feet.  No skin breakdown or maceration. Cannot palpate DP or PT pulses on either side, possibly due to swelling.  Feet/toes pink and warm. Hands are warm, pink, normal nails, normal wrists/fingers ROM.  Radial pulses 2+ bilat.  LABS:    Chemistry      Component Value Date/Time   NA 136 05/29/2017 1638   K 4.2 05/29/2017 1638   CL 101 05/29/2017 1638   CO2 22 05/29/2017 1638   BUN 11 05/29/2017 1638   CREATININE 0.71 05/29/2017 1638      Component Value Date/Time   CALCIUM 9.6 05/29/2017 1638   ALKPHOS 71 05/29/2017 1638   AST 21 05/29/2017 1638   ALT 17 05/29/2017 1638   BILITOT  0.9 05/29/2017 1638     Lab Results  Component Value Date   WBC 9.4 05/29/2017   HGB 16.6 (H) 05/29/2017   HCT 49.8 (H) 05/29/2017   MCV 89.4 05/29/2017   PLT 298 05/29/2017   Lab Results   Component Value Date   TSH 1.12 10/08/2016     IMPRESSION AND PLAN:  1) Obesity hypoventilation syndrome + COPD: no change/stable. She is set up for overnight oximetry monitoring next week (room air). She refuses to do a sleep study. She is not motivated to do the exercise or dieting necessary to lose wt. Encouraged total smoking cessation but she is not contemplating quitting. Will check echo to see if evidence of RV impairment or pulm HTN.  2) Leg pains and swelling: pain is not due to spinal nerve impingement or spinal stenosis --recent MRI did not support anything like this occurring. Neuro to w/u pain further with NCS's. I'll check ABIs/LE art dopplers. If neg, then refer to vein clinic on [address] for second opinion regarding her venous reflux dz. Echo for signs of pulm HTN/RV failure as stated in #1 above.  3) Left kidney cyst: enlargement noted on recent MR spine imaging compared to imaging 2016. Will eval further with renal ultrasound as per radiologist's recommendations.  4) Raynaud's phenomenon: no necrosis.  Monitor sx's/watchful waiting approach at this time.  An After Visit Summary was printed and given to the patient.  FOLLOW UP: Return in about 1 month (around 10/11/2017) for 30 min f/u OHS/COPD/LEG swelling/Leg pains.  Signed:  Crissie Sickles, MD           09/13/2017

## 2017-09-17 ENCOUNTER — Telehealth: Payer: Self-pay | Admitting: Family Medicine

## 2017-09-17 NOTE — Telephone Encounter (Signed)
Copied from [place] [id number]. Topic: General - Other >> Sep 17, 2017  3:47 PM Cecelia Byars, NT wrote: Reason for CRM: Corene Cornea from [facility] has not been able to get in to touch with the patient please call him with any questions  [phone] if the patient call she needs to ask for ext [phone]

## 2017-09-18 DIAGNOSIS — R0902 Hypoxemia: Secondary | ICD-10-CM | POA: Diagnosis not present

## 2017-09-18 DIAGNOSIS — J449 Chronic obstructive pulmonary disease, unspecified: Secondary | ICD-10-CM | POA: Diagnosis not present

## 2017-09-18 DIAGNOSIS — G4734 Idiopathic sleep related nonobstructive alveolar hypoventilation: Secondary | ICD-10-CM

## 2017-09-18 HISTORY — DX: Idiopathic sleep related nonobstructive alveolar hypoventilation: G47.34

## 2017-09-19 NOTE — Telephone Encounter (Signed)
FYI

## 2017-09-19 NOTE — Telephone Encounter (Signed)
Noted  

## 2017-09-19 NOTE — Telephone Encounter (Signed)
Pls call Jason at [facility] and follow up: is this regarding her overnight oximetry testing? -thx

## 2017-09-19 NOTE — Telephone Encounter (Signed)
[facility] [place], he stated that they were able to contact pt and pt will be set up today for sleep study.

## 2017-09-20 ENCOUNTER — Other Ambulatory Visit: Payer: Self-pay | Admitting: Family Medicine

## 2017-09-20 NOTE — Telephone Encounter (Signed)
RF request for requip LOV: 09/13/17 Next ov: 10/16/17 Last written: 02/21/17 #90 w/ 6RF  Please advise. Thanks.

## 2017-09-23 ENCOUNTER — Ambulatory Visit: Payer: Self-pay | Admitting: Family Medicine

## 2017-09-23 HISTORY — PX: OTHER SURGICAL HISTORY: SHX169

## 2017-09-23 NOTE — Telephone Encounter (Signed)
Noted  

## 2017-09-23 NOTE — Telephone Encounter (Signed)
I returned her call.  She is c/o having blisters on the fingers of both of her hands since Friday.  She also said she has a rash on her shoulders.   "I keep and get rashes all the time".    "I've always got rashes".    "I'm mostly concerned with the blisters on my fingers".    She mentioned she thinks she might have scabies but when asked if she has been exposed to them she says no.   She had them 2 years ago and now she just wants to make sure she doesn't have them now.   I let her know a provider would need to see her before a medication could be prescribed.    She wanted Dr. Anitra Lauth to just call something in.  See triage notes.   Dr. Anitra Lauth is out the rest of the week.   I scheduled her with Raiford Noble, PA who is filling in this week at this practice.   The appt is 09/24/17 at 4:15 at her request.   Reason for Disposition . Mild widespread rash  Answer Assessment - Initial Assessment Questions 1. APPEARANCE of RASH: "Describe the rash." (e.g., spots, blisters, raised areas, skin peeling, scaly)     Splotchy looking.   I have bumps on my shoulders.   I have blisters on my fingers.  The blisters are on several fingers on both hands.   I have many allergies.    2. SIZE: "How big are the spots?" (e.g., tip of pen, eraser, coin; inches, centimeters)     The blisters are tiny.    The blisters started Friday night.    "I get rashes all the time".    "That's normal for me". 3. LOCATION: "Where is the rash located?"     Several fingers on both hands. 4. COLOR: "What color is the rash?" (Note: It is difficult to assess rash color in people with darker-colored skin. When this situation occurs, simply ask the caller to describe what they see.)     Clear fluid in the blisters. 5. ONSET: "When did the rash begin?"     Friday evening.    They are not itching but I feel them.    6. FEVER: "Do you have a fever?" If so, ask: "What is your temperature, how was it measured, and when did it start?"     No 7.  ITCHING: "Does the rash itch?" If so, ask: "How bad is the itch?" (Scale 1-10; or mild, moderate, severe)     No.   After I pop the blister they will itch. 8. CAUSE: "What do you think is causing the rash?"     I've had scabies before and I'm scarred it could be that again.   I've not been exposed.    I'm allergic to scabies and the medication cream used to treat it.    9. MEDICATION FACTORS: "Have you started any new medications within the last 2 weeks?" (e.g., antibiotics)      No 10. OTHER SYMPTOMS: "Do you have any other symptoms?" (e.g., dizziness, headache, sore throat, joint pain)       No 11. PREGNANCY: "Is there any chance you are pregnant?" "When was your last menstrual period?"       No  Protocols used: RASH OR REDNESS - [facility]

## 2017-09-24 ENCOUNTER — Ambulatory Visit (INDEPENDENT_AMBULATORY_CARE_PROVIDER_SITE_OTHER): Payer: Commercial Managed Care - PPO | Admitting: Physician Assistant

## 2017-09-24 ENCOUNTER — Encounter: Payer: Self-pay | Admitting: Physician Assistant

## 2017-09-24 ENCOUNTER — Other Ambulatory Visit: Payer: Self-pay

## 2017-09-24 VITALS — BP 124/86 | HR 98 | Temp 98.5°F | Resp 16 | Ht 62.0 in | Wt 263.0 lb

## 2017-09-24 DIAGNOSIS — B354 Tinea corporis: Secondary | ICD-10-CM

## 2017-09-24 DIAGNOSIS — L301 Dyshidrosis [pompholyx]: Secondary | ICD-10-CM | POA: Diagnosis not present

## 2017-09-24 DIAGNOSIS — L853 Xerosis cutis: Secondary | ICD-10-CM

## 2017-09-24 MED ORDER — TRIAMCINOLONE ACETONIDE 0.5 % EX OINT: 1.0000 "application " | TOPICAL_OINTMENT | Freq: Two times a day (BID) | CUTANEOUS | 0 refills | Status: DC

## 2017-09-24 MED ORDER — TERBINAFINE HCL 1 % EX CREA: 1.0000 "application " | TOPICAL_CREAM | Freq: Two times a day (BID) | CUTANEOUS | 0 refills | Status: DC

## 2017-09-24 NOTE — Progress Notes (Signed)
Patient presents to clinic today c/o various skin lesions that she would like assessed today.   Patient notes a mild itchy, scaly annular rash of her upper left thigh present x 1-2 weeks. Notes a similar lesion of her R-lower back. Denies any pain or drainage. Has tried to avoid scratching the area.   Patent also noted bubbling/blistering lesions of her hands that occur intermittently and take a while to resolve. States she usually pops them to help them resolve quicker. Does note dry skin. Notes this lesions are always on the palms of her hands. Denies lesions elsewhere.   Past Medical History:  Diagnosis Date  . Anxiety   . Arthritis   . Bilateral leg pain 2018/19   ? venous reflux.  . Bipolar 1 disorder ([place])   . Bronchitis, mucopurulent recurrent ([place])    PFTs 04/2016 showed no sign of copd or asthma, plus she actually had worse FEV1 after albuterol.  . Chronic pain syndrome    chronic low back pain (Pain mgmt= Dr. [name]).  MRI showed no signif disc dz, only showed some facet arthropathy at L4-5, with joint diastasis at L4-5--plan as of 11/07/16 neurosurg eval is bilat facet injections.  . Colitis due to Clostridium difficile 2001  . Cyst of left kidney 2016   Initially noted at 3 cm size, simple appearing, on CT abd/pelv in 2016.  On MRI L spine w and w/out contrast it was larger, lobulated--->ultrasound was recommended.  Marland Kitchen GERD (gastroesophageal reflux disease)   . Hepatic hemangioma   . Hepatic steatosis 06/2016   Noted incidentally on CT chest  . History of Salmonella gastroenteritis   . Hyperlipidemia    a. Noted 02/2014.  Marland Kitchen Hypertension    HCTZ started 07/2015  . IBS (irritable bowel syndrome)   . Iron deficiency anemia 05/2014   per pt it is not due to vaginal blood loss; hemoccults sent to pt in mail 413/16.  . Lumbar spondylosis    mild, mainly focused at L4-5 and L5-S1.  MRI L spine 09/09/17-->mild degenerative disc disease and facet arthrosis without any nerve root  encroachment, no spinal stenosis.  . Microscopic hematuria    Intermittent (no w/u done yet, as of 03/2014)  . Migraine headache   . Morbid obesity ([place])   . Multiple lipomas 09/2016   left upper arm--very small ones.  . Obesity hypoventilation syndrome ([place])   . Peripheral edema Fall 2018   R>L, non-pitting for the most part--venous doppler neg for DVT 01/2017.  Saw Dr. Bridgett Larsson (Vasc) 04/12/17 and u/s showed some venous reflux dz but he felt her sx's in legs were NOT due to her venous insufficiency OR PAD.  Thigh high comp stockings rx'd.  . Polycystic ovarian syndrome   . Pruritic dermatitis 2015/2016   +Scabies prep at [facility] 07/2014. (Primarily pruritic skin, but eventually a subtle rash as well)  . Raynaud's phenomenon 2019  . Rectal cancer ([place])    a. Followed by Dr. Gala Romney, dx 2000-2001. Tumor removed from rectal.  . Recurrent sinusitis    +allergic rhinitis  . Right shoulder pain 2017   Murphy/Wainer: RC bursitis + RC tear (MRI)--injection trial is plan per pt report 05/04/16.  . Tobacco dependence   . Vitamin D deficiency     Current Outpatient Medications on File Prior to Visit  Medication Sig Dispense Refill  . albuterol (PROVENTIL HFA) 108 (90 Base) MCG/ACT inhaler Inhale 1-2 puffs into the lungs every 4 (four) hours as needed. 1  Inhaler 3  . albuterol (PROVENTIL) (2.5 MG/3ML) 0.083% nebulizer solution Take 3 mLs (2.5 mg total) by nebulization every 6 (six) hours as needed for wheezing or shortness of breath. 75 mL 6  . aspirin 81 MG chewable tablet Chew 1 tablet (81 mg total) by mouth daily. 14 tablet 0  . azelastine (ASTELIN) 0.1 % nasal spray Place 2 sprays into both nostrils 2 (two) times daily. 30 mL 6  . busPIRone (BUSPAR) 15 MG tablet 1 in the morning and 2 at bedtime    . cetirizine (ZYRTEC) 10 MG tablet TAKE ONE TABLET BY MOUTH ONCE DAILY 30 tablet 11  . clonazePAM (KLONOPIN) 0.5 MG tablet TAKE 1 TABLET BY MOUTH ONCE DAILY DURING THE  DAY AS NEEDED, THEN TAKE  2 TABLETS AT BEDTIME. 90 tablet 5  . cyclobenzaprine (FLEXERIL) 10 MG tablet TAKE ONE TABLET BY MOUTH EVERY 6 HOURS AS NEEDED (SPASM RELATED TO HEADACHES) (Patient taking differently: Take 10 mg by mouth 3 (three) times daily. TAKE ONE TABLET BY MOUTH EVERY 6 HOURS AS NEEDED (SPASM RELATED TO HEADACHES)) 42 tablet 5  . diphenhydrAMINE (BENADRYL) 25 MG tablet Take 1 tablet (25 mg total) by mouth every 6 (six) hours. (Patient taking differently: Take 25-50 mg by mouth every 6 (six) hours as needed for itching or allergies. ) 20 tablet 0  . fluticasone (FLONASE) 50 MCG/ACT nasal spray Place 2 sprays into both nostrils daily. 16 g 1  . hydrOXYzine (ATARAX/VISTARIL) 25 MG tablet Take 25 mg by mouth 3 (three) times daily.     Marland Kitchen IRON-FOLIC ACID PO Take 65 mg by mouth daily.     Marland Kitchen LATUDA 20 MG TABS tablet Take 1 tablet by mouth daily.  0  . losartan (COZAAR) 50 MG tablet Take 1 tablet (50 mg total) by mouth daily. 30 tablet 3  . LYRICA 150 MG capsule Take 1 tablet by mouth 3 (three) times daily.  2  . Magnesium-Potassium 40-40 MG CAPS Take 1 capsule by mouth daily.    . metoprolol tartrate (LOPRESSOR) 50 MG tablet TAKE 1 TABLET BY MOUTH TWICE DAILY 60 tablet 1  . Naldemedine Tosylate (SYMPROIC) 0.2 MG TABS Take 1 tablet by mouth daily.    . nitroGLYCERIN (NITROSTAT) 0.4 MG SL tablet Place 1 tablet (0.4 mg total) under the tongue every 5 (five) minutes as needed for chest pain. 20 tablet 0  . Oxcarbazepine (TRILEPTAL) 300 MG tablet Take 150 mg by mouth daily.    Marland Kitchen oxyCODONE-acetaminophen (PERCOCET) 10-325 MG tablet Take 1 tablet by mouth 3 (three) times daily.     . pantoprazole (PROTONIX) 40 MG tablet TAKE 1 TABLET BY MOUTH ONCE DAILY 90 tablet 1  . promethazine (PHENERGAN) 25 MG tablet TAKE 1 TABLET BY MOUTH EVERY 6 HOURS AS NEEDED FOR NAUSEA AND VOMITING 30 tablet 3  . ranitidine (ZANTAC) 150 MG tablet Take 150 mg by mouth 2 (two) times daily.    Marland Kitchen rOPINIRole (REQUIP) 0.25 MG tablet TAKE 3 TABLETS BY  MOUTH ONCE DAILY AT BEDTIME 90 tablet 6  . SYMBICORT 160-4.5 MCG/ACT inhaler Inhale 2 puffs into the lungs 2 (two) times daily.  6  . [DISCONTINUED] citalopram (CELEXA) 40 MG tablet Take 20 mg by mouth at bedtime.     . [DISCONTINUED] omeprazole (PRILOSEC) 20 MG capsule Take 40 mg by mouth daily.     No current facility-administered medications on file prior to visit.     Allergies  Allergen Reactions  . Contrast Media [Iodinated Diagnostic Agents] Anaphylaxis  Needs 13-hour prep  . Shellfish Allergy Anaphylaxis  . Codeine Nausea And Vomiting and Other (See Comments)    Hallucinations, Also sees people that are not there   . Furosemide Rash    Rash, soB.  . Lamictal [Lamotrigine] Rash  . Ivermectin Nausea And Vomiting    N/V and rash  . Betadine [Povidone Iodine] Rash  . Latex Hives  . Lipitor [Atorvastatin] Itching  . Permethrin Itching  . Tape Dermatitis    Paper tape and clear    Family History  Problem Relation Age of Onset  . Colon cancer Father 55  . Cancer Father   . Diverticulitis Mother   . Heart disease Mother   . Hypertension Mother   . Hyperlipidemia Mother   . Mental illness Mother   . Diabetes Mother   . Heart attack Mother   . Stroke Neg Hx     Social History   Socioeconomic History  . Marital status: Married    Spouse name: Not on file  . Number of children: 1  . Years of education: Not on file  . Highest education level: Not on file  Occupational History  . Occupation: own Games developer: [employer]  . Financial resource strain: Not on file  . Food insecurity:    Worry: Not on file    Inability: Not on file  . Transportation needs:    Medical: Not on file    Non-medical: Not on file  Tobacco Use  . Smoking status: Current Every Day Smoker    Packs/day: 0.50    Years: 18.00    Pack years: 9.00    Types: Cigarettes  . Smokeless tobacco: Never Used  . Tobacco comment: 25 years  Substance  and Sexual Activity  . Alcohol use: No    Alcohol/week: 0.0 oz  . Drug use: No  . Sexual activity: Never    Birth control/protection: None, Surgical  Lifestyle  . Physical activity:    Days per week: Not on file    Minutes per session: Not on file  . Stress: Not on file  Relationships  . Social connections:    Talks on phone: Not on file    Gets together: Not on file    Attends religious service: Not on file    Active member of club or organization: Not on file    Attends meetings of clubs or organizations: Not on file    Relationship status: Not on file  Other Topics Concern  . Not on file  Social History Narrative  . Not on file   Review of Systems - See HPI.  All other ROS are negative.  BP 124/86   Pulse 98   Temp 98.5 F (36.9 C) (Oral)   Resp 16   Ht 5\' 2"  (1.575 m)   Wt 263 lb (119.3 kg)   SpO2 97%   BMI 48.10 kg/m   Physical Exam  Constitutional: She appears well-developed and well-nourished.  HENT:  Head: Normocephalic and atraumatic.  Cardiovascular: Normal rate, regular rhythm and normal heart sounds.  Pulmonary/Chest: Effort normal.  Skin:     Psychiatric: She has a normal mood and affect.  Vitals reviewed.   Assessment/Plan: 1. Dyshidrotic eczema Supportive measures reviewed. Start trial of Kenalog with occlusive dressing at night to see if this helps. Dermatology assessment if not improving. - triamcinolone ointment (KENALOG) 0.5 %; Apply 1 application topically 2 (two) times daily.  Dispense: 30 g;  Refill: 0  2. Tinea corporis Start Lamisil BID for up to 2 weeks to help with resolution. Follow-up if not resolving.  - terbinafine (LAMISIL) 1 % cream; Apply 1 application topically 2 (two) times daily.  Dispense: 30 g; Refill: 0  3. Xerosis of skin Home moisturizers and emollients reviewed.    Leeanne Rio, PA-C

## 2017-09-24 NOTE — Patient Instructions (Signed)
Please keep well-hydrated and get plenty of rest. Limit use of soaps that are scented and dyed (colored) as this is rough on your skin and can cause reactions and drying of the skin.  Try soaps like Cetaphil or Cerave that are for sensitive skin. Moisturize the skin daily after shower with Eucerin, Vaseline lotion, etc to help prevent drying of skin.  For the yeast rash --  please apply the Lamisil cream to the area twice daily for 10-14 days or until rash is gone. (This medicine is OTC but I wrote as a prescription to make cheaper).  For Dyshidrotic eczema -- Try to apply the Kenalog cream to the hands each evening and cover with a glove for 7-10 days. Let me know if symptoms do not improve or if you cannot tolerate the cream.

## 2017-09-30 ENCOUNTER — Encounter: Payer: Self-pay | Admitting: Family Medicine

## 2017-09-30 DIAGNOSIS — M79605 Pain in left leg: Secondary | ICD-10-CM | POA: Diagnosis not present

## 2017-09-30 DIAGNOSIS — N281 Cyst of kidney, acquired: Secondary | ICD-10-CM | POA: Diagnosis not present

## 2017-09-30 DIAGNOSIS — M79604 Pain in right leg: Secondary | ICD-10-CM | POA: Diagnosis not present

## 2017-09-30 HISTORY — PX: OTHER SURGICAL HISTORY: SHX169

## 2017-10-01 ENCOUNTER — Telehealth: Payer: Self-pay | Admitting: Family Medicine

## 2017-10-01 NOTE — Telephone Encounter (Signed)
Patient notified that PCP out of office until 10/03/17 and we would contact her as soon as possible.

## 2017-10-01 NOTE — Telephone Encounter (Signed)
Copied from [place] [id number]. Topic: Quick Communication - See Telephone Encounter >> Oct 01, 2017 10:52 AM Bea Graff, NT wrote: CRM for notification. See Telephone encounter for: 10/01/17. Patient calling and states [facility] came out and did an O2 stat check on her last week but she has not heard anything back and would like to see what the plan of care is. CB#: [phone]

## 2017-10-02 NOTE — Telephone Encounter (Signed)
I'll have to see if the report of this test is on my desk tomorrow, then we'll get back with her.-thx

## 2017-10-03 ENCOUNTER — Encounter: Payer: Self-pay | Admitting: Family Medicine

## 2017-10-03 ENCOUNTER — Telehealth: Payer: Self-pay | Admitting: Family Medicine

## 2017-10-03 DIAGNOSIS — M5416 Radiculopathy, lumbar region: Secondary | ICD-10-CM | POA: Diagnosis not present

## 2017-10-03 DIAGNOSIS — G603 Idiopathic progressive neuropathy: Secondary | ICD-10-CM | POA: Diagnosis not present

## 2017-10-03 HISTORY — PX: OTHER SURGICAL HISTORY: SHX169

## 2017-10-03 NOTE — Telephone Encounter (Signed)
Pt's overnight oxygen testing showed that she has mild drop in oxygen level for short periods of time. She does qualify for oxygen to wear during sleep. Pls work through resp therapy agency or [place] ([facility]?) to get her set up with home oxygen to use 2L via nasal cannulae during sleep.  Dx is nocturnal hypoxia.  -thx

## 2017-10-04 ENCOUNTER — Encounter: Payer: Self-pay | Admitting: *Deleted

## 2017-10-04 DIAGNOSIS — G4734 Idiopathic sleep related nonobstructive alveolar hypoventilation: Secondary | ICD-10-CM | POA: Insufficient documentation

## 2017-10-04 NOTE — Telephone Encounter (Signed)
Pt advised and voiced understanding.   

## 2017-10-04 NOTE — Telephone Encounter (Signed)
[facility] Oxygen Therapy form completed and faxed to [place] along w/ LOV notes, pts demo, copy of insurance cards.

## 2017-10-08 ENCOUNTER — Encounter: Payer: Self-pay | Admitting: Family Medicine

## 2017-10-08 ENCOUNTER — Telehealth: Payer: Self-pay | Admitting: Family Medicine

## 2017-10-08 DIAGNOSIS — R0609 Other forms of dyspnea: Principal | ICD-10-CM

## 2017-10-08 DIAGNOSIS — E662 Morbid (severe) obesity with alveolar hypoventilation: Secondary | ICD-10-CM

## 2017-10-08 DIAGNOSIS — R0600 Dyspnea, unspecified: Secondary | ICD-10-CM

## 2017-10-08 DIAGNOSIS — J449 Chronic obstructive pulmonary disease, unspecified: Secondary | ICD-10-CM

## 2017-10-08 NOTE — Telephone Encounter (Signed)
Also, kidney ultrasound to f/u kidney cyst showed that it was about 4 cm, which is minimally larger than 3 yrs ago, AND it has no worrisome features.  Reassure her that this is nothing to worry about.-thx

## 2017-10-08 NOTE — Telephone Encounter (Signed)
Recent arterial blood flow testing in legs came back normal. Will refer her to vein and vascular clinic in [facility] for 2nd opinion regarding her LE venous disease/swelling.  Also, I meant to order an echocardiogram after she was here last time---to check overall pump function of her heart, check heart valves, and see if lung pressures are elevated.  I'll order this for [facility].-thx

## 2017-10-09 ENCOUNTER — Other Ambulatory Visit: Payer: Self-pay | Admitting: Family Medicine

## 2017-10-09 DIAGNOSIS — E662 Morbid (severe) obesity with alveolar hypoventilation: Secondary | ICD-10-CM

## 2017-10-09 DIAGNOSIS — R0609 Other forms of dyspnea: Principal | ICD-10-CM

## 2017-10-09 DIAGNOSIS — J449 Chronic obstructive pulmonary disease, unspecified: Secondary | ICD-10-CM

## 2017-10-09 DIAGNOSIS — R0600 Dyspnea, unspecified: Secondary | ICD-10-CM

## 2017-10-09 DIAGNOSIS — M7989 Other specified soft tissue disorders: Secondary | ICD-10-CM

## 2017-10-09 DIAGNOSIS — I872 Venous insufficiency (chronic) (peripheral): Secondary | ICD-10-CM

## 2017-10-09 NOTE — Telephone Encounter (Signed)
Left message for pt to call back.   CRM: [id number]  Created By: Onalee Hua, CMA 10/09/2017 02:33 PM [] Print upon exit   From: Hyperspace Resolved By: Onalee Hua, [place] 10/09/2017 02:34 PM

## 2017-10-09 NOTE — Telephone Encounter (Signed)
I am not sending her to the [facility]. Vascular office.  I'll reorder her echo for [facility] radiology department.

## 2017-10-09 NOTE — Telephone Encounter (Signed)
Pt advised and voiced understanding.    She wanted to make sure that Dr. Anitra Lauth doesn't send her back to the Vein and Vascular doctor on [facility] in [place].   Also she stated that she can not go to [facility] because she owes them money.  Please advise. Thanks.

## 2017-10-09 NOTE — Telephone Encounter (Signed)
Pt advised and voiced understanding.   

## 2017-10-11 ENCOUNTER — Encounter: Payer: Self-pay | Admitting: Family Medicine

## 2017-10-14 ENCOUNTER — Encounter: Payer: Self-pay | Admitting: *Deleted

## 2017-10-14 ENCOUNTER — Other Ambulatory Visit: Payer: Self-pay | Admitting: *Deleted

## 2017-10-14 NOTE — Telephone Encounter (Signed)
Pt is calling to follow up. Please advise [phone]

## 2017-10-14 NOTE — Telephone Encounter (Signed)
Waiting on order form to come in from [facility]. [facility] unable to do order for oxygen since testing was done by [facility].  Pt advised and voiced understanding.

## 2017-10-16 ENCOUNTER — Encounter: Payer: Self-pay | Admitting: Family Medicine

## 2017-10-16 ENCOUNTER — Ambulatory Visit (INDEPENDENT_AMBULATORY_CARE_PROVIDER_SITE_OTHER): Payer: Commercial Managed Care - PPO | Admitting: Family Medicine

## 2017-10-16 VITALS — BP 110/74 | HR 95 | Temp 98.6°F | Resp 16 | Ht 62.0 in | Wt 262.5 lb

## 2017-10-16 DIAGNOSIS — R0609 Other forms of dyspnea: Secondary | ICD-10-CM | POA: Diagnosis not present

## 2017-10-16 DIAGNOSIS — J449 Chronic obstructive pulmonary disease, unspecified: Secondary | ICD-10-CM

## 2017-10-16 DIAGNOSIS — M7989 Other specified soft tissue disorders: Secondary | ICD-10-CM

## 2017-10-16 DIAGNOSIS — G4734 Idiopathic sleep related nonobstructive alveolar hypoventilation: Secondary | ICD-10-CM | POA: Diagnosis not present

## 2017-10-16 DIAGNOSIS — E662 Morbid (severe) obesity with alveolar hypoventilation: Secondary | ICD-10-CM

## 2017-10-16 DIAGNOSIS — M79604 Pain in right leg: Secondary | ICD-10-CM

## 2017-10-16 DIAGNOSIS — R0600 Dyspnea, unspecified: Secondary | ICD-10-CM

## 2017-10-16 DIAGNOSIS — M79605 Pain in left leg: Secondary | ICD-10-CM

## 2017-10-16 MED ORDER — VERAPAMIL HCL ER 120 MG PO TBCR: 120.0000 mg | EXTENDED_RELEASE_TABLET | Freq: Every day | ORAL | 1 refills | Status: DC

## 2017-10-16 NOTE — Progress Notes (Addendum)
OFFICE VISIT  10/16/2017   CC:  Chief Complaint  Patient presents with  . Follow-up    OHS, COPD, Leg swelling and leg pain   HPI:    Patient is a 52 y.o. Caucasian female who presents for 1 mo f/u chronic DOE with working dx of OHS and COPD.   Has chronic LL swelling--nonpitting for the most part.  Additional working diagnosis in this case is CHF since she does have DOE + lower extremity edema.  She had an overnight oximetry test done 09/18/17 which showed pulse ox that was 88% or less for 18 min 12 sec. She qualifies for oxygen during sleep and I've ordered this through [facility], 2 liters/min via nasal cannula--we are in the process of getting this set up for her.  Denies any change in DOE or recurrent coughing/wheezing.  Compliant with symbicort, taking albuterol prn (usually a couple times a day).  She continues to smoke and is not motivated to quit. Lower legs swelling and discomfort unchanged.  She recently got EMG/NCS at Dr. Shelby Dubin office, and she says he has requested that I send a report of her most recent L spine MRI. Pt has appt with vein/vascular clinic on [place] next month. She has an echocardiogram scheduled for 2d from now.  Today we also discussed the need to get her off metoprolol due to its potential to cause bronchospasm.  Recent blistery rash on hands resolved spontaneously--she never used the topical meds rx'd 09/24/17.   Past Medical History:  Diagnosis Date  . Anxiety   . Arthritis   . Bilateral leg pain 2018/19   ? venous reflux.  . Bipolar 1 disorder ([place])   . Bronchitis, mucopurulent recurrent ([place])    PFTs 04/2016 showed no sign of copd or asthma, plus she actually had worse FEV1 after albuterol.  . Chronic pain syndrome    chronic low back pain (Pain mgmt= Dr. [name]).  MRI showed no signif disc dz, only showed some facet arthropathy at L4-5, with joint diastasis at L4-5--plan as of 11/07/16 neurosurg eval is bilat facet injections.  . Colitis due  to Clostridium difficile 2001  . Cyst of left kidney 2016   Initially noted at 3 cm size, simple appearing, on CT abd/pelv in 2016.  On MRI L spine w and w/out contrast it was larger, lobulated--->ultrasound was done for f/u and it was 4.1 cm w/out worrisome features.  Marland Kitchen GERD (gastroesophageal reflux disease)   . Hepatic hemangioma   . Hepatic steatosis 06/2016   Noted incidentally on CT chest  . History of Salmonella gastroenteritis   . Hyperlipidemia    a. Noted 02/2014.  Marland Kitchen Hypertension    HCTZ started 07/2015  . IBS (irritable bowel syndrome)   . Iron deficiency anemia 05/2014   per pt it is not due to vaginal blood loss; hemoccults sent to pt in mail 413/16.  . Lumbar spondylosis    mild, mainly focused at L4-5 and L5-S1.  MRI L spine 09/09/17-->mild degenerative disc disease and facet arthrosis without any nerve root encroachment, no spinal stenosis.  . Microscopic hematuria    Intermittent (no w/u done yet, as of 03/2014)  . Migraine headache   . Morbid obesity ([place])   . Multiple lipomas 09/2016   left upper arm--very small ones.  . Nocturnal hypoxia 09/18/2017   Overnight oximetry -->qualified.  . Obesity hypoventilation syndrome ([place])   . Peripheral edema Fall 2018   R>L, non-pitting for the most part--venous doppler neg  for DVT 01/2017.  Saw Dr. Bridgett Larsson (Vasc) 04/12/17 and u/s showed some venous reflux dz but he felt her sx's in legs were NOT due to her venous insufficiency OR PAD.  Thigh high comp stockings rx'd.  . Polycystic ovarian syndrome   . Pruritic dermatitis 2015/2016   +Scabies prep at [facility] 07/2014. (Primarily pruritic skin, but eventually a subtle rash as well)  . Raynaud's phenomenon 2019  . Rectal cancer ([place])    a. Followed by Dr. Gala Romney, dx 2000-2001. Tumor removed from rectal.  . Recurrent sinusitis    +allergic rhinitis  . Right shoulder pain 2017   Murphy/Wainer: RC bursitis + RC tear (MRI)--injection trial is plan per pt report 05/04/16.  .  Tobacco dependence   . Vitamin D deficiency     Past Surgical History:  Procedure Laterality Date  . ABI's complete Bilateral 09/30/2017   Normal ABIs.  Great toe pressures adequate for wound healing.  Femoral artery waveforms triphasic (normal).  . CARDIOVASCULAR STRESS TEST  03/24/14   Lexscan MIBI: mild anterior ischemia?  Cardiac CT angiogram recommended/done.  . CHOLECYSTECTOMY    . COLONOSCOPY  05/05/2007   adenoma  . coronary CT angio  03/29/14   Two vessel dz/moderate stenosis of mid LAD and proximal RCA; cath recommended.  Marland Kitchen DILATION AND CURETTAGE OF UTERUS     for vaginal bleeding  . JOINT REPLACEMENT    . LEFT HEART CATHETERIZATION WITH CORONARY ANGIOGRAM N/A 03/31/2014   Normal coronaries, EF 82-50%, diastolic dysfunction.  Procedure: LEFT HEART CATHETERIZATION WITH CORONARY ANGIOGRAM;  Surgeon: Sinclair Grooms, MD;  Location: [facility] CATH LAB;  Service: Cardiovascular;  Laterality: N/A;  . LOWER EXT VENOUS DOPPLERS Bilateral 02/08/2017   NEG for DVT (bilat)  . Overnight oximetry testing  09/2017   qualified for oxygen supplementation during sleep  . PFTs  04/2016   No sign of COPD or asthma; FEV 1 worse after albut.  . Situ removed  age 52   High-grade rectal adenoma removed from rectum   . TRANSTHORACIC ECHOCARDIOGRAM  03/23/14   Normal    Outpatient Medications Prior to Visit  Medication Sig Dispense Refill  . albuterol (PROVENTIL HFA) 108 (90 Base) MCG/ACT inhaler Inhale 1-2 puffs into the lungs every 4 (four) hours as needed. 1 Inhaler 3  . albuterol (PROVENTIL) (2.5 MG/3ML) 0.083% nebulizer solution Take 3 mLs (2.5 mg total) by nebulization every 6 (six) hours as needed for wheezing or shortness of breath. 75 mL 6  . aspirin 81 MG chewable tablet Chew 1 tablet (81 mg total) by mouth daily. 14 tablet 0  . azelastine (ASTELIN) 0.1 % nasal spray Place 2 sprays into both nostrils 2 (two) times daily. 30 mL 6  . busPIRone (BUSPAR) 15 MG tablet 1 in the morning and 2 at  bedtime    . cetirizine (ZYRTEC) 10 MG tablet TAKE ONE TABLET BY MOUTH ONCE DAILY 30 tablet 11  . clonazePAM (KLONOPIN) 0.5 MG tablet TAKE 1 TABLET BY MOUTH ONCE DAILY DURING THE  DAY AS NEEDED, THEN TAKE 2 TABLETS AT BEDTIME. 90 tablet 5  . cyclobenzaprine (FLEXERIL) 10 MG tablet TAKE ONE TABLET BY MOUTH EVERY 6 HOURS AS NEEDED (SPASM RELATED TO HEADACHES) (Patient taking differently: Take 10 mg by mouth 3 (three) times daily. TAKE ONE TABLET BY MOUTH EVERY 6 HOURS AS NEEDED (SPASM RELATED TO HEADACHES)) 42 tablet 5  . diphenhydrAMINE (BENADRYL) 25 MG tablet Take 1 tablet (25 mg total) by mouth every 6 (six)  hours. (Patient taking differently: Take 25-50 mg by mouth every 6 (six) hours as needed for itching or allergies. ) 20 tablet 0  . fluticasone (FLONASE) 50 MCG/ACT nasal spray Place 2 sprays into both nostrils daily. 16 g 1  . hydrOXYzine (ATARAX/VISTARIL) 25 MG tablet Take 25 mg by mouth 3 (three) times daily.     Marland Kitchen IRON-FOLIC ACID PO Take 65 mg by mouth daily.     Marland Kitchen LATUDA 40 MG TABS tablet TAKE 1 TABLET BY MOUTH ONCE DAILY IN THE EVENING WITH FOOD (AT LEAST 350 CALORIES)  1  . losartan (COZAAR) 50 MG tablet Take 1 tablet (50 mg total) by mouth daily. 30 tablet 3  . LYRICA 150 MG capsule Take 1 tablet by mouth 3 (three) times daily.  2  . Magnesium-Potassium 40-40 MG CAPS Take 1 capsule by mouth daily.    Melynda Ripple Tosylate (SYMPROIC) 0.2 MG TABS Take 1 tablet by mouth daily.    . nitroGLYCERIN (NITROSTAT) 0.4 MG SL tablet Place 1 tablet (0.4 mg total) under the tongue every 5 (five) minutes as needed for chest pain. 20 tablet 0  . Oxcarbazepine (TRILEPTAL) 300 MG tablet Take 150 mg by mouth daily.    Marland Kitchen oxyCODONE-acetaminophen (PERCOCET) 10-325 MG tablet Take 1 tablet by mouth 3 (three) times daily.     . pantoprazole (PROTONIX) 40 MG tablet TAKE 1 TABLET BY MOUTH ONCE DAILY 90 tablet 1  . promethazine (PHENERGAN) 25 MG tablet TAKE 1 TABLET BY MOUTH EVERY 6 HOURS AS NEEDED FOR NAUSEA AND  VOMITING 30 tablet 3  . ranitidine (ZANTAC) 150 MG tablet Take 150 mg by mouth 2 (two) times daily.    Marland Kitchen rOPINIRole (REQUIP) 0.25 MG tablet TAKE 3 TABLETS BY MOUTH ONCE DAILY AT BEDTIME 90 tablet 6  . SYMBICORT 160-4.5 MCG/ACT inhaler Inhale 2 puffs into the lungs 2 (two) times daily.  6  . LATUDA 20 MG TABS tablet Take 1 tablet by mouth daily.  0  . metoprolol tartrate (LOPRESSOR) 50 MG tablet TAKE 1 TABLET BY MOUTH TWICE DAILY 60 tablet 1  . terbinafine (LAMISIL) 1 % cream Apply 1 application topically 2 (two) times daily. (Patient not taking: Reported on 10/16/2017) 30 g 0  . triamcinolone ointment (KENALOG) 0.5 % Apply 1 application topically 2 (two) times daily. (Patient not taking: Reported on 10/16/2017) 30 g 0   No facility-administered medications prior to visit.     Allergies  Allergen Reactions  . Contrast Media [Iodinated Diagnostic Agents] Anaphylaxis    Needs 13-hour prep  . Shellfish Allergy Anaphylaxis  . Codeine Nausea And Vomiting and Other (See Comments)    Hallucinations, Also sees people that are not there   . Furosemide Rash    Rash, soB.  . Lamictal [Lamotrigine] Rash  . Ivermectin Nausea And Vomiting    N/V and rash  . Betadine [Povidone Iodine] Rash  . Latex Hives  . Lipitor [Atorvastatin] Itching  . Permethrin Itching  . Tape Dermatitis    Paper tape and clear    ROS As per HPI  PE: Blood pressure 110/74, pulse 95, temperature 98.6 F (37 C), temperature source Oral, resp. rate 16, height 5\' 2"  (1.575 m), weight 262 lb 8 oz (119.1 kg), last menstrual period 09/16/2017, SpO2 94 %. Body mass index is 48.01 kg/m.  Gen: Alert, well appearing.  Patient is oriented to person, place, time, and situation. AFFECT: pleasant, lucid thought and speech. CV: RRR, no m/r/g.   LUNGS: CTA bilat, nonlabored  resps, good aeration in all lung fields. EXT: no pitting edema.  No clubbing or cyanosis.  DP and PT pulses palpable bilat.  LABS:    Chemistry       Component Value Date/Time   NA 136 05/29/2017 1638   K 4.2 05/29/2017 1638   CL 101 05/29/2017 1638   CO2 22 05/29/2017 1638   BUN 11 05/29/2017 1638   CREATININE 0.71 05/29/2017 1638      Component Value Date/Time   CALCIUM 9.6 05/29/2017 1638   ALKPHOS 71 05/29/2017 1638   AST 21 05/29/2017 1638   ALT 17 05/29/2017 1638   BILITOT 0.9 05/29/2017 1638     Lab Results  Component Value Date   WBC 9.4 05/29/2017   HGB 16.6 (H) 05/29/2017   HCT 49.8 (H) 05/29/2017   MCV 89.4 05/29/2017   PLT 298 05/29/2017   Lab Results  Component Value Date   TSH 1.12 10/08/2016   Lab Results  Component Value Date   CHOL 263 (H) 03/24/2014   HDL 47 03/24/2014   LDLCALC 169 (H) 03/24/2014   TRIG 237 (H) 03/24/2014   CHOLHDL 5.6 03/24/2014    IMPRESSION AND PLAN:  1) DOE: secondary to OHS and COPD.   Also, stop metoprolol--could worsen/cause bronchospasm.  Change to verapamil extended release 120 mg qd. Last echo 2015-normal.  Repeat echo has been ordered (10/09/17) to see if sign of pulm htn that would further support dx of OHS.  Also echo being done to see if any LV dysfunction and diagnosis of congestive heart failure (Pt has DOE + dependent edema). She had an overnight oximetry test done 09/18/17 which showed pulse ox that was 89% or less for 18 min 12 sec. She qualifies for oxygen during sleep and I've ordered this through [facility], 2 liters/min via nasal cannula--we are in the process of getting this set up for her.  2) LE swelling/pain: has vein and vascular clinic appt for next month to further eval her venous reflux/insufficiency (this is the office on [address], not the [facility] Vascular clinic). Will ask Dr. [name] for report of recent EMG/NCS. Will send him a copy of MRI L spine reading done recently.  3) Tobacco dependence: encouraged complete cessation but pt not contemplating quitting at this time.  An After Visit Summary was printed and given to the patient.  FOLLOW UP:  Return in about 1 month (around 11/13/2017) for routine chronic illness f/u (30 min).  Signed:  Crissie Sickles, MD           10/16/2017

## 2017-10-17 DIAGNOSIS — J449 Chronic obstructive pulmonary disease, unspecified: Secondary | ICD-10-CM | POA: Diagnosis not present

## 2017-10-17 DIAGNOSIS — J441 Chronic obstructive pulmonary disease with (acute) exacerbation: Secondary | ICD-10-CM | POA: Diagnosis not present

## 2017-10-17 DIAGNOSIS — R0902 Hypoxemia: Secondary | ICD-10-CM | POA: Diagnosis not present

## 2017-10-18 ENCOUNTER — Other Ambulatory Visit (HOSPITAL_COMMUNITY): Payer: Medicare Other

## 2017-10-21 ENCOUNTER — Ambulatory Visit (HOSPITAL_COMMUNITY)
Admission: RE | Admit: 2017-10-21 | Discharge: 2017-10-21 | Disposition: A | Discharge status: Home / Self Care | Payer: Commercial Managed Care - PPO | Source: Ambulatory Visit | Attending: Family Medicine | Admitting: Family Medicine

## 2017-10-21 DIAGNOSIS — R0609 Other forms of dyspnea: Secondary | ICD-10-CM | POA: Diagnosis not present

## 2017-10-21 DIAGNOSIS — Z6841 Body Mass Index (BMI) 40.0 and over, adult: Secondary | ICD-10-CM | POA: Diagnosis not present

## 2017-10-21 DIAGNOSIS — E662 Morbid (severe) obesity with alveolar hypoventilation: Secondary | ICD-10-CM | POA: Diagnosis not present

## 2017-10-21 DIAGNOSIS — F319 Bipolar disorder, unspecified: Secondary | ICD-10-CM | POA: Diagnosis not present

## 2017-10-21 DIAGNOSIS — R0600 Dyspnea, unspecified: Secondary | ICD-10-CM

## 2017-10-21 DIAGNOSIS — Z720 Tobacco use: Secondary | ICD-10-CM | POA: Insufficient documentation

## 2017-10-21 DIAGNOSIS — J449 Chronic obstructive pulmonary disease, unspecified: Secondary | ICD-10-CM

## 2017-10-21 DIAGNOSIS — E785 Hyperlipidemia, unspecified: Secondary | ICD-10-CM | POA: Diagnosis not present

## 2017-10-21 NOTE — Progress Notes (Signed)
*  PRELIMINARY RESULTS* Echocardiogram 2D Echocardiogram has been performed.  Martha Espinoza 10/21/2017, 2:01 PM

## 2017-10-30 ENCOUNTER — Encounter: Payer: Self-pay | Admitting: Family Medicine

## 2017-10-31 DIAGNOSIS — R252 Cramp and spasm: Secondary | ICD-10-CM | POA: Diagnosis not present

## 2017-10-31 DIAGNOSIS — M797 Fibromyalgia: Secondary | ICD-10-CM | POA: Diagnosis not present

## 2017-10-31 DIAGNOSIS — M79606 Pain in leg, unspecified: Secondary | ICD-10-CM | POA: Diagnosis not present

## 2017-11-05 ENCOUNTER — Ambulatory Visit: Payer: Self-pay | Admitting: *Deleted

## 2017-11-05 NOTE — Telephone Encounter (Signed)
[facility] 167[facility]117.  This am 159/95, checked "In a store."  No other readings available; pt is not at home. States BP running higher and HR "95-100 at times" since metoprolol DCed and verapamil started.  States increased fatigue, mild intermittent headaches, generalized weakness, onset   "A few days after starting verapamil." Appt made for Thursday with Dr. Anitra Lauth as pt wishes to see him only; no availability. Wednesday; (pt requests late appt.) Instructed to go to ED if severe headache, one sided weakness, dysphagia, CP, dizziness occur. Pt verbalizes understanding. Reason for Disposition . [1] Taking BP medications AND [2] feels is having side effects (e.g., impotence, cough, dizzy upon standing)  Answer Assessment - Initial Assessment Questions 1. BLOOD PRESSURE: "What is the blood pressure?" "Did you take at least two measurements 5 minutes apart?"     No. Pt is not at home. 2. ONSET: "When did you take your blood pressure?"     159/95 today at store 3. HOW: "How did you obtain the blood pressure?" (e.g., visiting nurse, automatic home BP monitor)     3 days ago at Drs.office ([facility])  167/117 4. HISTORY: "Do you have a history of high blood pressure?"     yes 5. MEDICATIONS: "Are you taking any medications for blood pressure?" "Have you missed any doses recently?"     No; "Not feeling well since meds changed."  6. OTHER SYMPTOMS: "Do you have any symptoms?" (e.g., headache, chest pain, blurred vision, difficulty breathing, weakness)     Headaches,intermittent;  fatigued, weak, lethargic in AMs  Protocols used: HIGH BLOOD PRESSURE-A-AH

## 2017-11-07 ENCOUNTER — Encounter: Payer: Self-pay | Admitting: Family Medicine

## 2017-11-07 ENCOUNTER — Ambulatory Visit (INDEPENDENT_AMBULATORY_CARE_PROVIDER_SITE_OTHER): Payer: Commercial Managed Care - PPO | Admitting: Family Medicine

## 2017-11-07 VITALS — BP 136/94 | HR 94 | Temp 98.3°F | Resp 16 | Ht 62.0 in | Wt 265.0 lb

## 2017-11-07 DIAGNOSIS — R0989 Other specified symptoms and signs involving the circulatory and respiratory systems: Secondary | ICD-10-CM

## 2017-11-07 DIAGNOSIS — H60501 Unspecified acute noninfective otitis externa, right ear: Secondary | ICD-10-CM | POA: Diagnosis not present

## 2017-11-07 DIAGNOSIS — I10 Essential (primary) hypertension: Secondary | ICD-10-CM | POA: Diagnosis not present

## 2017-11-07 MED ORDER — METOPROLOL TARTRATE 50 MG PO TABS: 50.0000 mg | ORAL_TABLET | Freq: Two times a day (BID) | ORAL | 3 refills | Status: DC

## 2017-11-07 MED ORDER — NEOMYCIN-POLYMYXIN-HC 3.5-10000-1 OT SOLN: 3.0000 [drp] | Freq: Three times a day (TID) | OTIC | 0 refills | Status: DC

## 2017-11-07 NOTE — Progress Notes (Signed)
OFFICE VISIT  11/07/2017   CC:  Chief Complaint  Patient presents with  . BP elevated   HPI:    Patient is a 52 y.o. Caucasian female with many chronic medical issues, including HTN, who presents for elevated blood pressures. Changed her BB to verapamil last visit 3 wks ago--no change in her breathing since this change. Has HAs and generalized "feeling yucky".  BP's up and down: as low as 80s/40s, some normal, some 160s/110s.  Also, with R ear pain onset this morning.  No drainage.  No fevers.  Past Medical History:  Diagnosis Date  . Anxiety   . Arthritis   . Bilateral leg pain 2018/19   ? venous reflux.  . Bipolar 1 disorder ([place])   . Bronchitis, mucopurulent recurrent ([place])    PFTs 04/2016 showed no sign of copd or asthma, plus she actually had worse FEV1 after albuterol.  . Chronic pain syndrome    chronic low back pain (Pain mgmt= Dr. [name]).  MRI showed no signif disc dz, only showed some facet arthropathy at L4-5, with joint diastasis at L4-5--plan as of 11/07/16 neurosurg eval is bilat facet injections.  . Colitis due to Clostridium difficile 2001  . Cyst of left kidney 2016   Initially noted at 3 cm size, simple appearing, on CT abd/pelv in 2016.  On MRI L spine w and w/out contrast it was larger, lobulated--->ultrasound was done for f/u and it was 4.1 cm w/out worrisome features.  Marland Kitchen GERD (gastroesophageal reflux disease)   . Hepatic hemangioma   . Hepatic steatosis 06/2016   Noted incidentally on CT chest  . History of Salmonella gastroenteritis   . Hyperlipidemia    a. Noted 02/2014.  Marland Kitchen Hypertension    HCTZ started 07/2015  . IBS (irritable bowel syndrome)   . Iron deficiency anemia 05/2014   per pt it is not due to vaginal blood loss; hemoccults sent to pt in mail 413/16.  . Lumbar spondylosis    mild, mainly focused at L4-5 and L5-S1.  MRI L spine 09/09/17-->mild degenerative disc disease and facet arthrosis without any nerve root encroachment, no spinal  stenosis.  . Microscopic hematuria    Intermittent (no w/u done yet, as of 03/2014)  . Migraine headache   . Morbid obesity ([place])   . Multiple lipomas 09/2016   left upper arm--very small ones.  . Nocturnal hypoxia 09/18/2017   Overnight oximetry -->qualified: ordered 2L oxygen during sleep.  . Obesity hypoventilation syndrome ([place])   . Peripheral edema Fall 2018   R>L, non-pitting for the most part--venous doppler neg for DVT 01/2017.  Saw Dr. Bridgett Larsson (Vasc) 04/12/17 and u/s showed some venous reflux dz but he felt her sx's in legs were NOT due to her venous insufficiency OR PAD.  Thigh high comp stockings rx'd.  . Polycystic ovarian syndrome   . Pruritic dermatitis 2015/2016   +Scabies prep at [facility] 07/2014. (Primarily pruritic skin, but eventually a subtle rash as well)  . Raynaud's phenomenon 2019  . Rectal cancer ([place])    a. Followed by Dr. Gala Romney, dx 2000-2001. Tumor removed from rectal.  . Recurrent sinusitis    +allergic rhinitis  . Right shoulder pain 2017   Murphy/Wainer: RC bursitis + RC tear (MRI)--injection trial is plan per pt report 05/04/16.  . Tobacco dependence   . Vitamin D deficiency     Past Surgical History:  Procedure Laterality Date  . ABI's complete Bilateral 09/30/2017   Normal ABIs.  [employer]  toe pressures adequate for wound healing.  Femoral artery waveforms triphasic (normal).  . CARDIOVASCULAR STRESS TEST  03/24/14   Lexscan MIBI: mild anterior ischemia?  Cardiac CT angiogram recommended/done.  . CHOLECYSTECTOMY    . COLONOSCOPY  05/05/2007   adenoma  . coronary CT angio  03/29/14   Two vessel dz/moderate stenosis of mid LAD and proximal RCA; cath recommended.  Marland Kitchen DILATION AND CURETTAGE OF UTERUS     for vaginal bleeding  . JOINT REPLACEMENT    . LEFT HEART CATHETERIZATION WITH CORONARY ANGIOGRAM N/A 03/31/2014   Normal coronaries, EF 95-18%, diastolic dysfunction.  Procedure: LEFT HEART CATHETERIZATION WITH CORONARY ANGIOGRAM;  Surgeon: Sinclair Grooms, MD;  Location: [facility] CATH LAB;  Service: Cardiovascular;  Laterality: N/A;  . LOWER EXT VENOUS DOPPLERS Bilateral 02/08/2017   NEG for DVT (bilat)  . Overnight oximetry testing  09/2017   qualified for oxygen supplementation during sleep  . PFTs  04/2016   No sign of COPD or asthma; FEV 1 worse after albut.  . Situ removed  age 75   High-grade rectal adenoma removed from rectum   . TRANSTHORACIC ECHOCARDIOGRAM  03/23/14; 10/21/17   2015 Normal.  2019: mild LVH, EF 65-70%, study technically inadequate to assess diastolic function, valves were fine, wall motion normal.    Outpatient Medications Prior to Visit  Medication Sig Dispense Refill  . albuterol (PROVENTIL HFA) 108 (90 Base) MCG/ACT inhaler Inhale 1-2 puffs into the lungs every 4 (four) hours as needed. 1 Inhaler 3  . albuterol (PROVENTIL) (2.5 MG/3ML) 0.083% nebulizer solution Take 3 mLs (2.5 mg total) by nebulization every 6 (six) hours as needed for wheezing or shortness of breath. 75 mL 6  . aspirin 81 MG chewable tablet Chew 1 tablet (81 mg total) by mouth daily. 14 tablet 0  . azelastine (ASTELIN) 0.1 % nasal spray Place 2 sprays into both nostrils 2 (two) times daily. 30 mL 6  . busPIRone (BUSPAR) 15 MG tablet 1 in the morning and 2 at bedtime    . cetirizine (ZYRTEC) 10 MG tablet TAKE ONE TABLET BY MOUTH ONCE DAILY 30 tablet 11  . clonazePAM (KLONOPIN) 0.5 MG tablet TAKE 1 TABLET BY MOUTH ONCE DAILY DURING THE  DAY AS NEEDED, THEN TAKE 2 TABLETS AT BEDTIME. 90 tablet 5  . cyclobenzaprine (FLEXERIL) 10 MG tablet TAKE ONE TABLET BY MOUTH EVERY 6 HOURS AS NEEDED (SPASM RELATED TO HEADACHES) (Patient taking differently: Take 10 mg by mouth 3 (three) times daily. TAKE ONE TABLET BY MOUTH EVERY 6 HOURS AS NEEDED (SPASM RELATED TO HEADACHES)) 42 tablet 5  . diphenhydrAMINE (BENADRYL) 25 MG tablet Take 1 tablet (25 mg total) by mouth every 6 (six) hours. (Patient taking differently: Take 25-50 mg by mouth every 6 (six) hours as  needed for itching or allergies. ) 20 tablet 0  . fluticasone (FLONASE) 50 MCG/ACT nasal spray Place 2 sprays into both nostrils daily. 16 g 1  . hydrOXYzine (ATARAX/VISTARIL) 25 MG tablet Take 25 mg by mouth 3 (three) times daily.     Marland Kitchen IRON-FOLIC ACID PO Take 65 mg by mouth daily.     Marland Kitchen LATUDA 40 MG TABS tablet TAKE 1 TABLET BY MOUTH ONCE DAILY IN THE EVENING WITH FOOD (AT LEAST 350 CALORIES)  1  . losartan (COZAAR) 50 MG tablet Take 1 tablet (50 mg total) by mouth daily. 30 tablet 3  . LYRICA 150 MG capsule Take 1 tablet by mouth 3 (three) times daily.  2  .  Magnesium-Potassium 40-40 MG CAPS Take 1 capsule by mouth daily.    Melynda Ripple Tosylate (SYMPROIC) 0.2 MG TABS Take 1 tablet by mouth daily.    . nitroGLYCERIN (NITROSTAT) 0.4 MG SL tablet Place 1 tablet (0.4 mg total) under the tongue every 5 (five) minutes as needed for chest pain. 20 tablet 0  . Oxcarbazepine (TRILEPTAL) 300 MG tablet Take 150 mg by mouth daily.    Marland Kitchen oxyCODONE-acetaminophen (PERCOCET) 10-325 MG tablet Take 1 tablet by mouth 3 (three) times daily.     . pantoprazole (PROTONIX) 40 MG tablet TAKE 1 TABLET BY MOUTH ONCE DAILY 90 tablet 1  . promethazine (PHENERGAN) 25 MG tablet TAKE 1 TABLET BY MOUTH EVERY 6 HOURS AS NEEDED FOR NAUSEA AND VOMITING 30 tablet 3  . ranitidine (ZANTAC) 150 MG tablet Take 150 mg by mouth 2 (two) times daily.    Marland Kitchen rOPINIRole (REQUIP) 0.25 MG tablet TAKE 3 TABLETS BY MOUTH ONCE DAILY AT BEDTIME 90 tablet 6  . SYMBICORT 160-4.5 MCG/ACT inhaler Inhale 2 puffs into the lungs 2 (two) times daily.  6  . verapamil (CALAN-SR) 120 MG CR tablet Take 1 tablet (120 mg total) by mouth at bedtime. 30 tablet 1   No facility-administered medications prior to visit.     Allergies  Allergen Reactions  . Contrast Media [Iodinated Diagnostic Agents] Anaphylaxis    Needs 13-hour prep  . Shellfish Allergy Anaphylaxis  . Codeine Nausea And Vomiting and Other (See Comments)    Hallucinations, Also sees people  that are not there   . Furosemide Rash    Rash, soB.  . Lamictal [Lamotrigine] Rash  . Ivermectin Nausea And Vomiting    N/V and rash  . Betadine [Povidone Iodine] Rash  . Latex Hives  . Lipitor [Atorvastatin] Itching  . Permethrin Itching  . Tape Dermatitis    Paper tape and clear    ROS As per HPI  PE: Blood pressure (!) 136/94, pulse 94, temperature 98.3 F (36.8 C), temperature source Oral, resp. rate 16, height 5\' 2"  (1.575 m), weight 265 lb (120.2 kg), last menstrual period 10/17/2017, SpO2 91 %. Gen: Alert, well appearing.  Patient is oriented to person, place, time, and situation. AFFECT: pleasant but morose, lucid thought and speech. VS: noted--normal. Gen: alert, NAD, NONTOXIC APPEARinG. HEENT: eyes without injection, drainage, or swelling.  Ears: EACs clear but R EAC has some mild erythema distally, TMs with normal light reflex and landmarks.  Nose: Clear rhinorrhea, with some dried, crusty exudate adherent to mildly injected mucosa.  No purulent d/c.  No paranasal sinus TTP.  No facial swelling.  Throat and mouth without focal lesion.  No pharyngial swelling, erythema, or exudate.     LABS:    Chemistry      Component Value Date/Time   NA 136 05/29/2017 1638   K 4.2 05/29/2017 1638   CL 101 05/29/2017 1638   CO2 22 05/29/2017 1638   BUN 11 05/29/2017 1638   CREATININE 0.71 05/29/2017 1638      Component Value Date/Time   CALCIUM 9.6 05/29/2017 1638   ALKPHOS 71 05/29/2017 1638   AST 21 05/29/2017 1638   ALT 17 05/29/2017 1638   BILITOT 0.9 05/29/2017 1638      IMPRESSION AND PLAN:  1) Labile bp and HR +generalized malaise since getting on verapamil. Her wheezing/SOB is not improved any since getting OFF metoprolol. We'll restart her metoprolol 50mg  bid that she was feeling well on before.  2) R acute otitis  externa--mild. Cortisporin otic gtts: 3 drops R ear tid x 7d.  An After Visit Summary was printed and given to the patient.  FOLLOW UP:  Return for Keep appt already arranged for 11/18/17.  Signed:  Crissie Sickles, MD           11/07/2017

## 2017-11-07 NOTE — Patient Instructions (Signed)
Stop your verapamil.  Restart your lopressor 50mg  twice per day.  I rx'd ear drops--sent to your pharmacy.

## 2017-11-11 ENCOUNTER — Encounter: Payer: Self-pay | Admitting: Family Medicine

## 2017-11-17 DIAGNOSIS — R0902 Hypoxemia: Secondary | ICD-10-CM | POA: Diagnosis not present

## 2017-11-17 DIAGNOSIS — J449 Chronic obstructive pulmonary disease, unspecified: Secondary | ICD-10-CM | POA: Diagnosis not present

## 2017-11-17 DIAGNOSIS — J441 Chronic obstructive pulmonary disease with (acute) exacerbation: Secondary | ICD-10-CM | POA: Diagnosis not present

## 2017-11-18 ENCOUNTER — Ambulatory Visit: Payer: Medicare Other | Admitting: Family Medicine

## 2017-11-18 ENCOUNTER — Other Ambulatory Visit: Payer: Self-pay | Admitting: Family Medicine

## 2017-11-20 ENCOUNTER — Ambulatory Visit: Payer: Commercial Managed Care - PPO | Admitting: Family Medicine

## 2017-11-21 ENCOUNTER — Ambulatory Visit: Payer: Commercial Managed Care - PPO | Admitting: Family Medicine

## 2017-12-11 ENCOUNTER — Other Ambulatory Visit: Payer: Self-pay

## 2017-12-11 ENCOUNTER — Telehealth: Payer: Self-pay | Admitting: *Deleted

## 2017-12-11 MED ORDER — VARENICLINE TARTRATE 1 MG PO TABS: 1.0000 mg | ORAL_TABLET | Freq: Two times a day (BID) | ORAL | 1 refills | Status: DC

## 2017-12-11 NOTE — Telephone Encounter (Signed)
Please advise. Thanks.  

## 2017-12-11 NOTE — Telephone Encounter (Signed)
Copied from [place] [phone]. Topic: Quick Communication - Rx Refill/Question >> Dec 11, 2017  9:27 AM Antonieta Iba C wrote: Medication: Chantix   Has the patient contacted their pharmacy? No   (Agent: If no, request that the patient contact the pharmacy for the refill.) (Agent: If yes, when and what did the pharmacy advise?)  Preferred Pharmacy (with phone number or street name): [facility] in [place]: Please be advised that RX refills may take up to 3 business days. We ask that you follow-up with your pharmacy.

## 2017-12-11 NOTE — Telephone Encounter (Signed)
Pt advised and voiced understanding.   

## 2017-12-18 DIAGNOSIS — R0902 Hypoxemia: Secondary | ICD-10-CM | POA: Diagnosis not present

## 2017-12-18 DIAGNOSIS — J449 Chronic obstructive pulmonary disease, unspecified: Secondary | ICD-10-CM | POA: Diagnosis not present

## 2017-12-18 DIAGNOSIS — J441 Chronic obstructive pulmonary disease with (acute) exacerbation: Secondary | ICD-10-CM | POA: Diagnosis not present

## 2017-12-30 DIAGNOSIS — R252 Cramp and spasm: Secondary | ICD-10-CM | POA: Diagnosis not present

## 2017-12-30 DIAGNOSIS — M79606 Pain in leg, unspecified: Secondary | ICD-10-CM | POA: Diagnosis not present

## 2017-12-30 DIAGNOSIS — G603 Idiopathic progressive neuropathy: Secondary | ICD-10-CM | POA: Diagnosis not present

## 2017-12-30 DIAGNOSIS — M797 Fibromyalgia: Secondary | ICD-10-CM | POA: Diagnosis not present

## 2018-01-17 DIAGNOSIS — R0902 Hypoxemia: Secondary | ICD-10-CM | POA: Diagnosis not present

## 2018-01-17 DIAGNOSIS — J441 Chronic obstructive pulmonary disease with (acute) exacerbation: Secondary | ICD-10-CM | POA: Diagnosis not present

## 2018-01-17 DIAGNOSIS — J449 Chronic obstructive pulmonary disease, unspecified: Secondary | ICD-10-CM | POA: Diagnosis not present

## 2018-01-21 ENCOUNTER — Other Ambulatory Visit: Payer: Self-pay | Admitting: Family Medicine

## 2018-01-23 ENCOUNTER — Encounter: Payer: Self-pay | Admitting: Family Medicine

## 2018-01-23 ENCOUNTER — Ambulatory Visit: Payer: Self-pay | Admitting: *Deleted

## 2018-01-23 ENCOUNTER — Ambulatory Visit (INDEPENDENT_AMBULATORY_CARE_PROVIDER_SITE_OTHER): Payer: Commercial Managed Care - PPO | Admitting: Family Medicine

## 2018-01-23 VITALS — BP 108/74 | HR 91 | Temp 98.8°F | Resp 16 | Ht 62.0 in | Wt 269.0 lb

## 2018-01-23 DIAGNOSIS — J209 Acute bronchitis, unspecified: Secondary | ICD-10-CM

## 2018-01-23 DIAGNOSIS — R062 Wheezing: Secondary | ICD-10-CM

## 2018-01-23 DIAGNOSIS — J309 Allergic rhinitis, unspecified: Secondary | ICD-10-CM | POA: Diagnosis not present

## 2018-01-23 DIAGNOSIS — J019 Acute sinusitis, unspecified: Secondary | ICD-10-CM

## 2018-01-23 MED ORDER — IPRATROPIUM-ALBUTEROL 0.5-2.5 (3) MG/3ML IN SOLN
3.0000 mL | Freq: Once | RESPIRATORY_TRACT | Status: AC
  Administered 2018-01-23: 3 mL via RESPIRATORY_TRACT

## 2018-01-23 MED ORDER — AMOXICILLIN-POT CLAVULANATE 875-125 MG PO TABS: 1.0000 | ORAL_TABLET | Freq: Two times a day (BID) | ORAL | 0 refills | Status: DC

## 2018-01-23 MED ORDER — PREDNISONE 10 MG PO TABS: ORAL_TABLET | ORAL | 0 refills | Status: DC

## 2018-01-23 NOTE — Telephone Encounter (Signed)
Pt called with complaints of cough, and shortness of breath; she says that she has gre saiden sputum; the pt says that she is using her nebulizer every 4-6 hours; she the cough started 4-6 weeks ago; the shortness breath is an ongoing problem the pt says that she is to wear oxygen at bedtime but she has been using it during the day also;; recommendations made per nurse triage protocol; the pt requests to see Dr Anitra Lauth only; pt offered and accepted appointment with Dr Anitra Lauth, [place], 01/23/18 at 1300; she verbalized understanding; will route to office for notification of this upcoming appointment.    Reason for Disposition . SEVERE coughing spells (e.g., whooping sound after coughing, vomiting after coughing)  Answer Assessment - Initial Assessment Questions 1. ONSET: "When did the cough begin?"      01/09/18 2. SEVERITY: "How bad is the cough today?"      Severe; can't sleep 3. RESPIRATORY DISTRESS: "Describe your breathing."      Ongoing shortness of breath 4. FEVER: "Do you have a fever?" If so, ask: "What is your temperature, how was it measured, and when did it start?"     100.0 digital oral thermometer 5. SPUTUM: "Describe the color of your sputum" (clear, white, yellow, green)     green 6. HEMOPTYSIS: "Are you coughing up any blood?" If so ask: "How much?" (flecks, streaks, tablespoons, etc.)     Flecks of mucus in secretions 7. CARDIAC HISTORY: "Do you have any history of heart disease?" (e.g., heart attack, congestive heart failure)      High blood pressure 8. LUNG HISTORY: "Do you have any history of lung disease?"  (e.g., pulmonary embolus, asthma, emphysema)     "trapped air in lungs"; oxygen at night (has been using oxygen during the day to help)   9. PE RISK FACTORS: "Do you have a history of blood clots?" (or: recent major surgery, recent prolonged travel, bedridden)     no 10. OTHER SYMPTOMS: "Do you have any other symptoms?" (e.g., runny nose, wheezing, chest pain)     Wheezing, runny nose, originally had chills but now feels hot 11. PREGNANCY: "Is there any chance you are pregnant?" "When was your last menstrual period?"       No LMP 12/05/17 12. TRAVEL: "Have you traveled out of the country in the last month?" (e.g., travel history, exposures)       no  Protocols used: [place]

## 2018-01-23 NOTE — Progress Notes (Signed)
OFFICE VISIT  01/23/2018   CC:  Chief Complaint  Patient presents with  . Cough   HPI:    Patient is a 52 y.o. Caucasian female current smoker with a history of RAD/bronchitis, OHS, and nocturnal hypoxia who presents accompanied by her husband for respiratory complaints. Cough onset 4-5 wks ago, gradually worsening, lots of green mucous as PND and productive cough.  Lots of nasal congestion, sneezing, eyes itching/swelling.  Subjective f/c and malaise at onset.  Tm 100. Wheezing.  Using albut HFA, some help.  Last 2d using alb neb q4 for wheezing and makes cough more productive. No n/v/d.  Drinking fluids fine.  Tylenol cold/mucous severe being tried and not helping. She is wearing her nocturnal oxygen supplement during the day AND night the last couple days. Still smoking, maybe even more than her baseline. Most recent albut neb was 5 hours ago.  No chest pain.  No dizziness.  No palpitations. She has chronic widespread body pain and chronic fatigue.  Past Medical History:  Diagnosis Date  . Anxiety   . Arthritis   . Bilateral leg pain 2018/19   ? venous reflux.  . Bipolar 1 disorder ([place])   . Bronchitis, mucopurulent recurrent ([place])    PFTs 04/2016 showed no sign of copd or asthma, plus she actually had worse FEV1 after albuterol.  . Chronic pain syndrome    chronic low back pain (Pain mgmt= Dr. [name]).  MRI showed no signif disc dz, only showed some facet arthropathy at L4-5, with joint diastasis at L4-5--plan as of 11/07/16 neurosurg eval is bilat facet injections.  . Colitis due to Clostridium difficile 2001  . Cyst of left kidney 2016   Initially noted at 3 cm size, simple appearing, on CT abd/pelv in 2016.  On MRI L spine w and w/out contrast it was larger, lobulated--->ultrasound was done for f/u and it was 4.1 cm w/out worrisome features.  Marland Kitchen GERD (gastroesophageal reflux disease)   . Hepatic hemangioma   . Hepatic steatosis 06/2016   Noted incidentally on CT chest   . History of Salmonella gastroenteritis   . Hyperlipidemia    a. Noted 02/2014.  Marland Kitchen Hypertension    HCTZ started 07/2015  . IBS (irritable bowel syndrome)   . Iron deficiency anemia 05/2014   per pt it is not due to vaginal blood loss; hemoccults sent to pt in mail 413/16.  . Lumbar spondylosis    mild, mainly focused at L4-5 and L5-S1.  MRI L spine 09/09/17-->mild degenerative disc disease and facet arthrosis without any nerve root encroachment, no spinal stenosis.  . Microscopic hematuria    Intermittent (no w/u done yet, as of 03/2014)  . Migraine headache   . Morbid obesity ([place])   . Multiple lipomas 09/2016   left upper arm--very small ones.  . Nocturnal hypoxia 09/18/2017   Overnight oximetry -->qualified: ordered 2L oxygen during sleep.  . Obesity hypoventilation syndrome ([place])   . Pain in both lower legs 2018-2019   pain and numbness bilat LLs.  NCS/EMGs findings suggestive of S1 radiculopathy bilat: sx's + NCS findings suggestive of neurogenic claudication/spinal stenosis.  . Peripheral edema Fall 2018   R>L, non-pitting for the most part--venous doppler neg for DVT 01/2017.  Saw Dr. Bridgett Larsson ([facility]) 04/12/17 and u/s showed some venous reflux dz but he felt her sx's in legs were NOT due to her venous insufficiency OR PAD.  Thigh high comp stockings rx'd.  . Polycystic ovarian syndrome   . Pruritic dermatitis  2015/2016   +Scabies prep at [facility] 07/2014. (Primarily pruritic skin, but eventually a subtle rash as well)  . Raynaud's phenomenon 2019  . Rectal cancer ([place])    a. Followed by Dr. Gala Romney, dx 2000-2001. Tumor removed from rectal.  . Recurrent sinusitis    +allergic rhinitis  . Right shoulder pain 2017   [facility]: RC bursitis + RC tear (MRI)--injection trial is plan per pt report 05/04/16.  . Tobacco dependence   . Vitamin D deficiency     Past Surgical History:  Procedure Laterality Date  . ABI's complete Bilateral 09/30/2017   Normal ABIs.  Great toe  pressures adequate for wound healing.  Femoral artery waveforms triphasic (normal).  . CARDIOVASCULAR STRESS TEST  03/24/14   Lexscan MIBI: mild anterior ischemia?  Cardiac CT angiogram recommended/done.  . CHOLECYSTECTOMY    . COLONOSCOPY  05/05/2007   adenoma  . coronary CT angio  03/29/14   Two vessel dz/moderate stenosis of mid LAD and proximal RCA; cath recommended.  Marland Kitchen DILATION AND CURETTAGE OF UTERUS     for vaginal bleeding  . JOINT REPLACEMENT    . LEFT HEART CATHETERIZATION WITH CORONARY ANGIOGRAM N/A 03/31/2014   Normal coronaries, EF 29-51%, diastolic dysfunction.  Procedure: LEFT HEART CATHETERIZATION WITH CORONARY ANGIOGRAM;  Surgeon: Sinclair Grooms, MD;  Location: [facility] CATH LAB;  Service: Cardiovascular;  Laterality: N/A;  . LOWER EXT VENOUS DOPPLERS Bilateral 02/08/2017   NEG for DVT (bilat)  . NCS/EMG  10/03/2017   S1 radiculopathy bilat  . Overnight oximetry testing  09/2017   qualified for oxygen supplementation during sleep  . PFTs  04/2016   No sign of COPD or asthma; FEV 1 worse after albut.  . Situ removed  age 46   High-grade rectal adenoma removed from rectum   . TRANSTHORACIC ECHOCARDIOGRAM  03/23/14; 10/21/17   2015 Normal.  2019: mild LVH, EF 65-70%, study technically inadequate to assess diastolic function, valves were fine, wall motion normal.    Outpatient Medications Prior to Visit  Medication Sig Dispense Refill  . albuterol (PROVENTIL HFA) 108 (90 Base) MCG/ACT inhaler Inhale 1-2 puffs into the lungs every 4 (four) hours as needed. 1 Inhaler 3  . albuterol (PROVENTIL) (2.5 MG/3ML) 0.083% nebulizer solution Take 3 mLs (2.5 mg total) by nebulization every 6 (six) hours as needed for wheezing or shortness of breath. 75 mL 6  . AMITIZA 24 MCG capsule Take 24 mcg by mouth 2 (two) times daily.  1  . aspirin 81 MG chewable tablet Chew 1 tablet (81 mg total) by mouth daily. 14 tablet 0  . azelastine (ASTELIN) 0.1 % nasal spray Place 2 sprays into both nostrils 2  (two) times daily. 30 mL 6  . busPIRone (BUSPAR) 15 MG tablet 1 in the morning and 2 at bedtime    . cetirizine (ZYRTEC) 10 MG tablet TAKE 1 TABLET BY MOUTH ONCE DAILY 30 tablet 11  . clonazePAM (KLONOPIN) 0.5 MG tablet TAKE 1 TABLET BY MOUTH ONCE DAILY DURING THE  DAY AS NEEDED, THEN TAKE 2 TABLETS AT BEDTIME. 90 tablet 5  . cyclobenzaprine (FLEXERIL) 10 MG tablet TAKE ONE TABLET BY MOUTH EVERY 6 HOURS AS NEEDED (SPASM RELATED TO HEADACHES) (Patient taking differently: Take 10 mg by mouth 3 (three) times daily. TAKE ONE TABLET BY MOUTH EVERY 6 HOURS AS NEEDED (SPASM RELATED TO HEADACHES)) 42 tablet 5  . diphenhydrAMINE (BENADRYL) 25 MG tablet Take 1 tablet (25 mg total) by mouth every 6 (six)  hours. (Patient taking differently: Take 25-50 mg by mouth every 6 (six) hours as needed for itching or allergies. ) 20 tablet 0  . fluticasone (FLONASE) 50 MCG/ACT nasal spray Place 2 sprays into both nostrils daily. 16 g 1  . hydrOXYzine (ATARAX/VISTARIL) 25 MG tablet Take 25 mg by mouth 3 (three) times daily.     Marland Kitchen IRON-FOLIC ACID PO Take 65 mg by mouth daily.     Marland Kitchen LATUDA 40 MG TABS tablet TAKE 1 TABLET BY MOUTH ONCE DAILY IN THE EVENING WITH FOOD (AT LEAST 350 CALORIES)  1  . losartan (COZAAR) 50 MG tablet TAKE 1 TABLET BY MOUTH ONCE DAILY **REPLACES  TELMISARTAN** 90 tablet 1  . LYRICA 150 MG capsule Take 1 tablet by mouth 3 (three) times daily.  2  . Magnesium-Potassium 40-40 MG CAPS Take 1 capsule by mouth daily.    . meloxicam (MOBIC) 15 MG tablet Take 15 mg by mouth daily.  1  . metoprolol tartrate (LOPRESSOR) 50 MG tablet Take 1 tablet (50 mg total) by mouth 2 (two) times daily. 180 tablet 3  . Naldemedine Tosylate (SYMPROIC) 0.2 MG TABS Take 1 tablet by mouth daily.    . nitroGLYCERIN (NITROSTAT) 0.4 MG SL tablet Place 1 tablet (0.4 mg total) under the tongue every 5 (five) minutes as needed for chest pain. 20 tablet 0  . Oxcarbazepine (TRILEPTAL) 300 MG tablet Take 150 mg by mouth daily.    Marland Kitchen  oxyCODONE-acetaminophen (PERCOCET) 10-325 MG tablet Take 1 tablet by mouth 3 (three) times daily.     . pantoprazole (PROTONIX) 40 MG tablet TAKE 1 TABLET BY MOUTH ONCE DAILY 90 tablet 1  . promethazine (PHENERGAN) 25 MG tablet TAKE 1 TABLET BY MOUTH EVERY 6 HOURS AS NEEDED FOR NAUSEA AND VOMITING 30 tablet 3  . rOPINIRole (REQUIP) 0.25 MG tablet TAKE 3 TABLETS BY MOUTH ONCE DAILY AT BEDTIME 90 tablet 6  . neomycin-polymyxin-hydrocortisone (CORTISPORIN) OTIC solution Place 3 drops into the right ear 3 (three) times daily. For 7 days (Patient not taking: Reported on 01/23/2018) 10 mL 0  . ranitidine (ZANTAC) 150 MG tablet Take 150 mg by mouth 2 (two) times daily.    . SYMBICORT 160-4.5 MCG/ACT inhaler Inhale 2 puffs into the lungs 2 (two) times daily.  6  . varenicline (CHANTIX CONTINUING MONTH PAK) 1 MG tablet Take 1 tablet (1 mg total) by mouth 2 (two) times daily. (Patient not taking: Reported on 01/23/2018) 60 tablet 1   No facility-administered medications prior to visit.     Allergies  Allergen Reactions  . Contrast Media [Iodinated Diagnostic Agents] Anaphylaxis    Needs 13-hour prep  . Shellfish Allergy Anaphylaxis  . Codeine Nausea And Vomiting and Other (See Comments)    Hallucinations, Also sees people that are not there   . Furosemide Rash    Rash, soB.  . Lamictal [Lamotrigine] Rash  . Ivermectin Nausea And Vomiting    N/V and rash  . Betadine [Povidone Iodine] Rash  . Latex Hives  . Lipitor [Atorvastatin] Itching  . Permethrin Itching  . Tape Dermatitis    Paper tape and clear    ROS As per HPI  PE: Blood pressure 108/74, pulse 91, temperature 98.8 F (37.1 C), temperature source Oral, resp. rate 16, height 5\' 2"  (1.575 m), weight 269 lb (122 kg), last menstrual period 12/05/2017, SpO2 93 %. Gen: alert, tired appearing but NAD.   AFFECT: flat, morose, but with lucid thought and speech. HEENT: eyes without injection,  drainage, or swelling.  Ears: EACs clear, TMs  with normal light reflex and landmarks.  Nose: Clear rhinorrhea, with some dried, crusty exudate adherent to mildly injected mucosa.  No purulent d/c.  Diffuse paranasal and frontal sinus TTP.  No facial swelling.  Throat and mouth without focal lesion.  No pharyngial swelling, erythema, or exudate.   Neck: supple, no LAD.   LUNGS: Clear but diminished BS on inspiration, with diffuse coarse exp wheeze and prolonged exp phase---at least part of her wheezing is pseudo-wheeze from upper airway location, nonlabored resps.   CV: RRR, no m/r/g. EXT: no c/c/e SKIN: no rash  LABS:    Chemistry      Component Value Date/Time   NA 136 05/29/2017 1638   K 4.2 05/29/2017 1638   CL 101 05/29/2017 1638   CO2 22 05/29/2017 1638   BUN 11 05/29/2017 1638   CREATININE 0.71 05/29/2017 1638      Component Value Date/Time   CALCIUM 9.6 05/29/2017 1638   ALKPHOS 71 05/29/2017 1638   AST 21 05/29/2017 1638   ALT 17 05/29/2017 1638   BILITOT 0.9 05/29/2017 1638     Lab Results  Component Value Date   WBC 9.4 05/29/2017   HGB 16.6 (H) 05/29/2017   HCT 49.8 (H) 05/29/2017   MCV 89.4 05/29/2017   PLT 298 05/29/2017   Lab Results  Component Value Date   HGBA1C 5.9 (H) 03/23/2014    IMPRESSION AND PLAN:  Prolonged URI/allergic rhinitis with acute bronchitis in a long term smoker with OHS and nocturnal hypoxia. Of course, I encouraged smoking cessation but she is not contemplating it at this time.  This is the only thing she really feels that she has to use for stress relief per her report in the past. Plan: prednisone 50 mg qd x 4d, then 40 mg qd x 4d, then 30 mg qd x 4d, then 20 mg qd x 4d, then 10mg  qd x 4d, then stop. Augmentin 875 mg bid x 10d. Get otc generic robitussin DM OR Mucinex DM and use as directed on the packaging for cough and congestion. Use otc generic saline nasal spray 2-3 times per day to irrigate/moisturize your nasal passages. Pt declined flu vaccine today.  An After Visit  Summary was printed and given to the patient.  FOLLOW UP: Return in about 1 week (around 01/30/2018) for f/u bronchitis.  Signed:  Crissie Sickles, MD           01/23/2018

## 2018-01-23 NOTE — Telephone Encounter (Signed)
Noted  

## 2018-01-30 ENCOUNTER — Encounter: Payer: Self-pay | Admitting: Family Medicine

## 2018-01-30 ENCOUNTER — Ambulatory Visit (INDEPENDENT_AMBULATORY_CARE_PROVIDER_SITE_OTHER): Payer: Commercial Managed Care - PPO | Admitting: Family Medicine

## 2018-01-30 VITALS — BP 105/70 | HR 83 | Temp 98.1°F | Resp 16 | Ht 62.0 in | Wt 269.0 lb

## 2018-01-30 DIAGNOSIS — J4541 Moderate persistent asthma with (acute) exacerbation: Secondary | ICD-10-CM

## 2018-01-30 DIAGNOSIS — G43009 Migraine without aura, not intractable, without status migrainosus: Secondary | ICD-10-CM | POA: Diagnosis not present

## 2018-01-30 DIAGNOSIS — J019 Acute sinusitis, unspecified: Secondary | ICD-10-CM

## 2018-01-30 LAB — CBC WITH DIFFERENTIAL/PLATELET
Basophils Absolute: 0.1 10*3/uL (ref 0.0–0.1)
Basophils Relative: 0.5 % (ref 0.0–3.0)
Eosinophils Absolute: 0.3 10*3/uL (ref 0.0–0.7)
Eosinophils Relative: 2.3 % (ref 0.0–5.0)
HCT: 42.6 % (ref 36.0–46.0)
Hemoglobin: 14.5 g/dL (ref 12.0–15.0)
Lymphocytes Relative: 25.7 % (ref 12.0–46.0)
Lymphs Abs: 3 10*3/uL (ref 0.7–4.0)
MCHC: 34 g/dL (ref 30.0–36.0)
MCV: 88.2 fl (ref 78.0–100.0)
Monocytes Absolute: 0.4 10*3/uL (ref 0.1–1.0)
Monocytes Relative: 3.5 % (ref 3.0–12.0)
Neutro Abs: 8.1 10*3/uL — ABNORMAL HIGH (ref 1.4–7.7)
Neutrophils Relative %: 68 % (ref 43.0–77.0)
Platelets: 368 10*3/uL (ref 150.0–400.0)
RBC: 4.82 Mil/uL (ref 3.87–5.11)
RDW: 14.7 % (ref 11.5–15.5)
WBC: 11.9 10*3/uL — ABNORMAL HIGH (ref 4.0–10.5)

## 2018-01-30 LAB — SEDIMENTATION RATE: Sed Rate: 33 mm/hr — ABNORMAL HIGH (ref 0–30)

## 2018-01-30 NOTE — Progress Notes (Signed)
OFFICE VISIT  01/30/2018   CC:  Chief Complaint  Patient presents with  . Follow-up    bronchitis   HPI:    Patient is a 52 y.o. Caucasian female who presents for 1 week f/u acute exacerbation of asthma and acute sinusitis (allergic + infectious). I put her on a 3 week prednisone taper and augmentin x 10d.  Interim Hx: She is improved.  Down to only qd albuterol nebs. Delsym/robitt helping with cough.  Nasal congestion/sinus pressure improved.   Has developed a severe HA in the last couple hours, left sided, forehead/temple/parietal area.  Throbbing, sometimes up to 8/10 intensity, +photophobia, vague feeling that the left side of her body "will go dark".  Denies any visual symptoms. Her typical migraine HAs are not like this.  No nausea.  No eyelid droop and no eye watering/tearing. No neck pain/stiffness.  The pain did not start very suddenly.  Past Medical History:  Diagnosis Date  . Anxiety   . Arthritis   . Bilateral leg pain 2018/19   ? venous reflux.  . Bipolar 1 disorder ([place])   . Bronchitis, mucopurulent recurrent ([place])    PFTs 04/2016 showed no sign of copd or asthma, plus she actually had worse FEV1 after albuterol.  . Chronic pain syndrome    chronic low back pain (Pain mgmt= Dr. [name]).  MRI showed no signif disc dz, only showed some facet arthropathy at L4-5, with joint diastasis at L4-5--plan as of 11/07/16 neurosurg eval is bilat facet injections.  . Colitis due to Clostridium difficile 2001  . Cyst of left kidney 2016   Initially noted at 3 cm size, simple appearing, on CT abd/pelv in 2016.  On MRI L spine w and w/out contrast it was larger, lobulated--->ultrasound was done for f/u and it was 4.1 cm w/out worrisome features.  Marland Kitchen GERD (gastroesophageal reflux disease)   . Hepatic hemangioma   . Hepatic steatosis 06/2016   Noted incidentally on CT chest  . History of Salmonella gastroenteritis   . Hyperlipidemia    a. Noted 02/2014.  Marland Kitchen Hypertension    HCTZ  started 07/2015  . IBS (irritable bowel syndrome)   . Iron deficiency anemia 05/2014   per pt it is not due to vaginal blood loss; hemoccults sent to pt in mail 413/16.  . Lumbar spondylosis    mild, mainly focused at L4-5 and L5-S1.  MRI L spine 09/09/17-->mild degenerative disc disease and facet arthrosis without any nerve root encroachment, no spinal stenosis.  . Microscopic hematuria    Intermittent (no w/u done yet, as of 03/2014)  . Migraine headache   . Morbid obesity ([place])   . Multiple lipomas 09/2016   left upper arm--very small ones.  . Nocturnal hypoxia 09/18/2017   Overnight oximetry -->qualified: ordered 2L oxygen during sleep.  . Obesity hypoventilation syndrome ([place])   . Pain in both lower legs 2018-2019   pain and numbness bilat LLs.  NCS/EMGs findings suggestive of S1 radiculopathy bilat: sx's + NCS findings suggestive of neurogenic claudication/spinal stenosis.  . Peripheral edema Fall 2018   R>L, non-pitting for the most part--venous doppler neg for DVT 01/2017.  Saw Dr. Bridgett Larsson (Vasc) 04/12/17 and u/s showed some venous reflux dz but he felt her sx's in legs were NOT due to her venous insufficiency OR PAD.  Thigh high comp stockings rx'd.  . Polycystic ovarian syndrome   . Pruritic dermatitis 2015/2016   +Scabies prep at [facility] 07/2014. (Primarily pruritic skin, but eventually  a subtle rash as well)  . Raynaud's phenomenon 2019  . Rectal cancer ([place])    a. Followed by Dr. Gala Romney, dx 2000-2001. Tumor removed from rectal.  . Recurrent sinusitis    +allergic rhinitis  . Right shoulder pain 2017   Murphy/Wainer: RC bursitis + RC tear (MRI)--injection trial is plan per pt report 05/04/16.  . Tobacco dependence   . Vitamin D deficiency     Past Surgical History:  Procedure Laterality Date  . ABI's complete Bilateral 09/30/2017   Normal ABIs.  Great toe pressures adequate for wound healing.  Femoral artery waveforms triphasic (normal).  . CARDIOVASCULAR STRESS TEST   03/24/14   Lexscan MIBI: mild anterior ischemia?  Cardiac CT angiogram recommended/done.  . CHOLECYSTECTOMY    . COLONOSCOPY  05/05/2007   adenoma  . coronary CT angio  03/29/14   Two vessel dz/moderate stenosis of mid LAD and proximal RCA; cath recommended.  Marland Kitchen DILATION AND CURETTAGE OF UTERUS     for vaginal bleeding  . JOINT REPLACEMENT    . LEFT HEART CATHETERIZATION WITH CORONARY ANGIOGRAM N/A 03/31/2014   Normal coronaries, EF 65-03%, diastolic dysfunction.  Procedure: LEFT HEART CATHETERIZATION WITH CORONARY ANGIOGRAM;  Surgeon: Sinclair Grooms, MD;  Location: [facility] CATH LAB;  Service: Cardiovascular;  Laterality: N/A;  . LOWER EXT VENOUS DOPPLERS Bilateral 02/08/2017   NEG for DVT (bilat)  . NCS/EMG  10/03/2017   S1 radiculopathy bilat  . Overnight oximetry testing  09/2017   qualified for oxygen supplementation during sleep  . PFTs  04/2016   No sign of COPD or asthma; FEV 1 worse after albut.  . Situ removed  age 67   High-grade rectal adenoma removed from rectum   . TRANSTHORACIC ECHOCARDIOGRAM  03/23/14; 10/21/17   2015 Normal.  2019: mild LVH, EF 65-70%, study technically inadequate to assess diastolic function, valves were fine, wall motion normal.    Outpatient Medications Prior to Visit  Medication Sig Dispense Refill  . albuterol (PROVENTIL HFA) 108 (90 Base) MCG/ACT inhaler Inhale 1-2 puffs into the lungs every 4 (four) hours as needed. 1 Inhaler 3  . albuterol (PROVENTIL) (2.5 MG/3ML) 0.083% nebulizer solution Take 3 mLs (2.5 mg total) by nebulization every 6 (six) hours as needed for wheezing or shortness of breath. 75 mL 6  . AMITIZA 24 MCG capsule Take 24 mcg by mouth 2 (two) times daily.  1  . amoxicillin-clavulanate (AUGMENTIN) 875-125 MG tablet Take 1 tablet by mouth 2 (two) times daily. 20 tablet 0  . aspirin 81 MG chewable tablet Chew 1 tablet (81 mg total) by mouth daily. 14 tablet 0  . azelastine (ASTELIN) 0.1 % nasal spray Place 2 sprays into both nostrils 2  (two) times daily. 30 mL 6  . busPIRone (BUSPAR) 15 MG tablet 1 in the morning and 2 at bedtime    . cetirizine (ZYRTEC) 10 MG tablet TAKE 1 TABLET BY MOUTH ONCE DAILY 30 tablet 11  . clonazePAM (KLONOPIN) 0.5 MG tablet TAKE 1 TABLET BY MOUTH ONCE DAILY DURING THE  DAY AS NEEDED, THEN TAKE 2 TABLETS AT BEDTIME. 90 tablet 5  . cyclobenzaprine (FLEXERIL) 10 MG tablet TAKE ONE TABLET BY MOUTH EVERY 6 HOURS AS NEEDED (SPASM RELATED TO HEADACHES) (Patient taking differently: Take 10 mg by mouth 3 (three) times daily. TAKE ONE TABLET BY MOUTH EVERY 6 HOURS AS NEEDED (SPASM RELATED TO HEADACHES)) 42 tablet 5  . diphenhydrAMINE (BENADRYL) 25 MG tablet Take 1 tablet (25 mg total) by  mouth every 6 (six) hours. (Patient taking differently: Take 25-50 mg by mouth every 6 (six) hours as needed for itching or allergies. ) 20 tablet 0  . fluticasone (FLONASE) 50 MCG/ACT nasal spray Place 2 sprays into both nostrils daily. 16 g 1  . hydrOXYzine (ATARAX/VISTARIL) 25 MG tablet Take 25 mg by mouth 3 (three) times daily.     Marland Kitchen IRON-FOLIC ACID PO Take 65 mg by mouth daily.     Marland Kitchen LATUDA 40 MG TABS tablet TAKE 1 TABLET BY MOUTH ONCE DAILY IN THE EVENING WITH FOOD (AT LEAST 350 CALORIES)  1  . losartan (COZAAR) 50 MG tablet TAKE 1 TABLET BY MOUTH ONCE DAILY **REPLACES  TELMISARTAN** 90 tablet 1  . LYRICA 150 MG capsule Take 1 tablet by mouth 3 (three) times daily.  2  . Magnesium-Potassium 40-40 MG CAPS Take 1 capsule by mouth daily.    . meloxicam (MOBIC) 15 MG tablet Take 15 mg by mouth daily.  1  . metoprolol tartrate (LOPRESSOR) 50 MG tablet Take 1 tablet (50 mg total) by mouth 2 (two) times daily. 180 tablet 3  . Naldemedine Tosylate (SYMPROIC) 0.2 MG TABS Take 1 tablet by mouth daily.    . nitroGLYCERIN (NITROSTAT) 0.4 MG SL tablet Place 1 tablet (0.4 mg total) under the tongue every 5 (five) minutes as needed for chest pain. 20 tablet 0  . Oxcarbazepine (TRILEPTAL) 300 MG tablet Take 150 mg by mouth daily.    Marland Kitchen  oxyCODONE-acetaminophen (PERCOCET) 10-325 MG tablet Take 1 tablet by mouth 3 (three) times daily.     . pantoprazole (PROTONIX) 40 MG tablet TAKE 1 TABLET BY MOUTH ONCE DAILY 90 tablet 1  . predniSONE (DELTASONE) 10 MG tablet 5 tabs po qd x 4d, then 4 tabs po qd x 4d, then 3 tabs po qd x 4d, then 2 tabs po qd x 4d, then 1 tab po qd x 4d, then stop 60 tablet 0  . promethazine (PHENERGAN) 25 MG tablet TAKE 1 TABLET BY MOUTH EVERY 6 HOURS AS NEEDED FOR NAUSEA AND VOMITING 30 tablet 3  . rOPINIRole (REQUIP) 0.25 MG tablet TAKE 3 TABLETS BY MOUTH ONCE DAILY AT BEDTIME 90 tablet 6   No facility-administered medications prior to visit.     Allergies  Allergen Reactions  . Contrast Media [Iodinated Diagnostic Agents] Anaphylaxis    Needs 13-hour prep  . Shellfish Allergy Anaphylaxis  . Codeine Nausea And Vomiting and Other (See Comments)    Hallucinations, Also sees people that are not there   . Furosemide Rash    Rash, soB.  . Lamictal [Lamotrigine] Rash  . Ivermectin Nausea And Vomiting    N/V and rash  . Betadine [Povidone Iodine] Rash  . Latex Hives  . Lipitor [Atorvastatin] Itching  . Permethrin Itching  . Tape Dermatitis    Paper tape and clear    ROS As per HPI  PE: Blood pressure 105/70, pulse 83, temperature 98.1 F (36.7 C), temperature source Oral, resp. rate 16, height '5\' 2"'  (1.575 m), weight 269 lb (122 kg), last menstrual period 01/24/2018, SpO2 92 %. Gen: Alert, well appearing.  Patient is oriented to person, place, time, and situation. AFFECT: pleasant, lucid thought and speech. ENT: Ears: EACs clear, normal epithelium.  TMs with good light reflex and landmarks bilaterally.  Eyes: no injection, icteris, swelling, or exudate.  EOMI, PERRLA. Nose: no drainage or turbinate edema/swelling.  No injection or focal lesion.  Mouth: lips without lesion/swelling.  Oral mucosa pink  and moist.  Dentition intact and without obvious caries or gingival swelling.  Oropharynx without  erythema, exudate, or swelling.  Neck - No masses or thyromegaly or limitation in range of motion CV: RRR, no m/r/g.   LUNGS: CTA bilat, nonlabored resps, good aeration in all lung fields. Neuro: CN 2-12 intact bilaterally, strength 5/5 in proximal and distal upper extremities and lower extremities bilaterally.  No tremor.  FNF normal bilat.  No ataxia.  Upper extremity and lower extremity DTRs symmetric.  No pronator drift. Left temple with mild TTP just in a couple cm area superior to the TM joint.  No erythema.   LABS:    Chemistry      Component Value Date/Time   NA 136 05/29/2017 1638   K 4.2 05/29/2017 1638   CL 101 05/29/2017 1638   CO2 22 05/29/2017 1638   BUN 11 05/29/2017 1638   CREATININE 0.71 05/29/2017 1638      Component Value Date/Time   CALCIUM 9.6 05/29/2017 1638   ALKPHOS 71 05/29/2017 1638   AST 21 05/29/2017 1638   ALT 17 05/29/2017 1638   BILITOT 0.9 05/29/2017 1638      IMPRESSION AND PLAN:  1) Acute exac of asthma, improving appropriately.  Acute sinusitis much improved. Finish steroid taper and abx.  Continue albut neb q6h prn.  2) Acute L sided HA: unusual HA for her.  Atypical migraine suspected. Low suspicion of temporal arteritis, but will check ESR today, +CBC. No NSAIDs since she is on meloxicam daily now. Tylenol 1000 mg PO given in office today. Rest this afternoon in dark environment. Call if HA is unrelenting in 24h or if getting worse or develop accompanying sx's such as stiff neck, n/v, fever, focal weakness, speech problems, or vision abnormality.  An After Visit Summary was printed and given to the patient.  FOLLOW UP: Return if symptoms worsen or fail to improve.  Signed:  Crissie Sickles, MD           01/30/2018

## 2018-02-17 DIAGNOSIS — J441 Chronic obstructive pulmonary disease with (acute) exacerbation: Secondary | ICD-10-CM | POA: Diagnosis not present

## 2018-02-17 DIAGNOSIS — J449 Chronic obstructive pulmonary disease, unspecified: Secondary | ICD-10-CM | POA: Diagnosis not present

## 2018-02-17 DIAGNOSIS — R0902 Hypoxemia: Secondary | ICD-10-CM | POA: Diagnosis not present

## 2018-02-22 ENCOUNTER — Other Ambulatory Visit: Payer: Self-pay | Admitting: Family Medicine

## 2018-03-05 DIAGNOSIS — F3181 Bipolar II disorder: Secondary | ICD-10-CM | POA: Diagnosis not present

## 2018-03-17 DIAGNOSIS — M545 Low back pain: Secondary | ICD-10-CM | POA: Diagnosis not present

## 2018-03-17 DIAGNOSIS — M797 Fibromyalgia: Secondary | ICD-10-CM | POA: Diagnosis not present

## 2018-03-17 DIAGNOSIS — G603 Idiopathic progressive neuropathy: Secondary | ICD-10-CM | POA: Diagnosis not present

## 2018-03-17 DIAGNOSIS — M79606 Pain in leg, unspecified: Secondary | ICD-10-CM | POA: Diagnosis not present

## 2018-03-17 DIAGNOSIS — M25511 Pain in right shoulder: Secondary | ICD-10-CM | POA: Diagnosis not present

## 2018-03-17 DIAGNOSIS — R252 Cramp and spasm: Secondary | ICD-10-CM | POA: Diagnosis not present

## 2018-03-19 DIAGNOSIS — J441 Chronic obstructive pulmonary disease with (acute) exacerbation: Secondary | ICD-10-CM | POA: Diagnosis not present

## 2018-03-19 DIAGNOSIS — R0902 Hypoxemia: Secondary | ICD-10-CM | POA: Diagnosis not present

## 2018-03-19 DIAGNOSIS — J449 Chronic obstructive pulmonary disease, unspecified: Secondary | ICD-10-CM | POA: Diagnosis not present

## 2018-03-24 ENCOUNTER — Other Ambulatory Visit: Payer: Self-pay | Admitting: Family Medicine

## 2018-03-24 NOTE — Telephone Encounter (Signed)
RF request for phenergan and clonazepam.   Last OV 01/30/18 No upcoming OV  Last Phenergan RX 05/29/17 # 30 x 3  Last Clonazepam RX 07/22/17 # 90 X 5 RF  Please advise.

## 2018-03-27 NOTE — Telephone Encounter (Signed)
RF request for clonazepam LOV: 01/30/18 Next ov: None Last written: 07/17/17 #90 w/ 5RF  RF request for promethazine Last written: 05/29/17 #30 w/ 3Rf  Please advise. Thanks.

## 2018-03-27 NOTE — Telephone Encounter (Signed)
Pls check online controlled substance prescribing info-->she has a psychiatrist and I want to make sure they're not rx'ing clonazepam, too.  (Does our controlled substance contract have clonazepam on it?)--thx

## 2018-04-02 DIAGNOSIS — F3181 Bipolar II disorder: Secondary | ICD-10-CM | POA: Diagnosis not present

## 2018-04-17 ENCOUNTER — Ambulatory Visit: Payer: Self-pay

## 2018-04-17 NOTE — Telephone Encounter (Signed)
Pt c/o cough and wheezing since Monday. Pt has used her nebulizer 3-4 times today and her inhaler 4 times today. Pt stated that she has been taking Robitussin DM max and pt stated that it is not helping.   Pt c/o mild SOB with walking. Pt denies fever, cold chills or aches. Pt stated that she is continuing to wheeze despite the Albuterol. Pt is coughing up dark green phlegm. Pt has runny nose and chest congestion.  Pt with h/o COPD and wears O2 at night. During call her O2 saturation was 91-92%. Pt stated that she is coughing to the point that she gets "strangled."  Pt refused UCC and no appointments with PCP today. Called PCP office and spoke with Hinton Dyer. Reported to [facility] triage information. Per Hinton Dyer can offer her an appointment at another [facility] office or can make an appointment tomorrow with Dr Anitra Lauth.  Pt was advised she needed to be seen within 4 hours. Pt refused appointment with any other provider except PCP.  Pt informed she needs to be seen within four hours. Pt stated she will wait to see Dr. Anitra Lauth. Care advice given and pt verbalized understanding. Appointment made for tomorrow at 2:45 pm. Routing note to office for provider to review.   Reason for Disposition . Wheezing is present  Answer Assessment - Initial Assessment Questions 1. ONSET: "When did the cough begin?"      Monday 2. SEVERITY: "How bad is the cough today?"      Coughing to the point of getting strangled and cannot catch breath 3. RESPIRATORY DISTRESS: "Describe your breathing."      Wheezing , mild SOB with walking 4. FEVER: "Do you have a fever?" If so, ask: "What is your temperature, how was it measured, and when did it start?"     haven't checked no cold chills or aches 5. SPUTUM: "Describe the color of your sputum" (clear, white, yellow, green)     Dark green 6. HEMOPTYSIS: "Are you coughing up any blood?" If so ask: "How much?" (flecks, streaks, tablespoons, etc.)     no 7. CARDIAC HISTORY: "Do you have any  history of heart disease?" (e.g., heart attack, congestive heart failure)      no 8. LUNG HISTORY: "Do you have any history of lung disease?"  (e.g., pulmonary embolus, asthma, emphysema) COPD On oxygen at night 2 LPM-SpO2 during call 92% rechecked SpO2 during call: 91% 9. PE RISK FACTORS: "Do you have a history of blood clots?" (or: recent major surgery, recent prolonged travel, bedridden)     no 10. OTHER SYMPTOMS: "Do you have any other symptoms?" (e.g., runny nose, wheezing, chest pain)      Wheezing, cough, runny nose, chest congestion 11. PREGNANCY: "Is there any chance you are pregnant?" "When was your last menstrual period?"       n/a 12. TRAVEL: "Have you traveled out of the country in the last month?" (e.g., travel history, exposures)       no  Protocols used: [place]

## 2018-04-17 NOTE — Telephone Encounter (Signed)
FYI

## 2018-04-17 NOTE — Telephone Encounter (Signed)
Noted  

## 2018-04-18 ENCOUNTER — Encounter: Payer: Self-pay | Admitting: Family Medicine

## 2018-04-18 ENCOUNTER — Ambulatory Visit (INDEPENDENT_AMBULATORY_CARE_PROVIDER_SITE_OTHER): Payer: Commercial Managed Care - PPO | Admitting: Family Medicine

## 2018-04-18 VITALS — BP 134/87 | HR 101 | Temp 98.1°F | Resp 16 | Ht 62.0 in | Wt 268.2 lb

## 2018-04-18 DIAGNOSIS — J0101 Acute recurrent maxillary sinusitis: Secondary | ICD-10-CM

## 2018-04-18 DIAGNOSIS — Z720 Tobacco use: Secondary | ICD-10-CM

## 2018-04-18 DIAGNOSIS — J441 Chronic obstructive pulmonary disease with (acute) exacerbation: Secondary | ICD-10-CM

## 2018-04-18 MED ORDER — PREDNISONE 10 MG PO TABS: ORAL_TABLET | ORAL | 0 refills | Status: DC

## 2018-04-18 MED ORDER — IPRATROPIUM-ALBUTEROL 0.5-2.5 (3) MG/3ML IN SOLN
3.0000 mL | Freq: Once | RESPIRATORY_TRACT | Status: AC
  Administered 2018-04-18: 3 mL via RESPIRATORY_TRACT

## 2018-04-18 MED ORDER — METHYLPREDNISOLONE ACETATE 80 MG/ML IJ SUSP
80.0000 mg | Freq: Once | INTRAMUSCULAR | Status: AC
  Administered 2018-04-18: 80 mg via INTRAMUSCULAR

## 2018-04-18 MED ORDER — TIOTROPIUM BROMIDE MONOHYDRATE 18 MCG IN CAPS: 18.0000 ug | ORAL_CAPSULE | Freq: Every day | RESPIRATORY_TRACT | 3 refills | Status: DC

## 2018-04-18 MED ORDER — AMOXICILLIN-POT CLAVULANATE 875-125 MG PO TABS: 1.0000 | ORAL_TABLET | Freq: Two times a day (BID) | ORAL | 0 refills | Status: DC

## 2018-04-18 NOTE — Addendum Note (Signed)
Addended by: Caroll Rancher L on: 04/18/2018 03:44 PM   Modules accepted: Orders

## 2018-04-18 NOTE — Progress Notes (Signed)
OFFICE VISIT  04/18/2018   CC:  Chief Complaint  Patient presents with  . Cough    wheezing     HPI:    Patient is a 53 y.o. Caucasian female with COPD, obesity hypoventilation syndrome, nocturnal hypoxia, and current tobacco smoking who presents for cough and wheezing. (I last saw her for acute exac COPD on 01/30/2018).  ONset was 5 d/a with ST and cough.  ST resolved, cough worsened, nasal congestion and PND, chest is sore from lots of coughing.  Cough is productive.  No fever or signif HA.  Pain diffusely in upper teeth.  Mucous is thick and sticky.  Has been using 2L oxygen 24/7 last few days to keep sats up and it helps her feel less SOB.  No SOB at rest unless she lies supine.  Takes 10 steps and she begins to feel SOB.  Using albut neb 3-4 times per day. Using albut HFA only when out of home.   Robitussin DM no help.  She continues to smoke cigarettes. Most recent neb of albut was 2 hrs ago.  Past Medical History:  Diagnosis Date  . Anxiety   . Arthritis   . Bilateral leg pain 2018/19   ? venous reflux.  . Bipolar 1 disorder ([place])   . Bronchitis, mucopurulent recurrent ([place])    PFTs 04/2016 showed no sign of copd or asthma, plus she actually had worse FEV1 after albuterol.  . Chronic pain syndrome    chronic low back pain (Pain mgmt= Dr. [name]).  MRI showed no signif disc dz, only showed some facet arthropathy at L4-5, with joint diastasis at L4-5--plan as of 11/07/16 neurosurg eval is bilat facet injections.  . Colitis due to Clostridium difficile 2001  . Cyst of left kidney 2016   Initially noted at 3 cm size, simple appearing, on CT abd/pelv in 2016.  On MRI L spine w and w/out contrast it was larger, lobulated--->ultrasound was done for f/u and it was 4.1 cm w/out worrisome features.  Marland Kitchen GERD (gastroesophageal reflux disease)   . Hepatic hemangioma   . Hepatic steatosis 06/2016   Noted incidentally on CT chest  . History of Salmonella gastroenteritis   .  Hyperlipidemia    a. Noted 02/2014.  Marland Kitchen Hypertension    HCTZ started 07/2015  . IBS (irritable bowel syndrome)   . Iron deficiency anemia 05/2014   per pt it is not due to vaginal blood loss; hemoccults sent to pt in mail 413/16.  . Lumbar spondylosis    mild, mainly focused at L4-5 and L5-S1.  MRI L spine 09/09/17-->mild degenerative disc disease and facet arthrosis without any nerve root encroachment, no spinal stenosis.  . Microscopic hematuria    Intermittent (no w/u done yet, as of 03/2014)  . Migraine headache   . Morbid obesity ([place])   . Multiple lipomas 09/2016   left upper arm--very small ones.  . Nocturnal hypoxia 09/18/2017   Overnight oximetry -->qualified: ordered 2L oxygen during sleep.  . Obesity hypoventilation syndrome ([place])   . Pain in both lower legs 2018-2019   pain and numbness bilat LLs.  NCS/EMGs findings suggestive of S1 radiculopathy bilat: sx's + NCS findings suggestive of neurogenic claudication/spinal stenosis.  . Peripheral edema Fall 2018   R>L, non-pitting for the most part--venous doppler neg for DVT 01/2017.  Saw Dr. Bridgett Larsson (Vasc) 04/12/17 and u/s showed some venous reflux dz but he felt her sx's in legs were NOT due to her venous insufficiency OR PAD.  Thigh high comp stockings rx'd.  . Polycystic ovarian syndrome   . Pruritic dermatitis 2015/2016   +Sc[facility]ies prep at [facility] 07/2014. (Primarily pruritic skin, but eventually a subtle rash as well)  . Raynaud's phenomenon 2019  . Rectal cancer ([place])    a. Followed by Dr. Gala Romney, dx 2000-2001. Tumor removed from rectal.  . Recurrent sinusitis    +allergic rhinitis  . Right shoulder pain 2017   Murphy/Wainer: RC bursitis + RC tear (MRI)--injection trial is plan per pt report 05/04/16.  . Tobacco dependence   . Vitamin D deficiency     Past Surgical History:  Procedure Laterality Date  . ABI's complete Bilateral 09/30/2017   Normal ABIs.  Great toe pressures adequate for wound healing.  Femoral  artery waveforms triphasic (normal).  . CARDIOVASCULAR STRESS TEST  03/24/14   Lexscan MIBI: mild anterior ischemia?  Cardiac CT angiogram recommended/done.  . CHOLECYSTECTOMY    . COLONOSCOPY  05/05/2007   adenoma  . coronary CT angio  03/29/14   Two vessel dz/moderate stenosis of mid LAD and proximal RCA; cath recommended.  Marland Kitchen DILATION AND CURETTAGE OF UTERUS     for vaginal bleeding  . JOINT REPLACEMENT    . LEFT HEART CATHETERIZATION WITH CORONARY ANGIOGRAM N/A 03/31/2014   Normal coronaries, EF 84-13%, diastolic dysfunction.  Procedure: LEFT HEART CATHETERIZATION WITH CORONARY ANGIOGRAM;  Surgeon: Sinclair Grooms, MD;  Location: [facility] C[facility] LAB;  Service: Cardiovascular;  Laterality: N/A;  . LOWER EXT VENOUS DOPPLERS Bilateral 02/08/2017   NEG for DVT (bilat)  . NCS/EMG  10/03/2017   S1 radiculopathy bilat  . Overnight oximetry testing  09/2017   qualified for oxygen supplementation during sleep  . PFTs  04/2016   No sign of COPD or asthma; FEV 1 worse after albut.  . Situ removed  age 54   High-grade rectal adenoma removed from rectum   . TRANSTHORACIC ECHOCARDIOGRAM  03/23/14; 10/21/17   2015 Normal.  2019: mild LVH, EF 65-70%, study technically inadequate to assess diastolic function, valves were fine, wall motion normal.    Outpatient Medications Prior to Visit  Medication Sig Dispense Refill  . albuterol (PROVENTIL HFA) 108 (90 Base) MCG/ACT inhaler Inhale 1-2 puffs into the lungs every 4 (four) hours as needed. 1 Inhaler 3  . albuterol (PROVENTIL) (2.5 MG/3ML) 0.083% nebulizer solution Take 3 mLs (2.5 mg total) by nebulization every 6 (six) hours as needed for wheezing or shortness of breath. 75 mL 6  . AMITIZA 24 MCG capsule Take 24 mcg by mouth 2 (two) times daily.  1  . aspirin 81 MG chewable tablet Chew 1 tablet (81 mg total) by mouth daily. 14 tablet 0  . azelastine (ASTELIN) 0.1 % nasal spray Place 2 sprays into both nostrils 2 (two) times daily. 30 mL 6  . busPIRone (BUSPAR)  15 MG tablet 1 in the morning and 2 at bedtime    . cetirizine (ZYRTEC) 10 MG tablet TAKE 1 TABLET BY MOUTH ONCE DAILY 30 tablet 11  . clonazePAM (KLONOPIN) 0.5 MG tablet TAKE 1 TABLET BY MOUTH DURING THE DAY AS NEEDED AND 2 AT BEDTIME 90 tablet 5  . cyclobenzaprine (FLEXERIL) 10 MG tablet TAKE ONE TABLET BY MOUTH EVERY 6 HOURS AS NEEDED (SPASM RELATED TO HEADACHES) (Patient taking differently: Take 10 mg by mouth 3 (three) times daily. TAKE ONE TABLET BY MOUTH EVERY 6 HOURS AS NEEDED (SPASM RELATED TO HEADACHES)) 42 tablet 5  . diphenhydrAMINE (BENADRYL) 25 MG tablet Take  1 tablet (25 mg total) by mouth every 6 (six) hours. (Patient taking differently: Take 25-50 mg by mouth every 6 (six) hours as needed for itching or allergies. ) 20 tablet 0  . fluticasone (FLONASE) 50 MCG/ACT nasal spray Place 2 sprays into both nostrils daily. 16 g 1  . hydrOXYzine (ATARAX/VISTARIL) 25 MG tablet Take 25 mg by mouth 3 (three) times daily.     Marland Kitchen IRON-FOLIC ACID PO Take 65 mg by mouth daily.     Marland Kitchen lamoTRIgine (LAMICTAL) 100 MG tablet Take 200 mg by mouth at bedtime.    Marland Kitchen losartan (COZAAR) 50 MG tablet TAKE 1 TABLET BY MOUTH ONCE DAILY **REPLACES  TELMISARTAN** 90 tablet 1  . LYRICA 150 MG capsule Take 1 tablet by mouth 3 (three) times daily.  2  . Magnesium-Potassium 40-40 MG CAPS Take 1 capsule by mouth daily.    . meloxicam (MOBIC) 15 MG tablet Take 15 mg by mouth daily.  1  . metoprolol tartrate (LOPRESSOR) 50 MG tablet Take 1 tablet (50 mg total) by mouth 2 (two) times daily. 180 tablet 3  . Naldemedine Tosylate (SYMPROIC) 0.2 MG TABS Take 1 tablet by mouth daily.    . nitroGLYCERIN (NITROSTAT) 0.4 MG SL tablet Place 1 tablet (0.4 mg total) under the tongue every 5 (five) minutes as needed for chest pain. 20 tablet 0  . oxyCODONE-acetaminophen (PERCOCET) 10-325 MG tablet Take 1 tablet by mouth 3 (three) times daily.     . pantoprazole (PROTONIX) 40 MG tablet TAKE 1 TABLET BY MOUTH ONCE DAILY 90 tablet 1  .  promethazine (PHENERGAN) 25 MG tablet TAKE 1 TABLET BY MOUTH EVERY 6 HOURS AS NEEDED FOR NAUSEA AND VOMITING 30 tablet 3  . rOPINIRole (REQUIP) 0.25 MG tablet TAKE 3 TABLETS BY MOUTH ONCE DAILY AT BEDTIME 90 tablet 6  . LATUDA 40 MG TABS tablet TAKE 1 TABLET BY MOUTH ONCE DAILY IN THE EVENING WITH FOOD (AT LEAST 350 CALORIES)  1  . Oxcarbazepine (TRILEPTAL) 300 MG tablet Take 150 mg by mouth daily.    Marland Kitchen amoxicillin-clavulanate (AUGMENTIN) 875-125 MG tablet Take 1 tablet by mouth 2 (two) times daily. (Patient not taking: Reported on 04/18/2018) 20 tablet 0  . predniSONE (DELTASONE) 10 MG tablet 5 tabs po qd x 4d, then 4 tabs po qd x 4d, then 3 tabs po qd x 4d, then 2 tabs po qd x 4d, then 1 tab po qd x 4d, then stop (Patient not taking: Reported on 04/18/2018) 60 tablet 0   No facility-administered medications prior to visit.     Allergies  Allergen Reactions  . Contrast Media [Iodinated Diagnostic Agents] Anaphylaxis    Needs 13-hour prep  . Shellfish Allergy Anaphylaxis  . Codeine Nausea And Vomiting and Other (See Comments)    Hallucinations, Also sees people that are not there   . Furosemide Rash    Rash, soB.  . Lamictal [Lamotrigine] Rash  . Ivermectin Nausea And Vomiting    N/V and rash  . Betadine [Povidone Iodine] Rash  . Latex Hives  . Lipitor [Atorvastatin] Itching  . Permethrin Itching  . Tape Dermatitis    Paper tape and clear    ROS As per HPI  PE: Blood pressure 134/87, pulse (!) 101, temperature 98.1 F (36.7 C), temperature source Oral, resp. rate 16, height 5\' 2"  (1.575 m), weight 268 lb 4 oz (121.7 kg), last menstrual period 03/01/2018, SpO2 90 %. Gen: Alert, tired-appearing.  Patient is oriented to person, place, time,  and situation. HEENT: eyes without injection, drainage, or swelling.  Ears: EACs clear, TMs with normal light reflex and landmarks.  Nose: Clear rhinorrhea, with some dried, crusty exudate adherent to mildly injected mucosa.  No purulent d/c.   Right paranasal sinus TTP.  No facial swelling.  Throat and mouth without focal lesion.  No pharyngial swelling, erythema, or exudate.   Neck: supple, no LAD.   LUNGS: Bibasilar insp crackles, coarse exp wheezing diffusely, prolonged exp phase (3-4 seconds), diminished aeration diffusely, mildly labored resps, with RR of about 25-30  CV: Regular, mild tachycardia, no m/r/g. EXT: no c/c/e SKIN: no rash     LABS:    Chemistry      Component Value Date/Time   NA 136 05/29/2017 1638   K 4.2 05/29/2017 1638   CL 101 05/29/2017 1638   CO2 22 05/29/2017 1638   BUN 11 05/29/2017 1638   CREATININE 0.71 05/29/2017 1638      Component Value Date/Time   CALCIUM 9.6 05/29/2017 1638   ALKPHOS 71 05/29/2017 1638   AST 21 05/29/2017 1638   ALT 17 05/29/2017 1638   BILITOT 0.9 05/29/2017 1638     Lab Results  Component Value Date   WBC 11.9 (H) 01/30/2018   HGB 14.5 01/30/2018   HCT 42.6 01/30/2018   MCV 88.2 01/30/2018   PLT 368.0 01/30/2018   Lab Results  Component Value Date   HGBA1C 5.9 (H) 03/23/2014    IMPRESSION AND PLAN:  1) URI/acute sinusitis on R, with acute exacerbation of COPD, mild hypoxia. Duoneb in office today--> mild objective improvement.  Oxygen came up to 93%. Depo medrol 80mg  IM today in office.  Prednisone taper 50 x 4, 40 x 4, 30 x 4, 20 x 4, 10 x 4. Continue albut neb q4h prn.  Augmentin 875mg  bid x 10d. Use home oxygen to keep sat >92%. Mucinex DM.  Encouraged her to stop smoking. Rx'd spiriva 1 puff qd --she can start this after she is feeling better.  Signs/symptoms to call or return for were reviewed and pt expressed understanding.  An After Visit Summary was printed and given to the patient.  FOLLOW UP: Return in about 3 days (around 04/21/2018) for f/u COPD exac/sinusitis.  Signed:  Crissie Sickles, MD           04/18/2018

## 2018-04-19 DIAGNOSIS — J449 Chronic obstructive pulmonary disease, unspecified: Secondary | ICD-10-CM | POA: Diagnosis not present

## 2018-04-19 DIAGNOSIS — R0902 Hypoxemia: Secondary | ICD-10-CM | POA: Diagnosis not present

## 2018-04-19 DIAGNOSIS — J441 Chronic obstructive pulmonary disease with (acute) exacerbation: Secondary | ICD-10-CM | POA: Diagnosis not present

## 2018-04-21 ENCOUNTER — Ambulatory Visit (INDEPENDENT_AMBULATORY_CARE_PROVIDER_SITE_OTHER): Payer: Commercial Managed Care - PPO | Admitting: Family Medicine

## 2018-04-21 ENCOUNTER — Encounter: Payer: Self-pay | Admitting: Family Medicine

## 2018-04-21 VITALS — BP 141/85 | HR 84 | Temp 97.6°F | Resp 16 | Ht 62.0 in | Wt 268.5 lb

## 2018-04-21 DIAGNOSIS — J441 Chronic obstructive pulmonary disease with (acute) exacerbation: Secondary | ICD-10-CM

## 2018-04-21 DIAGNOSIS — J0191 Acute recurrent sinusitis, unspecified: Secondary | ICD-10-CM

## 2018-04-21 DIAGNOSIS — R0902 Hypoxemia: Secondary | ICD-10-CM | POA: Diagnosis not present

## 2018-04-21 NOTE — Progress Notes (Signed)
OFFICE VISIT  04/21/2018   CC:  Chief Complaint  Patient presents with  . Follow-up    COPD esacerbation/sinusitis   HPI:    Patient is a 53 y.o. Caucasian female with COPD, obesity hypoventilation syndrome, nocturnal hypoxia, and current tobacco smoking who presents for 3 day f/u acute exacerbation of COPD + acute sinusitis.  Her husband accompanies her today. I started her on prednisone taper and augmentin after giving duoneb and depo medrol 80mg  IM at last o/v 3 d/a. I also rx'd her spiriva last visit, with hopes of getting her on this daily when she is getting better from this exacerbation.  Interim hx: She is improved some.  Her cough is still bad but the phlegm is not green anymore. She has less wheezing and less SOB. Albut neb helps her break up mucous when she coughs. Oxygen staying up when staying still.  Drops to 88 with ambulation. Compliant with meds, even picked up spiriva.  No fevers. Nasal passages still clogged.  Using flonase.    ROS: +fatigue.  No CP, no dizziness, no HAs, no rashes, no melena/hematochezia.  No polyuria or polydipsia.  No myalgias or arthralgias.   Past Medical History:  Diagnosis Date  . Anxiety   . Arthritis   . Bilateral leg pain 2018/19   ? venous reflux.  . Bipolar 1 disorder ([place])   . Bronchitis, mucopurulent recurrent ([place])    PFTs 04/2016 showed no sign of copd or asthma, plus she actually had worse FEV1 after albuterol.  . Chronic pain syndrome    chronic low back pain (Pain mgmt= Dr. [name]).  MRI showed no signif disc dz, only showed some facet arthropathy at L4-5, with joint diastasis at L4-5--plan as of 11/07/16 neurosurg eval is bilat facet injections.  . Colitis due to Clostridium difficile 2001  . Cyst of left kidney 2016   Initially noted at 3 cm size, simple appearing, on CT abd/pelv in 2016.  On MRI L spine w and w/out contrast it was larger, lobulated--->ultrasound was done for f/u and it was 4.1 cm w/out worrisome features.   Marland Kitchen GERD (gastroesophageal reflux disease)   . Hepatic hemangioma   . Hepatic steatosis 06/2016   Noted incidentally on CT chest  . History of Salmonella gastroenteritis   . Hyperlipidemia    a. Noted 02/2014.  Marland Kitchen Hypertension    HCTZ started 07/2015  . IBS (irritable bowel syndrome)   . Iron deficiency anemia 05/2014   per pt it is not due to vaginal blood loss; hemoccults sent to pt in mail 413/16.  . Lumbar spondylosis    mild, mainly focused at L4-5 and L5-S1.  MRI L spine 09/09/17-->mild degenerative disc disease and facet arthrosis without any nerve root encroachment, no spinal stenosis.  . Microscopic hematuria    Intermittent (no w/u done yet, as of 03/2014)  . Migraine headache   . Morbid obesity ([place])   . Multiple lipomas 09/2016   left upper arm--very small ones.  . Nocturnal hypoxia 09/18/2017   Overnight oximetry -->qualified: ordered 2L oxygen during sleep.  . Obesity hypoventilation syndrome ([place])   . Pain in both lower legs 2018-2019   pain and numbness bilat LLs.  NCS/EMGs findings suggestive of S1 radiculopathy bilat: sx's + NCS findings suggestive of neurogenic claudication/spinal stenosis.  . Peripheral edema Fall 2018   R>L, non-pitting for the most part--venous doppler neg for DVT 01/2017.  Saw Dr. Bridgett Larsson (Vasc) 04/12/17 and u/s showed some venous reflux dz but  he felt her sx's in legs were NOT due to her venous insufficiency OR PAD.  Thigh high comp stockings rx'd.  . Polycystic ovarian syndrome   . Pruritic dermatitis 2015/2016   +Scabies prep at [facility] 07/2014. (Primarily pruritic skin, but eventually a subtle rash as well)  . Raynaud's phenomenon 2019  . Rectal cancer ([place])    a. Followed by Dr. Gala Romney, dx 2000-2001. Tumor removed from rectal.  . Recurrent sinusitis    +allergic rhinitis  . Right shoulder pain 2017   Murphy/Wainer: RC bursitis + RC tear (MRI)--injection trial is plan per pt report 05/04/16.  . Tobacco dependence   . Vitamin D  deficiency     Past Surgical History:  Procedure Laterality Date  . ABI's complete Bilateral 09/30/2017   Normal ABIs.  Great toe pressures adequate for wound healing.  Femoral artery waveforms triphasic (normal).  . CARDIOVASCULAR STRESS TEST  03/24/14   Lexscan MIBI: mild anterior ischemia?  Cardiac CT angiogram recommended/done.  . CHOLECYSTECTOMY    . COLONOSCOPY  05/05/2007   adenoma  . coronary CT angio  03/29/14   Two vessel dz/moderate stenosis of mid LAD and proximal RCA; cath recommended.  Marland Kitchen DILATION AND CURETTAGE OF UTERUS     for vaginal bleeding  . JOINT REPLACEMENT    . LEFT HEART CATHETERIZATION WITH CORONARY ANGIOGRAM N/A 03/31/2014   Normal coronaries, EF 60-73%, diastolic dysfunction.  Procedure: LEFT HEART CATHETERIZATION WITH CORONARY ANGIOGRAM;  Surgeon: Sinclair Grooms, MD;  Location: [facility] C[facility] LAB;  Service: Cardiovascular;  Laterality: N/A;  . LOWER EXT VENOUS DOPPLERS Bilateral 02/08/2017   NEG for DVT (bilat)  . NCS/EMG  10/03/2017   S1 radiculopathy bilat  . Overnight oximetry testing  09/2017   qualified for oxygen supplementation during sleep  . PFTs  04/2016   No sign of COPD or asthma; FEV 1 worse after albut.  . Situ removed  age 7   High-grade rectal adenoma removed from rectum   . TRANSTHORACIC ECHOCARDIOGRAM  03/23/14; 10/21/17   2015 Normal.  2019: mild LVH, EF 65-70%, study technically inadequate to assess diastolic function, valves were fine, wall motion normal.    Outpatient Medications Prior to Visit  Medication Sig Dispense Refill  . albuterol (PROVENTIL HFA) 108 (90 Base) MCG/ACT inhaler Inhale 1-2 puffs into the lungs every 4 (four) hours as needed. 1 Inhaler 3  . albuterol (PROVENTIL) (2.5 MG/3ML) 0.083% nebulizer solution Take 3 mLs (2.5 mg total) by nebulization every 6 (six) hours as needed for wheezing or shortness of breath. 75 mL 6  . AMITIZA 24 MCG capsule Take 24 mcg by mouth 2 (two) times daily.  1  . amoxicillin-clavulanate  (AUGMENTIN) 875-125 MG tablet Take 1 tablet by mouth 2 (two) times daily. 20 tablet 0  . aspirin 81 MG chewable tablet Chew 1 tablet (81 mg total) by mouth daily. 14 tablet 0  . azelastine (ASTELIN) 0.1 % nasal spray Place 2 sprays into both nostrils 2 (two) times daily. 30 mL 6  . busPIRone (BUSPAR) 15 MG tablet 1 in the morning and 2 at bedtime    . cetirizine (ZYRTEC) 10 MG tablet TAKE 1 TABLET BY MOUTH ONCE DAILY 30 tablet 11  . clonazePAM (KLONOPIN) 0.5 MG tablet TAKE 1 TABLET BY MOUTH DURING THE DAY AS NEEDED AND 2 AT BEDTIME 90 tablet 5  . cyclobenzaprine (FLEXERIL) 10 MG tablet TAKE ONE TABLET BY MOUTH EVERY 6 HOURS AS NEEDED (SPASM RELATED TO HEADACHES) (Patient taking  differently: Take 10 mg by mouth 3 (three) times daily. TAKE ONE TABLET BY MOUTH EVERY 6 HOURS AS NEEDED (SPASM RELATED TO HEADACHES)) 42 tablet 5  . diphenhydrAMINE (BENADRYL) 25 MG tablet Take 1 tablet (25 mg total) by mouth every 6 (six) hours. (Patient taking differently: Take 25-50 mg by mouth every 6 (six) hours as needed for itching or allergies. ) 20 tablet 0  . fluticasone (FLONASE) 50 MCG/ACT nasal spray Place 2 sprays into both nostrils daily. 16 g 1  . hydrOXYzine (ATARAX/VISTARIL) 25 MG tablet Take 25 mg by mouth 3 (three) times daily.     Marland Kitchen IRON-FOLIC ACID PO Take 65 mg by mouth daily.     Marland Kitchen lamoTRIgine (LAMICTAL) 100 MG tablet Take 200 mg by mouth at bedtime.    Marland Kitchen LATUDA 40 MG TABS tablet TAKE 1 TABLET BY MOUTH ONCE DAILY IN THE EVENING WITH FOOD (AT LEAST 350 CALORIES)  1  . losartan (COZAAR) 50 MG tablet TAKE 1 TABLET BY MOUTH ONCE DAILY **REPLACES  TELMISARTAN** 90 tablet 1  . LYRICA 150 MG capsule Take 1 tablet by mouth 3 (three) times daily.  2  . Magnesium-Potassium 40-40 MG CAPS Take 1 capsule by mouth daily.    . meloxicam (MOBIC) 15 MG tablet Take 15 mg by mouth daily.  1  . metoprolol tartrate (LOPRESSOR) 50 MG tablet Take 1 tablet (50 mg total) by mouth 2 (two) times daily. 180 tablet 3  .  Naldemedine Tosylate (SYMPROIC) 0.2 MG TABS Take 1 tablet by mouth daily.    . nitroGLYCERIN (NITROSTAT) 0.4 MG SL tablet Place 1 tablet (0.4 mg total) under the tongue every 5 (five) minutes as needed for chest pain. 20 tablet 0  . Oxcarbazepine (TRILEPTAL) 300 MG tablet Take 150 mg by mouth daily.    Marland Kitchen oxyCODONE-acetaminophen (PERCOCET) 10-325 MG tablet Take 1 tablet by mouth 3 (three) times daily.     . pantoprazole (PROTONIX) 40 MG tablet TAKE 1 TABLET BY MOUTH ONCE DAILY 90 tablet 1  . predniSONE (DELTASONE) 10 MG tablet 5 tabs po qd x 4d, then 4 tabs po qd x 4d, then 3 tabs po qd x 4d, then 2 tabs po qd x 4d, then 1 tab po qd x 4d, then stop 60 tablet 0  . promethazine (PHENERGAN) 25 MG tablet TAKE 1 TABLET BY MOUTH EVERY 6 HOURS AS NEEDED FOR NAUSEA AND VOMITING 30 tablet 3  . rOPINIRole (REQUIP) 0.25 MG tablet TAKE 3 TABLETS BY MOUTH ONCE DAILY AT BEDTIME 90 tablet 6  . tiotropium (SPIRIVA HANDIHALER) 18 MCG inhalation capsule Place 1 capsule (18 mcg total) into inhaler and inhale daily. 30 capsule 3   No facility-administered medications prior to visit.     Allergies  Allergen Reactions  . Contrast Media [Iodinated Diagnostic Agents] Anaphylaxis    Needs 13-hour prep  . Shellfish Allergy Anaphylaxis  . Codeine Nausea And Vomiting and Other (See Comments)    Hallucinations, Also sees people that are not there   . Furosemide Rash    Rash, soB.  . Lamictal [Lamotrigine] Rash  . Ivermectin Nausea And Vomiting    N/V and rash  . Betadine [Povidone Iodine] Rash  . Latex Hives  . Lipitor [Atorvastatin] Itching  . Permethrin Itching  . Tape Dermatitis    Paper tape and clear   ROS As per HPI  PE: Blood pressure (!) 141/85, pulse 84, temperature 97.6 F (36.4 C), temperature source Oral, resp. rate 16, height 5\' 2"  (1.575  m), weight 268 lb 8 oz (121.8 kg), SpO2 94 %. VS: noted--normal. Gen: alert, NAD, NONTOXIC APPEARING. HEENT: eyes without injection, drainage, or swelling.   Ears: EACs clear, TMs with normal light reflex and landmarks.  Nose: Clear rhinorrhea, with some dried, crusty exudate adherent to mildly injected mucosa.  No purulent d/c.  Mild diffuse paranasal sinus TTP.  Minimal paranasal facial swelling.  Throat and mouth without focal lesion.  No pharyngial swelling, erythema, or exudate.   Neck: supple, no LAD.   LUNGS: CTA bilat on inspiration, decent aeration on exhalation, soft end exp wheeze and mildly prolonged exp phase, nonlabored resps.   CV: RRR, no m/r/g. EXT: no c/c/e SKIN: no rash  LABS:    Chemistry      Component Value Date/Time   NA 136 05/29/2017 1638   K 4.2 05/29/2017 1638   CL 101 05/29/2017 1638   CO2 22 05/29/2017 1638   BUN 11 05/29/2017 1638   CREATININE 0.71 05/29/2017 1638      Component Value Date/Time   CALCIUM 9.6 05/29/2017 1638   ALKPHOS 71 05/29/2017 1638   AST 21 05/29/2017 1638   ALT 17 05/29/2017 1638   BILITOT 0.9 05/29/2017 1638     Lab Results  Component Value Date   WBC 11.9 (H) 01/30/2018   HGB 14.5 01/30/2018   HCT 42.6 01/30/2018   MCV 88.2 01/30/2018   PLT 368.0 01/30/2018     IMPRESSION AND PLAN:  Acute exacerbation of COPD, with acute sinusitis. Improving appropriately.  Plan is to continue steroid taper, finish augmentin, continue to use albut neb q4h prn, continue to supplement with 2L oxygen via [place] to keep sat >92%. Add saline nasal spray.   I showed her a 3 minute video on my phone that explained exactly how to use the spiriva handihaler, which I want her to start today.  An After Visit Summary was printed and given to the patient.  FOLLOW UP: Return in about 1 week (around 04/28/2018) for f/u sinusitis and copd.  Signed:  Crissie Sickles, MD           04/21/2018

## 2018-04-25 ENCOUNTER — Other Ambulatory Visit: Payer: Self-pay | Admitting: Family Medicine

## 2018-04-28 ENCOUNTER — Ambulatory Visit: Payer: Commercial Managed Care - PPO | Admitting: Family Medicine

## 2018-04-28 NOTE — Telephone Encounter (Signed)
RF request for ropinirole LOV: 04/21/18 Next ov: 04/28/18 Last written: 09/20/17 #90 w/ 6RF  Please advise. Thanks.

## 2018-05-01 ENCOUNTER — Other Ambulatory Visit: Payer: Self-pay | Admitting: Family Medicine

## 2018-05-01 ENCOUNTER — Ambulatory Visit (HOSPITAL_COMMUNITY)
Admission: RE | Admit: 2018-05-01 | Discharge: 2018-05-01 | Disposition: A | Discharge status: Home / Self Care | Payer: Commercial Managed Care - PPO | Source: Ambulatory Visit | Attending: Family Medicine | Admitting: Family Medicine

## 2018-05-01 ENCOUNTER — Encounter: Payer: Self-pay | Admitting: Family Medicine

## 2018-05-01 ENCOUNTER — Ambulatory Visit (INDEPENDENT_AMBULATORY_CARE_PROVIDER_SITE_OTHER): Payer: Commercial Managed Care - PPO | Admitting: Family Medicine

## 2018-05-01 VITALS — BP 139/80 | HR 102 | Temp 97.9°F | Resp 18 | Ht 62.0 in | Wt 268.0 lb

## 2018-05-01 DIAGNOSIS — R0609 Other forms of dyspnea: Secondary | ICD-10-CM

## 2018-05-01 DIAGNOSIS — J441 Chronic obstructive pulmonary disease with (acute) exacerbation: Secondary | ICD-10-CM

## 2018-05-01 DIAGNOSIS — R0600 Dyspnea, unspecified: Secondary | ICD-10-CM

## 2018-05-01 MED ORDER — LEVOFLOXACIN 500 MG PO TABS: 500.0000 mg | ORAL_TABLET | Freq: Every day | ORAL | 0 refills | Status: DC

## 2018-05-01 NOTE — Progress Notes (Signed)
OFFICE VISIT  05/01/2018   CC:  Chief Complaint  Patient presents with  . Follow-up    Sinusitis and COPD     HPI:    Patient is a 53 y.o. Caucasian female who presents for 10 day f/u acute exacerbation of COPD. About 2 wks ago I started her on a 20d prednisone taper.  Also recently started her on spiriva handihaler. She started spiriva but says it is giving her bad HA's. Taking prednisone as rx'd, albut 2-3 times a day.  Not much use of albuterol, doesn't seem to help much. SOB with ambulating to bathroom.  R scapular pain, but no chest pain.  No fevers.  No oxygen use in daytime at home. Still stuffy in nose, but nothing coming out of nose. Cough is productive of phlegm-->yellow and usually thin, occ globules.   Not taking any robitussin or mucinex. Pt unsure if LE edema any worse than her baseline.  ROS: No dizziness, no rashes, no melena/hematochezia.  No polyuria or polydipsia.  No myalgias or arthralgias.  +Fatigue.      Past Medical History:  Diagnosis Date  . Anxiety   . Arthritis   . Bilateral leg pain 2018/19   ? venous reflux.  . Bipolar 1 disorder ([place])   . Bronchitis, mucopurulent recurrent ([place])    PFTs 04/2016 showed no sign of copd or asthma, plus she actually had worse FEV1 after albuterol.  . Chronic pain syndrome    chronic low back pain (Pain mgmt= Dr. [name]).  MRI showed no signif disc dz, only showed some facet arthropathy at L4-5, with joint diastasis at L4-5--plan as of 11/07/16 neurosurg eval is bilat facet injections.  . Colitis due to Clostridium difficile 2001  . Cyst of left kidney 2016   Initially noted at 3 cm size, simple appearing, on CT abd/pelv in 2016.  On MRI L spine w and w/out contrast it was larger, lobulated--->ultrasound was done for f/u and it was 4.1 cm w/out worrisome features.  Marland Kitchen GERD (gastroesophageal reflux disease)   . Hepatic hemangioma   . Hepatic steatosis 06/2016   Noted incidentally on CT chest  . History of  Salmonella gastroenteritis   . Hyperlipidemia    a. Noted 02/2014.  Marland Kitchen Hypertension    HCTZ started 07/2015  . IBS (irritable bowel syndrome)   . Iron deficiency anemia 05/2014   per pt it is not due to vaginal blood loss; hemoccults sent to pt in mail 413/16.  . Lumbar spondylosis    mild, mainly focused at L4-5 and L5-S1.  MRI L spine 09/09/17-->mild degenerative disc disease and facet arthrosis without any nerve root encroachment, no spinal stenosis.  . Microscopic hematuria    Intermittent (no w/u done yet, as of 03/2014)  . Migraine headache   . Morbid obesity ([place])   . Multiple lipomas 09/2016   left upper arm--very small ones.  . Nocturnal hypoxia 09/18/2017   Overnight oximetry -->qualified: ordered 2L oxygen during sleep.  . Obesity hypoventilation syndrome ([place])   . Pain in both lower legs 2018-2019   pain and numbness bilat LLs.  NCS/EMGs findings suggestive of S1 radiculopathy bilat: sx's + NCS findings suggestive of neurogenic claudication/spinal stenosis.  . Peripheral edema Fall 2018   R>L, non-pitting for the most part--venous doppler neg for DVT 01/2017.  Saw Dr. Bridgett Larsson (Vasc) 04/12/17 and u/s showed some venous reflux dz but he felt her sx's in legs were NOT due to her venous insufficiency OR PAD.  Thigh  high comp stockings rx'd.  . Polycystic ovarian syndrome   . Pruritic dermatitis 2015/2016   +Scabies prep at [facility] 07/2014. (Primarily pruritic skin, but eventually a subtle rash as well)  . Raynaud's phenomenon 2019  . Rectal cancer ([place])    a. Followed by Dr. Gala Romney, dx 2000-2001. Tumor removed from rectal.  . Recurrent sinusitis    +allergic rhinitis  . Right shoulder pain 2017   Murphy/Wainer: RC bursitis + RC tear (MRI)--injection trial is plan per pt report 05/04/16.  . Tobacco dependence   . Vitamin D deficiency     Past Surgical History:  Procedure Laterality Date  . ABI's complete Bilateral 09/30/2017   Normal ABIs.  Great toe pressures adequate  for wound healing.  Femoral artery waveforms triphasic (normal).  . CARDIOVASCULAR STRESS TEST  03/24/14   Lexscan MIBI: mild anterior ischemia?  Cardiac CT angiogram recommended/done.  . CHOLECYSTECTOMY    . COLONOSCOPY  05/05/2007   adenoma  . coronary CT angio  03/29/14   Two vessel dz/moderate stenosis of mid LAD and proximal RCA; cath recommended.  Marland Kitchen DILATION AND CURETTAGE OF UTERUS     for vaginal bleeding  . JOINT REPLACEMENT    . LEFT HEART CATHETERIZATION WITH CORONARY ANGIOGRAM N/A 03/31/2014   Normal coronaries, EF 19-14%, diastolic dysfunction.  Procedure: LEFT HEART CATHETERIZATION WITH CORONARY ANGIOGRAM;  Surgeon: Sinclair Grooms, MD;  Location: [facility] CATH LAB;  Service: Cardiovascular;  Laterality: N/A;  . LOWER EXT VENOUS DOPPLERS Bilateral 02/08/2017   NEG for DVT (bilat)  . NCS/EMG  10/03/2017   S1 radiculopathy bilat  . Overnight oximetry testing  09/2017   qualified for oxygen supplementation during sleep  . PFTs  04/2016   No sign of COPD or asthma; FEV 1 worse after albut.  . Situ removed  age 74   High-grade rectal adenoma removed from rectum   . TRANSTHORACIC ECHOCARDIOGRAM  03/23/14; 10/21/17   2015 Normal.  2019: mild LVH, EF 65-70%, study technically inadequate to assess diastolic function, valves were fine, wall motion normal.    Outpatient Medications Prior to Visit  Medication Sig Dispense Refill  . albuterol (PROVENTIL HFA) 108 (90 Base) MCG/ACT inhaler Inhale 1-2 puffs into the lungs every 4 (four) hours as needed. 1 Inhaler 3  . albuterol (PROVENTIL) (2.5 MG/3ML) 0.083% nebulizer solution Take 3 mLs (2.5 mg total) by nebulization every 6 (six) hours as needed for wheezing or shortness of breath. 75 mL 6  . AMITIZA 24 MCG capsule Take 24 mcg by mouth 2 (two) times daily.  1  . aspirin 81 MG chewable tablet Chew 1 tablet (81 mg total) by mouth daily. 14 tablet 0  . azelastine (ASTELIN) 0.1 % nasal spray Place 2 sprays into both nostrils 2 (two) times daily. 30  mL 6  . busPIRone (BUSPAR) 15 MG tablet 1 in the morning and 2 at bedtime    . cetirizine (ZYRTEC) 10 MG tablet TAKE 1 TABLET BY MOUTH ONCE DAILY 30 tablet 11  . clonazePAM (KLONOPIN) 0.5 MG tablet TAKE 1 TABLET BY MOUTH DURING THE DAY AS NEEDED AND 2 AT BEDTIME 90 tablet 5  . cyclobenzaprine (FLEXERIL) 10 MG tablet TAKE ONE TABLET BY MOUTH EVERY 6 HOURS AS NEEDED (SPASM RELATED TO HEADACHES) (Patient taking differently: Take 10 mg by mouth 3 (three) times daily. TAKE ONE TABLET BY MOUTH EVERY 6 HOURS AS NEEDED (SPASM RELATED TO HEADACHES)) 42 tablet 5  . diphenhydrAMINE (BENADRYL) 25 MG tablet Take 1  tablet (25 mg total) by mouth every 6 (six) hours. (Patient taking differently: Take 25-50 mg by mouth every 6 (six) hours as needed for itching or allergies. ) 20 tablet 0  . fluticasone (FLONASE) 50 MCG/ACT nasal spray Place 2 sprays into both nostrils daily. 16 g 1  . hydrOXYzine (ATARAX/VISTARIL) 25 MG tablet Take 25 mg by mouth 3 (three) times daily.     Marland Kitchen IRON-FOLIC ACID PO Take 65 mg by mouth daily.     Marland Kitchen lamoTRIgine (LAMICTAL) 100 MG tablet Take 200 mg by mouth at bedtime.    Marland Kitchen LATUDA 40 MG TABS tablet TAKE 1 TABLET BY MOUTH ONCE DAILY IN THE EVENING WITH FOOD (AT LEAST 350 CALORIES)  1  . losartan (COZAAR) 50 MG tablet TAKE 1 TABLET BY MOUTH ONCE DAILY **REPLACES  TELMISARTAN** 90 tablet 1  . LYRICA 150 MG capsule Take 1 tablet by mouth 3 (three) times daily.  2  . Magnesium-Potassium 40-40 MG CAPS Take 1 capsule by mouth daily.    . meloxicam (MOBIC) 15 MG tablet Take 15 mg by mouth daily.  1  . metoprolol tartrate (LOPRESSOR) 50 MG tablet Take 1 tablet (50 mg total) by mouth 2 (two) times daily. 180 tablet 3  . Naldemedine Tosylate (SYMPROIC) 0.2 MG TABS Take 1 tablet by mouth daily.    . nitroGLYCERIN (NITROSTAT) 0.4 MG SL tablet Place 1 tablet (0.4 mg total) under the tongue every 5 (five) minutes as needed for chest pain. 20 tablet 0  . Oxcarbazepine (TRILEPTAL) 300 MG tablet Take 150 mg  by mouth daily.    Marland Kitchen oxyCODONE-acetaminophen (PERCOCET) 10-325 MG tablet Take 1 tablet by mouth 3 (three) times daily.     . pantoprazole (PROTONIX) 40 MG tablet TAKE 1 TABLET BY MOUTH ONCE DAILY 90 tablet 1  . predniSONE (DELTASONE) 10 MG tablet 5 tabs po qd x 4d, then 4 tabs po qd x 4d, then 3 tabs po qd x 4d, then 2 tabs po qd x 4d, then 1 tab po qd x 4d, then stop 60 tablet 0  . promethazine (PHENERGAN) 25 MG tablet TAKE 1 TABLET BY MOUTH EVERY 6 HOURS AS NEEDED FOR NAUSEA AND VOMITING 30 tablet 3  . rOPINIRole (REQUIP) 0.25 MG tablet TAKE 3 TABLETS BY MOUTH AT BEDTIME 270 tablet 1  . tiotropium (SPIRIVA HANDIHALER) 18 MCG inhalation capsule Place 1 capsule (18 mcg total) into inhaler and inhale daily. 30 capsule 3  . amoxicillin-clavulanate (AUGMENTIN) 875-125 MG tablet Take 1 tablet by mouth 2 (two) times daily. (Patient not taking: Reported on 05/01/2018) 20 tablet 0   No facility-administered medications prior to visit.     Allergies  Allergen Reactions  . Contrast Media [Iodinated Diagnostic Agents] Anaphylaxis    Needs 13-hour prep  . Shellfish Allergy Anaphylaxis  . Codeine Nausea And Vomiting and Other (See Comments)    Hallucinations, Also sees people that are not there   . Furosemide Rash    Rash, soB.  . Lamictal [Lamotrigine] Rash  . Ivermectin Nausea And Vomiting    N/V and rash  . Betadine [Povidone Iodine] Rash  . Latex Hives  . Lipitor [Atorvastatin] Itching  . Permethrin Itching  . Tape Dermatitis    Paper tape and clear    ROS As per HPI  PE: Blood pressure 139/80, pulse (!) 102, temperature 97.9 F (36.6 C), temperature source Oral, resp. rate 18, height 5\' 2"  (1.575 m), weight 268 lb (121.6 kg), SpO2 94 %. Gen: Alert, tired-  appearing but NAD.  Patient is oriented to person, place, time, and situation. VS: noted--normal. Gen: alert, NAD, NONTOXIC APPEARING. HEENT: eyes without injection, drainage, or swelling.  Ears: EACs clear, TMs with normal light  reflex and landmarks.  Nose: Clear rhinorrhea, with some dried, crusty exudate adherent to mildly injected mucosa.  No purulent d/c.  No paranasal sinus TTP.  No facial swelling.  Throat and mouth without focal lesion.  No pharyngial swelling, erythema, or exudate.   Neck: supple, no LAD.   LUNGS: with routine/normal respirations she is CTA bilat, nonlabored resps.  With forced exp maneuver she has trace end-exp wheezing and prolonged exp phase.   CV: RRR, no m/r/g. EXT: no c/c/e SKIN: no rash  LABS:    Chemistry      Component Value Date/Time   NA 136 05/29/2017 1638   K 4.2 05/29/2017 1638   CL 101 05/29/2017 1638   CO2 22 05/29/2017 1638   BUN 11 05/29/2017 1638   CREATININE 0.71 05/29/2017 1638      Component Value Date/Time   CALCIUM 9.6 05/29/2017 1638   ALKPHOS 71 05/29/2017 1638   AST 21 05/29/2017 1638   ALT 17 05/29/2017 1638   BILITOT 0.9 05/29/2017 1638     Lab Results  Component Value Date   WBC 11.9 (H) 01/30/2018   HGB 14.5 01/30/2018   HCT 42.6 01/30/2018   MCV 88.2 01/30/2018   PLT 368.0 01/30/2018    IMPRESSION AND PLAN:  1) Acute COPD exacerbation, not getting much better.  She continues to smoke cigarettes. Plan: Stop spiriva-->severe headaches. Restart robitussin DM. Supplement with oxygen to keep oxygen sat>92%. Cont albut neb q4h prn. Finish steroid taper. Levaquin 500mg  qd x 7d. CXR today--[facility].  An After Visit Summary was printed and given to the patient.  FOLLOW UP: Return in about 1 week (around 05/08/2018) for f/u COPD exacerbation.  Signed:  Crissie Sickles, MD           05/01/2018

## 2018-05-01 NOTE — Patient Instructions (Signed)
Stop spiriva. Continue to finish your prednisone as prescribed. Take the levofloxacin I prescribed today. Restart robitussin DM. Get chest x-ray today at [facility] (ordered).

## 2018-05-04 ENCOUNTER — Other Ambulatory Visit: Payer: Self-pay | Admitting: Family Medicine

## 2018-05-09 ENCOUNTER — Ambulatory Visit (INDEPENDENT_AMBULATORY_CARE_PROVIDER_SITE_OTHER): Payer: Commercial Managed Care - PPO | Admitting: Family Medicine

## 2018-05-09 ENCOUNTER — Encounter: Payer: Self-pay | Admitting: Family Medicine

## 2018-05-09 VITALS — BP 130/88 | HR 93 | Temp 97.9°F | Resp 16 | Ht 62.0 in | Wt 274.0 lb

## 2018-05-09 DIAGNOSIS — E662 Morbid (severe) obesity with alveolar hypoventilation: Secondary | ICD-10-CM

## 2018-05-09 DIAGNOSIS — J4551 Severe persistent asthma with (acute) exacerbation: Secondary | ICD-10-CM | POA: Diagnosis not present

## 2018-05-09 DIAGNOSIS — J329 Chronic sinusitis, unspecified: Secondary | ICD-10-CM

## 2018-05-09 DIAGNOSIS — F17200 Nicotine dependence, unspecified, uncomplicated: Secondary | ICD-10-CM | POA: Diagnosis not present

## 2018-05-09 MED ORDER — FLUTICASONE-SALMETEROL 250-50 MCG/DOSE IN AEPB: 1.0000 | INHALATION_SPRAY | Freq: Two times a day (BID) | RESPIRATORY_TRACT | 6 refills | Status: DC

## 2018-05-09 MED ORDER — PREDNISONE 20 MG PO TABS: ORAL_TABLET | ORAL | 0 refills | Status: DC

## 2018-05-09 NOTE — Progress Notes (Signed)
OFFICE VISIT  05/09/2018   CC:  Chief Complaint  Patient presents with  . Follow-up    COPD exacerbation   HPI:    Patient is a 53 y.o. Caucasian female with chronic/current tobacco dependence who has chronic bronchitis sx's AND obesity hypoventilation syndrome presents accompanied by her husband for 1 wk f/u COPD exacerbation. She was to finish out her steroid taper and also take a 7d course of levaquin after I saw her last visit. CXR was NORMAL at that time.  Interim Hx:  She has finished abx and prednisone. Still the same, lots of coughing and feeling of SOB, + wheezing, +DOE.  Short term relief from albuterol inhaler.  Not using neb machine. Uses Oxygen at night, occ in afternoons. Still smoking.  No fevers. NO chest pain.  Still with signif nasal/sinus congestion.   Eating and drinking fine.  After further questioning, she says she had been getting better until she finished her prednisone a few days ago.    Past Medical History:  Diagnosis Date  . Anxiety   . Arthritis   . Bilateral leg pain 2018/19   ? venous reflux.  . Bipolar 1 disorder ([place])   . Bronchitis, mucopurulent recurrent ([place])    PFTs 04/2016 showed no sign of copd or asthma, plus she actually had worse FEV1 after albuterol.  . Chronic pain syndrome    chronic low back pain (Pain mgmt= Dr. [name]).  MRI showed no signif disc dz, only showed some facet arthropathy at L4-5, with joint diastasis at L4-5--plan as of 11/07/16 neurosurg eval is bilat facet injections.  . Colitis due to Clostridium difficile 2001  . Cyst of left kidney 2016   Initially noted at 3 cm size, simple appearing, on CT abd/pelv in 2016.  On MRI L spine w and w/out contrast it was larger, lobulated--->ultrasound was done for f/u and it was 4.1 cm w/out worrisome features.  Marland Kitchen GERD (gastroesophageal reflux disease)   . Hepatic hemangioma   . Hepatic steatosis 06/2016   Noted incidentally on CT chest  . History of Salmonella  gastroenteritis   . Hyperlipidemia    a. Noted 02/2014.  Marland Kitchen Hypertension    HCTZ started 07/2015  . IBS (irritable bowel syndrome)   . Iron deficiency anemia 05/2014   per pt it is not due to vaginal blood loss; hemoccults sent to pt in mail 413/16.  . Lumbar spondylosis    mild, mainly focused at L4-5 and L5-S1.  MRI L spine 09/09/17-->mild degenerative disc disease and facet arthrosis without any nerve root encroachment, no spinal stenosis.  . Microscopic hematuria    Intermittent (no w/u done yet, as of 03/2014)  . Migraine headache   . Morbid obesity ([place])   . Multiple lipomas 09/2016   left upper arm--very small ones.  . Nocturnal hypoxia 09/18/2017   Overnight oximetry -->qualified: ordered 2L oxygen during sleep.  . Obesity hypoventilation syndrome ([place])   . Pain in both lower legs 2018-2019   pain and numbness bilat LLs.  NCS/EMGs findings suggestive of S1 radiculopathy bilat: sx's + NCS findings suggestive of neurogenic claudication/spinal stenosis.  . Peripheral edema Fall 2018   R>L, non-pitting for the most part--venous doppler neg for DVT 01/2017.  Saw Dr. Bridgett Larsson (Vasc) 04/12/17 and u/s showed some venous reflux dz but he felt her sx's in legs were NOT due to her venous insufficiency OR PAD.  Thigh high comp stockings rx'd.  . Polycystic ovarian syndrome   . Pruritic dermatitis  2015/2016   +Scabies prep at [facility] 07/2014. (Primarily pruritic skin, but eventually a subtle rash as well)  . Raynaud's phenomenon 2019  . Rectal cancer ([place])    a. Followed by Dr. Gala Romney, dx 2000-2001. Tumor removed from rectal.  . Recurrent sinusitis    +allergic rhinitis  . Right shoulder pain 2017   Murphy/Wainer: RC bursitis + RC tear (MRI)--injection trial is plan per pt report 05/04/16.  . Tobacco dependence   . Vitamin D deficiency     Past Surgical History:  Procedure Laterality Date  . ABI's complete Bilateral 09/30/2017   Normal ABIs.  Great toe pressures adequate for wound  healing.  Femoral artery waveforms triphasic (normal).  . CARDIOVASCULAR STRESS TEST  03/24/14   Lexscan MIBI: mild anterior ischemia?  Cardiac CT angiogram recommended/done.  . CHOLECYSTECTOMY    . COLONOSCOPY  05/05/2007   adenoma  . coronary CT angio  03/29/14   Two vessel dz/moderate stenosis of mid LAD and proximal RCA; cath recommended.  Marland Kitchen DILATION AND CURETTAGE OF UTERUS     for vaginal bleeding  . JOINT REPLACEMENT    . LEFT HEART CATHETERIZATION WITH CORONARY ANGIOGRAM N/A 03/31/2014   Normal coronaries, EF 14-43%, diastolic dysfunction.  Procedure: LEFT HEART CATHETERIZATION WITH CORONARY ANGIOGRAM;  Surgeon: Sinclair Grooms, MD;  Location: [facility] CATH LAB;  Service: Cardiovascular;  Laterality: N/A;  . LOWER EXT VENOUS DOPPLERS Bilateral 02/08/2017   NEG for DVT (bilat)  . NCS/EMG  10/03/2017   S1 radiculopathy bilat  . Overnight oximetry testing  09/2017   qualified for oxygen supplementation during sleep  . PFTs  04/2016   No sign of COPD or asthma; FEV 1 worse after albut.  . Situ removed  age 79   High-grade rectal adenoma removed from rectum   . TRANSTHORACIC ECHOCARDIOGRAM  03/23/14; 10/21/17   2015 Normal.  2019: mild LVH, EF 65-70%, study technically inadequate to assess diastolic function, valves were fine, wall motion normal.    Outpatient Medications Prior to Visit  Medication Sig Dispense Refill  . albuterol (PROVENTIL HFA) 108 (90 Base) MCG/ACT inhaler Inhale 1-2 puffs into the lungs every 4 (four) hours as needed. 1 Inhaler 3  . albuterol (PROVENTIL) (2.5 MG/3ML) 0.083% nebulizer solution Take 3 mLs (2.5 mg total) by nebulization every 6 (six) hours as needed for wheezing or shortness of breath. 75 mL 6  . AMITIZA 24 MCG capsule Take 24 mcg by mouth 2 (two) times daily.  1  . aspirin 81 MG chewable tablet Chew 1 tablet (81 mg total) by mouth daily. 14 tablet 0  . azelastine (ASTELIN) 0.1 % nasal spray Place 2 sprays into both nostrils 2 (two) times daily. 30 mL 6  .  busPIRone (BUSPAR) 15 MG tablet 1 in the morning and 2 at bedtime    . cetirizine (ZYRTEC) 10 MG tablet TAKE 1 TABLET BY MOUTH ONCE DAILY 30 tablet 11  . clonazePAM (KLONOPIN) 0.5 MG tablet TAKE 1 TABLET BY MOUTH DURING THE DAY AS NEEDED AND 2 AT BEDTIME 90 tablet 5  . cyclobenzaprine (FLEXERIL) 10 MG tablet TAKE ONE TABLET BY MOUTH EVERY 6 HOURS AS NEEDED (SPASM RELATED TO HEADACHES) (Patient taking differently: Take 10 mg by mouth 3 (three) times daily. TAKE ONE TABLET BY MOUTH EVERY 6 HOURS AS NEEDED (SPASM RELATED TO HEADACHES)) 42 tablet 5  . diphenhydrAMINE (BENADRYL) 25 MG tablet Take 1 tablet (25 mg total) by mouth every 6 (six) hours. (Patient taking differently: Take  25-50 mg by mouth every 6 (six) hours as needed for itching or allergies. ) 20 tablet 0  . fluticasone (FLONASE) 50 MCG/ACT nasal spray Place 2 sprays into both nostrils daily. 16 g 1  . hydrOXYzine (ATARAX/VISTARIL) 25 MG tablet Take 25 mg by mouth 3 (three) times daily.     Marland Kitchen IRON-FOLIC ACID PO Take 65 mg by mouth daily.     Marland Kitchen lamoTRIgine (LAMICTAL) 100 MG tablet Take 200 mg by mouth at bedtime.    Marland Kitchen LATUDA 40 MG TABS tablet TAKE 1 TABLET BY MOUTH ONCE DAILY IN THE EVENING WITH FOOD (AT LEAST 350 CALORIES)  1  . losartan (COZAAR) 50 MG tablet Take 1 tablet (50 mg total) by mouth daily. 90 tablet 1  . LYRICA 150 MG capsule Take 1 tablet by mouth 3 (three) times daily.  2  . Magnesium-Potassium 40-40 MG CAPS Take 1 capsule by mouth daily.    . meloxicam (MOBIC) 15 MG tablet Take 15 mg by mouth daily.  1  . metoprolol tartrate (LOPRESSOR) 50 MG tablet Take 1 tablet (50 mg total) by mouth 2 (two) times daily. 180 tablet 3  . Naldemedine Tosylate (SYMPROIC) 0.2 MG TABS Take 1 tablet by mouth daily.    . nitroGLYCERIN (NITROSTAT) 0.4 MG SL tablet Place 1 tablet (0.4 mg total) under the tongue every 5 (five) minutes as needed for chest pain. 20 tablet 0  . Oxcarbazepine (TRILEPTAL) 300 MG tablet Take 150 mg by mouth daily.    Marland Kitchen  oxyCODONE-acetaminophen (PERCOCET) 10-325 MG tablet Take 1 tablet by mouth 3 (three) times daily.     . pantoprazole (PROTONIX) 40 MG tablet TAKE 1 TABLET BY MOUTH ONCE DAILY 90 tablet 1  . promethazine (PHENERGAN) 25 MG tablet TAKE 1 TABLET BY MOUTH EVERY 6 HOURS AS NEEDED FOR NAUSEA AND VOMITING 30 tablet 3  . rOPINIRole (REQUIP) 0.25 MG tablet TAKE 3 TABLETS BY MOUTH AT BEDTIME 270 tablet 1  . levofloxacin (LEVAQUIN) 500 MG tablet Take 1 tablet (500 mg total) by mouth daily. (Patient not taking: Reported on 05/09/2018) 7 tablet 0  . predniSONE (DELTASONE) 10 MG tablet 5 tabs po qd x 4d, then 4 tabs po qd x 4d, then 3 tabs po qd x 4d, then 2 tabs po qd x 4d, then 1 tab po qd x 4d, then stop (Patient not taking: Reported on 05/09/2018) 60 tablet 0   No facility-administered medications prior to visit.     Allergies  Allergen Reactions  . Contrast Media [Iodinated Diagnostic Agents] Anaphylaxis    Needs 13-hour prep  . Shellfish Allergy Anaphylaxis  . Codeine Nausea And Vomiting and Other (See Comments)    Hallucinations, Also sees people that are not there   . Furosemide Rash    Rash, soB.  . Lamictal [Lamotrigine] Rash  . Ivermectin Nausea And Vomiting    N/V and rash  . Spiriva Handihaler [Tiotropium Bromide Monohydrate] Other (See Comments)    Severe headaches  . Betadine [Povidone Iodine] Rash  . Latex Hives  . Lipitor [Atorvastatin] Itching  . Permethrin Itching  . Tape Dermatitis    Paper tape and clear    ROS As per HPI  PE: Blood pressure 130/88, pulse 93, temperature 97.9 F (36.6 C), temperature source Oral, resp. rate 16, height 5\' 2"  (1.575 m), weight 274 lb (124.3 kg), last menstrual period 05/02/2018, SpO2 93 %.RA Gen: Alert, tired-appearing.  Patient is oriented to person, place, time, and situation. Room smells like cigarette  smoke. HEENT: eyes without injection, drainage, or swelling.  Ears: EACs clear, TMs with normal light reflex and landmarks.  Nose: Clear  rhinorrhea, with some dried, crusty exudate adherent to mildly injected mucosa.  No purulent d/c.  Mild bilat paranasal sinus and frontal sinusTTP.  Mild infraorbital facial swelling bilat.  Throat and mouth without focal lesion.  No pharyngial swelling, erythema, or exudate.  Tongue with dull white film adherent to it. Neck: supple, no LAD.   LUNGS: Inspiration is clear, aeration is decent and symmetric.  Exhalation --she has exp wheezing that is soft and exp phase is prolonged, nonlabored resps.   CV: RRR, no m/r/g. EXT: no c/c.  No pitting edema SKIN: no rash  LABS:    Chemistry      Component Value Date/Time   NA 136 05/29/2017 1638   K 4.2 05/29/2017 1638   CL 101 05/29/2017 1638   CO2 22 05/29/2017 1638   BUN 11 05/29/2017 1638   CREATININE 0.71 05/29/2017 1638      Component Value Date/Time   CALCIUM 9.6 05/29/2017 1638   ALKPHOS 71 05/29/2017 1638   AST 21 05/29/2017 1638   ALT 17 05/29/2017 1638   BILITOT 0.9 05/29/2017 1638     Lab Results  Component Value Date   WBC 11.9 (H) 01/30/2018   HGB 14.5 01/30/2018   HCT 42.6 01/30/2018   MCV 88.2 01/30/2018   PLT 368.0 01/30/2018    IMPRESSION AND PLAN:  1) Prolonged exacerbation of obstructive lung dz-->in the setting of morbid obesity with obesity hypoventilation syndrome, and ongoing tobacco abuse. She is difficult to get better.  I think is partially due to her chronic sinusitis + inability to clearly tell if she has true asthma or is this COPD/chronic bronchitis?   Her PFTs 04/2016 were normal and she did not respond to albuterol, but noncontrast high resolution CT chest 06/2016 showed some evidence of small airway dz (w/out any evidence for interstitial lung dz and no emphysematous changes).   Ongoing tob abuse doesn't help the situation any, and she is absolutely against a trial of cessation b/c she thinks it will make her unstable psychologically/mentally.  Plan is to check sinus x-rays to start.  Needs CT sinuses  if x-ray normal or equivocal. No further abx at this time b/c she really hasn't had improvement in upper resp or lower resp sx's from the last 2 courses of abx.  If radiographic evidence of sinusitis, will treat with at least 2 week course of clindamycin. Will start advair 250/50, 1p bid. Prednisone 60mg  qd x 3d, then 40mg  qd x 4d, then 20mg  qd x 5d, then 10mg  qd x 6d. Continue albut q4h prn. Continue home oxygen hs and daytime prn sat <92%. Refer to pulmonologist.  An After Visit Summary was printed and given to the patient.  FOLLOW UP: Return in about 2 weeks (around 05/23/2018) for f/u asthma exac/sinus.  Signed:  Crissie Sickles, MD           05/09/2018

## 2018-05-12 ENCOUNTER — Telehealth: Payer: Self-pay | Admitting: Family Medicine

## 2018-05-12 MED ORDER — FLUCONAZOLE 150 MG PO TABS: ORAL_TABLET | ORAL | 0 refills | Status: DC

## 2018-05-12 NOTE — Telephone Encounter (Signed)
Copied from [place] [id number]. Topic: Quick Communication - See Telephone Encounter >> May 12, 2018 12:44 PM Rutherford Nail, NT wrote: CRM for notification. See Telephone encounter for: 05/12/18. Patient calling and states that Dr Anitra Lauth has treated her twice with antibiotics and she is now having vaginal itching and "just feels very nasty down there." Would like to know if medication could be sent to the pharmacy for a yeast infection? [facility] [place], [place] - [address]

## 2018-05-12 NOTE — Telephone Encounter (Signed)
Fluconazole tabs eRx'd.

## 2018-05-13 NOTE — Telephone Encounter (Signed)
Patient advised and voiced understanding.  

## 2018-05-15 ENCOUNTER — Ambulatory Visit: Payer: Self-pay

## 2018-05-15 NOTE — Telephone Encounter (Signed)
Pt. Reports that after she uses her new Advair inhaler, she feels like her "throat closes up some and I can't get a deep breath." States she has tried it for 5-6 days and "just can't take it any more. " No shortness of breath or chest pain now.Took Benadryl earlier and feels better. Reports she wants to let Dr. Anitra Lauth know. States she is waiting on her Pulmonary referral - has not heard anything yet. Will hold Advair for now. Please advise. Instructed to go to ED if symptoms return.  Answer Assessment - Initial Assessment Questions 1. RESPIRATORY STATUS: "Describe your breathing?" (e.g., wheezing, shortness of breath, unable to speak, severe coughing)      No shortness of breath now 2. ONSET: "When did this breathing problem begin?"      When I started the Advair 3. PATTERN "Does the difficult breathing come and go, or has it been constant since it started?"      Comes and goes 4. SEVERITY: "How bad is your breathing?" (e.g., mild, moderate, severe)    - MILD: No SOB at rest, mild SOB with walking, speaks normally in sentences, can lay down, no retractions, pulse < 100.    - MODERATE: SOB at rest, SOB with minimal exertion and prefers to sit, cannot lie down flat, speaks in phrases, mild retractions, audible wheezing, pulse 100-120.    - SEVERE: Very SOB at rest, speaks in single words, struggling to breathe, sitting hunched forward, retractions, pulse > 120      No shortness of breath now 5. RECURRENT SYMPTOM: "Have you had difficulty breathing before?" If so, ask: "When was the last time?" and "What happened that time?"      Yes - has asthma 6. CARDIAC HISTORY: "Do you have any history of heart disease?" (e.g., heart attack, angina, bypass surgery, angioplasty)      nITRO FOR CHEST PAIN PRN 7. LUNG HISTORY: "Do you have any history of lung disease?"  (e.g., pulmonary embolus, asthma, emphysema)     Asthma 8. CAUSE: "What do you think is causing the breathing problem?"      Maybe the  Advair 9. OTHER SYMPTOMS: "Do you have any other symptoms? (e.g., dizziness, runny nose, cough, chest pain, fever)       No chest pain  10. PREGNANCY: "Is there any chance you are pregnant?" "When was your last menstrual period?"       No 11. TRAVEL: "Have you traveled out of the country in the last month?" (e.g., travel history, exposures)         No  Protocols used: BREATHING DIFFICULTY-A-AH

## 2018-05-15 NOTE — Telephone Encounter (Signed)
Diane, do you know what the status of this referral is (pulmonology: either Dr. Verdie Mosher at [facility].-thx

## 2018-05-15 NOTE — Telephone Encounter (Signed)
FYI

## 2018-05-16 NOTE — Telephone Encounter (Signed)
Patient has been scheduled at [facility] 05/22/18

## 2018-05-20 DIAGNOSIS — J449 Chronic obstructive pulmonary disease, unspecified: Secondary | ICD-10-CM | POA: Diagnosis not present

## 2018-05-20 DIAGNOSIS — R0902 Hypoxemia: Secondary | ICD-10-CM | POA: Diagnosis not present

## 2018-05-20 DIAGNOSIS — J441 Chronic obstructive pulmonary disease with (acute) exacerbation: Secondary | ICD-10-CM | POA: Diagnosis not present

## 2018-05-21 ENCOUNTER — Ambulatory Visit (HOSPITAL_COMMUNITY)
Admission: RE | Admit: 2018-05-21 | Discharge: 2018-05-21 | Disposition: A | Discharge status: Home / Self Care | Payer: Commercial Managed Care - PPO | Source: Ambulatory Visit | Attending: Family Medicine | Admitting: Family Medicine

## 2018-05-21 DIAGNOSIS — J329 Chronic sinusitis, unspecified: Secondary | ICD-10-CM | POA: Insufficient documentation

## 2018-05-22 ENCOUNTER — Encounter: Payer: Self-pay | Admitting: *Deleted

## 2018-05-22 ENCOUNTER — Institutional Professional Consult (permissible substitution): Payer: Commercial Managed Care - PPO | Admitting: Pulmonary Disease

## 2018-05-26 ENCOUNTER — Ambulatory Visit (INDEPENDENT_AMBULATORY_CARE_PROVIDER_SITE_OTHER): Payer: Commercial Managed Care - PPO | Admitting: Family Medicine

## 2018-05-26 ENCOUNTER — Encounter: Payer: Self-pay | Admitting: Family Medicine

## 2018-05-26 VITALS — BP 114/77 | HR 99 | Temp 98.0°F | Resp 16 | Ht 62.0 in | Wt 274.2 lb

## 2018-05-26 DIAGNOSIS — K219 Gastro-esophageal reflux disease without esophagitis: Secondary | ICD-10-CM

## 2018-05-26 DIAGNOSIS — R062 Wheezing: Secondary | ICD-10-CM

## 2018-05-26 DIAGNOSIS — Z720 Tobacco use: Secondary | ICD-10-CM

## 2018-05-26 DIAGNOSIS — J449 Chronic obstructive pulmonary disease, unspecified: Secondary | ICD-10-CM | POA: Diagnosis not present

## 2018-05-26 MED ORDER — PANTOPRAZOLE SODIUM 40 MG PO TBEC: 40.0000 mg | DELAYED_RELEASE_TABLET | Freq: Two times a day (BID) | ORAL | 1 refills | Status: DC

## 2018-05-26 NOTE — Progress Notes (Signed)
OFFICE VISIT  05/28/2018   CC:  Chief Complaint  Patient presents with  . Follow-up    asthma exacerbation and sinus    HPI:    Patient is a 53 y.o. Caucasian female who presents accompanied by her husband for 2 week f/u asthma exacerbation. Last visit I started her on advair 250/50, 1 p bid and started more steroids (18d taper starting at 60mg  qd). A sinus x-ray after last visit was completely normal.  I referred her to pulmonology at that time as well.  Interim hx: She has finished all prednisone and abx. She missed her appt with pulm, says she was mixed up about her appt date, has appt now with Dr. Melvyn Novas in a few days. Cough got a little better for a while, then seems to have picked back up over the last few days. Albut HFA and neb-->mildly helpful.  Chronic SOB/DOE but hard to tell if any different since I last saw her. No CP.  No fevers. Has some retrosternal burning.  Takes pantoprazole but still has daily sx's, occ regurgitates and has water brash, sx's worse postprandial and when supine.    ROS:  no dizziness, no HAs, no rashes, no melena/hematochezia.  No polyuria or polydipsia.    Past Medical History:  Diagnosis Date  . Anxiety   . Arthritis   . Bilateral leg pain 2018/19   ? venous reflux.  . Bipolar 1 disorder ([place])   . Bronchitis, mucopurulent recurrent ([place])    PFTs 04/2016 showed no sign of copd or asthma, plus she actually had worse FEV1 after albuterol.  . Chronic pain syndrome    chronic low back pain (Pain mgmt= Dr. [name]).  MRI showed no signif disc dz, only showed some facet arthropathy at L4-5, with joint diastasis at L4-5--plan as of 11/07/16 neurosurg eval is bilat facet injections.  . Colitis due to Clostridium difficile 2001  . Cyst of left kidney 2016   Initially noted at 3 cm size, simple appearing, on CT abd/pelv in 2016.  On MRI L spine w and w/out contrast it was larger, lobulated--->ultrasound was done for f/u and it was 4.1 cm w/out worrisome  features.  Marland Kitchen GERD (gastroesophageal reflux disease)   . Hepatic hemangioma   . Hepatic steatosis 06/2016   Noted incidentally on CT chest  . History of Salmonella gastroenteritis   . Hyperlipidemia    a. Noted 02/2014.  Marland Kitchen Hypertension    HCTZ started 07/2015  . IBS (irritable bowel syndrome)   . Iron deficiency anemia 05/2014   per pt it is not due to vaginal blood loss; hemoccults sent to pt in mail 413/16.  . Lumbar spondylosis    mild, mainly focused at L4-5 and L5-S1.  MRI L spine 09/09/17-->mild degenerative disc disease and facet arthrosis without any nerve root encroachment, no spinal stenosis.  . Microscopic hematuria    Intermittent (no w/u done yet, as of 03/2014)  . Migraine headache   . Morbid obesity ([place])   . Multiple lipomas 09/2016   left upper arm--very small ones.  . Nocturnal hypoxia 09/18/2017   Overnight oximetry -->qualified: ordered 2L oxygen during sleep.  . Obesity hypoventilation syndrome ([place])   . Pain in both lower legs 2018-2019   pain and numbness bilat LLs.  NCS/EMGs findings suggestive of S1 radiculopathy bilat: sx's + NCS findings suggestive of neurogenic claudication/spinal stenosis.  . Peripheral edema Fall 2018   R>L, non-pitting for the most part--venous doppler neg for DVT 01/2017.  Saw Dr. Bridgett Larsson (Vasc) 04/12/17 and u/s showed some venous reflux dz but he felt her sx's in legs were NOT due to her venous insufficiency OR PAD.  Thigh high comp stockings rx'd.  . Polycystic ovarian syndrome   . Pruritic dermatitis 2015/2016   +Scabies prep at [facility] 07/2014. (Primarily pruritic skin, but eventually a subtle rash as well)  . Raynaud's phenomenon 2019  . Rectal cancer ([place])    a. Followed by Dr. Gala Romney, dx 2000-2001. Tumor removed from rectal.  . Recurrent sinusitis    +allergic rhinitis  . Right shoulder pain 2017   Murphy/Wainer: RC bursitis + RC tear (MRI)--injection trial is plan per pt report 05/04/16.  . Tobacco dependence   . Vitamin  D deficiency     Past Surgical History:  Procedure Laterality Date  . ABI's complete Bilateral 09/30/2017   Normal ABIs.  Great toe pressures adequate for wound healing.  Femoral artery waveforms triphasic (normal).  . CARDIOVASCULAR STRESS TEST  03/24/14   Lexscan MIBI: mild anterior ischemia?  Cardiac CT angiogram recommended/done.  . CHOLECYSTECTOMY    . COLONOSCOPY  05/05/2007   adenoma  . coronary CT angio  03/29/14   Two vessel dz/moderate stenosis of mid LAD and proximal RCA; cath recommended.  Marland Kitchen DILATION AND CURETTAGE OF UTERUS     for vaginal bleeding  . JOINT REPLACEMENT    . LEFT HEART CATHETERIZATION WITH CORONARY ANGIOGRAM N/A 03/31/2014   Normal coronaries, EF 54-65%, diastolic dysfunction.  Procedure: LEFT HEART CATHETERIZATION WITH CORONARY ANGIOGRAM;  Surgeon: Sinclair Grooms, MD;  Location: [facility] CATH LAB;  Service: Cardiovascular;  Laterality: N/A;  . LOWER EXT VENOUS DOPPLERS Bilateral 02/08/2017   NEG for DVT (bilat)  . NCS/EMG  10/03/2017   S1 radiculopathy bilat  . Overnight oximetry testing  09/2017   qualified for oxygen supplementation during sleep  . PFTs  04/2016   No sign of COPD or asthma; FEV 1 worse after albut.  . Situ removed  age 51   High-grade rectal adenoma removed from rectum   . TRANSTHORACIC ECHOCARDIOGRAM  03/23/14; 10/21/17   2015 Normal.  2019: mild LVH, EF 65-70%, study technically inadequate to assess diastolic function, valves were fine, wall motion normal.    Outpatient Medications Prior to Visit  Medication Sig Dispense Refill  . albuterol (PROVENTIL HFA) 108 (90 Base) MCG/ACT inhaler Inhale 1-2 puffs into the lungs every 4 (four) hours as needed. 1 Inhaler 3  . albuterol (PROVENTIL) (2.5 MG/3ML) 0.083% nebulizer solution Take 3 mLs (2.5 mg total) by nebulization every 6 (six) hours as needed for wheezing or shortness of breath. 75 mL 6  . AMITIZA 24 MCG capsule Take 24 mcg by mouth 2 (two) times daily.  1  . aspirin 81 MG chewable tablet  Chew 1 tablet (81 mg total) by mouth daily. 14 tablet 0  . azelastine (ASTELIN) 0.1 % nasal spray Place 2 sprays into both nostrils 2 (two) times daily. 30 mL 6  . busPIRone (BUSPAR) 15 MG tablet 1 in the morning and 2 at bedtime    . cetirizine (ZYRTEC) 10 MG tablet TAKE 1 TABLET BY MOUTH ONCE DAILY 30 tablet 11  . clonazePAM (KLONOPIN) 0.5 MG tablet TAKE 1 TABLET BY MOUTH DURING THE DAY AS NEEDED AND 2 AT BEDTIME 90 tablet 5  . cyclobenzaprine (FLEXERIL) 10 MG tablet TAKE ONE TABLET BY MOUTH EVERY 6 HOURS AS NEEDED (SPASM RELATED TO HEADACHES) (Patient taking differently: Take 10 mg by mouth  3 (three) times daily. TAKE ONE TABLET BY MOUTH EVERY 6 HOURS AS NEEDED (SPASM RELATED TO HEADACHES)) 42 tablet 5  . diphenhydrAMINE (BENADRYL) 25 MG tablet Take 1 tablet (25 mg total) by mouth every 6 (six) hours. (Patient taking differently: Take 25-50 mg by mouth every 6 (six) hours as needed for itching or allergies. ) 20 tablet 0  . fluconazole (DIFLUCAN) 150 MG tablet 1 tab po qd x 3d 3 tablet 0  . fluticasone (FLONASE) 50 MCG/ACT nasal spray Place 2 sprays into both nostrils daily. 16 g 1  . Fluticasone-Salmeterol (ADVAIR) 250-50 MCG/DOSE AEPB Inhale 1 puff into the lungs 2 (two) times daily. 60 each 6  . hydrOXYzine (ATARAX/VISTARIL) 25 MG tablet Take 25 mg by mouth 3 (three) times daily.     Marland Kitchen IRON-FOLIC ACID PO Take 65 mg by mouth daily.     Marland Kitchen lamoTRIgine (LAMICTAL) 100 MG tablet Take 200 mg by mouth at bedtime.    Marland Kitchen LATUDA 40 MG TABS tablet TAKE 1 TABLET BY MOUTH ONCE DAILY IN THE EVENING WITH FOOD (AT LEAST 350 CALORIES)  1  . losartan (COZAAR) 50 MG tablet Take 1 tablet (50 mg total) by mouth daily. 90 tablet 1  . LYRICA 150 MG capsule Take 1 tablet by mouth 3 (three) times daily.  2  . Magnesium-Potassium 40-40 MG CAPS Take 1 capsule by mouth daily.    . meloxicam (MOBIC) 15 MG tablet Take 15 mg by mouth daily.  1  . metoprolol tartrate (LOPRESSOR) 50 MG tablet Take 1 tablet (50 mg total) by  mouth 2 (two) times daily. 180 tablet 3  . Naldemedine Tosylate (SYMPROIC) 0.2 MG TABS Take 1 tablet by mouth daily.    . nitroGLYCERIN (NITROSTAT) 0.4 MG SL tablet Place 1 tablet (0.4 mg total) under the tongue every 5 (five) minutes as needed for chest pain. 20 tablet 0  . Oxcarbazepine (TRILEPTAL) 300 MG tablet Take 150 mg by mouth daily.    Marland Kitchen oxyCODONE-acetaminophen (PERCOCET) 10-325 MG tablet Take 1 tablet by mouth 3 (three) times daily.     . promethazine (PHENERGAN) 25 MG tablet TAKE 1 TABLET BY MOUTH EVERY 6 HOURS AS NEEDED FOR NAUSEA AND VOMITING 30 tablet 3  . rOPINIRole (REQUIP) 0.25 MG tablet TAKE 3 TABLETS BY MOUTH AT BEDTIME 270 tablet 1  . pantoprazole (PROTONIX) 40 MG tablet TAKE 1 TABLET BY MOUTH ONCE DAILY 90 tablet 1  . predniSONE (DELTASONE) 20 MG tablet 3 tabs po qd x 3d, then 2 tabs po qd x 4d, then 1 tab po qd x 5d, then 1/2 tab po qd x 6d, then stop (Patient not taking: Reported on 05/26/2018) 26 tablet 0   No facility-administered medications prior to visit.     Allergies  Allergen Reactions  . Contrast Media [Iodinated Diagnostic Agents] Anaphylaxis    Needs 13-hour prep  . Shellfish Allergy Anaphylaxis  . Codeine Nausea And Vomiting and Other (See Comments)    Hallucinations, Also sees people that are not there   . Furosemide Rash    Rash, soB.  . Lamictal [Lamotrigine] Rash  . Ivermectin Nausea And Vomiting    N/V and rash  . Spiriva Handihaler [Tiotropium Bromide Monohydrate] Other (See Comments)    Severe headaches  . Betadine [Povidone Iodine] Rash  . Latex Hives  . Lipitor [Atorvastatin] Itching  . Permethrin Itching  . Tape Dermatitis    Paper tape and clear    ROS As per HPI  PE: Blood pressure  114/77, pulse 99, temperature 98 F (36.7 C), temperature source Oral, resp. rate 16, height 5\' 2"  (1.575 m), weight 274 lb 4 oz (124.4 kg), last menstrual period 05/02/2018, SpO2 94 %.Room air Body mass index is 50.16 kg/m.  Gen: alert,  tired-appearing but NAD and nontoxic. AFFECT: pretty morose as per her usual. ENT: mild chronic peri-orbital STS. Nose with mild turbinate injection/edema.  No mucous. Neck - No masses or thyromegaly. CV: RRR, no m/r/g LUNGS: CTA bilat except when she gets about 1/2 way through exhalation and this is when she has a wheeze diffusely + prolongation of exp phase.  nonlabored resps. EXT: no c/c.  NO pitting edema.  Chronic "doughy" fullness of soft tissues of lower legs/ankles/feet.   LABS:    Chemistry      Component Value Date/Time   NA 136 05/29/2017 1638   K 4.2 05/29/2017 1638   CL 101 05/29/2017 1638   CO2 22 05/29/2017 1638   BUN 11 05/29/2017 1638   CREATININE 0.71 05/29/2017 1638      Component Value Date/Time   CALCIUM 9.6 05/29/2017 1638   ALKPHOS 71 05/29/2017 1638   AST 21 05/29/2017 1638   ALT 17 05/29/2017 1638   BILITOT 0.9 05/29/2017 1638     Lab Results  Component Value Date   HGBA1C 5.9 (H) 03/23/2014    IMPRESSION AND PLAN:  1) Prolonged acute exac of COPD--> in the setting of morbid obesity with obesity hypoventilation syndrome and ongoing tobacco abuse. No sign of sinusitis on recent plain films. I don't think any further steroids are going to help her at this time. No abx indicated at this time. Keep appt for pulm evaluation. Considering ENT eval if pulm does not think her wheezing is pulmonary.  Continue advair, albut q4h prn, encouraged complete smoking cessation but she won't consider this b/c of her fear of psychologic/emotional repercussions this may cause.  2) GERD. Question that this may be causing upper airway irritation/inflammation enough to cause cough/pseudo-wheeze. Can't get swallowing study to eval for aspiration b/c pt allergic to barium. Increase pantoprazole to 40mg    An After Visit Summary was printed and given to the patient.  FOLLOW UP: Return in about 4 weeks (around 06/23/2018) for routine chronic illness f/u (30  min).  Signed:  Crissie Sickles, MD           05/28/2018

## 2018-05-29 ENCOUNTER — Telehealth: Payer: Self-pay | Admitting: *Deleted

## 2018-05-29 ENCOUNTER — Institutional Professional Consult (permissible substitution): Payer: Commercial Managed Care - PPO | Admitting: Internal Medicine

## 2018-05-29 NOTE — Telephone Encounter (Signed)
PA sent via covermymed on 05/29/18   Key: [id number]   Medication: pantoprazole 40mg  tab: 1 tab BID #60/30   Dx: GERD - K21.9   Per Dr. Anitra Lauth pt has tried and failed: Nexium, Pepcid, Prilosec, Zantac    Waiting for response.

## 2018-05-29 NOTE — Telephone Encounter (Signed)
PA was approved. Letter of approval faxed to [place].

## 2018-06-18 ENCOUNTER — Telehealth: Payer: Self-pay | Admitting: Family Medicine

## 2018-06-18 ENCOUNTER — Institutional Professional Consult (permissible substitution): Payer: Commercial Managed Care - PPO | Admitting: Internal Medicine

## 2018-06-18 DIAGNOSIS — J449 Chronic obstructive pulmonary disease, unspecified: Secondary | ICD-10-CM | POA: Diagnosis not present

## 2018-06-18 DIAGNOSIS — J441 Chronic obstructive pulmonary disease with (acute) exacerbation: Secondary | ICD-10-CM | POA: Diagnosis not present

## 2018-06-18 DIAGNOSIS — R092 Respiratory arrest: Secondary | ICD-10-CM | POA: Diagnosis not present

## 2018-06-18 NOTE — Telephone Encounter (Signed)
Patient states she actually feels like she's beginning to get sinus infection, denies fever but is having ear pain x 3 days, sinus drainage but no fluid draining from ears.  She states that her ears hurt badly when she blows her nose and they pop.  She states you gave her ear drops ( unsure of name ) but she states that they are to "numb" and they do help some of the pain.  She states you normally give her antibiotic and steroids when this happens.  Please advise.

## 2018-06-18 NOTE — Telephone Encounter (Signed)
Copied from [place] [id number]. Topic: General - Other >> Jun 18, 2018 11:09 AM Antonieta Iba C wrote: Reason for CRM: pt says that she is suppose to go see  pulmonary today at 2:15p. (PCP referred) Pt says due to everything with the corona virus she doesn't feel comfortable going to ov. Pt would like to make her PCP aware of this and to also see what does he think about her missing apt?     CB: [phone]

## 2018-06-18 NOTE — Telephone Encounter (Signed)
I can't do anything about "yuck" in her ears with a telephone visit. Can she be mores specific about what she means?  ?pain, ?drainage, ? Any treatments/drops tried? Let me know.  Thanks!

## 2018-06-18 NOTE — Telephone Encounter (Signed)
Based on her symptoms she either has a cold virus or nasal allergy symptoms. No antibiotic needed b/c it would not help.   Her ears hurt b/c the eustachian tube that equalizes ear canal pressure is closed up from the cold virus or allergy response.  The only treatment is use of a nasal saline spray-->2 sprays each nostril 3-4 times per day.  Her ears may pop frequently but it won't harm her.  Reassure her.-thx

## 2018-06-18 NOTE — Telephone Encounter (Signed)
I agree with her decision to cancel her pulm appt today. I recommend she stay home as much as possible.   We are not seeing any patients with any kind of respiratory symptoms in our office right now either. If she has respiratory distress then she should go to the nearest ED or call 911. Otherwise, just hang in there.  Reschedule pulm appt for a couple of weeks from now.-thx

## 2018-06-18 NOTE — Telephone Encounter (Signed)
Pt was called and given information. Pt verbalized understanding  

## 2018-06-18 NOTE — Telephone Encounter (Signed)
Patient states that she has some "yuck" in her ears like she normally gets.   Can you send in RX for her or can we do telephone visit?  Please advise.

## 2018-06-19 DIAGNOSIS — M79606 Pain in leg, unspecified: Secondary | ICD-10-CM | POA: Diagnosis not present

## 2018-06-19 DIAGNOSIS — R252 Cramp and spasm: Secondary | ICD-10-CM | POA: Diagnosis not present

## 2018-06-19 DIAGNOSIS — M797 Fibromyalgia: Secondary | ICD-10-CM | POA: Diagnosis not present

## 2018-06-19 DIAGNOSIS — G603 Idiopathic progressive neuropathy: Secondary | ICD-10-CM | POA: Diagnosis not present

## 2018-06-25 ENCOUNTER — Encounter: Payer: Self-pay | Admitting: Family Medicine

## 2018-06-25 ENCOUNTER — Ambulatory Visit (INDEPENDENT_AMBULATORY_CARE_PROVIDER_SITE_OTHER): Payer: Commercial Managed Care - PPO | Admitting: Family Medicine

## 2018-06-25 ENCOUNTER — Ambulatory Visit: Payer: Commercial Managed Care - PPO | Admitting: Family Medicine

## 2018-06-25 ENCOUNTER — Other Ambulatory Visit: Payer: Self-pay

## 2018-06-25 VITALS — BP 124/81 | HR 91

## 2018-06-25 DIAGNOSIS — J441 Chronic obstructive pulmonary disease with (acute) exacerbation: Secondary | ICD-10-CM

## 2018-06-25 DIAGNOSIS — J329 Chronic sinusitis, unspecified: Secondary | ICD-10-CM

## 2018-06-25 DIAGNOSIS — K219 Gastro-esophageal reflux disease without esophagitis: Secondary | ICD-10-CM

## 2018-06-25 DIAGNOSIS — E662 Morbid (severe) obesity with alveolar hypoventilation: Secondary | ICD-10-CM

## 2018-06-25 DIAGNOSIS — Z720 Tobacco use: Secondary | ICD-10-CM

## 2018-06-25 DIAGNOSIS — G4734 Idiopathic sleep related nonobstructive alveolar hypoventilation: Secondary | ICD-10-CM | POA: Diagnosis not present

## 2018-06-25 MED ORDER — PREDNISONE 5 MG PO TABS: 5.0000 mg | ORAL_TABLET | Freq: Every day | ORAL | 0 refills | Status: DC

## 2018-06-25 MED ORDER — AMOXICILLIN-POT CLAVULANATE 875-125 MG PO TABS: 1.0000 | ORAL_TABLET | Freq: Two times a day (BID) | ORAL | 0 refills | Status: DC

## 2018-06-25 MED ORDER — PREDNISONE 20 MG PO TABS: ORAL_TABLET | ORAL | 0 refills | Status: DC

## 2018-06-25 NOTE — Progress Notes (Signed)
Virtual Visit via Video Note  I connected with Martha Espinoza on 06/25/18 at  1:30 PM EDT by a video enabled telemedicine application and verified that I am speaking with the correct person using two identifiers.  Location patient: home Location provider:work office Persons participating in the virtual visit: patient, provider  I discussed the limitations of evaluation and management by telemedicine and the availability of in person appointments. The patient expressed understanding and agreed to proceed.   HPI: One mo f/u COPD, recurrent URI/sinusitis (yet plain film sinuses normal in the midst of sinusitis sx's). At last visit she had just finished an extended prednisone taper, was having mild wheezing/cough and beginning to feel "bad" again, but ultimately we decided to NOT change mgmt at that time.  A/P as of 05/26/2018: "Prolonged acute exac of COPD--> in the setting of morbid obesity with obesity hypoventilation syndrome and ongoing tobacco abuse. No sign of sinusitis on recent plain films. I don't think any further steroids are going to help her at this time. No abx indicated at this time. Keep appt for pulm evaluation. Considering ENT eval if pulm does not think her wheezing is pulmonary. Continue advair, albut q4h prn, encouraged complete smoking cessation but she won't consider this b/c of her fear of psychologic/emotional repercussions this may cause."  I also increased her pantoprazole to bid in case GERD was causing upper airway cough syndrome/cyclical cough/reflux/cough/reflux. At that time, pulm consult was made but date was not solidified. She ended up cancelling most recent pulm eval b/c of fear of COVID-19.  Interim Hx: starting to get nasal congestion, sinus/facial pressure, ears hurting, yellow mucous from nose--->going on at least 10d,  Some coughing, taking tylenol cough, oxygen sats down to 80s some at rest, up to 90s at times. Some SOB and wheezing, uses albuterol hfa and  albuterol nebs multiple times per day-->helpful. Says lower legs swelling is stable--mostly ankles and midway up calves.  No venous reflux.   Denies GERD sx's since getting on PPI bid. No ST, no globus sensation.  We have her on lopressor, a med that I continue to mention may be causing bronchospasm, but she is resistant to change and doesn't want to retry a CCB for her hx of symptomatic sinus tachy.  Says HR consistently 90 at home.    No snoring, no witnessed apneic events in sleep, no excessive daytime sleepiness except for some of her psych med side effects.  ROS: See pertinent positives and negatives per HPI. Also, chronic low and mid back pain->seeing chiropractor.  Past Medical History:  Diagnosis Date  . Anxiety   . Arthritis   . Bilateral leg pain 2018/19   ? venous reflux.  . Bipolar 1 disorder ([place])   . Bronchitis, mucopurulent recurrent ([place])    PFTs 04/2016 showed no sign of copd or asthma, plus she actually had worse FEV1 after albuterol.  . Chronic pain syndrome    chronic low back pain (Pain mgmt= Dr. [name]).  MRI showed no signif disc dz, only showed some facet arthropathy at L4-5, with joint diastasis at L4-5--plan as of 11/07/16 neurosurg eval is bilat facet injections.  . Colitis due to Clostridium difficile 2001  . Cyst of left kidney 2016   Initially noted at 3 cm size, simple appearing, on CT abd/pelv in 2016.  On MRI L spine w and w/out contrast it was larger, lobulated--->ultrasound was done for f/u and it was 4.1 cm w/out worrisome features.  Marland Kitchen GERD (gastroesophageal reflux disease)   .  Hepatic hemangioma   . Hepatic steatosis 06/2016   Noted incidentally on CT chest  . History of Salmonella gastroenteritis   . Hyperlipidemia    a. Noted 02/2014.  Marland Kitchen Hypertension    HCTZ started 07/2015  . IBS (irritable bowel syndrome)   . Iron deficiency anemia 05/2014   per Martha Espinoza it is not due to vaginal blood loss; hemoccults sent to Martha Espinoza in mail 413/16.  . Lumbar  spondylosis    mild, mainly focused at L4-5 and L5-S1.  MRI L spine 09/09/17-->mild degenerative disc disease and facet arthrosis without any nerve root encroachment, no spinal stenosis.  . Microscopic hematuria    Intermittent (no w/u done yet, as of 03/2014)  . Migraine headache   . Morbid obesity ([place])   . Multiple lipomas 09/2016   left upper arm--very small ones.  . Nocturnal hypoxia 09/18/2017   Overnight oximetry -->qualified: ordered 2L oxygen during sleep.  . Obesity hypoventilation syndrome ([place])   . Pain in both lower legs 2018-2019   pain and numbness bilat LLs.  NCS/EMGs findings suggestive of S1 radiculopathy bilat: sx's + NCS findings suggestive of neurogenic claudication/spinal stenosis.  . Peripheral edema Fall 2018   R>L, non-pitting for the most part--venous doppler neg for DVT 01/2017.  Saw Dr. Bridgett Larsson (Vasc) 04/12/17 and u/s showed some venous reflux dz but he felt her sx's in legs were NOT due to her venous insufficiency OR PAD.  Thigh high comp stockings rx'd.  . Polycystic ovarian syndrome   . Pruritic dermatitis 2015/2016   +Scabies prep at [facility] 07/2014. (Primarily pruritic skin, but eventually a subtle rash as well)  . Raynaud's phenomenon 2019  . Rectal cancer ([place])    a. Followed by Dr. Gala Romney, dx 2000-2001. Tumor removed from rectal.  . Recurrent sinusitis    +allergic rhinitis  . Right shoulder pain 2017   [facility]: RC bursitis + RC tear (MRI)--injection trial is plan per Martha Espinoza report 05/04/16.  . Tobacco dependence   . Vitamin D deficiency     Past Surgical History:  Procedure Laterality Date  . ABI's complete Bilateral 09/30/2017   Normal ABIs.  Great toe pressures adequate for wound healing.  Femoral artery waveforms triphasic (normal).  . CARDIOVASCULAR STRESS TEST  03/24/14   Lexscan MIBI: mild anterior ischemia?  Cardiac CT angiogram recommended/done.  . CHOLECYSTECTOMY    . COLONOSCOPY  05/05/2007   adenoma  . coronary CT angio  03/29/14    Two vessel dz/moderate stenosis of mid LAD and proximal RCA; cath recommended.  Marland Kitchen DILATION AND CURETTAGE OF UTERUS     for vaginal bleeding  . JOINT REPLACEMENT    . LEFT HEART CATHETERIZATION WITH CORONARY ANGIOGRAM N/A 03/31/2014   Normal coronaries, EF 36-64%, diastolic dysfunction.  Procedure: LEFT HEART CATHETERIZATION WITH CORONARY ANGIOGRAM;  Surgeon: Sinclair Grooms, MD;  Location: [facility] CATH LAB;  Service: Cardiovascular;  Laterality: N/A;  . LOWER EXT VENOUS DOPPLERS Bilateral 02/08/2017   NEG for DVT (bilat)  . NCS/EMG  10/03/2017   S1 radiculopathy bilat  . Overnight oximetry testing  09/2017   qualified for oxygen supplementation during sleep  . PFTs  04/2016   No sign of COPD or asthma; FEV 1 worse after albut.  . Situ removed  age 46   High-grade rectal adenoma removed from rectum   . TRANSTHORACIC ECHOCARDIOGRAM  03/23/14; 10/21/17   2015 Normal.  2019: mild LVH, EF 65-70%, study technically inadequate to assess diastolic function, valves were fine,  wall motion normal.   ECHO 10/21/2017: Study Conclusions  - Left ventricle: The cavity size was normal. Wall thickness was   increased in a pattern of mild LVH. Systolic function was   vigorous. The estimated ejection fraction was in the range of 65%   to 70%. Wall motion was normal; there were no regional wall   motion abnormalities. The study is not technically sufficient to   allow evaluation of LV diastolic function. - Aortic valve: Mildly calcified annulus. Trileaflet; normal   thickness leaflets. Valve area (VTI): 2.32 cm^2. Valve area   (Vmax): 2.11 cm^2. - Technically adequate study  CT chest high resolution 07/06/16: CLINICAL DATA:  53 year old female with history of chronic cough and shortness of breath. Dyspnea. Wheezing.  EXAM: CT CHEST WITHOUT CONTRAST  TECHNIQUE: Multidetector CT imaging of the chest was performed following the standard protocol without intravenous contrast. High resolution imaging  of the lungs, as well as inspiratory and expiratory imaging, was performed.  COMPARISON:  Cardiac CT 03/29/2014.  FINDINGS: Cardiovascular: Heart size is normal. There is no significant pericardial fluid, thickening or pericardial calcification. There is aortic atherosclerosis, as well as atherosclerosis of the great vessels of the mediastinum and the coronary arteries, including calcified atherosclerotic plaque in the left main, left anterior descending and right coronary arteries.  Mediastinum/Nodes: No pathologically enlarged mediastinal or hilar lymph nodes. Please note that accurate exclusion of hilar adenopathy is limited on noncontrast CT scans. Esophagus is unremarkable in appearance. No axillary lymphadenopathy.  Lungs/Pleura: High-resolution images demonstrate no significant regions of ground-glass attenuation, subpleural reticulation, parenchymal banding, traction bronchiectasis or frank honeycombing. Inspiratory and expiratory imaging demonstrates some mild air trapping, indicative of mild small airways disease. No acute consolidative airspace disease. No pleural effusions. 3 mm ground-glass attenuation nodule in the left upper lobe (image 52 of series 5) is smaller than prior study from 03/29/2014, considered benign. No suspicious appearing pulmonary nodules or masses.  Upper Abdomen: Mild diffuse low attenuation throughout the visualized hepatic parenchyma, indicative of mild hepatic steatosis. Status post cholecystectomy.  Musculoskeletal: Small sclerotic lesions with narrow zone of transition are again noted in the posterior aspect of the T9 vertebral body and in the superior endplate of the L1 vertebral body. The largest of these is in the T9 vertebral body, currently measuring 0.9 x 0.6 x 1.0 cm (axial image 90 of series 5 and sagittal image 55 of series 8), which is larger than the prior examination from 2016 at which point this lesion measured only up  to 5 mm. The smaller lesion in L1 also appears slightly larger than the prior study, currently measuring up to 6 mm (previously only 4 mm).  IMPRESSION: 1. No evidence of interstitial lung disease. 2. Mild air trapping, indicative of small airways disease. 3. Aortic atherosclerosis, in addition to left main and 2 vessel coronary artery disease. Please note that although the presence of coronary artery calcium documents the presence of coronary artery disease, the severity of this disease and any potential stenosis cannot be assessed on this non-gated CT examination. Assessment for potential risk factor modification, dietary therapy or pharmacologic therapy may be warranted, if clinically indicated. 4. Mild hepatic steatosis. 5. Slight enlargement of sclerotic lesions in T9 and L1, as discussed above. These have a generally benign appearance, favored to represent bone islands, however the interval growth is unusual. The possibility of osteoblastic metastasis should be considered, although not strongly favored. Accordingly, in this demographic, correlation with mammography is suggested if clinically appropriate.  Of  not->f/u mammo was normal.  Family History  Problem Relation Age of Onset  . Colon cancer Father 43  . Cancer Father   . Diverticulitis Mother   . Heart disease Mother   . Hypertension Mother   . Hyperlipidemia Mother   . Mental illness Mother   . Diabetes Mother   . Heart attack Mother   . Stroke Neg Hx      Current Outpatient Medications:  .  albuterol (PROVENTIL HFA) 108 (90 Base) MCG/ACT inhaler, Inhale 1-2 puffs into the lungs every 4 (four) hours as needed., Disp: 1 Inhaler, Rfl: 3 .  albuterol (PROVENTIL) (2.5 MG/3ML) 0.083% nebulizer solution, Take 3 mLs (2.5 mg total) by nebulization every 6 (six) hours as needed for wheezing or shortness of breath., Disp: 75 mL, Rfl: 6 .  AMITIZA 24 MCG capsule, Take 24 mcg by mouth 2 (two) times daily., Disp: , Rfl:  1 .  aspirin 81 MG chewable tablet, Chew 1 tablet (81 mg total) by mouth daily., Disp: 14 tablet, Rfl: 0 .  azelastine (ASTELIN) 0.1 % nasal spray, Place 2 sprays into both nostrils 2 (two) times daily., Disp: 30 mL, Rfl: 6 .  busPIRone (BUSPAR) 15 MG tablet, 1 in the morning and 2 at bedtime, Disp: , Rfl:  .  cetirizine (ZYRTEC) 10 MG tablet, TAKE 1 TABLET BY MOUTH ONCE DAILY, Disp: 30 tablet, Rfl: 11 .  clonazePAM (KLONOPIN) 0.5 MG tablet, TAKE 1 TABLET BY MOUTH DURING THE DAY AS NEEDED AND 2 AT BEDTIME, Disp: 90 tablet, Rfl: 5 .  cyclobenzaprine (FLEXERIL) 10 MG tablet, TAKE ONE TABLET BY MOUTH EVERY 6 HOURS AS NEEDED (SPASM RELATED TO HEADACHES) (Patient taking differently: Take 10 mg by mouth 3 (three) times daily. TAKE ONE TABLET BY MOUTH EVERY 6 HOURS AS NEEDED (SPASM RELATED TO HEADACHES)), Disp: 42 tablet, Rfl: 5 .  diphenhydrAMINE (BENADRYL) 25 MG tablet, Take 1 tablet (25 mg total) by mouth every 6 (six) hours. (Patient taking differently: Take 25-50 mg by mouth every 6 (six) hours as needed for itching or allergies. ), Disp: 20 tablet, Rfl: 0 .  fluticasone (FLONASE) 50 MCG/ACT nasal spray, Place 2 sprays into both nostrils daily., Disp: 16 g, Rfl: 1 .  hydrOXYzine (ATARAX/VISTARIL) 25 MG tablet, Take 25 mg by mouth 3 (three) times daily. , Disp: , Rfl:  .  IRON-FOLIC ACID PO, Take 65 mg by mouth daily. , Disp: , Rfl:  .  lamoTRIgine (LAMICTAL) 100 MG tablet, Take 200 mg by mouth at bedtime., Disp: , Rfl:  .  losartan (COZAAR) 50 MG tablet, Take 1 tablet (50 mg total) by mouth daily., Disp: 90 tablet, Rfl: 1 .  LYRICA 150 MG capsule, Take 1 tablet by mouth 3 (three) times daily., Disp: , Rfl: 2 .  Magnesium-Potassium 40-40 MG CAPS, Take 1 capsule by mouth daily., Disp: , Rfl:  .  meloxicam (MOBIC) 15 MG tablet, Take 15 mg by mouth daily., Disp: , Rfl: 1 .  metoprolol tartrate (LOPRESSOR) 50 MG tablet, Take 1 tablet (50 mg total) by mouth 2 (two) times daily., Disp: 180 tablet, Rfl: 3 .   Naldemedine Tosylate (SYMPROIC) 0.2 MG TABS, Take 1 tablet by mouth daily., Disp: , Rfl:  .  nitroGLYCERIN (NITROSTAT) 0.4 MG SL tablet, Place 1 tablet (0.4 mg total) under the tongue every 5 (five) minutes as needed for chest pain., Disp: 20 tablet, Rfl: 0 .  oxyCODONE-acetaminophen (PERCOCET) 10-325 MG tablet, Take 1 tablet by mouth 3 (three) times daily. ,  Disp: , Rfl:  .  pantoprazole (PROTONIX) 40 MG tablet, Take 1 tablet (40 mg total) by mouth 2 (two) times daily., Disp: 60 tablet, Rfl: 1 .  promethazine (PHENERGAN) 25 MG tablet, TAKE 1 TABLET BY MOUTH EVERY 6 HOURS AS NEEDED FOR NAUSEA AND VOMITING, Disp: 30 tablet, Rfl: 3 .  rOPINIRole (REQUIP) 0.25 MG tablet, TAKE 3 TABLETS BY MOUTH AT BEDTIME, Disp: 270 tablet, Rfl: 1 .  LATUDA 40 MG TABS tablet, TAKE 1 TABLET BY MOUTH ONCE DAILY IN THE EVENING WITH FOOD (AT LEAST 350 CALORIES), Disp: , Rfl: 1  EXAM:  VITALS per patient if applicable:BP 952/84 (BP Location: Left Arm, Patient Position: Sitting, Cuff Size: Large)   Pulse 91    GENERAL: alert, oriented, appears tired and in no acute distress  HEENT: atraumatic, conjunttiva clear, no obvious abnormalities on inspection of external nose and ears.  Infra-orbital regions mildly swollen, symmetric and chronic. Voice is hoarse, as usual.  NECK: normal movements of the head and neck  LUNGS: on inspection no signs of respiratory distress, breathing rate appears normal, no obvious gross SOB, gasping or wheezing  CV: no obvious cyanosis  MS: moves all visible extremities without noticeable abnormality  PSYCH/NEURO: pleasant and cooperative, no obvious depression or anxiety, speech and thought processing grossly intact  LABS:  None today    Chemistry      Component Value Date/Time   NA 136 05/29/2017 1638   K 4.2 05/29/2017 1638   CL 101 05/29/2017 1638   CO2 22 05/29/2017 1638   BUN 11 05/29/2017 1638   CREATININE 0.71 05/29/2017 1638      Component Value Date/Time   CALCIUM  9.6 05/29/2017 1638   ALKPHOS 71 05/29/2017 1638   AST 21 05/29/2017 1638   ALT 17 05/29/2017 1638   BILITOT 0.9 05/29/2017 1638     Lab Results  Component Value Date   WBC 11.9 (H) 01/30/2018   HGB 14.5 01/30/2018   HCT 42.6 01/30/2018   MCV 88.2 01/30/2018   PLT 368.0 01/30/2018   Lab Results  Component Value Date   TSH 1.12 10/08/2016   Lab Results  Component Value Date   CHOL 263 (H) 03/24/2014   HDL 47 03/24/2014   LDLCALC 169 (H) 03/24/2014   TRIG 237 (H) 03/24/2014   CHOLHDL 5.6 03/24/2014   Lab Results  Component Value Date   HGBA1C 5.9 (H) 03/23/2014    ASSESSMENT AND PLAN:  Discussed the following assessment and plan:  Recurrent allergic + infectious rhinosinusitis + acute exac of COPD--> in the setting of morbid obesity with obesity hypoventilation syndrome and ongoing tobacco abuse. No sign of sinusitis on recent plain films. She is on home oxygen nocturnally and prn daytime. She is on daily prophylactic inhaler therapy and has been using albuterol regularly still. Says augmentin and prednisone seemed to help most recently.  ?sleep study ever?-no , denies sx's. ?stop toprol and start CCB? ? verap ineffective?-->recommended change to diltiazem trial but Martha Espinoza declined. Prednisone 40mg  qd x 5d,  Then start daily 5mg  prednisone dosing for chronic/recurrent allergic rhinitis/sinusitis AND chronic severe asthma. Therapeutic expectations and side effect profile of long term use of prednisone discussed today.  Patient's questions answered.  No labs imminently needed at this time. Keep plan to reschedule with Pulm for initial eval -->after COVID-19 restrictions lifted. Keep plan for ENT eval in distant future (after pulm) to eval chronic nasal congestion, chronic hoarseness, and recurrent sinusitis sx's w/out plain film evidence of any  sinus inflammation or infection.  I discussed the assessment and treatment plan with the patient. The patient was provided an  opportunity to ask questions and all were answered. The patient agreed with the plan and demonstrated an understanding of the instructions.   The patient was advised to call back or seek an in-person evaluation if the symptoms worsen or if the condition fails to improve as anticipated.   F/u: 1 mo virtual visit  Signed:  Crissie Sickles, MD           06/25/2018

## 2018-07-19 DIAGNOSIS — R0902 Hypoxemia: Secondary | ICD-10-CM | POA: Diagnosis not present

## 2018-07-19 DIAGNOSIS — J449 Chronic obstructive pulmonary disease, unspecified: Secondary | ICD-10-CM | POA: Diagnosis not present

## 2018-07-19 DIAGNOSIS — J441 Chronic obstructive pulmonary disease with (acute) exacerbation: Secondary | ICD-10-CM | POA: Diagnosis not present

## 2018-07-21 ENCOUNTER — Telehealth: Payer: Self-pay | Admitting: Family Medicine

## 2018-07-21 MED ORDER — FLUCONAZOLE 150 MG PO TABS: ORAL_TABLET | ORAL | 1 refills | Status: DC

## 2018-07-21 NOTE — Telephone Encounter (Signed)
Copied from [place] [phone]. Topic: Quick Communication - See Telephone Encounter >> Jul 21, 2018  8:59 AM Rayann Heman wrote: CRM for notification. See Telephone encounter for: 07/21/18. Pt called and stated that she finished her antibiotic amoxicillin-clavulanate (AUGMENTIN) 875-125 MG tablet [[id number]]. Pt states that she now has a yeast infection and would like something called in for it. Please advise

## 2018-07-21 NOTE — Telephone Encounter (Signed)
Pt was made aware med was sent.

## 2018-07-21 NOTE — Telephone Encounter (Signed)
OK, fluconazole eRx'd 

## 2018-07-24 ENCOUNTER — Other Ambulatory Visit: Payer: Self-pay

## 2018-07-24 ENCOUNTER — Ambulatory Visit (INDEPENDENT_AMBULATORY_CARE_PROVIDER_SITE_OTHER): Payer: Commercial Managed Care - PPO | Admitting: Family Medicine

## 2018-07-24 ENCOUNTER — Ambulatory Visit: Payer: Commercial Managed Care - PPO | Admitting: Family Medicine

## 2018-07-24 ENCOUNTER — Encounter: Payer: Self-pay | Admitting: Family Medicine

## 2018-07-24 VITALS — BP 115/67 | HR 94 | Ht 62.0 in | Wt 264.0 lb

## 2018-07-24 DIAGNOSIS — Z7952 Long term (current) use of systemic steroids: Secondary | ICD-10-CM

## 2018-07-24 DIAGNOSIS — F17200 Nicotine dependence, unspecified, uncomplicated: Secondary | ICD-10-CM | POA: Diagnosis not present

## 2018-07-24 DIAGNOSIS — J309 Allergic rhinitis, unspecified: Secondary | ICD-10-CM | POA: Diagnosis not present

## 2018-07-24 DIAGNOSIS — J329 Chronic sinusitis, unspecified: Secondary | ICD-10-CM

## 2018-07-24 DIAGNOSIS — J441 Chronic obstructive pulmonary disease with (acute) exacerbation: Secondary | ICD-10-CM

## 2018-07-24 MED ORDER — PREDNISONE 10 MG PO TABS: ORAL_TABLET | ORAL | 0 refills | Status: DC

## 2018-07-24 MED ORDER — UMECLIDINIUM-VILANTEROL 62.5-25 MCG/INH IN AEPB: INHALATION_SPRAY | RESPIRATORY_TRACT | 6 refills | Status: DC

## 2018-07-24 NOTE — Progress Notes (Signed)
Virtual Visit via Video Note  I connected with pt on 07/24/18 at  2:00 PM EDT by a video enabled telemedicine application and verified that I am speaking with the correct person using two identifiers.  Location patient: home Location provider:work or home office Persons participating in the virtual visit: patient, provider  I discussed the limitations of evaluation and management by telemedicine and the availability of in person appointments. The patient expressed understanding and agreed to proceed.  Telemedicine visit is a necessity given the COVID-19 restrictions in place at the current time.  HPI: [name] being seen today for 1 mo f/u COPD and chronic/recurrent allergic +/- infectious rhinosinusitis. Of note, I referred her to pulm about 2 and 1/2 mo ago and due to covid 19 crisis it has not happened yet. I rx'd 5d course of prednisone last f/u visit for ongoing rhinosinusitis (no abx rx'd) and mild copd exacerbation.  Interim hx: She was feeling signif improvement until a few days ago when she woke up with ST and hoarseness. ST lasted all day then went away.  Hoarseness persists, and she also started a mildly productive cough and wheezing with these sx's.  Using albut neb a few times a day.  Mild PND and sinus pressure but not that bad per pt.  No purulent nasal mucous. Some frontal, bitemporal HAs lately. No fever.  Robitussin DM not helpful.  Tylenol ST formula a little helpful.   No GERD sx's. Last visit we got her on 5mg  prednisone qd. Still smoking over 1/2 pack cigs per day. No known recent sick contacts.  She is staying at home all the time due to covid 19 restrictions.  ROS: See pertinent positives and negatives per HPI.  Past Medical History:  Diagnosis Date  . Arthritis   . Bipolar 1 disorder ([place])   . Chronic pain syndrome    chronic low back pain (Pain mgmt= Dr. [name]).  MRI showed no signif disc dz, only showed some facet arthropathy at L4-5, with joint diastasis  at L4-5--plan as of 11/07/16 neurosurg eval is bilat facet injections.  . Colitis due to Clostridium difficile 2001  . COPD (chronic obstructive pulmonary disease) ([place])    PFTs 04/2016 showed no sign of copd or asthma, plus she actually had worse FEV1 after taking albuterol.  However, as of 06/2018 she has recent hx of improvement in sx's after taking albuterol.  LONG TIME SMOKER, won't quit.  Intolerant to or cannot afford: pulmicort, QVAR, advair, symbicort, flovent, bevespi, and spiriva (as of 06/2018).  . Cyst of left kidney 2016   Initially noted at 3 cm size, simple appearing, on CT abd/pelv in 2016.  On MRI L spine w and w/out contrast it was larger, lobulated--->ultrasound was done for f/u and it was 4.1 cm w/out worrisome features.  Marland Kitchen GAD (generalized anxiety disorder)   . GERD (gastroesophageal reflux disease)   . Hepatic hemangioma   . Hepatic steatosis 06/2016   Noted incidentally on CT chest  . History of Salmonella gastroenteritis   . Hyperlipidemia    a. Noted 02/2014.  Marland Kitchen Hypertension    HCTZ started 07/2015  . IBS (irritable bowel syndrome)   . Iron deficiency anemia 05/2014   per pt it is not due to vaginal blood loss; hemoccults sent to pt in mail 413/16.  . Lumbar spondylosis    mild, mainly focused at L4-5 and L5-S1.  MRI L spine 09/09/17-->mild degenerative disc disease and facet arthrosis without any nerve root encroachment,  no spinal stenosis.  . Microscopic hematuria    Intermittent (no w/u done yet, as of 03/2014)  . Migraine headache   . Morbid obesity ([place])   . Multiple lipomas 09/2016   left upper arm--very small ones.  . Nocturnal hypoxia 09/18/2017   Overnight oximetry -->qualified: ordered 2L oxygen during sleep.  . Obesity hypoventilation syndrome ([place])   . Pain in both lower legs 2018-2019   pain and numbness bilat LLs.  NCS/EMGs findings suggestive of S1 radiculopathy bilat: sx's + NCS findings suggestive of neurogenic claudication/spinal stenosis.  ?  contribution of venous reflux?  . Peripheral edema Fall 2018   R>L, non-pitting for the most part--venous doppler neg for DVT 01/2017.  Saw Dr. Bridgett Larsson (Vasc) 04/12/17 and u/s showed some venous reflux dz but he felt her sx's in legs were NOT due to her venous insufficiency OR PAD.  Thigh high comp stockings rx'd.  . Polycystic ovarian syndrome   . Pruritic dermatitis 2015/2016   +Scabies prep at [facility] 07/2014. (Primarily pruritic skin, but eventually a subtle rash as well)  . Raynaud's phenomenon 2019  . Rectal cancer ([place])    a. Followed by Dr. Gala Romney, dx 2000-2001. Tumor removed from rectal.  . Recurrent sinusitis    +allergic rhinitis  . Right shoulder pain 2017   Murphy/Wainer: RC bursitis + RC tear (MRI)--injection trial is plan per pt report 05/04/16.  . Tobacco dependence   . Vitamin D deficiency     Past Surgical History:  Procedure Laterality Date  . ABI's complete Bilateral 09/30/2017   Normal ABIs.  Great toe pressures adequate for wound healing.  Femoral artery waveforms triphasic (normal).  . CARDIOVASCULAR STRESS TEST  03/24/14   Lexscan MIBI: mild anterior ischemia?  Cardiac CT angiogram recommended/done.  . CHOLECYSTECTOMY    . COLONOSCOPY  05/05/2007   adenoma  . coronary CT angio  03/29/14   Two vessel dz/moderate stenosis of mid LAD and proximal RCA; cath recommended.  Marland Kitchen DILATION AND CURETTAGE OF UTERUS     for vaginal bleeding  . JOINT REPLACEMENT    . LEFT HEART CATHETERIZATION WITH CORONARY ANGIOGRAM N/A 03/31/2014   Normal coronaries, EF 76-19%, diastolic dysfunction.  Procedure: LEFT HEART CATHETERIZATION WITH CORONARY ANGIOGRAM;  Surgeon: Sinclair Grooms, MD;  Location[facility] C[facility] LAB;  Service: Cardiovascular;  Laterality: N/A;  . LOWER EXT VENOUS DOPPLERS Bilateral 02/08/2017   NEG for DVT (bilat)  . NCS/EMG  10/03/2017   S1 radiculopathy bilat  . Overnight oximetry testing  09/2017   qualified for oxygen supplementation during sleep  . PFTs  04/2016    No sign of COPD or asthma; FEV 1 worse after albut.  . Situ removed  age 18   High-grade rectal adenoma removed from rectum   . TRANSTHORACIC ECHOCARDIOGRAM  03/23/14; 10/21/17   2015 Normal.  2019: mild LVH, EF 65-70%, study technically inadequate to assess diastolic function, valves were fine, wall motion normal.    Family History  Problem Relation Age of Onset  . Colon cancer Father 76  . Cancer Father   . Diverticulitis Mother   . Heart disease Mother   . Hypertension Mother   . Hyperlipidemia Mother   . Mental illness Mother   . Diabetes Mother   . Heart attack Mother   . Stroke Neg Hx      Current Outpatient Medications:  .  albuterol (PROVENTIL HFA) 108 (90 Base) MCG/ACT inhaler, Inhale 1-2 puffs into the lungs every 4 (four)  hours as needed., Disp: 1 Inhaler, Rfl: 3 .  albuterol (PROVENTIL) (2.5 MG/3ML) 0.083% nebulizer solution, Take 3 mLs (2.5 mg total) by nebulization every 6 (six) hours as needed for wheezing or shortness of breath., Disp: 75 mL, Rfl: 6 .  AMITIZA 24 MCG capsule, Take 24 mcg by mouth 2 (two) times daily., Disp: , Rfl: 1 .  aspirin 81 MG chewable tablet, Chew 1 tablet (81 mg total) by mouth daily., Disp: 14 tablet, Rfl: 0 .  azelastine (ASTELIN) 0.1 % nasal spray, Place 2 sprays into both nostrils 2 (two) times daily., Disp: 30 mL, Rfl: 6 .  busPIRone (BUSPAR) 15 MG tablet, 1 in the morning and 2 at bedtime, Disp: , Rfl:  .  cetirizine (ZYRTEC) 10 MG tablet, TAKE 1 TABLET BY MOUTH ONCE DAILY, Disp: 30 tablet, Rfl: 11 .  clonazePAM (KLONOPIN) 0.5 MG tablet, TAKE 1 TABLET BY MOUTH DURING THE DAY AS NEEDED AND 2 AT BEDTIME, Disp: 90 tablet, Rfl: 5 .  cyclobenzaprine (FLEXERIL) 10 MG tablet, TAKE ONE TABLET BY MOUTH EVERY 6 HOURS AS NEEDED (SPASM RELATED TO HEADACHES) (Patient taking differently: Take 10 mg by mouth 3 (three) times daily. TAKE ONE TABLET BY MOUTH EVERY 6 HOURS AS NEEDED (SPASM RELATED TO HEADACHES)), Disp: 42 tablet, Rfl: 5 .  diphenhydrAMINE  (BENADRYL) 25 MG tablet, Take 1 tablet (25 mg total) by mouth every 6 (six) hours. (Patient taking differently: Take 25-50 mg by mouth every 6 (six) hours as needed for itching or allergies. ), Disp: 20 tablet, Rfl: 0 .  fluticasone (FLONASE) 50 MCG/ACT nasal spray, Place 2 sprays into both nostrils daily., Disp: 16 g, Rfl: 1 .  hydrOXYzine (ATARAX/VISTARIL) 25 MG tablet, Take 25 mg by mouth 3 (three) times daily. , Disp: , Rfl:  .  IRON-FOLIC ACID PO, Take 65 mg by mouth daily. , Disp: , Rfl:  .  lamoTRIgine (LAMICTAL) 100 MG tablet, Take 200 mg by mouth at bedtime., Disp: , Rfl:  .  LATUDA 40 MG TABS tablet, TAKE 1 TABLET BY MOUTH ONCE DAILY IN THE EVENING WITH FOOD (AT LEAST 350 CALORIES), Disp: , Rfl: 1 .  losartan (COZAAR) 50 MG tablet, Take 1 tablet (50 mg total) by mouth daily., Disp: 90 tablet, Rfl: 1 .  LYRICA 150 MG capsule, Take 1 tablet by mouth 3 (three) times daily., Disp: , Rfl: 2 .  Magnesium-Potassium 40-40 MG CAPS, Take 1 capsule by mouth daily., Disp: , Rfl:  .  meloxicam (MOBIC) 15 MG tablet, Take 15 mg by mouth daily., Disp: , Rfl: 1 .  metoprolol tartrate (LOPRESSOR) 50 MG tablet, Take 1 tablet (50 mg total) by mouth 2 (two) times daily., Disp: 180 tablet, Rfl: 3 .  Naldemedine Tosylate (SYMPROIC) 0.2 MG TABS, Take 1 tablet by mouth daily., Disp: , Rfl:  .  nitroGLYCERIN (NITROSTAT) 0.4 MG SL tablet, Place 1 tablet (0.4 mg total) under the tongue every 5 (five) minutes as needed for chest pain., Disp: 20 tablet, Rfl: 0 .  oxyCODONE-acetaminophen (PERCOCET) 10-325 MG tablet, Take 1 tablet by mouth 3 (three) times daily. , Disp: , Rfl:  .  pantoprazole (PROTONIX) 40 MG tablet, Take 1 tablet (40 mg total) by mouth 2 (two) times daily., Disp: 60 tablet, Rfl: 1 .  predniSONE (DELTASONE) 5 MG tablet, Take 1 tablet (5 mg total) by mouth daily with breakfast., Disp: 30 tablet, Rfl: 0 .  pregabalin (LYRICA) 200 MG capsule, Take 200 mg by mouth at bedtime., Disp: , Rfl:  .  promethazine  (PHENERGAN) 25 MG tablet, TAKE 1 TABLET BY MOUTH EVERY 6 HOURS AS NEEDED FOR NAUSEA AND VOMITING, Disp: 30 tablet, Rfl: 3 .  rOPINIRole (REQUIP) 0.25 MG tablet, TAKE 3 TABLETS BY MOUTH AT BEDTIME, Disp: 270 tablet, Rfl: 1 .  amoxicillin-clavulanate (AUGMENTIN) 875-125 MG tablet, Take 1 tablet by mouth 2 (two) times daily., Disp: 20 tablet, Rfl: 0 .  fluconazole (DIFLUCAN) 150 MG tablet, 1 tab po qd x 1 dose., Disp: 1 tablet, Rfl: 1 .  predniSONE (DELTASONE) 10 MG tablet, 2 tabs po qd x 3d, then 1 tab po qd x 3d, then resume your usual daily dose of 5 mg, Disp: 9 tablet, Rfl: 0 .  umeclidinium-vilanterol (ANORO ELLIPTA) 62.5-25 MCG/INH AEPB, 1 puff once a day (Take EVERY DAY)60, Disp: 60 each, Rfl: 6  EXAM:  VITALS per patient if applicable:  GENERAL: alert, oriented, appears well and in no acute distress  HEENT: atraumatic, conjunttiva clear, no obvious abnormalities on inspection of external nose and ears  NECK: normal movements of the head and neck  LUNGS: on inspection no signs of respiratory distress, breathing rate appears normal, no obvious gross SOB, gasping or wheezing  CV: no obvious cyanosis  MS: moves all visible extremities without noticeable abnormality  PSYCH/NEURO: pleasant and cooperative, no obvious depression or anxiety, speech and thought processing grossly intact  LABS: none today    Chemistry      Component Value Date/Time   NA 136 05/29/2017 1638   K 4.2 05/29/2017 1638   CL 101 05/29/2017 1638   CO2 22 05/29/2017 1638   BUN 11 05/29/2017 1638   CREATININE 0.71 05/29/2017 1638      Component Value Date/Time   CALCIUM 9.6 05/29/2017 1638   ALKPHOS 71 05/29/2017 1638   AST 21 05/29/2017 1638   ALT 17 05/29/2017 1638   BILITOT 0.9 05/29/2017 1638     Lab Results  Component Value Date   WBC 11.9 (H) 01/30/2018   HGB 14.5 01/30/2018   HCT 42.6 01/30/2018   MCV 88.2 01/30/2018   PLT 368.0 01/30/2018   Lab Results  Component Value Date   HGBA1C  5.9 (H) 03/23/2014    ASSESSMENT AND PLAN:  Discussed the following assessment and plan:   1) Recurrent allergic + infectious rhinosinusitis + acute exac of COPD--> in the setting of morbid obesity with obesity hypoventilation syndrome and ongoing tobacco abuse. No sign of sinusitis on relatively recent plain films. She is on home oxygen nocturnally and prn daytime.   She has been on 5mg  prednisone daily since finishing most recent steroid burst about 1 mo ago.  (Of note, she has never had a sleep study, but denies sx's/signs of OSA.  ?stop toprol and start CCB? ? verap ineffective?-->recommended change to diltiazem trial LAST f/u visit but pt declined). She has either not tolerated or has been unable to afford MANY controller inhalers, including QVAR, flovent, symbicort, advair, pulmicort, bevespi, and spiriva.. Will try to get her on anoro ellipta one inhalation daily. She has albut neb soln and HFA to use qid prn. No abx indicated at this time. Continue astelin, zyrtec, and flonase. To get pulm eval in near future when some of the tighter covid 19 restrictions are lifted.  2) Tobacco dependence: Ongoing, despite repeated recommendations to stop smoking. She admits that she holds onto this as an emotional crutch. Pt states chantix will cost her 500 mg per month. Nicotine patches no help in the past. Cold [place] not an  option per pt.   3) Current chronic use of systemic steroids: she has not only had MANY courses of prednisone in the past, but she has also been on daily low dose prednisone for the last 3-4 wks and we have extended this today. Will need to get updated HbA1c first chance we get in office and she'll need a DEXA soon as well. We have discussed risk/benefit of long term steroid use again and she is agreeable to continuing this med at this time.  I discussed the assessment and treatment plan with the patient. The patient was provided an opportunity to ask questions and all  were answered. The patient agreed with the plan and demonstrated an understanding of the instructions.   The patient was advised to call back or seek an in-person evaluation if the symptoms worsen or if the condition fails to improve as anticipated.  F/u: 1 mo  Signed:  Crissie Sickles, MD           07/24/2018

## 2018-07-26 ENCOUNTER — Other Ambulatory Visit: Payer: Self-pay | Admitting: Family Medicine

## 2018-07-28 NOTE — Telephone Encounter (Signed)
RF request for Prednisone 5mg  LOV:07/24/18 Next ov:  08/27/18 Last written: 06/25/18 (30,0)  Please advise, thanks.

## 2018-08-11 ENCOUNTER — Ambulatory Visit (HOSPITAL_COMMUNITY)
Admission: RE | Admit: 2018-08-11 | Discharge: 2018-08-11 | Disposition: A | Discharge status: Home / Self Care | Payer: Commercial Managed Care - PPO | Source: Ambulatory Visit | Attending: Neurology | Admitting: Neurology

## 2018-08-11 ENCOUNTER — Other Ambulatory Visit (HOSPITAL_COMMUNITY): Payer: Self-pay | Admitting: Neurology

## 2018-08-11 ENCOUNTER — Other Ambulatory Visit: Payer: Self-pay

## 2018-08-11 DIAGNOSIS — M5440 Lumbago with sciatica, unspecified side: Secondary | ICD-10-CM | POA: Diagnosis not present

## 2018-08-11 DIAGNOSIS — M5114 Intervertebral disc disorders with radiculopathy, thoracic region: Secondary | ICD-10-CM | POA: Insufficient documentation

## 2018-08-11 DIAGNOSIS — M25559 Pain in unspecified hip: Secondary | ICD-10-CM | POA: Insufficient documentation

## 2018-08-11 DIAGNOSIS — M549 Dorsalgia, unspecified: Secondary | ICD-10-CM | POA: Diagnosis not present

## 2018-08-11 DIAGNOSIS — M25551 Pain in right hip: Secondary | ICD-10-CM | POA: Diagnosis not present

## 2018-08-11 DIAGNOSIS — M545 Low back pain: Secondary | ICD-10-CM | POA: Diagnosis not present

## 2018-08-13 DIAGNOSIS — M797 Fibromyalgia: Secondary | ICD-10-CM | POA: Diagnosis not present

## 2018-08-13 DIAGNOSIS — R252 Cramp and spasm: Secondary | ICD-10-CM | POA: Diagnosis not present

## 2018-08-13 DIAGNOSIS — G603 Idiopathic progressive neuropathy: Secondary | ICD-10-CM | POA: Diagnosis not present

## 2018-08-13 DIAGNOSIS — M79606 Pain in leg, unspecified: Secondary | ICD-10-CM | POA: Diagnosis not present

## 2018-08-18 DIAGNOSIS — R0902 Hypoxemia: Secondary | ICD-10-CM | POA: Diagnosis not present

## 2018-08-18 DIAGNOSIS — J441 Chronic obstructive pulmonary disease with (acute) exacerbation: Secondary | ICD-10-CM | POA: Diagnosis not present

## 2018-08-18 DIAGNOSIS — J449 Chronic obstructive pulmonary disease, unspecified: Secondary | ICD-10-CM | POA: Diagnosis not present

## 2018-08-20 ENCOUNTER — Other Ambulatory Visit: Payer: Self-pay

## 2018-08-20 ENCOUNTER — Encounter: Payer: Self-pay | Admitting: Family Medicine

## 2018-08-20 ENCOUNTER — Ambulatory Visit: Payer: Self-pay

## 2018-08-20 ENCOUNTER — Ambulatory Visit (INDEPENDENT_AMBULATORY_CARE_PROVIDER_SITE_OTHER): Payer: Commercial Managed Care - PPO | Admitting: Family Medicine

## 2018-08-20 VITALS — BP 115/72 | HR 83

## 2018-08-20 DIAGNOSIS — J449 Chronic obstructive pulmonary disease, unspecified: Secondary | ICD-10-CM | POA: Diagnosis not present

## 2018-08-20 DIAGNOSIS — H8110 Benign paroxysmal vertigo, unspecified ear: Secondary | ICD-10-CM

## 2018-08-20 DIAGNOSIS — F4310 Post-traumatic stress disorder, unspecified: Secondary | ICD-10-CM | POA: Diagnosis not present

## 2018-08-20 DIAGNOSIS — T887XXA Unspecified adverse effect of drug or medicament, initial encounter: Secondary | ICD-10-CM | POA: Diagnosis not present

## 2018-08-20 MED ORDER — PREDNISONE 5 MG PO TABS: ORAL_TABLET | ORAL | 1 refills | Status: DC

## 2018-08-20 MED ORDER — PREDNISONE 10 MG PO TABS: ORAL_TABLET | ORAL | 0 refills | Status: DC

## 2018-08-20 NOTE — Telephone Encounter (Signed)
Noted  

## 2018-08-20 NOTE — Telephone Encounter (Signed)
Pt called stating that she has vertigo every time she lays her head back.  She states that the room moves and even when sitting she feels like she is going to fall.  She states that it makes her feel nauseous. No vomiting.  She has one episode in the past of vertigo but was never evaluated. The episode was brief. Her BP today 121/85 O2 saturation 92%.  She uses home O2. She states that she had left ear pain 2 days ago and treated with some drops she had on hand.  She denies pain today. No drainage. She states only some ear wax from the ear. She states she feels weak but has not fainted. She has no numbness or weakness on one side of her body. She has never had a stroke. Per protocol call was transferred to office for appointment. E-mail verified.  Reason for Disposition . [1] MODERATE dizziness (e.g., vertigo; feels very unsteady, interferes with normal activities) AND [2] has NOT been evaluated by physician for this  Answer Assessment - Initial Assessment Questions 1. DESCRIPTION: "Describe your dizziness."     Room is moving 2. VERTIGO: "Do you feel like either you or the room is spinning or tilting?"      Room moving 3. LIGHTHEADED: "Do you feel lightheaded?" (e.g., somewhat faint, woozy, weak upon standing)     Unsure mainly when she lays head back 4. SEVERITY: "How bad is it?"  "Can you walk?"   - MILD - Feels unsteady but walking normally.   - MODERATE - Feels very unsteady when walking, but not falling; interferes with normal activities (e.g., school, work) .   - SEVERE - Unable to walk without falling (requires assistance).     moderate 5. ONSET:  "When did the dizziness begin?"     2 weeks ago 6. AGGRAVATING FACTORS: "Does anything make it worse?" (e.g., standing, change in head position)    Laying head back 7. CAUSE: "What do you think is causing the dizziness?"     unsure 8. RECURRENT SYMPTOM: "Have you had dizziness before?" If so, ask: "When was the last time?" "What happened that  time?"     Yes  Once but did not last 9. OTHER SYMPTOMS: "Do you have any other symptoms?" (e.g., headache, weakness, numbness, vomiting, earache)     Vomiting, weak, earache a few days ago 10. PREGNANCY: "Is there any chance you are pregnant?" "When was your last menstrual period?"       No 2 this month  Protocols used: DIZZINESS - VERTIGO-A-AH

## 2018-08-20 NOTE — Progress Notes (Signed)
Virtual Visit via Video Note  I connected with pt on 08/20/18 at  1:40 PM EDT by a video enabled telemedicine application and verified that I am speaking with the correct person using two identifiers.  Location patient: home Location provider:work or home office Persons participating in the virtual visit: patient, provider  I discussed the limitations of evaluation and management by telemedicine and the availability of in person appointments. The patient expressed understanding and agreed to proceed.  Telemedicine visit is a necessity given the COVID-19 restrictions in place at the current time.  HPI: [name] being seen today for "vertigo". Last 2 wks having brief episodes of vertigo.  Describes sensation of room spinning and "eyes moving in the direction of spinning".  Very nauseated with it.  No fever.  No vomiting. The last 2 days they have been a bit more severe and prolonged.  Lasts 30 sec or less. It starts when she bends her head down, like when she was bending her head down to wash hair in the tub.  +Nausea.   Some mild lightheaded feeling in between episodes the last couple of days.  Also, she was noticing some improvement on anoro ellipta started recently, but noted it was causing painful "blisters" in her mouth.  She stopped it and the lesions resolved.  ROS: no focal weakness, no paresthesias, no swallowing or speech changes.  No diarrhea.  Past Medical History:  Diagnosis Date  . Arthritis   . Bipolar 1 disorder ([place])   . Chronic pain syndrome    chronic low back pain (Pain mgmt= Dr. [name]).  MRI showed no signif disc dz, only showed some facet arthropathy at L4-5, with joint diastasis at L4-5--plan as of 11/07/16 neurosurg eval is bilat facet injections.  . Colitis due to Clostridium difficile 2001  . COPD (chronic obstructive pulmonary disease) ([place])    PFTs 04/2016 showed no sign of copd or asthma, plus she actually had worse FEV1 after taking albuterol.  However, as  of 06/2018 she has recent hx of improvement in sx's after taking albuterol.  LONG TIME SMOKER, won't quit.  Intolerant to or cannot afford: pulmicort, QVAR, advair, symbicort, flovent, bevespi, and spiriva (as of 06/2018).  . Cyst of left kidney 2016   Initially noted at 3 cm size, simple appearing, on CT abd/pelv in 2016.  On MRI L spine w and w/out contrast it was larger, lobulated--->ultrasound was done for f/u and it was 4.1 cm w/out worrisome features.  Marland Kitchen GAD (generalized anxiety disorder)   . GERD (gastroesophageal reflux disease)   . Hepatic hemangioma   . Hepatic steatosis 06/2016   Noted incidentally on CT chest  . History of Salmonella gastroenteritis   . Hyperlipidemia    a. Noted 02/2014.  Marland Kitchen Hypertension    HCTZ started 07/2015  . IBS (irritable bowel syndrome)   . Iron deficiency anemia 05/2014   per pt it is not due to vaginal blood loss; hemoccults sent to pt in mail 413/16.  . Lumbar spondylosis    mild, mainly focused at L4-5 and L5-S1.  MRI L spine 09/09/17-->mild degenerative disc disease and facet arthrosis without any nerve root encroachment, no spinal stenosis.  . Microscopic hematuria    Intermittent (no w/u done yet, as of 03/2014)  . Migraine headache   . Morbid obesity ([place])   . Multiple lipomas 09/2016   left upper arm--very small ones.  . Nocturnal hypoxia 09/18/2017   Overnight oximetry -->qualified: ordered 2L oxygen during sleep.  Marland Kitchen  Obesity hypoventilation syndrome ([place])   . Pain in both lower legs 2018-2019   pain and numbness bilat LLs.  NCS/EMGs findings suggestive of S1 radiculopathy bilat: sx's + NCS findings suggestive of neurogenic claudication/spinal stenosis.  ? contribution of venous reflux?  . Peripheral edema Fall 2018   R>L, non-pitting for the most part--venous doppler neg for DVT 01/2017.  Saw Dr. Bridgett Larsson (Vasc) 04/12/17 and u/s showed some venous reflux dz but he felt her sx's in legs were NOT due to her venous insufficiency OR PAD.  Thigh high comp  stockings rx'd.  . Polycystic ovarian syndrome   . Pruritic dermatitis 2015/2016   +Scabies prep at [facility] 07/2014. (Primarily pruritic skin, but eventually a subtle rash as well)  . Raynaud's phenomenon 2019  . Rectal cancer ([place])    a. Followed by Dr. Gala Romney, dx 2000-2001. Tumor removed from rectal.  . Recurrent sinusitis    +allergic rhinitis  . Right shoulder pain 2017   Murphy/Wainer: RC bursitis + RC tear (MRI)--injection trial is plan per pt report 05/04/16.  . Tobacco dependence   . Vitamin D deficiency     Past Surgical History:  Procedure Laterality Date  . ABI's complete Bilateral 09/30/2017   Normal ABIs.  Great toe pressures adequate for wound healing.  Femoral artery waveforms triphasic (normal).  . CARDIOVASCULAR STRESS TEST  03/24/14   Lexscan MIBI: mild anterior ischemia?  Cardiac CT angiogram recommended/done.  . CHOLECYSTECTOMY    . COLONOSCOPY  05/05/2007   adenoma  . coronary CT angio  03/29/14   Two vessel dz/moderate stenosis of mid LAD and proximal RCA; cath recommended.  Marland Kitchen DILATION AND CURETTAGE OF UTERUS     for vaginal bleeding  . JOINT REPLACEMENT    . LEFT HEART CATHETERIZATION WITH CORONARY ANGIOGRAM N/A 03/31/2014   Normal coronaries, EF 27-03%, diastolic dysfunction.  Procedure: LEFT HEART CATHETERIZATION WITH CORONARY ANGIOGRAM;  Surgeon: Sinclair Grooms, MD;  Location[facility] CATH LAB;  Service: Cardiovascular;  Laterality: N/A;  . LOWER EXT VENOUS DOPPLERS Bilateral 02/08/2017   NEG for DVT (bilat)  . NCS/EMG  10/03/2017   S1 radiculopathy bilat  . Overnight oximetry testing  09/2017   qualified for oxygen supplementation during sleep  . PFTs  04/2016   No sign of COPD or asthma; FEV 1 worse after albut.  . Situ removed  age 13   High-grade rectal adenoma removed from rectum   . TRANSTHORACIC ECHOCARDIOGRAM  03/23/14; 10/21/17   2015 Normal.  2019: mild LVH, EF 65-70%, study technically inadequate to assess diastolic function, valves were  fine, wall motion normal.    Family History  Problem Relation Age of Onset  . Colon cancer Father 52  . Cancer Father   . Diverticulitis Mother   . Heart disease Mother   . Hypertension Mother   . Hyperlipidemia Mother   . Mental illness Mother   . Diabetes Mother   . Heart attack Mother   . Stroke Neg Hx      Current Outpatient Medications:  .  albuterol (PROVENTIL HFA) 108 (90 Base) MCG/ACT inhaler, Inhale 1-2 puffs into the lungs every 4 (four) hours as needed., Disp: 1 Inhaler, Rfl: 3 .  AMITIZA 24 MCG capsule, Take 24 mcg by mouth 2 (two) times daily., Disp: , Rfl: 1 .  aspirin 81 MG chewable tablet, Chew 1 tablet (81 mg total) by mouth daily., Disp: 14 tablet, Rfl: 0 .  azelastine (ASTELIN) 0.1 % nasal spray, Place 2  sprays into both nostrils 2 (two) times daily., Disp: 30 mL, Rfl: 6 .  busPIRone (BUSPAR) 15 MG tablet, 1 in the morning and 2 at bedtime, Disp: , Rfl:  .  cetirizine (ZYRTEC) 10 MG tablet, TAKE 1 TABLET BY MOUTH ONCE DAILY, Disp: 30 tablet, Rfl: 11 .  clonazePAM (KLONOPIN) 0.5 MG tablet, TAKE 1 TABLET BY MOUTH DURING THE DAY AS NEEDED AND 2 AT BEDTIME, Disp: 90 tablet, Rfl: 5 .  cyclobenzaprine (FLEXERIL) 10 MG tablet, TAKE ONE TABLET BY MOUTH EVERY 6 HOURS AS NEEDED (SPASM RELATED TO HEADACHES) (Patient taking differently: Take 10 mg by mouth 3 (three) times daily. TAKE ONE TABLET BY MOUTH EVERY 6 HOURS AS NEEDED (SPASM RELATED TO HEADACHES)), Disp: 42 tablet, Rfl: 5 .  diphenhydrAMINE (BENADRYL) 25 MG tablet, Take 1 tablet (25 mg total) by mouth every 6 (six) hours. (Patient taking differently: Take 25-50 mg by mouth every 6 (six) hours as needed for itching or allergies. ), Disp: 20 tablet, Rfl: 0 .  fluticasone (FLONASE) 50 MCG/ACT nasal spray, Place 2 sprays into both nostrils daily., Disp: 16 g, Rfl: 1 .  hydrOXYzine (ATARAX/VISTARIL) 25 MG tablet, Take 25 mg by mouth 3 (three) times daily. , Disp: , Rfl:  .  IRON-FOLIC ACID PO, Take 65 mg by mouth daily. ,  Disp: , Rfl:  .  lamoTRIgine (LAMICTAL) 100 MG tablet, Take 200 mg by mouth at bedtime., Disp: , Rfl:  .  LATUDA 40 MG TABS tablet, TAKE 1 TABLET BY MOUTH ONCE DAILY IN THE EVENING WITH FOOD (AT LEAST 350 CALORIES), Disp: , Rfl: 1 .  losartan (COZAAR) 50 MG tablet, Take 1 tablet (50 mg total) by mouth daily., Disp: 90 tablet, Rfl: 1 .  LYRICA 150 MG capsule, Take 1 tablet by mouth 3 (three) times daily., Disp: , Rfl: 2 .  Magnesium-Potassium 40-40 MG CAPS, Take 1 capsule by mouth daily., Disp: , Rfl:  .  meloxicam (MOBIC) 15 MG tablet, Take 15 mg by mouth daily., Disp: , Rfl: 1 .  metoprolol tartrate (LOPRESSOR) 50 MG tablet, Take 1 tablet (50 mg total) by mouth 2 (two) times daily., Disp: 180 tablet, Rfl: 3 .  Naldemedine Tosylate (SYMPROIC) 0.2 MG TABS, Take 1 tablet by mouth daily., Disp: , Rfl:  .  nitroGLYCERIN (NITROSTAT) 0.4 MG SL tablet, Place 1 tablet (0.4 mg total) under the tongue every 5 (five) minutes as needed for chest pain., Disp: 20 tablet, Rfl: 0 .  oxyCODONE-acetaminophen (PERCOCET) 10-325 MG tablet, Take 1 tablet by mouth 3 (three) times daily. , Disp: , Rfl:  .  pantoprazole (PROTONIX) 40 MG tablet, Take 1 tablet (40 mg total) by mouth 2 (two) times daily., Disp: 60 tablet, Rfl: 1 .  pregabalin (LYRICA) 200 MG capsule, Take 200 mg by mouth at bedtime., Disp: , Rfl:  .  promethazine (PHENERGAN) 25 MG tablet, TAKE 1 TABLET BY MOUTH EVERY 6 HOURS AS NEEDED FOR NAUSEA AND VOMITING, Disp: 30 tablet, Rfl: 3 .  rOPINIRole (REQUIP) 0.25 MG tablet, TAKE 3 TABLETS BY MOUTH AT BEDTIME, Disp: 270 tablet, Rfl: 1 .  albuterol (PROVENTIL) (2.5 MG/3ML) 0.083% nebulizer solution, Take 3 mLs (2.5 mg total) by nebulization every 6 (six) hours as needed for wheezing or shortness of breath., Disp: 75 mL, Rfl: 6 .  umeclidinium-vilanterol (ANORO ELLIPTA) 62.5-25 MCG/INH AEPB, 1 puff once a day (Take EVERY DAY)60 (Patient not taking: Reported on 08/20/2018), Disp: 60 each, Rfl: 6  EXAM:  VITALS per  patient if applicable:  GENERAL: alert, oriented, appears well and in no acute distress  HEENT: atraumatic, conjunttiva clear, no obvious abnormalities on inspection of external nose and ears  NECK: normal movements of the head and neck  LUNGS: on inspection no signs of respiratory distress, breathing rate appears normal, no obvious gross SOB, gasping or wheezing  CV: no obvious cyanosis  MS: moves all visible extremities without noticeable abnormality  PSYCH/NEURO: pleasant and cooperative, no obvious depression or anxiety, speech and thought processing grossly intact  LABS: none today    Chemistry      Component Value Date/Time   NA 136 05/29/2017 1638   K 4.2 05/29/2017 1638   CL 101 05/29/2017 1638   CO2 22 05/29/2017 1638   BUN 11 05/29/2017 1638   CREATININE 0.71 05/29/2017 1638      Component Value Date/Time   CALCIUM 9.6 05/29/2017 1638   ALKPHOS 71 05/29/2017 1638   AST 21 05/29/2017 1638   ALT 17 05/29/2017 1638   BILITOT 0.9 05/29/2017 1638     Lab Results  Component Value Date   WBC 11.9 (H) 01/30/2018   HGB 14.5 01/30/2018   HCT 42.6 01/30/2018   MCV 88.2 01/30/2018   PLT 368.0 01/30/2018   Lab Results  Component Value Date   TSH 1.12 10/08/2016    ASSESSMENT AND PLAN:  Discussed the following assessment and plan:  1) BPPV: discussed home Epley maneuvers.   Will get these e-mailed or snail mailed to her. Signs/symptoms to call or return for were reviewed and pt expressed understanding.  2) COPD: was improved on anoro Ellipta but had mouth ulcers blisters with use of this med. She stopped the med and the oral lesions resolved. Will try stiolto respimat (tiotropim + olodaterol) 2 puffs qd. I advised her to go ahead and call pulm and try to arrange a visit now that covid 19 restrictions are letting up a little.  I discussed the assessment and treatment plan with the patient. The patient was provided an opportunity to ask questions and all were  answered. The patient agreed with the plan and demonstrated an understanding of the instructions.   The patient was advised to call back or seek an in-person evaluation if the symptoms worsen or if the condition fails to improve as anticipated.  F/u: prn  Signed:  Crissie Sickles, MD           08/20/2018

## 2018-08-25 MED ORDER — TIOTROPIUM BROMIDE-OLODATEROL 2.5-2.5 MCG/ACT IN AERS: 2.0000 | INHALATION_SPRAY | Freq: Every day | RESPIRATORY_TRACT | 1 refills | Status: DC

## 2018-08-27 ENCOUNTER — Other Ambulatory Visit: Payer: Self-pay

## 2018-08-27 ENCOUNTER — Ambulatory Visit (INDEPENDENT_AMBULATORY_CARE_PROVIDER_SITE_OTHER): Payer: Commercial Managed Care - PPO | Admitting: Family Medicine

## 2018-08-27 ENCOUNTER — Encounter: Payer: Self-pay | Admitting: Family Medicine

## 2018-08-27 VITALS — BP 123/58 | HR 105

## 2018-08-27 DIAGNOSIS — R0602 Shortness of breath: Secondary | ICD-10-CM | POA: Diagnosis not present

## 2018-08-27 DIAGNOSIS — R05 Cough: Secondary | ICD-10-CM

## 2018-08-27 DIAGNOSIS — E7800 Pure hypercholesterolemia, unspecified: Secondary | ICD-10-CM | POA: Diagnosis not present

## 2018-08-27 DIAGNOSIS — I10 Essential (primary) hypertension: Secondary | ICD-10-CM

## 2018-08-27 DIAGNOSIS — R0600 Dyspnea, unspecified: Secondary | ICD-10-CM

## 2018-08-27 DIAGNOSIS — R0609 Other forms of dyspnea: Secondary | ICD-10-CM

## 2018-08-27 DIAGNOSIS — Z7952 Long term (current) use of systemic steroids: Secondary | ICD-10-CM

## 2018-08-27 DIAGNOSIS — R053 Chronic cough: Secondary | ICD-10-CM

## 2018-08-27 DIAGNOSIS — R7301 Impaired fasting glucose: Secondary | ICD-10-CM | POA: Diagnosis not present

## 2018-08-27 DIAGNOSIS — J42 Unspecified chronic bronchitis: Secondary | ICD-10-CM

## 2018-08-27 MED ORDER — ALBUTEROL SULFATE (2.5 MG/3ML) 0.083% IN NEBU: 2.5000 mg | INHALATION_SOLUTION | Freq: Four times a day (QID) | RESPIRATORY_TRACT | 6 refills | Status: AC | PRN

## 2018-08-27 NOTE — Progress Notes (Signed)
Virtual Visit via Video Note  I connected with pt on 08/27/18 at  1:00 PM EDT by a video enabled telemedicine application and verified that I am speaking with the correct person using two identifiers.  Location patient: home Location provider:work or home office Persons participating in the virtual visit: patient, provider  I discussed the limitations of evaluation and management by telemedicine and the availability of in person appointments. The patient expressed understanding and agreed to proceed.  Telemedicine visit is a necessity given the COVID-19 restrictions in place at the current time.  HPI: [name] being seen today for 1 mo f/u COPD. Still awaiting pt to reschedule her initial pulm consult visit. Has been on prednisone 5mg  qd now for 2 mo. Most recent controller therapy trial was anoro ellipta-->helped but caused oral "ulcers". I just started her on trial of stiolto respimat-->she'll pick this up today.  Since off anoro ellipta her SOB has returned.  Still has good days and bad days. Started using albut q4h yesterday.  Cough is increased but no change in mucous production. NO change in level of smoking.  No recent fevers. No hemoptysis.  DOE same. No CP.  She tells me of a "rash all over my entire body for the last 1 yr".  Doesn't itch.  Not palpable/raised. Small pinkish spots scattered everywhere except hands and feet.  The worst areas are chest and breasts. Nothing applied to this.  She has not seen a dermatologist for this. Past hx of scabies a few years ago but pt states it doesn't look like that.  Anxiety: stable on scheduled clonazepam dosing.   ROS: no CP, no HAs, no rashes, no melena/hematochezia.  No polyuria or polydipsia.  Chronic widespread myalgias and arthralgias. [facility] for chronic pain mgmt.  Past Medical History:  Diagnosis Date  . Arthritis   . Bipolar 1 disorder ([place])   . Chronic pain syndrome    chronic low back pain (Pain  mgmt= Dr. [name]).  MRI showed no signif disc dz, only showed some facet arthropathy at L4-5, with joint diastasis at L4-5--plan as of 11/07/16 neurosurg eval is bilat facet injections.  . Colitis due to Clostridium difficile 2001  . COPD (chronic obstructive pulmonary disease) ([place])    PFTs 04/2016 showed no sign of copd or asthma, plus she actually had worse FEV1 after taking albuterol.  However, as of 06/2018 she has recent hx of improvement in sx's after taking albuterol.  LONG TIME SMOKER, won't quit.  Intolerant to or cannot afford: pulmicort, QVAR, advair, symbicort, flovent, bevespi, and spiriva (as of 06/2018).  . Cyst of left kidney 2016   Initially noted at 3 cm size, simple appearing, on CT abd/pelv in 2016.  On MRI L spine w and w/out contrast it was larger, lobulated--->ultrasound was done for f/u and it was 4.1 cm w/out worrisome features.  Marland Kitchen GAD (generalized anxiety disorder)   . GERD (gastroesophageal reflux disease)   . Hepatic hemangioma   . Hepatic steatosis 06/2016   Noted incidentally on CT chest  . History of Salmonella gastroenteritis   . Hyperlipidemia    a. Noted 02/2014.  Marland Kitchen Hypertension    HCTZ started 07/2015  . IBS (irritable bowel syndrome)   . Iron deficiency anemia 05/2014   per pt it is not due to vaginal blood loss; hemoccults sent to pt in mail 413/16.  . Lumbar spondylosis    mild, mainly focused at L4-5 and L5-S1.  MRI L spine 09/09/17-->mild  degenerative disc disease and facet arthrosis without any nerve root encroachment, no spinal stenosis.  . Microscopic hematuria    Intermittent (no w/u done yet, as of 03/2014)  . Migraine headache   . Morbid obesity ([place])   . Multiple lipomas 09/2016   left upper arm--very small ones.  . Nocturnal hypoxia 09/18/2017   Overnight oximetry -->qualified: ordered 2L oxygen during sleep.  . Obesity hypoventilation syndrome ([place])   . Pain in both lower legs 2018-2019   pain and numbness bilat LLs.  NCS/EMGs findings  suggestive of S1 radiculopathy bilat: sx's + NCS findings suggestive of neurogenic claudication/spinal stenosis.  ? contribution of venous reflux?  . Peripheral edema Fall 2018   R>L, non-pitting for the most part--venous doppler neg for DVT 01/2017.  Saw Dr. Bridgett Larsson (Vasc) 04/12/17 and u/s showed some venous reflux dz but he felt her sx's in legs were NOT due to her venous insufficiency OR PAD.  Thigh high comp stockings rx'd.  . Polycystic ovarian syndrome   . Pruritic dermatitis 2015/2016   +Scabies prep at [facility] 07/2014. (Primarily pruritic skin, but eventually a subtle rash as well)  . Raynaud's phenomenon 2019  . Rectal cancer ([place])    a. Followed by Dr. Gala Romney, dx 2000-2001. Tumor removed from rectal.  . Recurrent sinusitis    +allergic rhinitis  . Right shoulder pain 2017   Murphy/Wainer: RC bursitis + RC tear (MRI)--injection trial is plan per pt report 05/04/16.  . Tobacco dependence   . Vitamin D deficiency     Past Surgical History:  Procedure Laterality Date  . ABI's complete Bilateral 09/30/2017   Normal ABIs.  Great toe pressures adequate for wound healing.  Femoral artery waveforms triphasic (normal).  . CARDIOVASCULAR STRESS TEST  03/24/14   Lexscan MIBI: mild anterior ischemia?  Cardiac CT angiogram recommended/done.  . CHOLECYSTECTOMY    . COLONOSCOPY  05/05/2007   adenoma  . coronary CT angio  03/29/14   Two vessel dz/moderate stenosis of mid LAD and proximal RCA; cath recommended.  Marland Kitchen DILATION AND CURETTAGE OF UTERUS     for vaginal bleeding  . JOINT REPLACEMENT    . LEFT HEART CATHETERIZATION WITH CORONARY ANGIOGRAM N/A 03/31/2014   Normal coronaries, EF 91-79%, diastolic dysfunction.  Procedure: LEFT HEART CATHETERIZATION WITH CORONARY ANGIOGRAM;  Surgeon: Sinclair Grooms, MD;  Location: [facility] CATH LAB;  Service: Cardiovascular;  Laterality: N/A;  . LOWER EXT VENOUS DOPPLERS Bilateral 02/08/2017   NEG for DVT (bilat)  . NCS/EMG  10/03/2017   S1 radiculopathy  bilat  . Overnight oximetry testing  09/2017   qualified for oxygen supplementation during sleep  . PFTs  04/2016   No sign of COPD or asthma; FEV 1 worse after albut.  . Situ removed  age 15   High-grade rectal adenoma removed from rectum   . TRANSTHORACIC ECHOCARDIOGRAM  03/23/14; 10/21/17   2015 Normal.  2019: mild LVH, EF 65-70%, study technically inadequate to assess diastolic function, valves were fine, wall motion normal.    Family History  Problem Relation Age of Onset  . Colon cancer Father 64  . Cancer Father   . Diverticulitis Mother   . Heart disease Mother   . Hypertension Mother   . Hyperlipidemia Mother   . Mental illness Mother   . Diabetes Mother   . Heart attack Mother   . Stroke Neg Hx      Current Outpatient Medications:  .  albuterol (PROVENTIL HFA) 108 (90 Base)  MCG/ACT inhaler, Inhale 1-2 puffs into the lungs every 4 (four) hours as needed., Disp: 1 Inhaler, Rfl: 3 .  albuterol (PROVENTIL) (2.5 MG/3ML) 0.083% nebulizer solution, Take 3 mLs (2.5 mg total) by nebulization every 6 (six) hours as needed for wheezing or shortness of breath., Disp: 75 mL, Rfl: 6 .  AMITIZA 24 MCG capsule, Take 24 mcg by mouth 2 (two) times daily., Disp: , Rfl: 1 .  aspirin 81 MG chewable tablet, Chew 1 tablet (81 mg total) by mouth daily., Disp: 14 tablet, Rfl: 0 .  azelastine (ASTELIN) 0.1 % nasal spray, Place 2 sprays into both nostrils 2 (two) times daily., Disp: 30 mL, Rfl: 6 .  busPIRone (BUSPAR) 15 MG tablet, 1 in the morning and 2 at bedtime, Disp: , Rfl:  .  cetirizine (ZYRTEC) 10 MG tablet, TAKE 1 TABLET BY MOUTH ONCE DAILY, Disp: 30 tablet, Rfl: 11 .  clonazePAM (KLONOPIN) 0.5 MG tablet, TAKE 1 TABLET BY MOUTH DURING THE DAY AS NEEDED AND 2 AT BEDTIME, Disp: 90 tablet, Rfl: 5 .  cyclobenzaprine (FLEXERIL) 10 MG tablet, TAKE ONE TABLET BY MOUTH EVERY 6 HOURS AS NEEDED (SPASM RELATED TO HEADACHES) (Patient taking differently: Take 10 mg by mouth 3 (three) times daily. TAKE  ONE TABLET BY MOUTH EVERY 6 HOURS AS NEEDED (SPASM RELATED TO HEADACHES)), Disp: 42 tablet, Rfl: 5 .  diphenhydrAMINE (BENADRYL) 25 MG tablet, Take 1 tablet (25 mg total) by mouth every 6 (six) hours. (Patient taking differently: Take 25-50 mg by mouth every 6 (six) hours as needed for itching or allergies. ), Disp: 20 tablet, Rfl: 0 .  fluticasone (FLONASE) 50 MCG/ACT nasal spray, Place 2 sprays into both nostrils daily., Disp: 16 g, Rfl: 1 .  hydrOXYzine (ATARAX/VISTARIL) 25 MG tablet, Take 25 mg by mouth 3 (three) times daily. , Disp: , Rfl:  .  IRON-FOLIC ACID PO, Take 65 mg by mouth daily. , Disp: , Rfl:  .  lamoTRIgine (LAMICTAL) 100 MG tablet, Take 200 mg by mouth at bedtime., Disp: , Rfl:  .  LATUDA 40 MG TABS tablet, TAKE 1 TABLET BY MOUTH ONCE DAILY IN THE EVENING WITH FOOD (AT LEAST 350 CALORIES), Disp: , Rfl: 1 .  losartan (COZAAR) 50 MG tablet, Take 1 tablet (50 mg total) by mouth daily., Disp: 90 tablet, Rfl: 1 .  LYRICA 150 MG capsule, Take 1 tablet by mouth 3 (three) times daily., Disp: , Rfl: 2 .  Magnesium-Potassium 40-40 MG CAPS, Take 1 capsule by mouth daily., Disp: , Rfl:  .  meloxicam (MOBIC) 15 MG tablet, Take 15 mg by mouth daily., Disp: , Rfl: 1 .  metoprolol tartrate (LOPRESSOR) 50 MG tablet, Take 1 tablet (50 mg total) by mouth 2 (two) times daily., Disp: 180 tablet, Rfl: 3 .  Naldemedine Tosylate (SYMPROIC) 0.2 MG TABS, Take 1 tablet by mouth daily., Disp: , Rfl:  .  nitroGLYCERIN (NITROSTAT) 0.4 MG SL tablet, Place 1 tablet (0.4 mg total) under the tongue every 5 (five) minutes as needed for chest pain., Disp: 20 tablet, Rfl: 0 .  oxyCODONE-acetaminophen (PERCOCET) 10-325 MG tablet, Take 1 tablet by mouth 3 (three) times daily. , Disp: , Rfl:  .  pantoprazole (PROTONIX) 40 MG tablet, Take 1 tablet (40 mg total) by mouth 2 (two) times daily., Disp: 60 tablet, Rfl: 1 .  predniSONE (DELTASONE) 10 MG tablet, 2 tabs po qd x 3d, then 1 tab po qd x 3d, then resume your usual  daily dose of 5 mg,  Disp: 9 tablet, Rfl: 0 .  predniSONE (DELTASONE) 5 MG tablet, Take 1 tablet by mouth once daily with breakfast, Disp: 30 tablet, Rfl: 1 .  pregabalin (LYRICA) 200 MG capsule, Take 200 mg by mouth at bedtime., Disp: , Rfl:  .  promethazine (PHENERGAN) 25 MG tablet, TAKE 1 TABLET BY MOUTH EVERY 6 HOURS AS NEEDED FOR NAUSEA AND VOMITING, Disp: 30 tablet, Rfl: 3 .  rOPINIRole (REQUIP) 0.25 MG tablet, TAKE 3 TABLETS BY MOUTH AT BEDTIME, Disp: 270 tablet, Rfl: 1 .  Tiotropium Bromide-Olodaterol (STIOLTO RESPIMAT) 2.5-2.5 MCG/ACT AERS, Inhale 2 puffs into the lungs daily., Disp: 1 Inhaler, Rfl: 1  EXAM:  VITALS per patient if applicable: BP (!) 607/37 (BP Location: Left Arm, Patient Position: Sitting, Cuff Size: Large)   Pulse (!) 105   SpO2 91%    GENERAL: alert, oriented, appears well and in no acute distress  HEENT: atraumatic, conjunttiva clear, no obvious abnormalities on inspection of external nose and ears  NECK: normal movements of the head and neck  LUNGS: on inspection no signs of respiratory distress, breathing rate appears normal, no obvious gross SOB, gasping or wheezing  CV: no obvious cyanosis  MS: moves all visible extremities without noticeable abnormality  PSYCH/NEURO: pleasant and cooperative, no obvious depression or anxiety, speech and thought processing grossly intact  LABS: none today    Chemistry      Component Value Date/Time   NA 136 05/29/2017 1638   K 4.2 05/29/2017 1638   CL 101 05/29/2017 1638   CO2 22 05/29/2017 1638   BUN 11 05/29/2017 1638   CREATININE 0.71 05/29/2017 1638      Component Value Date/Time   CALCIUM 9.6 05/29/2017 1638   ALKPHOS 71 05/29/2017 1638   AST 21 05/29/2017 1638   ALT 17 05/29/2017 1638   BILITOT 0.9 05/29/2017 1638     Lab Results  Component Value Date   WBC 11.9 (H) 01/30/2018   HGB 14.5 01/30/2018   HCT 42.6 01/30/2018   MCV 88.2 01/30/2018   PLT 368.0 01/30/2018   Lab Results  Component  Value Date   CHOL 263 (H) 03/24/2014   HDL 47 03/24/2014   LDLCALC 169 (H) 03/24/2014   TRIG 237 (H) 03/24/2014   CHOLHDL 5.6 03/24/2014   Lab Results  Component Value Date   TSH 1.12 10/08/2016   Lab Results  Component Value Date   HGBA1C 5.9 (H) 03/23/2014    ASSESSMENT AND PLAN:  Discussed the following assessment and plan:   1) Recurrent allergic + infectious rhinosinusitis +COPD/chronic bronchitis--> in the setting of morbid obesity with obesity hypoventilation syndrome and ongoing tobacco abuse. Chronic COPD sx's, with lack of good response to bronchodilators, equivocal PFTs in the past, and gradually progressive worsening of symptoms.  We have progressed to a trial of daily prednisone 5mg  for the last 2 mo.   Will recheck CT chest w/out contrast (pt with contrast allergy) to see if any interval changes to suggest alternative dx and to recheck the sclerotic lesion in T spine that were felt to be benign-appearing but had grown some at the time of last CT in 2018. Pt encouraged once again to call pulm to schedule consult visit that we have referred her for. Hopefully she'll get good response to stiolto respimat. If pulm continues her on prednisone daily then we'll get annual eye exam and check DEXA q1-2 yrs. HbA1c next visit in 1 mo.  2) RAsh: 1 yr, asymptomatic.  I need to examine  this when she comes into office in 1 mo.  3) Anxiety: Continue clonazepam; obtain most recent UDS from her pain mgmt clinic. Will get her to update CSC at f/u in 1 mo.  4) HTN: The current medical regimen is effective;  continue present plan and medications. Check CMET 1 mo.  Labs for f/u in 1 mo are ordered and need to be released at that visit.  I discussed the assessment and treatment plan with the patient. The patient was provided an opportunity to ask questions and all were answered. The patient agreed with the plan and demonstrated an understanding of the instructions.   The patient was  advised to call back or seek an in-person evaluation if the symptoms worsen or if the condition fails to improve as anticipated.  F/u: 1 mo  Signed:  Crissie Sickles, MD           08/27/2018

## 2018-09-11 ENCOUNTER — Ambulatory Visit (INDEPENDENT_AMBULATORY_CARE_PROVIDER_SITE_OTHER): Payer: Commercial Managed Care - PPO | Admitting: Pulmonary Disease

## 2018-09-11 ENCOUNTER — Encounter: Payer: Self-pay | Admitting: Pulmonary Disease

## 2018-09-11 ENCOUNTER — Other Ambulatory Visit: Payer: Self-pay

## 2018-09-11 VITALS — BP 128/70 | HR 81 | Temp 98.3°F | Ht 61.0 in | Wt 269.2 lb

## 2018-09-11 DIAGNOSIS — J449 Chronic obstructive pulmonary disease, unspecified: Secondary | ICD-10-CM

## 2018-09-11 DIAGNOSIS — R490 Dysphonia: Secondary | ICD-10-CM | POA: Diagnosis not present

## 2018-09-11 MED ORDER — TRELEGY ELLIPTA 100-62.5-25 MCG/INH IN AEPB: 1.0000 | INHALATION_SPRAY | Freq: Every day | RESPIRATORY_TRACT | 0 refills | Status: DC

## 2018-09-11 NOTE — Progress Notes (Signed)
Martha Espinoza    [id number]    1966/02/03  Primary Care Physician:McGowen, Adrian Blackwater, MD  Referring Physician: Tammi Sou, MD [address] [place],  [place] [address]  Chief complaint:  Consult for asthma, COPD  HPI: 53 year old heavy smoker with history of COPD, asthma, multiple psychiatric issues including bipolar disorder, multiple personality disorder, depression, suicidal ideation Complaints of persistent dyspnea, wheezing has chronic cough with white mucus.  She has received multiple rounds of prednisone in the past.  She has either not tolerated or has been unable to afford many controller inhalers, including QVAR, flovent, symbicort, advair, pulmicort, bevespi, and spiriva.  Most recently she was started on Anoro and Stiolto but had to stop as she developed mouth ulcers.  Currently just using albuterol as needed.  Also on supplemental oxygen at night Has seasonal allergies and acid reflux for which she is on PPI  Pets: No pets Occupation: Disabled Exposures: No known exposures, no hot tub, Jacuzzi Smoking history: 30-pack-year smoker.  Continues to smoke half pack per day Travel history: No significant travel history Relevant family history: No significant family history of lung disease ACQ 7 09/11/2018- 3.43  Outpatient Encounter Medications as of 09/11/2018  Medication Sig  . albuterol (PROVENTIL HFA) 108 (90 Base) MCG/ACT inhaler Inhale 1-2 puffs into the lungs every 4 (four) hours as needed.  Marland Kitchen albuterol (PROVENTIL) (2.5 MG/3ML) 0.083% nebulizer solution Take 3 mLs (2.5 mg total) by nebulization every 6 (six) hours as needed for wheezing or shortness of breath.  . AMITIZA 24 MCG capsule Take 24 mcg by mouth 2 (two) times daily.  Marland Kitchen aspirin 81 MG chewable tablet Chew 1 tablet (81 mg total) by mouth daily.  Marland Kitchen azelastine (ASTELIN) 0.1 % nasal spray Place 2 sprays into both nostrils 2 (two) times daily.  . busPIRone (BUSPAR) 15 MG tablet 1 in the morning  and 2 at bedtime  . cetirizine (ZYRTEC) 10 MG tablet TAKE 1 TABLET BY MOUTH ONCE DAILY  . clonazePAM (KLONOPIN) 0.5 MG tablet TAKE 1 TABLET BY MOUTH DURING THE DAY AS NEEDED AND 2 AT BEDTIME  . cyclobenzaprine (FLEXERIL) 10 MG tablet TAKE ONE TABLET BY MOUTH EVERY 6 HOURS AS NEEDED (SPASM RELATED TO HEADACHES) (Patient taking differently: Take 10 mg by mouth 3 (three) times daily. TAKE ONE TABLET BY MOUTH EVERY 6 HOURS AS NEEDED (SPASM RELATED TO HEADACHES))  . diphenhydrAMINE (BENADRYL) 25 MG tablet Take 1 tablet (25 mg total) by mouth every 6 (six) hours. (Patient taking differently: Take 25-50 mg by mouth every 6 (six) hours as needed for itching or allergies. )  . fluticasone (FLONASE) 50 MCG/ACT nasal spray Place 2 sprays into both nostrils daily.  . hydrOXYzine (ATARAX/VISTARIL) 25 MG tablet Take 25 mg by mouth 3 (three) times daily.   Marland Kitchen IRON-FOLIC ACID PO Take 65 mg by mouth daily.   Marland Kitchen lamoTRIgine (LAMICTAL) 100 MG tablet Take 200 mg by mouth at bedtime.  Marland Kitchen losartan (COZAAR) 50 MG tablet Take 1 tablet (50 mg total) by mouth daily.  Marland Kitchen LYRICA 150 MG capsule Take 1 tablet by mouth 3 (three) times daily.  . Magnesium-Potassium 40-40 MG CAPS Take 1 capsule by mouth daily.  . meloxicam (MOBIC) 15 MG tablet Take 15 mg by mouth daily.  . metoprolol tartrate (LOPRESSOR) 50 MG tablet Take 1 tablet (50 mg total) by mouth 2 (two) times daily.  Melynda Ripple Tosylate (SYMPROIC) 0.2 MG TABS Take 1 tablet  by mouth daily.  . nitroGLYCERIN (NITROSTAT) 0.4 MG SL tablet Place 1 tablet (0.4 mg total) under the tongue every 5 (five) minutes as needed for chest pain.  Marland Kitchen oxyCODONE-acetaminophen (PERCOCET) 10-325 MG tablet Take 1 tablet by mouth 3 (three) times daily.   . pantoprazole (PROTONIX) 40 MG tablet Take 1 tablet (40 mg total) by mouth 2 (two) times daily.  . predniSONE (DELTASONE) 5 MG tablet Take 1 tablet by mouth once daily with breakfast  . pregabalin (LYRICA) 200 MG capsule Take 200 mg by mouth at  bedtime.  . promethazine (PHENERGAN) 25 MG tablet TAKE 1 TABLET BY MOUTH EVERY 6 HOURS AS NEEDED FOR NAUSEA AND VOMITING  . rOPINIRole (REQUIP) 0.25 MG tablet TAKE 3 TABLETS BY MOUTH AT BEDTIME  . [DISCONTINUED] LATUDA 40 MG TABS tablet TAKE 1 TABLET BY MOUTH ONCE DAILY IN THE EVENING WITH FOOD (AT LEAST 350 CALORIES)  . [DISCONTINUED] predniSONE (DELTASONE) 10 MG tablet 2 tabs po qd x 3d, then 1 tab po qd x 3d, then resume your usual daily dose of 5 mg  . Tiotropium Bromide-Olodaterol (STIOLTO RESPIMAT) 2.5-2.5 MCG/ACT AERS Inhale 2 puffs into the lungs daily. (Patient not taking: Reported on 09/11/2018)  . [DISCONTINUED] citalopram (CELEXA) 40 MG tablet Take 20 mg by mouth at bedtime.   . [DISCONTINUED] omeprazole (PRILOSEC) 20 MG capsule Take 40 mg by mouth daily.   No facility-administered encounter medications on file as of 09/11/2018.     Allergies as of 09/11/2018 - Review Complete 09/11/2018  Allergen Reaction Noted  . Contrast media [iodinated diagnostic agents] Anaphylaxis 03/31/2014  . Shellfish allergy Anaphylaxis 11/15/2010  . Codeine Nausea And Vomiting and Other (See Comments)   . Furosemide Rash 02/18/2017  . Lamictal [lamotrigine] Rash 05/29/2017  . Anoro ellipta [umeclidinium-vilanterol] Other (See Comments) 08/20/2018  . Ivermectin Nausea And Vomiting 07/23/2015  . Spiriva handihaler [tiotropium bromide monohydrate] Other (See Comments) 05/01/2018  . Betadine [povidone iodine] Rash 05/24/2015  . Latex Hives 04/24/2011  . Lipitor [atorvastatin] Itching 04/18/2014  . Permethrin Itching 04/18/2014  . Tape Dermatitis 05/24/2015    Past Medical History:  Diagnosis Date  . Arthritis   . Bipolar 1 disorder ([place])   . Chronic pain syndrome    chronic low back pain (Pain mgmt= Dr. [name]).  MRI showed no signif disc dz, only showed some facet arthropathy at L4-5, with joint diastasis at L4-5--plan as of 11/07/16 neurosurg eval is bilat facet injections.  . Colitis due to  Clostridium difficile 2001  . COPD (chronic obstructive pulmonary disease) ([place])    PFTs 04/2016 showed no sign of copd or asthma, plus she actually had worse FEV1 after taking albuterol.  However, as of 06/2018 she has recent hx of improvement in sx's after taking albuterol.  LONG TIME SMOKER, won't quit.  Intolerant to or cannot afford: pulmicort, QVAR, advair, symbicort, flovent, bevespi, and spiriva (as of 06/2018).  . Cyst of left kidney 2016   Initially noted at 3 cm size, simple appearing, on CT abd/pelv in 2016.  On MRI L spine w and w/out contrast it was larger, lobulated--->ultrasound was done for f/u and it was 4.1 cm w/out worrisome features.  Marland Kitchen GAD (generalized anxiety disorder)   . GERD (gastroesophageal reflux disease)   . Hepatic hemangioma   . Hepatic steatosis 06/2016   Noted incidentally on CT chest  . History of Salmonella gastroenteritis   . Hyperlipidemia    a. Noted 02/2014.  Marland Kitchen Hypertension    HCTZ started 07/2015  .  IBS (irritable bowel syndrome)   . Iron deficiency anemia 05/2014   per pt it is not due to vaginal blood loss; hemoccults sent to pt in mail 413/16.  . Lumbar spondylosis    mild, mainly focused at L4-5 and L5-S1.  MRI L spine 09/09/17-->mild degenerative disc disease and facet arthrosis without any nerve root encroachment, no spinal stenosis.  . Microscopic hematuria    Intermittent (no w/u done yet, as of 03/2014)  . Migraine headache   . Morbid obesity ([place])   . Multiple lipomas 09/2016   left upper arm--very small ones.  . Nocturnal hypoxia 09/18/2017   Overnight oximetry -->qualified: ordered 2L oxygen during sleep.  . Obesity hypoventilation syndrome ([place])   . Pain in both lower legs 2018-2019   pain and numbness bilat LLs.  NCS/EMGs findings suggestive of S1 radiculopathy bilat: sx's + NCS findings suggestive of neurogenic claudication/spinal stenosis.  ? contribution of venous reflux?  . Peripheral edema Fall 2018   R>L, non-pitting for the most  part--venous doppler neg for DVT 01/2017.  Saw Dr. Bridgett Larsson (Vasc) 04/12/17 and u/s showed some venous reflux dz but he felt her sx's in legs were NOT due to her venous insufficiency OR PAD.  Thigh high comp stockings rx'd.  . Polycystic ovarian syndrome   . Pruritic dermatitis 2015/2016   +Scabies prep at [facility] 07/2014. (Primarily pruritic skin, but eventually a subtle rash as well)  . Raynaud's phenomenon 2019  . Rectal cancer ([place])    a. Followed by Dr. Gala Romney, dx 2000-2001. Tumor removed from rectal.  . Recurrent sinusitis    +allergic rhinitis  . Right shoulder pain 2017   [facility]: RC bursitis + RC tear (MRI)--injection trial is plan per pt report 05/04/16.  . Tobacco dependence   . Vitamin D deficiency     Past Surgical History:  Procedure Laterality Date  . ABI's complete Bilateral 09/30/2017   Normal ABIs.  Great toe pressures adequate for wound healing.  Femoral artery waveforms triphasic (normal).  . CARDIOVASCULAR STRESS TEST  03/24/14   Lexscan MIBI: mild anterior ischemia?  Cardiac CT angiogram recommended/done.  . CHOLECYSTECTOMY    . COLONOSCOPY  05/05/2007   adenoma  . coronary CT angio  03/29/14   Two vessel dz/moderate stenosis of mid LAD and proximal RCA; cath recommended.  Marland Kitchen DILATION AND CURETTAGE OF UTERUS     for vaginal bleeding  . JOINT REPLACEMENT    . LEFT HEART CATHETERIZATION WITH CORONARY ANGIOGRAM N/A 03/31/2014   Normal coronaries, EF 27-25%, diastolic dysfunction.  Procedure: LEFT HEART CATHETERIZATION WITH CORONARY ANGIOGRAM;  Surgeon: Sinclair Grooms, MD;  Location: [facility] CATH LAB;  Service: Cardiovascular;  Laterality: N/A;  . LOWER EXT VENOUS DOPPLERS Bilateral 02/08/2017   NEG for DVT (bilat)  . NCS/EMG  10/03/2017   S1 radiculopathy bilat  . Overnight oximetry testing  09/2017   qualified for oxygen supplementation during sleep  . PFTs  04/2016   No sign of COPD or asthma; FEV 1 worse after albut.  . Situ removed  age 80   High-grade  rectal adenoma removed from rectum   . TRANSTHORACIC ECHOCARDIOGRAM  03/23/14; 10/21/17   2015 Normal.  2019: mild LVH, EF 65-70%, study technically inadequate to assess diastolic function, valves were fine, wall motion normal.    Family History  Problem Relation Age of Onset  . Colon cancer Father 41  . Cancer Father   . Diverticulitis Mother   . Heart disease Mother   .  Hypertension Mother   . Hyperlipidemia Mother   . Mental illness Mother   . Diabetes Mother   . Heart attack Mother   . Stroke Neg Hx     Social History   Socioeconomic History  . Marital status: Married    Spouse name: Not on file  . Number of children: 1  . Years of education: Not on file  . Highest education level: Not on file  Occupational History  . Occupation: own Games developer: [employer]  . Financial resource strain: Not on file  . Food insecurity    Worry: Not on file    Inability: Not on file  . Transportation needs    Medical: Not on file    Non-medical: Not on file  Tobacco Use  . Smoking status: Current Every Day Smoker    Packs/day: 0.50    Years: 18.00    Pack years: 9.00    Types: Cigarettes  . Smokeless tobacco: Never Used  . Tobacco comment: 25 years  Substance and Sexual Activity  . Alcohol use: No    Alcohol/week: 0.0 standard drinks  . Drug use: No  . Sexual activity: Never    Birth control/protection: None, Surgical  Lifestyle  . Physical activity    Days per week: Not on file    Minutes per session: Not on file  . Stress: Not on file  Relationships  . Social Herbalist on phone: Not on file    Gets together: Not on file    Attends religious service: Not on file    Active member of club or organization: Not on file    Attends meetings of clubs or organizations: Not on file    Relationship status: Not on file  . Intimate partner violence    Fear of current or ex partner: Not on file    Emotionally abused: Not  on file    Physically abused: Not on file    Forced sexual activity: Not on file  Other Topics Concern  . Not on file  Social History Narrative  . Not on file    Review of systems: Review of Systems  Constitutional: Negative for fever and chills.  HENT: Negative.   Eyes: Negative for blurred vision.  Respiratory: as per HPI  Cardiovascular: Negative for chest pain and palpitations.  Gastrointestinal: Negative for vomiting, diarrhea, blood per rectum. Genitourinary: Negative for dysuria, urgency, frequency and hematuria.  Musculoskeletal: Negative for myalgias, back pain and joint pain.  Skin: Negative for itching and rash.  Neurological: Negative for dizziness, tremors, focal weakness, seizures and loss of consciousness.  Endo/Heme/Allergies: Negative for environmental allergies.  Psychiatric/Behavioral: Negative for depression, suicidal ideas and hallucinations.  All other systems reviewed and are negative.  Physical Exam: Blood pressure 128/70, pulse 81, temperature 98.3 F (36.8 C), temperature source Oral, height 5\' 1"  (1.549 m), weight 269 lb 3.2 oz (122.1 kg), SpO2 95 %. Gen:      No acute distress HEENT:  EOMI, sclera anicteric Neck:     No masses; no thyromegaly Lungs:    End expiratory wheeze.  Mainly appreciated in the upper airway and neck.  No crackles or rails. CV:         Regular rate and rhythm; no murmurs Abd:      + bowel sounds; soft, non-tender; no palpable masses, no distension Ext:    No edema; adequate peripheral perfusion Skin:  Warm and dry; no rash Neuro: alert and oriented x 3 Psych: Flat affect  Data Reviewed: Imaging: CT high-resolution 07/06/2016-no interstitial lung disease.  Mild trapping.  Coronary atherosclerosis  Chest x-ray 05/01/2018- no active cardiopulmonary disease. I have reviewed the images personally.  PFTs: 05/18/2016 FVC 1.65 [53%], FEV1 1.47 [59%], F/F 89, TLC 82%, DLCO 63%, DLCO/VA 96% No airway obstruction.  Spirometry  worsening with inhaled bronchodilator.  Moderate reduction in diffusion capacity that corrects for volume.  Labs: CBC 01/30/2018-WBC 11.9, hemoglobin 14.5, platelets 368, eos 2.3%, absolute eosinophil count 274.  10/08/2016-ANA, CCP, rheumatoid factor-negative  Assessment:  Consult for dyspnea Probably has COPD, asthma however PFTs in 2018 showed no airway obstruction by spirometry. Repeat pulmonary function test, check CBC differential and IgE We will start her on Trelegy inhaler.  Reminded her to rinse her mouth thoroughly after use as she has problems with mouth ulcers with other inhalers in the past.  Given upper airway predominance of wheeze and significant psychiatric history there may be a component of vocal cord dysfunction and/or upper airway inflammation from LPR, GERD.  I will send her to ENT for evaluation as she also has symptoms of intermittent hoarseness  GERD Continue PPI  Active smoker Encourage smoking cessation but she is unable to quit due to stress.  She had tried nicotine patches, lozenges and gum in the past.  She was prescribed Chantix but could not afford due to cost.  Plan/Recommendations: - Trelegy, rinse mouth thoroughly after use - CBC, IgE, PFTs - ENT evaluation - Protonix  Marshell Garfinkel MD [facility] 09/11/2018, 12:15 PM  CC: McGowen, Adrian Blackwater, MD

## 2018-09-11 NOTE — Patient Instructions (Signed)
We will start you on a medication called Trelegy.  Use this once a day.  Remember to rinse her mouth thoroughly after each use We will check CBC differential, IgE.  This can be drawn at your primary care visit We will schedule for PFTs  We will follow the results of the CT scan We will refer you for ENT evaluation Continue to work on smoking cessation Continue Protonix for acid reflux  Follow-up in 1 to 2 months

## 2018-09-12 ENCOUNTER — Ambulatory Visit (HOSPITAL_COMMUNITY)
Admission: RE | Admit: 2018-09-12 | Discharge: 2018-09-12 | Disposition: A | Discharge status: Home / Self Care | Payer: Commercial Managed Care - PPO | Source: Ambulatory Visit | Attending: Family Medicine | Admitting: Family Medicine

## 2018-09-12 DIAGNOSIS — R05 Cough: Secondary | ICD-10-CM | POA: Diagnosis not present

## 2018-09-12 DIAGNOSIS — J42 Unspecified chronic bronchitis: Secondary | ICD-10-CM | POA: Insufficient documentation

## 2018-09-12 DIAGNOSIS — R0600 Dyspnea, unspecified: Secondary | ICD-10-CM

## 2018-09-12 DIAGNOSIS — R0602 Shortness of breath: Secondary | ICD-10-CM | POA: Diagnosis not present

## 2018-09-12 DIAGNOSIS — R937 Abnormal findings on diagnostic imaging of other parts of musculoskeletal system: Secondary | ICD-10-CM

## 2018-09-12 DIAGNOSIS — J40 Bronchitis, not specified as acute or chronic: Secondary | ICD-10-CM | POA: Diagnosis not present

## 2018-09-12 DIAGNOSIS — R0609 Other forms of dyspnea: Secondary | ICD-10-CM | POA: Insufficient documentation

## 2018-09-12 DIAGNOSIS — R053 Chronic cough: Secondary | ICD-10-CM

## 2018-09-12 HISTORY — DX: Abnormal findings on diagnostic imaging of other parts of musculoskeletal system: R93.7

## 2018-09-14 ENCOUNTER — Encounter: Payer: Self-pay | Admitting: Family Medicine

## 2018-09-15 ENCOUNTER — Encounter: Payer: Self-pay | Admitting: Family Medicine

## 2018-09-15 ENCOUNTER — Other Ambulatory Visit: Payer: Self-pay | Admitting: Family Medicine

## 2018-09-15 DIAGNOSIS — R937 Abnormal findings on diagnostic imaging of other parts of musculoskeletal system: Secondary | ICD-10-CM

## 2018-09-15 NOTE — Progress Notes (Signed)
Orders only encounter

## 2018-09-23 ENCOUNTER — Telehealth: Payer: Self-pay | Admitting: Pulmonary Disease

## 2018-09-23 NOTE — Telephone Encounter (Signed)
Called Martha Espinoza back with recommendations.  Martha Espinoza stated she can not take tylenol or advil.  Martha Espinoza stated she stopped the inhaler and headache felt better and then went back to inhaler and it came back.  Martha Espinoza also stated she gets migraines and it felt like the headache was headed that way.  Martha Espinoza would like Dr Matilde Bash recommendation.  Informed her he was not in office and she stated she would wait to hear from him.

## 2018-09-23 NOTE — Telephone Encounter (Signed)
Spoke with pt and she stated she is having severe headaches from the trelegy.  Pt stated trelegy on last OV:  Instructions  We will start you on a medication called Trelegy.  Use this once a day.  Remember to rinse her mouth thoroughly after each use We will check CBC differential, IgE.  This can be drawn at your primary care visit We will schedule for PFTs  We will follow the results of the CT scan We will refer you for ENT evaluation Continue to work on smoking cessation Continue Protonix for acid reflux  Follow-up in 1 to 2 months     She has failed many inhalers:  From last OV 09/11/18: She has received multiple rounds of prednisone in the past.  She has either not tolerated or has been unable to afford many controller inhalers, including QVAR, flovent, symbicort, advair, pulmicort, bevespi, and spiriva.  Most recently she was started on Anoro and Stiolto but had to stop as she developed mouth ulcers.  Currently just using albuterol as needed  Had a CT scan and has multiple lesions on spine.  Has appointment with "cancer doctor" on 09/25/18.  Beth please advise on trelegy.  Will also forward to Dr Vaughan Browner for [facility].

## 2018-09-23 NOTE — Telephone Encounter (Signed)
Headache can be a common side effect. Advise she try taking tylenol scheduled 650mg  3 times a day for the next 2-3 days to see if that helps. She has tried many inhalers in the past and hasn't tolerated them. We need to make every effort to find a regimen to control COPD symptoms.

## 2018-09-24 ENCOUNTER — Other Ambulatory Visit: Payer: Self-pay

## 2018-09-24 ENCOUNTER — Encounter (HOSPITAL_COMMUNITY): Payer: Self-pay | Admitting: Hematology

## 2018-09-24 ENCOUNTER — Ambulatory Visit (INDEPENDENT_AMBULATORY_CARE_PROVIDER_SITE_OTHER): Payer: Commercial Managed Care - PPO | Admitting: Family Medicine

## 2018-09-24 ENCOUNTER — Encounter: Payer: Self-pay | Admitting: Family Medicine

## 2018-09-24 VITALS — BP 133/85 | HR 93 | Temp 99.0°F | Resp 16 | Ht 61.0 in | Wt 271.2 lb

## 2018-09-24 DIAGNOSIS — R21 Rash and other nonspecific skin eruption: Secondary | ICD-10-CM

## 2018-09-24 DIAGNOSIS — I10 Essential (primary) hypertension: Secondary | ICD-10-CM | POA: Diagnosis not present

## 2018-09-24 DIAGNOSIS — J449 Chronic obstructive pulmonary disease, unspecified: Secondary | ICD-10-CM

## 2018-09-24 DIAGNOSIS — R7301 Impaired fasting glucose: Secondary | ICD-10-CM

## 2018-09-24 DIAGNOSIS — E7800 Pure hypercholesterolemia, unspecified: Secondary | ICD-10-CM | POA: Diagnosis not present

## 2018-09-24 LAB — CBC WITH DIFFERENTIAL/PLATELET
Basophils Absolute: 0.6 10*3/uL — ABNORMAL HIGH (ref 0.0–0.1)
Basophils Relative: 6.9 % — ABNORMAL HIGH (ref 0.0–3.0)
Eosinophils Absolute: 0.2 10*3/uL (ref 0.0–0.7)
Eosinophils Relative: 1.7 % (ref 0.0–5.0)
HCT: 43.7 % (ref 36.0–46.0)
Hemoglobin: 14.2 g/dL (ref 12.0–15.0)
Lymphocytes Relative: 34.2 % (ref 12.0–46.0)
Lymphs Abs: 3.1 10*3/uL (ref 0.7–4.0)
MCHC: 32.6 g/dL (ref 30.0–36.0)
MCV: 90 fl (ref 78.0–100.0)
Monocytes Absolute: 0.4 10*3/uL (ref 0.1–1.0)
Monocytes Relative: 4.2 % (ref 3.0–12.0)
Neutro Abs: 4.8 10*3/uL (ref 1.4–7.7)
Neutrophils Relative %: 53 % (ref 43.0–77.0)
Platelets: 273 10*3/uL (ref 150.0–400.0)
RBC: 4.86 Mil/uL (ref 3.87–5.11)
RDW: 14.4 % (ref 11.5–15.5)
WBC: 9.1 10*3/uL (ref 4.0–10.5)

## 2018-09-24 LAB — COMPREHENSIVE METABOLIC PANEL
ALT: 12 U/L (ref 0–35)
AST: 8 U/L (ref 0–37)
Albumin: 3.9 g/dL (ref 3.5–5.2)
Alkaline Phosphatase: 82 U/L (ref 39–117)
BUN: 9 mg/dL (ref 6–23)
CO2: 28 mEq/L (ref 19–32)
Calcium: 8.6 mg/dL (ref 8.4–10.5)
Chloride: 102 mEq/L (ref 96–112)
Creatinine, Ser: 0.7 mg/dL (ref 0.40–1.20)
GFR: 88.31 mL/min (ref >60.00)
Glucose, Bld: 86 mg/dL (ref 70–99)
Potassium: 4.2 mEq/L (ref 3.5–5.1)
Sodium: 137 mEq/L (ref 135–145)
Total Bilirubin: 0.4 mg/dL (ref 0.2–1.2)
Total Protein: 5.9 g/dL — ABNORMAL LOW (ref 6.0–8.3)

## 2018-09-24 LAB — LIPID PANEL
Cholesterol: 260 mg/dL — ABNORMAL HIGH (ref 0–200)
HDL: 35.4 mg/dL — ABNORMAL LOW (ref >39.00)
NonHDL: 224.39
Total CHOL/HDL Ratio: 7
Triglycerides: 245 mg/dL — ABNORMAL HIGH (ref 0.0–149.0)
VLDL: 49 mg/dL — ABNORMAL HIGH (ref 0.0–40.0)

## 2018-09-24 LAB — LDL CHOLESTEROL, DIRECT: Direct LDL: 199 mg/dL

## 2018-09-24 LAB — HEMOGLOBIN A1C: Hgb A1c MFr Bld: 5.8 % (ref 4.6–6.5)

## 2018-09-24 NOTE — Telephone Encounter (Signed)
Still waiting on recommendations from Dr. Vaughan Browner. Dr. Vaughan Browner, please advise. Thanks!

## 2018-09-24 NOTE — Telephone Encounter (Signed)
Stop trelegy.  She has not tolerated multiple inhalers in past. We will review PFTs when completed and determine if she needs any alternate therapy.

## 2018-09-24 NOTE — Progress Notes (Signed)
OFFICE VISIT  09/24/2018   CC:  Chief Complaint  Patient presents with  . Follow-up    COPD, pt is fasting   HPI:    Patient is a 53 y.o.  female who presents for 1 mo f/u COPD and also here for rash evaluation. Since I last saw her she was the pulmonologist, Dr. Vaughan Browner, on 09/11/18). His plan as of that visit was to repeat PFTs, check CBC w/diff and IgE.  Started her on trelegy inhaler. He also felt her wheeze was predominantly upper airway and referred her to ENT for consideration of this being vocal cord dysfunction and/or upper airway inflammation from LPR/GERD. He did keep her on the daily prednisone.  She is awaiting a call from his office for PFT scheduling and from ENT office to schedule consult.  He ordered her labs on 08/27/18 and she still has not gotten them drawn.  Interim hx:  CT chest 09/12/18 showed no signif change from a pulm standpoint but did show mildly progressive patchy sclerotic osseous lesions throughout the visualized skeletol compared to 2018 CT study.  Findings nonspecific, worrisome for progressive infiltrative marrow process such as myelofibrosis.  Sclerotic osseous metastatic dz seems less likely given slow change. I have referred her to hem/onc for further evaluation and she has appt tomorrow.  Has had rash off and on for years, no itching, sometimes looks like little brown patches (1-2 mm at the largest).  Sometimes this rash has gone away for a year at a time.  Now it is tiny light pink macules 1-2 mm in size  They get worse if she gets upset. Primary body areas that are most affected are axillary areas, tops of breasts, and on her back.   No hives, no vesicles, no painful lesions.  None on face.    Past Medical History:  Diagnosis Date  . Abnormal CT of thoracic spine 09/12/2018   Noncontrast.  Increased in number and TINY increase in size of tiny, patchy sclerotic T spine lesions compared to 2018 CT.  Hem/onc referral recommended by reading radiologist  to eval for marrow infiltrative process.  . Arthritis   . Bipolar 1 disorder ([place])   . CAD (coronary artery disease) 08/2018   1 V CAD noted on noncontrast CT chest 08/2018  . Chronic pain syndrome    chronic low back pain (Pain mgmt= Dr. [name]).  MRI showed no signif disc dz, only showed some facet arthropathy at L4-5, with joint diastasis at L4-5--plan as of 11/07/16 neurosurg eval is bilat facet injections.  . Colitis due to Clostridium difficile 2001  . COPD (chronic obstructive pulmonary disease) ([place])    PFTs 04/2016 showed no sign of copd or asthma, plus she actually had worse FEV1 after taking albuterol.  However, as of 06/2018 she has recent hx of improvement in sx's after taking albuterol.  LONG TIME SMOKER, won't quit.  Intolerant to or cannot afford: pulmicort, QVAR, advair, symbicort, flovent, bevespi, and spiriva (as of 06/2018).  . Cyst of left kidney 2016   Initially noted at 3 cm size, simple appearing, on CT abd/pelv in 2016.  On MRI L spine w and w/out contrast it was larger, lobulated--->ultrasound was done for f/u and it was 4.1 cm w/out worrisome features.  Marland Kitchen GAD (generalized anxiety disorder)   . GERD (gastroesophageal reflux disease)   . Hepatic hemangioma   . Hepatic steatosis 06/2016   Noted incidentally on CT chest  . History of Salmonella gastroenteritis   . Hyperlipidemia  a. Noted 02/2014.  Marland Kitchen Hypertension    HCTZ started 07/2015  . IBS (irritable bowel syndrome)   . Iron deficiency anemia 05/2014   per pt it is not due to vaginal blood loss; hemoccults sent to pt in mail 413/16.  . Lumbar spondylosis    mild, mainly focused at L4-5 and L5-S1.  MRI L spine 09/09/17-->mild degenerative disc disease and facet arthrosis without any nerve root encroachment, no spinal stenosis.  . Microscopic hematuria    Intermittent (no w/u done yet, as of 03/2014)  . Migraine headache   . Morbid obesity ([place])   . Multiple lipomas 09/2016   left upper arm--very small ones.  .  Nocturnal hypoxia 09/18/2017   Overnight oximetry -->qualified: ordered 2L oxygen during sleep.  . Obesity hypoventilation syndrome ([place])   . Pain in both lower legs 2018-2019   pain and numbness bilat LLs.  NCS/EMGs findings suggestive of S1 radiculopathy bilat: sx's + NCS findings suggestive of neurogenic claudication/spinal stenosis.  ? contribution of venous reflux?  . Peripheral edema Fall 2018   R>L, non-pitting for the most part--venous doppler neg for DVT 01/2017.  Saw Dr. Bridgett Larsson (Vasc) 04/12/17 and u/s showed some venous reflux dz but he felt her sx's in legs were NOT due to her venous insufficiency OR PAD.  Thigh high comp stockings rx'd.  . Polycystic ovarian syndrome   . Pruritic dermatitis 2015/2016   +Scabies prep at [facility] 07/2014. (Primarily pruritic skin, but eventually a subtle rash as well)  . Raynaud's phenomenon 2019  . Rectal cancer ([place])    a. Followed by Dr. Gala Romney, dx 2000-2001. Tumor removed from rectal.  . Recurrent sinusitis    +allergic rhinitis  . Right shoulder pain 2017   Murphy/Wainer: RC bursitis + RC tear (MRI)--injection trial is plan per pt report 05/04/16.  . Tobacco dependence   . Vitamin D deficiency     Past Surgical History:  Procedure Laterality Date  . ABI's complete Bilateral 09/30/2017   Normal ABIs.  Great toe pressures adequate for wound healing.  Femoral artery waveforms triphasic (normal).  . CARDIOVASCULAR STRESS TEST  03/24/14   Lexscan MIBI: mild anterior ischemia?  Cardiac CT angiogram recommended/done.  . CHOLECYSTECTOMY    . COLONOSCOPY  05/05/2007   adenoma  . coronary CT angio  03/29/14   Two vessel dz/moderate stenosis of mid LAD and proximal RCA; cath recommended.  Marland Kitchen DILATION AND CURETTAGE OF UTERUS     for vaginal bleeding  . JOINT REPLACEMENT    . LEFT HEART CATHETERIZATION WITH CORONARY ANGIOGRAM N/A 03/31/2014   Normal coronaries, EF 70-62%, diastolic dysfunction.  Procedure: LEFT HEART CATHETERIZATION WITH CORONARY  ANGIOGRAM;  Surgeon: Sinclair Grooms, MD;  Location: [facility] CATH LAB;  Service: Cardiovascular;  Laterality: N/A;  . LOWER EXT VENOUS DOPPLERS Bilateral 02/08/2017   NEG for DVT (bilat)  . NCS/EMG  10/03/2017   S1 radiculopathy bilat  . Overnight oximetry testing  09/2017   qualified for oxygen supplementation during sleep  . PFTs  04/2016   No sign of COPD or asthma; FEV 1 worse after albut.  . rectal mss  age 76   High-grade rectal adenoma removed from rectum   . TRANSTHORACIC ECHOCARDIOGRAM  03/23/14; 10/21/17   2015 Normal.  2019: mild LVH, EF 65-70%, study technically inadequate to assess diastolic function, valves were fine, wall motion normal.    Outpatient Medications Prior to Visit  Medication Sig Dispense Refill  . albuterol (PROVENTIL HFA) 108 (  90 Base) MCG/ACT inhaler Inhale 1-2 puffs into the lungs every 4 (four) hours as needed. 1 Inhaler 3  . albuterol (PROVENTIL) (2.5 MG/3ML) 0.083% nebulizer solution Take 3 mLs (2.5 mg total) by nebulization every 6 (six) hours as needed for wheezing or shortness of breath. 75 mL 6  . AMITIZA 24 MCG capsule Take 24 mcg by mouth 2 (two) times daily.  1  . aspirin 81 MG chewable tablet Chew 1 tablet (81 mg total) by mouth daily. 14 tablet 0  . azelastine (ASTELIN) 0.1 % nasal spray Place 2 sprays into both nostrils 2 (two) times daily. 30 mL 6  . busPIRone (BUSPAR) 15 MG tablet 1 in the morning and 2 at bedtime    . cetirizine (ZYRTEC) 10 MG tablet TAKE 1 TABLET BY MOUTH ONCE DAILY 30 tablet 11  . clonazePAM (KLONOPIN) 0.5 MG tablet TAKE 1 TABLET BY MOUTH DURING THE DAY AS NEEDED AND 2 AT BEDTIME 90 tablet 5  . cyclobenzaprine (FLEXERIL) 10 MG tablet TAKE ONE TABLET BY MOUTH EVERY 6 HOURS AS NEEDED (SPASM RELATED TO HEADACHES) (Patient taking differently: Take 10 mg by mouth 3 (three) times daily. TAKE ONE TABLET BY MOUTH EVERY 6 HOURS AS NEEDED (SPASM RELATED TO HEADACHES)) 42 tablet 5  . diphenhydrAMINE (BENADRYL) 25 MG tablet Take 1 tablet  (25 mg total) by mouth every 6 (six) hours. (Patient taking differently: Take 25-50 mg by mouth every 6 (six) hours as needed for itching or allergies. ) 20 tablet 0  . fluticasone (FLONASE) 50 MCG/ACT nasal spray Place 2 sprays into both nostrils daily. 16 g 1  . hydrOXYzine (ATARAX/VISTARIL) 25 MG tablet Take 25 mg by mouth 3 (three) times daily.     Marland Kitchen IRON-FOLIC ACID PO Take 65 mg by mouth daily.     Marland Kitchen lamoTRIgine (LAMICTAL) 100 MG tablet Take 200 mg by mouth at bedtime.    Marland Kitchen losartan (COZAAR) 50 MG tablet Take 1 tablet (50 mg total) by mouth daily. 90 tablet 1  . LYRICA 150 MG capsule Take 1 tablet by mouth 3 (three) times daily.  2  . Magnesium-Potassium 40-40 MG CAPS Take 1 capsule by mouth daily.    . meloxicam (MOBIC) 15 MG tablet Take 15 mg by mouth daily.  1  . metoprolol tartrate (LOPRESSOR) 50 MG tablet Take 1 tablet (50 mg total) by mouth 2 (two) times daily. 180 tablet 3  . Naldemedine Tosylate (SYMPROIC) 0.2 MG TABS Take 1 tablet by mouth daily.    . nitroGLYCERIN (NITROSTAT) 0.4 MG SL tablet Place 1 tablet (0.4 mg total) under the tongue every 5 (five) minutes as needed for chest pain. 20 tablet 0  . oxyCODONE-acetaminophen (PERCOCET) 10-325 MG tablet Take 1 tablet by mouth 3 (three) times daily.     . pantoprazole (PROTONIX) 40 MG tablet Take 1 tablet (40 mg total) by mouth 2 (two) times daily. 60 tablet 1  . predniSONE (DELTASONE) 5 MG tablet Take 1 tablet by mouth once daily with breakfast 30 tablet 1  . pregabalin (LYRICA) 200 MG capsule Take 200 mg by mouth at bedtime.    . promethazine (PHENERGAN) 25 MG tablet TAKE 1 TABLET BY MOUTH EVERY 6 HOURS AS NEEDED FOR NAUSEA AND VOMITING 30 tablet 3  . rOPINIRole (REQUIP) 0.25 MG tablet TAKE 3 TABLETS BY MOUTH AT BEDTIME 270 tablet 1  . Fluticasone-Umeclidin-Vilant (TRELEGY ELLIPTA) 100-62.5-25 MCG/INH AEPB Inhale 1 puff into the lungs daily. (Patient not taking: Reported on 09/24/2018) 1 each 0  .  Tiotropium Bromide-Olodaterol  (STIOLTO RESPIMAT) 2.5-2.5 MCG/ACT AERS Inhale 2 puffs into the lungs daily. (Patient not taking: Reported on 09/11/2018) 1 Inhaler 1   No facility-administered medications prior to visit.     Allergies  Allergen Reactions  . Contrast Media [Iodinated Diagnostic Agents] Anaphylaxis    Needs 13-hour prep  . Shellfish Allergy Anaphylaxis  . Codeine Nausea And Vomiting and Other (See Comments)    Hallucinations, Also sees people that are not there   . Furosemide Rash    Rash, soB.  . Lamictal [Lamotrigine] Rash  . Anoro Ellipta [Umeclidinium-Vilanterol] Other (See Comments)    Pt states it caused blisters.  . Ivermectin Nausea And Vomiting    N/V and rash  . Spiriva Handihaler [Tiotropium Bromide Monohydrate] Other (See Comments)    Severe headaches  . Betadine [Povidone Iodine] Rash  . Latex Hives  . Lipitor [Atorvastatin] Itching  . Permethrin Itching  . Stiolto Respimat [Tiotropium Bromide-Olodaterol] Other (See Comments)    Mouth ulcers  . Tape Dermatitis    Paper tape and clear  . Trelegy Ellipta [Fluticasone-Umeclidin-Vilant] Other (See Comments)    headaches    ROS As per HPI  PE: Blood pressure 133/85, pulse 93, temperature 99 F (37.2 C), temperature source Temporal, resp. rate 16, height 5\' 1"  (1.549 m), weight 271 lb 3.2 oz (123 kg), SpO2 94 %. Body mass index is 51.24 kg/m. Exam chaperoned by Deveron Furlong, CMA.  Gen: Alert, well appearing.  Patient is oriented to person, place, time, and situation. AFFECT: pleasant, lucid thought and speech. SKIN: overal pretty unremarkable other than some scattered freckle-like macules intermixed with some similar sized nonpalpable light pink macules that blanch with pressure.  No hives,no vesicles, no petechiae, no ecchymoses, no papules or pustules.  Areas most involved today: R axillary region, upper chest, back, both arms and both legs.  LABS:    Chemistry      Component Value Date/Time   NA 136 05/29/2017 1638   K  4.2 05/29/2017 1638   CL 101 05/29/2017 1638   CO2 22 05/29/2017 1638   BUN 11 05/29/2017 1638   CREATININE 0.71 05/29/2017 1638      Component Value Date/Time   CALCIUM 9.6 05/29/2017 1638   ALKPHOS 71 05/29/2017 1638   AST 21 05/29/2017 1638   ALT 17 05/29/2017 1638   BILITOT 0.9 05/29/2017 1638     Lab Results  Component Value Date   HGBA1C 5.9 (H) 03/23/2014   Lab Results  Component Value Date   CHOL 263 (H) 03/24/2014   HDL 47 03/24/2014   LDLCALC 169 (H) 03/24/2014   TRIG 237 (H) 03/24/2014   CHOLHDL 5.6 03/24/2014   Lab Results  Component Value Date   TSH 1.12 10/08/2016   Lab Results  Component Value Date   WBC 11.9 (H) 01/30/2018   HGB 14.5 01/30/2018   HCT 42.6 01/30/2018   MCV 88.2 01/30/2018   PLT 368.0 01/30/2018    IMPRESSION AND PLAN:  1) Chronic rash, very nonspecific and asymptomatic.  Unsure what this is but would be very unusual to be a rash associated with some systemic dz. Definitely does not look inflammatory or infectious.   I reassured the patient and told her to apply nothing to it and we'll take a watchful waiting approach.  2) COPD: we'll continue daily prednisone.  She has not been able to tolerate any controller inhalers (most recently Trelegy ellipta rx'd by Dr. Vaughan Browner).  Awaiting repeat PFTs scheduling through Pulm.  Awaiting [place] [facility] referral to be set up. I'll leave her on daily low dose prednisone at this time. Will get labs that Dr. Vaughan Browner ordered for her (she never attempted to get them drawn anywhere)--CBC w/diff, IgE and forward those to him.  Will also check CMET, FLP, HbA1c.  3) T spine sclerotic osseous lesions: to be further evaluated by hem/onc tomorrow at [facility].  4) HTN: The current medical regimen is effective;  continue present plan and medications. Lytes/cr today.  5) IFG: HbA1c today, esp being on low dose prednisone daily. An After Visit Summary was printed and given to the patient.  FOLLOW UP: Return  in about 6 weeks (around 11/05/2018) for routine chronic illness f/u.  Signed:  Crissie Sickles, MD           09/24/2018

## 2018-09-25 ENCOUNTER — Other Ambulatory Visit: Payer: Self-pay | Admitting: Family Medicine

## 2018-09-25 ENCOUNTER — Telehealth: Payer: Self-pay | Admitting: Family Medicine

## 2018-09-25 ENCOUNTER — Inpatient Hospital Stay (HOSPITAL_COMMUNITY): Payer: Commercial Managed Care - PPO | Attending: Hematology | Admitting: Hematology

## 2018-09-25 ENCOUNTER — Inpatient Hospital Stay (HOSPITAL_COMMUNITY): Payer: Commercial Managed Care - PPO

## 2018-09-25 DIAGNOSIS — M899 Disorder of bone, unspecified: Secondary | ICD-10-CM | POA: Diagnosis not present

## 2018-09-25 DIAGNOSIS — Z86008 Personal history of in-situ neoplasm of other site: Secondary | ICD-10-CM | POA: Diagnosis not present

## 2018-09-25 LAB — CBC WITH DIFFERENTIAL/PLATELET
Abs Immature Granulocytes: 0.02 10*3/uL (ref 0.00–0.07)
Basophils Absolute: 0 10*3/uL (ref 0.0–0.1)
Basophils Relative: 0 %
Eosinophils Absolute: 0.1 10*3/uL (ref 0.0–0.5)
Eosinophils Relative: 1 %
HCT: 45.5 % (ref 36.0–46.0)
Hemoglobin: 14.3 g/dL (ref 12.0–15.0)
Immature Granulocytes: 0 %
Lymphocytes Relative: 34 %
Lymphs Abs: 3.3 10*3/uL (ref 0.7–4.0)
MCH: 28.8 pg (ref 26.0–34.0)
MCHC: 31.4 g/dL (ref 30.0–36.0)
MCV: 91.5 fL (ref 80.0–100.0)
Monocytes Absolute: 0.5 10*3/uL (ref 0.1–1.0)
Monocytes Relative: 5 %
Neutro Abs: 5.9 10*3/uL (ref 1.7–7.7)
Neutrophils Relative %: 60 %
Platelets: 265 10*3/uL (ref 150–400)
RBC: 4.97 MIL/uL (ref 3.87–5.11)
RDW: 14.4 % (ref 11.5–15.5)
WBC: 9.8 10*3/uL (ref 4.0–10.5)
nRBC: 0 % (ref 0.0–0.2)

## 2018-09-25 LAB — COMPREHENSIVE METABOLIC PANEL
ALT: 15 U/L (ref 0–44)
AST: 11 U/L — ABNORMAL LOW (ref 15–41)
Albumin: 3.8 g/dL (ref 3.5–5.0)
Alkaline Phosphatase: 81 U/L (ref 38–126)
Anion gap: 11 (ref 5–15)
BUN: 11 mg/dL (ref 6–20)
CO2: 25 mmol/L (ref 22–32)
Calcium: 8.8 mg/dL — ABNORMAL LOW (ref 8.9–10.3)
Chloride: 103 mmol/L (ref 98–111)
Creatinine, Ser: 0.65 mg/dL (ref 0.44–1.00)
GFR calc Af Amer: >60 mL/min (ref >60)
GFR calc non Af Amer: >60 mL/min (ref >60)
Glucose, Bld: 100 mg/dL — ABNORMAL HIGH (ref 70–99)
Potassium: 3.8 mmol/L (ref 3.5–5.1)
Sodium: 139 mmol/L (ref 135–145)
Total Bilirubin: 0.6 mg/dL (ref 0.3–1.2)
Total Protein: 6.6 g/dL (ref 6.5–8.1)

## 2018-09-25 LAB — IGE: IgE (Immunoglobulin E), Serum: 11 kU/L (ref <=114)

## 2018-09-25 LAB — LACTATE DEHYDROGENASE: LDH: 100 U/L (ref 98–192)

## 2018-09-25 NOTE — Patient Instructions (Signed)
[place] at [facility] Discharge Instructions  You were seen today by Dr. Delton Coombes. He went over your history, family history and how you've been feeling. He will order a bone scan and lab work today. He will see you back in 2 weeks for follow up.   Thank you for choosing [place] at [facility] to provide your oncology and hematology care.  To afford each patient quality time with our provider, please arrive at least 15 minutes before your scheduled appointment time.   If you have a lab appointment with the [place] please come in thru the  Main Entrance and check in at the main information desk  You need to re-schedule your appointment should you arrive 10 or more minutes late.  We strive to give you quality time with our providers, and arriving late affects you and other patients whose appointments are after yours.  Also, if you no show three or more times for appointments you may be dismissed from the clinic at the providers discretion.     Again, thank you for choosing [facility].  Our hope is that these requests will decrease the amount of time that you wait before being seen by our physicians.       _____________________________________________________________  Should you have questions after your visit to [facility], please contact our office at [phone] between the hours of 8:00 a.m. and 4:30 p.m.  Voicemails left after 4:00 p.m. will not be returned until the following business day.  For prescription refill requests, have your pharmacy contact our office and allow 72 hours.    Cancer Center Support Programs:   > Cancer Support Group  2nd Tuesday of the month 1pm-2pm, Journey Room

## 2018-09-25 NOTE — Progress Notes (Signed)
[facility] NOTE  Patient Care Team: Tammi Sou, MD as PCP - General (Family Medicine) Gala Romney, Cristopher Estimable, MD (Gastroenterology) Phillips Odor, MD as Consulting Physician (Neurology) Barton Fanny, NP as Nurse Practitioner (Neurology) Conrad York Hamlet, MD (Inactive) as Consulting Physician (Vascular Surgery) Mal Misty, MD (Inactive) as Consulting Physician (Vascular Surgery) Marshell Garfinkel, MD as Consulting Physician (Pulmonary Disease)  CHIEF COMPLAINTS/PURPOSE OF CONSULTATION:  Sclerotic bone lesions on recent CT scan.  HISTORY OF PRESENTING ILLNESS:  Martha Espinoza 53 y.o. female is seen in consultation today for further work-up and management of sclerotic bone lesions on a CT of the chest high-resolution scan on 09/12/2018.  Mildly progressive patchy sclerotic lesions throughout the skeleton, many of which are very small.  Findings are nonspecific.  The lesions on T9 and L1 vertebral body have increased in size.  Largest on T9 measures 1.1 cm.  These lesions were present on prior scans from 2017 and 2018.  She denies any fevers, night sweats or weight loss in the last 6 months.  She has COPD and has been on and off on prednisone and other inhalers.  She denies any bleeding per rectum or melena.  Denies any changes in her bowel habits.  She reports remote history of rectal polyp carcinoma in situ removed in early 2000's.  She did not have any colonoscopy after 2009.  Her last mammogram was in April 2018.  She is a current active smoker, started smoking at age 74.  She is currently on disability secondary to COPD.  Prior to that she was working with [employer].  Denies any alcohol use.  Family history significant for father with colon cancer and a maternal grandfather with colon cancer.  MEDICAL HISTORY:  Past Medical History:  Diagnosis Date  . Abnormal CT of thoracic spine 09/12/2018   Noncontrast.  Increased in number and TINY increase in size of  tiny, patchy sclerotic T spine lesions compared to 2018 CT.  Hem/onc referral recommended by reading radiologist to eval for marrow infiltrative process.  . Arthritis   . Asthma   . Bipolar 1 disorder ([place])   . CAD (coronary artery disease) 08/2018   1 V CAD noted on noncontrast CT chest 08/2018  . Cancer [facility])    Rectal Cancer 2000  . Chronic pain syndrome    chronic low back pain (Pain mgmt= Dr. [name]).  MRI showed no signif disc dz, only showed some facet arthropathy at L4-5, with joint diastasis at L4-5--plan as of 11/07/16 neurosurg eval is bilat facet injections.  . Colitis due to Clostridium difficile 2001  . COPD (chronic obstructive pulmonary disease) ([place])    PFTs 04/2016 showed no sign of copd or asthma, plus she actually had worse FEV1 after taking albuterol.  However, as of 06/2018 she has recent hx of improvement in sx's after taking albuterol.  LONG TIME SMOKER, won't quit.  Intolerant to or cannot afford: pulmicort, QVAR, advair, symbicort, flovent, bevespi, and spiriva (as of 06/2018).  . Depression   . GAD (generalized anxiety disorder)   . GERD (gastroesophageal reflux disease)   . Hepatic hemangioma   . Hepatic steatosis 06/2016   Noted incidentally on CT chest  . History of Salmonella gastroenteritis   . Hyperlipidemia    a. Noted 02/2014.  Marland Kitchen Hypertension    HCTZ started 07/2015  . IBS (irritable bowel syndrome)   . Iron deficiency anemia 05/2014   per pt it is not due to vaginal blood  loss; hemoccults sent to pt in mail 413/16.  . Lumbar spondylosis    mild, mainly focused at L4-5 and L5-S1.  MRI L spine 09/09/17-->mild degenerative disc disease and facet arthrosis without any nerve root encroachment, no spinal stenosis.  . Microscopic hematuria    Intermittent (no w/u done yet, as of 03/2014)  . Migraine headache   . Morbid obesity ([place])   . Multiple lipomas 09/2016   left upper arm--very small ones.  . Neuromuscular disorder ([place])   . Nocturnal hypoxia 09/18/2017    Overnight oximetry -->qualified: ordered 2L oxygen during sleep.  . Obesity hypoventilation syndrome ([place])   . Pain in both lower legs 2018-2019   pain and numbness bilat LLs.  NCS/EMGs findings suggestive of S1 radiculopathy bilat: sx's + NCS findings suggestive of neurogenic claudication/spinal stenosis.  ? contribution of venous reflux?  . Peripheral edema Fall 2018   R>L, non-pitting for the most part--venous doppler neg for DVT 01/2017.  Saw Dr. Bridgett Larsson (Vasc) 04/12/17 and u/s showed some venous reflux dz but he felt her sx's in legs were NOT due to her venous insufficiency OR PAD.  Thigh high comp stockings rx'd.  . Polycystic ovarian syndrome   . Pruritic dermatitis 2015/2016   +Scabies prep at [facility] 07/2014. (Primarily pruritic skin, but eventually a subtle rash as well)  . Raynaud's phenomenon 2019  . Rectal cancer ([place])    a. Followed by Dr. Gala Romney, dx 2000-2001. Tumor removed from rectal.  . Recurrent sinusitis    +allergic rhinitis  . Right shoulder pain 2017   Murphy/Wainer: RC bursitis + RC tear (MRI)--injection trial is plan per pt report 05/04/16.  . Tobacco dependence   . Vitamin D deficiency     SURGICAL HISTORY: Past Surgical History:  Procedure Laterality Date  . ABI's complete Bilateral 09/30/2017   Normal ABIs.  Great toe pressures adequate for wound healing.  Femoral artery waveforms triphasic (normal).  . CARDIOVASCULAR STRESS TEST  03/24/14   Lexscan MIBI: mild anterior ischemia?  Cardiac CT angiogram recommended/done.  . CHOLECYSTECTOMY    . COLONOSCOPY  05/05/2007   adenoma  . coronary CT angio  03/29/14   Two vessel dz/moderate stenosis of mid LAD and proximal RCA; cath recommended.  Marland Kitchen DILATION AND CURETTAGE OF UTERUS     for vaginal bleeding  . JOINT REPLACEMENT    . LEFT HEART CATHETERIZATION WITH CORONARY ANGIOGRAM N/A 03/31/2014   Normal coronaries, EF 47-09%, diastolic dysfunction.  Procedure: LEFT HEART CATHETERIZATION WITH CORONARY ANGIOGRAM;   Surgeon: Sinclair Grooms, MD;  Location: [facility] CATH LAB;  Service: Cardiovascular;  Laterality: N/A;  . LOWER EXT VENOUS DOPPLERS Bilateral 02/08/2017   NEG for DVT (bilat)  . NCS/EMG  10/03/2017   S1 radiculopathy bilat  . Overnight oximetry testing  09/2017   qualified for oxygen supplementation during sleep  . PFTs  04/2016   No sign of COPD or asthma; FEV 1 worse after albut.  . rectal mss  age 60   High-grade rectal adenoma removed from rectum   . TRANSTHORACIC ECHOCARDIOGRAM  03/23/14; 10/21/17   2015 Normal.  2019: mild LVH, EF 65-70%, study technically inadequate to assess diastolic function, valves were fine, wall motion normal.    SOCIAL HISTORY: Social History   Socioeconomic History  . Marital status: Married    Spouse name: Not on file  . Number of children: 1  . Years of education: Not on file  . Highest education level: Not on file  Occupational History  . Occupation: own Games developer: [employer]  . Financial resource strain: Not hard at all  . Food insecurity    Worry: Never true    Inability: Never true  . Transportation needs    Medical: No    Non-medical: No  Tobacco Use  . Smoking status: Current Every Day Smoker    Packs/day: 0.50    Years: 18.00    Pack years: 9.00    Types: Cigarettes  . Smokeless tobacco: Never Used  . Tobacco comment: 25 years  Substance and Sexual Activity  . Alcohol use: No    Alcohol/week: 0.0 standard drinks  . Drug use: No  . Sexual activity: Never    Birth control/protection: None, Surgical  Lifestyle  . Physical activity    Days per week: 0 days    Minutes per session: 0 min  . Stress: Very much  Relationships  . Social Herbalist on phone: Once a week    Gets together: Once a week    Attends religious service: Never    Active member of club or organization: No    Attends meetings of clubs or organizations: Never    Relationship status: Married  .  Intimate partner violence    Fear of current or ex partner: No    Emotionally abused: No    Physically abused: No    Forced sexual activity: No  Other Topics Concern  . Not on file  Social History Narrative  . Not on file    FAMILY HISTORY: Family History  Problem Relation Age of Onset  . Colon cancer Father 57  . Cancer Father   . Heart disease Mother   . Hypertension Mother   . Hyperlipidemia Mother   . Mental illness Mother   . Diabetes Mother   . Heart attack Mother   . Thyroid disease Mother   . Colon cancer Maternal Grandfather   . Stroke Neg Hx     ALLERGIES:  is allergic to contrast media [iodinated diagnostic agents]; shellfish allergy; codeine; furosemide; anoro ellipta [umeclidinium-vilanterol]; ivermectin; spiriva handihaler [tiotropium bromide monohydrate]; betadine [povidone iodine]; latex; lipitor [atorvastatin]; permethrin; stiolto respimat [tiotropium bromide-olodaterol]; tape; and trelegy ellipta [fluticasone-umeclidin-vilant].  MEDICATIONS:  Current Outpatient Medications  Medication Sig Dispense Refill  . albuterol (PROVENTIL) (2.5 MG/3ML) 0.083% nebulizer solution Take 3 mLs (2.5 mg total) by nebulization every 6 (six) hours as needed for wheezing or shortness of breath. 75 mL 6  . AMITIZA 24 MCG capsule Take 24 mcg by mouth 2 (two) times daily.  1  . aspirin 81 MG chewable tablet Chew 1 tablet (81 mg total) by mouth daily. 14 tablet 0  . azelastine (ASTELIN) 0.1 % nasal spray Place 2 sprays into both nostrils 2 (two) times daily. 30 mL 6  . busPIRone (BUSPAR) 15 MG tablet 1 in the morning and 2 at bedtime    . cetirizine (ZYRTEC) 10 MG tablet TAKE 1 TABLET BY MOUTH ONCE DAILY 30 tablet 11  . clonazePAM (KLONOPIN) 0.5 MG tablet TAKE 1 TABLET BY MOUTH DURING THE DAY AS NEEDED AND 2 AT BEDTIME 90 tablet 5  . cyclobenzaprine (FLEXERIL) 10 MG tablet TAKE ONE TABLET BY MOUTH EVERY 6 HOURS AS NEEDED (SPASM RELATED TO HEADACHES) (Patient taking differently: Take  10 mg by mouth 3 (three) times daily. TAKE ONE TABLET BY MOUTH EVERY 6 HOURS AS NEEDED (SPASM RELATED TO HEADACHES)) 42 tablet 5  .  diphenhydrAMINE (BENADRYL) 25 MG tablet Take 1 tablet (25 mg total) by mouth every 6 (six) hours. (Patient taking differently: Take 25-50 mg by mouth every 6 (six) hours as needed for itching or allergies. ) 20 tablet 0  . fluticasone (FLONASE) 50 MCG/ACT nasal spray Place 2 sprays into both nostrils daily. 16 g 1  . hydrOXYzine (ATARAX/VISTARIL) 25 MG tablet Take 25 mg by mouth 3 (three) times daily.     Marland Kitchen IRON-FOLIC ACID PO Take 65 mg by mouth daily.     Marland Kitchen lamoTRIgine (LAMICTAL) 100 MG tablet Take 200 mg by mouth at bedtime.    Marland Kitchen LYRICA 150 MG capsule Take 1 tablet by mouth 3 (three) times daily.  2  . Magnesium-Potassium 40-40 MG CAPS Take 1 capsule by mouth daily.    . meloxicam (MOBIC) 15 MG tablet Take 15 mg by mouth daily.  1  . Naldemedine Tosylate (SYMPROIC) 0.2 MG TABS Take 1 tablet by mouth daily.    . nitroGLYCERIN (NITROSTAT) 0.4 MG SL tablet Place 1 tablet (0.4 mg total) under the tongue every 5 (five) minutes as needed for chest pain. 20 tablet 0  . oxyCODONE-acetaminophen (PERCOCET) 10-325 MG tablet Take 1 tablet by mouth 3 (three) times daily.     . pregabalin (LYRICA) 200 MG capsule Take 200 mg by mouth at bedtime.    . promethazine (PHENERGAN) 25 MG tablet TAKE 1 TABLET BY MOUTH EVERY 6 HOURS AS NEEDED FOR NAUSEA AND VOMITING 30 tablet 3  . albuterol (VENTOLIN HFA) 108 (90 Base) MCG/ACT inhaler INHALE 1 TO 2 PUFFS BY MOUTH EVERY 4 HOURS AS NEEDED 16 g 0  . losartan (COZAAR) 50 MG tablet Take 1 tablet by mouth once daily 90 tablet 0  . metoprolol tartrate (LOPRESSOR) 50 MG tablet Take 1 tablet by mouth twice daily 180 tablet 0  . pantoprazole (PROTONIX) 40 MG tablet Take 1 tablet by mouth once daily 90 tablet 0  . predniSONE (DELTASONE) 5 MG tablet Take 1 tablet by mouth once daily with breakfast 30 tablet 0  . rOPINIRole (REQUIP) 0.25 MG tablet TAKE  3 TABLETS BY MOUTH AT BEDTIME 270 tablet 0   No current facility-administered medications for this visit.     REVIEW OF SYSTEMS:   Constitutional: Denies fevers, chills or abnormal night sweats Eyes: Denies blurriness of vision, double vision or watery eyes Ears, nose, mouth, throat, and face: Denies mucositis or sore throat Respiratory: Denies cough, dyspnea or wheezes Cardiovascular: Denies palpitation, chest discomfort or lower extremity swelling Gastrointestinal:  Denies nausea, heartburn or change in bowel habits Skin: Denies abnormal skin rashes Lymphatics: Denies new lymphadenopathy or easy bruising Neurological:Denies numbness, tingling or new weaknesses Behavioral/Psych: Mood is stable, no new changes  All other systems were reviewed with the patient and are negative.  PHYSICAL EXAMINATION: ECOG PERFORMANCE STATUS: 1 - Symptomatic but completely ambulatory  Vitals:   09/25/18 1200  BP: (!) 170/81  Pulse: (!) 105  Resp: 20  Temp: (!) 97.5 F (36.4 C)  SpO2: 97%   Filed Weights   09/25/18 1200  Weight: 271 lb 7 oz (123.1 kg)    GENERAL:alert, no distress and comfortable SKIN: skin color, texture, turgor are normal, no significant lesions.  There is mild follicular rash on the extremities. EYES: normal, conjunctiva are pink and non-injected, sclera clear OROPHARYNX:no exudate, no erythema and lips, buccal mucosa, and tongue normal  NECK: supple, thyroid normal size, non-tender, without nodularity LYMPH:  no palpable lymphadenopathy in the cervical, axillary or  inguinal LUNGS: clear to auscultation and percussion with normal breathing effort HEART: regular rate & rhythm and no murmurs and no lower extremity edema ABDOMEN:abdomen soft, non-tender and normal bowel sounds Musculoskeletal:no cyanosis of digits and no clubbing  PSYCH: alert & oriented x 3 with fluent speech NEURO: no focal motor/sensory deficits  LABORATORY DATA:  I have reviewed the data as  listed Lab Results  Component Value Date   WBC 9.1 09/24/2018   HGB 14.2 09/24/2018   HCT 43.7 09/24/2018   MCV 90.0 09/24/2018   PLT 273.0 09/24/2018     Chemistry      Component Value Date/Time   NA 137 09/24/2018 1137   K 4.2 09/24/2018 1137   CL 102 09/24/2018 1137   CO2 28 09/24/2018 1137   BUN 9 09/24/2018 1137   CREATININE 0.70 09/24/2018 1137      Component Value Date/Time   CALCIUM 8.6 09/24/2018 1137   ALKPHOS 82 09/24/2018 1137   AST 8 09/24/2018 1137   ALT 12 09/24/2018 1137   BILITOT 0.4 09/24/2018 1137       RADIOGRAPHIC STUDIES: I have personally reviewed the radiological images as listed and agreed with the findings in the report. Ct Chest High Resolution  Result Date: 09/14/2018 CLINICAL DATA:  Chronic cough, bronchitis and dyspnea on exertion. Current smoker. EXAM: CT CHEST WITHOUT CONTRAST TECHNIQUE: Multidetector CT imaging of the chest was performed following the standard protocol without intravenous contrast. High resolution imaging of the lungs, as well as inspiratory and expiratory imaging, was performed. COMPARISON:  07/06/2016 high-resolution chest CT. 05/01/2018 chest radiograph. FINDINGS: Cardiovascular: Normal heart size. No significant pericardial effusion/thickening. Left anterior descending coronary atherosclerosis. Great vessels are normal in course and caliber. Mediastinum/Nodes: No discrete thyroid nodules. Unremarkable esophagus. No pathologically enlarged axillary, mediastinal or hilar lymph nodes, noting limited sensitivity for the detection of hilar adenopathy on this noncontrast study. Lungs/Pleura: No pneumothorax. No pleural effusion. No acute consolidative airspace disease, lung masses or significant pulmonary nodules. Mild patchy air trapping in both lungs on the expiration sequence. No significant regions of subpleural reticulation, ground-glass attenuation, traction bronchiectasis, architectural distortion or frank honeycombing. No  evidence of tracheobronchomalacia. Upper abdomen: Diffuse hepatic steatosis.  Cholecystectomy. Musculoskeletal: There are innumerable tiny sclerotic osseous lesions scattered throughout the visualized thoracic skeleton with a patchy distribution, increased in conspicuity and number in the interval. Previously described dominant sclerotic lesions in T9 and L1 vertebral bodies of slightly increased in size, for example measuring 1.1 cm in the posterior T9 vertebral body (series 8/image 96), previously 1.0 cm, and 0.7 cm in the superior L1 vertebral body (series 8/image 90), previously 0.6 cm. Mild thoracic spondylosis. IMPRESSION: 1. Mildly progressive patchy sclerotic osseous lesions throughout the visualized skeleton compared to 2018 CT study, many of which are tiny. Findings are nonspecific, worrisome for a progressive infiltrative marrow process such as myelofibrosis. Sclerotic osseous metastatic disease seems unlikely given the slow change, however is not entirely excluded. Hematology oncology consultation suggested. Bone marrow biopsy may be considered. 2. No evidence of interstitial lung disease. 3. Stable mild patchy air trapping in both lungs, indicative of small airways disease. 4. One vessel coronary atherosclerosis. 5. Diffuse hepatic steatosis. Electronically Signed   By: Ilona Sorrel M.D.   On: 09/14/2018 06:05    ASSESSMENT & PLAN:  Bone lesion 1.  Sclerotic bone lesions: - High resolution CT scan of the chest on 09/12/2018 showed innumerable tiny sclerotic lesions scattered throughout the thoracic spine, largest in T9 and L1 vertebral  bodies. -She has back pain from arthritis of long duration. - She had a history of carcinoma in situ in the rectal polyp which was removed in early 2000s.  Last colonoscopy on record was in 2009, a tubular adenoma was removed.  She lost to follow-up with GI. - Last mammogram was on 07/09/2016, BI-RADS Category 1. -Denies any fevers, night sweats or weight loss in  the last 6 months. - I have reviewed CT scan of the abdomen and pelvis from 2017.  The lesions in the T9 and L1 were present but slightly smaller. - Differential diagnosis includes benign bone islands although findings are nonspecific.  CBC on 09/24/2018 was normal.  Creatinine and calcium were normal. -Given her carcinoma in situ in the rectal polyp, I have recommended doing a whole-body bone scan.  We will also check a CEA level. -We will make a referral to Dr. Buford Dresser for follow-up colonoscopy. - We will set her up for a mammogram.  We will see her back after the above labs and scans.  2.  Smoking history: - She smoked 1 pack/day for 25 years, quit for 5 years and currently smoking about half pack per day.  She started smoking at age 89. - CT of the chest on 09/12/2018 did not show any lung lesions.  3.  Family history: -Maternal grandfather and her father had colon cancer.  Orders Placed This Encounter  Procedures  . NM Bone Scan Whole Body    Standing Status:   Future    Standing Expiration Date:   09/25/2019    Order Specific Question:   ** REASON FOR EXAM (FREE TEXT)    Answer:   sclerotic bone lesions on spine    Order Specific Question:   If indicated for the ordered procedure, I authorize the administration of a radiopharmaceutical per Radiology protocol    Answer:   Yes    Order Specific Question:   Is the patient pregnant?    Answer:   No    Order Specific Question:   Preferred imaging location?    Answer:   [facility]    Order Specific Question:   Radiology Contrast Protocol - do NOT remove file path    Answer:   [email or web address]  . CBC with Differential/Platelet    Standing Status:   Future    Standing Expiration Date:   09/25/2019  . Comprehensive metabolic panel    Standing Status:   Future    Standing Expiration Date:   09/25/2019  . Lactate dehydrogenase    Standing Status:   Future    Standing Expiration Date:   09/25/2019  . CEA     Standing Status:   Future    Standing Expiration Date:   09/25/2019  . Protein electrophoresis, serum    Standing Status:   Future    Standing Expiration Date:   09/25/2019  . Immunofixation electrophoresis    Standing Status:   Future    Standing Expiration Date:   09/25/2019  . Kappa/lambda light chains    Standing Status:   Future    Standing Expiration Date:   09/25/2019    All questions were answered. The patient knows to call the clinic with any problems, questions or concerns.      Derek Jack, MD 09/25/2018 2:07 PM

## 2018-09-25 NOTE — Assessment & Plan Note (Signed)
1.  Sclerotic bone lesions: - High resolution CT scan of the chest on 09/12/2018 showed innumerable tiny sclerotic lesions scattered throughout the thoracic spine, largest in T9 and L1 vertebral bodies. -She has back pain from arthritis of long duration. - She had a history of carcinoma in situ in the rectal polyp which was removed in early 2000s.  Last colonoscopy on record was in 2009, a tubular adenoma was removed.  She lost to follow-up with GI. - Last mammogram was on 07/09/2016, BI-RADS Category 1. -Denies any fevers, night sweats or weight loss in the last 6 months. - I have reviewed CT scan of the abdomen and pelvis from 2017.  The lesions in the T9 and L1 were present but slightly smaller. - Differential diagnosis includes benign bone islands although findings are nonspecific.  CBC on 09/24/2018 was normal.  Creatinine and calcium were normal. -Given her carcinoma in situ in the rectal polyp, I have recommended doing a whole-body bone scan.  We will also check a CEA level. -We will make a referral to Dr. Buford Dresser for follow-up colonoscopy. - We will set her up for a mammogram.  We will see her back after the above labs and scans.  2.  Smoking history: - She smoked 1 pack/day for 25 years, quit for 5 years and currently smoking about half pack per day.  She started smoking at age 30. - CT of the chest on 09/12/2018 did not show any lung lesions.  3.  Family history: -Maternal grandfather and her father had colon cancer.

## 2018-09-25 NOTE — Telephone Encounter (Signed)
Spoke with patient. She is aware to stop the Trelegy. She stated that she will continue to use the albuterol as needed as well as her O2 at night. She has been doing this for the past few days and is feeling much better. Advised her to call us back if she needed anything, she verbalized.   Nothing further needed at time of call.

## 2018-09-26 LAB — KAPPA/LAMBDA LIGHT CHAINS
Kappa free light chain: 11.5 mg/L (ref 3.3–19.4)
Kappa, lambda light chain ratio: 1.29 (ref 0.26–1.65)
Lambda free light chains: 8.9 mg/L (ref 5.7–26.3)

## 2018-09-26 LAB — CEA: CEA: 1.9 ng/mL (ref 0.0–4.7)

## 2018-09-29 ENCOUNTER — Other Ambulatory Visit: Payer: Self-pay

## 2018-09-29 ENCOUNTER — Other Ambulatory Visit (HOSPITAL_COMMUNITY): Payer: Self-pay | Admitting: Hematology

## 2018-09-29 DIAGNOSIS — Z1231 Encounter for screening mammogram for malignant neoplasm of breast: Secondary | ICD-10-CM

## 2018-09-29 DIAGNOSIS — E782 Mixed hyperlipidemia: Secondary | ICD-10-CM

## 2018-09-29 LAB — PROTEIN ELECTROPHORESIS, SERUM
A/G Ratio: 1.5 (ref 0.7–1.7)
Albumin ELP: 3.7 g/dL (ref 2.9–4.4)
Alpha-1-Globulin: 0.3 g/dL (ref 0.0–0.4)
Alpha-2-Globulin: 0.8 g/dL (ref 0.4–1.0)
Beta Globulin: 0.9 g/dL (ref 0.7–1.3)
Gamma Globulin: 0.5 g/dL (ref 0.4–1.8)
Globulin, Total: 2.5 g/dL (ref 2.2–3.9)
Total Protein ELP: 6.2 g/dL (ref 6.0–8.5)

## 2018-09-29 LAB — IMMUNOFIXATION ELECTROPHORESIS
IgA: 86 mg/dL — ABNORMAL LOW (ref 87–352)
IgG (Immunoglobin G), Serum: 664 mg/dL (ref 586–1602)
IgM (Immunoglobulin M), Srm: 79 mg/dL (ref 26–217)
Total Protein ELP: 6.3 g/dL (ref 6.0–8.5)

## 2018-09-29 MED ORDER — PRAVASTATIN SODIUM 40 MG PO TABS: 40.0000 mg | ORAL_TABLET | Freq: Every day | ORAL | 3 refills | Status: DC

## 2018-09-30 MED ORDER — PANTOPRAZOLE SODIUM 40 MG PO TBEC: 40.0000 mg | DELAYED_RELEASE_TABLET | Freq: Two times a day (BID) | ORAL | 1 refills | Status: DC

## 2018-09-30 NOTE — Telephone Encounter (Signed)
OK, new rx sent in.-thx

## 2018-09-30 NOTE — Addendum Note (Signed)
Addended by: Tammi Sou on: 09/30/2018 11:45 AM   Modules accepted: Orders

## 2018-09-30 NOTE — Telephone Encounter (Signed)
Pt stated the pharmacy don't have refill request for this medication pantoprazole (PROTONIX) 40 MG tablet. Please resend

## 2018-09-30 NOTE — Telephone Encounter (Signed)
Yes, the med section documents that you eRx'd it on 09/25/18 to [facility] in [place]. (??)

## 2018-09-30 NOTE — Telephone Encounter (Signed)
Patient states that she is supposed on pantoprazole BID?  Please advise.

## 2018-09-30 NOTE — Telephone Encounter (Signed)
This medication isn't on current med list, am I missing it?  Please advise.

## 2018-09-30 NOTE — Telephone Encounter (Signed)
Patient advised.

## 2018-10-03 ENCOUNTER — Encounter: Payer: Self-pay | Admitting: Internal Medicine

## 2018-10-06 ENCOUNTER — Encounter (HOSPITAL_COMMUNITY)
Admission: RE | Admit: 2018-10-06 | Discharge: 2018-10-06 | Disposition: A | Discharge status: Home / Self Care | Payer: Commercial Managed Care - PPO | Source: Ambulatory Visit | Attending: Hematology | Admitting: Hematology

## 2018-10-06 ENCOUNTER — Other Ambulatory Visit: Payer: Self-pay

## 2018-10-06 ENCOUNTER — Encounter: Payer: Self-pay | Admitting: Internal Medicine

## 2018-10-06 DIAGNOSIS — M899 Disorder of bone, unspecified: Secondary | ICD-10-CM | POA: Diagnosis not present

## 2018-10-06 DIAGNOSIS — M25511 Pain in right shoulder: Secondary | ICD-10-CM | POA: Diagnosis not present

## 2018-10-06 MED ORDER — TECHNETIUM TC 99M MEDRONATE IV KIT
20.0000 | PACK | Freq: Once | INTRAVENOUS | Status: AC | PRN
  Administered 2018-10-06: 19.03 via INTRAVENOUS

## 2018-10-08 ENCOUNTER — Inpatient Hospital Stay (HOSPITAL_BASED_OUTPATIENT_CLINIC_OR_DEPARTMENT_OTHER): Payer: Commercial Managed Care - PPO | Admitting: Hematology

## 2018-10-08 ENCOUNTER — Encounter (HOSPITAL_COMMUNITY): Payer: Self-pay | Admitting: Hematology

## 2018-10-08 ENCOUNTER — Other Ambulatory Visit: Payer: Self-pay

## 2018-10-08 VITALS — BP 129/91 | HR 105 | Temp 97.8°F | Resp 20 | Wt 266.8 lb

## 2018-10-08 DIAGNOSIS — M899 Disorder of bone, unspecified: Secondary | ICD-10-CM

## 2018-10-08 DIAGNOSIS — Z86008 Personal history of in-situ neoplasm of other site: Secondary | ICD-10-CM

## 2018-10-08 NOTE — Progress Notes (Signed)
[place], [place] [address]   CLINIC:  Medical Oncology/Hematology  PCP:  Tammi Sou, MD [address] [place] [address] [phone]   REASON FOR VISIT:  Follow-up for bone lesions.    INTERVAL HISTORY:  Martha Espinoza 53 y.o. female seen for follow-up of sclerotic bone lesions.  She denies any pains in the mid back region.  However she has back pain from arthritis of long duration.  Denies any rectal bleeding.  Denies any fevers, night sweats or weight loss.  Cough and shortness of breath has been stable.  Decreased energy and appetite levels have also been stable.  She has occasional nausea.  She is having mammogram done later this week.    REVIEW OF SYSTEMS:  Review of Systems  Respiratory: Positive for cough and shortness of breath.   Gastrointestinal: Positive for nausea.  Musculoskeletal: Positive for back pain.  All other systems reviewed and are negative.    PAST MEDICAL/SURGICAL HISTORY:  Past Medical History:  Diagnosis Date   Abnormal CT of thoracic spine 09/12/2018   Noncontrast.  Increased in number and TINY increase in size of tiny, patchy sclerotic T spine lesions compared to 2018 CT.  Hem/onc referral recommended by reading radiologist to eval for marrow infiltrative process.   Arthritis    Asthma    Bipolar 1 disorder (HCC)    CAD (coronary artery disease) 08/2018   1 V CAD noted on noncontrast CT chest 08/2018   Cancer [facility])    Rectal Cancer 2000   Chronic pain syndrome    chronic low back pain (Pain mgmt= Dr. [name]).  MRI showed no signif disc dz, only showed some facet arthropathy at L4-5, with joint diastasis at L4-5--plan as of 11/07/16 neurosurg eval is bilat facet injections.   Colitis due to Clostridium difficile 2001   COPD (chronic obstructive pulmonary disease) ([place])    PFTs 04/2016 showed no sign of copd or asthma, plus she actually had worse FEV1 after taking albuterol.   However, as of 06/2018 she has recent hx of improvement in sx's after taking albuterol.  LONG TIME SMOKER, won't quit.  Intolerant to or cannot afford: pulmicort, QVAR, advair, symbicort, flovent, bevespi, and spiriva (as of 06/2018).   Depression    GAD (generalized anxiety disorder)    GERD (gastroesophageal reflux disease)    Hepatic hemangioma    Hepatic steatosis 06/2016   Noted incidentally on CT chest   History of Salmonella gastroenteritis    Hyperlipidemia    a. Noted 02/2014.   Hypertension    HCTZ started 07/2015   IBS (irritable bowel syndrome)    Iron deficiency anemia 05/2014   per pt it is not due to vaginal blood loss; hemoccults sent to pt in mail 413/16.   Lumbar spondylosis    mild, mainly focused at L4-5 and L5-S1.  MRI L spine 09/09/17-->mild degenerative disc disease and facet arthrosis without any nerve root encroachment, no spinal stenosis.   Microscopic hematuria    Intermittent (no w/u done yet, as of 03/2014)   Migraine headache    Morbid obesity ([place])    Multiple lipomas 09/2016   left upper arm--very small ones.   Neuromuscular disorder ([place])    Nocturnal hypoxia 09/18/2017   Overnight oximetry -->qualified: ordered 2L oxygen during sleep.   Obesity hypoventilation syndrome (HCC)    Pain in both lower legs 2018-2019   pain and numbness bilat LLs.  NCS/EMGs findings  suggestive of S1 radiculopathy bilat: sx's + NCS findings suggestive of neurogenic claudication/spinal stenosis.  ? contribution of venous reflux?   Peripheral edema Fall 2018   R>L, non-pitting for the most part--venous doppler neg for DVT 01/2017.  Saw Dr. Bridgett Larsson (Vasc) 04/12/17 and u/s showed some venous reflux dz but he felt her sx's in legs were NOT due to her venous insufficiency OR PAD.  Thigh high comp stockings rx'd.   Polycystic ovarian syndrome    Pruritic dermatitis 2015/2016   +Scabies prep at [facility] 07/2014. (Primarily pruritic skin, but eventually a  subtle rash as well)   Raynaud's phenomenon 2019   Rectal cancer ([place])    a. Followed by Dr. Gala Romney, dx 2000-2001. Tumor removed from rectal.   Recurrent sinusitis    +allergic rhinitis   Right shoulder pain 2017   Murphy/Wainer: RC bursitis + RC tear (MRI)--injection trial is plan per pt report 05/04/16.   Tobacco dependence    Vitamin D deficiency    Past Surgical History:  Procedure Laterality Date   ABI's complete Bilateral 09/30/2017   Normal ABIs.  Great toe pressures adequate for wound healing.  Femoral artery waveforms triphasic (normal).   CARDIOVASCULAR STRESS TEST  03/24/14   Lexscan MIBI: mild anterior ischemia?  Cardiac CT angiogram recommended/done.   CHOLECYSTECTOMY     COLONOSCOPY  05/05/2007   adenoma   coronary CT angio  03/29/14   Two vessel dz/moderate stenosis of mid LAD and proximal RCA; cath recommended.   DILATION AND CURETTAGE OF UTERUS     for vaginal bleeding   JOINT REPLACEMENT     LEFT HEART CATHETERIZATION WITH CORONARY ANGIOGRAM N/A 03/31/2014   Normal coronaries, EF 89-21%, diastolic dysfunction.  Procedure: LEFT HEART CATHETERIZATION WITH CORONARY ANGIOGRAM;  Surgeon: Sinclair Grooms, MD;  Location: [facility] CATH LAB;  Service: Cardiovascular;  Laterality: N/A;   LOWER EXT VENOUS DOPPLERS Bilateral 02/08/2017   NEG for DVT (bilat)   NCS/EMG  10/03/2017   S1 radiculopathy bilat   Overnight oximetry testing  09/2017   qualified for oxygen supplementation during sleep   PFTs  04/2016   No sign of COPD or asthma; FEV 1 worse after albut.   rectal mss  age 73   High-grade rectal adenoma removed from rectum    TRANSTHORACIC ECHOCARDIOGRAM  03/23/14; 10/21/17   2015 Normal.  2019: mild LVH, EF 65-70%, study technically inadequate to assess diastolic function, valves were fine, wall motion normal.     SOCIAL HISTORY:  Social History   Socioeconomic History   Marital status: Married    Spouse name: Not on file   Number of children: 1    Years of education: Not on file   Highest education level: Not on file  Occupational History   Occupation: own insurance co Engineering geologist: [employer]  Social Needs   Financial resource strain: Not hard at all   Food insecurity    Worry: Never true    Inability: Never true   Transportation needs    Medical: No    Non-medical: No  Tobacco Use   Smoking status: Current Every Day Smoker    Packs/day: 0.50    Years: 18.00    Pack years: 9.00    Types: Cigarettes   Smokeless tobacco: Never Used   Tobacco comment: 25 years  Substance and Sexual Activity   Alcohol use: No    Alcohol/week: 0.0 standard drinks   Drug use: No   Sexual  activity: Never    Birth control/protection: None, Surgical  Lifestyle   Physical activity    Days per week: 0 days    Minutes per session: 0 min   Stress: Very much  Relationships   Social connections    Talks on phone: Once a week    Gets together: Once a week    Attends religious service: Never    Active member of club or organization: No    Attends meetings of clubs or organizations: Never    Relationship status: Married   Intimate partner violence    Fear of current or ex partner: No    Emotionally abused: No    Physically abused: No    Forced sexual activity: No  Other Topics Concern   Not on file  Social History Narrative   Not on file    FAMILY HISTORY:  Family History  Problem Relation Age of Onset   Colon cancer Father 52   Cancer Father    Heart disease Mother    Hypertension Mother    Hyperlipidemia Mother    Mental illness Mother    Diabetes Mother    Heart attack Mother    Thyroid disease Mother    Colon cancer Maternal Grandfather    Stroke Neg Hx     CURRENT MEDICATIONS:  Outpatient Encounter Medications as of 10/08/2018  Medication Sig Note   albuterol (PROVENTIL) (2.5 MG/3ML) 0.083% nebulizer solution Take 3 mLs (2.5 mg total) by nebulization every 6 (six) hours as  needed for wheezing or shortness of breath.    albuterol (VENTOLIN HFA) 108 (90 Base) MCG/ACT inhaler INHALE 1 TO 2 PUFFS BY MOUTH EVERY 4 HOURS AS NEEDED    AMITIZA 24 MCG capsule Take 24 mcg by mouth 2 (two) times daily.    aspirin 81 MG chewable tablet Chew 1 tablet (81 mg total) by mouth daily.    azelastine (ASTELIN) 0.1 % nasal spray Place 2 sprays into both nostrils 2 (two) times daily.    busPIRone (BUSPAR) 15 MG tablet 1 in the morning and 2 at bedtime    cetirizine (ZYRTEC) 10 MG tablet TAKE 1 TABLET BY MOUTH ONCE DAILY    clonazePAM (KLONOPIN) 0.5 MG tablet TAKE 1 TABLET BY MOUTH DURING THE DAY AS NEEDED AND 2 AT BEDTIME    cyclobenzaprine (FLEXERIL) 10 MG tablet TAKE ONE TABLET BY MOUTH EVERY 6 HOURS AS NEEDED (SPASM RELATED TO HEADACHES) (Patient taking differently: Take 10 mg by mouth 3 (three) times daily. TAKE ONE TABLET BY MOUTH EVERY 6 HOURS AS NEEDED (SPASM RELATED TO HEADACHES))    diphenhydrAMINE (BENADRYL) 25 MG tablet Take 1 tablet (25 mg total) by mouth every 6 (six) hours. (Patient taking differently: Take 25-50 mg by mouth every 6 (six) hours as needed for itching or allergies. )    fluticasone (FLONASE) 50 MCG/ACT nasal spray Place 2 sprays into both nostrils daily.    hydrOXYzine (ATARAX/VISTARIL) 25 MG tablet Take 25 mg by mouth 3 (three) times daily.     IRON-FOLIC ACID PO Take 65 mg by mouth daily.     lamoTRIgine (LAMICTAL) 100 MG tablet Take 200 mg by mouth at bedtime.    losartan (COZAAR) 50 MG tablet Take 1 tablet by mouth once daily    LYRICA 150 MG capsule Take 1 tablet by mouth 3 (three) times daily.    Magnesium-Potassium 40-40 MG CAPS Take 1 capsule by mouth daily.    meloxicam (MOBIC) 15 MG tablet Take 15 mg by  mouth daily.    metoprolol tartrate (LOPRESSOR) 50 MG tablet Take 1 tablet by mouth twice daily    Naldemedine Tosylate (SYMPROIC) 0.2 MG TABS Take 1 tablet by mouth daily.    nitroGLYCERIN (NITROSTAT) 0.4 MG SL tablet Place 1  tablet (0.4 mg total) under the tongue every 5 (five) minutes as needed for chest pain.    oxyCODONE-acetaminophen (PERCOCET) 10-325 MG tablet Take 1 tablet by mouth 3 (three) times daily.  09/13/2017: Taking 3.5 tablets daily prn pain   pantoprazole (PROTONIX) 40 MG tablet Take 1 tablet (40 mg total) by mouth 2 (two) times daily.    pravastatin (PRAVACHOL) 40 MG tablet Take 1 tablet (40 mg total) by mouth daily.    predniSONE (DELTASONE) 5 MG tablet Take 1 tablet by mouth once daily with breakfast    pregabalin (LYRICA) 200 MG capsule Take 200 mg by mouth at bedtime.    promethazine (PHENERGAN) 25 MG tablet TAKE 1 TABLET BY MOUTH EVERY 6 HOURS AS NEEDED FOR NAUSEA AND VOMITING    rOPINIRole (REQUIP) 0.25 MG tablet TAKE 3 TABLETS BY MOUTH AT BEDTIME    [DISCONTINUED] citalopram (CELEXA) 40 MG tablet Take 20 mg by mouth at bedtime.     [DISCONTINUED] omeprazole (PRILOSEC) 20 MG capsule Take 40 mg by mouth daily.    No facility-administered encounter medications on file as of 10/08/2018.     ALLERGIES:  Allergies  Allergen Reactions   Contrast Media [Iodinated Diagnostic Agents] Anaphylaxis    Needs 13-hour prep   Shellfish Allergy Anaphylaxis   Codeine Nausea And Vomiting and Other (See Comments)    Hallucinations, Also sees people that are not there    Furosemide Rash    Rash, soB.   Anoro Ellipta [Umeclidinium-Vilanterol] Other (See Comments)    Pt states it caused blisters.   Ivermectin Nausea And Vomiting    N/V and rash   Spiriva Handihaler [Tiotropium Bromide Monohydrate] Other (See Comments)    Severe headaches   Betadine [Povidone Iodine] Rash   Latex Hives   Lipitor [Atorvastatin] Itching   Permethrin Itching   Stiolto Respimat [Tiotropium Bromide-Olodaterol] Other (See Comments)    Mouth ulcers   Tape Dermatitis    Paper tape and clear   Trelegy Ellipta [Fluticasone-Umeclidin-Vilant] Other (See Comments)    headaches     PHYSICAL EXAM:  ECOG  Performance status: 1  Vitals:   10/08/18 1532  BP: (!) 129/91  Pulse: (!) 105  Resp: 20  Temp: 97.8 F (36.6 C)  SpO2: 95%   Filed Weights   10/08/18 1532  Weight: 266 lb 12.8 oz (121 kg)    Physical Exam Vitals signs reviewed.  Constitutional:      Appearance: Normal appearance.  Cardiovascular:     Rate and Rhythm: Normal rate and regular rhythm.     Heart sounds: Normal heart sounds.  Pulmonary:     Effort: Pulmonary effort is normal.     Breath sounds: Normal breath sounds.  Abdominal:     General: There is no distension.     Palpations: Abdomen is soft. There is no mass.  Musculoskeletal:        General: No swelling.  Skin:    General: Skin is warm.  Neurological:     General: No focal deficit present.     Mental Status: She is alert and oriented to person, place, and time.  Psychiatric:        Mood and Affect: Mood normal.  Behavior: Behavior normal.      LABORATORY DATA:  I have reviewed the labs as listed.  CBC    Component Value Date/Time   WBC 9.8 09/25/2018 1433   RBC 4.97 09/25/2018 1433   HGB 14.3 09/25/2018 1433   HCT 45.5 09/25/2018 1433   PLT 265 09/25/2018 1433   MCV 91.5 09/25/2018 1433   MCH 28.8 09/25/2018 1433   MCHC 31.4 09/25/2018 1433   RDW 14.4 09/25/2018 1433   LYMPHSABS 3.3 09/25/2018 1433   MONOABS 0.5 09/25/2018 1433   EOSABS 0.1 09/25/2018 1433   BASOSABS 0.0 09/25/2018 1433   CMP Latest Ref Rng & Units 09/25/2018 09/24/2018 05/29/2017  Glucose 70 - 99 mg/dL 100(H) 86 114(H)  BUN 6 - 20 mg/dL '11 9 11  ' Creatinine 0.44 - 1.00 mg/dL 0.65 0.70 0.71  Sodium 135 - 145 mmol/L 139 137 136  Potassium 3.5 - 5.1 mmol/L 3.8 4.2 4.2  Chloride 98 - 111 mmol/L 103 102 101  CO2 22 - 32 mmol/L '25 28 22  ' Calcium 8.9 - 10.3 mg/dL 8.8(L) 8.6 9.6  Total Protein 6.5 - 8.1 g/dL 6.6 5.9(L) 7.7  Total Bilirubin 0.3 - 1.2 mg/dL 0.6 0.4 0.9  Alkaline Phos 38 - 126 U/L 81 82 71  AST 15 - 41 U/L 11(L) 8 21  ALT 0 - 44 U/L '15 12 17        ' DIAGNOSTIC IMAGING:  I have independently reviewed the scans and discussed with the patient.   I have reviewed Venita Lick LPN's note and agree with the documentation.  I personally performed a face-to-face visit, made revisions and my assessment and plan is as follows.    ASSESSMENT & PLAN:   Bone lesion 1.  Sclerotic bone lesions: - HRCT of the chest on 09/12/2018 showed innumerable tiny sclerotic lesions scattered throughout the thoracic spine, largest in T9 and L1 vertebral bodies. - I have reviewed CT scan of the abdomen and pelvis from 2017.  Lesions in the T9 and L1 were present but very slightly smaller. - Bone scan on 10/06/2018 did not show any evidence of metastatic disease. - Multiple myeloma work-up was also negative.  CEA was within normal limits. - I will see her back in 3 months with repeat BMP.  Differential diagnosis includes benign bone islands.  We can repeat CT of the spine in 6 to 12 months for stability. - She is having mammogram done this Friday.  2.  Rectal carcinoma in situ: -She had a history of carcinoma in situ in the rectal polyp which was removed in the early 2000.  Last colonoscopy on record was in 2009, tubular adenoma was removed. -We have made an appointment for her to see Dr. Buford Dresser for repeat colonoscopy.  This will likely be scheduled in August. -CEA was normal from recent labs.  3.  Smoking history: -She smoked 1 pack/day for 25 years, quit for 5 years and currently smoking about half pack per day.  She started smoking at age 71. -CT of the chest on 09/12/2018 did not show any lung lesions.   Total time spent is 25 minutes with more than 50% of the time spent face-to-face discussing scan results, blood work results, follow-up plan, counseling and coordination of care.  Orders placed this encounter:  Orders Placed This Encounter  Procedures   Basic metabolic panel      Derek Jack, MD [place] [phone]

## 2018-10-08 NOTE — Assessment & Plan Note (Signed)
1.  Sclerotic bone lesions: - HRCT of the chest on 09/12/2018 showed innumerable tiny sclerotic lesions scattered throughout the thoracic spine, largest in T9 and L1 vertebral bodies. - I have reviewed CT scan of the abdomen and pelvis from 2017.  Lesions in the T9 and L1 were present but very slightly smaller. - Bone scan on 10/06/2018 did not show any evidence of metastatic disease. - Multiple myeloma work-up was also negative.  CEA was within normal limits. - I will see her back in 3 months with repeat BMP.  Differential diagnosis includes benign bone islands.  We can repeat CT of the spine in 6 to 12 months for stability. - She is having mammogram done this Friday.  2.  Rectal carcinoma in situ: -She had a history of carcinoma in situ in the rectal polyp which was removed in the early 2000.  Last colonoscopy on record was in 2009, tubular adenoma was removed. -We have made an appointment for her to see Dr. Buford Dresser for repeat colonoscopy.  This will likely be scheduled in August. -CEA was normal from recent labs.  3.  Smoking history: -She smoked 1 pack/day for 25 years, quit for 5 years and currently smoking about half pack per day.  She started smoking at age 88. -CT of the chest on 09/12/2018 did not show any lung lesions.

## 2018-10-08 NOTE — Patient Instructions (Signed)
[facility] Discharge Instructions  You were seen today by Dr. Katragadda. He went over your recent lab and scan results. He will see you back in 3 months for labs and follow up.   Thank you for choosing [facility] at [facility] to provide your oncology and hematology care.  To afford each patient quality time with our provider, please arrive at least 15 minutes before your scheduled appointment time.   If you have a lab appointment with the Cancer Center please come in thru the  Main Entrance and check in at the main information desk  You need to re-schedule your appointment should you arrive 10 or more minutes late.  We strive to give you quality time with our providers, and arriving late affects you and other patients whose appointments are after yours.  Also, if you no show three or more times for appointments you may be dismissed from the clinic at the providers discretion.     Again, thank you for choosing [facility].  Our hope is that these requests will decrease the amount of time that you wait before being seen by our physicians.       _____________________________________________________________  Should you have questions after your visit to [facility], please contact our office at [phone] between the hours of 8:00 a.m. and 4:30 p.m.  Voicemails left after 4:00 p.m. will not be returned until the following business day.  For prescription refill requests, have your pharmacy contact our office and allow 72 hours.    Cancer Center Support Programs:   > Cancer Support Group  2nd Tuesday of the month 1pm-2pm, Journey Room    

## 2018-10-10 ENCOUNTER — Ambulatory Visit (HOSPITAL_COMMUNITY): Payer: Commercial Managed Care - PPO

## 2018-10-15 DIAGNOSIS — M25511 Pain in right shoulder: Secondary | ICD-10-CM | POA: Diagnosis not present

## 2018-10-15 DIAGNOSIS — Z79891 Long term (current) use of opiate analgesic: Secondary | ICD-10-CM | POA: Diagnosis not present

## 2018-10-15 DIAGNOSIS — F4310 Post-traumatic stress disorder, unspecified: Secondary | ICD-10-CM | POA: Diagnosis not present

## 2018-10-15 DIAGNOSIS — M79606 Pain in leg, unspecified: Secondary | ICD-10-CM | POA: Diagnosis not present

## 2018-10-15 DIAGNOSIS — G603 Idiopathic progressive neuropathy: Secondary | ICD-10-CM | POA: Diagnosis not present

## 2018-10-22 ENCOUNTER — Other Ambulatory Visit: Payer: Self-pay | Admitting: Pulmonary Disease

## 2018-10-23 ENCOUNTER — Ambulatory Visit (HOSPITAL_COMMUNITY)
Admission: RE | Admit: 2018-10-23 | Discharge: 2018-10-23 | Disposition: A | Discharge status: Home / Self Care | Payer: Commercial Managed Care - PPO | Source: Ambulatory Visit | Attending: Hematology | Admitting: Hematology

## 2018-10-23 ENCOUNTER — Other Ambulatory Visit: Payer: Self-pay

## 2018-10-23 DIAGNOSIS — Z1231 Encounter for screening mammogram for malignant neoplasm of breast: Secondary | ICD-10-CM | POA: Diagnosis not present

## 2018-10-26 ENCOUNTER — Other Ambulatory Visit: Payer: Self-pay | Admitting: Family Medicine

## 2018-10-27 ENCOUNTER — Encounter (HOSPITAL_COMMUNITY): Payer: Self-pay | Admitting: Physical Therapy

## 2018-10-27 ENCOUNTER — Other Ambulatory Visit: Payer: Self-pay

## 2018-10-27 ENCOUNTER — Ambulatory Visit (HOSPITAL_COMMUNITY): Payer: Commercial Managed Care - PPO | Attending: Neurology | Admitting: Physical Therapy

## 2018-10-27 DIAGNOSIS — M6281 Muscle weakness (generalized): Secondary | ICD-10-CM

## 2018-10-27 DIAGNOSIS — G8929 Other chronic pain: Secondary | ICD-10-CM

## 2018-10-27 DIAGNOSIS — R29898 Other symptoms and signs involving the musculoskeletal system: Secondary | ICD-10-CM

## 2018-10-27 DIAGNOSIS — M5442 Lumbago with sciatica, left side: Secondary | ICD-10-CM | POA: Diagnosis not present

## 2018-10-27 DIAGNOSIS — R262 Difficulty in walking, not elsewhere classified: Secondary | ICD-10-CM

## 2018-10-27 DIAGNOSIS — M5441 Lumbago with sciatica, right side: Secondary | ICD-10-CM | POA: Insufficient documentation

## 2018-10-27 NOTE — Telephone Encounter (Signed)
RF request for prednisone and clonazepam  Last OV 09/24/2018 Next OV 12/02/2018  Last RX for clonazepam 03/27/2018 # 90 x 5 rfs. Last RX for prednisone was 09/25/2018 # 30 No RF.  Please advise.

## 2018-10-27 NOTE — Patient Instructions (Signed)
Meditation exercise:  You can use apps like Calm and/or Headspace for targeted meditation practice that is guided.  Take 1-2 minutes out of your day to meditate, 3-5 times per day.     Transverse Abdominus Activation  Lying on your back, pull your bellybutton into your spine.   Hold for 3 seconds, then relax.  Repeat 10 times, 2-3 times per day.    Ruth  While lying on your back, squeeze your buttocks and hold for a count of 3.  Repeat 10 times, 2-3 times per day.     SEATED MARCHING  While seated in a chair, lift up your foot and knee, set it down and then perform on the other leg. Make sure you are squeezing your core tight during this exercise.  Repeat 3-5 times each leg, 2-3 times per day.

## 2018-10-27 NOTE — Therapy (Signed)
[place], [place], [address] Phone: [phone]   Fax:  [phone]  Physical Therapy Evaluation  Patient Details  Name: Martha Espinoza MRN: [id number] Date of Birth: [birth year] Referring Provider (PT): Barton Fanny    Encounter Date: 10/27/2018  PT End of Session - 10/27/18 1417    Visit Number  1    Number of Visits  7    Date for PT Re-Evaluation  11/17/18    Authorization Type  [employer] PPO    Authorization Time Period  10/27/18 to 11/17/18    Authorization - Visit Number  1    Authorization - Number of Visits  10    PT Start Time  1320    PT Stop Time  6283    PT Time Calculation (min)  43 min    Activity Tolerance  Patient limited by pain;Patient tolerated treatment well    Behavior During Therapy  [facility] for tasks assessed/performed;Anxious       Past Medical History:  Diagnosis Date  . Abnormal CT of thoracic spine 09/12/2018   Noncontrast.  Increased in number and TINY increase in size of tiny, patchy sclerotic T spine lesions compared to 2018 CT.  Hem/onc referral recommended by reading radiologist to eval for marrow infiltrative process.  . Arthritis   . Asthma   . Bipolar 1 disorder ([name])   . CAD (coronary artery disease) 08/2018   1 V CAD noted on noncontrast CT chest 08/2018  . Cancer [facility])    Rectal Cancer 2000  . Chronic pain syndrome    chronic low back pain (Pain mgmt= Dr. [name]).  MRI showed no signif disc dz, only showed some facet arthropathy at L4-5, with joint diastasis at L4-5--plan as of 11/07/16 neurosurg eval is bilat facet injections.  . Colitis due to Clostridium difficile 2001  . COPD (chronic obstructive pulmonary disease) ([place])    PFTs 04/2016 showed no sign of copd or asthma, plus she actually had worse FEV1 after taking albuterol.  However, as of 06/2018 she has recent hx of improvement in sx's after taking albuterol.  LONG TIME SMOKER, won't quit.  Intolerant to or cannot  afford: pulmicort, QVAR, advair, symbicort, flovent, bevespi, and spiriva (as of 06/2018).  . Depression   . GAD (generalized anxiety disorder)   . GERD (gastroesophageal reflux disease)   . Hepatic hemangioma   . Hepatic steatosis 06/2016   Noted incidentally on CT chest  . History of Salmonella gastroenteritis   . Hyperlipidemia    a. Noted 02/2014.  Marland Kitchen Hypertension    HCTZ started 07/2015  . IBS (irritable bowel syndrome)   . Iron deficiency anemia 05/2014   per pt it is not due to vaginal blood loss; hemoccults sent to pt in mail 413/16.  . Lumbar spondylosis    mild, mainly focused at L4-5 and L5-S1.  MRI L spine 09/09/17-->mild degenerative disc disease and facet arthrosis without any nerve root encroachment, no spinal stenosis.  . Microscopic hematuria    Intermittent (no w/u done yet, as of 03/2014)  . Migraine headache   . Morbid obesity ([name])   . Multiple lipomas 09/2016   left upper arm--very small ones.  . Neuromuscular disorder ([place])   . Nocturnal hypoxia 09/18/2017   Overnight oximetry -->qualified: ordered 2L oxygen during sleep.  . Obesity hypoventilation syndrome ([place])   . Pain in both lower legs 2018-2019   pain and numbness bilat LLs.  NCS/EMGs findings suggestive of  S1 radiculopathy bilat: sx's + NCS findings suggestive of neurogenic claudication/spinal stenosis.  ? contribution of venous reflux?  . Peripheral edema Fall 2018   R>L, non-pitting for the most part--venous doppler neg for DVT 01/2017.  Saw Dr. Bridgett Larsson (Vasc) 04/12/17 and u/s showed some venous reflux dz but he felt her sx's in legs were NOT due to her venous insufficiency OR PAD.  Thigh high comp stockings rx'd.  . Polycystic ovarian syndrome   . Pruritic dermatitis 2015/2016   +Scabies prep at [facility] 07/2014. (Primarily pruritic skin, but eventually a subtle rash as well)  . Raynaud's phenomenon 2019  . Rectal cancer ([place])    a. Followed by Dr. Gala Romney, dx 2000-2001. Tumor removed from rectal.   . Recurrent sinusitis    +allergic rhinitis  . Right shoulder pain 2017   Murphy/Wainer: RC bursitis + RC tear (MRI)--injection trial is plan per pt report 05/04/16.  . Tobacco dependence   . Vitamin D deficiency     Past Surgical History:  Procedure Laterality Date  . ABI's complete Bilateral 09/30/2017   Normal ABIs.  Great toe pressures adequate for wound healing.  Femoral artery waveforms triphasic (normal).  . CARDIOVASCULAR STRESS TEST  03/24/14   Lexscan MIBI: mild anterior ischemia?  Cardiac CT angiogram recommended/done.  . CHOLECYSTECTOMY    . COLONOSCOPY  05/05/2007   adenoma  . coronary CT angio  03/29/14   Two vessel dz/moderate stenosis of mid LAD and proximal RCA; cath recommended.  Marland Kitchen DILATION AND CURETTAGE OF UTERUS     for vaginal bleeding  . JOINT REPLACEMENT    . LEFT HEART CATHETERIZATION WITH CORONARY ANGIOGRAM N/A 03/31/2014   Normal coronaries, EF 54-62%, diastolic dysfunction.  Procedure: LEFT HEART CATHETERIZATION WITH CORONARY ANGIOGRAM;  Surgeon: Sinclair Grooms, MD;  Location: [facility] CATH LAB;  Service: Cardiovascular;  Laterality: N/A;  . LOWER EXT VENOUS DOPPLERS Bilateral 02/08/2017   NEG for DVT (bilat)  . NCS/EMG  10/03/2017   S1 radiculopathy bilat  . Overnight oximetry testing  09/2017   qualified for oxygen supplementation during sleep  . PFTs  04/2016   No sign of COPD or asthma; FEV 1 worse after albut.  . rectal mss  age 53   High-grade rectal adenoma removed from rectum   . TRANSTHORACIC ECHOCARDIOGRAM  03/23/14; 10/21/17   2015 Normal.  2019: mild LVH, EF 65-70%, study technically inadequate to assess diastolic function, valves were fine, wall motion normal.    There were no vitals filed for this visit.   Subjective Assessment - 10/27/18 1323    Subjective  The back has been going on for years; its really bad in the low back on the right. My spine hurts all the time all the way down to my tailbone. My tailbone was hurt in a fall I had and it  is still telling my brain its hurt but my back should not be hurting as much as I am. I had a lot of bone islands which is usually associated with cancer, I had cancer 20 years ago but nothing now that they can find, I've been told bone islands don't hurt.    How long can you sit comfortably?  not at all    How long can you stand comfortably?  5 minutes    How long can you walk comfortably?  not at all, can walk short distances but long distances like [employer] I'd need a WC    Patient Stated Goals  reduce  pain, be able to do more with grand-daughter    Currently in Pain?  Yes    Pain Score  5     Pain Location  Back    Pain Orientation  Right;Lower    Pain Descriptors / Indicators  Tingling;Numbness;Pins and needles    Pain Type  Chronic pain    Pain Radiating Towards  down both legs, R is worse and is whole leg, L is not as bad and is only down to knee    Pain Onset  More than a month ago    Pain Frequency  Constant    Aggravating Factors   everything    Pain Relieving Factors  medications, not a lot otherwise makes it better    Effect of Pain on Daily Activities  severe         [facility] PT Assessment - 10/27/18 0001      Assessment   Medical Diagnosis  back pain     Referring Provider (PT)  Barton Fanny     Onset Date/Surgical Date  --   years ago    Next MD Visit  September 2020     Prior Therapy  years ago for her back at this clinic       Precautions   Precautions  None      Restrictions   Weight Bearing Restrictions  Yes      Balance Screen   Has the patient fallen in the past 6 months  No    Has the patient had a decrease in activity level because of a fear of falling?   Yes    Is the patient reluctant to leave their home because of a fear of falling?   Yes      [place] residence      Prior Function   Level of Independence  Independent;Independent with basic ADLs      Observation/Other Assessments   Observations  clonus (+)  R LE, babinski no reaction B LEs; unable to elicit reflexes B knees        Sensation   Additional Comments  R LE grossly impaired especially L2 and L4 dermatomes       AROM   Lumbar Flexion  50% limited, uses hands to push up legs     Lumbar Extension  severe limitation, most of motion comes from hips rather than spine     Lumbar - Right Side Bend  moderate limitations     Lumbar - Left Side Bend  moderate limitations       Strength   Right Hip Flexion  2/5    Right Hip ABduction  3/5    Left Hip Flexion  2/5    Left Hip ABduction  3/5    Right Knee Flexion  2+/5    Right Knee Extension  3-/5    Left Knee Flexion  2+/5    Left Knee Extension  3-/5    Right Ankle Dorsiflexion  3/5    Left Ankle Dorsiflexion  3/5                Objective measurements completed on examination: See above findings.      [place] Adult PT Treatment/Exercise - 10/27/18 0001      Exercises   Exercises  Lumbar      Lumbar Exercises: Seated   Other Seated Lumbar Exercises  TA activation with 3 second holds, TA set with marches 1x6, glute sets 1x10  PT Education - 10/27/18 1415    Education Details  exam findings, possible benefit of participating in skilled PT services, POC, HEP; benefits of meditation and apps such as Calm and Headspace; role of anxiety/stress and lack of sleep in driving exacerbations of back pain    Person(s) Educated  Patient    Methods  Explanation;Handout;Demonstration    Comprehension  Verbalized understanding;Need further instruction       PT Short Term Goals - 10/27/18 1420      PT SHORT TERM GOAL #1   Title  Patient to be able to verbalize at least 3-5 concepts of sleep hygiene and to report she has been able to sleep for at least 3 hours without waking due to pain    Time  3    Period  Weeks    Status  New    Target Date  11/17/18      PT SHORT TERM GOAL #2   Title  Patient to be able to verbalize basic concepts of pain science  education in order to assist in self-management and understanding of back pain    Time  3    Period  Weeks    Status  New      PT SHORT TERM GOAL #3   Title  Patient to report pain as being no more than 3/10 in order to improve QOL and functional task tolerance    Time  3    Period  Weeks    Status  New      PT SHORT TERM GOAL #4   Title  MMT to have improved by 1/2 grade in order to assist in improving functional mobility and reducing pain    Time  3    Period  Weeks    Status  New      PT SHORT TERM GOAL #5   Title  Patient to demonstrate correct functional mechanics for bed mobility and floor to stand lifting in order to assist in preventing exacerbation of back pain    Time  3    Period  Weeks    Status  New        PT Long Term Goals - 10/27/18 1424      PT LONG TERM GOAL #1   Title  No LTGs appropriate at this time- need to assess tolerance to and progress with PT, will update PRN             Plan - 10/27/18 1418    Clinical Impression Statement  Ms. Mcnelly arrives today reporting severe back pain which is not relieved in any position; she became tearful during the session when discussing how her back pain severely limits her from playing with her grand-daughter. Examination reveals poor posture, severe functional muscle weakness, gait impairment, poor mobility of lumbar spine, and poor functional task tolerance due to pain. Of note, unable to elicit bilateral patellar or Babinski reflexes, but she did appear to have a positive clonus test R LE. She also describes an extremely stressful period of her life which has left her with ongoing issues with severe anxiety and stress, which may also appears to potentially be influencing the severity of patient's back pain. She may benefit from a trial of skilled PT services to address functional deficits, reduce pain, and assist in promoting pain science education moving forward.    Personal Factors and Comorbidities   Age;Fitness;Behavior Pattern;Past/Current Experience;Comorbidity 3+;Sex;Social Background;Education;Finances;Time since onset of injury/illness/exacerbation    Comorbidities  obesity, bipolar,  duration of back pain    Examination-Activity Limitations  Transfers;Locomotion Level;Bed Mobility;Reach Overhead;Bend;Caring for Others;Sit;Sleep;Squat;Stairs;Stand;Lift    Examination-Participation Restrictions  Church;Medication Management;Yard Work;Cleaning;Meal Prep;Community Activity;Personal Finances;Driving;School;Interpersonal Relationship;Shop;Laundry;Volunteer    Stability/Clinical Decision Making  Evolving/Moderate complexity    Clinical Decision Making  High    Rehab Potential  Fair    PT Frequency  2x / week    PT Duration  3 weeks    PT Treatment/Interventions  ADLs/Self Care Home Management;Aquatic Therapy;Biofeedback;Cryotherapy;Electrical Stimulation;Iontophoresis 4mg /ml Dexamethasone;Moist Heat;Traction;Ultrasound;DME Instruction;Gait training;Stair training;Functional mobility training;Therapeutic activities;Therapeutic exercise;Balance training;Neuromuscular re-education;Patient/family education;Passive range of motion;Manual techniques;Dry needling;Energy conservation;Taping;Visual/perceptual remediation/compensation;Spinal Manipulations;Joint Manipulations    PT Next Visit Plan  Need to do FOTO (did not have time at eval). Review HEP. Continue pain science based approach, give sleep hygiene handout. Gentle exercise and activity as tolerated. Consider exericse on heat or with TENS at first.    PT Home Exercise Plan  Eval: meditation routine, glute sets, TA sets, TA sets with march    Consulted and Agree with Plan of Care  Patient       Patient will benefit from skilled therapeutic intervention in order to improve the following deficits and impairments:  Abnormal gait, Decreased coordination, Decreased range of motion, Difficulty walking, Increased fascial restricitons, Decreased endurance,  Decreased activity tolerance, Impaired perceived functional ability, Pain, Decreased balance, Impaired flexibility, Hypomobility, Improper body mechanics, Decreased mobility, Decreased strength, Impaired sensation, Postural dysfunction  Visit Diagnosis: 1. Chronic bilateral low back pain with bilateral sciatica   2. Muscle weakness (generalized)   3. Difficulty in walking, not elsewhere classified   4. Other symptoms and signs involving the musculoskeletal system        Problem List Patient Active Problem List   Diagnosis Date Noted  . Bone lesion 09/25/2018  . Nocturnal hypoxia 10/04/2017  . Leg pain, bilateral 07/15/2017  . Chronic venous insufficiency 04/12/2017  . Acute respiratory failure with hypoxia ([place]) 10/18/2015  . COPD exacerbation ([place]) 10/18/2015  . Drug reaction 07/23/2015  . Fever of unknown origin 07/22/2015  . Lactic acidosis 07/22/2015  . Hypokalemia 07/22/2015  . Nausea and vomiting 07/22/2015  . Chronic back pain 07/22/2015  . Abdominal pain 07/26/2014  . Nausea without vomiting 07/26/2014  . History of colon cancer 07/26/2014  . Iron deficiency anemia 07/07/2014  . Vitamin D deficiency 07/07/2014  . Abnormal nuclear stress test 03/31/2014  . Abnormal cardiac CT angiography 03/31/2014  . Hyperlipidemia 03/24/2014  . Morbid obesity ([place])   . Chest pain of uncertain etiology 58/85/0277  . COPD (chronic obstructive pulmonary disease) ([place]) 03/23/2014  . Leukocytosis 03/23/2014  . Hyperglycemia 03/23/2014  . Bipolar 1 disorder ([place]) 03/23/2014  . Pruritic rash 03/23/2014  . Chronic anxiety 03/23/2014  . Tobacco abuse 03/23/2014  . Intractable migraine without aura and with status migrainosus 01/14/2014  . Sinusitis 07/02/2013  . Urinary frequency 07/02/2013  . Migraine 07/02/2013  . Anxiety state, unspecified 04/14/2013  . Obesity 04/14/2013  . Anemia 04/14/2013  . CONSTIPATION 10/12/2009  . CARCINOMA IN SITU OF RECTUM 08/25/2009  . GERD  08/25/2009  . IRRITABLE BOWEL SYNDROME 08/25/2009  . DIARRHEA, CHRONIC 08/25/2009  . HEMANGIOMA, HEPATIC 08/24/2009  . POLYCYSTIC OVARIAN DISEASE 08/24/2009  . Personal history of other diseases of digestive system 08/24/2009    Deniece Ree PT, DPT, CBIS  Supplemental Physical Therapist [place]    Pager [phone] Acute Rehab Office [place] [address] [place], [place], [address] Phone: [phone]  Fax:  [phone]  Name: CAILEY TRIGUEROS MRN: [id number] Date of Birth: [birth year]

## 2018-10-30 ENCOUNTER — Other Ambulatory Visit: Payer: Self-pay

## 2018-10-30 ENCOUNTER — Ambulatory Visit (HOSPITAL_COMMUNITY): Payer: Commercial Managed Care - PPO

## 2018-10-30 ENCOUNTER — Encounter (HOSPITAL_COMMUNITY): Payer: Self-pay

## 2018-10-30 DIAGNOSIS — R29898 Other symptoms and signs involving the musculoskeletal system: Secondary | ICD-10-CM

## 2018-10-30 DIAGNOSIS — M5442 Lumbago with sciatica, left side: Secondary | ICD-10-CM

## 2018-10-30 DIAGNOSIS — G8929 Other chronic pain: Secondary | ICD-10-CM

## 2018-10-30 DIAGNOSIS — M5441 Lumbago with sciatica, right side: Secondary | ICD-10-CM | POA: Diagnosis not present

## 2018-10-30 DIAGNOSIS — M6281 Muscle weakness (generalized): Secondary | ICD-10-CM

## 2018-10-30 DIAGNOSIS — R262 Difficulty in walking, not elsewhere classified: Secondary | ICD-10-CM | POA: Diagnosis not present

## 2018-10-30 NOTE — Therapy (Signed)
[place], [place], [address] Phone: [phone]   Fax:  [phone]  Physical Therapy Treatment  Patient Details  Name: Martha Espinoza MRN: [id number] Date of Birth: September 10, 1965 Referring Provider (PT): Barton Fanny    Encounter Date: 10/30/2018  PT End of Session - 10/30/18 1424    Visit Number  2    Number of Visits  7    Date for PT Re-Evaluation  11/17/18    Authorization Type  [employer] PPO    Authorization Time Period  10/27/18 to 11/17/18    Authorization - Visit Number  2    Authorization - Number of Visits  10    PT Start Time  2878    PT Stop Time  1500    PT Time Calculation (min)  40 min    Activity Tolerance  Patient limited by pain;Patient tolerated treatment well    Behavior During Therapy  [facility] for tasks assessed/performed;Anxious       Past Medical History:  Diagnosis Date  . Abnormal CT of thoracic spine 09/12/2018   Noncontrast.  Increased in number and TINY increase in size of tiny, patchy sclerotic T spine lesions compared to 2018 CT.  Hem/onc referral recommended by reading radiologist to eval for marrow infiltrative process.  . Arthritis   . Asthma   . Bipolar 1 disorder ([place])   . CAD (coronary artery disease) 08/2018   1 V CAD noted on noncontrast CT chest 08/2018  . Cancer [facility])    Rectal Cancer 2000  . Chronic pain syndrome    chronic low back pain (Pain mgmt= Dr. [name]).  MRI showed no signif disc dz, only showed some facet arthropathy at L4-5, with joint diastasis at L4-5--plan as of 11/07/16 neurosurg eval is bilat facet injections.  . Colitis due to Clostridium difficile 2001  . COPD (chronic obstructive pulmonary disease) ([place])    PFTs 04/2016 showed no sign of copd or asthma, plus she actually had worse FEV1 after taking albuterol.  However, as of 06/2018 she has recent hx of improvement in sx's after taking albuterol.  LONG TIME SMOKER, won't quit.  Intolerant to or cannot  afford: pulmicort, QVAR, advair, symbicort, flovent, bevespi, and spiriva (as of 06/2018).  . Depression   . GAD (generalized anxiety disorder)   . GERD (gastroesophageal reflux disease)   . Hepatic hemangioma   . Hepatic steatosis 06/2016   Noted incidentally on CT chest  . History of Salmonella gastroenteritis   . Hyperlipidemia    a. Noted 02/2014.  Marland Kitchen Hypertension    HCTZ started 07/2015  . IBS (irritable bowel syndrome)   . Iron deficiency anemia 05/2014   per pt it is not due to vaginal blood loss; hemoccults sent to pt in mail 413/16.  . Lumbar spondylosis    mild, mainly focused at L4-5 and L5-S1.  MRI L spine 09/09/17-->mild degenerative disc disease and facet arthrosis without any nerve root encroachment, no spinal stenosis.  . Microscopic hematuria    Intermittent (no w/u done yet, as of 03/2014)  . Migraine headache   . Morbid obesity ([place])   . Multiple lipomas 09/2016   left upper arm--very small ones.  . Neuromuscular disorder ([place])   . Nocturnal hypoxia 09/18/2017   Overnight oximetry -->qualified: ordered 2L oxygen during sleep.  . Obesity hypoventilation syndrome ([place])   . Pain in both lower legs 2018-2019   pain and numbness bilat LLs.  NCS/EMGs findings suggestive of  S1 radiculopathy bilat: sx's + NCS findings suggestive of neurogenic claudication/spinal stenosis.  ? contribution of venous reflux?  . Peripheral edema Fall 2018   R>L, non-pitting for the most part--venous doppler neg for DVT 01/2017.  Saw Dr. Bridgett Larsson (Vasc) 04/12/17 and u/s showed some venous reflux dz but he felt her sx's in legs were NOT due to her venous insufficiency OR PAD.  Thigh high comp stockings rx'd.  . Polycystic ovarian syndrome   . Pruritic dermatitis 2015/2016   +Scabies prep at [facility] 07/2014. (Primarily pruritic skin, but eventually a subtle rash as well)  . Raynaud's phenomenon 2019  . Rectal cancer ([place])    a. Followed by Dr. Gala Romney, dx 2000-2001. Tumor removed from rectal.   . Recurrent sinusitis    +allergic rhinitis  . Right shoulder pain 2017   [facility]: RC bursitis + RC tear (MRI)--injection trial is plan per pt report 05/04/16.  . Tobacco dependence   . Vitamin D deficiency     Past Surgical History:  Procedure Laterality Date  . ABI's complete Bilateral 09/30/2017   Normal ABIs.  Great toe pressures adequate for wound healing.  Femoral artery waveforms triphasic (normal).  . CARDIOVASCULAR STRESS TEST  03/24/14   Lexscan MIBI: mild anterior ischemia?  Cardiac CT angiogram recommended/done.  . CHOLECYSTECTOMY    . COLONOSCOPY  05/05/2007   adenoma  . coronary CT angio  03/29/14   Two vessel dz/moderate stenosis of mid LAD and proximal RCA; cath recommended.  Marland Kitchen DILATION AND CURETTAGE OF UTERUS     for vaginal bleeding  . JOINT REPLACEMENT    . LEFT HEART CATHETERIZATION WITH CORONARY ANGIOGRAM N/A 03/31/2014   Normal coronaries, EF 34-19%, diastolic dysfunction.  Procedure: LEFT HEART CATHETERIZATION WITH CORONARY ANGIOGRAM;  Surgeon: Sinclair Grooms, MD;  Location: [facility] CATH LAB;  Service: Cardiovascular;  Laterality: N/A;  . LOWER EXT VENOUS DOPPLERS Bilateral 02/08/2017   NEG for DVT (bilat)  . NCS/EMG  10/03/2017   S1 radiculopathy bilat  . Overnight oximetry testing  09/2017   qualified for oxygen supplementation during sleep  . PFTs  04/2016   No sign of COPD or asthma; FEV 1 worse after albut.  . rectal mss  age 52   High-grade rectal adenoma removed from rectum   . TRANSTHORACIC ECHOCARDIOGRAM  03/23/14; 10/21/17   2015 Normal.  2019: mild LVH, EF 65-70%, study technically inadequate to assess diastolic function, valves were fine, wall motion normal.    There were no vitals filed for this visit.  Subjective Assessment - 10/30/18 1420    Subjective  Pt arrived reporting "her legs feel like jello today."  LBP scale 9/10, increases following the exercises and wakes up during the night.    Currently in Pain?  Yes    Pain Score  9      Pain Location  Back    Pain Orientation  Lower    Pain Descriptors / Indicators  Burning;Tingling;Throbbing   "tooth ache"   Pain Type  Chronic pain    Pain Radiating Towards  down both legs, Rt in worse and is whole leg, Lt is not as bad and is only down to knee    Pain Onset  More than a month ago    Pain Frequency  Constant    Aggravating Factors   everything    Pain Relieving Factors  medication, not a lot otherwise, makes it better, semi-recumbent positon in bed    Effect of Pain on Daily  Activities  severe         [facility] PT Assessment - 10/30/18 0001      Assessment   Medical Diagnosis  back pain     Referring Provider (PT)  Barton Fanny     Onset Date/Surgical Date  --   years ago   Next MD Visit  September 2020     Prior Therapy  years ago for her back at this clinic       Observation/Other Assessments   Focus on Therapeutic Outcomes (FOTO)   84% limited                   [facility] Adult PT Treatment/Exercise - 10/30/18 0001      Bed Mobility   Bed Mobility  Left Sidelying to Sit;Rolling Left    Rolling Left  Supervision/Verbal cueing   log rolling x 2 sets   Left Sidelying to Sit  Supervision/Verbal cueing   Log rolling x 2 set     Exercises   Exercises  Lumbar      Lumbar Exercises: Seated   Other Seated Lumbar Exercises  TA activation with 3 second holds, TA set with marches 1x6, glute sets 1x10       Lumbar Exercises: Supine   Ab Set  10 reps;3 seconds    AB Set Limitations  TA activation with 3 second holds, TA set with marches 1x6, glute sets 1x10     Glut Set  10 reps;3 seconds    Bent Knee Raise  10 reps;3 seconds      Modalities   Modalities  Moist Heat      Moist Heat Therapy   Number Minutes Moist Heat  15 Minutes    Moist Heat Location  Lumbar Spine   during supine therex            PT Education - 10/30/18 1509    Education Details  Reviewed goals, compliance wiht HEP, FOTO complete, reviewed benefits of meditation,  educated on proper bed mechanics (log rolling) and sleep hygiene discussed wiht printout given.    Person(s) Educated  Patient    Methods  Explanation;Demonstration;Handout;Verbal cues    Comprehension  Verbalized understanding;Returned demonstration;Verbal cues required       PT Short Term Goals - 10/27/18 1420      PT SHORT TERM GOAL #1   Title  Patient to be able to verbalize at least 3-5 concepts of sleep hygiene and to report she has been able to sleep for at least 3 hours without waking due to pain    Time  3    Period  Weeks    Status  New    Target Date  11/17/18      PT SHORT TERM GOAL #2   Title  Patient to be able to verbalize basic concepts of pain science education in order to assist in self-management and understanding of back pain    Time  3    Period  Weeks    Status  New      PT SHORT TERM GOAL #3   Title  Patient to report pain as being no more than 3/10 in order to improve QOL and functional task tolerance    Time  3    Period  Weeks    Status  New      PT SHORT TERM GOAL #4   Title  MMT to have improved by 1/2 grade in order to assist in improving functional mobility and  reducing pain    Time  3    Period  Weeks    Status  New      PT SHORT TERM GOAL #5   Title  Patient to demonstrate correct functional mechanics for bed mobility and floor to stand lifting in order to assist in preventing exacerbation of back pain    Time  3    Period  Weeks    Status  New        PT Long Term Goals - 10/27/18 1424      PT LONG TERM GOAL #1   Title  No LTGs appropriate at this time- need to assess tolerance to and progress with PT, will update PRN            Plan - 10/30/18 1443    Clinical Impression Statement  Reviewed goals, assured compliance with HEP and FOTO complete.  Pt reports increased pain following HEP at home, reviewed form with some cueing for breathing paired with isometric abdominal contraction.  Pt demonstrates weak musculature with inability  to contract correct mm without compensation of holding breath.  Discussed completing exercises wiht meditation music and focusing on breathing prior to reduce anxiety wiht exercise.  Pt educated that beginning new exercises may cause some productive soreness as working on strengthening.  Encouraged pt to continue wiht exercises but to reduce reps and progress as able with verbalized understanding.  Pt educated on proper bed mobility to reduce strain on lumbar mm, 2 sets complete with good form following cueing.  Pt encouraged to bring step stool to assist with getting into bed as it is high.  MHP used with supine exercises for pain control.  Pt educated on sleep hygiene methods to improve routine to assist with sleeping through night, given handout to review at home.  Pt will continue to require a lot of pain science next session.    Personal Factors and Comorbidities  Age;Fitness;Behavior Pattern;Past/Current Experience;Comorbidity 3+;Sex;Social Background;Education;Finances;Time since onset of injury/illness/exacerbation    Comorbidities  obesity, bipolar, duration of back pain    Examination-Activity Limitations  Transfers;Locomotion Level;Bed Mobility;Reach Overhead;Bend;Caring for Others;Sit;Sleep;Squat;Stairs;Stand;Lift    Examination-Participation Restrictions  Church;Medication Management;Yard Work;Cleaning;Meal Prep;Community Activity;Personal Finances;Driving;School;Interpersonal Relationship;Shop;Laundry;Volunteer    Stability/Clinical Decision Making  Evolving/Moderate complexity    Clinical Decision Making  High    Rehab Potential  Fair    PT Frequency  2x / week    PT Duration  3 weeks    PT Treatment/Interventions  ADLs/Self Care Home Management;Aquatic Therapy;Biofeedback;Cryotherapy;Electrical Stimulation;Iontophoresis 4mg /ml Dexamethasone;Moist Heat;Traction;Ultrasound;DME Instruction;Gait training;Stair training;Functional mobility training;Therapeutic activities;Therapeutic  exercise;Balance training;Neuromuscular re-education;Patient/family education;Passive range of motion;Manual techniques;Dry needling;Energy conservation;Taping;Visual/perceptual remediation/compensation;Spinal Manipulations;Joint Manipulations    PT Next Visit Plan  Review HEP and sleep hygiene next session. Continue pain science based approach, give sleep hygiene handout. Gentle exercise and activity as tolerated. Consider exericse on heat or with TENS at first.    PT Home Exercise Plan  Eval: meditation routine, glute sets, TA sets, TA sets with march       Patient will benefit from skilled therapeutic intervention in order to improve the following deficits and impairments:  Abnormal gait, Decreased coordination, Decreased range of motion, Difficulty walking, Increased fascial restricitons, Decreased endurance, Decreased activity tolerance, Impaired perceived functional ability, Pain, Decreased balance, Impaired flexibility, Hypomobility, Improper body mechanics, Decreased mobility, Decreased strength, Impaired sensation, Postural dysfunction  Visit Diagnosis: 1. Difficulty in walking, not elsewhere classified   2. Other symptoms and signs involving the musculoskeletal system   3. Muscle weakness (generalized)  4. Chronic bilateral low back pain with bilateral sciatica        Problem List Patient Active Problem List   Diagnosis Date Noted  . Bone lesion 09/25/2018  . Nocturnal hypoxia 10/04/2017  . Leg pain, bilateral 07/15/2017  . Chronic venous insufficiency 04/12/2017  . Acute respiratory failure with hypoxia ([place]) 10/18/2015  . COPD exacerbation ([place]) 10/18/2015  . Drug reaction 07/23/2015  . Fever of unknown origin 07/22/2015  . Lactic acidosis 07/22/2015  . Hypokalemia 07/22/2015  . Nausea and vomiting 07/22/2015  . Chronic back pain 07/22/2015  . Abdominal pain 07/26/2014  . Nausea without vomiting 07/26/2014  . History of colon cancer 07/26/2014  . Iron deficiency  anemia 07/07/2014  . Vitamin D deficiency 07/07/2014  . Abnormal nuclear stress test 03/31/2014  . Abnormal cardiac CT angiography 03/31/2014  . Hyperlipidemia 03/24/2014  . Morbid obesity ([place])   . Chest pain of uncertain etiology 22/57/5051  . COPD (chronic obstructive pulmonary disease) ([place]) 03/23/2014  . Leukocytosis 03/23/2014  . Hyperglycemia 03/23/2014  . Bipolar 1 disorder ([place]) 03/23/2014  . Pruritic rash 03/23/2014  . Chronic anxiety 03/23/2014  . Tobacco abuse 03/23/2014  . Intractable migraine without aura and with status migrainosus 01/14/2014  . Sinusitis 07/02/2013  . Urinary frequency 07/02/2013  . Migraine 07/02/2013  . Anxiety state, unspecified 04/14/2013  . Obesity 04/14/2013  . Anemia 04/14/2013  . CONSTIPATION 10/12/2009  . CARCINOMA IN SITU OF RECTUM 08/25/2009  . GERD 08/25/2009  . IRRITABLE BOWEL SYNDROME 08/25/2009  . DIARRHEA, CHRONIC 08/25/2009  . HEMANGIOMA, HEPATIC 08/24/2009  . POLYCYSTIC OVARIAN DISEASE 08/24/2009  . Personal history of other diseases of digestive system 08/24/2009   Ihor Austin, [place]; [place]  Aldona Lento 10/30/2018, 4:31 PM  [place] [address] [place], [place], [address] Phone: [phone]   Fax:  [phone]  Name: Martha Espinoza MRN: [id number] Date of Birth: December 10, 1965

## 2018-10-31 ENCOUNTER — Other Ambulatory Visit (HOSPITAL_COMMUNITY)
Admission: RE | Admit: 2018-10-31 | Discharge: 2018-10-31 | Disposition: A | Discharge status: Home / Self Care | Payer: Commercial Managed Care - PPO | Source: Ambulatory Visit | Attending: Pulmonary Disease | Admitting: Pulmonary Disease

## 2018-10-31 ENCOUNTER — Other Ambulatory Visit: Payer: Self-pay | Admitting: Family Medicine

## 2018-11-04 ENCOUNTER — Ambulatory Visit: Payer: Commercial Managed Care - PPO | Admitting: Pulmonary Disease

## 2018-11-05 ENCOUNTER — Encounter: Payer: Self-pay | Admitting: Family Medicine

## 2018-11-05 ENCOUNTER — Ambulatory Visit (HOSPITAL_COMMUNITY): Payer: Commercial Managed Care - PPO | Admitting: Physical Therapy

## 2018-11-05 ENCOUNTER — Telehealth (HOSPITAL_COMMUNITY): Payer: Self-pay | Admitting: Physical Therapy

## 2018-11-05 ENCOUNTER — Other Ambulatory Visit: Payer: Self-pay

## 2018-11-05 ENCOUNTER — Other Ambulatory Visit: Payer: Self-pay | Admitting: Family Medicine

## 2018-11-05 ENCOUNTER — Ambulatory Visit (INDEPENDENT_AMBULATORY_CARE_PROVIDER_SITE_OTHER): Payer: Commercial Managed Care - PPO | Admitting: Family Medicine

## 2018-11-05 VITALS — BP 128/72 | HR 90 | Ht 61.0 in

## 2018-11-05 DIAGNOSIS — Z91148 Patient's other noncompliance with medication regimen for other reason: Secondary | ICD-10-CM

## 2018-11-05 DIAGNOSIS — M899 Disorder of bone, unspecified: Secondary | ICD-10-CM

## 2018-11-05 DIAGNOSIS — F17200 Nicotine dependence, unspecified, uncomplicated: Secondary | ICD-10-CM | POA: Diagnosis not present

## 2018-11-05 DIAGNOSIS — J449 Chronic obstructive pulmonary disease, unspecified: Secondary | ICD-10-CM | POA: Diagnosis not present

## 2018-11-05 DIAGNOSIS — E7800 Pure hypercholesterolemia, unspecified: Secondary | ICD-10-CM | POA: Diagnosis not present

## 2018-11-05 DIAGNOSIS — Z9114 Patient's other noncompliance with medication regimen: Secondary | ICD-10-CM | POA: Diagnosis not present

## 2018-11-05 MED ORDER — PROMETHAZINE HCL 25 MG PO TABS: ORAL_TABLET | ORAL | 3 refills | Status: DC

## 2018-11-05 NOTE — Progress Notes (Signed)
Virtual Visit via Video Note  I connected with pt on 11/05/18 at  1:30 PM EDT by a video enabled telemedicine application and verified that I am speaking with the correct person using two identifiers.  Location patient: home Location provider:work or home office Persons participating in the virtual visit: patient, provider  I discussed the limitations of evaluation and management by telemedicine and the availability of in person appointments. The patient expressed understanding and agreed to proceed.  Telemedicine visit is a necessity given the COVID-19 restrictions in place at the current time.  HPI: [name] being seen today for f/u COPD and T spine sclerotic osseous lesions. A/P for COPD as of last visit on 09/24/18: COPD: we'll continue daily prednisone.  She has not been able to tolerate any controller inhalers (most recently Trelegy ellipta rx'd by Dr. Vaughan Browner).  Awaiting repeat PFTs scheduling through Pulm.  Awaiting [place] [facility] referral to be set up. I'll leave her on daily low dose prednisone at this time. Will get labs that Dr. Vaughan Browner ordered for her (she never attempted to get them drawn anywhere)--CBC w/diff, IgE and forward those to him.  Will also check CMET, FLP, HbA1c.  All the above labs were normal except cholesterol was elevated: I started her on pravastatin at that time.  She is tolerating this med well.  Has f/u lab scheduled.  She has not followed up yet with pulm b/c she got sick with n/v illness.  She needs covid test before she can get in to pulm again. Still has days when breathing/wheezing is worse.  She feels like prednisone is helping, though. The trial of trelegy ellipta resulted in bad HA's so she stopped it. Still using albut multiple times per on a usual day.  She saw oncologist 10/08/18 for her T spine sclerotic osseous lesions.  Multiple myeloma w/u neg.  CEA normal.  Mammogram 10/23/18 normal. Per onc MD, DDX includes benign bone islands. An appt was made for  her to get surveillance colonoscopy with Dr. [name]. Onc plans to repeat BMET in 3 mo and repeat CT spine in 6-12 mo for stability.  She is in PT for her back lately.    Says the rash I saw her for 09/24/18 is involving her chest more.   No itching or pain.  ROS: See pertinent positives and negatives per HPI.  Past Medical History:  Diagnosis Date  . Abnormal CT of thoracic spine 09/12/2018   Noncontrast.  Increased in number and TINY increase in size of tiny, patchy sclerotic T spine lesions compared to 2018 CT.  Hem/onc referral recommended by reading radiologist to eval for marrow infiltrative process.  . Arthritis   . Asthma   . Bipolar 1 disorder ([place])   . CAD (coronary artery disease) 08/2018   1 V CAD noted on noncontrast CT chest 08/2018  . Cancer [facility])    Rectal Cancer 2000  . Chronic pain syndrome    chronic low back pain (Pain mgmt= Dr. [name]).  MRI showed no signif disc dz, only showed some facet arthropathy at L4-5, with joint diastasis at L4-5--plan as of 11/07/16 neurosurg eval is bilat facet injections.  . Colitis due to Clostridium difficile 2001  . COPD (chronic obstructive pulmonary disease) ([place])    PFTs 04/2016 showed no sign of copd or asthma, plus she actually had worse FEV1 after taking albuterol.  However, as of 06/2018 she has recent hx of improvement in sx's after taking albuterol.  LONG TIME SMOKER, won't  quit.  Intolerant to or cannot afford: pulmicort, QVAR, advair, symbicort, flovent, bevespi, and spiriva (as of 06/2018).  . Depression   . GAD (generalized anxiety disorder)   . GERD (gastroesophageal reflux disease)   . Hepatic hemangioma   . Hepatic steatosis 06/2016   Noted incidentally on CT chest  . History of Salmonella gastroenteritis   . Hyperlipidemia    a. Noted 02/2014.  Marland Kitchen Hypertension    HCTZ started 07/2015  . IBS (irritable bowel syndrome)   . Iron deficiency anemia 05/2014   per pt it is not due to vaginal blood loss; hemoccults sent to pt  in mail 413/16.  . Lumbar spondylosis    mild, mainly focused at L4-5 and L5-S1.  MRI L spine 09/09/17-->mild degenerative disc disease and facet arthrosis without any nerve root encroachment, no spinal stenosis.  . Microscopic hematuria    Intermittent (no w/u done yet, as of 03/2014)  . Migraine headache   . Morbid obesity ([place])   . Multiple lipomas 09/2016   left upper arm--very small ones.  . Neuromuscular disorder ([place])   . Nocturnal hypoxia 09/18/2017   Overnight oximetry -->qualified: ordered 2L oxygen during sleep.  . Obesity hypoventilation syndrome ([place])   . Pain in both lower legs 2018-2019   pain and numbness bilat LLs.  NCS/EMGs findings suggestive of S1 radiculopathy bilat: sx's + NCS findings suggestive of neurogenic claudication/spinal stenosis.  ? contribution of venous reflux?  . Peripheral edema Fall 2018   R>L, non-pitting for the most part--venous doppler neg for DVT 01/2017.  Saw Dr. Bridgett Larsson (Vasc) 04/12/17 and u/s showed some venous reflux dz but he felt her sx's in legs were NOT due to her venous insufficiency OR PAD.  Thigh high comp stockings rx'd.  . Polycystic ovarian syndrome   . Pruritic dermatitis 2015/2016   +Scabies prep at [facility] 07/2014. (Primarily pruritic skin, but eventually a subtle rash as well)  . Raynaud's phenomenon 2019  . Rectal cancer ([place])    a. Followed by Dr. Gala Romney, dx 2000-2001. Tumor removed from rectal.  . Recurrent sinusitis    +allergic rhinitis  . Right shoulder pain 2017   Murphy/Wainer: RC bursitis + RC tear (MRI)--injection trial is plan per pt report 05/04/16.  . Tobacco dependence   . Vitamin D deficiency     Past Surgical History:  Procedure Laterality Date  . ABI's complete Bilateral 09/30/2017   Normal ABIs.  Great toe pressures adequate for wound healing.  Femoral artery waveforms triphasic (normal).  . CARDIOVASCULAR STRESS TEST  03/24/14   Lexscan MIBI: mild anterior ischemia?  Cardiac CT angiogram  recommended/done.  . CHOLECYSTECTOMY    . COLONOSCOPY  05/05/2007   adenoma  . coronary CT angio  03/29/14   Two vessel dz/moderate stenosis of mid LAD and proximal RCA; cath recommended.  Marland Kitchen DILATION AND CURETTAGE OF UTERUS     for vaginal bleeding  . JOINT REPLACEMENT    . LEFT HEART CATHETERIZATION WITH CORONARY ANGIOGRAM N/A 03/31/2014   Normal coronaries, EF 50-93%, diastolic dysfunction.  Procedure: LEFT HEART CATHETERIZATION WITH CORONARY ANGIOGRAM;  Surgeon: Sinclair Grooms, MD;  Location[facility] C[facility] LAB;  Service: Cardiovascular;  Laterality: N/A;  . LOWER EXT VENOUS DOPPLERS Bilateral 02/08/2017   NEG for DVT (bilat)  . NCS/EMG  10/03/2017   S1 radiculopathy bilat  . Overnight oximetry testing  09/2017   qualified for oxygen supplementation during sleep  . PFTs  04/2016   No sign of COPD  or asthma; FEV 1 worse after albut.  . rectal mss  age 76   High-grade rectal adenoma removed from rectum   . TRANSTHORACIC ECHOCARDIOGRAM  03/23/14; 10/21/17   2015 Normal.  2019: mild LVH, EF 65-70%, study technically inadequate to assess diastolic function, valves were fine, wall motion normal.    Family History  Problem Relation Age of Onset  . Colon cancer Father 75  . Cancer Father   . Heart disease Mother   . Hypertension Mother   . Hyperlipidemia Mother   . Mental illness Mother   . Diabetes Mother   . Heart attack Mother   . Thyroid disease Mother   . Colon cancer Maternal Grandfather   . Stroke Neg Hx      Current Outpatient Medications:  .  albuterol (PROVENTIL) (2.5 MG/3ML) 0.083% nebulizer solution, Take 3 mLs (2.5 mg total) by nebulization every 6 (six) hours as needed for wheezing or shortness of breath., Disp: 75 mL, Rfl: 6 .  albuterol (VENTOLIN HFA) 108 (90 Base) MCG/ACT inhaler, INHALE 1 TO 2 PUFFS BY MOUTH EVERY 4 HOURS AS NEEDED, Disp: 16 g, Rfl: 0 .  AMITIZA 24 MCG capsule, Take 24 mcg by mouth 2 (two) times daily., Disp: , Rfl: 1 .  aspirin 81 MG chewable tablet,  Chew 1 tablet (81 mg total) by mouth daily., Disp: 14 tablet, Rfl: 0 .  azelastine (ASTELIN) 0.1 % nasal spray, Place 2 sprays into both nostrils 2 (two) times daily., Disp: 30 mL, Rfl: 6 .  busPIRone (BUSPAR) 15 MG tablet, 1 in the morning and 2 at bedtime, Disp: , Rfl:  .  cetirizine (ZYRTEC) 10 MG tablet, TAKE 1 TABLET BY MOUTH ONCE DAILY, Disp: 30 tablet, Rfl: 11 .  clonazePAM (KLONOPIN) 0.5 MG tablet, TAKE 1 TABLET BY MOUTH DURING THE DAY AS NEEDED AND 2 TABLETS AT BEDTIME, Disp: 90 tablet, Rfl: 5 .  cyclobenzaprine (FLEXERIL) 10 MG tablet, TAKE ONE TABLET BY MOUTH EVERY 6 HOURS AS NEEDED (SPASM RELATED TO HEADACHES) (Patient taking differently: Take 10 mg by mouth 3 (three) times daily. TAKE ONE TABLET BY MOUTH EVERY 6 HOURS AS NEEDED (SPASM RELATED TO HEADACHES)), Disp: 42 tablet, Rfl: 5 .  diphenhydrAMINE (BENADRYL) 25 MG tablet, Take 1 tablet (25 mg total) by mouth every 6 (six) hours. (Patient taking differently: Take 25-50 mg by mouth every 6 (six) hours as needed for itching or allergies. ), Disp: 20 tablet, Rfl: 0 .  fluticasone (FLONASE) 50 MCG/ACT nasal spray, Place 2 sprays into both nostrils daily., Disp: 16 g, Rfl: 1 .  hydrOXYzine (ATARAX/VISTARIL) 25 MG tablet, Take 25 mg by mouth 3 (three) times daily. , Disp: , Rfl:  .  IRON-FOLIC ACID PO, Take 65 mg by mouth daily. , Disp: , Rfl:  .  lamoTRIgine (LAMICTAL) 100 MG tablet, Take 200 mg by mouth at bedtime., Disp: , Rfl:  .  losartan (COZAAR) 50 MG tablet, Take 1 tablet by mouth once daily, Disp: 90 tablet, Rfl: 0 .  LYRICA 150 MG capsule, Take 1 tablet by mouth 3 (three) times daily., Disp: , Rfl: 2 .  Magnesium-Potassium 40-40 MG CAPS, Take 1 capsule by mouth daily., Disp: , Rfl:  .  meloxicam (MOBIC) 15 MG tablet, Take 15 mg by mouth daily., Disp: , Rfl: 1 .  metoprolol tartrate (LOPRESSOR) 50 MG tablet, Take 1 tablet by mouth twice daily, Disp: 180 tablet, Rfl: 0 .  Naldemedine Tosylate (SYMPROIC) 0.2 MG TABS, Take 1 tablet by  mouth  daily., Disp: , Rfl:  .  nitroGLYCERIN (NITROSTAT) 0.4 MG SL tablet, Place 1 tablet (0.4 mg total) under the tongue every 5 (five) minutes as needed for chest pain., Disp: 20 tablet, Rfl: 0 .  oxyCODONE-acetaminophen (PERCOCET) 10-325 MG tablet, Take 1 tablet by mouth 3 (three) times daily. , Disp: , Rfl:  .  pantoprazole (PROTONIX) 40 MG tablet, Take 1 tablet (40 mg total) by mouth 2 (two) times daily., Disp: 180 tablet, Rfl: 1 .  pravastatin (PRAVACHOL) 40 MG tablet, Take 1 tablet (40 mg total) by mouth daily., Disp: 30 tablet, Rfl: 3 .  predniSONE (DELTASONE) 5 MG tablet, Take 1 tablet by mouth once daily with breakfast, Disp: 30 tablet, Rfl: 3 .  pregabalin (LYRICA) 200 MG capsule, Take 200 mg by mouth at bedtime., Disp: , Rfl:  .  promethazine (PHENERGAN) 25 MG tablet, TAKE 1 TABLET BY MOUTH EVERY 6 HOURS AS NEEDED FOR NAUSEA AND VOMITING, Disp: 30 tablet, Rfl: 3 .  rOPINIRole (REQUIP) 0.25 MG tablet, TAKE 3 TABLETS BY MOUTH AT BEDTIME, Disp: 270 tablet, Rfl: 0  EXAM:  VITALS per patient if applicable: BP 270/35   Pulse 90   Ht '5\' 1"'$  (1.549 m)   LMP 10/27/2018 (Exact Date)   SpO2 98%   BMI 50.41 kg/m    GENERAL: alert, oriented, appears well and in no acute distress  HEENT: atraumatic, conjunttiva clear, no obvious abnormalities on inspection of external nose and ears  NECK: normal movements of the head and neck  LUNGS: on inspection no signs of respiratory distress, breathing rate appears normal, no obvious gross SOB, gasping or wheezing  CV: no obvious cyanosis  MS: moves all visible extremities without noticeable abnormality  PSYCH/NEURO: pleasant and cooperative, no obvious depression or anxiety, speech and thought processing grossly intact  LABS: none today  Lab Results  Component Value Date   TSH 1.12 10/08/2016   Lab Results  Component Value Date   WBC 9.8 09/25/2018   HGB 14.3 09/25/2018   HCT 45.5 09/25/2018   MCV 91.5 09/25/2018   PLT 265 09/25/2018    Lab Results  Component Value Date   CREATININE 0.65 09/25/2018   BUN 11 09/25/2018   NA 139 09/25/2018   K 3.8 09/25/2018   CL 103 09/25/2018   CO2 25 09/25/2018   Lab Results  Component Value Date   ALT 15 09/25/2018   AST 11 (L) 09/25/2018   ALKPHOS 81 09/25/2018   BILITOT 0.6 09/25/2018   Lab Results  Component Value Date   CHOL 260 (H) 09/24/2018   Lab Results  Component Value Date   HDL 35.40 (L) 09/24/2018   Lab Results  Component Value Date   LDLCALC 169 (H) 03/24/2014   Lab Results  Component Value Date   TRIG 245.0 (H) 09/24/2018   Lab Results  Component Value Date   CHOLHDL 7 09/24/2018   Lab Results  Component Value Date   HGBA1C 5.8 09/24/2018   ASSESSMENT AND PLAN:  Discussed the following assessment and plan:  1) COPD: keep f/u with Dr. Wonda Amis regarding repeat PFTs. Continue albuterol q4h prn.  Continue prednisone '5mg'$  qd-->she seems to feel improved on this. She is intolerant of all controller inhalers.  I am out of ideas/options regarding any further controller inhaler med. May see if daliresp provides any benefit in the future but will wait on pulm w/u and f/u visit before doing this. Will check into the status of the ENT referral (chronic hoarsness, ? Upper  airway wheezing) that Dr. Wonda Amis ordered 6+ wks ago. She continues to smoke; encouraged pt to quit but she won't b/c it would be too anxiety-provoking for her.  2) T spine sclerotic bony lesions: onc w/u unrevealing/reassuring.  She will be getting f/u BMET in 3 mo and repeat CT spine in 6 mo. Will be getting surveillance colonoscopy with Dr. [name] in near future.  3) Hypercholesterolemia: tolerating statin.  Lab visit is scheduled to recheck FLP in future.  I discussed the assessment and treatment plan with the patient. The patient was provided an opportunity to ask questions and all were answered. The patient agreed with the plan and demonstrated an understanding of the  instructions.   The patient was advised to call back or seek an in-person evaluation if the symptoms worsen or if the condition fails to improve as anticipated.  F/u: 3 mo  Signed:  Crissie Sickles, MD           11/05/2018

## 2018-11-05 NOTE — Telephone Encounter (Signed)
Pt called to cx today she is in too much pain to come-r/s for 11/06/18.

## 2018-11-06 ENCOUNTER — Ambulatory Visit (HOSPITAL_COMMUNITY): Payer: Commercial Managed Care - PPO

## 2018-11-06 ENCOUNTER — Encounter (HOSPITAL_COMMUNITY): Payer: Self-pay

## 2018-11-06 ENCOUNTER — Ambulatory Visit: Payer: Commercial Managed Care - PPO | Admitting: Gastroenterology

## 2018-11-06 DIAGNOSIS — M5441 Lumbago with sciatica, right side: Secondary | ICD-10-CM | POA: Diagnosis not present

## 2018-11-06 DIAGNOSIS — M6281 Muscle weakness (generalized): Secondary | ICD-10-CM | POA: Diagnosis not present

## 2018-11-06 DIAGNOSIS — R29898 Other symptoms and signs involving the musculoskeletal system: Secondary | ICD-10-CM | POA: Diagnosis not present

## 2018-11-06 DIAGNOSIS — R262 Difficulty in walking, not elsewhere classified: Secondary | ICD-10-CM | POA: Diagnosis not present

## 2018-11-06 DIAGNOSIS — G8929 Other chronic pain: Secondary | ICD-10-CM

## 2018-11-06 DIAGNOSIS — M5442 Lumbago with sciatica, left side: Secondary | ICD-10-CM | POA: Diagnosis not present

## 2018-11-06 NOTE — Therapy (Signed)
[place], [place], [address] Phone: [phone]   Fax:  [phone]  Physical Therapy Treatment  Patient Details  Name: Martha Espinoza MRN: [id number] Date of Birth: 1965/07/31 Referring Provider (PT): Barton Fanny    Encounter Date: 11/06/2018  PT End of Session - 11/06/18 1532    Visit Number  3    Number of Visits  7    Date for PT Re-Evaluation  11/17/18    Authorization Type  [employer] PPO    Authorization Time Period  10/27/18 to 11/17/18    Authorization - Visit Number  3    Authorization - Number of Visits  10    PT Start Time  1962    PT Stop Time  2297    PT Time Calculation (min)  39 min    Activity Tolerance  Patient limited by pain;Patient tolerated treatment well    Behavior During Therapy  [facility] for tasks assessed/performed;Anxious       Past Medical History:  Diagnosis Date  . Abnormal CT of thoracic spine 09/12/2018   Noncontrast.  Increased in number and TINY increase in size of tiny, patchy sclerotic T spine lesions compared to 2018 CT.  Hem/onc referral recommended by reading radiologist to eval for marrow infiltrative process.  . Arthritis   . Asthma   . Bipolar 1 disorder ([place])   . CAD (coronary artery disease) 08/2018   1 V CAD noted on noncontrast CT chest 08/2018  . Cancer [facility])    Rectal Cancer 2000  . Chronic pain syndrome    chronic low back pain (Pain mgmt= Dr. [name]).  MRI showed no signif disc dz, only showed some facet arthropathy at L4-5, with joint diastasis at L4-5--plan as of 11/07/16 neurosurg eval is bilat facet injections.  . Colitis due to Clostridium difficile 2001  . COPD (chronic obstructive pulmonary disease) ([place])    PFTs 04/2016 showed no sign of copd or asthma, plus she actually had worse FEV1 after taking albuterol.  However, as of 06/2018 she has recent hx of improvement in sx's after taking albuterol.  LONG TIME SMOKER, won't quit.  Intolerant to or cannot  afford: pulmicort, QVAR, advair, symbicort, flovent, bevespi, and spiriva (as of 06/2018).  . Depression   . GAD (generalized anxiety disorder)   . GERD (gastroesophageal reflux disease)   . Hepatic hemangioma   . Hepatic steatosis 06/2016   Noted incidentally on CT chest  . History of Salmonella gastroenteritis   . Hyperlipidemia    a. Noted 02/2014.  Marland Kitchen Hypertension    HCTZ started 07/2015  . IBS (irritable bowel syndrome)   . Iron deficiency anemia 05/2014   per pt it is not due to vaginal blood loss; hemoccults sent to pt in mail 413/16.  . Lumbar spondylosis    mild, mainly focused at L4-5 and L5-S1.  MRI L spine 09/09/17-->mild degenerative disc disease and facet arthrosis without any nerve root encroachment, no spinal stenosis.  . Microscopic hematuria    Intermittent (no w/u done yet, as of 03/2014)  . Migraine headache   . Morbid obesity ([place])   . Multiple lipomas 09/2016   left upper arm--very small ones.  . Neuromuscular disorder ([place])   . Nocturnal hypoxia 09/18/2017   Overnight oximetry -->qualified: ordered 2L oxygen during sleep.  . Obesity hypoventilation syndrome ([place])   . Pain in both lower legs 2018-2019   pain and numbness bilat LLs.  NCS/EMGs findings suggestive of  S1 radiculopathy bilat: sx's + NCS findings suggestive of neurogenic claudication/spinal stenosis.  ? contribution of venous reflux?  . Peripheral edema Fall 2018   R>L, non-pitting for the most part--venous doppler neg for DVT 01/2017.  Saw Dr. Bridgett Larsson (Vasc) 04/12/17 and u/s showed some venous reflux dz but he felt her sx's in legs were NOT due to her venous insufficiency OR PAD.  Thigh high comp stockings rx'd.  . Polycystic ovarian syndrome   . Pruritic dermatitis 2015/2016   +Scabies prep at [facility] 07/2014. (Primarily pruritic skin, but eventually a subtle rash as well)  . Raynaud's phenomenon 2019  . Rectal cancer ([place])    a. Followed by Dr. Gala Romney, dx 2000-2001. Tumor removed from rectal.   . Recurrent sinusitis    +allergic rhinitis  . Right shoulder pain 2017   Murphy/Wainer: RC bursitis + RC tear (MRI)--injection trial is plan per pt report 05/04/16.  . Tobacco dependence   . Vitamin D deficiency     Past Surgical History:  Procedure Laterality Date  . ABI's complete Bilateral 09/30/2017   Normal ABIs.  Great toe pressures adequate for wound healing.  Femoral artery waveforms triphasic (normal).  . CARDIOVASCULAR STRESS TEST  03/24/14   Lexscan MIBI: mild anterior ischemia?  Cardiac CT angiogram recommended/done.  . CHOLECYSTECTOMY    . COLONOSCOPY  05/05/2007   adenoma  . coronary CT angio  03/29/14   Two vessel dz/moderate stenosis of mid LAD and proximal RCA; cath recommended.  Marland Kitchen DILATION AND CURETTAGE OF UTERUS     for vaginal bleeding  . JOINT REPLACEMENT    . LEFT HEART CATHETERIZATION WITH CORONARY ANGIOGRAM N/A 03/31/2014   Normal coronaries, EF 33-54%, diastolic dysfunction.  Procedure: LEFT HEART CATHETERIZATION WITH CORONARY ANGIOGRAM;  Surgeon: Sinclair Grooms, MD;  Location: [facility] CATH LAB;  Service: Cardiovascular;  Laterality: N/A;  . LOWER EXT VENOUS DOPPLERS Bilateral 02/08/2017   NEG for DVT (bilat)  . NCS/EMG  10/03/2017   S1 radiculopathy bilat  . Overnight oximetry testing  09/2017   qualified for oxygen supplementation during sleep  . PFTs  04/2016   No sign of COPD or asthma; FEV 1 worse after albut.  . rectal mss  age 53   High-grade rectal adenoma removed from rectum   . TRANSTHORACIC ECHOCARDIOGRAM  03/23/14; 10/21/17   2015 Normal.  2019: mild LVH, EF 65-70%, study technically inadequate to assess diastolic function, valves were fine, wall motion normal.    There were no vitals filed for this visit.  Subjective Assessment - 11/06/18 1524    Subjective  Patient reports she had severe pain yesterday and that is why she cancelled her appointment and rescheduled. She reports she read the sleep hygeine sheet but has not implemented any of teh  tips yet. She states she was already trying to reduce her caffeine intake later in the day and is drinking juice or gatorade instead.    How long can you sit comfortably?  not at all    How long can you walk comfortably?  not at all, can walk short distances but long distances like [employer] I'd need a WC    Patient Stated Goals  reduce pain, be able to do more with grand-daughter    Currently in Pain?  Yes    Pain Score  8     Pain Location  Back    Pain Orientation  Lower    Pain Descriptors / Indicators  Burning;Throbbing    Pain  Type  Chronic pain    Pain Onset  More than a month ago    Pain Frequency  Constant        [facility] Adult PT Treatment/Exercise - 11/06/18 0001      Exercises   Exercises  Lumbar      Lumbar Exercises: Seated   Long Arc Quad on Chair  Strengthening;Both;1 set;10 reps    Other Seated Lumbar Exercises  Glut set: 10 reps, with 3 sec count, (pt counting out loud to deter val salva)    Other Seated Lumbar Exercises  W-back for postural strengthening: 10 reps, 3 sec holds      Lumbar Exercises: Supine   Ab Set  5 reps;Limitations;3 seconds    AB Set Limitations  2 sets, cues to count out loud for TrA contraction    Bent Knee Raise  5 reps;Limitations    Bent Knee Raise Limitations  2 sets, TrA contractoin      Lumbar Exercises: Sidelying   Clam  Both;10 reps;2 seconds       PT Education - 11/06/18 1556    Education Details  Educated on sleep hygeine interventions and to pick 1-2 items to attempt to implement this week. Discussed drinking water more regularly and tracking her water intake. Educated on exercises throghout session to strengthen bil LE's.    Person(s) Educated  Patient    Methods  Explanation;Demonstration    Comprehension  Verbalized understanding;Returned demonstration       PT Short Term Goals - 10/27/18 1420      PT SHORT TERM GOAL #1   Title  Patient to be able to verbalize at least 3-5 concepts of sleep hygiene and to report she has  been able to sleep for at least 3 hours without waking due to pain    Time  3    Period  Weeks    Status  New    Target Date  11/17/18      PT SHORT TERM GOAL #2   Title  Patient to be able to verbalize basic concepts of pain science education in order to assist in self-management and understanding of back pain    Time  3    Period  Weeks    Status  New      PT SHORT TERM GOAL #3   Title  Patient to report pain as being no more than 3/10 in order to improve QOL and functional task tolerance    Time  3    Period  Weeks    Status  New      PT SHORT TERM GOAL #4   Title  MMT to have improved by 1/2 grade in order to assist in improving functional mobility and reducing pain    Time  3    Period  Weeks    Status  New      PT SHORT TERM GOAL #5   Title  Patient to demonstrate correct functional mechanics for bed mobility and floor to stand lifting in order to assist in preventing exacerbation of back pain    Time  3    Period  Weeks    Status  New        PT Long Term Goals - 10/27/18 1424      PT LONG TERM GOAL #1   Title  No LTGs appropriate at this time- need to assess tolerance to and progress with PT, will update PRN  Plan - 11/06/18 1533    Clinical Impression Statement  Progressed patient with supine exercise for core strengthening and hip strengthening. She was able to tolerate increase in repetitions for exercises with no increase in pain. Patient required cues during exercises for breathing to prevent valsalva maneuver during transverse abdominus contractions. Time spent during exercises educating patient on normal pattern of DOMS and on homework to drink more water rather than just juice to improve hydration as this can help reduce inflammation systemically. She will continue to benefit from skilled PT interventions to address impairments and provide pani neuroscience education to improve QOL.    Personal Factors and Comorbidities  Age;Fitness;Behavior  Pattern;Past/Current Experience;Comorbidity 3+;Sex;Social Background;Education;Finances;Time since onset of injury/illness/exacerbation    Comorbidities  obesity, bipolar, duration of back pain    Examination-Activity Limitations  Transfers;Locomotion Level;Bed Mobility;Reach Overhead;Bend;Caring for Others;Sit;Sleep;Squat;Stairs;Stand;Lift    Examination-Participation Restrictions  Church;Medication Management;Yard Work;Cleaning;Meal Prep;Community Activity;Personal Finances;Driving;School;Interpersonal Relationship;Shop;Laundry;Volunteer    Stability/Clinical Decision Making  Evolving/Moderate complexity    Rehab Potential  Fair    PT Frequency  2x / week    PT Duration  3 weeks    PT Treatment/Interventions  ADLs/Self Care Home Management;Aquatic Therapy;Biofeedback;Cryotherapy;Electrical Stimulation;Iontophoresis 4mg /ml Dexamethasone;Moist Heat;Traction;Ultrasound;DME Instruction;Gait training;Stair training;Functional mobility training;Therapeutic activities;Therapeutic exercise;Balance training;Neuromuscular re-education;Patient/family education;Passive range of motion;Manual techniques;Dry needling;Energy conservation;Taping;Visual/perceptual remediation/compensation;Spinal Manipulations;Joint Manipulations    PT Next Visit Plan  Continue pain science based approach, give sleep hygiene handout. Gentle exercise and activity as tolerated. Consider exericse on heat or with TENS at first.    PT Home Exercise Plan  Eval: meditation routine, glute sets, TA sets, TA sets with march    Consulted and Agree with Plan of Care  Patient       Patient will benefit from skilled therapeutic intervention in order to improve the following deficits and impairments:  Abnormal gait, Decreased coordination, Decreased range of motion, Difficulty walking, Increased fascial restricitons, Decreased endurance, Decreased activity tolerance, Impaired perceived functional ability, Pain, Decreased balance, Impaired  flexibility, Hypomobility, Improper body mechanics, Decreased mobility, Decreased strength, Impaired sensation, Postural dysfunction  Visit Diagnosis: 1. Difficulty in walking, not elsewhere classified   2. Other symptoms and signs involving the musculoskeletal system   3. Muscle weakness (generalized)   4. Chronic bilateral low back pain with bilateral sciatica        Problem List Patient Active Problem List   Diagnosis Date Noted  . Bone lesion 09/25/2018  . Nocturnal hypoxia 10/04/2017  . Leg pain, bilateral 07/15/2017  . Chronic venous insufficiency 04/12/2017  . Acute respiratory failure with hypoxia ([place]) 10/18/2015  . COPD exacerbation ([place]) 10/18/2015  . Drug reaction 07/23/2015  . Fever of unknown origin 07/22/2015  . Lactic acidosis 07/22/2015  . Hypokalemia 07/22/2015  . Nausea and vomiting 07/22/2015  . Chronic back pain 07/22/2015  . Abdominal pain 07/26/2014  . Nausea without vomiting 07/26/2014  . History of colon cancer 07/26/2014  . Iron deficiency anemia 07/07/2014  . Vitamin D deficiency 07/07/2014  . Abnormal nuclear stress test 03/31/2014  . Abnormal cardiac CT angiography 03/31/2014  . Hyperlipidemia 03/24/2014  . Morbid obesity ([place])   . Chest pain of uncertain etiology 97/04/6376  . COPD (chronic obstructive pulmonary disease) ([place]) 03/23/2014  . Leukocytosis 03/23/2014  . Hyperglycemia 03/23/2014  . Bipolar 1 disorder ([place]) 03/23/2014  . Pruritic rash 03/23/2014  . Chronic anxiety 03/23/2014  . Tobacco abuse 03/23/2014  . Intractable migraine without aura and with status migrainosus 01/14/2014  . Sinusitis 07/02/2013  . Urinary frequency 07/02/2013  .  Migraine 07/02/2013  . Anxiety state, unspecified 04/14/2013  . Obesity 04/14/2013  . Anemia 04/14/2013  . CONSTIPATION 10/12/2009  . CARCINOMA IN SITU OF RECTUM 08/25/2009  . GERD 08/25/2009  . IRRITABLE BOWEL SYNDROME 08/25/2009  . DIARRHEA, CHRONIC 08/25/2009  . HEMANGIOMA, HEPATIC  08/24/2009  . POLYCYSTIC OVARIAN DISEASE 08/24/2009  . Personal history of other diseases of digestive system 08/24/2009    Kipp Brood, PT, DPT, [facility] Physical Therapist with [facility]  11/06/2018 5:45 PM    [place] [address] [place], [place], [address] Phone: [phone]   Fax:  [phone]  Name: ASHRITA CHRISMER MRN: [id number] Date of Birth: 10-Nov-1965

## 2018-11-12 ENCOUNTER — Other Ambulatory Visit: Payer: Self-pay

## 2018-11-12 ENCOUNTER — Ambulatory Visit (HOSPITAL_COMMUNITY): Payer: Commercial Managed Care - PPO | Admitting: Physical Therapy

## 2018-11-12 ENCOUNTER — Other Ambulatory Visit: Payer: Self-pay | Admitting: Family Medicine

## 2018-11-12 ENCOUNTER — Encounter (HOSPITAL_COMMUNITY): Payer: Self-pay | Admitting: Physical Therapy

## 2018-11-12 DIAGNOSIS — M5442 Lumbago with sciatica, left side: Secondary | ICD-10-CM | POA: Diagnosis not present

## 2018-11-12 DIAGNOSIS — R29898 Other symptoms and signs involving the musculoskeletal system: Secondary | ICD-10-CM | POA: Diagnosis not present

## 2018-11-12 DIAGNOSIS — R262 Difficulty in walking, not elsewhere classified: Secondary | ICD-10-CM

## 2018-11-12 DIAGNOSIS — G8929 Other chronic pain: Secondary | ICD-10-CM

## 2018-11-12 DIAGNOSIS — M6281 Muscle weakness (generalized): Secondary | ICD-10-CM | POA: Diagnosis not present

## 2018-11-12 DIAGNOSIS — M5441 Lumbago with sciatica, right side: Secondary | ICD-10-CM | POA: Diagnosis not present

## 2018-11-12 NOTE — Therapy (Signed)
[place], [place], [address] Phone: [phone]   Fax:  [phone]  Physical Therapy Treatment  Patient Details  Name: Martha Espinoza MRN: [id number] Date of Birth: 1965/12/06 Referring Provider (PT): Barton Fanny    Encounter Date: 11/12/2018  PT End of Session - 11/12/18 1505    Visit Number  4    Number of Visits  7    Date for PT Re-Evaluation  11/17/18    Authorization Type  [employer] PPO    Authorization Time Period  10/27/18 to 11/17/18    Authorization - Visit Number  4    Authorization - Number of Visits  10    PT Start Time  3716    PT Stop Time  1459    PT Time Calculation (min)  38 min    Activity Tolerance  Patient limited by pain    Behavior During Therapy  Anxious       Past Medical History:  Diagnosis Date  . Abnormal CT of thoracic spine 09/12/2018   Noncontrast.  Increased in number and TINY increase in size of tiny, patchy sclerotic T spine lesions compared to 2018 CT.  Hem/onc referral recommended by reading radiologist to eval for marrow infiltrative process.  . Arthritis   . Asthma   . Bipolar 1 disorder ([place])   . CAD (coronary artery disease) 08/2018   1 V CAD noted on noncontrast CT chest 08/2018  . Cancer [facility])    Rectal Cancer 2000  . Chronic pain syndrome    chronic low back pain (Pain mgmt= Dr. [name]).  MRI showed no signif disc dz, only showed some facet arthropathy at L4-5, with joint diastasis at L4-5--plan as of 11/07/16 neurosurg eval is bilat facet injections.  . Colitis due to Clostridium difficile 2001  . COPD (chronic obstructive pulmonary disease) ([place])    PFTs 04/2016 showed no sign of copd or asthma, plus she actually had worse FEV1 after taking albuterol.  However, as of 06/2018 she has recent hx of improvement in sx's after taking albuterol.  LONG TIME SMOKER, won't quit.  Intolerant to or cannot afford: pulmicort, QVAR, advair, symbicort, flovent, bevespi, and  spiriva (as of 06/2018).  . Depression   . GAD (generalized anxiety disorder)   . GERD (gastroesophageal reflux disease)   . Hepatic hemangioma   . Hepatic steatosis 06/2016   Noted incidentally on CT chest  . History of Salmonella gastroenteritis   . Hyperlipidemia    a. Noted 02/2014.  Marland Kitchen Hypertension    HCTZ started 07/2015  . IBS (irritable bowel syndrome)   . Iron deficiency anemia 05/2014   per pt it is not due to vaginal blood loss; hemoccults sent to pt in mail 413/16.  . Lumbar spondylosis    mild, mainly focused at L4-5 and L5-S1.  MRI L spine 09/09/17-->mild degenerative disc disease and facet arthrosis without any nerve root encroachment, no spinal stenosis.  . Microscopic hematuria    Intermittent (no w/u done yet, as of 03/2014)  . Migraine headache   . Morbid obesity ([place])   . Multiple lipomas 09/2016   left upper arm--very small ones.  . Neuromuscular disorder ([place])   . Nocturnal hypoxia 09/18/2017   Overnight oximetry -->qualified: ordered 2L oxygen during sleep.  . Obesity hypoventilation syndrome ([place])   . Pain in both lower legs 2018-2019   pain and numbness bilat LLs.  NCS/EMGs findings suggestive of S1 radiculopathy bilat: sx's + NCS  findings suggestive of neurogenic claudication/spinal stenosis.  ? contribution of venous reflux?  . Peripheral edema Fall 2018   R>L, non-pitting for the most part--venous doppler neg for DVT 01/2017.  Saw Dr. Bridgett Larsson (Vasc) 04/12/17 and u/s showed some venous reflux dz but he felt her sx's in legs were NOT due to her venous insufficiency OR PAD.  Thigh high comp stockings rx'd.  . Polycystic ovarian syndrome   . Pruritic dermatitis 2015/2016   +Scabies prep at [facility] 07/2014. (Primarily pruritic skin, but eventually a subtle rash as well)  . Raynaud's phenomenon 2019  . Rectal cancer ([place])    a. Followed by Dr. Gala Romney, dx 2000-2001. Tumor removed from rectal.  . Recurrent sinusitis    +allergic rhinitis  . Right shoulder  pain 2017   Murphy/Wainer: RC bursitis + RC tear (MRI)--injection trial is plan per pt report 05/04/16.  . Tobacco dependence   . Vitamin D deficiency     Past Surgical History:  Procedure Laterality Date  . ABI's complete Bilateral 09/30/2017   Normal ABIs.  Great toe pressures adequate for wound healing.  Femoral artery waveforms triphasic (normal).  . CARDIOVASCULAR STRESS TEST  03/24/14   Lexscan MIBI: mild anterior ischemia?  Cardiac CT angiogram recommended/done.  . CHOLECYSTECTOMY    . COLONOSCOPY  05/05/2007   adenoma  . coronary CT angio  03/29/14   Two vessel dz/moderate stenosis of mid LAD and proximal RCA; cath recommended.  Marland Kitchen DILATION AND CURETTAGE OF UTERUS     for vaginal bleeding  . JOINT REPLACEMENT    . LEFT HEART CATHETERIZATION WITH CORONARY ANGIOGRAM N/A 03/31/2014   Normal coronaries, EF 14-43%, diastolic dysfunction.  Procedure: LEFT HEART CATHETERIZATION WITH CORONARY ANGIOGRAM;  Surgeon: Sinclair Grooms, MD;  Location: [facility] CATH LAB;  Service: Cardiovascular;  Laterality: N/A;  . LOWER EXT VENOUS DOPPLERS Bilateral 02/08/2017   NEG for DVT (bilat)  . NCS/EMG  10/03/2017   S1 radiculopathy bilat  . Overnight oximetry testing  09/2017   qualified for oxygen supplementation during sleep  . PFTs  04/2016   No sign of COPD or asthma; FEV 1 worse after albut.  . rectal mss  age 6   High-grade rectal adenoma removed from rectum   . TRANSTHORACIC ECHOCARDIOGRAM  03/23/14; 10/21/17   2015 Normal.  2019: mild LVH, EF 65-70%, study technically inadequate to assess diastolic function, valves were fine, wall motion normal.    There were no vitals filed for this visit.  Subjective Assessment - 11/12/18 1419    Subjective  I am not having a good day. Whatever we did last time seemed to flare them up, they were swollen all weekend but they are better today. I've had at least 5 doctors and no one has been able to tell me what's going on with them. I'm not sleeping due to pain.  I've switched to water at night, otherwise I'm swapping between water and gatorade. Any of the leg exercises I've not been able to do. when I have to do my legs up and down that is what gets my back and butt bone acting up. Don't ask me many questions right now because my head is in migraine and its hard for me to think, its hard for me to think right now.    How long can you sit comfortably?  not at all, 5-10 minutes    How long can you stand comfortably?  it depends on how many pain pills I've gotten  in    How long can you walk comfortably?  short distances are OK, [employer] I'd need a WC    Patient Stated Goals  reduce pain, be able to do more with grand-daughter    Currently in Pain?  Yes    Pain Score  9     Pain Location  Back    Pain Orientation  Lower    Pain Descriptors / Indicators  Tightness    Pain Type  Chronic pain                       [facility] Adult PT Treatment/Exercise - 11/12/18 0001      Lumbar Exercises: Standing   Other Standing Lumbar Exercises  lumbar 3D excurions 1x10 all directions       Lumbar Exercises: Seated   Other Seated Lumbar Exercises  lumbar rotation walk arounds 1x10j; seated clams with red TB 2x5     Other Seated Lumbar Exercises  glute set 1x10 5 second holds       Moist Heat Therapy   Number Minutes Moist Heat  5 Minutes    Moist Heat Location  Lumbar Spine             PT Education - 11/12/18 1504    Education Details  extensive education provided- see clinical impression section of note for details    Person(s) Educated  Patient    Methods  Explanation    Comprehension  Need further instruction       PT Short Term Goals - 10/27/18 1420      PT SHORT TERM GOAL #1   Title  Patient to be able to verbalize at least 3-5 concepts of sleep hygiene and to report she has been able to sleep for at least 3 hours without waking due to pain    Time  3    Period  Weeks    Status  New    Target Date  11/17/18      PT SHORT TERM  GOAL #2   Title  Patient to be able to verbalize basic concepts of pain science education in order to assist in self-management and understanding of back pain    Time  3    Period  Weeks    Status  New      PT SHORT TERM GOAL #3   Title  Patient to report pain as being no more than 3/10 in order to improve QOL and functional task tolerance    Time  3    Period  Weeks    Status  New      PT SHORT TERM GOAL #4   Title  MMT to have improved by 1/2 grade in order to assist in improving functional mobility and reducing pain    Time  3    Period  Weeks    Status  New      PT SHORT TERM GOAL #5   Title  Patient to demonstrate correct functional mechanics for bed mobility and floor to stand lifting in order to assist in preventing exacerbation of back pain    Time  3    Period  Weeks    Status  New        PT Long Term Goals - 10/27/18 1424      PT LONG TERM GOAL #1   Title  No LTGs appropriate at this time- need to assess tolerance to and progress with PT, will update PRN  Plan - 11/12/18 1505    Clinical Impression Statement  Martha Espinoza arrives reporting ongoing high levels of pain which she attributes to exercises performed last session. Had extensive and very frank discussion with her about expectations of therapy, also explained that at first exercises often can feel painful and sore due to muscles/joints not performing an activity for a long while; discussed that therapy can take a longer time to take effect than medications. Used old car sitting without oil/fluid changes or being driven, versus well maintained car example for benefits of exercise/activity. Explained that increased pain is to be expected at first, and ultimately left option of pushing through and continuing with therapy versus discharging- she opts to try to continue with PT. Placed her on moist heat prior to exercise to assist in pain relief (5 minutes, not included in billing); otherwise worked on  low level strengthening exercises as tolerated today. Plan for re-assess/recertification next session.    Personal Factors and Comorbidities  Age;Fitness;Behavior Pattern;Past/Current Experience;Comorbidity 3+;Sex;Social Background;Education;Finances;Time since onset of injury/illness/exacerbation    Comorbidities  obesity, bipolar, duration of back pain    Examination-Activity Limitations  Transfers;Locomotion Level;Bed Mobility;Reach Overhead;Bend;Caring for Others;Sit;Sleep;Squat;Stairs;Stand;Lift    Examination-Participation Restrictions  Church;Medication Management;Yard Work;Cleaning;Meal Prep;Community Activity;Personal Finances;Driving;School;Interpersonal Relationship;Shop;Laundry;Volunteer    Stability/Clinical Decision Making  Evolving/Moderate complexity    Clinical Decision Making  High    Rehab Potential  Fair    PT Frequency  2x / week    PT Duration  3 weeks    PT Treatment/Interventions  ADLs/Self Care Home Management;Aquatic Therapy;Biofeedback;Cryotherapy;Electrical Stimulation;Iontophoresis 4mg /ml Dexamethasone;Moist Heat;Traction;Ultrasound;DME Instruction;Gait training;Stair training;Functional mobility training;Therapeutic activities;Therapeutic exercise;Balance training;Neuromuscular re-education;Patient/family education;Passive range of motion;Manual techniques;Dry needling;Energy conservation;Taping;Visual/perceptual remediation/compensation;Spinal Manipulations;Joint Manipulations    PT Next Visit Plan  re-assess next session    PT Home Exercise Plan  Eval: meditation routine, glute sets, TA sets, TA sets with march    Consulted and Agree with Plan of Care  Patient       Patient will benefit from skilled therapeutic intervention in order to improve the following deficits and impairments:  Abnormal gait, Decreased coordination, Decreased range of motion, Difficulty walking, Increased fascial restricitons, Decreased endurance, Decreased activity tolerance, Impaired perceived  functional ability, Pain, Decreased balance, Impaired flexibility, Hypomobility, Improper body mechanics, Decreased mobility, Decreased strength, Impaired sensation, Postural dysfunction  Visit Diagnosis: 1. Difficulty in walking, not elsewhere classified   2. Other symptoms and signs involving the musculoskeletal system   3. Muscle weakness (generalized)   4. Chronic bilateral low back pain with bilateral sciatica        Problem List Patient Active Problem List   Diagnosis Date Noted  . Bone lesion 09/25/2018  . Nocturnal hypoxia 10/04/2017  . Leg pain, bilateral 07/15/2017  . Chronic venous insufficiency 04/12/2017  . Acute respiratory failure with hypoxia ([place]) 10/18/2015  . COPD exacerbation ([place]) 10/18/2015  . Drug reaction 07/23/2015  . Fever of unknown origin 07/22/2015  . Lactic acidosis 07/22/2015  . Hypokalemia 07/22/2015  . Nausea and vomiting 07/22/2015  . Chronic back pain 07/22/2015  . Abdominal pain 07/26/2014  . Nausea without vomiting 07/26/2014  . History of colon cancer 07/26/2014  . Iron deficiency anemia 07/07/2014  . Vitamin D deficiency 07/07/2014  . Abnormal nuclear stress test 03/31/2014  . Abnormal cardiac CT angiography 03/31/2014  . Hyperlipidemia 03/24/2014  . Morbid obesity ([place])   . Chest pain of uncertain etiology 38/75/6433  . COPD (chronic obstructive pulmonary disease) ([place]) 03/23/2014  . Leukocytosis 03/23/2014  . Hyperglycemia 03/23/2014  .  Bipolar 1 disorder ([place]) 03/23/2014  . Pruritic rash 03/23/2014  . Chronic anxiety 03/23/2014  . Tobacco abuse 03/23/2014  . Intractable migraine without aura and with status migrainosus 01/14/2014  . Sinusitis 07/02/2013  . Urinary frequency 07/02/2013  . Migraine 07/02/2013  . Anxiety state, unspecified 04/14/2013  . Obesity 04/14/2013  . Anemia 04/14/2013  . CONSTIPATION 10/12/2009  . CARCINOMA IN SITU OF RECTUM 08/25/2009  . GERD 08/25/2009  . IRRITABLE BOWEL SYNDROME 08/25/2009  .  DIARRHEA, CHRONIC 08/25/2009  . HEMANGIOMA, HEPATIC 08/24/2009  . POLYCYSTIC OVARIAN DISEASE 08/24/2009  . Personal history of other diseases of digestive system 08/24/2009    Martha Espinoza PT, DPT, CBIS  Supplemental Physical Therapist [facility]    Pager [phone] Acute Rehab Office [place] [address] [place], [place], [address] Phone: [phone]   Fax:  [phone]  Name: Martha Espinoza MRN: [id number] Date of Birth: Apr 26, 1965

## 2018-11-13 NOTE — Telephone Encounter (Signed)
RF request for Nitrostat 0.4mg  LOV: 11/05/18 Next ov: 12/02/18 Last written: 05/04/16 #20 no refills   I was unsure if I could fill this medication since it has been 2 years since it was last filled. Please advise. Thank you

## 2018-11-14 ENCOUNTER — Ambulatory Visit (HOSPITAL_COMMUNITY): Payer: Commercial Managed Care - PPO | Admitting: Physical Therapy

## 2018-11-14 ENCOUNTER — Encounter (HOSPITAL_COMMUNITY): Payer: Self-pay | Admitting: Physical Therapy

## 2018-11-14 ENCOUNTER — Other Ambulatory Visit: Payer: Self-pay

## 2018-11-14 DIAGNOSIS — R29898 Other symptoms and signs involving the musculoskeletal system: Secondary | ICD-10-CM | POA: Diagnosis not present

## 2018-11-14 DIAGNOSIS — M5442 Lumbago with sciatica, left side: Secondary | ICD-10-CM | POA: Diagnosis not present

## 2018-11-14 DIAGNOSIS — G8929 Other chronic pain: Secondary | ICD-10-CM | POA: Diagnosis not present

## 2018-11-14 DIAGNOSIS — M5441 Lumbago with sciatica, right side: Secondary | ICD-10-CM | POA: Diagnosis not present

## 2018-11-14 DIAGNOSIS — M6281 Muscle weakness (generalized): Secondary | ICD-10-CM | POA: Diagnosis not present

## 2018-11-14 DIAGNOSIS — R262 Difficulty in walking, not elsewhere classified: Secondary | ICD-10-CM

## 2018-11-14 NOTE — Therapy (Signed)
[place], [place], [address] Phone: [phone]   Fax:  [phone]  Physical Therapy Treatment  Patient Details  Name: Martha Espinoza MRN: [id number] Date of Birth: 1966/03/26 Referring Provider (PT): Barton Fanny    Encounter Date: 11/14/2018  PT End of Session - 11/14/18 1404    Visit Number  5    Number of Visits  11    Date for PT Re-Evaluation  12/12/18    Authorization Type  [employer] PPO    Authorization Time Period  10/27/18 to 11/17/18; NEW 11/14/18 to 12/12/18    Authorization - Visit Number  5    Authorization - Number of Visits  10    PT Start Time  8453    PT Stop Time  1357    PT Time Calculation (min)  40 min    Activity Tolerance  Patient limited by pain    Behavior During Therapy  Anxious       Past Medical History:  Diagnosis Date  . Abnormal CT of thoracic spine 09/12/2018   Noncontrast.  Increased in number and TINY increase in size of tiny, patchy sclerotic T spine lesions compared to 2018 CT.  Hem/onc referral recommended by reading radiologist to eval for marrow infiltrative process.  . Arthritis   . Asthma   . Bipolar 1 disorder ([place])   . CAD (coronary artery disease) 08/2018   1 V CAD noted on noncontrast CT chest 08/2018  . Cancer [facility])    Rectal Cancer 2000  . Chronic pain syndrome    chronic low back pain (Pain mgmt= Dr. [name]).  MRI showed no signif disc dz, only showed some facet arthropathy at L4-5, with joint diastasis at L4-5--plan as of 11/07/16 neurosurg eval is bilat facet injections.  . Colitis due to Clostridium difficile 2001  . COPD (chronic obstructive pulmonary disease) ([place])    PFTs 04/2016 showed no sign of copd or asthma, plus she actually had worse FEV1 after taking albuterol.  However, as of 06/2018 she has recent hx of improvement in sx's after taking albuterol.  LONG TIME SMOKER, won't quit.  Intolerant to or cannot afford: pulmicort, QVAR, advair, symbicort,  flovent, bevespi, and spiriva (as of 06/2018).  . Depression   . GAD (generalized anxiety disorder)   . GERD (gastroesophageal reflux disease)   . Hepatic hemangioma   . Hepatic steatosis 06/2016   Noted incidentally on CT chest  . History of Salmonella gastroenteritis   . Hyperlipidemia    a. Noted 02/2014.  Marland Kitchen Hypertension    HCTZ started 07/2015  . IBS (irritable bowel syndrome)   . Iron deficiency anemia 05/2014   per pt it is not due to vaginal blood loss; hemoccults sent to pt in mail 413/16.  . Lumbar spondylosis    mild, mainly focused at L4-5 and L5-S1.  MRI L spine 09/09/17-->mild degenerative disc disease and facet arthrosis without any nerve root encroachment, no spinal stenosis.  . Microscopic hematuria    Intermittent (no w/u done yet, as of 03/2014)  . Migraine headache   . Morbid obesity ([place])   . Multiple lipomas 09/2016   left upper arm--very small ones.  . Neuromuscular disorder ([place])   . Nocturnal hypoxia 09/18/2017   Overnight oximetry -->qualified: ordered 2L oxygen during sleep.  . Obesity hypoventilation syndrome ([place])   . Pain in both lower legs 2018-2019   pain and numbness bilat LLs.  NCS/EMGs findings suggestive of S1 radiculopathy  bilat: sx's + NCS findings suggestive of neurogenic claudication/spinal stenosis.  ? contribution of venous reflux?  . Peripheral edema Fall 2018   R>L, non-pitting for the most part--venous doppler neg for DVT 01/2017.  Saw Dr. Bridgett Larsson (Vasc) 04/12/17 and u/s showed some venous reflux dz but he felt her sx's in legs were NOT due to her venous insufficiency OR PAD.  Thigh high comp stockings rx'd.  . Polycystic ovarian syndrome   . Pruritic dermatitis 2015/2016   +Scabies prep at [facility] 07/2014. (Primarily pruritic skin, but eventually a subtle rash as well)  . Raynaud's phenomenon 2019  . Rectal cancer ([place])    a. Followed by Dr. Gala Romney, dx 2000-2001. Tumor removed from rectal.  . Recurrent sinusitis    +allergic  rhinitis  . Right shoulder pain 2017   Murphy/Wainer: RC bursitis + RC tear (MRI)--injection trial is plan per pt report 05/04/16.  . Tobacco dependence   . Vitamin D deficiency     Past Surgical History:  Procedure Laterality Date  . ABI's complete Bilateral 09/30/2017   Normal ABIs.  Great toe pressures adequate for wound healing.  Femoral artery waveforms triphasic (normal).  . CARDIOVASCULAR STRESS TEST  03/24/14   Lexscan MIBI: mild anterior ischemia?  Cardiac CT angiogram recommended/done.  . CHOLECYSTECTOMY    . COLONOSCOPY  05/05/2007   adenoma  . coronary CT angio  03/29/14   Two vessel dz/moderate stenosis of mid LAD and proximal RCA; cath recommended.  Marland Kitchen DILATION AND CURETTAGE OF UTERUS     for vaginal bleeding  . JOINT REPLACEMENT    . LEFT HEART CATHETERIZATION WITH CORONARY ANGIOGRAM N/A 03/31/2014   Normal coronaries, EF 69-62%, diastolic dysfunction.  Procedure: LEFT HEART CATHETERIZATION WITH CORONARY ANGIOGRAM;  Surgeon: Sinclair Grooms, MD;  Location: [facility] CATH LAB;  Service: Cardiovascular;  Laterality: N/A;  . LOWER EXT VENOUS DOPPLERS Bilateral 02/08/2017   NEG for DVT (bilat)  . NCS/EMG  10/03/2017   S1 radiculopathy bilat  . Overnight oximetry testing  09/2017   qualified for oxygen supplementation during sleep  . PFTs  04/2016   No sign of COPD or asthma; FEV 1 worse after albut.  . rectal mss  age 17   High-grade rectal adenoma removed from rectum   . TRANSTHORACIC ECHOCARDIOGRAM  03/23/14; 10/21/17   2015 Normal.  2019: mild LVH, EF 65-70%, study technically inadequate to assess diastolic function, valves were fine, wall motion normal.    There were no vitals filed for this visit.  Subjective Assessment - 11/14/18 1319    Subjective  My pain is not as bad today; after our session wednesday I had horrible pain that kept me up until 9 the next morning. Yesterday was off and on. I have headaches very badly today.I stayed in bed all day yesterday.    How long  can you sit comfortably?  not at all, 5-10 minutes    How long can you stand comfortably?  it depends on how many pain pills I've gotten in    How long can you walk comfortably?  short distances are OK, [employer] I'd need a WC    Patient Stated Goals  reduce pain, be able to do more with grand-daughter    Currently in Pain?  Yes    Pain Score  7     Pain Location  Back    Pain Orientation  Lower    Pain Descriptors / Indicators  Throbbing;Burning    Pain Type  Chronic pain    Pain Radiating Towards  its still running down the legs    Pain Onset  More than a month ago    Pain Frequency  Constant    Aggravating Factors   everything    Pain Relieving Factors  semi-recumbent position in bed, pain medicine    Effect of Pain on Daily Activities  severe         [facility] PT Assessment - 11/14/18 0001      Assessment   Medical Diagnosis  back pain     Referring Provider (PT)  Barton Fanny     Onset Date/Surgical Date  --   years ago    Next MD Visit  September 2020     Prior Therapy  years ago for her back at this clinic       Precautions   Precautions  None      Restrictions   Weight Bearing Restrictions  Yes      Balance Screen   Has the patient fallen in the past 6 months  No    Has the patient had a decrease in activity level because of a fear of falling?   Yes    Is the patient reluctant to leave their home because of a fear of falling?   Yes      [place] residence    Additional Comments  ramp       Prior Function   Level of Independence  Independent;Independent with basic ADLs      AROM   Lumbar Flexion  50% limited, uses hands to push up legs     Lumbar Extension  50% limited, improved isolation of spine; REIS peripheralizes pain      Lumbar - Right Side Bend  moderate limitations     Lumbar - Left Side Bend  moderate limitations       Strength   Right Hip Flexion  3-/5    Right Hip ABduction  3-/5    Left Hip Flexion  3-/5     Left Hip ABduction  3-/5    Right Knee Flexion  3-/5    Right Knee Extension  4/5    Left Knee Flexion  3-/5    Left Knee Extension  4/5    Right Ankle Dorsiflexion  3/5    Left Ankle Dorsiflexion  3/5                   [facility] Adult PT Treatment/Exercise - 11/14/18 0001      Lumbar Exercises: Seated   Other Seated Lumbar Exercises  seated clams 1x10 red TB; seated TA sets with overhead UE raise 1x5 with TA set      Other Seated Lumbar Exercises  W-back for postural strengthening: 3 reps, 3 sec holds; backwards shoulder rolls 1x10              PT Education - 11/14/18 1404    Education Details  Extensive education provided regarding how hurt does not equal harm, benefits of mobility and strengthening, and potential benefits of PT moving forward especially as exercises being performed are not physically capable of causing harm.    Person(s) Educated  Patient    Methods  Explanation    Comprehension  Verbalized understanding       PT Short Term Goals - 11/14/18 1332      PT SHORT TERM GOAL #1   Title  Patient to be able to  verbalize at least 3-5 concepts of sleep hygiene and to report she has been able to sleep for at least 3 hours without waking due to pain    Baseline  8/21- i don't ever sleep well due to pain    Time  3    Period  Weeks    Status  On-going      PT SHORT TERM GOAL #2   Title  Patient to be able to verbalize basic concepts of pain science education in order to assist in self-management and understanding of back pain    Baseline  8/21- ongoing    Time  3    Period  Weeks    Status  On-going      PT SHORT TERM GOAL #3   Title  Patient to report pain as being no more than 3/10 in order to improve QOL and functional task tolerance    Baseline  8/21- 6-7/10 at best recently    Time  3    Period  Weeks    Status  On-going      PT SHORT TERM GOAL #4   Title  MMT to have improved by 1/2 grade in order to assist in improving functional  mobility and reducing pain    Baseline  8/21- flowsheets    Time  3    Period  Weeks    Status  Partially Met      PT SHORT TERM GOAL #5   Title  Patient to demonstrate correct functional mechanics for bed mobility and floor to stand lifting in order to assist in preventing exacerbation of back pain    Baseline  8/21- has demonstrated bed mobility mechanics, have not assessed lifting due to pain    Time  3    Period  Weeks    Status  On-going        PT Long Term Goals - 11/14/18 1335      PT LONG TERM GOAL #1   Title  No LTGs appropriate at this time- need to assess tolerance to and progress with PT, will update PRN            Plan - 11/14/18 1405    Clinical Impression Statement  Ms. Habig arrives today reporting that she had severe pain in her back after last session, she was up all night that night. Re-assessment shows some increase in functional strength however she has not otherwise made progress towards her goals at this time. She reports that and becomes tearful when talking about not being able to walk on the beach, however then became very jovial and laughing when talking about her past shoulder surgery and how much she liked her surgeon. Extensive education provided regarding how hurt does not equal harm, benefits of mobility and strengthening, and potential benefits of PT moving forward especially as exercises being performed are not physically capable of causing harm. Recommend trial of skilled PT services as patient has made some objective improvement, will plan for DC at next re-assessment if there is no further improvement at that time.    Personal Factors and Comorbidities  Age;Fitness;Behavior Pattern;Past/Current Experience;Comorbidity 3+;Sex;Social Background;Education;Finances;Time since onset of injury/illness/exacerbation    Comorbidities  obesity, bipolar, duration of back pain    Examination-Activity Limitations  Transfers;Locomotion Level;Bed Mobility;Reach  Overhead;Bend;Caring for Others;Sit;Sleep;Squat;Stairs;Stand;Lift    Examination-Participation Restrictions  Church;Medication Management;Yard Work;Cleaning;Meal Prep;Community Activity;Personal Finances;Driving;School;Interpersonal Relationship;Shop;Laundry;Volunteer    Stability/Clinical Decision Making  Evolving/Moderate complexity    Clinical Decision Making  High  Rehab Potential  Fair    PT Frequency  2x / week    PT Duration  3 weeks    PT Treatment/Interventions  ADLs/Self Care Home Management;Aquatic Therapy;Biofeedback;Cryotherapy;Electrical Stimulation;Iontophoresis 68m/ml Dexamethasone;Moist Heat;Traction;Ultrasound;DME Instruction;Gait training;Stair training;Functional mobility training;Therapeutic activities;Therapeutic exercise;Balance training;Neuromuscular re-education;Patient/family education;Passive range of motion;Manual techniques;Dry needling;Energy conservation;Taping;Visual/perceptual remediation/compensation;Spinal Manipulations;Joint Manipulations    PT Next Visit Plan  continue functional strength  and mobility as tolerated    PT Home Exercise Plan  Eval: meditation routine, glute sets, TA sets, TA sets with march    Consulted and Agree with Plan of Care  Patient       Patient will benefit from skilled therapeutic intervention in order to improve the following deficits and impairments:  Abnormal gait, Decreased coordination, Decreased range of motion, Difficulty walking, Increased fascial restricitons, Decreased endurance, Decreased activity tolerance, Impaired perceived functional ability, Pain, Decreased balance, Impaired flexibility, Hypomobility, Improper body mechanics, Decreased mobility, Decreased strength, Impaired sensation, Postural dysfunction  Visit Diagnosis: Difficulty in walking, not elsewhere classified - Plan: PT plan of care cert/re-cert  Other symptoms and signs involving the musculoskeletal system - Plan: PT plan of care cert/re-cert  Muscle  weakness (generalized) - Plan: PT plan of care cert/re-cert  Chronic bilateral low back pain with bilateral sciatica - Plan: PT plan of care cert/re-cert     Problem List Patient Active Problem List   Diagnosis Date Noted  . Bone lesion 09/25/2018  . Nocturnal hypoxia 10/04/2017  . Leg pain, bilateral 07/15/2017  . Chronic venous insufficiency 04/12/2017  . Acute respiratory failure with hypoxia ([place] 10/18/2015  . COPD exacerbation ([place] 10/18/2015  . Drug reaction 07/23/2015  . Fever of unknown origin 07/22/2015  . Lactic acidosis 07/22/2015  . Hypokalemia 07/22/2015  . Nausea and vomiting 07/22/2015  . Chronic back pain 07/22/2015  . Abdominal pain 07/26/2014  . Nausea without vomiting 07/26/2014  . History of colon cancer 07/26/2014  . Iron deficiency anemia 07/07/2014  . Vitamin D deficiency 07/07/2014  . Abnormal nuclear stress test 03/31/2014  . Abnormal cardiac CT angiography 03/31/2014  . Hyperlipidemia 03/24/2014  . Morbid obesity ([place]   . Chest pain of uncertain etiology 146/65/9935 . COPD (chronic obstructive pulmonary disease) ([place] 03/23/2014  . Leukocytosis 03/23/2014  . Hyperglycemia 03/23/2014  . Bipolar 1 disorder ([place] 03/23/2014  . Pruritic rash 03/23/2014  . Chronic anxiety 03/23/2014  . Tobacco abuse 03/23/2014  . Intractable migraine without aura and with status migrainosus 01/14/2014  . Sinusitis 07/02/2013  . Urinary frequency 07/02/2013  . Migraine 07/02/2013  . Anxiety state, unspecified 04/14/2013  . Obesity 04/14/2013  . Anemia 04/14/2013  . CONSTIPATION 10/12/2009  . CARCINOMA IN SITU OF RECTUM 08/25/2009  . GERD 08/25/2009  . IRRITABLE BOWEL SYNDROME 08/25/2009  . DIARRHEA, CHRONIC 08/25/2009  . HEMANGIOMA, HEPATIC 08/24/2009  . POLYCYSTIC OVARIAN DISEASE 08/24/2009  . Personal history of other diseases of digestive system 08/24/2009    KDeniece ReePT, DPT, CBIS  Supplemental Physical Therapist [facility]   Pager  [phone] Rehab Office [id number] [address]Phone: [phone]  Fax:  [phone] Name: JJANELL KEELINGMRN: [id number] of Birth: [birth year]

## 2018-11-23 ENCOUNTER — Encounter: Payer: Self-pay | Admitting: Family Medicine

## 2018-11-25 ENCOUNTER — Telehealth (HOSPITAL_COMMUNITY): Payer: Self-pay

## 2018-11-25 ENCOUNTER — Ambulatory Visit: Payer: Commercial Managed Care - PPO | Admitting: Internal Medicine

## 2018-11-25 NOTE — Telephone Encounter (Signed)
She fell yesterday and will not come tomorrow-will try to be here Friday

## 2018-11-26 ENCOUNTER — Ambulatory Visit (HOSPITAL_COMMUNITY): Payer: Commercial Managed Care - PPO

## 2018-11-27 ENCOUNTER — Telehealth (HOSPITAL_COMMUNITY): Payer: Self-pay | Admitting: Family Medicine

## 2018-11-27 NOTE — Telephone Encounter (Signed)
11/27/18  pt called to cx said that she was hurting where she fell earlier in the week.  we confirmed the next appt 9/11 and she said hopefully she would be here

## 2018-11-28 ENCOUNTER — Ambulatory Visit (HOSPITAL_COMMUNITY): Payer: Commercial Managed Care - PPO | Admitting: Physical Therapy

## 2018-11-29 IMAGING — US US EXTREM LOW VENOUS BILAT
1 series · 13 of 24 positions shown · non-contrast
Comparison: None.

CLINICAL DATA: Bilateral lower extremity edema and pain, right
greater than left.



[Series 1: us extrem low venous bilat · 0.10mm/px · 13 of 64 slices shown]
[im 1/64]
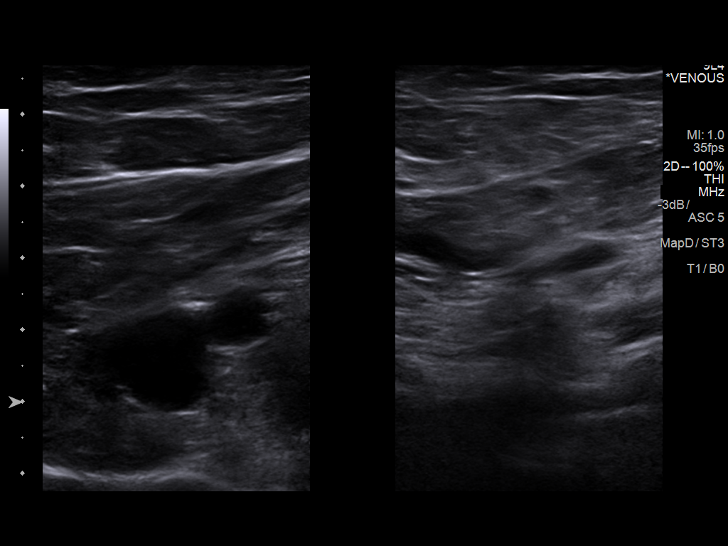
[im 6/64]
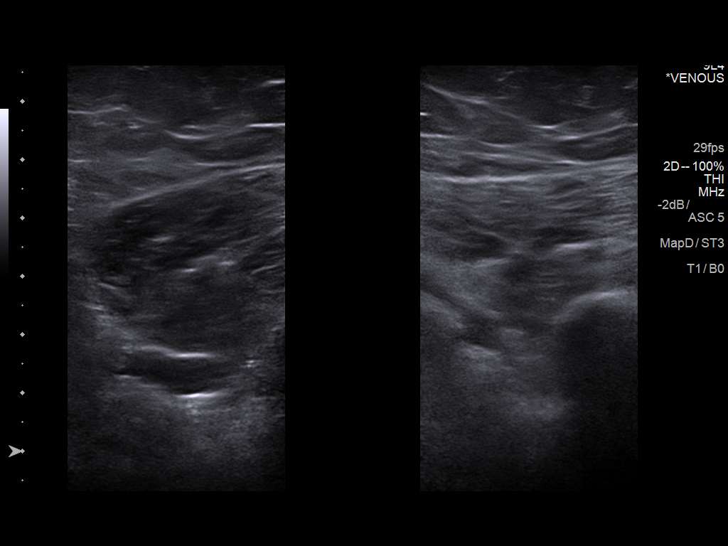
[im 11/64]
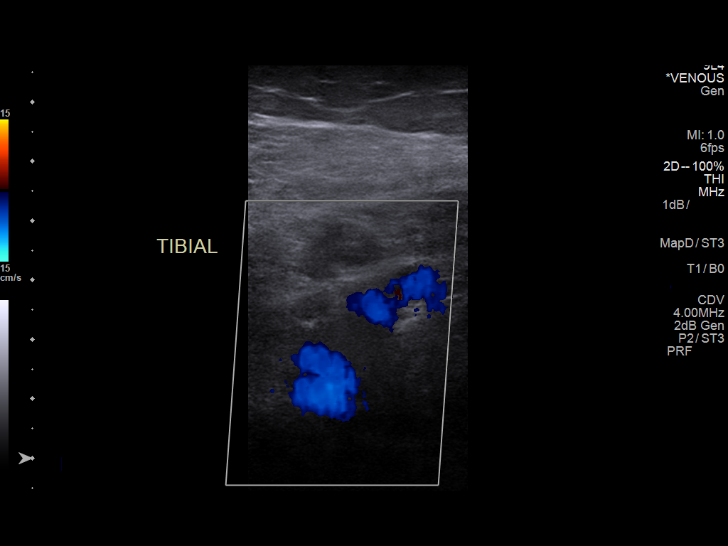
[im 17/64]
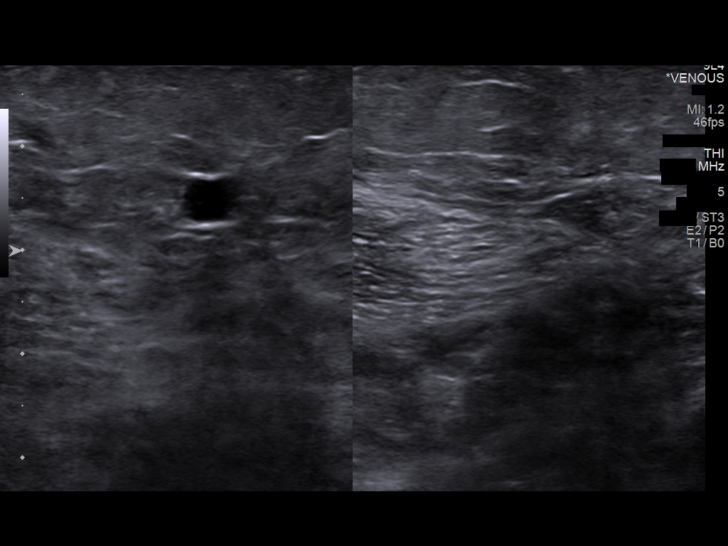
[im 22/64]
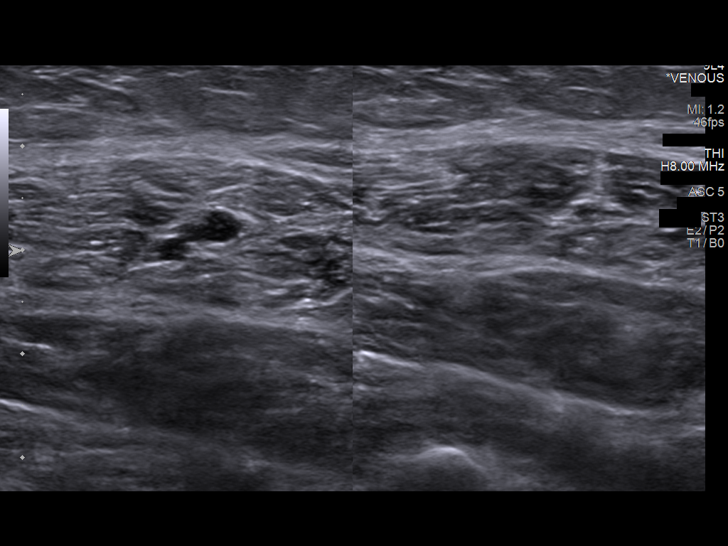
[im 28/64]
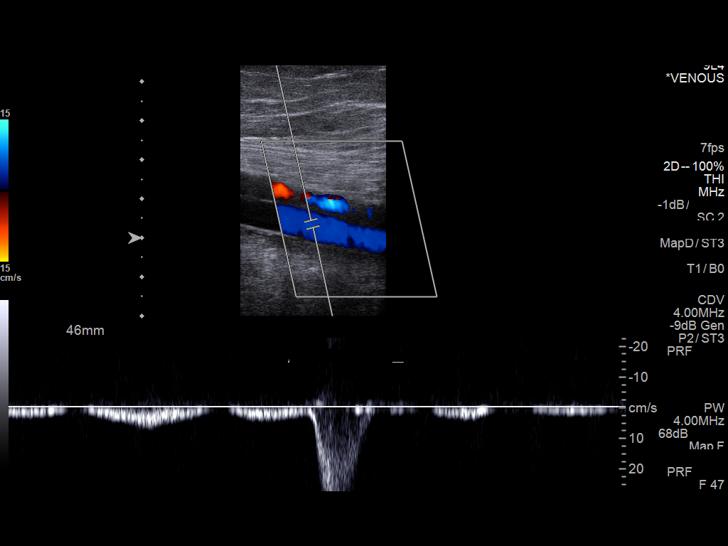
[im 33/64]
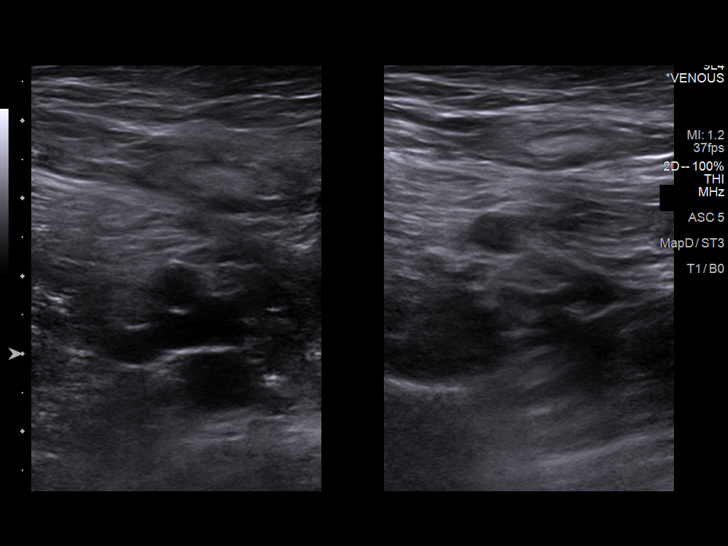
[im 36/64]
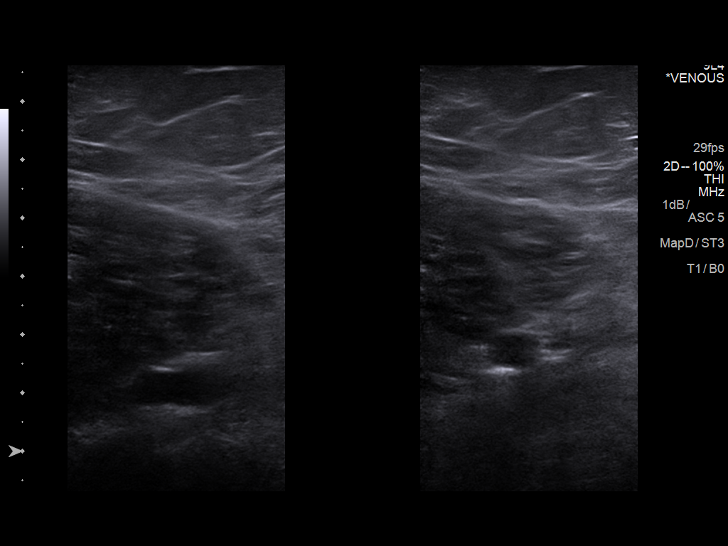
[im 42/64]
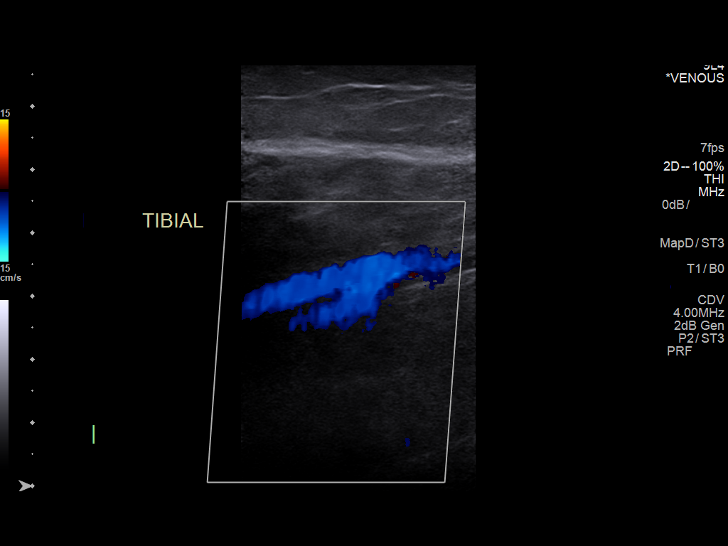
[im 47/64]
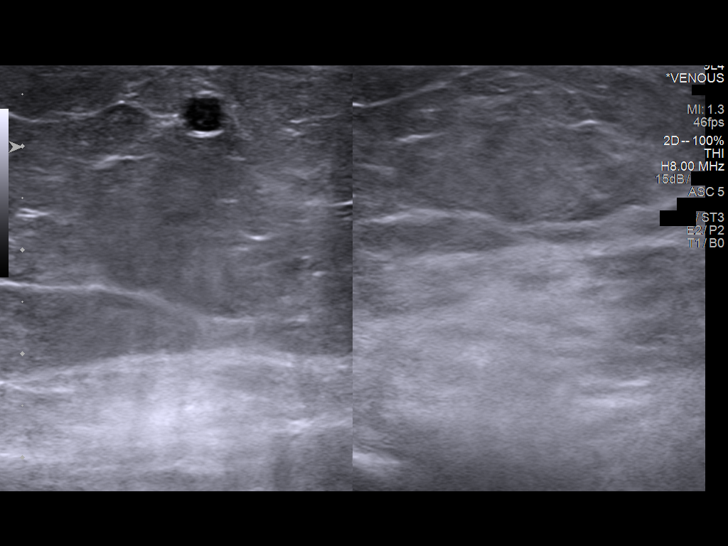
[im 53/64]
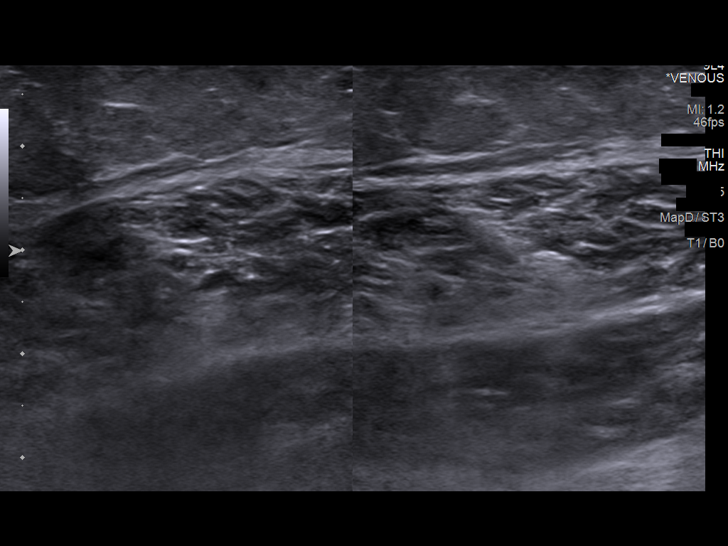
[im 58/64]
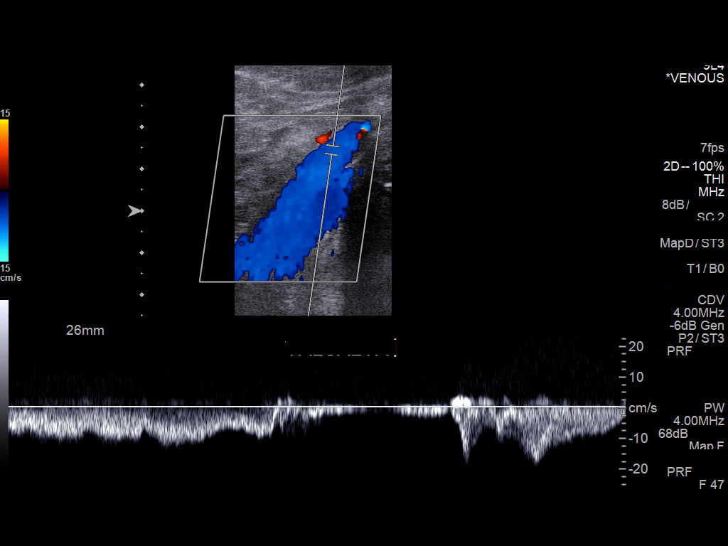
[im 64/64]
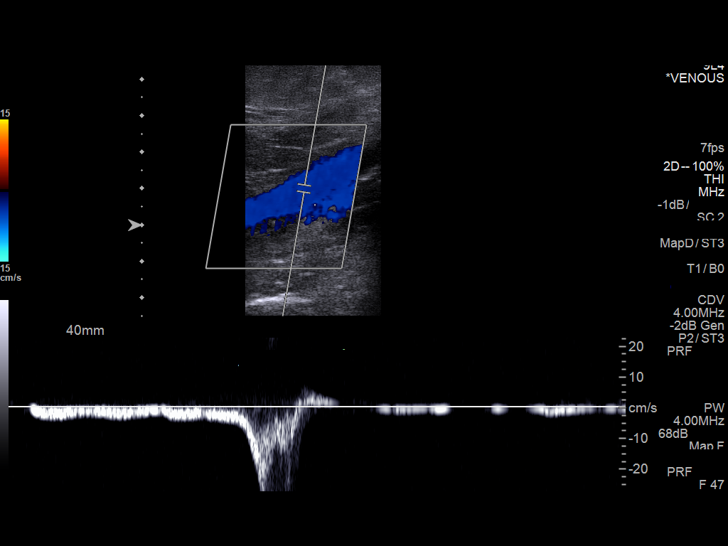

[13 of 24 positions shown; findings below may reference images not displayed]

FINDINGS: RIGHT LOWER EXTREMITY

Common Femoral Vein: No evidence of thrombus. Normal
compressibility, respiratory phasicity and response to augmentation.

Saphenofemoral Junction: No evidence of thrombus. Normal
compressibility and flow on color Doppler imaging.

Profunda Femoral Vein: No evidence of thrombus. Normal
compressibility and flow on color Doppler imaging.

Femoral Vein: No evidence of thrombus. Normal compressibility,
respiratory phasicity and response to augmentation.

Popliteal Vein: No evidence of thrombus. Normal compressibility,
respiratory phasicity and response to augmentation.

Calf Veins: No evidence of thrombus. Normal compressibility and flow
on color Doppler imaging.

Superficial Great Saphenous Vein: No evidence of thrombus. Normal
compressibility.

Venous Reflux:  None.

Other Findings: No evidence of superficial thrombophlebitis or
abnormal fluid collection.

LEFT LOWER EXTREMITY

Common Femoral Vein: No evidence of thrombus. Normal
compressibility, respiratory phasicity and response to augmentation.

Saphenofemoral Junction: No evidence of thrombus. Normal
compressibility and flow on color Doppler imaging.

Profunda Femoral Vein: No evidence of thrombus. Normal
compressibility and flow on color Doppler imaging.

Femoral Vein: No evidence of thrombus. Normal compressibility,
respiratory phasicity and response to augmentation.

Popliteal Vein: No evidence of thrombus. Normal compressibility,
respiratory phasicity and response to augmentation.

Calf Veins: No evidence of thrombus. Normal compressibility and flow
on color Doppler imaging.

Superficial Great Saphenous Vein: No evidence of thrombus. Normal
compressibility.

Venous Reflux:  None.

Other Findings: No evidence of superficial thrombophlebitis or
abnormal fluid collection.
IMPRESSION: No evidence of bilateral lower extremity deep venous thrombosis.

## 2018-12-02 ENCOUNTER — Telehealth: Payer: Self-pay | Admitting: Pulmonary Disease

## 2018-12-02 ENCOUNTER — Ambulatory Visit: Payer: Commercial Managed Care - PPO

## 2018-12-04 ENCOUNTER — Other Ambulatory Visit: Payer: Self-pay | Admitting: *Deleted

## 2018-12-04 ENCOUNTER — Ambulatory Visit (INDEPENDENT_AMBULATORY_CARE_PROVIDER_SITE_OTHER): Payer: Commercial Managed Care - PPO | Admitting: Internal Medicine

## 2018-12-04 ENCOUNTER — Ambulatory Visit (INDEPENDENT_AMBULATORY_CARE_PROVIDER_SITE_OTHER): Payer: Commercial Managed Care - PPO | Admitting: Family Medicine

## 2018-12-04 ENCOUNTER — Encounter: Payer: Self-pay | Admitting: *Deleted

## 2018-12-04 ENCOUNTER — Encounter: Payer: Self-pay | Admitting: Internal Medicine

## 2018-12-04 ENCOUNTER — Other Ambulatory Visit: Payer: Self-pay

## 2018-12-04 VITALS — BP 140/88 | HR 105 | Temp 96.2°F | Ht 60.0 in | Wt 265.0 lb

## 2018-12-04 DIAGNOSIS — D012 Carcinoma in situ of rectum: Secondary | ICD-10-CM | POA: Diagnosis not present

## 2018-12-04 DIAGNOSIS — E782 Mixed hyperlipidemia: Secondary | ICD-10-CM

## 2018-12-04 DIAGNOSIS — Z85048 Personal history of other malignant neoplasm of rectum, rectosigmoid junction, and anus: Secondary | ICD-10-CM

## 2018-12-04 LAB — LDL CHOLESTEROL, DIRECT: Direct LDL: 177 mg/dL

## 2018-12-04 LAB — LIPID PANEL
Cholesterol: 247 mg/dL — ABNORMAL HIGH (ref 0–200)
HDL: 38.5 mg/dL — ABNORMAL LOW (ref >39.00)
NonHDL: 208.24
Total CHOL/HDL Ratio: 6
Triglycerides: 324 mg/dL — ABNORMAL HIGH (ref 0.0–149.0)
VLDL: 64.8 mg/dL — ABNORMAL HIGH (ref 0.0–40.0)

## 2018-12-04 LAB — HEPATIC FUNCTION PANEL
ALT: 9 U/L (ref 0–35)
AST: 8 U/L (ref 0–37)
Albumin: 4.2 g/dL (ref 3.5–5.2)
Alkaline Phosphatase: 93 U/L (ref 39–117)
Bilirubin, Direct: 0.1 mg/dL (ref 0.0–0.3)
Total Bilirubin: 0.4 mg/dL (ref 0.2–1.2)
Total Protein: 6.6 g/dL (ref 6.0–8.3)

## 2018-12-04 NOTE — Telephone Encounter (Signed)
Scheduled pft 12/31/2018-pr

## 2018-12-04 NOTE — Patient Instructions (Addendum)
Schedule a surveillance colonoscopy (hx of rectal cancer) - propofol  Need 2 days of clear liquids  1 Bottle of citrate of magnesium at 9am and 1 at 3pm the day before the colonoscopy   2 tap water enemas the day of the procedure  Please retrieve 2000 colonoscopy(and f/u procedure) and Bx reports for Doctors review

## 2018-12-04 NOTE — Progress Notes (Signed)
Primary Care Physician:  Tammi Sou, MD Primary Gastroenterologist:  Dr. Gala Romney  Pre-Procedure History & Physical: HPI:  Martha Espinoza is a 53 y.o. female here to set up a surveillance colonoscopy.  This lady presented with a 2-year history of rectal bleeding back around 2000(age 68 at diagnosis)..  Colonoscopy that time revealed a large rectal polyp which I removed.  Both the procedure note and pathology report are not in our system.  However, by history, she had at least carcinoma in situ.  Ultimately, this lesion was completely resected endoscopically.  She had serial colonoscopies.  Her last colonoscopy was in 2009 at which time at least one adenoma was removed.  Currently, she is devoid of any lower GI tract symptoms aside from constipation.  She takes numerous medications (see below).  She is chronically constipated in spite of taking Symproic and Amitiza daily.  She barely has 1 bowel movement daily.  No rectal bleeding or abdominal pain.  She chronically strains to have a bowel movement. She is markedly overdue for surveillance examination.  It is notable that her father was diagnosed with colon cancer at age 63.   Past Medical History:  Diagnosis Date  . Abnormal CT of thoracic spine 09/12/2018   Noncontrast.  Increased in number and TINY increase in size of tiny, patchy sclerotic T spine lesions compared to 2018 CT.  Hem/onc summer 2020->w/u->suspected benign bone islands->survellance imaging in 6-12 mo planned.  . Arthritis   . Asthma   . Bipolar 1 disorder ([place])   . CAD (coronary artery disease) 08/2018   1 V CAD noted on noncontrast CT chest 08/2018  . Cancer [facility])    Rectal Cancer 2000  . Chronic pain syndrome    chronic low back pain (Pain mgmt= Dr. [name]).  MRI showed no signif disc dz, only showed some facet arthropathy at L4-5, with joint diastasis at L4-5--plan as of 11/07/16 neurosurg eval is bilat facet injections.  . Colitis due to Clostridium difficile 2001   . COPD (chronic obstructive pulmonary disease) ([place])    PFTs 04/2016 showed no sign of copd or asthma, plus she actually had worse FEV1 after taking albuterol.  However, as of 06/2018 she has recent hx of improvement in sx's after taking albuterol.  LONG TIME SMOKER, won't quit.  Intolerant to or cannot afford: pulmicort, QVAR, advair, symbicort, flovent, bevespi, and spiriva (as of 06/2018).  . Depression   . GAD (generalized anxiety disorder)   . GERD (gastroesophageal reflux disease)   . Hepatic hemangioma   . Hepatic steatosis 06/2016   Noted incidentally on CT chest  . History of Salmonella gastroenteritis   . Hyperlipidemia    a. Noted 02/2014.  Marland Kitchen Hypertension    HCTZ started 07/2015  . IBS (irritable bowel syndrome)   . Iron deficiency anemia 05/2014   per pt it is not due to vaginal blood loss; hemoccults sent to pt in mail 413/16.  . Lumbar spondylosis    mild, mainly focused at L4-5 and L5-S1.  MRI L spine 09/09/17-->mild degenerative disc disease and facet arthrosis without any nerve root encroachment, no spinal stenosis.  . Microscopic hematuria    Intermittent (no w/u done yet, as of 03/2014)  . Migraine headache   . Morbid obesity ([place])   . Multiple lipomas 09/2016   left upper arm--very small ones.  . Nocturnal hypoxia 09/18/2017   Overnight oximetry -->qualified: ordered 2L oxygen during sleep.  . Obesity hypoventilation syndrome ([place])   .  Pain in both lower legs 2018-2019   pain and numbness bilat LLs.  NCS/EMGs findings suggestive of S1 radiculopathy bilat: sx's + NCS findings suggestive of neurogenic claudication/spinal stenosis.  ? contribution of venous reflux?  . Peripheral edema Fall 2018   R>L, non-pitting for the most part--venous doppler neg for DVT 01/2017.  Saw Dr. Bridgett Larsson (Vasc) 04/12/17 and u/s showed some venous reflux dz but he felt her sx's in legs were NOT due to her venous insufficiency OR PAD.  Thigh high comp stockings rx'd.  . Polycystic ovarian syndrome    . Pruritic dermatitis 2015/2016   +Scabies prep at [facility] 07/2014. (Primarily pruritic skin, but eventually a subtle rash as well)  . Raynaud's phenomenon 2019  . Rectal cancer ([place])    a. Followed by Dr. Gala Romney, dx 2000-2001. Tumor removed from rectal.  . Recurrent sinusitis    +allergic rhinitis  . Right shoulder pain 2017   Murphy/Wainer: RC bursitis + RC tear (MRI)--injection trial is plan per pt report 05/04/16.  . Tobacco dependence   . Vitamin D deficiency     Past Surgical History:  Procedure Laterality Date  . ABI's complete Bilateral 09/30/2017   Normal ABIs.  Great toe pressures adequate for wound healing.  Femoral artery waveforms triphasic (normal).  . CARDIOVASCULAR STRESS TEST  03/24/14   Lexscan MIBI: mild anterior ischemia?  Cardiac CT angiogram recommended/done.  . CHOLECYSTECTOMY    . COLONOSCOPY  05/05/2007   adenoma  . coronary CT angio  03/29/14   Two vessel dz/moderate stenosis of mid LAD and proximal RCA; cath recommended.  Marland Kitchen DILATION AND CURETTAGE OF UTERUS     for vaginal bleeding  . JOINT REPLACEMENT    . LEFT HEART CATHETERIZATION WITH CORONARY ANGIOGRAM N/A 03/31/2014   Normal coronaries, EF 0000000, diastolic dysfunction.  Procedure: LEFT HEART CATHETERIZATION WITH CORONARY ANGIOGRAM;  Surgeon: Sinclair Grooms, MD;  Location: [facility] CATH LAB;  Service: Cardiovascular;  Laterality: N/A;  . LOWER EXT VENOUS DOPPLERS Bilateral 02/08/2017   NEG for DVT (bilat)  . NCS/EMG  10/03/2017   S1 radiculopathy bilat  . Overnight oximetry testing  09/2017   qualified for oxygen supplementation during sleep  . PFTs  04/2016   No sign of COPD or asthma; FEV 1 worse after albut.  . rectal mss  age 70   High-grade rectal adenoma removed from rectum   . TRANSTHORACIC ECHOCARDIOGRAM  03/23/14; 10/21/17   2015 Normal.  2019: mild LVH, EF 65-70%, study technically inadequate to assess diastolic function, valves were fine, wall motion normal.    Prior to Admission  medications   Medication Sig Start Date End Date Taking? Authorizing Provider  albuterol (PROVENTIL) (2.5 MG/3ML) 0.083% nebulizer solution Take 3 mLs (2.5 mg total) by nebulization every 6 (six) hours as needed for wheezing or shortness of breath. 08/27/18 08/27/19 Yes McGowen, Adrian Blackwater, MD  albuterol (VENTOLIN HFA) 108 (90 Base) MCG/ACT inhaler INHALE 1 TO 2 PUFFS BY MOUTH EVERY 4 HOURS AS NEEDED 10/31/18  Yes McGowen, Adrian Blackwater, MD  AMITIZA 24 MCG capsule Take 24 mcg by mouth 2 (two) times daily. 01/01/18  Yes [provider]  aspirin 81 MG chewable tablet Chew 1 tablet (81 mg total) by mouth daily. 09/06/12  Yes Ripley Fraise, MD  azelastine (ASTELIN) 0.1 % nasal spray Place 2 sprays into both nostrils 2 (two) times daily. 07/26/17  Yes McGowen, Adrian Blackwater, MD  busPIRone (BUSPAR) 15 MG tablet 1 in the morning and 2  at bedtime   Yes [provider]  cetirizine (ZYRTEC) 10 MG tablet TAKE 1 TABLET BY MOUTH ONCE DAILY 01/21/18  Yes McGowen, Adrian Blackwater, MD  clonazePAM (KLONOPIN) 0.5 MG tablet TAKE 1 TABLET BY MOUTH DURING THE DAY AS NEEDED AND 2 TABLETS AT BEDTIME 10/27/18  Yes McGowen, Adrian Blackwater, MD  cyclobenzaprine (FLEXERIL) 10 MG tablet TAKE ONE TABLET BY MOUTH EVERY 6 HOURS AS NEEDED (SPASM RELATED TO HEADACHES) Patient taking differently: Take 10 mg by mouth 3 (three) times daily. TAKE ONE TABLET BY MOUTH EVERY 6 HOURS AS NEEDED (SPASM RELATED TO HEADACHES) 07/10/16  Yes McGowen, Adrian Blackwater, MD  diphenhydrAMINE (BENADRYL) 25 MG tablet Take 1 tablet (25 mg total) by mouth every 6 (six) hours. Patient taking differently: Take 25-50 mg by mouth every 6 (six) hours as needed for itching or allergies.  07/19/15  Yes Fredia Sorrow, MD  fluticasone (FLONASE) 50 MCG/ACT nasal spray Place 2 sprays into both nostrils daily. 08/24/14  Yes Ward, Delice Bison, DO  hydrOXYzine (ATARAX/VISTARIL) 25 MG tablet Take 25 mg by mouth 3 (three) times daily.    Yes [provider]  IRON-FOLIC ACID PO Take 65 mg  by mouth daily.    Yes [provider]  lamoTRIgine (LAMICTAL) 100 MG tablet Take 200 mg by mouth at bedtime. 03/06/18  Yes [provider]  losartan (COZAAR) 50 MG tablet Take 1 tablet by mouth once daily 09/25/18  Yes McGowen, Adrian Blackwater, MD  LYRICA 150 MG capsule Take 1 tablet by mouth 3 (three) times daily. 07/23/17  Yes [provider]  Magnesium-Potassium 40-40 MG CAPS Take 1 capsule by mouth daily.   Yes [provider]  meloxicam (MOBIC) 15 MG tablet Take 15 mg by mouth daily. 01/01/18  Yes [provider]  metoprolol tartrate (LOPRESSOR) 50 MG tablet Take 1 tablet by mouth twice daily 09/25/18  Yes McGowen, Adrian Blackwater, MD  Naldemedine Tosylate (SYMPROIC) 0.2 MG TABS Take 1 tablet by mouth daily.   Yes [provider]  nitroGLYCERIN (NITROSTAT) 0.4 MG SL tablet DISSOLVE ONE TABLET UNDER THE TONGUE EVERY 5 MINUTES AS NEEDED FOR CHEST PAIN.  DO NOT EXCEED A TOTAL OF 3 DOSES IN 15 MINUTES 11/13/18  Yes McGowen, Adrian Blackwater, MD  oxyCODONE-acetaminophen (PERCOCET) 10-325 MG tablet Take 1 tablet by mouth 3 (three) times daily.    Yes [provider]  pantoprazole (PROTONIX) 40 MG tablet Take 1 tablet (40 mg total) by mouth 2 (two) times daily. 09/30/18  Yes McGowen, Adrian Blackwater, MD  pravastatin (PRAVACHOL) 40 MG tablet Take 1 tablet (40 mg total) by mouth daily. 09/29/18  Yes McGowen, Adrian Blackwater, MD  predniSONE (DELTASONE) 5 MG tablet Take 1 tablet by mouth once daily with breakfast 10/27/18  Yes McGowen, Adrian Blackwater, MD  pregabalin (LYRICA) 200 MG capsule Take 200 mg by mouth at bedtime. 06/25/18  Yes [provider]  promethazine (PHENERGAN) 25 MG tablet 1 tab po q6h prn nausea 11/05/18  Yes McGowen, Adrian Blackwater, MD  rOPINIRole (REQUIP) 0.25 MG tablet TAKE 3 TABLETS BY MOUTH AT BEDTIME 09/25/18  Yes McGowen, Adrian Blackwater, MD  citalopram (CELEXA) 40 MG tablet Take 20 mg by mouth at bedtime.   06/18/11  [provider]  omeprazole (PRILOSEC) 20 MG capsule  Take 40 mg by mouth daily.  06/18/11  [provider]    Allergies as of 12/04/2018 - Review Complete 12/04/2018  Allergen Reaction Noted  . Contrast media [iodinated diagnostic agents] Anaphylaxis 03/31/2014  .  Shellfish allergy Anaphylaxis 11/15/2010  . Codeine Nausea And Vomiting and Other (See Comments)   . Furosemide Rash 02/18/2017  . Anoro ellipta [umeclidinium-vilanterol] Other (See Comments) 08/20/2018  . Flovent diskus [fluticasone propionate (inhal)]  12/04/2018  . Ivermectin Nausea And Vomiting 07/23/2015  . Spiriva handihaler [tiotropium bromide monohydrate] Other (See Comments) 05/01/2018  . Symbicort [budesonide-formoterol fumarate]  12/04/2018  . Betadine [povidone iodine] Rash 05/24/2015  . Latex Hives 04/24/2011  . Lipitor [atorvastatin] Itching 04/18/2014  . Permethrin Itching 04/18/2014  . Stiolto respimat [tiotropium bromide-olodaterol] Other (See Comments) 09/24/2018  . Tape Dermatitis 05/24/2015  . Trelegy ellipta [fluticasone-umeclidin-vilant] Other (See Comments) 09/24/2018    Family History  Problem Relation Age of Onset  . Colon cancer Father 38  . Cancer Father   . Heart disease Mother   . Hypertension Mother   . Hyperlipidemia Mother   . Mental illness Mother   . Diabetes Mother   . Heart attack Mother   . Thyroid disease Mother   . Colon cancer Maternal Grandfather   . Stroke Neg Hx     Social History   Socioeconomic History  . Marital status: Married    Spouse name: Not on file  . Number of children: 1  . Years of education: Not on file  . Highest education level: Not on file  Occupational History  . Occupation: own Games developer: [employer]  . Financial resource strain: Not hard at all  . Food insecurity    Worry: Never true    Inability: Never true  . Transportation needs    Medical: No    Non-medical: No  Tobacco Use  . Smoking status: Current Every Day Smoker    Packs/day:  0.50    Years: 18.00    Pack years: 9.00    Types: Cigarettes  . Smokeless tobacco: Never Used  . Tobacco comment: 25 years  Substance and Sexual Activity  . Alcohol use: No    Alcohol/week: 0.0 standard drinks  . Drug use: No  . Sexual activity: Never    Birth control/protection: None, Surgical  Lifestyle  . Physical activity    Days per week: 0 days    Minutes per session: 0 min  . Stress: Very much  Relationships  . Social Herbalist on phone: Once a week    Gets together: Once a week    Attends religious service: Never    Active member of club or organization: No    Attends meetings of clubs or organizations: Never    Relationship status: Married  . Intimate partner violence    Fear of current or ex partner: No    Emotionally abused: No    Physically abused: No    Forced sexual activity: No  Other Topics Concern  . Not on file  Social History Narrative  . Not on file    Review of Systems: See HPI, otherwise negative ROS  Physical Exam: BP 140/88   Pulse (!) 105   Temp (!) 96.2 F (35.7 C) (Temporal)   Ht 5' (1.524 m)   Wt 265 lb (120.2 kg)   LMP 11/21/2018 (Approximate)   BMI 51.75 kg/m  General:   Alert,  Well-developed, well-nourished, pleasant and cooperative in NAD.  Obese. Neck:  Supple; no masses or thyromegaly. No significant cervical adenopathy. Lungs:  Clear throughout to auscultation.   No wheezes, crackles, or rhonchi. No acute distress. Heart:  Regular rate and  rhythm; no murmurs, clicks, rubs,  or gallops. Abdomen: Obese..  Soft and nontender without appreciable mass or hepatosplenomegaly.  Pulses:  Normal pulses noted. Extremities:  Without clubbing or edema. Rectal: Deferred until the time of colonoscopy.  Impression/Plan: 53 year old lady with a history of at least carcinoma in situ and a large rectal polyp removed at age 30.  Specifics of endoscopy findings and pathology currently not available as records cannot be found. A  couple of surveillance exams since that time but none in 11 years.  No lower GI tract symptoms.  She is significantly overdue for surveillance colonoscopy.  She is chronically constipated at baseline.  Renal function is normal.  Recommendations: I have offered the patient a surveillance colonoscopy utilizing propofol. Schedule a surveillance colonoscopy (hx of rectal cancer) - propofol.  Patient has reminded me that she did adequately prepped utilizing magnesium citrate previously and that is what she would like to use for her upcoming colonoscopy. The risks, benefits, limitations, alternatives and imponderables have been reviewed with the patient. Questions have been answered. All parties are agreeable.   Need 2 days of clear liquids  1 Bottle of citrate of magnesium at 9am and 1 at 3pm the day before the colonoscopy   2 tap water enemas the day of the procedure  Smoking cessation recommended.  Please retrieve 2000 colonoscopy(and f/u procedure) and Bx reports for Doctors review     Notice: This dictation was prepared with Dragon dictation along with smaller phrase technology. Any transcriptional errors that result from this process are unintentional and may not be corrected upon review.

## 2018-12-05 ENCOUNTER — Telehealth: Payer: Self-pay | Admitting: *Deleted

## 2018-12-05 ENCOUNTER — Telehealth (HOSPITAL_COMMUNITY): Payer: Self-pay | Admitting: Family Medicine

## 2018-12-05 ENCOUNTER — Ambulatory Visit (HOSPITAL_COMMUNITY): Payer: Commercial Managed Care - PPO

## 2018-12-05 ENCOUNTER — Encounter: Payer: Self-pay | Admitting: *Deleted

## 2018-12-05 NOTE — Telephone Encounter (Signed)
12/05/18  husband called to cx saying she wasn't feeling well

## 2018-12-05 NOTE — Telephone Encounter (Signed)
PA submitted via [facility] website. Case ID# [id number]

## 2018-12-05 NOTE — Telephone Encounter (Signed)
[place]

## 2018-12-08 DIAGNOSIS — F3181 Bipolar II disorder: Secondary | ICD-10-CM | POA: Diagnosis not present

## 2018-12-09 ENCOUNTER — Telehealth (HOSPITAL_COMMUNITY): Payer: Self-pay | Admitting: Family Medicine

## 2018-12-09 ENCOUNTER — Ambulatory Visit (HOSPITAL_COMMUNITY): Payer: Commercial Managed Care - PPO

## 2018-12-09 NOTE — Telephone Encounter (Signed)
12/09/18  husband called and left message to cx said that she wasn't feeling good from the fall she took

## 2018-12-10 ENCOUNTER — Encounter: Payer: Self-pay | Admitting: Family Medicine

## 2018-12-11 ENCOUNTER — Other Ambulatory Visit: Payer: Self-pay

## 2018-12-11 MED ORDER — PRAVASTATIN SODIUM 80 MG PO TABS: 40.0000 mg | ORAL_TABLET | Freq: Every day | ORAL | 6 refills | Status: DC

## 2018-12-12 ENCOUNTER — Telehealth (HOSPITAL_COMMUNITY): Payer: Self-pay | Admitting: Family Medicine

## 2018-12-12 ENCOUNTER — Ambulatory Visit (HOSPITAL_COMMUNITY): Payer: Commercial Managed Care - PPO | Admitting: Physical Therapy

## 2018-12-12 NOTE — Telephone Encounter (Signed)
12/12/18  pt called to cx her appts she said that she sees the dr on 9/28 and will call back to let us know what she would like to do

## 2018-12-15 ENCOUNTER — Telehealth: Payer: Self-pay | Admitting: Family Medicine

## 2018-12-15 ENCOUNTER — Other Ambulatory Visit: Payer: Self-pay

## 2018-12-15 MED ORDER — PRAVASTATIN SODIUM 80 MG PO TABS: 80.0000 mg | ORAL_TABLET | Freq: Every day | ORAL | 6 refills | Status: DC

## 2018-12-15 NOTE — Telephone Encounter (Signed)
Patient will be available after 11:00. Please call her back about the change in dosage for her cholesterol medication.

## 2018-12-15 NOTE — Telephone Encounter (Signed)
Spoke with patient and she explained sig on med sent in was incorrect. Checked pt's chart and saw it was incorrect as well. Contacted pt's pharmacy and cancelled Rx sent and sent new one in. Pt was notified of correction.

## 2018-12-16 ENCOUNTER — Ambulatory Visit (HOSPITAL_COMMUNITY): Payer: Commercial Managed Care - PPO | Admitting: Physical Therapy

## 2018-12-22 DIAGNOSIS — G603 Idiopathic progressive neuropathy: Secondary | ICD-10-CM | POA: Diagnosis not present

## 2018-12-22 DIAGNOSIS — M797 Fibromyalgia: Secondary | ICD-10-CM | POA: Diagnosis not present

## 2018-12-22 DIAGNOSIS — R252 Cramp and spasm: Secondary | ICD-10-CM | POA: Diagnosis not present

## 2018-12-22 DIAGNOSIS — M79606 Pain in leg, unspecified: Secondary | ICD-10-CM | POA: Diagnosis not present

## 2018-12-23 ENCOUNTER — Other Ambulatory Visit: Payer: Self-pay | Admitting: Pulmonary Disease

## 2018-12-23 ENCOUNTER — Telehealth: Payer: Self-pay | Admitting: Pulmonary Disease

## 2018-12-24 NOTE — Telephone Encounter (Signed)
Patient was scheduled for COVID testing on 12/23/2018 for upcoming pft. Nothing further needed at this time.

## 2018-12-26 ENCOUNTER — Other Ambulatory Visit (HOSPITAL_COMMUNITY)
Admission: RE | Admit: 2018-12-26 | Discharge: 2018-12-26 | Disposition: A | Discharge status: Home / Self Care | Payer: Commercial Managed Care - PPO | Source: Ambulatory Visit | Attending: Pulmonary Disease | Admitting: Pulmonary Disease

## 2018-12-26 ENCOUNTER — Telehealth: Payer: Self-pay | Admitting: Pulmonary Disease

## 2018-12-26 ENCOUNTER — Other Ambulatory Visit: Payer: Self-pay

## 2018-12-26 DIAGNOSIS — Z01812 Encounter for preprocedural laboratory examination: Secondary | ICD-10-CM | POA: Diagnosis not present

## 2018-12-26 DIAGNOSIS — Z20828 Contact with and (suspected) exposure to other viral communicable diseases: Secondary | ICD-10-CM | POA: Diagnosis not present

## 2018-12-26 LAB — SARS CORONAVIRUS 2 (TAT 6-24 HRS): SARS Coronavirus 2: NEGATIVE

## 2018-12-26 NOTE — Telephone Encounter (Signed)
Called and spoke to patient.  Advised her that for her lab appointment to please social distance, wear mask, and use good hand hygiene. Patient verbalized understanding and will be going to get pre-procedure covid testing today.  Nothing further needed at this time.

## 2018-12-27 ENCOUNTER — Other Ambulatory Visit: Payer: Self-pay | Admitting: Family Medicine

## 2018-12-27 ENCOUNTER — Encounter: Payer: Self-pay | Admitting: Family Medicine

## 2018-12-30 ENCOUNTER — Inpatient Hospital Stay (HOSPITAL_COMMUNITY): Payer: Commercial Managed Care - PPO

## 2018-12-31 ENCOUNTER — Other Ambulatory Visit: Payer: Self-pay

## 2018-12-31 ENCOUNTER — Ambulatory Visit (INDEPENDENT_AMBULATORY_CARE_PROVIDER_SITE_OTHER): Payer: Commercial Managed Care - PPO | Admitting: Pulmonary Disease

## 2018-12-31 ENCOUNTER — Telehealth: Payer: Self-pay | Admitting: Pulmonary Disease

## 2018-12-31 ENCOUNTER — Encounter: Payer: Self-pay | Admitting: Pulmonary Disease

## 2018-12-31 VITALS — BP 120/72 | HR 94 | Temp 98.8°F | Ht 63.0 in | Wt 271.0 lb

## 2018-12-31 DIAGNOSIS — J449 Chronic obstructive pulmonary disease, unspecified: Secondary | ICD-10-CM | POA: Diagnosis not present

## 2018-12-31 DIAGNOSIS — Z79899 Other long term (current) drug therapy: Secondary | ICD-10-CM

## 2018-12-31 DIAGNOSIS — R490 Dysphonia: Secondary | ICD-10-CM | POA: Diagnosis not present

## 2018-12-31 LAB — PULMONARY FUNCTION TEST
DL/VA % pred: 130 %
DL/VA: 5.66 ml/min/mmHg/L
DLCO unc % pred: 75 %
DLCO unc: 15.26 ml/min/mmHg
FEF 25-75 Post: 0.95 L/sec
FEF 25-75 Pre: 2.42 L/sec
FEF2575-%Change-Post: -60 %
FEF2575-%Pred-Post: 35 %
FEF2575-%Pred-Pre: 90 %
FEV1-%Change-Post: -17 %
FEV1-%Pred-Post: 50 %
FEV1-%Pred-Pre: 60 %
FEV1-Post: 1.35 L
FEV1-Pre: 1.64 L
FEV1FVC-%Change-Post: -1 %
FEV1FVC-%Pred-Pre: 112 %
FEV6-%Change-Post: -16 %
FEV6-%Pred-Post: 46 %
FEV6-%Pred-Pre: 55 %
FEV6-Post: 1.52 L
FEV6-Pre: 1.82 L
FEV6FVC-%Change-Post: 0 %
FEV6FVC-%Pred-Post: 102 %
FEV6FVC-%Pred-Pre: 102 %
FVC-%Change-Post: -16 %
FVC-%Pred-Post: 44 %
FVC-%Pred-Pre: 53 %
FVC-Post: 1.52 L
FVC-Pre: 1.82 L
Post FEV1/FVC ratio: 89 %
Post FEV6/FVC ratio: 100 %
Pre FEV1/FVC ratio: 90 %
Pre FEV6/FVC Ratio: 100 %
RV % pred: 87 %
RV: 1.54 L
TLC % pred: 75 %
TLC: 3.7 L

## 2018-12-31 MED ORDER — BUDESONIDE 0.5 MG/2ML IN SUSP: 0.5000 mg | Freq: Two times a day (BID) | RESPIRATORY_TRACT | 11 refills | Status: DC

## 2018-12-31 MED ORDER — ALBUTEROL SULFATE HFA 108 (90 BASE) MCG/ACT IN AERS: INHALATION_SPRAY | RESPIRATORY_TRACT | 11 refills | Status: DC

## 2018-12-31 MED ORDER — FORMOTEROL FUMARATE 20 MCG/2ML IN NEBU: 20.0000 ug | INHALATION_SOLUTION | Freq: Two times a day (BID) | RESPIRATORY_TRACT | 11 refills | Status: DC

## 2018-12-31 NOTE — Progress Notes (Signed)
Martha Espinoza    [id number]    22-Oct-1965  Primary Care Physician:McGowen, Adrian Blackwater, MD  Referring Physician: Tammi Sou, MD [address] [place],  [place] [address]  Chief complaint: Follow-up for asthma, COPD  HPI: 53 year old heavy smoker with history of COPD, asthma, multiple psychiatric issues including bipolar disorder, multiple personality disorder, depression, suicidal ideation Complaints of persistent dyspnea, wheezing has chronic cough with white mucus.  She has received multiple rounds of prednisone in the past.  She has either not tolerated or has been unable to afford many controller inhalers, including QVAR, flovent, symbicort, advair, pulmicort, bevespi, and spiriva.  Most recently she was started on Anoro and Stiolto but had to stop as she developed mouth ulcers.  Currently just using albuterol as needed.  Also on supplemental oxygen at night Has seasonal allergies and acid reflux for which she is on PPI  Pets: No pets Occupation: Disabled Exposures: No known exposures, no hot tub, Jacuzzi Smoking history: 30-pack-year smoker.  Continues to smoke half pack per day Travel history: No significant travel history Relevant family history: No significant family history of lung disease  Interim history: Prescription Trelegy at last visit but she stopped taking it due to headache Continues to have dyspnea, wheezing.  Using albuterol several times a day.  Outpatient Encounter Medications as of 12/31/2018  Medication Sig  . albuterol (PROVENTIL) (2.5 MG/3ML) 0.083% nebulizer solution Take 3 mLs (2.5 mg total) by nebulization every 6 (six) hours as needed for wheezing or shortness of breath.  Marland Kitchen albuterol (VENTOLIN HFA) 108 (90 Base) MCG/ACT inhaler INHALE 1 TO 2 PUFFS BY MOUTH EVERY 4 HOURS AS NEEDED  . AMITIZA 24 MCG capsule Take 24 mcg by mouth 2 (two) times daily.  Marland Kitchen aspirin 81 MG chewable tablet Chew 1 tablet (81 mg total) by mouth daily.  Marland Kitchen  azelastine (ASTELIN) 0.1 % nasal spray Place 2 sprays into both nostrils 2 (two) times daily.  . busPIRone (BUSPAR) 15 MG tablet 15 mg. 1 in the morning, 1 mid day, and 2 at bedtime  . clonazePAM (KLONOPIN) 0.5 MG tablet TAKE 1 TABLET BY MOUTH DURING THE DAY AS NEEDED AND 2 TABLETS AT BEDTIME  . cyclobenzaprine (FLEXERIL) 10 MG tablet TAKE ONE TABLET BY MOUTH EVERY 6 HOURS AS NEEDED (SPASM RELATED TO HEADACHES) (Patient taking differently: Take 10 mg by mouth 3 (three) times daily. TAKE ONE TABLET BY MOUTH EVERY 6 HOURS AS NEEDED (SPASM RELATED TO HEADACHES))  . diphenhydrAMINE (BENADRYL) 25 MG tablet Take 1 tablet (25 mg total) by mouth every 6 (six) hours. (Patient taking differently: Take 25-50 mg by mouth every 6 (six) hours as needed for itching or allergies. )  . EQ ALLERGY RELIEF, CETIRIZINE, 10 MG tablet Take 1 tablet by mouth once daily  . fluticasone (FLONASE) 50 MCG/ACT nasal spray Place 2 sprays into both nostrils daily.  . hydrOXYzine (ATARAX/VISTARIL) 25 MG tablet Take 25 mg by mouth 3 (three) times daily.   Marland Kitchen IRON-FOLIC ACID PO Take 65 mg by mouth daily.   Marland Kitchen lamoTRIgine (LAMICTAL) 100 MG tablet Take 200 mg by mouth at bedtime.  Marland Kitchen losartan (COZAAR) 50 MG tablet Take 1 tablet by mouth once daily  . LYRICA 150 MG capsule Take 1 tablet by mouth 3 (three) times daily.  . Magnesium-Potassium 40-40 MG CAPS Take 1 capsule by mouth daily.  . meloxicam (MOBIC) 15 MG tablet Take 15 mg by mouth daily.  Marland Kitchen  metoprolol tartrate (LOPRESSOR) 50 MG tablet Take 1 tablet by mouth twice daily  . Naldemedine Tosylate (SYMPROIC) 0.2 MG TABS Take 1 tablet by mouth daily.  . nitroGLYCERIN (NITROSTAT) 0.4 MG SL tablet DISSOLVE ONE TABLET UNDER THE TONGUE EVERY 5 MINUTES AS NEEDED FOR CHEST PAIN.  DO NOT EXCEED A TOTAL OF 3 DOSES IN 15 MINUTES  . oxyCODONE-acetaminophen (PERCOCET) 10-325 MG tablet Take 1 tablet by mouth 3 (three) times daily.   . pravastatin (PRAVACHOL) 80 MG tablet Take 1 tablet (80 mg total)  by mouth daily.  . predniSONE (DELTASONE) 5 MG tablet Take 1 tablet by mouth once daily with breakfast  . pregabalin (LYRICA) 200 MG capsule Take 200 mg by mouth at bedtime.  . promethazine (PHENERGAN) 25 MG tablet 1 tab po q6h prn nausea  . rOPINIRole (REQUIP) 0.25 MG tablet TAKE 3 TABLETS BY MOUTH AT BEDTIME  . [DISCONTINUED] citalopram (CELEXA) 40 MG tablet Take 20 mg by mouth at bedtime.   . [DISCONTINUED] omeprazole (PRILOSEC) 20 MG capsule Take 40 mg by mouth daily.  . [DISCONTINUED] pantoprazole (PROTONIX) 40 MG tablet Take 1 tablet (40 mg total) by mouth 2 (two) times daily.  . [DISCONTINUED] pravastatin (PRAVACHOL) 40 MG tablet Take by mouth.   No facility-administered encounter medications on file as of 12/31/2018.    Physical Exam: Blood pressure 128/70, pulse 81, temperature 98.3 F (36.8 C), temperature source Oral, height 5\' 1"  (1.549 m), weight 269 lb 3.2 oz (122.1 kg), SpO2 95 %. Gen:      No acute distress HEENT:  EOMI, sclera anicteric Neck:     No masses; no thyromegaly Lungs:    End expiratory wheeze.  Mainly appreciated in the upper airway and neck.  No crackles or rales CV:         Regular rate and rhythm; no murmurs Abd:      + bowel sounds; soft, non-tender; no palpable masses, no distension Ext:    No edema; adequate peripheral perfusion Skin:      Warm and dry; no rash Neuro: alert and oriented x 3 Psych: Flat affect  Data Reviewed: Imaging: CT high-resolution 07/06/2016-no interstitial lung disease.  Mild trapping.  Coronary atherosclerosis  Chest x-ray 05/01/2018- no active cardiopulmonary disease. I have reviewed the images personally.  PFTs: 05/18/2016 FVC 1.65 [53%], FEV1 1.47 [59%], F/F 89, TLC 82%, DLCO 63%, DLCO/VA 96% No airway obstruction.  Spirometry worsening with inhaled bronchodilator.  Moderate reduction in diffusion capacity that corrects for volume.  12/31/2018 FVC 1.52 [44%], FEV1 1.35 [50%], F/F 89, TLC 3.7 [94%], DLCO 15.26 [75%] Minimal  restriction and diffusion defect.  ACQ 7 09/11/2018- 3.43  mMRC score 01/03/2019-3  Labs: CBC 01/30/2018-WBC 11.9, hemoglobin 14.5, platelets 368, eos 2.3%, absolute eosinophil count 274. CBC 09/24/2018-WBC 9.1, eos 1.7%, absolute eosinophil 155  10/08/2016-ANA, CCP, rheumatoid factor- Negative  IgE 09/24/2018-11  Assessment:  Follow-up for dyspnea She has a diagnosis of COPD, asthma however PFTs do not show airway obstruction by spirometry. Normal peripheral eosinophils and IgE.  Has wheeze on examination but given upper airway predominance of wheeze and significant psychiatric history there may be a component of vocal cord dysfunction and/or upper airway inflammation from LPR, GERD.  She has been referred to ENT.  Awaiting their evaluation as she also has symptoms of intermittent hoarseness  She has not tolerated multiple inhalers as noted above.  Continues on chronic prednisone at 5 mg. We will try Pulmicort, Perforomist nebulizer.  Continue albuterol as needed If symptoms continue  then consider referral to speech therapy and/or CT chest  GERD Continue PPI  Nocturnal hypoxemia Continue supplemental oxygen at night.  Discussed sleep study but she wants to hold off for now.  Active smoker Encourage smoking cessation but she is unable to quit due to stress.  She had tried nicotine patches, lozenges and gum in the past.  She was prescribed Chantix but could not afford due to cost.  Health maintenance Refused flu vaccination  Plan/Recommendations: - Start Performist, Pulmicort.  Continue albuterol as needed - Continue prednisone for now - ENT evaluation - Protonix  Marshell Garfinkel MD [facility] 12/31/2018, 2:35 PM  CC: McGowen, Adrian Blackwater, MD

## 2018-12-31 NOTE — Patient Instructions (Addendum)
We will start you on nebulizers: Perforomist and Pulmicort twice daily Continue the albuterol nebulizers as needed  We will call the ENT and make sure that they have follow-up Continue to work on smoking cessation  Follow-up in 1 to 2 months.

## 2018-12-31 NOTE — Progress Notes (Signed)
Full PFT performed today. °

## 2018-12-31 NOTE — Telephone Encounter (Signed)
It is after 5:30 PM EDT and the office is closed. Will keep encounter in triage box to f/u on tomorrow.

## 2019-01-01 ENCOUNTER — Inpatient Hospital Stay (HOSPITAL_COMMUNITY): Payer: Commercial Managed Care - PPO | Attending: Hematology

## 2019-01-01 DIAGNOSIS — M898X9 Other specified disorders of bone, unspecified site: Secondary | ICD-10-CM | POA: Insufficient documentation

## 2019-01-01 DIAGNOSIS — M899 Disorder of bone, unspecified: Secondary | ICD-10-CM

## 2019-01-01 LAB — BASIC METABOLIC PANEL
Anion gap: 11 (ref 5–15)
BUN: 12 mg/dL (ref 6–20)
CO2: 26 mmol/L (ref 22–32)
Calcium: 8.9 mg/dL (ref 8.9–10.3)
Chloride: 101 mmol/L (ref 98–111)
Creatinine, Ser: 0.58 mg/dL (ref 0.44–1.00)
GFR calc Af Amer: >60 mL/min (ref >60)
GFR calc non Af Amer: >60 mL/min (ref >60)
Glucose, Bld: 120 mg/dL — ABNORMAL HIGH (ref 70–99)
Potassium: 4.4 mmol/L (ref 3.5–5.1)
Sodium: 138 mmol/L (ref 135–145)

## 2019-01-01 NOTE — Telephone Encounter (Signed)
LMTCB x1 for pt.  

## 2019-01-02 NOTE — Telephone Encounter (Signed)
Spoke with pt, she states both medication Pulmicort and perforomist are 400 dollars and she cannot afford that. She states her albuterol is only 3 dollars and that's the only thing she can afford at this time. Please advise if you want to call another medication in.

## 2019-01-05 NOTE — Telephone Encounter (Signed)
Dr. Vaughan Browner is back in the office 01/06/2019.

## 2019-01-06 ENCOUNTER — Other Ambulatory Visit: Payer: Self-pay

## 2019-01-06 ENCOUNTER — Inpatient Hospital Stay (HOSPITAL_BASED_OUTPATIENT_CLINIC_OR_DEPARTMENT_OTHER): Payer: Commercial Managed Care - PPO | Admitting: Hematology

## 2019-01-06 ENCOUNTER — Encounter: Payer: Self-pay | Admitting: Family Medicine

## 2019-01-06 DIAGNOSIS — M898X0 Other specified disorders of bone, multiple sites: Secondary | ICD-10-CM

## 2019-01-06 DIAGNOSIS — M899 Disorder of bone, unspecified: Secondary | ICD-10-CM

## 2019-01-06 NOTE — Telephone Encounter (Signed)
There are no other good options as she has tried and failed most all inhalers Refer to pharmacy for medication review and see if we can get her on any assistance Continue albuterol for now

## 2019-01-06 NOTE — Progress Notes (Signed)
Virtual Visit via Telephone Note  I connected with Martha Espinoza on 01/06/19 at  2:50 PM EDT by telephone and verified that I am speaking with the correct person using two identifiers.   I discussed the limitations, risks, security and privacy concerns of performing an evaluation and management service by telephone and the availability of in person appointments. I also discussed with the patient that there may be a patient responsible charge related to this service. The patient expressed understanding and agreed to proceed.   History of Present Illness: #1 sclerotic bone lesions: -HRCT of the chest on 09/12/2018 showed innumerable tiny sclerotic lesions throughout the thoracic spine, largest in T9 and L1 vertebral bodies. - These lesions were also seen on the CT scan of the abdomen and pelvis from 2017" T9 and L1 region but slightly smaller. -Bone scan on 10/06/2018 did not show any evidence of metastatic disease.  Multiple myeloma work-up was also negative.  CEA was within normal limits.   Observations/Objective: She denies any new onset bone pains.  Denies any fevers, night sweats or weight loss numbness in the extremities has been stable.  Chronic headaches are also stable.  Appetite and energy levels are 25%.  She has shortness of breath on exertion from her COPD.  Occasional nausea and constipation are also stable.  No recent infections or hospitalizations.  Assessment and Plan: 1 sclerotic bone lesions: - I reviewed labs from 01/01/2019 which showed normal calcium and creatinine. - I have recommended follow-up in 4 months with repeat myeloma labs. -We plan to repeat CT of the spine in June 2021 if there is no major changes in her myeloma panel.  2.  Smoking history: - CT of the chest on 09/12/2018 did not show any lung lesions. -He smoked 1 pack/day for 25 years, quit 5 years and currently smoking about half pack per day.  She started smoking at age 53. -She will need another CT of the  chest lung cancer screening protocol in June 2021.  Follow Up Instructions: RTC 4 months with labs.   I discussed the assessment and treatment plan with the patient. The patient was provided an opportunity to ask questions and all were answered. The patient agreed with the plan and demonstrated an understanding of the instructions.   The patient was advised to call back or seek an in-person evaluation if the symptoms worsen or if the condition fails to improve as anticipated.  I provided 13 minutes of non-face-to-face time during this encounter.   Derek Jack, MD

## 2019-01-06 NOTE — Telephone Encounter (Signed)
Spoke with patient. Let her know the consult with pharmacy team was put in they would be contacting her.  Let her know to continue albuterol.  Patient voiced understanding Nothing further needled at this time.

## 2019-01-09 NOTE — Telephone Encounter (Signed)
Called patient to schedule appointment with the pharmacy team. Patient advised that someone call yesterday and she said is currently swamped with MD appointments and she is unable to schedule at this time. Will follow up in 1 month. Patient has a surgery scheduled for next month.  9:16 AM Beatriz Chancellor, CPhT

## 2019-01-12 ENCOUNTER — Encounter: Payer: Self-pay | Admitting: Family Medicine

## 2019-01-19 ENCOUNTER — Telehealth: Payer: Self-pay | Admitting: Pulmonary Disease

## 2019-01-19 NOTE — Telephone Encounter (Signed)
Spoke with pt. She had some questions about Pulmicort and Perforomist. I have answered all of her questions to the best of my ability. Nothing further was needed.

## 2019-01-26 ENCOUNTER — Ambulatory Visit (INDEPENDENT_AMBULATORY_CARE_PROVIDER_SITE_OTHER): Payer: Commercial Managed Care - PPO | Admitting: Family Medicine

## 2019-01-26 ENCOUNTER — Other Ambulatory Visit: Payer: Self-pay

## 2019-01-26 ENCOUNTER — Encounter: Payer: Self-pay | Admitting: Family Medicine

## 2019-01-26 VITALS — BP 112/73 | HR 81

## 2019-01-26 DIAGNOSIS — R0789 Other chest pain: Secondary | ICD-10-CM | POA: Diagnosis not present

## 2019-01-26 DIAGNOSIS — R490 Dysphonia: Secondary | ICD-10-CM | POA: Diagnosis not present

## 2019-01-26 DIAGNOSIS — I10 Essential (primary) hypertension: Secondary | ICD-10-CM | POA: Diagnosis not present

## 2019-01-26 DIAGNOSIS — E7800 Pure hypercholesterolemia, unspecified: Secondary | ICD-10-CM | POA: Diagnosis not present

## 2019-01-26 DIAGNOSIS — R062 Wheezing: Secondary | ICD-10-CM | POA: Diagnosis not present

## 2019-01-26 DIAGNOSIS — F411 Generalized anxiety disorder: Secondary | ICD-10-CM

## 2019-01-26 NOTE — Progress Notes (Signed)
OFFICE VISIT  01/26/2019   CC:  Chief Complaint  Patient presents with  . Follow-up    [facility]   I connected with pt on 01/26/19 at  1:30 PM EST by a video enabled telemedicine application and verified that I am speaking with the correct person using two identifiers.  Location patient: home Location provider:work or home office Persons participating in the virtual visit: patient, provider  I discussed the limitations of evaluation and management by telemedicine and the availability of in person appointments. The patient expressed understanding and agreed to proceed.  Telemedicine visit is a necessity given the COVID-19 restrictions in place at the current time.  HPI:    Patient is a 53 y.o. Caucasian female who is being seen today for chronic wheezing w/SOB (? Upper airway etiology? Has chronic hoarseness as well->see pmh below for details), obesity hypoventilation syndrome, HTN, HLD, and chronic anxiety d/o (on benzos).  Anxiety: PMP AWARE reviewed today.  Most recent fill of clonazepam was 12/27/18, #90, rx by me. No red flags.  Taking clonaz 1 qAM and 2 qPM as per her usual and anxiety is stable. Has significant depression lately, [facility] was unable to set her up with a counselor so she has appt with [facility] in [place] to get established with both a psychiatrist and a Social worker. Best friend died a few days ago unexpectedly.  She is mourning approp.  Denies SI or HI.  HLD: I increased her pravastatin to 80mg  qd about 7 wks ago. She is tolerating inc dose fine.  HTN: no elevated bp's at home.  Rare low bp briefly.  Resp: still with intermittent wheezing and SOB, but seems to get some improvement in breathing after using the formoterol/budesenide mix in neb since last pulm f/u 12/31/18. Still taking prednisone 5 mg qd but this has not made any difference in her symptoms. Her eval at pulm has not shed any significant light on her pulm issues, except that Dr. Vaughan Browner does say he thinks her  symptoms are most suggestive of upper airway etiology.  Says she has been having substernal CP, not precipitated by exertion-->she is at rest each time. Not in situation particularly stressful.  Makes L jaw ache, feels some sweating, sometimes dizzy, +SOB. Lasts 5-10 min, seems to resolve after she chews an aspirin.  No nausea or left arm pain. On one occasion during the CP she checked her bp and says it was 80s/50s. She has not told any medical provider about her CP. Seems to have happened 4-5 times over the last month or so.   Past Medical History:  Diagnosis Date  . Abnormal CT of thoracic spine 09/12/2018   Noncontrast.  Increased in number and TINY increase in size of tiny, patchy sclerotic T spine lesions compared to 2018 CT.  Hem/onc summer 2020->w/u->suspected benign bone islands->survellance imaging in 6-12 mo planned.  . Arthritis   . Asthma   . Bipolar 1 disorder ([place])   . CAD (coronary artery disease) 08/2018   1 V CAD noted on noncontrast CT chest 08/2018  . Chronic pain syndrome    chronic low back pain (Pain mgmt= Dr. [name]).  MRI showed no signif disc dz, only showed some facet arthropathy at L4-5, with joint diastasis at L4-5--plan as of 11/07/16 neurosurg eval is bilat facet injections.  . Colitis due to Clostridium difficile 2001  . COPD (chronic obstructive pulmonary disease) ([place])    PFTs 04/2016 showed no sign of copd or asthma, plus she actually had  worse FEV1 after taking albuterol.  However, as of 06/2018 she has recent hx of improvement in sx's after taking albuterol.  LONG TIME SMOKER, won't quit.  Intolerant to or cannot afford: pulmicort, QVAR, advair, symbicort, flovent, bevespi, and spiriva, anoro,stiolto, and trelegy. Pulmicort trial per pulm 12/2018  . Depression   . GAD (generalized anxiety disorder)   . GERD (gastroesophageal reflux disease)   . Hepatic hemangioma   . Hepatic steatosis 06/2016   Noted incidentally on CT chest  . History of Salmonella  gastroenteritis   . Hyperlipidemia    a. Noted 02/2014.  Marland Kitchen Hypertension    HCTZ started 07/2015  . IBS (irritable bowel syndrome)   . Iron deficiency anemia 05/2014   per pt it is not due to vaginal blood loss; hemoccults sent to pt in mail 413/16.  . Lumbar spondylosis    mild, mainly focused at L4-5 and L5-S1.  MRI L spine 09/09/17-->mild degenerative disc disease and facet arthrosis without any nerve root encroachment, no spinal stenosis.  . Microscopic hematuria    Intermittent (no w/u done yet, as of 03/2014)  . Migraine headache   . Morbid obesity ([place])   . Multiple lipomas 09/2016   left upper arm--very small ones.  . Nocturnal hypoxia 09/18/2017   Overnight oximetry -->qualified: ordered 2L oxygen during sleep.  . Obesity hypoventilation syndrome ([place])   . Pain in both lower legs 2018-2019   pain and numbness bilat LLs.  NCS/EMGs findings suggestive of S1 radiculopathy bilat: sx's + NCS findings suggestive of neurogenic claudication/spinal stenosis.  ? contribution of venous reflux?  . Peripheral edema Fall 2018   R>L, non-pitting for the most part--venous doppler neg for DVT 01/2017.  Saw Dr. Bridgett Larsson (Vasc) 04/12/17 and u/s showed some venous reflux dz but he felt her sx's in legs were NOT due to her venous insufficiency OR PAD.  Thigh high comp stockings rx'd.  . Polycystic ovarian syndrome   . Pruritic dermatitis 2015/2016   +Scabies prep at [facility] 07/2014. (Primarily pruritic skin, but eventually a subtle rash as well)  . Raynaud's phenomenon 2019  . Rectal cancer ([place])    a. Followed by Dr. Gala Romney, dx 2000-2001.  Surveillance colonoscopy planned as of 11/2018 GI f/u.  Marland Kitchen Recurrent sinusitis    +allergic rhinitis  . Right shoulder pain 2017   Murphy/Wainer: RC bursitis + RC tear (MRI)--injection trial is plan per pt report 05/04/16.  . Tobacco dependence   . Vitamin D deficiency   . Wheezing    Most of her resp symptoms are c/w upper airway etiology (repeated PFTs ->no  obstructive dz). Pulm referred her to ENT 12/2018.    Past Surgical History:  Procedure Laterality Date  . ABI's complete Bilateral 09/30/2017   Normal ABIs.  Great toe pressures adequate for wound healing.  Femoral artery waveforms triphasic (normal).  . CARDIOVASCULAR STRESS TEST  03/24/14   Lexscan MIBI: mild anterior ischemia?  Cardiac CT angiogram recommended/done.  . CHOLECYSTECTOMY    . COLONOSCOPY  05/05/2007   adenoma  . coronary CT angio  03/29/14   Two vessel dz/moderate stenosis of mid LAD and proximal RCA; cath recommended.  Marland Kitchen DILATION AND CURETTAGE OF UTERUS     for vaginal bleeding  . JOINT REPLACEMENT    . LEFT HEART CATHETERIZATION WITH CORONARY ANGIOGRAM N/A 03/31/2014   Normal coronaries, EF 0000000, diastolic dysfunction.  Procedure: LEFT HEART CATHETERIZATION WITH CORONARY ANGIOGRAM;  Surgeon: Sinclair Grooms, MD;  Location: [facility] CATH  LAB;  Service: Cardiovascular;  Laterality: N/A;  . LOWER EXT VENOUS DOPPLERS Bilateral 02/08/2017   NEG for DVT (bilat)  . NCS/EMG  10/03/2017   S1 radiculopathy bilat  . Overnight oximetry testing  09/2017   qualified for oxygen supplementation during sleep  . PFTs  04/2016   No sign of COPD or asthma; FEV 1 worse after albut.  . rectal mss  age 62   High-grade rectal adenoma removed from rectum   . TRANSTHORACIC ECHOCARDIOGRAM  03/23/14; 10/21/17   2015 Normal.  2019: mild LVH, EF 65-70%, study technically inadequate to assess diastolic function, valves were fine, wall motion normal.    Outpatient Medications Prior to Visit  Medication Sig Dispense Refill  . albuterol (PROVENTIL) (2.5 MG/3ML) 0.083% nebulizer solution Take 3 mLs (2.5 mg total) by nebulization every 6 (six) hours as needed for wheezing or shortness of breath. 75 mL 6  . albuterol (VENTOLIN HFA) 108 (90 Base) MCG/ACT inhaler INHALE 1 TO 2 PUFFS BY MOUTH EVERY 4 HOURS AS NEEDED 16 g 11  . AMITIZA 24 MCG capsule Take 24 mcg by mouth 2 (two) times daily.  1  . aspirin 81  MG chewable tablet Chew 1 tablet (81 mg total) by mouth daily. 14 tablet 0  . azelastine (ASTELIN) 0.1 % nasal spray Place 2 sprays into both nostrils 2 (two) times daily. 30 mL 6  . budesonide (PULMICORT) 0.5 MG/2ML nebulizer solution Take 2 mLs by nebulization 2 (two) times daily.    . busPIRone (BUSPAR) 15 MG tablet 15 mg. 1 in the morning, 1 mid day, and 2 at bedtime    . clonazePAM (KLONOPIN) 0.5 MG tablet TAKE 1 TABLET BY MOUTH DURING THE DAY AS NEEDED AND 2 TABLETS AT BEDTIME 90 tablet 5  . cyclobenzaprine (FLEXERIL) 10 MG tablet TAKE ONE TABLET BY MOUTH EVERY 6 HOURS AS NEEDED (SPASM RELATED TO HEADACHES) 42 tablet 5  . diphenhydrAMINE (BENADRYL) 25 MG tablet Take 1 tablet (25 mg total) by mouth every 6 (six) hours. 20 tablet 0  . EQ ALLERGY RELIEF, CETIRIZINE, 10 MG tablet Take 1 tablet by mouth once daily 90 tablet 1  . fluticasone (FLONASE) 50 MCG/ACT nasal spray Place 2 sprays into both nostrils daily. 16 g 1  . formoterol (PERFOROMIST) 20 MCG/2ML nebulizer solution Take 2 mLs (20 mcg total) by nebulization 2 (two) times daily. 120 mL 11  . hydrOXYzine (ATARAX/VISTARIL) 25 MG tablet Take 25 mg by mouth 3 (three) times daily.     Marland Kitchen IRON-FOLIC ACID PO Take 65 mg by mouth daily.     Marland Kitchen lamoTRIgine (LAMICTAL) 100 MG tablet Take 200 mg by mouth at bedtime.    Marland Kitchen losartan (COZAAR) 50 MG tablet Take 1 tablet by mouth once daily 90 tablet 1  . LYRICA 150 MG capsule Take 1 tablet by mouth 3 (three) times daily.  2  . Magnesium-Potassium 40-40 MG CAPS Take 1 capsule by mouth daily.    . meloxicam (MOBIC) 15 MG tablet Take 15 mg by mouth daily.  1  . metoprolol tartrate (LOPRESSOR) 50 MG tablet Take 1 tablet by mouth twice daily 180 tablet 1  . Naldemedine Tosylate (SYMPROIC) 0.2 MG TABS Take 1 tablet by mouth daily.    Marland Kitchen oxyCODONE-acetaminophen (PERCOCET) 10-325 MG tablet Take 1 tablet by mouth 3 (three) times daily.     . pravastatin (PRAVACHOL) 80 MG tablet Take 1 tablet (80 mg total) by mouth  daily. 30 tablet 6  . predniSONE (DELTASONE) 5  MG tablet Take 1 tablet by mouth once daily with breakfast 30 tablet 3  . promethazine (PHENERGAN) 25 MG tablet 1 tab po q6h prn nausea 30 tablet 3  . rOPINIRole (REQUIP) 0.25 MG tablet TAKE 3 TABLETS BY MOUTH AT BEDTIME 270 tablet 0  . nitroGLYCERIN (NITROSTAT) 0.4 MG SL tablet DISSOLVE ONE TABLET UNDER THE TONGUE EVERY 5 MINUTES AS NEEDED FOR CHEST PAIN.  DO NOT EXCEED A TOTAL OF 3 DOSES IN 15 MINUTES (Patient not taking: Reported on 01/06/2019) 10 tablet 0   No facility-administered medications prior to visit.     Allergies  Allergen Reactions  . Contrast Media [Iodinated Diagnostic Agents] Anaphylaxis    Needs 13-hour prep  . Shellfish Allergy Anaphylaxis  . Codeine Nausea And Vomiting and Other (See Comments)    Hallucinations, Also sees people that are not there   . Furosemide Rash    Rash, soB.  Marland Kitchen Anoro Ellipta [Umeclidinium-Vilanterol] Other (See Comments)    Pt states it caused blisters.  . Flovent Diskus [Fluticasone Propionate (Inhal)]     headache  . Ivermectin Nausea And Vomiting    N/V and rash  . Spiriva Handihaler [Tiotropium Bromide Monohydrate] Other (See Comments)    Severe headaches  . Symbicort [Budesonide-Formoterol Fumarate]     headache  . Betadine [Povidone Iodine] Rash  . Latex Hives  . Lipitor [Atorvastatin] Itching  . Permethrin Itching  . Stiolto Respimat [Tiotropium Bromide-Olodaterol] Other (See Comments)    Mouth ulcers  . Tape Dermatitis    Paper tape and clear  . Trelegy Ellipta [Fluticasone-Umeclidin-Vilant] Other (See Comments)    headaches    ROS As per HPI  PE: Blood pressure 112/73, pulse 81, SpO2 93 %. GENERAL: alert, oriented, appears well and in no acute distress  HEENT: atraumatic, conjunttiva clear, no obvious abnormalities on inspection of external nose and ears  NECK: normal movements of the head and neck  LUNGS: on inspection no signs of respiratory distress, breathing  rate appears normal, no obvious gross SOB, gasping or wheezing  CV: no obvious cyanosis  MS: moves all visible extremities without noticeable abnormality  PSYCH/NEURO: pleasant and cooperative, no obvious depression or anxiety, speech and thought processing grossly intact   LABS:  Lab Results  Component Value Date   TSH 1.12 10/08/2016   Lab Results  Component Value Date   WBC 9.8 09/25/2018   HGB 14.3 09/25/2018   HCT 45.5 09/25/2018   MCV 91.5 09/25/2018   PLT 265 09/25/2018   Lab Results  Component Value Date   CREATININE 0.58 01/01/2019   BUN 12 01/01/2019   NA 138 01/01/2019   K 4.4 01/01/2019   CL 101 01/01/2019   CO2 26 01/01/2019   Lab Results  Component Value Date   ALT 9 12/04/2018   AST 8 12/04/2018   ALKPHOS 93 12/04/2018   BILITOT 0.4 12/04/2018   Lab Results  Component Value Date   CHOL 247 (H) 12/04/2018   Lab Results  Component Value Date   HDL 38.50 (L) 12/04/2018   Lab Results  Component Value Date   LDLCALC 169 (H) 03/24/2014   Lab Results  Component Value Date   TRIG 324.0 (H) 12/04/2018   Lab Results  Component Value Date   CHOLHDL 6 12/04/2018   Lab Results  Component Value Date   HGBA1C 5.8 09/24/2018    IMPRESSION AND PLAN:  1) Atypical chest pain: has multiple cardiac RF's. Hx of normal cath 2016.  Will ask cardiology to  see her and further evaluate. Signs/symptoms to call or return for were reviewed and pt expressed understanding.  2) GAD: doing fine considering all that she has been going through with covid 76, medical issues, financial issues, death of a friend last week.  No changes in med (I only rx the clonazepam she is on, no new rx for this today).  She has appt to establish with [facility] soon (transferring from [facility]).  3) HTN: The current medical regimen is effective;  continue present plan and medications. Lytes/cr good 3 wks ago.  4) HLD: tolerating increased dose of pravastatin (80mg ). Due for repeat FLP  at the end of the week->lab is scheduled.  5) Wheezing->it has long been a question of whether this has been from asthma/COPD vs upper airway or BOTH.  She'll see ENT MD soon ([facility]) and she will continue to f/u with Dr. Vaughan Browner in pulm and she'll continue formoterol+budesenide nebs., albut q6h prn. I recommended she stop the daily low dose prednisone I started her on a while back--not helping (risk>reward). She take 5mg  qod x 1 mo and then d/c completely.   An After Visit Summary was printed and given to the patient.  FOLLOW UP: No follow-ups on file.  Signed:  Crissie Sickles, MD           01/26/2019

## 2019-01-29 ENCOUNTER — Other Ambulatory Visit: Payer: Self-pay | Admitting: Family Medicine

## 2019-01-29 DIAGNOSIS — E7800 Pure hypercholesterolemia, unspecified: Secondary | ICD-10-CM

## 2019-01-30 ENCOUNTER — Ambulatory Visit: Payer: Commercial Managed Care - PPO

## 2019-01-30 ENCOUNTER — Other Ambulatory Visit: Payer: Self-pay

## 2019-01-30 ENCOUNTER — Encounter: Payer: Self-pay | Admitting: Psychiatry

## 2019-01-30 ENCOUNTER — Ambulatory Visit (INDEPENDENT_AMBULATORY_CARE_PROVIDER_SITE_OTHER): Payer: Commercial Managed Care - PPO | Admitting: Psychiatry

## 2019-01-30 DIAGNOSIS — F4310 Post-traumatic stress disorder, unspecified: Secondary | ICD-10-CM | POA: Diagnosis not present

## 2019-01-30 DIAGNOSIS — F418 Other specified anxiety disorders: Secondary | ICD-10-CM

## 2019-01-30 MED ORDER — SERTRALINE HCL 50 MG PO TABS: 50.0000 mg | ORAL_TABLET | Freq: Every day | ORAL | 0 refills | Status: DC

## 2019-01-30 MED ORDER — TRAZODONE HCL 150 MG PO TABS: 150.0000 mg | ORAL_TABLET | Freq: Every day | ORAL | 0 refills | Status: DC
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
## 2019-02-02 ENCOUNTER — Telehealth: Payer: Self-pay | Admitting: Internal Medicine

## 2019-02-02 NOTE — Telephone Encounter (Signed)
Called pt. She is wanting to reschedule her procedure scheduled for Thursday d/t going to start her menstrual cycle. I advised patient it was very important she keep this appt given her diagnosis and history. She did not want to keep it and requested to r/s. I advised patient next available was not until 2/4. She states to schedule her for this date. I again advised patient importance of keeping appt scheduled for Thursday but she still requested to r/s. Endo aware. New instructions/appts mailed.

## 2019-02-02 NOTE — Telephone Encounter (Signed)
Pt is wanting to reschedule her colonoscopy with [facility] for 02/05/2019. She is afraid she will start her period and would rather reschedule. [phone]

## 2019-02-03 ENCOUNTER — Other Ambulatory Visit (HOSPITAL_COMMUNITY): Payer: Commercial Managed Care - PPO

## 2019-02-03 ENCOUNTER — Encounter (HOSPITAL_COMMUNITY): Admission: RE | Admit: 2019-02-03 | Payer: Commercial Managed Care - PPO | Source: Ambulatory Visit

## 2019-02-05 ENCOUNTER — Telehealth: Payer: Self-pay | Admitting: Family Medicine

## 2019-02-05 NOTE — Telephone Encounter (Signed)
Pt scheduled for tomorrow, will getting fasting labs as well.

## 2019-02-05 NOTE — Telephone Encounter (Signed)
Patient reports she is experiencing left ear pain, and the entire side of her head hurts.  She requested for Dr. Anitra Lauth to call something in for her. I told her patient she would need to be evaluated. She says Dr. Anitra Lauth does telephone visits with her.  In review of the schedule this afternoon, is there any availability for Dr. Anitra Lauth to do a telephone visit?  Please contact patient at [phone].

## 2019-02-06 ENCOUNTER — Other Ambulatory Visit: Payer: Self-pay

## 2019-02-06 ENCOUNTER — Ambulatory Visit (INDEPENDENT_AMBULATORY_CARE_PROVIDER_SITE_OTHER): Payer: Commercial Managed Care - PPO | Admitting: Family Medicine

## 2019-02-06 ENCOUNTER — Encounter: Payer: Self-pay | Admitting: Family Medicine

## 2019-02-06 VITALS — BP 113/77 | HR 95 | Temp 99.1°F | Resp 16 | Ht 63.0 in | Wt 266.8 lb

## 2019-02-06 DIAGNOSIS — E7800 Pure hypercholesterolemia, unspecified: Secondary | ICD-10-CM

## 2019-02-06 DIAGNOSIS — I889 Nonspecific lymphadenitis, unspecified: Secondary | ICD-10-CM

## 2019-02-06 MED ORDER — AMOXICILLIN-POT CLAVULANATE 875-125 MG PO TABS: 1.0000 | ORAL_TABLET | Freq: Two times a day (BID) | ORAL | 0 refills | Status: DC

## 2019-02-06 NOTE — Addendum Note (Signed)
Addended by: Ralph Dowdy on: 02/06/2019 02:20 PM   Modules accepted: Orders

## 2019-02-06 NOTE — Progress Notes (Signed)
OFFICE VISIT  02/06/2019   CC:  Chief Complaint  Patient presents with  . Ear Pain    left  . Headache    left   HPI:    Patient is a 53 y.o. Caucasian female who presents for pain in left ear and left sided headache. Onset 1 wk ago, had noted chunks of wax coming out of ear and it has felt stopped up and then started getting bad pain in ear and area around it.  Yesterday evening she started getting some L nasal congestion and some PND with irritated throat.  No fever.      Past Medical History:  Diagnosis Date  . Abnormal CT of thoracic spine 09/12/2018   Noncontrast.  Increased in number and TINY increase in size of tiny, patchy sclerotic T spine lesions compared to 2018 CT.  Hem/onc summer 2020->w/u->suspected benign bone islands->survellance imaging in 6-12 mo planned.  . Arthritis   . Asthma   . Bipolar 1 disorder ([place])   . CAD (coronary artery disease) 08/2018   1 V CAD noted on noncontrast CT chest 08/2018  . Chronic pain syndrome    chronic low back pain (Pain mgmt= Dr. [name]).  MRI showed no signif disc dz, only showed some facet arthropathy at L4-5, with joint diastasis at L4-5--plan as of 11/07/16 neurosurg eval is bilat facet injections.  . Colitis due to Clostridium difficile 2001  . COPD (chronic obstructive pulmonary disease) ([place])    PFTs 04/2016 showed no sign of copd or asthma, plus she actually had worse FEV1 after taking albuterol.  However, as of 06/2018 she has recent hx of improvement in sx's after taking albuterol.  LONG TIME SMOKER, won't quit.  Intolerant to or cannot afford: pulmicort, QVAR, advair, symbicort, flovent, bevespi, and spiriva, anoro,stiolto, and trelegy. Pulmicort trial per pulm 12/2018  . Depression   . GAD (generalized anxiety disorder)   . GERD (gastroesophageal reflux disease)   . Hepatic hemangioma   . Hepatic steatosis 06/2016   Noted incidentally on CT chest  . History of Salmonella gastroenteritis   . Hyperlipidemia    a. Noted  02/2014.  Marland Kitchen Hypertension    HCTZ started 07/2015  . IBS (irritable bowel syndrome)   . Iron deficiency anemia 05/2014   per pt it is not due to vaginal blood loss; hemoccults sent to pt in mail 413/16.  . Lumbar spondylosis    mild, mainly focused at L4-5 and L5-S1.  MRI L spine 09/09/17-->mild degenerative disc disease and facet arthrosis without any nerve root encroachment, no spinal stenosis.  . Microscopic hematuria    Intermittent (no w/u done yet, as of 03/2014)  . Migraine headache   . Morbid obesity ([place])   . Multiple lipomas 09/2016   left upper arm--very small ones.  . Nocturnal hypoxia 09/18/2017   Overnight oximetry -->qualified: ordered 2L oxygen during sleep.  . Obesity hypoventilation syndrome ([place])   . Pain in both lower legs 2018-2019   pain and numbness bilat LLs.  NCS/EMGs findings suggestive of S1 radiculopathy bilat: sx's + NCS findings suggestive of neurogenic claudication/spinal stenosis.  ? contribution of venous reflux?  . Peripheral edema Fall 2018   R>L, non-pitting for the most part--venous doppler neg for DVT 01/2017.  Saw Dr. Bridgett Larsson (Vasc) 04/12/17 and u/s showed some venous reflux dz but he felt her sx's in legs were NOT due to her venous insufficiency OR PAD.  Thigh high comp stockings rx'd.  . Polycystic ovarian syndrome   .  Pruritic dermatitis 2015/2016   +Scabies prep at [facility] 07/2014. (Primarily pruritic skin, but eventually a subtle rash as well)  . PTSD (post-traumatic stress disorder)   . Raynaud's phenomenon 2019  . Rectal cancer ([place])    a. Followed by Dr. Gala Romney, dx 2000-2001.  Surveillance colonoscopy planned as of 11/2018 GI f/u.  Marland Kitchen Recurrent sinusitis    +allergic rhinitis  . Right shoulder pain 2017   Murphy/Wainer: RC bursitis + RC tear (MRI)--injection trial is plan per pt report 05/04/16.  . Tobacco dependence   . Vitamin D deficiency   . Wheezing    Most of her resp symptoms are c/w upper airway etiology (repeated PFTs ->no  obstructive dz). Pulm referred her to ENT 12/2018.    Past Surgical History:  Procedure Laterality Date  . ABI's complete Bilateral 09/30/2017   Normal ABIs.  Great toe pressures adequate for wound healing.  Femoral artery waveforms triphasic (normal).  . CARDIOVASCULAR STRESS TEST  03/24/14   Lexscan MIBI: mild anterior ischemia?  Cardiac CT angiogram recommended/done.  . CHOLECYSTECTOMY    . COLONOSCOPY  05/05/2007   adenoma  . coronary CT angio  03/29/14   Two vessel dz/moderate stenosis of mid LAD and proximal RCA; cath recommended.  Marland Kitchen DILATION AND CURETTAGE OF UTERUS     for vaginal bleeding  . JOINT REPLACEMENT    . LEFT HEART CATHETERIZATION WITH CORONARY ANGIOGRAM N/A 03/31/2014   Normal coronaries, EF 0000000, diastolic dysfunction.  Procedure: LEFT HEART CATHETERIZATION WITH CORONARY ANGIOGRAM;  Surgeon: Sinclair Grooms, MD;  Location: [facility] CATH LAB;  Service: Cardiovascular;  Laterality: N/A;  . LOWER EXT VENOUS DOPPLERS Bilateral 02/08/2017   NEG for DVT (bilat)  . NCS/EMG  10/03/2017   S1 radiculopathy bilat  . Overnight oximetry testing  09/2017   qualified for oxygen supplementation during sleep  . PFTs  04/2016   No sign of COPD or asthma; FEV 1 worse after albut.  . rectal mss  age 85   High-grade rectal adenoma removed from rectum   . TRANSTHORACIC ECHOCARDIOGRAM  03/23/14; 10/21/17   2015 Normal.  2019: mild LVH, EF 65-70%, study technically inadequate to assess diastolic function, valves were fine, wall motion normal.    Outpatient Medications Prior to Visit  Medication Sig Dispense Refill  . albuterol (PROVENTIL) (2.5 MG/3ML) 0.083% nebulizer solution Take 3 mLs (2.5 mg total) by nebulization every 6 (six) hours as needed for wheezing or shortness of breath. 75 mL 6  . albuterol (VENTOLIN HFA) 108 (90 Base) MCG/ACT inhaler INHALE 1 TO 2 PUFFS BY MOUTH EVERY 4 HOURS AS NEEDED (Patient taking differently: Inhale 1-2 puffs into the lungs 4 (four) times daily. ) 16 g 11   . AMITIZA 24 MCG capsule Take 24 mcg by mouth 2 (two) times daily.  1  . aspirin 81 MG chewable tablet Chew 1 tablet (81 mg total) by mouth daily. (Patient taking differently: Chew 81 mg by mouth every evening. ) 14 tablet 0  . azelastine (ASTELIN) 0.1 % nasal spray Place 2 sprays into both nostrils 2 (two) times daily. 30 mL 6  . budesonide (PULMICORT) 0.5 MG/2ML nebulizer solution Take 2 mLs by nebulization 2 (two) times daily.    . busPIRone (BUSPAR) 15 MG tablet Take 30 mg by mouth See admin instructions. TAKE 2 TABLETS (30 MG) BY MOUTH AT BEDTIME.    . clonazePAM (KLONOPIN) 0.5 MG tablet TAKE 1 TABLET BY MOUTH DURING THE DAY AS NEEDED AND 2 TABLETS  AT BEDTIME (Patient taking differently: Take 0.5-1 mg by mouth See admin instructions. TAKE 1 TABLET (0.5 MG) BY MOUTH DAILY IN THE MORNING & TAKE 2 TABLETS (1 MG) BY MOUTH AT BEDTIME.) 90 tablet 5  . cyclobenzaprine (FLEXERIL) 10 MG tablet TAKE ONE TABLET BY MOUTH EVERY 6 HOURS AS NEEDED (SPASM RELATED TO HEADACHES) (Patient taking differently: Take 10 mg by mouth 3 (three) times daily. ) 42 tablet 5  . diphenhydrAMINE (BENADRYL) 25 MG tablet Take 1 tablet (25 mg total) by mouth every 6 (six) hours. (Patient taking differently: Take 25 mg by mouth every 6 (six) hours as needed (allergic reactions). ) 20 tablet 0  . EQ ALLERGY RELIEF, CETIRIZINE, 10 MG tablet Take 1 tablet by mouth once daily (Patient taking differently: Take 10 mg by mouth at bedtime. ) 90 tablet 1  . ferrous sulfate 325 (65 FE) MG EC tablet Take 325 mg by mouth every evening.    . fluticasone (FLONASE) 50 MCG/ACT nasal spray Place 2 sprays into both nostrils daily as needed for allergies.    . formoterol (PERFOROMIST) 20 MCG/2ML nebulizer solution Take 2 mLs (20 mcg total) by nebulization 2 (two) times daily. 120 mL 11  . lamoTRIgine (LAMICTAL) 100 MG tablet Take 200 mg by mouth at bedtime.    Marland Kitchen losartan (COZAAR) 50 MG tablet Take 1 tablet by mouth once daily (Patient taking  differently: Take 50 mg by mouth every evening. ) 90 tablet 1  . meloxicam (MOBIC) 15 MG tablet Take 15 mg by mouth every evening.   1  . metoprolol tartrate (LOPRESSOR) 50 MG tablet Take 1 tablet by mouth twice daily (Patient taking differently: Take 50 mg by mouth 2 (two) times daily. ) 180 tablet 1  . Naldemedine Tosylate (SYMPROIC) 0.2 MG TABS Take 0.2 mg by mouth at bedtime.     . nitroGLYCERIN (NITROSTAT) 0.4 MG SL tablet DISSOLVE ONE TABLET UNDER THE TONGUE EVERY 5 MINUTES AS NEEDED FOR CHEST PAIN.  DO NOT EXCEED A TOTAL OF 3 DOSES IN 15 MINUTES (Patient taking differently: Place 0.4 mg under the tongue every 5 (five) minutes x 3 doses as needed for chest pain. ) 10 tablet 0  . oxyCODONE-acetaminophen (PERCOCET) 10-325 MG tablet Take 1 tablet by mouth 4 (four) times daily.     . pantoprazole (PROTONIX) 40 MG tablet     . Potassium (POTASSIMIN PO) Take 2 tablets by mouth at bedtime.    . pravastatin (PRAVACHOL) 80 MG tablet Take 1 tablet (80 mg total) by mouth daily. (Patient taking differently: Take 80 mg by mouth every evening. ) 30 tablet 6  . pregabalin (LYRICA) 200 MG capsule     . promethazine (PHENERGAN) 25 MG tablet 1 tab po q6h prn nausea (Patient taking differently: Take 25 mg by mouth every 6 (six) hours as needed (nausea). ) 30 tablet 3  . rOPINIRole (REQUIP) 0.25 MG tablet TAKE 3 TABLETS BY MOUTH AT BEDTIME (Patient taking differently: Take 0.75 mg by mouth at bedtime. ) 270 tablet 0  . sertraline (ZOLOFT) 50 MG tablet Take 1 tablet (50 mg total) by mouth daily. 90 tablet 0  . traZODone (DESYREL) 150 MG tablet Take 1 tablet (150 mg total) by mouth at bedtime. 90 tablet 0   No facility-administered medications prior to visit.     Allergies  Allergen Reactions  . Contrast Media [Iodinated Diagnostic Agents] Anaphylaxis    Needs 13-hour prep  . Shellfish Allergy Anaphylaxis  . Codeine Nausea And Vomiting and  Other (See Comments)    Hallucinations, Also sees people that are not  there   . Furosemide Rash    Rash, soB.  Marland Kitchen Anoro Ellipta [Umeclidinium-Vilanterol] Other (See Comments)    Pt states it caused blisters.  . Flovent Diskus [Fluticasone Propionate (Inhal)]     headache  . Ivermectin Nausea And Vomiting    N/V and rash  . Spiriva Handihaler [Tiotropium Bromide Monohydrate] Other (See Comments)    Severe headaches  . Symbicort [Budesonide-Formoterol Fumarate]     headache  . Betadine [Povidone Iodine] Rash  . Latex Hives  . Lipitor [Atorvastatin] Itching  . Permethrin Itching  . Stiolto Respimat [Tiotropium Bromide-Olodaterol] Other (See Comments)    Mouth ulcers  . Tape Dermatitis    Paper tape and clear  . Trelegy Ellipta [Fluticasone-Umeclidin-Vilant] Other (See Comments)    headaches    ROS As per HPI  PE: Blood pressure 113/77, pulse 95, temperature 99.1 F (37.3 C), temperature source Temporal, resp. rate 16, height 5\' 3"  (1.6 m), weight 266 lb 12.8 oz (121 kg), SpO2 94 %. Gen: Alert, well appearing.  Patient is oriented to person, place, time, and situation. AFFECT: pleasant, lucid thought and speech. ENT: Ears: EACs clear, normal epithelium.  TMs with good light reflex and landmarks bilaterally.  Eyes: no injection, icteris, swelling, or exudate.  EOMI, PERRLA. Nose: no drainage or turbinate edema/swelling.  No injection or focal lesion.  Mouth: lips without lesion/swelling.  Oral mucosa pink and moist.  Dentition intact and without obvious caries or gingival swelling.  Oropharynx without erythema, exudate, or swelling.  She has TTP with mild palpable (w/out induration or overlying erythema) lymphadenopathy in cervical nodes region just posterior to angle of L mandible.  Bimanual exam of submandibular gland on L showed NO enlargement and no tenderness.    LABS:    Chemistry      Component Value Date/Time   NA 138 01/01/2019 1329   K 4.4 01/01/2019 1329   CL 101 01/01/2019 1329   CO2 26 01/01/2019 1329   BUN 12 01/01/2019 1329    CREATININE 0.58 01/01/2019 1329      Component Value Date/Time   CALCIUM 8.9 01/01/2019 1329   ALKPHOS 93 12/04/2018 1057   AST 8 12/04/2018 1057   ALT 9 12/04/2018 1057   BILITOT 0.4 12/04/2018 1057     Lab Results  Component Value Date   WBC 9.8 09/25/2018   HGB 14.3 09/25/2018   HCT 45.5 09/25/2018   MCV 91.5 09/25/2018   PLT 265 09/25/2018   Lab Results  Component Value Date   CHOL 247 (H) 12/04/2018   HDL 38.50 (L) 12/04/2018   LDLCALC 169 (H) 03/24/2014   LDLDIRECT 177.0 12/04/2018   TRIG 324.0 (H) 12/04/2018   CHOLHDL 6 12/04/2018   Lab Results  Component Value Date   TSH 1.12 10/08/2016   Lab Results  Component Value Date   HGBA1C 5.8 09/24/2018   IMPRESSION AND PLAN:  1) Cervical lymphadenitis, left retroauricular area. Augmentin 875mg  bid  10d. CBC w/diff today. Recheck in 10d in office to make sure LAD resolved with this treatment. If it is not then I'll do CT soft tissue neck.  2) Hypercholesterolemia: I increased her pravastatin to 80mg  about 2 mo ago. Checking FLP and AST/ALT today.  An After Visit Summary was printed and given to the patient.  FOLLOW UP: Return in about 10 days (around 02/16/2019) for f/u cervical lymphadenitis.  Signed:  Crissie Sickles, MD  02/06/2019    

## 2019-02-06 NOTE — Addendum Note (Signed)
Addended by: Ralph Dowdy on: 02/06/2019 02:21 PM   Modules accepted: Orders

## 2019-02-07 LAB — CBC WITH DIFFERENTIAL/PLATELET
Absolute Monocytes: 418 cells/uL (ref 200–950)
Basophils Absolute: 29 cells/uL (ref 0–200)
Basophils Relative: 0.3 %
Eosinophils Absolute: 143 cells/uL (ref 15–500)
Eosinophils Relative: 1.5 %
HCT: 43 % (ref 35.0–45.0)
Hemoglobin: 14.4 g/dL (ref 11.7–15.5)
Lymphs Abs: 2546 cells/uL (ref 850–3900)
MCH: 29.7 pg (ref 27.0–33.0)
MCHC: 33.5 g/dL (ref 32.0–36.0)
MCV: 88.7 fL (ref 80.0–100.0)
MPV: 9.7 fL (ref 7.5–12.5)
Monocytes Relative: 4.4 %
Neutro Abs: 6365 cells/uL (ref 1500–7800)
Neutrophils Relative %: 67 %
Platelets: 278 10*3/uL (ref 140–400)
RBC: 4.85 10*6/uL (ref 3.80–5.10)
RDW: 13.1 % (ref 11.0–15.0)
Total Lymphocyte: 26.8 %
WBC: 9.5 10*3/uL (ref 3.8–10.8)

## 2019-02-07 LAB — LIPID PANEL
Cholesterol: 226 mg/dL — ABNORMAL HIGH (ref <200)
HDL: 40 mg/dL — ABNORMAL LOW (ref >=50)
LDL Cholesterol (Calc): 144 mg/dL (calc) — ABNORMAL HIGH
Non-HDL Cholesterol (Calc): 186 mg/dL (calc) — ABNORMAL HIGH (ref <130)
Total CHOL/HDL Ratio: 5.7 (calc) — ABNORMAL HIGH (ref <5.0)
Triglycerides: 276 mg/dL — ABNORMAL HIGH (ref <150)

## 2019-02-07 LAB — AST: AST: 8 U/L — ABNORMAL LOW (ref 10–35)

## 2019-02-07 LAB — ALT: ALT: 6 U/L (ref 6–29)

## 2019-02-09 ENCOUNTER — Telehealth: Payer: Self-pay | Admitting: Pharmacist

## 2019-02-09 ENCOUNTER — Telehealth: Payer: Self-pay | Admitting: Family Medicine

## 2019-02-09 MED ORDER — PENICILLIN V POTASSIUM 500 MG PO TABS: 500.0000 mg | ORAL_TABLET | Freq: Four times a day (QID) | ORAL | 0 refills | Status: DC

## 2019-02-09 NOTE — Telephone Encounter (Signed)
Patient advised of med recommendations, voiced understanding.

## 2019-02-09 NOTE — Telephone Encounter (Signed)
Patient was given Augmentin 875mg  bid 10d on 02/06/19. She now has diarrhea, no other side effects or sxs.  Please advise, thanks.

## 2019-02-09 NOTE — Telephone Encounter (Signed)
Called pt on 02/09/2019 at 3:50 PM.   Reached out to patient to determine if she would be interested in making a pharmacy appt at this time for medication reconciliation. Patient states she is very busy right now with multiple doctor appts and is unable to make a pharmacy appt. She states her doctors have been over her medications with her, specifically to de-escalate/simplify her medication regimen. She claims her medication regimen is as concise as possible at the moment. She states she has a friend who comes to her appts who understands all medication dosing/administration/side effects. Provided pt with clinic phone number and recommended pt to follow up to schedule pharmacy appt as needed. Pt verbalized understanding.  Drexel Iha, PharmD PGY2 Ambulatory Care Pharmacy Resident

## 2019-02-09 NOTE — Telephone Encounter (Signed)
Patient has developed diarrhea. She would like to try a different antibotic. Please call patient back.

## 2019-02-09 NOTE — Telephone Encounter (Signed)
OK, I'll eRx penicillin. May take imodium otc 1-2 times a day as needed for the diarrhea, but this should let up a day or so after stopping augmentin.

## 2019-02-10 ENCOUNTER — Other Ambulatory Visit: Payer: Self-pay

## 2019-02-10 DIAGNOSIS — E7800 Pure hypercholesterolemia, unspecified: Secondary | ICD-10-CM

## 2019-02-10 MED ORDER — ROSUVASTATIN CALCIUM 10 MG PO TABS: 10.0000 mg | ORAL_TABLET | Freq: Every day | ORAL | 2 refills | Status: DC

## 2019-02-11 DIAGNOSIS — J45909 Unspecified asthma, uncomplicated: Secondary | ICD-10-CM | POA: Diagnosis not present

## 2019-02-11 DIAGNOSIS — J449 Chronic obstructive pulmonary disease, unspecified: Secondary | ICD-10-CM | POA: Diagnosis not present

## 2019-02-11 DIAGNOSIS — J381 Polyp of vocal cord and larynx: Secondary | ICD-10-CM | POA: Diagnosis not present

## 2019-02-11 DIAGNOSIS — J383 Other diseases of vocal cords: Secondary | ICD-10-CM | POA: Diagnosis not present

## 2019-02-11 DIAGNOSIS — Z993 Dependence on wheelchair: Secondary | ICD-10-CM | POA: Diagnosis not present

## 2019-02-11 DIAGNOSIS — F17210 Nicotine dependence, cigarettes, uncomplicated: Secondary | ICD-10-CM | POA: Diagnosis not present

## 2019-02-11 HISTORY — PX: OTHER SURGICAL HISTORY: SHX169

## 2019-02-16 ENCOUNTER — Encounter: Payer: Self-pay | Admitting: Cardiology

## 2019-02-16 NOTE — Progress Notes (Deleted)
Cardiology Office Note  Date: 02/16/2019   ID: Martha Espinoza, DOB [birth year], MRN [id number]  PCP:  Martha Sou, MD  Consulting Cardiologist: Martha Sark, MD Electrophysiologist:  None   No chief complaint on file.   History of Present Illness: Martha Espinoza is a 53 y.o. female referred for cardiology consultation by Dr. Anitra Espinoza for evaluation of atypical chest pain.  I reviewed cardiac records, patient was evaluated by Dr. Tamala Espinoza back in 2016 at which point she underwent a cardiac catheterization that did not demonstrate any significant obstructive CAD.  Past Medical History:  Diagnosis Date   Abnormal CT of thoracic spine 09/12/2018   Noncontrast.  Increased in number and TINY increase in size of tiny, patchy sclerotic T spine lesions compared to 2018 CT.  Hem/onc summer 2020->w/u->suspected benign bone islands->survellance imaging in 6-12 mo planned.   Allergic rhinitis    Arthritis    Asthma    Bipolar 1 disorder (HCC)    Chronic pain syndrome    chronic low back pain (Pain mgmt= Dr. [name]).  MRI showed no signif disc dz, only showed some facet arthropathy at L4-5, with joint diastasis at L4-5--plan as of 11/07/16 neurosurg eval is bilat facet injections.   Colitis due to Clostridium difficile 2001   COPD (chronic obstructive pulmonary disease) ([place])    PFTs 04/2016 showed no sign of copd or asthma, plus she actually had worse FEV1 after taking albuterol.  However, as of 06/2018 she has recent hx of improvement in sx's after taking albuterol.  LONG TIME SMOKER, won't quit.  Intolerant to or cannot afford: pulmicort, QVAR, advair, symbicort, flovent, bevespi, and spiriva, anoro,stiolto, and trelegy. Pulmicort trial per pulm 12/2018   Depression    GAD (generalized anxiety disorder)    GERD (gastroesophageal reflux disease)    Hepatic hemangioma    Hepatic steatosis 06/2016   Noted incidentally on CT chest   History of Salmonella  gastroenteritis    Hyperlipidemia    a. Noted 02/2014.   Hypertension    IBS (irritable bowel syndrome)    Iron deficiency anemia 05/2014   Per pt it is not due to vaginal blood loss; hemoccults sent to pt in mail 413/16.   Lumbar spondylosis    Mild, mainly focused at L4-5 and L5-S1.  MRI L spine 09/09/17-->mild degenerative disc disease and facet arthrosis without any nerve root encroachment, no spinal stenosis.   Microscopic hematuria    Intermittent (no w/u done yet, as of 03/2014)   Migraine headache    Morbid obesity ([place])    Multiple lipomas 09/2016   Left upper arm--very small ones.   Nocturnal hypoxia 09/18/2017   Overnight oximetry -->qualified: ordered 2L oxygen during sleep.   Obesity hypoventilation syndrome (HCC)    Pain in both lower legs 2018-2019   Pain and numbness bilat LLs.  NCS/EMGs findings suggestive of S1 radiculopathy bilat: sx's + NCS findings suggestive of neurogenic claudication/spinal stenosis.  ? contribution of venous reflux?   Peripheral edema Fall 2018   R>L, non-pitting for the most part--venous doppler neg for DVT 01/2017.  Saw Dr. Bridgett Espinoza (Vasc) 04/12/17 and u/s showed some venous reflux dz but he felt her sx's in legs were NOT due to her venous insufficiency OR PAD.  Thigh high comp stockings rx'd.   Polycystic ovarian syndrome    Pruritic dermatitis 2015/2016   +Scabies prep at [facility] 07/2014. (Primarily pruritic skin, but eventually a subtle rash as well)   PTSD (  post-traumatic stress disorder)    Raynaud's phenomenon 2019   Rectal cancer ([place])    a. Followed by Dr. Gala Espinoza, dx 2000-2001.  Surveillance colonoscopy planned as of 11/2018 GI f/u.   Right shoulder pain 2017   Murphy/Wainer: RC bursitis + RC tear (MRI)--injection trial is plan per pt report 05/04/16.   Vitamin D deficiency    Wheezing    Most of her resp symptoms are c/w upper airway etiology (repeated PFTs ->no obstructive dz). Pulm referred her to ENT  12/2018.    Past Surgical History:  Procedure Laterality Date   ABI's complete Bilateral 09/30/2017   Normal ABIs.  Great toe pressures adequate for wound healing.  Femoral artery waveforms triphasic (normal).   CARDIOVASCULAR STRESS TEST  03/24/14   Lexscan MIBI: mild anterior ischemia?  Cardiac CT angiogram recommended/done.   CHOLECYSTECTOMY     COLONOSCOPY  05/05/2007   adenoma   coronary CT angio  03/29/14   Two vessel dz/moderate stenosis of mid LAD and proximal RCA; cath recommended.   DILATION AND CURETTAGE OF UTERUS     for vaginal bleeding   JOINT REPLACEMENT     LEFT HEART CATHETERIZATION WITH CORONARY ANGIOGRAM N/A 03/31/2014   Normal coronaries, EF 0000000, diastolic dysfunction.  Procedure: LEFT HEART CATHETERIZATION WITH CORONARY ANGIOGRAM;  Surgeon: Sinclair Grooms, MD;  Location: [facility] CATH LAB;  Service: Cardiovascular;  Laterality: N/A;   LOWER EXT VENOUS DOPPLERS Bilateral 02/08/2017   NEG for DVT (bilat)   NCS/EMG  10/03/2017   S1 radiculopathy bilat   Overnight oximetry testing  09/2017   qualified for oxygen supplementation during sleep   PFTs  04/2016   No sign of COPD or asthma; FEV 1 worse after albut.   rectal mss  age 79   High-grade rectal adenoma removed from rectum    TRANSTHORACIC ECHOCARDIOGRAM  03/23/14; 10/21/17   2015 Normal.  2019: mild LVH, EF 65-70%, study technically inadequate to assess diastolic function, valves were fine, wall motion normal.    Current Outpatient Medications  Medication Sig Dispense Refill   albuterol (PROVENTIL) (2.5 MG/3ML) 0.083% nebulizer solution Take 3 mLs (2.5 mg total) by nebulization every 6 (six) hours as needed for wheezing or shortness of breath. 75 mL 6   albuterol (VENTOLIN HFA) 108 (90 Base) MCG/ACT inhaler INHALE 1 TO 2 PUFFS BY MOUTH EVERY 4 HOURS AS NEEDED (Patient taking differently: Inhale 1-2 puffs into the lungs 4 (four) times daily. ) 16 g 11   AMITIZA 24 MCG capsule Take 24 mcg by mouth 2  (two) times daily.  1   amoxicillin-clavulanate (AUGMENTIN) 875-125 MG tablet Take 1 tablet by mouth 2 (two) times daily. 20 tablet 0   aspirin 81 MG chewable tablet Chew 1 tablet (81 mg total) by mouth daily. (Patient taking differently: Chew 81 mg by mouth every evening. ) 14 tablet 0   azelastine (ASTELIN) 0.1 % nasal spray Place 2 sprays into both nostrils 2 (two) times daily. 30 mL 6   budesonide (PULMICORT) 0.5 MG/2ML nebulizer solution Take 2 mLs by nebulization 2 (two) times daily.     busPIRone (BUSPAR) 15 MG tablet Take 30 mg by mouth See admin instructions. TAKE 2 TABLETS (30 MG) BY MOUTH AT BEDTIME.     clonazePAM (KLONOPIN) 0.5 MG tablet TAKE 1 TABLET BY MOUTH DURING THE DAY AS NEEDED AND 2 TABLETS AT BEDTIME (Patient taking differently: Take 0.5-1 mg by mouth See admin instructions. TAKE 1 TABLET (0.5 MG) BY MOUTH DAILY IN  THE MORNING & TAKE 2 TABLETS (1 MG) BY MOUTH AT BEDTIME.) 90 tablet 5   cyclobenzaprine (FLEXERIL) 10 MG tablet TAKE ONE TABLET BY MOUTH EVERY 6 HOURS AS NEEDED (SPASM RELATED TO HEADACHES) (Patient taking differently: Take 10 mg by mouth 3 (three) times daily. ) 42 tablet 5   diphenhydrAMINE (BENADRYL) 25 MG tablet Take 1 tablet (25 mg total) by mouth every 6 (six) hours. (Patient taking differently: Take 25 mg by mouth every 6 (six) hours as needed (allergic reactions). ) 20 tablet 0   EQ ALLERGY RELIEF, CETIRIZINE, 10 MG tablet Take 1 tablet by mouth once daily (Patient taking differently: Take 10 mg by mouth at bedtime. ) 90 tablet 1   ferrous sulfate 325 (65 FE) MG EC tablet Take 325 mg by mouth every evening.     fluticasone (FLONASE) 50 MCG/ACT nasal spray Place 2 sprays into both nostrils daily as needed for allergies.     formoterol (PERFOROMIST) 20 MCG/2ML nebulizer solution Take 2 mLs (20 mcg total) by nebulization 2 (two) times daily. 120 mL 11   lamoTRIgine (LAMICTAL) 100 MG tablet Take 200 mg by mouth at bedtime.     losartan (COZAAR) 50 MG  tablet Take 1 tablet by mouth once daily (Patient taking differently: Take 50 mg by mouth every evening. ) 90 tablet 1   meloxicam (MOBIC) 15 MG tablet Take 15 mg by mouth every evening.   1   metoprolol tartrate (LOPRESSOR) 50 MG tablet Take 1 tablet by mouth twice daily (Patient taking differently: Take 50 mg by mouth 2 (two) times daily. ) 180 tablet 1   Naldemedine Tosylate (SYMPROIC) 0.2 MG TABS Take 0.2 mg by mouth at bedtime.      nitroGLYCERIN (NITROSTAT) 0.4 MG SL tablet DISSOLVE ONE TABLET UNDER THE TONGUE EVERY 5 MINUTES AS NEEDED FOR CHEST PAIN.  DO NOT EXCEED A TOTAL OF 3 DOSES IN 15 MINUTES (Patient taking differently: Place 0.4 mg under the tongue every 5 (five) minutes x 3 doses as needed for chest pain. ) 10 tablet 0   oxyCODONE-acetaminophen (PERCOCET) 10-325 MG tablet Take 1 tablet by mouth 4 (four) times daily.      pantoprazole (PROTONIX) 40 MG tablet      penicillin v potassium (VEETID) 500 MG tablet Take 1 tablet (500 mg total) by mouth 4 (four) times daily. 40 tablet 0   Potassium (POTASSIMIN PO) Take 2 tablets by mouth at bedtime.     pregabalin (LYRICA) 200 MG capsule      promethazine (PHENERGAN) 25 MG tablet 1 tab po q6h prn nausea (Patient taking differently: Take 25 mg by mouth every 6 (six) hours as needed (nausea). ) 30 tablet 3   rOPINIRole (REQUIP) 0.25 MG tablet TAKE 3 TABLETS BY MOUTH AT BEDTIME (Patient taking differently: Take 0.75 mg by mouth at bedtime. ) 270 tablet 0   rosuvastatin (CRESTOR) 10 MG tablet Take 1 tablet (10 mg total) by mouth daily. 30 tablet 2   sertraline (ZOLOFT) 50 MG tablet Take 1 tablet (50 mg total) by mouth daily. 90 tablet 0   traZODone (DESYREL) 150 MG tablet Take 1 tablet (150 mg total) by mouth at bedtime. 90 tablet 0   No current facility-administered medications for this visit.    Allergies:  Contrast media [iodinated diagnostic agents], Shellfish allergy, Codeine, Furosemide, Anoro ellipta  [umeclidinium-vilanterol], Flovent diskus [fluticasone propionate (inhal)], Ivermectin, Spiriva handihaler [tiotropium bromide monohydrate], Symbicort [budesonide-formoterol fumarate], Betadine [povidone iodine], Latex, Lipitor [atorvastatin], Permethrin, Stiolto respimat [tiotropium  bromide-olodaterol], Tape, and Trelegy ellipta [fluticasone-umeclidin-vilant]   Social History: The patient  reports that she has been smoking cigarettes. She has a 18.00 pack-year smoking history. She has never used smokeless tobacco. She reports that she does not drink alcohol or use drugs.   Family History: The patient's family history includes Anxiety disorder in her mother; Bipolar disorder in her son; Cancer in her father; Colon cancer in her maternal grandfather; Colon cancer (age of onset: 22) in her father; Depression in her mother; Diabetes in her mother; Heart attack in her mother; Heart disease in her mother; Hyperlipidemia in her mother; Hypertension in her mother; Mental illness in her mother; Thyroid disease in her mother.   ROS:  Please see the history of present illness. Otherwise, complete review of systems is positive for {NONE DEFAULTED:18576::"none"}.  All other systems are reviewed and negative.   Physical Exam: VS:  There were no vitals taken for this visit., BMI There is no height or weight on file to calculate BMI.  Wt Readings from Last 3 Encounters:  02/06/19 266 lb 12.8 oz (121 kg)  12/31/18 271 lb (122.9 kg)  12/04/18 265 lb (120.2 kg)    General: Patient appears comfortable at rest. HEENT: Conjunctiva and lids normal, oropharynx clear with moist mucosa. Neck: Supple, no elevated JVP or carotid bruits, no thyromegaly. Lungs: Clear to auscultation, nonlabored breathing at rest. Cardiac: Regular rate and rhythm, no S3 or significant systolic murmur, no pericardial rub. Abdomen: Soft, nontender, no hepatomegaly, bowel sounds present, no guarding or rebound. Extremities: No pitting edema,  distal pulses 2+. Skin: Warm and dry. Musculoskeletal: No kyphosis. Neuropsychiatric: Alert and oriented x3, affect grossly appropriate.  ECG:  An ECG dated 05/23/2017 was personally reviewed today and demonstrated:  Sinus tachycardia with borderline low voltage in the precordial leads and decreased R wave progression.  Recent Labwork: 01/01/2019: BUN 12; Creatinine, Ser 0.58; Potassium 4.4; Sodium 138 02/06/2019: ALT 6; AST 8; Hemoglobin 14.4; Platelets 278     Component Value Date/Time   CHOL 226 (H) 02/06/2019 1422   TRIG 276 (H) 02/06/2019 1422   HDL 40 (L) 02/06/2019 1422   CHOLHDL 5.7 (H) 02/06/2019 1422   VLDL 64.8 (H) 12/04/2018 1057   LDLCALC 144 (H) 02/06/2019 1422   LDLDIRECT 177.0 12/04/2018 1057    Other Studies Reviewed Today:  Cardiac catheterization 03/31/2014: HEMODYNAMICS:  Aortic pressure 139/85 mmHg; LV pressure 145/15 mmHg; LVEDP 22 mmHg  ANGIOGRAPHIC DATA:   The left main coronary artery is normal.  The left anterior descending artery is widely patent in the proximal and mid vessel. The vessel tapers in the mid to distal segment without significant obstruction. A large first diagonal appears normal. The mid to distal LAD may have intramyocardial course..  The left circumflex artery is widely patent. Gives origin to 2 obtuse marginal branches. The first marginal arises GERD proximally, is small, and has a core second be compatible with the ramus intermedius. No significant obstruction is noted. The second marginal is large and trifurcates on the inferolateral wall. No obstruction is noted.  The right coronary artery is codominant. The vessel is normal..   LEFT VENTRICULOGRAM:  Left ventricular angiogram was done in the 30 RAO projection and revealed upper limit of normal cavity size with low normal contractility and an estimated ejection fraction of 50-55%.   IMPRESSIONS:  1. Essentially normal coronary arteries. The mid to distal LAD tapers in the mid  to distal segment possibly because of an intramyocardial course. No focal obstruction  is noted in any coronary territory. 2. Normal normal left ventricular systolic function with elevated end-diastolic pressure compatible with diastolic dysfunction  Echocardiogram 10/21/2017: Study Conclusions  - Left ventricle: The cavity size was normal. Wall thickness was   increased in a pattern of mild LVH. Systolic function was   vigorous. The estimated ejection fraction was in the range of 65%   to 70%. Wall motion was normal; there were no regional wall   motion abnormalities. The study is not technically sufficient to   allow evaluation of LV diastolic function. - Aortic valve: Mildly calcified annulus. Trileaflet; normal   thickness leaflets. Valve area (VTI): 2.32 cm^2. Valve area   (Vmax): 2.11 cm^2. - Technically adequate study.  Assessment and Plan:   Medication Adjustments/Labs and Tests Ordered: Current medicines are reviewed at length with the patient today.  Concerns regarding medicines are outlined above.   Tests Ordered: No orders of the defined types were placed in this encounter.   Medication Changes: No orders of the defined types were placed in this encounter.   Disposition:  Follow up {follow up:15908}  Signed, Martha Sark, MD, [facility] 02/16/2019 9:35 AM     [facility] [address] [place], [place] [address] Phone: [phone]; Fax: [phone]

## 2019-02-17 ENCOUNTER — Ambulatory Visit: Payer: Commercial Managed Care - PPO | Admitting: Cardiology

## 2019-02-18 ENCOUNTER — Other Ambulatory Visit: Payer: Self-pay

## 2019-02-18 ENCOUNTER — Encounter: Payer: Self-pay | Admitting: Family Medicine

## 2019-02-18 ENCOUNTER — Ambulatory Visit (INDEPENDENT_AMBULATORY_CARE_PROVIDER_SITE_OTHER): Payer: Commercial Managed Care - PPO | Admitting: Family Medicine

## 2019-02-18 VITALS — BP 113/75 | HR 90 | Temp 98.0°F | Resp 16 | Ht 63.0 in | Wt 263.2 lb

## 2019-02-18 DIAGNOSIS — Z716 Tobacco abuse counseling: Secondary | ICD-10-CM | POA: Diagnosis not present

## 2019-02-18 DIAGNOSIS — F17200 Nicotine dependence, unspecified, uncomplicated: Secondary | ICD-10-CM

## 2019-02-18 DIAGNOSIS — I889 Nonspecific lymphadenitis, unspecified: Secondary | ICD-10-CM | POA: Diagnosis not present

## 2019-02-18 MED ORDER — CHANTIX STARTING MONTH PAK 0.5 MG X 11 & 1 MG X 42 PO TABS: ORAL_TABLET | ORAL | 0 refills | Status: DC

## 2019-02-18 MED ORDER — VARENICLINE TARTRATE 1 MG PO TABS: 1.0000 mg | ORAL_TABLET | Freq: Two times a day (BID) | ORAL | 1 refills | Status: DC

## 2019-02-18 NOTE — Progress Notes (Signed)
OFFICE VISIT  02/18/2019   CC:  Chief Complaint  Patient presents with  . Follow-up    lymphadenitis    HPI:    Patient is a 53 y.o. Caucasian female who presents for 12 day f/u lymphadenitis (L retroauricular region), treated with penicillin (initially augmentin but this caused intolerable diarrhea).  Interim hx: Has occ mild feeling of pain on L anterior neck focally, different spots at different times.  Nothing severe or persistent.  No ear or jaw pain. Neck doesn't feel swollen anymore.  No f/c.    Recently saw ENT MD 02/11/19 for chronic hoarseness. 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  Will likely get the leukoplakia area bx'd at f/u visit around mid December. Otherwise, smoking cessation was strongly recommended as the primary treatment. Pt has smoked 30+ years, avg 1 ppd, with a 5 yr gap during which she quit--with the help of chantix +motivated by death of family member from lung cancer.  Currently smokes 1 ppd of Pall Mall 100s.  Past Medical History:  Diagnosis Date  . Abnormal CT of thoracic spine 09/12/2018   Noncontrast.  Increased in number and TINY increase in size of tiny, patchy sclerotic T spine lesions compared to 2018 CT.  Hem/onc summer 2020->w/u->suspected benign bone islands->survellance imaging in 6-12 mo planned.  . Allergic rhinitis   . Arthritis   . Asthma   . Bipolar 1 disorder ([place])   . Chronic pain syndrome    chronic low back pain (Pain mgmt= Dr. [name]).  MRI showed no signif disc dz, only showed some facet arthropathy at L4-5, with joint diastasis at L4-5--plan as of 11/07/16 neurosurg eval is bilat facet injections.  . Colitis due to Clostridium difficile 2001  . COPD (chronic obstructive pulmonary disease) ([place])    PFTs 04/2016 showed no sign of copd or asthma, plus she actually had worse FEV1 after taking albuterol.  However, as of 06/2018 she has recent hx of  improvement in sx's after taking albuterol.  LONG TIME SMOKER, won't quit.  Intolerant to or cannot afford: pulmicort, QVAR, advair, symbicort, flovent, bevespi, and spiriva, anoro,stiolto, and trelegy. Pulmicort trial per pulm 12/2018  . Depression   . GAD (generalized anxiety disorder)   . GERD (gastroesophageal reflux disease)   . Hepatic hemangioma   . Hepatic steatosis 06/2016   Noted incidentally on CT chest  . History of Salmonella gastroenteritis   . Hyperlipidemia    a. Noted 02/2014.  Marland Kitchen Hypertension   . IBS (irritable bowel syndrome)   . Iron deficiency anemia 05/2014   Per pt it is not due to vaginal blood loss; hemoccults sent to pt in mail 413/16.  . Lumbar spondylosis    Mild, mainly focused at L4-5 and L5-S1.  MRI L spine 09/09/17-->mild degenerative disc disease and facet arthrosis without any nerve root encroachment, no spinal stenosis.  . Microscopic hematuria    Intermittent (no w/u done yet, as of 03/2014)  . Migraine headache   . Morbid obesity ([place])   . Multiple lipomas 09/2016   Left upper arm--very small ones.  . Nocturnal hypoxia 09/18/2017   Overnight oximetry -->qualified: ordered 2L oxygen during sleep.  . Obesity hypoventilation syndrome ([place])   . Pain in both lower legs 2018-2019   Pain and numbness bilat LLs.  NCS/EMGs findings suggestive of S1 radiculopathy bilat: sx's + NCS findings suggestive of neurogenic claudication/spinal stenosis.  ? contribution of venous reflux?  Marland Kitchen  Peripheral edema Fall 2018   R>L, non-pitting for the most part--venous doppler neg for DVT 01/2017.  Saw Dr. Bridgett Larsson (Vasc) 04/12/17 and u/s showed some venous reflux dz but he felt her sx's in legs were NOT due to her venous insufficiency OR PAD.  Thigh high comp stockings rx'd.  . Polycystic ovarian syndrome   . Pruritic dermatitis 2015/2016   +Scabies prep at [facility] 07/2014. (Primarily pruritic skin, but eventually a subtle rash as well)  . PTSD (post-traumatic stress  disorder)   . Raynaud's phenomenon 2019  . Rectal cancer ([place])    a. Followed by Dr. Gala Romney, dx 2000-2001.  Surveillance colonoscopy planned as of 11/2018 GI f/u.  . Right shoulder pain 2017   Murphy/Wainer: RC bursitis + RC tear (MRI)--injection trial is plan per pt report 05/04/16.  . Vitamin D deficiency   . Wheezing    Most of her resp symptoms are c/w upper airway etiology (repeated PFTs ->no obstructive dz). Pulm referred her to ENT 12/2018.    Past Surgical History:  Procedure Laterality Date  . ABI's complete Bilateral 09/30/2017   Normal ABIs.  Great toe pressures adequate for wound healing.  Femoral artery waveforms triphasic (normal).  . CARDIOVASCULAR STRESS TEST  03/24/14   Lexscan MIBI: mild anterior ischemia?  Cardiac CT angiogram recommended/done.  . CHOLECYSTECTOMY    . COLONOSCOPY  05/05/2007   adenoma  . coronary CT angio  03/29/14   Two vessel dz/moderate stenosis of mid LAD and proximal RCA; cath recommended.  Marland Kitchen DILATION AND CURETTAGE OF UTERUS     for vaginal bleeding  . JOINT REPLACEMENT    . LEFT HEART CATHETERIZATION WITH CORONARY ANGIOGRAM N/A 03/31/2014   Normal coronaries, EF 0000000, diastolic dysfunction.  Procedure: LEFT HEART CATHETERIZATION WITH CORONARY ANGIOGRAM;  Surgeon: Sinclair Grooms, MD;  Location: [facility] CATH LAB;  Service: Cardiovascular;  Laterality: N/A;  . LOWER EXT VENOUS DOPPLERS Bilateral 02/08/2017   NEG for DVT (bilat)  . NCS/EMG  10/03/2017   S1 radiculopathy bilat  . Overnight oximetry testing  09/2017   qualified for oxygen supplementation during sleep  . PFTs  04/2016   No sign of COPD or asthma; FEV 1 worse after albut.  . rectal mss  age 33   High-grade rectal adenoma removed from rectum   . TRANSTHORACIC ECHOCARDIOGRAM  03/23/14; 10/21/17   2015 Normal.  2019: mild LVH, EF 65-70%, study technically inadequate to assess diastolic function, valves were fine, wall motion normal.    Outpatient Medications Prior to Visit  Medication Sig  Dispense Refill  . albuterol (PROVENTIL) (2.5 MG/3ML) 0.083% nebulizer solution Take 3 mLs (2.5 mg total) by nebulization every 6 (six) hours as needed for wheezing or shortness of breath. 75 mL 6  . albuterol (VENTOLIN HFA) 108 (90 Base) MCG/ACT inhaler INHALE 1 TO 2 PUFFS BY MOUTH EVERY 4 HOURS AS NEEDED (Patient taking differently: Inhale 1-2 puffs into the lungs 4 (four) times daily. ) 16 g 11  . AMITIZA 24 MCG capsule Take 24 mcg by mouth 2 (two) times daily.  1  . aspirin 81 MG chewable tablet Chew 1 tablet (81 mg total) by mouth daily. (Patient taking differently: Chew 81 mg by mouth every evening. ) 14 tablet 0  . azelastine (ASTELIN) 0.1 % nasal spray Place 2 sprays into both nostrils 2 (two) times daily. 30 mL 6  . budesonide (PULMICORT) 0.5 MG/2ML nebulizer solution Take 2 mLs by nebulization 2 (two) times daily.    Marland Kitchen  busPIRone (BUSPAR) 15 MG tablet Take 30 mg by mouth See admin instructions. TAKE 2 TABLETS (30 MG) BY MOUTH AT BEDTIME.    . clonazePAM (KLONOPIN) 0.5 MG tablet TAKE 1 TABLET BY MOUTH DURING THE DAY AS NEEDED AND 2 TABLETS AT BEDTIME (Patient taking differently: Take 0.5-1 mg by mouth See admin instructions. TAKE 1 TABLET (0.5 MG) BY MOUTH DAILY IN THE MORNING & TAKE 2 TABLETS (1 MG) BY MOUTH AT BEDTIME.) 90 tablet 5  . cyclobenzaprine (FLEXERIL) 10 MG tablet TAKE ONE TABLET BY MOUTH EVERY 6 HOURS AS NEEDED (SPASM RELATED TO HEADACHES) (Patient taking differently: Take 10 mg by mouth 3 (three) times daily. ) 42 tablet 5  . diphenhydrAMINE (BENADRYL) 25 MG tablet Take 1 tablet (25 mg total) by mouth every 6 (six) hours. (Patient taking differently: Take 25 mg by mouth every 6 (six) hours as needed (allergic reactions). ) 20 tablet 0  . EQ ALLERGY RELIEF, CETIRIZINE, 10 MG tablet Take 1 tablet by mouth once daily (Patient taking differently: Take 10 mg by mouth at bedtime. ) 90 tablet 1  . ferrous sulfate 325 (65 FE) MG EC tablet Take 325 mg by mouth every evening.    .  fluticasone (FLONASE) 50 MCG/ACT nasal spray Place 2 sprays into both nostrils daily as needed for allergies.    . formoterol (PERFOROMIST) 20 MCG/2ML nebulizer solution Take 2 mLs (20 mcg total) by nebulization 2 (two) times daily. 120 mL 11  . lamoTRIgine (LAMICTAL) 100 MG tablet Take 200 mg by mouth at bedtime.    Marland Kitchen losartan (COZAAR) 50 MG tablet Take 1 tablet by mouth once daily (Patient taking differently: Take 50 mg by mouth every evening. ) 90 tablet 1  . meloxicam (MOBIC) 15 MG tablet Take 15 mg by mouth every evening.   1  . metoprolol tartrate (LOPRESSOR) 50 MG tablet Take 1 tablet by mouth twice daily (Patient taking differently: Take 50 mg by mouth 2 (two) times daily. ) 180 tablet 1  . Naldemedine Tosylate (SYMPROIC) 0.2 MG TABS Take 0.2 mg by mouth at bedtime.     . nitroGLYCERIN (NITROSTAT) 0.4 MG SL tablet DISSOLVE ONE TABLET UNDER THE TONGUE EVERY 5 MINUTES AS NEEDED FOR CHEST PAIN.  DO NOT EXCEED A TOTAL OF 3 DOSES IN 15 MINUTES (Patient taking differently: Place 0.4 mg under the tongue every 5 (five) minutes x 3 doses as needed for chest pain. ) 10 tablet 0  . oxyCODONE-acetaminophen (PERCOCET) 10-325 MG tablet Take 1 tablet by mouth 4 (four) times daily.     . pantoprazole (PROTONIX) 40 MG tablet     . penicillin v potassium (VEETID) 500 MG tablet Take 1 tablet (500 mg total) by mouth 4 (four) times daily. 40 tablet 0  . Potassium (POTASSIMIN PO) Take 2 tablets by mouth at bedtime.    . pregabalin (LYRICA) 200 MG capsule     . promethazine (PHENERGAN) 25 MG tablet 1 tab po q6h prn nausea (Patient taking differently: Take 25 mg by mouth every 6 (six) hours as needed (nausea). ) 30 tablet 3  . rOPINIRole (REQUIP) 0.25 MG tablet TAKE 3 TABLETS BY MOUTH AT BEDTIME (Patient taking differently: Take 0.75 mg by mouth at bedtime. ) 270 tablet 0  . rosuvastatin (CRESTOR) 10 MG tablet Take 1 tablet (10 mg total) by mouth daily. 30 tablet 2  . sertraline (ZOLOFT) 50 MG tablet Take 1 tablet  (50 mg total) by mouth daily. 90 tablet 0  . traZODone (DESYREL)  150 MG tablet Take 1 tablet (150 mg total) by mouth at bedtime. 90 tablet 0  . amoxicillin-clavulanate (AUGMENTIN) 875-125 MG tablet Take 1 tablet by mouth 2 (two) times daily. 20 tablet 0   No facility-administered medications prior to visit.     Allergies  Allergen Reactions  . Contrast Media [Iodinated Diagnostic Agents] Anaphylaxis    Needs 13-hour prep  . Shellfish Allergy Anaphylaxis  . Codeine Nausea And Vomiting and Other (See Comments)    Hallucinations, Also sees people that are not there   . Furosemide Rash    Rash, soB.  Marland Kitchen Anoro Ellipta [Umeclidinium-Vilanterol] Other (See Comments)    Pt states it caused blisters.  . Flovent Diskus [Fluticasone Propionate (Inhal)]     headache  . Ivermectin Nausea And Vomiting    N/V and rash  . Spiriva Handihaler [Tiotropium Bromide Monohydrate] Other (See Comments)    Severe headaches  . Symbicort [Budesonide-Formoterol Fumarate]     headache  . Betadine [Povidone Iodine] Rash  . Latex Hives  . Lipitor [Atorvastatin] Itching  . Permethrin Itching  . Stiolto Respimat [Tiotropium Bromide-Olodaterol] Other (See Comments)    Mouth ulcers  . Tape Dermatitis    Paper tape and clear  . Trelegy Ellipta [Fluticasone-Umeclidin-Vilant] Other (See Comments)    headaches    ROS As per HPI  PE: Blood pressure 113/75, pulse 90, temperature 98 F (36.7 C), temperature source Temporal, resp. rate 16, height 5\' 3"  (1.6 m), weight 263 lb 3.2 oz (119.4 kg), SpO2 94 %. Gen: Alert, well appearing.  Patient is oriented to person, place, time, and situation. AFFECT: pleasant, lucid thought and speech. ENT: Ears: EACs clear, normal epithelium.  TMs with good light reflex and landmarks bilaterally.  Eyes: no injection, icteris, swelling, or exudate.  EOMI, PERRLA. Nose: no drainage or turbinate edema/swelling.  No injection or focal lesion.  Mouth: lips without lesion/swelling.   Oral mucosa pink and moist.  Dentition intact and without obvious caries or gingival swelling.  Oropharynx without erythema, exudate, or swelling.   Neck - No masses, LAD, or thyromegaly or limitation in range of motion.  No tenderness.   LABS:   Lab Results  Component Value Date   WBC 9.5 02/06/2019   HGB 14.4 02/06/2019   HCT 43.0 02/06/2019   MCV 88.7 02/06/2019   PLT 278 02/06/2019   Lab Results  Component Value Date   IRON 42 06/22/2014   FERRITIN 22.0 08/24/2015    Lab Results  Component Value Date   CHOL 226 (H) 02/06/2019   HDL 40 (L) 02/06/2019   LDLCALC 144 (H) 02/06/2019   LDLDIRECT 177.0 12/04/2018   TRIG 276 (H) 02/06/2019   CHOLHDL 5.7 (H) 02/06/2019     Chemistry      Component Value Date/Time   NA 138 01/01/2019 1329   K 4.4 01/01/2019 1329   CL 101 01/01/2019 1329   CO2 26 01/01/2019 1329   BUN 12 01/01/2019 1329   CREATININE 0.58 01/01/2019 1329      Component Value Date/Time   CALCIUM 8.9 01/01/2019 1329   ALKPHOS 93 12/04/2018 1057   AST 8 (L) 02/06/2019 1422   ALT 6 02/06/2019 1422   BILITOT 0.4 12/04/2018 1057      IMPRESSION AND PLAN:  1) Cervical lymphadenitis: resolved. Finish last couple days of penicillin.  2) Smoking/tobacco dependence: pt motivated to quit now since dx of vocal cord abnormalities. Chantix has helped her in the past, so will try this again. Therapeutic  expectations and side effect profile of medication discussed today.  Patient's questions answered.  An After Visit Summary was printed and given to the patient.  FOLLOW UP: Return in about 3 months (around 05/21/2019) for annual CPE (fasting).  Signed:  Crissie Sickles, MD           02/18/2019

## 2019-02-25 ENCOUNTER — Encounter (HOSPITAL_COMMUNITY): Payer: Self-pay | Admitting: Licensed Clinical Social Worker

## 2019-02-25 ENCOUNTER — Other Ambulatory Visit: Payer: Self-pay

## 2019-02-25 ENCOUNTER — Ambulatory Visit (INDEPENDENT_AMBULATORY_CARE_PROVIDER_SITE_OTHER): Payer: Commercial Managed Care - PPO | Admitting: Licensed Clinical Social Worker

## 2019-02-25 DIAGNOSIS — F4310 Post-traumatic stress disorder, unspecified: Secondary | ICD-10-CM | POA: Diagnosis not present

## 2019-02-25 DIAGNOSIS — F418 Other specified anxiety disorders: Secondary | ICD-10-CM | POA: Diagnosis not present
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
## 2019-03-01 ENCOUNTER — Emergency Department (HOSPITAL_COMMUNITY): Payer: Commercial Managed Care - PPO

## 2019-03-01 ENCOUNTER — Other Ambulatory Visit: Payer: Self-pay

## 2019-03-01 ENCOUNTER — Encounter (HOSPITAL_COMMUNITY): Payer: Self-pay | Admitting: Emergency Medicine

## 2019-03-01 ENCOUNTER — Emergency Department (HOSPITAL_COMMUNITY)
Admission: EM | Admit: 2019-03-01 | Discharge: 2019-03-01 | Disposition: A | Discharge status: Home / Self Care | Payer: Commercial Managed Care - PPO | Attending: Emergency Medicine | Admitting: Emergency Medicine

## 2019-03-01 DIAGNOSIS — F17210 Nicotine dependence, cigarettes, uncomplicated: Secondary | ICD-10-CM | POA: Insufficient documentation

## 2019-03-01 DIAGNOSIS — S8002XA Contusion of left knee, initial encounter: Secondary | ICD-10-CM

## 2019-03-01 DIAGNOSIS — I10 Essential (primary) hypertension: Secondary | ICD-10-CM | POA: Insufficient documentation

## 2019-03-01 DIAGNOSIS — Z7982 Long term (current) use of aspirin: Secondary | ICD-10-CM | POA: Insufficient documentation

## 2019-03-01 DIAGNOSIS — Y92002 Bathroom of unspecified non-institutional (private) residence single-family (private) house as the place of occurrence of the external cause: Secondary | ICD-10-CM | POA: Diagnosis not present

## 2019-03-01 DIAGNOSIS — Y93E1 Activity, personal bathing and showering: Secondary | ICD-10-CM | POA: Insufficient documentation

## 2019-03-01 DIAGNOSIS — S40012A Contusion of left shoulder, initial encounter: Secondary | ICD-10-CM

## 2019-03-01 DIAGNOSIS — Y999 Unspecified external cause status: Secondary | ICD-10-CM | POA: Diagnosis not present

## 2019-03-01 DIAGNOSIS — W19XXXA Unspecified fall, initial encounter: Secondary | ICD-10-CM

## 2019-03-01 DIAGNOSIS — S0990XA Unspecified injury of head, initial encounter: Secondary | ICD-10-CM | POA: Insufficient documentation

## 2019-03-01 DIAGNOSIS — J449 Chronic obstructive pulmonary disease, unspecified: Secondary | ICD-10-CM | POA: Diagnosis not present

## 2019-03-01 DIAGNOSIS — W010XXA Fall on same level from slipping, tripping and stumbling without subsequent striking against object, initial encounter: Secondary | ICD-10-CM | POA: Insufficient documentation

## 2019-03-01 DIAGNOSIS — Z79899 Other long term (current) drug therapy: Secondary | ICD-10-CM | POA: Diagnosis not present

## 2019-03-01 DIAGNOSIS — S8992XA Unspecified injury of left lower leg, initial encounter: Secondary | ICD-10-CM | POA: Diagnosis not present

## 2019-03-01 DIAGNOSIS — Z91040 Latex allergy status: Secondary | ICD-10-CM | POA: Diagnosis not present

## 2019-03-01 DIAGNOSIS — S4992XA Unspecified injury of left shoulder and upper arm, initial encounter: Secondary | ICD-10-CM | POA: Diagnosis not present

## 2019-03-01 NOTE — Discharge Instructions (Signed)
Return if any problems.

## 2019-03-01 NOTE — ED Provider Notes (Signed)
[facility] EMERGENCY DEPARTMENT Provider Note   CSN: [id number] Arrival date & time: 03/01/19  1435     History   Chief Complaint Chief Complaint  Patient presents with  . Fall    HPI Martha Espinoza is a 53 y.o. female.     The history is provided by the patient. No language interpreter was used.  Fall This is a new problem. The current episode started 1 to 2 hours ago. The problem occurs constantly. The problem has not changed since onset.Nothing aggravates the symptoms. Nothing relieves the symptoms. She has tried nothing for the symptoms. The treatment provided no relief.  Pt reports she fell in the tub.  Pt is here with her caregiver.  She reports her leg got stuck and he could not hold her weight.   Past Medical History:  Diagnosis Date  . Abnormal CT of thoracic spine 09/12/2018   Noncontrast.  Increased in number and TINY increase in size of tiny, patchy sclerotic T spine lesions compared to 2018 CT.  Hem/onc summer 2020->w/u->suspected benign bone islands->survellance imaging in 6-12 mo planned.  . Allergic rhinitis   . Arthritis   . Asthma   . Bipolar 1 disorder ([place])   . Chronic pain syndrome    chronic low back pain (Pain mgmt= Dr. [name]).  MRI showed no signif disc dz, only showed some facet arthropathy at L4-5, with joint diastasis at L4-5--plan as of 11/07/16 neurosurg eval is bilat facet injections.  . Colitis due to Clostridium difficile 2001  . COPD (chronic obstructive pulmonary disease) ([place])    PFTs 04/2016 showed no sign of copd or asthma, plus she actually had worse FEV1 after taking albuterol.  However, as of 06/2018 she has recent hx of improvement in sx's after taking albuterol.  LONG TIME SMOKER, won't quit.  Intolerant to or cannot afford: pulmicort, QVAR, advair, symbicort, flovent, bevespi, and spiriva, anoro,stiolto, and trelegy. Pulmicort trial per pulm 12/2018  . Depression   . GAD (generalized anxiety disorder)   . GERD (gastroesophageal  reflux disease)   . Hepatic hemangioma   . Hepatic steatosis 06/2016   Noted incidentally on CT chest  . History of Salmonella gastroenteritis   . Hyperlipidemia    a. Noted 02/2014.  Marland Kitchen Hypertension   . IBS (irritable bowel syndrome)   . Iron deficiency anemia 05/2014   Per pt it is not due to vaginal blood loss; hemoccults sent to pt in mail 413/16.  . Lumbar spondylosis    Mild, mainly focused at L4-5 and L5-S1.  MRI L spine 09/09/17-->mild degenerative disc disease and facet arthrosis without any nerve root encroachment, no spinal stenosis.  . Microscopic hematuria    Intermittent (no w/u done yet, as of 03/2014)  . Migraine headache   . Morbid obesity ([place])   . Multiple lipomas 09/2016   Left upper arm--very small ones.  . Nocturnal hypoxia 09/18/2017   Overnight oximetry -->qualified: ordered 2L oxygen during sleep.  . Obesity hypoventilation syndrome ([place])   . Pain in both lower legs 2018-2019   Pain and numbness bilat LLs.  NCS/EMGs findings suggestive of S1 radiculopathy bilat: sx's + NCS findings suggestive of neurogenic claudication/spinal stenosis.  ? contribution of venous reflux?  . Peripheral edema Fall 2018   R>L, non-pitting for the most part--venous doppler neg for DVT 01/2017.  Saw Dr. Bridgett Larsson (Vasc) 04/12/17 and u/s showed some venous reflux dz but he felt her sx's in legs were NOT due to her venous insufficiency OR  PAD.  Thigh high comp stockings rx'd.  . Polycystic ovarian syndrome   . Pruritic dermatitis 2015/2016   +Scabies prep at [facility] 07/2014. (Primarily pruritic skin, but eventually a subtle rash as well)  . PTSD (post-traumatic stress disorder)   . Raynaud's phenomenon 2019  . Rectal cancer ([place])    a. Followed by Dr. Gala Romney, dx 2000-2001.  Surveillance colonoscopy planned as of 11/2018 GI f/u.  . Right shoulder pain 2017   Murphy/Wainer: RC bursitis + RC tear (MRI)--injection trial is plan per pt report 05/04/16.  . Vitamin D deficiency   .  Wheezing    Most of her resp symptoms are c/w upper airway etiology (repeated PFTs ->no obstructive dz). Pulm referred her to ENT 12/2018.    Patient Active Problem List   Diagnosis Date Noted  . Depression with anxiety 01/30/2019  . PTSD (post-traumatic stress disorder) 01/30/2019  . Bone lesion 09/25/2018  . Nocturnal hypoxia 10/04/2017  . Leg pain, bilateral 07/15/2017  . Chronic venous insufficiency 04/12/2017  . Acute respiratory failure with hypoxia ([place]) 10/18/2015  . COPD exacerbation ([place]) 10/18/2015  . Drug reaction 07/23/2015  . Fever of unknown origin 07/22/2015  . Lactic acidosis 07/22/2015  . Hypokalemia 07/22/2015  . Nausea and vomiting 07/22/2015  . Chronic back pain 07/22/2015  . Abdominal pain 07/26/2014  . Nausea without vomiting 07/26/2014  . History of colon cancer 07/26/2014  . Iron deficiency anemia 07/07/2014  . Vitamin D deficiency 07/07/2014  . Abnormal nuclear stress test 03/31/2014  . Abnormal cardiac CT angiography 03/31/2014  . Hyperlipidemia 03/24/2014  . Morbid obesity ([place])   . Chest pain of uncertain etiology [id number]  . COPD (chronic obstructive pulmonary disease) ([place]) 03/23/2014  . Leukocytosis 03/23/2014  . Hyperglycemia 03/23/2014  . Bipolar 1 disorder ([place]) 03/23/2014  . Pruritic rash 03/23/2014  . Chronic anxiety 03/23/2014  . Tobacco abuse 03/23/2014  . Intractable migraine without aura and with status migrainosus 01/14/2014  . Sinusitis 07/02/2013  . Urinary frequency 07/02/2013  . Migraine 07/02/2013  . Anxiety state, unspecified 04/14/2013  . Obesity 04/14/2013  . Anemia 04/14/2013  . CONSTIPATION 10/12/2009  . CARCINOMA IN SITU OF RECTUM 08/25/2009  . GERD 08/25/2009  . IRRITABLE BOWEL SYNDROME 08/25/2009  . DIARRHEA, CHRONIC 08/25/2009  . HEMANGIOMA, HEPATIC 08/24/2009  . POLYCYSTIC OVARIAN DISEASE 08/24/2009  . Personal history of other diseases of digestive system 08/24/2009    Past Surgical History:  Procedure  Laterality Date  . ABI's complete Bilateral 09/30/2017   Normal ABIs.  Great toe pressures adequate for wound healing.  Femoral artery waveforms triphasic (normal).  . CARDIOVASCULAR STRESS TEST  03/24/14   Lexscan MIBI: mild anterior ischemia?  Cardiac CT angiogram recommended/done.  . CHOLECYSTECTOMY    . COLONOSCOPY  05/05/2007   adenoma  . coronary CT angio  03/29/14   Two vessel dz/moderate stenosis of mid LAD and proximal RCA; cath recommended.  Marland Kitchen DILATION AND CURETTAGE OF UTERUS     for vaginal bleeding  . JOINT REPLACEMENT    . LEFT HEART CATHETERIZATION WITH CORONARY ANGIOGRAM N/A 03/31/2014   Normal coronaries, EF 0000000, diastolic dysfunction.  Procedure: LEFT HEART CATHETERIZATION WITH CORONARY ANGIOGRAM;  Surgeon: Sinclair Grooms, MD;  Location: [facility] CATH LAB;  Service: Cardiovascular;  Laterality: N/A;  . LOWER EXT VENOUS DOPPLERS Bilateral 02/08/2017   NEG for DVT (bilat)  . NCS/EMG  10/03/2017   S1 radiculopathy bilat  . Overnight oximetry testing  09/2017   qualified for oxygen  supplementation during sleep  . PFTs  04/2016   No sign of COPD or asthma; FEV 1 worse after albut.  . rectal mss  age 28   High-grade rectal adenoma removed from rectum   . TRANSTHORACIC ECHOCARDIOGRAM  03/23/14; 10/21/17   2015 Normal.  2019: mild LVH, EF 65-70%, study technically inadequate to assess diastolic function, valves were fine, wall motion normal.     OB History    Gravida  4   Para  1   Term  1   Preterm      AB  3   Living  1     SAB  3   TAB      Ectopic      Multiple      Live Births               Home Medications    Prior to Admission medications   Medication Sig Start Date End Date Taking? Authorizing Provider  albuterol (PROVENTIL) (2.5 MG/3ML) 0.083% nebulizer solution Take 3 mLs (2.5 mg total) by nebulization every 6 (six) hours as needed for wheezing or shortness of breath. 08/27/18 08/27/19  McGowen, Adrian Blackwater, MD  albuterol (VENTOLIN HFA) 108 (90  Base) MCG/ACT inhaler INHALE 1 TO 2 PUFFS BY MOUTH EVERY 4 HOURS AS NEEDED Patient taking differently: Inhale 1-2 puffs into the lungs 4 (four) times daily.  12/31/18   Mannam, Hart Robinsons, MD  AMITIZA 24 MCG capsule Take 24 mcg by mouth 2 (two) times daily. 01/01/18   [provider]  aspirin 81 MG chewable tablet Chew 1 tablet (81 mg total) by mouth daily. Patient taking differently: Chew 81 mg by mouth every evening.  09/06/12   Ripley Fraise, MD  azelastine (ASTELIN) 0.1 % nasal spray Place 2 sprays into both nostrils 2 (two) times daily. 07/26/17   McGowen, Adrian Blackwater, MD  budesonide (PULMICORT) 0.5 MG/2ML nebulizer solution Take 2 mLs by nebulization 2 (two) times daily. 01/15/19   [provider]  busPIRone (BUSPAR) 15 MG tablet Take 30 mg by mouth See admin instructions. TAKE 2 TABLETS (30 MG) BY MOUTH AT BEDTIME.    [provider]  clonazePAM (KLONOPIN) 0.5 MG tablet TAKE 1 TABLET BY MOUTH DURING THE DAY AS NEEDED AND 2 TABLETS AT BEDTIME Patient taking differently: Take 0.5-1 mg by mouth See admin instructions. TAKE 1 TABLET (0.5 MG) BY MOUTH DAILY IN THE MORNING & TAKE 2 TABLETS (1 MG) BY MOUTH AT BEDTIME. 10/27/18   McGowen, Adrian Blackwater, MD  cyclobenzaprine (FLEXERIL) 10 MG tablet TAKE ONE TABLET BY MOUTH EVERY 6 HOURS AS NEEDED (SPASM RELATED TO HEADACHES) Patient taking differently: Take 10 mg by mouth 3 (three) times daily.  07/10/16   McGowen, Adrian Blackwater, MD  diphenhydrAMINE (BENADRYL) 25 MG tablet Take 1 tablet (25 mg total) by mouth every 6 (six) hours. Patient taking differently: Take 25 mg by mouth every 6 (six) hours as needed (allergic reactions).  07/19/15   Fredia Sorrow, MD  EQ ALLERGY RELIEF, CETIRIZINE, 10 MG tablet Take 1 tablet by mouth once daily Patient taking differently: Take 10 mg by mouth at bedtime.  12/29/18   McGowen, Adrian Blackwater, MD  ferrous sulfate 325 (65 FE) MG EC tablet Take 325 mg by mouth every evening.    [provider]  fluticasone  (FLONASE) 50 MCG/ACT nasal spray Place 2 sprays into both nostrils daily as needed for allergies.    [provider]  formoterol (PERFOROMIST) 20 MCG/2ML nebulizer solution  Take 2 mLs (20 mcg total) by nebulization 2 (two) times daily. 12/31/18   Mannam, Hart Robinsons, MD  lamoTRIgine (LAMICTAL) 100 MG tablet Take 200 mg by mouth at bedtime. 03/06/18   [provider]  losartan (COZAAR) 50 MG tablet Take 1 tablet by mouth once daily Patient taking differently: Take 50 mg by mouth every evening.  12/29/18   McGowen, Adrian Blackwater, MD  meloxicam (MOBIC) 15 MG tablet Take 15 mg by mouth every evening.  01/01/18   [provider]  metoprolol tartrate (LOPRESSOR) 50 MG tablet Take 1 tablet by mouth twice daily Patient taking differently: Take 50 mg by mouth 2 (two) times daily.  12/29/18   McGowen, Adrian Blackwater, MD  Naldemedine Tosylate (SYMPROIC) 0.2 MG TABS Take 0.2 mg by mouth at bedtime.     [provider]  nitroGLYCERIN (NITROSTAT) 0.4 MG SL tablet DISSOLVE ONE TABLET UNDER THE TONGUE EVERY 5 MINUTES AS NEEDED FOR CHEST PAIN.  DO NOT EXCEED A TOTAL OF 3 DOSES IN 15 MINUTES Patient taking differently: Place 0.4 mg under the tongue every 5 (five) minutes x 3 doses as needed for chest pain.  11/13/18   McGowen, Adrian Blackwater, MD  oxyCODONE-acetaminophen (PERCOCET) 10-325 MG tablet Take 1 tablet by mouth 4 (four) times daily.     [provider]  pantoprazole (PROTONIX) 40 MG tablet  01/28/19   [provider]  penicillin v potassium (VEETID) 500 MG tablet Take 1 tablet (500 mg total) by mouth 4 (four) times daily. 02/09/19   McGowen, Adrian Blackwater, MD  Potassium (POTASSIMIN PO) Take 2 tablets by mouth at bedtime.    [provider]  pregabalin (LYRICA) 200 MG capsule  01/28/19   [provider]  promethazine (PHENERGAN) 25 MG tablet 1 tab po q6h prn nausea Patient taking differently: Take 25 mg by mouth every 6 (six) hours as needed (nausea).  11/05/18   McGowen,  Adrian Blackwater, MD  rOPINIRole (REQUIP) 0.25 MG tablet TAKE 3 TABLETS BY MOUTH AT BEDTIME Patient taking differently: Take 0.75 mg by mouth at bedtime.  12/29/18   McGowen, Adrian Blackwater, MD  rosuvastatin (CRESTOR) 10 MG tablet Take 1 tablet (10 mg total) by mouth daily. 02/10/19   McGowen, Adrian Blackwater, MD  sertraline (ZOLOFT) 50 MG tablet Take 1 tablet (50 mg total) by mouth daily. 01/30/19   [place] Crane, MD  traZODone (DESYREL) 150 MG tablet Take 1 tablet (150 mg total) by mouth at bedtime. 01/30/19   [place] Crane, MD  varenicline (CHANTIX CONTINUING MONTH PAK) 1 MG tablet Take 1 tablet (1 mg total) by mouth 2 (two) times daily. 02/18/19   McGowen, Adrian Blackwater, MD  varenicline (CHANTIX STARTING MONTH PAK) 0.5 MG X 11 & 1 MG X 42 tablet Take one 0.5 mg tablet by mouth once daily for 3 days, then increase to one 0.5 mg tablet twice daily for 4 days, then increase to one 1 mg tablet twice daily. 02/18/19   McGowen, Adrian Blackwater, MD  citalopram (CELEXA) 40 MG tablet Take 20 mg by mouth at bedtime.   06/18/11  [provider]  omeprazole (PRILOSEC) 20 MG capsule Take 40 mg by mouth daily.  01/26/19  [provider]    Family History Family History  Problem Relation Age of Onset  . Colon cancer Father 28  . Cancer Father   . Heart disease Mother   . Hypertension Mother   . Hyperlipidemia Mother   . Mental illness Mother   . Diabetes  Mother   . Heart attack Mother   . Thyroid disease Mother   . Anxiety disorder Mother   . Depression Mother   . Colon cancer Maternal Grandfather   . Bipolar disorder Son   . Stroke Neg Hx     Social History Social History   Tobacco Use  . Smoking status: Current Every Day Smoker    Packs/day: 1.00    Years: 18.00    Pack years: 18.00    Types: Cigarettes  . Smokeless tobacco: Never Used  . Tobacco comment: 25 years  Substance Use Topics  . Alcohol use: No    Alcohol/week: 0.0 standard drinks  . Drug use: No     Allergies   Contrast media  [iodinated diagnostic agents], Shellfish allergy, Codeine, Furosemide, Anoro ellipta [umeclidinium-vilanterol], Flovent diskus [fluticasone propionate (inhal)], Ivermectin, Spiriva handihaler [tiotropium bromide monohydrate], Symbicort [budesonide-formoterol fumarate], Betadine [povidone iodine], Latex, Lipitor [atorvastatin], Permethrin, Stiolto respimat [tiotropium bromide-olodaterol], Tape, and Trelegy ellipta [fluticasone-umeclidin-vilant]   Review of Systems Review of Systems  All other systems reviewed and are negative.    Physical Exam Updated Vital Signs BP (!) 98/43   Pulse 73   Temp 97.8 F (36.6 C) (Oral)   Resp 19   Ht 5' (1.524 m)   Wt 119.7 kg   LMP 02/10/2019   SpO2 0000000 Comment: acrilic nails  BMI [id number] kg/m   Physical Exam Vitals signs and nursing note reviewed.  Constitutional:      Appearance: She is well-developed.  HENT:     Head: Normocephalic.     Nose: Nose normal.     Mouth/Throat:     Mouth: Mucous membranes are moist.  Neck:     Musculoskeletal: Normal range of motion.  Cardiovascular:     Rate and Rhythm: Normal rate.  Pulmonary:     Effort: Pulmonary effort is normal.  Abdominal:     General: There is no distension.  Musculoskeletal:     Comments: Tender left shoulder and tender left knee,  Pain with movement.    Skin:    General: Skin is warm.  Neurological:     General: No focal deficit present.     Mental Status: She is alert and oriented to person, place, and time.  Psychiatric:        Mood and Affect: Mood normal.      ED Treatments / Results  Labs (all labs ordered are listed, but only abnormal results are displayed) Labs Reviewed - No data to display  EKG None  Radiology Ct Head Wo Contrast  Result Date: 03/01/2019 CLINICAL DATA:  Head trauma, fall. EXAM: CT HEAD WITHOUT CONTRAST TECHNIQUE: Contiguous axial images were obtained from the base of the skull through the vertex without intravenous contrast. COMPARISON:   12/09/2015 FINDINGS: Brain: No evidence of acute infarction, hemorrhage, hydrocephalus, extra-axial collection or mass lesion/mass effect. Vascular: No hyperdense vessel or unexpected calcification. Skull: Normal. Negative for fracture or focal lesion. Sinuses/Orbits: No acute finding. Other: None. IMPRESSION: No acute intracranial abnormality. Electronically Signed   By: Zetta Bills M.D.   On: 03/01/2019 16:54   Dg Shoulder Left  Result Date: 03/01/2019 CLINICAL DATA:  Status post fall. EXAM: LEFT SHOULDER - 2+ VIEW COMPARISON:  None FINDINGS: There is no dislocation. No fracture. No radio-opaque foreign body or soft tissue calcifications. IMPRESSION: Normal exam. Electronically Signed   By: Kerby Moors M.D.   On: 03/01/2019 16:37   Dg Knee Complete 4 Views Left  Result Date: 03/01/2019 CLINICAL DATA:  Status post fall. EXAM: LEFT KNEE - COMPLETE 4+ VIEW COMPARISON:  None FINDINGS: No joint effusion identified. No fractures or dislocations identified. No radio-opaque foreign body or soft tissue calcification identified. Mild tricompartment osteoarthritis noted. IMPRESSION: 1. No acute findings. 2. Mild osteoarthritis. Electronically Signed   By: Kerby Moors M.D.   On: 03/01/2019 16:36    Procedures Procedures (including critical care time)  Medications Ordered in ED Medications - No data to display   Initial Impression / Assessment and Plan / ED Course  I have reviewed the triage vital signs and the nursing notes.  Pertinent labs & imaging results that were available during my care of the patient were reviewed by me and considered in my medical decision making (see chart for details).        MDM  Pt seems sedated.  Pt's care giver reports pt's medications make her sleepy and that this is normal for her.   Final Clinical Impressions(s) / ED Diagnoses   Final diagnoses:  Fall, initial encounter  Contusion of left shoulder, initial encounter  Contusion of left knee, initial  encounter    ED Discharge Orders    None    An After Visit Summary was printed and given to the patient.    Fransico Meadow, Hershal Coria 03/01/19 2152    Fredia Sorrow, MD 03/01/19 [phone]

## 2019-03-01 NOTE — ED Triage Notes (Signed)
Pt reports has problems with her legs "getting tired."  Reports has fallen multiple times.  Today pt wasn't able to get her leg over her tub and fell.  C/O pain to left knee and left shoulder.

## 2019-03-02 ENCOUNTER — Ambulatory Visit (INDEPENDENT_AMBULATORY_CARE_PROVIDER_SITE_OTHER): Payer: Commercial Managed Care - PPO | Admitting: Pulmonary Disease

## 2019-03-02 ENCOUNTER — Encounter: Payer: Self-pay | Admitting: Pulmonary Disease

## 2019-03-02 DIAGNOSIS — J449 Chronic obstructive pulmonary disease, unspecified: Secondary | ICD-10-CM | POA: Diagnosis not present

## 2019-03-02 NOTE — Patient Instructions (Signed)
Glad your breathing is stable Continue the nebulizers as prescribed Continue smoking cessation with Chantix and nicotine patches Follow-up in 6 months.

## 2019-03-02 NOTE — Progress Notes (Signed)
Virtual Visit via Telephone Note  I connected with Martha Espinoza on 03/02/19 at  1:30 PM EST by telephone and verified that I am speaking with the correct person using two identifiers.  Location: Patient: Home Provider: [facility], [place], [place]   I discussed the limitations, risks, security and privacy concerns of performing an evaluation and management service by telephone and the availability of in person appointments. I also discussed with the patient that there may be a patient responsible charge related to this service. The patient expressed understanding and agreed to proceed.  History of Present Illness: Follow-up for asthma, COPD  53 year old heavy smoker with history of COPD, asthma, multiple psychiatric issues including bipolar disorder, multiple personality disorder, depression, suicidal ideation   Observations/Objective: She has not tolerated multiple inhalers.  Continues on chronic prednisone at 5 mg. She has started on Perforomist and Pulmicort at last visit.  States that breathing is stable Refered to pharmacy for inhaler check and training but patient canceled appointment She has seen ENT, Dr. Wellington Hampshire for hoarseness and found to have reinke's edema.  Advised antiacid therapy and smoking cessation  Assessment and Plan: COPD, asthma, smoker Continue Perforomist, Pulmicort Smoking cessation.  She is using Chantix and nicotine patches to help quit cigarettes  GERD Continue Protonix  Follow Up Instructions: Follow-up in 6 months.   I discussed the assessment and treatment plan with the patient. The patient was provided an opportunity to ask questions and all were answered. The patient agreed with the plan and demonstrated an understanding of the instructions.   The patient was advised to call back or seek an in-person evaluation if the symptoms worsen or if the condition fails to improve as anticipated.  I provided 25 minutes of  non-face-to-face time during this encounter.   Marshell Garfinkel MD [facility] 03/02/2019, 1:43 PM

## 2019-03-06 ENCOUNTER — Encounter: Payer: Self-pay | Admitting: Family Medicine

## 2019-03-09 ENCOUNTER — Other Ambulatory Visit: Payer: Self-pay

## 2019-03-09 ENCOUNTER — Ambulatory Visit (INDEPENDENT_AMBULATORY_CARE_PROVIDER_SITE_OTHER): Payer: Commercial Managed Care - PPO | Admitting: Psychiatry

## 2019-03-09 ENCOUNTER — Encounter: Payer: Self-pay | Admitting: Psychiatry

## 2019-03-09 DIAGNOSIS — F418 Other specified anxiety disorders: Secondary | ICD-10-CM

## 2019-03-09 DIAGNOSIS — F4310 Post-traumatic stress disorder, unspecified: Secondary | ICD-10-CM

## 2019-03-09 MED ORDER — SERTRALINE HCL 50 MG PO TABS: 50.0000 mg | ORAL_TABLET | Freq: Every day | ORAL | 0 refills | Status: DC

## 2019-03-09 MED ORDER — BUSPIRONE HCL 15 MG PO TABS: 15.0000 mg | ORAL_TABLET | Freq: Two times a day (BID) | ORAL | 0 refills | Status: DC

## 2019-03-09 MED ORDER — LAMOTRIGINE 100 MG PO TABS: 200.0000 mg | ORAL_TABLET | Freq: Every day | ORAL | 0 refills | Status: DC

## 2019-03-09 MED ORDER — TRAZODONE HCL 150 MG PO TABS: ORAL_TABLET | ORAL | 0 refills | Status: DC

## 2019-03-09 NOTE — Progress Notes (Signed)
[facility] MD OP Progress Note  I connected with  Martha Espinoza on 03/09/19 by a video enabled telemedicine application and verified that I am speaking with the correct person using two identifiers.   I discussed the limitations of evaluation and management by telemedicine. The patient expressed understanding and agreed to proceed.    03/09/2019 11:17 AM Martha Espinoza  MRN:  [id number]  Chief Complaint: "I am doing fine."  HPI: Patient reported doing well overall.  She stated that her mood has been stable.  She denied any adverse side effects with Zoloft.  She did report taking only half tablet of trazodone.  As whole tablet makes her very groggy the next day.  She reported that her son and her nephew moved in with her and she is not sure if that is helping or not but overall she feels fine.  She informed that she fell in the bathroom and needs a lot of help with support to get out of the bathtub.  She denied any suicidal ideations or panic attacks.  She feels she is doing well on the current combination for now.  She has started seeing Ms. Janett Billow for therapy and has an appointment coming up in a few days.  Visit Diagnosis:    ICD-10-CM   1. Depression with anxiety  F41.8 sertraline (ZOLOFT) 50 MG tablet    traZODone (DESYREL) 150 MG tablet    lamoTRIgine (LAMICTAL) 100 MG tablet    busPIRone (BUSPAR) 15 MG tablet  2. PTSD (post-traumatic stress disorder)  F43.10 sertraline (ZOLOFT) 50 MG tablet    busPIRone (BUSPAR) 15 MG tablet    Past Psychiatric History: Depression, anxiety, PTSD  Past Medical History:  Past Medical History:  Diagnosis Date  . Abnormal CT of thoracic spine 09/12/2018   Noncontrast.  Increased in number and TINY increase in size of tiny, patchy sclerotic T spine lesions compared to 2018 CT.  Hem/onc summer 2020->w/u->suspected benign bone islands->survellance imaging in 6-12 mo planned.  . Allergic rhinitis   . Arthritis   . Asthma   . Bipolar 1 disorder ([place])   .  Chronic pain syndrome    chronic low back pain (Pain mgmt= Dr. [name]).  MRI showed no signif disc dz, only showed some facet arthropathy at L4-5, with joint diastasis at L4-5--plan as of 11/07/16 neurosurg eval is bilat facet injections.  . Colitis due to Clostridium difficile 2001  . COPD (chronic obstructive pulmonary disease) ([place])    PFTs 04/2016 showed no sign of copd or asthma, plus she actually had worse FEV1 after taking albuterol.  However, as of 06/2018 she has recent hx of improvement in sx's after taking albuterol.  LONG TIME SMOKER, won't quit.  Intolerant to or cannot afford: pulmicort, QVAR, advair, symbicort, flovent, bevespi, and spiriva, anoro,stiolto, and trelegy. Pulmicort trial per pulm 12/2018  . Depression   . GAD (generalized anxiety disorder)   . GERD (gastroesophageal reflux disease)   . Hepatic hemangioma   . Hepatic steatosis 06/2016   Noted incidentally on CT chest  . History of Salmonella gastroenteritis   . Hoarseness    chronic: laryngoscopy showed edema of vocal folds and R focal fold leukoplakia->to be bx'd->NEEDS TO QUIT SMOKING!  Marland Kitchen Hyperlipidemia    a. Noted 02/2014.  Marland Kitchen Hypertension   . IBS (irritable bowel syndrome)   . Iron deficiency anemia 05/2014   Per pt it is not due to vaginal blood loss; hemoccults sent to pt in mail 413/16.  . Lumbar  spondylosis    Mild, mainly focused at L4-5 and L5-S1.  MRI L spine 09/09/17-->mild degenerative disc disease and facet arthrosis without any nerve root encroachment, no spinal stenosis.  . Microscopic hematuria    Intermittent (no w/u done yet, as of 03/2014)  . Migraine headache   . Morbid obesity ([place])   . Multiple lipomas 09/2016   Left upper arm--very small ones.  . Nocturnal hypoxia 09/18/2017   Overnight oximetry -->qualified: ordered 2L oxygen during sleep.  . Obesity hypoventilation syndrome ([place])   . Pain in both lower legs 2018-2019   Pain and numbness bilat LLs.  NCS/EMGs findings suggestive of S1  radiculopathy bilat: sx's + NCS findings suggestive of neurogenic claudication/spinal stenosis.  ? contribution of venous reflux?  . Peripheral edema Fall 2018   R>L, non-pitting for the most part--venous doppler neg for DVT 01/2017.  Saw Dr. Bridgett Larsson (Vasc) 04/12/17 and u/s showed some venous reflux dz but he felt her sx's in legs were NOT due to her venous insufficiency OR PAD.  Thigh high comp stockings rx'd.  . Polycystic ovarian syndrome   . Pruritic dermatitis 2015/2016   +Scabies prep at [facility] 07/2014. (Primarily pruritic skin, but eventually a subtle rash as well)  . PTSD (post-traumatic stress disorder)   . Raynaud's phenomenon 2019  . Rectal cancer ([place])    a. Followed by Dr. Gala Romney, dx 2000-2001.  Surveillance colonoscopy planned as of 11/2018 GI f/u.  . Right shoulder pain 2017   Murphy/Wainer: RC bursitis + RC tear (MRI)--injection trial is plan per pt report 05/04/16.  . Vitamin D deficiency   . Wheezing    Most of her resp symptoms are c/w upper airway etiology (repeated PFTs ->no obstructive dz). Pulm referred her to ENT 12/2018.    Past Surgical History:  Procedure Laterality Date  . ABI's complete Bilateral 09/30/2017   Normal ABIs.  Great toe pressures adequate for wound healing.  Femoral artery waveforms triphasic (normal).  . CARDIOVASCULAR STRESS TEST  03/24/14   Lexscan MIBI: mild anterior ischemia?  Cardiac CT angiogram recommended/done.  . CHOLECYSTECTOMY    . COLONOSCOPY  05/05/2007   adenoma  . coronary CT angio  03/29/14   Two vessel dz/moderate stenosis of mid LAD and proximal RCA; cath recommended.  Marland Kitchen DILATION AND CURETTAGE OF UTERUS     for vaginal bleeding  . JOINT REPLACEMENT    . laryngoscopy  02/11/2019   (Done b/c of hoarseness and hx of wheezing"Reinke's edema of vocal folds, right vocal cord leukoplakia-->plan for bx of leukoplakia->NEEDS TO QUIT SMOKING!  . LEFT HEART CATHETERIZATION WITH CORONARY ANGIOGRAM N/A 03/31/2014   Normal coronaries, EF  0000000, diastolic dysfunction.  Procedure: LEFT HEART CATHETERIZATION WITH CORONARY ANGIOGRAM;  Surgeon: Sinclair Grooms, MD;  Location: [facility] CATH LAB;  Service: Cardiovascular;  Laterality: N/A;  . LOWER EXT VENOUS DOPPLERS Bilateral 02/08/2017   NEG for DVT (bilat)  . NCS/EMG  10/03/2017   S1 radiculopathy bilat  . Overnight oximetry testing  09/2017   qualified for oxygen supplementation during sleep  . PFTs  04/2016   No sign of COPD or asthma; FEV 1 worse after albut.  . rectal mss  age 89   High-grade rectal adenoma removed from rectum   . TRANSTHORACIC ECHOCARDIOGRAM  03/23/14; 10/21/17   2015 Normal.  2019: mild LVH, EF 65-70%, study technically inadequate to assess diastolic function, valves were fine, wall motion normal.    Family Psychiatric History: See below  Family History:  Family History  Problem Relation Age of Onset  . Colon cancer Father 34  . Cancer Father   . Heart disease Mother   . Hypertension Mother   . Hyperlipidemia Mother   . Mental illness Mother   . Diabetes Mother   . Heart attack Mother   . Thyroid disease Mother   . Anxiety disorder Mother   . Depression Mother   . Colon cancer Maternal Grandfather   . Bipolar disorder Son   . Stroke Neg Hx     Social History:  Social History   Socioeconomic History  . Marital status: Married    Spouse name: tracey  . Number of children: 1  . Years of education: Not on file  . Highest education level: 9th grade  Occupational History  . Occupation: own Games developer: [employer]  Tobacco Use  . Smoking status: Current Every Day Smoker    Packs/day: 1.00    Years: 18.00    Pack years: 18.00    Types: Cigarettes  . Smokeless tobacco: Never Used  . Tobacco comment: 25 years  Substance and Sexual Activity  . Alcohol use: No    Alcohol/week: 0.0 standard drinks  . Drug use: No  . Sexual activity: Not Currently    Birth control/protection: None, Surgical  Other Topics  Concern  . Not on file  Social History Narrative   A friend   Social Determinants of Health   Financial Resource Strain: Low Risk   . Difficulty of Paying Living Expenses: Not hard at all  Food Insecurity: No Food Insecurity  . Worried About Charity fundraiser in the Last Year: Never true  . Ran Out of Food in the Last Year: Never true  Transportation Needs: No Transportation Needs  . Lack of Transportation (Medical): No  . Lack of Transportation (Non-Medical): No  Physical Activity: Inactive  . Days of Exercise per Week: 0 days  . Minutes of Exercise per Session: 0 min  Stress: Stress Concern Present  . Feeling of Stress : Very much  Social Connections: Moderately Isolated  . Frequency of Communication with Friends and Family: Once a week  . Frequency of Social Gatherings with Friends and Family: Once a week  . Attends Religious Services: Never  . Active Member of Clubs or Organizations: No  . Attends Archivist Meetings: Never  . Marital Status: Married    Allergies:  Allergies  Allergen Reactions  . Contrast Media [Iodinated Diagnostic Agents] Anaphylaxis    Needs 13-hour prep  . Shellfish Allergy Anaphylaxis  . Codeine Nausea And Vomiting and Other (See Comments)    Hallucinations, Also sees people that are not there   . Furosemide Rash    Rash, soB.  Marland Kitchen Anoro Ellipta [Umeclidinium-Vilanterol] Other (See Comments)    Pt states it caused blisters.  . Flovent Diskus [Fluticasone Propionate (Inhal)]     headache  . Ivermectin Nausea And Vomiting    N/V and rash  . Spiriva Handihaler [Tiotropium Bromide Monohydrate] Other (See Comments)    Severe headaches  . Symbicort [Budesonide-Formoterol Fumarate]     headache  . Betadine [Povidone Iodine] Rash  . Latex Hives  . Lipitor [Atorvastatin] Itching  . Permethrin Itching  . Stiolto Respimat [Tiotropium Bromide-Olodaterol] Other (See Comments)    Mouth ulcers  . Tape Dermatitis    Paper tape and clear   . Trelegy Ellipta [Fluticasone-Umeclidin-Vilant] Other (See Comments)    headaches    Metabolic  Disorder Labs: Lab Results  Component Value Date   HGBA1C 5.8 09/24/2018   MPG 123 (H) 03/23/2014   No results found for: PROLACTIN Lab Results  Component Value Date   CHOL 226 (H) 02/06/2019   TRIG 276 (H) 02/06/2019   HDL 40 (L) 02/06/2019   CHOLHDL 5.7 (H) 02/06/2019   VLDL 64.8 (H) 12/04/2018   LDLCALC 144 (H) 02/06/2019   LDLCALC 169 (H) 03/24/2014   Lab Results  Component Value Date   TSH 1.12 10/08/2016   TSH 0.39 10/24/2015    Therapeutic Level Labs: No results found for: LITHIUM No results found for: VALPROATE No components found for:  CBMZ  Current Medications: Current Outpatient Medications  Medication Sig Dispense Refill  . albuterol (PROVENTIL) (2.5 MG/3ML) 0.083% nebulizer solution Take 3 mLs (2.5 mg total) by nebulization every 6 (six) hours as needed for wheezing or shortness of breath. 75 mL 6  . albuterol (VENTOLIN HFA) 108 (90 Base) MCG/ACT inhaler INHALE 1 TO 2 PUFFS BY MOUTH EVERY 4 HOURS AS NEEDED (Patient taking differently: Inhale 1-2 puffs into the lungs 4 (four) times daily. ) 16 g 11  . AMITIZA 24 MCG capsule Take 24 mcg by mouth 2 (two) times daily.  1  . aspirin 81 MG chewable tablet Chew 1 tablet (81 mg total) by mouth daily. (Patient taking differently: Chew 81 mg by mouth every evening. ) 14 tablet 0  . azelastine (ASTELIN) 0.1 % nasal spray Place 2 sprays into both nostrils 2 (two) times daily. 30 mL 6  . budesonide (PULMICORT) 0.5 MG/2ML nebulizer solution Take 2 mLs by nebulization 2 (two) times daily.    . busPIRone (BUSPAR) 15 MG tablet Take 1 tablet (15 mg total) by mouth 2 (two) times daily. 180 tablet 0  . clonazePAM (KLONOPIN) 0.5 MG tablet TAKE 1 TABLET BY MOUTH DURING THE DAY AS NEEDED AND 2 TABLETS AT BEDTIME (Patient taking differently: Take 0.5-1 mg by mouth See admin instructions. TAKE 1 TABLET (0.5 MG) BY MOUTH DAILY IN THE MORNING  & TAKE 2 TABLETS (1 MG) BY MOUTH AT BEDTIME.) 90 tablet 5  . cyclobenzaprine (FLEXERIL) 10 MG tablet TAKE ONE TABLET BY MOUTH EVERY 6 HOURS AS NEEDED (SPASM RELATED TO HEADACHES) (Patient taking differently: Take 10 mg by mouth 3 (three) times daily. ) 42 tablet 5  . diphenhydrAMINE (BENADRYL) 25 MG tablet Take 1 tablet (25 mg total) by mouth every 6 (six) hours. (Patient taking differently: Take 25 mg by mouth every 6 (six) hours as needed (allergic reactions). ) 20 tablet 0  . EQ ALLERGY RELIEF, CETIRIZINE, 10 MG tablet Take 1 tablet by mouth once daily (Patient taking differently: Take 10 mg by mouth at bedtime. ) 90 tablet 1  . ferrous sulfate 325 (65 FE) MG EC tablet Take 325 mg by mouth every evening.    . fluticasone (FLONASE) 50 MCG/ACT nasal spray Place 2 sprays into both nostrils daily as needed for allergies.    . formoterol (PERFOROMIST) 20 MCG/2ML nebulizer solution Take 2 mLs (20 mcg total) by nebulization 2 (two) times daily. 120 mL 11  . lamoTRIgine (LAMICTAL) 100 MG tablet Take 2 tablets (200 mg total) by mouth at bedtime. 90 tablet 0  . losartan (COZAAR) 50 MG tablet Take 1 tablet by mouth once daily (Patient taking differently: Take 50 mg by mouth every evening. ) 90 tablet 1  . meloxicam (MOBIC) 15 MG tablet Take 15 mg by mouth every evening.   1  . metoprolol  tartrate (LOPRESSOR) 50 MG tablet Take 1 tablet by mouth twice daily (Patient taking differently: Take 50 mg by mouth 2 (two) times daily. ) 180 tablet 1  . Naldemedine Tosylate (SYMPROIC) 0.2 MG TABS Take 0.2 mg by mouth at bedtime.     . nitroGLYCERIN (NITROSTAT) 0.4 MG SL tablet DISSOLVE ONE TABLET UNDER THE TONGUE EVERY 5 MINUTES AS NEEDED FOR CHEST PAIN.  DO NOT EXCEED A TOTAL OF 3 DOSES IN 15 MINUTES (Patient taking differently: Place 0.4 mg under the tongue every 5 (five) minutes x 3 doses as needed for chest pain. ) 10 tablet 0  . oxyCODONE-acetaminophen (PERCOCET) 10-325 MG tablet Take 1 tablet by mouth 4 (four) times  daily.     . pantoprazole (PROTONIX) 40 MG tablet     . penicillin v potassium (VEETID) 500 MG tablet Take 1 tablet (500 mg total) by mouth 4 (four) times daily. 40 tablet 0  . Potassium (POTASSIMIN PO) Take 2 tablets by mouth at bedtime.    . pregabalin (LYRICA) 200 MG capsule     . promethazine (PHENERGAN) 25 MG tablet 1 tab po q6h prn nausea (Patient taking differently: Take 25 mg by mouth every 6 (six) hours as needed (nausea). ) 30 tablet 3  . rOPINIRole (REQUIP) 0.25 MG tablet TAKE 3 TABLETS BY MOUTH AT BEDTIME (Patient taking differently: Take 0.75 mg by mouth at bedtime. ) 270 tablet 0  . rosuvastatin (CRESTOR) 10 MG tablet Take 1 tablet (10 mg total) by mouth daily. 30 tablet 2  . sertraline (ZOLOFT) 50 MG tablet Take 1 tablet (50 mg total) by mouth daily. 90 tablet 0  . traZODone (DESYREL) 150 MG tablet Take half tablet as needed for sleep 45 tablet 0  . varenicline (CHANTIX CONTINUING MONTH PAK) 1 MG tablet Take 1 tablet (1 mg total) by mouth 2 (two) times daily. 60 tablet 1  . varenicline (CHANTIX STARTING MONTH PAK) 0.5 MG X 11 & 1 MG X 42 tablet Take one 0.5 mg tablet by mouth once daily for 3 days, then increase to one 0.5 mg tablet twice daily for 4 days, then increase to one 1 mg tablet twice daily. 53 tablet 0   No current facility-administered medications for this visit.     Musculoskeletal: Strength & Muscle Tone: unable to assess due to telemed visit Gait & Station: unable to assess due to telemed visit Patient leans: unable to assess due to telemed visit  Psychiatric Specialty Exam: Review of Systems  Last menstrual period 02/10/2019.There is no height or weight on file to calculate BMI.  General Appearance: Fairly Groomed  Eye Contact:  Good  Speech:  Clear and Coherent and Normal Rate  Volume:  Normal  Mood:  Euthymic  Affect:  Appropriate  Thought Process:  Goal Directed, Linear and Descriptions of Associations: Intact  Orientation:  Full (Time, Place, and  Person)  Thought Content: Logical   Suicidal Thoughts:  No  Homicidal Thoughts:  No  Memory:  Recent;   Good Remote;   Good  Judgement:  Fair  Insight:  Fair  Psychomotor Activity:  Normal  Concentration:  Concentration: Good and Attention Span: Good  Recall:  Good  Fund of Knowledge: Fair  Language: Good  Akathisia:  Negative  Handed:  Right  AIMS (if indicated): not done   Assets:  Communication Skills Desire for Improvement Financial Resources/Insurance Housing Social Support  ADL's:  Intact  Cognition: WNL  Sleep:  Good   Screenings: PHQ2-9  Office Visit from 08/16/2017 in [place] Office Visit from 07/17/2017 in [place]  PHQ-2 Total Score  2  6  PHQ-9 Total Score  18  18       Assessment and Plan: Patient appears to be doing well on her current medication regimen.  Has started seeing a therapist recently.  1. PTSD (post-traumatic stress disorder)  - sertraline (ZOLOFT) 50 MG tablet; Take 1 tablet (50 mg total) by mouth daily.  Dispense: 90 tablet; Refill: 0 - busPIRone (BUSPAR) 15 MG tablet; Take 1 tablet (15 mg total) by mouth 2 (two) times daily.  Dispense: 180 tablet; Refill: 0  2. Depression with anxiety  - sertraline (ZOLOFT) 50 MG tablet; Take 1 tablet (50 mg total) by mouth daily.  Dispense: 90 tablet; Refill: 0 - traZODone (DESYREL) 150 MG tablet; Take half tablet as needed for sleep  Dispense: 45 tablet; Refill: 0 - lamoTRIgine (LAMICTAL) 100 MG tablet; Take 2 tablets (200 mg total) by mouth at bedtime.  Dispense: 90 tablet; Refill: 0 - busPIRone (BUSPAR) 15 MG tablet; Take 1 tablet (15 mg total) by mouth 2 (two) times daily.  Dispense: 180 tablet; Refill: 0  Continue same regimen. Continue individual therapy. Follow-up in 2 months.  [place] Crane, MD 03/09/2019, 11:17 AM

## 2019-03-11 ENCOUNTER — Encounter (HOSPITAL_COMMUNITY): Payer: Self-pay | Admitting: Physical Therapy

## 2019-03-11 NOTE — Therapy (Unsigned)
[place], [place], [address] Phone: [phone]   Fax:  [phone]  Patient Details  Name: Martha Espinoza MRN: [id number] Date of Birth: June 16, 1965 Referring Provider:  No ref. provider found  Encounter Date: 03/11/2019   PHYSICAL THERAPY DISCHARGE SUMMARY  Visits from Start of Care: 7  Current functional level related to goals / functional outcomes: Unknown as pt did not return for reassessment   Remaining deficits: Back pain    Education / Equipment: HEP  Plan: Patient agrees to discharge.  Patient goals were not met. Patient is being discharged due to not returning since the last visit.  ?????    Rayetta Humphrey, PT CLT [place], PT CLT [phone] 03/11/2019, 11:55 AM  [place], [place], [address] Phone: [phone]   Fax:  [phone]

## 2019-03-16 ENCOUNTER — Encounter (HOSPITAL_COMMUNITY): Payer: Self-pay | Admitting: Licensed Clinical Social Worker

## 2019-03-16 ENCOUNTER — Other Ambulatory Visit: Payer: Self-pay

## 2019-03-16 ENCOUNTER — Ambulatory Visit (INDEPENDENT_AMBULATORY_CARE_PROVIDER_SITE_OTHER): Payer: Commercial Managed Care - PPO | Admitting: Licensed Clinical Social Worker

## 2019-03-16 DIAGNOSIS — F418 Other specified anxiety disorders: Secondary | ICD-10-CM

## 2019-03-16 DIAGNOSIS — F4310 Post-traumatic stress disorder, unspecified: Secondary | ICD-10-CM | POA: Diagnosis not present

## 2019-03-16 DIAGNOSIS — M545 Low back pain: Secondary | ICD-10-CM | POA: Diagnosis not present

## 2019-03-16 DIAGNOSIS — R252 Cramp and spasm: Secondary | ICD-10-CM | POA: Diagnosis not present

## 2019-03-16 DIAGNOSIS — M25511 Pain in right shoulder: Secondary | ICD-10-CM | POA: Diagnosis not present

## 2019-03-16 DIAGNOSIS — G603 Idiopathic progressive neuropathy: Secondary | ICD-10-CM | POA: Diagnosis not present

## 2019-03-16 NOTE — Progress Notes (Signed)
Virtual Visit via Video Note  I connected with HARU ANSPAUGH on 03/16/19 at 10:00 AM EST by a video enabled telemedicine application and verified that I am speaking with the correct person using two identifiers.    I discussed the limitations of evaluation and management by telemedicine and the availability of in person appointments. The patient expressed understanding and agreed to proceed.  Type of Therapy: Individual Therapy  Treatment Goals addressed: "PTSD, depression and anxiety. I would like to feel more like myself" .  Interventions: CBT and Motivational Interviewing psychoeducation, grounding and mindfulness techniques  Summary: Kemisha Bonnette is a 53 y.o. female who presents with PTSD and Depression with Anxiety.  Suicidal/Homicidal: No without intent/plan  Therapist Response: Almyra Free met with clinician for an individual session. Carlisa shared about her psychiatric symptoms, her current life events and her homework. Chandni shared that she continues to struggle with depression and grief associated with the loss of her best friend in October. She reports that she continues to expect the phone to ring or to go take her shopping on a Friday. She reports that her friend was always at every holiday, party, dinner. Clinician identified the importance of continuing to keep her friend's memory alive, remembering the stories, the things about her that made her smile, the good and bad times together, etc. Clinician reflected the difficulty of the first year in particular and encouraged Aleenah to allow herself to feel whatever feelings come up at the time.  Debrah also processed thoughts and feelings about her son living in the home. She reports this has not been good and she plans to have a family meeting tonight to address her concerns about his behavior and his lack of help in keeping his space clean.   Plan: Return again in 1-2 weeks.  Diagnosis: Axis I: PTSD and Depression with Anxiety.   I  discussed the assessment and treatment plan with the patient. The patient was provided an opportunity to ask questions and all were answered. The patient agreed with the plan and demonstrated an understanding of the instructions.   The patient was advised to call back or seek an in-person evaluation if the symptoms worsen or if the condition fails to improve as anticipated.  I provided 45 minutes of non-face-to-face time during this encounter.   [name], LCSW

## 2019-03-17 DIAGNOSIS — F17210 Nicotine dependence, cigarettes, uncomplicated: Secondary | ICD-10-CM | POA: Diagnosis not present

## 2019-03-17 DIAGNOSIS — J381 Polyp of vocal cord and larynx: Secondary | ICD-10-CM | POA: Diagnosis not present

## 2019-03-17 DIAGNOSIS — H938X3 Other specified disorders of ear, bilateral: Secondary | ICD-10-CM | POA: Diagnosis not present

## 2019-03-17 DIAGNOSIS — Z87891 Personal history of nicotine dependence: Secondary | ICD-10-CM | POA: Diagnosis not present

## 2019-03-17 DIAGNOSIS — J383 Other diseases of vocal cords: Secondary | ICD-10-CM | POA: Diagnosis not present

## 2019-03-24 DIAGNOSIS — M545 Low back pain: Secondary | ICD-10-CM | POA: Diagnosis not present

## 2019-03-24 DIAGNOSIS — G603 Idiopathic progressive neuropathy: Secondary | ICD-10-CM | POA: Diagnosis not present

## 2019-03-24 DIAGNOSIS — R2681 Unsteadiness on feet: Secondary | ICD-10-CM | POA: Diagnosis not present

## 2019-03-24 DIAGNOSIS — R252 Cramp and spasm: Secondary | ICD-10-CM | POA: Diagnosis not present

## 2019-04-02 ENCOUNTER — Encounter: Payer: Self-pay | Admitting: Family Medicine

## 2019-04-02 ENCOUNTER — Other Ambulatory Visit: Payer: Self-pay | Admitting: Family Medicine

## 2019-04-02 ENCOUNTER — Telehealth: Payer: Self-pay

## 2019-04-02 DIAGNOSIS — F418 Other specified anxiety disorders: Secondary | ICD-10-CM

## 2019-04-02 NOTE — Telephone Encounter (Signed)
Received a fax from [facility] requesting a refill on medication  traZODone (DESYREL) 150 MG tablet Medication Date: 03/09/2019 Department: [facility] Ordering/Authorizing: [place] Crane, MD  Order Providers  Prescribing Provider Encounter Provider  [place] Crane, MD [place] Crane, MD  Outpatient Medication Detail   Disp Refills Start End   traZODone (DESYREL) 150 MG tablet 45 tablet 0 03/09/2019    Sig: Take half tablet as needed for sleep   Sent to pharmacy as: traZODone (DESYREL) 150 MG tablet   E-Prescribing Status: Receipt confirmed by pharmacy (03/09/2019 11:16 AM EST)

## 2019-04-03 ENCOUNTER — Other Ambulatory Visit: Payer: Self-pay

## 2019-04-03 MED ORDER — ROPINIROLE HCL 0.25 MG PO TABS: 0.7500 mg | ORAL_TABLET | Freq: Every day | ORAL | 0 refills | Status: DC

## 2019-04-03 MED ORDER — TRAZODONE HCL 150 MG PO TABS: ORAL_TABLET | ORAL | 1 refills | Status: DC

## 2019-04-03 NOTE — Telephone Encounter (Signed)
Please advise on refill, thanks.

## 2019-04-03 NOTE — Telephone Encounter (Signed)
Done

## 2019-04-03 NOTE — Telephone Encounter (Signed)
Patient takes 40mg  pantoprazole but not originally prescribed by PCP.

## 2019-04-05 NOTE — Progress Notes (Deleted)
Cardiology Office Note  Date: 04/05/2019   ID: Martha Espinoza, DOB 04/04/1965, MRN [id number]  PCP:  Tammi Sou, MD Consulting Cardiologist: Satira Sark, MD Electrophysiologist:  None   No chief complaint on file.   History of Present Illness: Martha Espinoza is a 54 y.o. female referred for cardiology consultation by Dr. Anitra Lauth.  I reviewed the chart, patient referred with history of atypical chest pain as of office note back in November 2020.   She was evaluated by Dr. Tamala Julian back in 2016 from a cardiac perspective, underwent cardiac catheterization in January of that year which did not reveal any significant, obstructive CAD.  There was tapering of the mid to distal LAD, possible intramyocardial course.  Interestingly, she had undergone a cardiac CTA prior to cardiac catheterization showing calcification within the LAD and also suspicion for a greater than 70% mid to distal stenosis which was ultimately not the case, also question of moderate proximal to mid RCA stenosis which was not the case.  Past Medical History:  Diagnosis Date  . Abnormal CT of thoracic spine 09/12/2018   Noncontrast.  Increased in number and TINY increase in size of tiny, patchy sclerotic T spine lesions compared to 2018 CT.  Hem/onc summer 2020->w/u->suspected benign bone islands->survellance imaging in 6-12 mo planned.  . Allergic rhinitis   . Arthritis   . Asthma   . Bipolar 1 disorder ([place])   . Chronic pain syndrome    chronic low back pain (Pain mgmt= Dr. [name]).  MRI showed no signif disc dz, only showed some facet arthropathy at L4-5, with joint diastasis at L4-5--plan as of 11/07/16 neurosurg eval is bilat facet injections.  . Colitis due to Clostridium difficile 2001  . COPD (chronic obstructive pulmonary disease) ([place])    PFTs 04/2016 showed no sign of copd or asthma, plus she actually had worse FEV1 after taking albuterol.  However, as of 06/2018 she has recent hx of improvement in  sx's after taking albuterol.  LONG TIME SMOKER, won't quit.  Intolerant to or cannot afford: pulmicort, QVAR, advair, symbicort, flovent, bevespi, and spiriva, anoro,stiolto, and trelegy. Pulmicort trial per pulm 12/2018  . Depression   . GAD (generalized anxiety disorder)   . GERD (gastroesophageal reflux disease)   . Hepatic hemangioma   . Hepatic steatosis 06/2016   Noted incidentally on CT chest  . History of Salmonella gastroenteritis   . Hoarseness    chronic: laryngoscopy showed edema of vocal folds and R focal fold leukoplakia->>NEEDS TO QUIT SMOKING! Rpt laryngoscopy 03/17/19 showed the leukoplakia spot smaller->to be followed with serial exams by ent.  . Hyperlipidemia    a. Noted 02/2014.  Marland Kitchen Hypertension   . IBS (irritable bowel syndrome)   . Iron deficiency anemia 05/2014   Per pt it is not due to vaginal blood loss; hemoccults sent to pt in mail 413/16.  . Lumbar spondylosis    Mild, mainly focused at L4-5 and L5-S1.  MRI L spine 09/09/17-->mild degenerative disc disease and facet arthrosis without any nerve root encroachment, no spinal stenosis.  . Microscopic hematuria    Intermittent (no w/u done yet, as of 03/2014)  . Migraine headache   . Morbid obesity ([place])   . Multiple lipomas 09/2016   Left upper arm--very small ones.  . Nocturnal hypoxia 09/18/2017   Overnight oximetry -->qualified: ordered 2L oxygen during sleep.  . Obesity hypoventilation syndrome ([place])   . Pain in both lower legs 2018-2019   Pain and  numbness bilat LLs.  NCS/EMGs findings suggestive of S1 radiculopathy bilat: sx's + NCS findings suggestive of neurogenic claudication/spinal stenosis.  ? contribution of venous reflux?  . Peripheral edema Fall 2018   R>L, non-pitting for the most part--venous doppler neg for DVT 01/2017.  Saw Dr. Bridgett Larsson (Vasc) 04/12/17 and u/s showed some venous reflux dz but he felt her sx's in legs were NOT due to her venous insufficiency OR PAD.  Thigh high comp stockings rx'd.  .  Polycystic ovarian syndrome   . Pruritic dermatitis 2015/2016   +Scabies prep at [facility] 07/2014. (Primarily pruritic skin, but eventually a subtle rash as well)  . PTSD (post-traumatic stress disorder)   . Raynaud's phenomenon 2019  . Rectal cancer ([place])    a. Followed by Dr. Gala Romney, dx 2000-2001.  Surveillance colonoscopy planned as of 11/2018 GI f/u.  . Right shoulder pain 2017   Murphy/Wainer: RC bursitis + RC tear (MRI)--injection trial is plan per pt report 05/04/16.  . Vitamin D deficiency   . Wheezing    Most of her resp symptoms are c/w upper airway etiology (repeated PFTs ->no obstructive dz). Pulm referred her to ENT 12/2018.    Past Surgical History:  Procedure Laterality Date  . ABI's complete Bilateral 09/30/2017   Normal ABIs.  Great toe pressures adequate for wound healing.  Femoral artery waveforms triphasic (normal).  . CARDIOVASCULAR STRESS TEST  03/24/14   Lexscan MIBI: mild anterior ischemia?  Cardiac CT angiogram recommended/done.  . CHOLECYSTECTOMY    . COLONOSCOPY  05/05/2007   adenoma  . coronary CT angio  03/29/14   Two vessel dz/moderate stenosis of mid LAD and proximal RCA; cath recommended.  Marland Kitchen DILATION AND CURETTAGE OF UTERUS     for vaginal bleeding  . JOINT REPLACEMENT    . laryngoscopy  02/11/2019   (Done b/c of hoarseness and hx of wheezing"Reinke's edema of vocal folds, right vocal cord leukoplakia-->plan for bx of leukoplakia->NEEDS TO QUIT SMOKING!  . LEFT HEART CATHETERIZATION WITH CORONARY ANGIOGRAM N/A 03/31/2014   Normal coronaries, EF 16-07%, diastolic dysfunction.  Procedure: LEFT HEART CATHETERIZATION WITH CORONARY ANGIOGRAM;  Surgeon: Sinclair Grooms, MD;  Location: [facility] CATH LAB;  Service: Cardiovascular;  Laterality: N/A;  . LOWER EXT VENOUS DOPPLERS Bilateral 02/08/2017   NEG for DVT (bilat)  . NCS/EMG  10/03/2017   S1 radiculopathy bilat  . Overnight oximetry testing  09/2017   qualified for oxygen supplementation during sleep  .  PFTs  04/2016   No sign of COPD or asthma; FEV 1 worse after albut.  . rectal mss  age 88   High-grade rectal adenoma removed from rectum   . TRANSTHORACIC ECHOCARDIOGRAM  03/23/14; 10/21/17   2015 Normal.  2019: mild LVH, EF 65-70%, study technically inadequate to assess diastolic function, valves were fine, wall motion normal.    Current Outpatient Medications  Medication Sig Dispense Refill  . albuterol (PROVENTIL) (2.5 MG/3ML) 0.083% nebulizer solution Take 3 mLs (2.5 mg total) by nebulization every 6 (six) hours as needed for wheezing or shortness of breath. 75 mL 6  . albuterol (VENTOLIN HFA) 108 (90 Base) MCG/ACT inhaler INHALE 1 TO 2 PUFFS BY MOUTH EVERY 4 HOURS AS NEEDED (Patient taking differently: Inhale 1-2 puffs into the lungs 4 (four) times daily. ) 16 g 11  . AMITIZA 24 MCG capsule Take 24 mcg by mouth 2 (two) times daily.  1  . aspirin 81 MG chewable tablet Chew 1 tablet (81 mg total) by  mouth daily. (Patient taking differently: Chew 81 mg by mouth every evening. ) 14 tablet 0  . azelastine (ASTELIN) 0.1 % nasal spray Place 2 sprays into both nostrils 2 (two) times daily. 30 mL 6  . budesonide (PULMICORT) 0.5 MG/2ML nebulizer solution Take 2 mLs by nebulization 2 (two) times daily.    . busPIRone (BUSPAR) 15 MG tablet Take 1 tablet (15 mg total) by mouth 2 (two) times daily. 180 tablet 0  . clonazePAM (KLONOPIN) 0.5 MG tablet TAKE 1 TABLET BY MOUTH DURING THE DAY AS NEEDED AND 2 TABLETS AT BEDTIME (Patient taking differently: Take 0.5-1 mg by mouth See admin instructions. TAKE 1 TABLET (0.5 MG) BY MOUTH DAILY IN THE MORNING & TAKE 2 TABLETS (1 MG) BY MOUTH AT BEDTIME.) 90 tablet 5  . cyclobenzaprine (FLEXERIL) 10 MG tablet TAKE ONE TABLET BY MOUTH EVERY 6 HOURS AS NEEDED (SPASM RELATED TO HEADACHES) (Patient taking differently: Take 10 mg by mouth 3 (three) times daily. ) 42 tablet 5  . diphenhydrAMINE (BENADRYL) 25 MG tablet Take 1 tablet (25 mg total) by mouth every 6 (six) hours.  (Patient taking differently: Take 25 mg by mouth every 6 (six) hours as needed (allergic reactions). ) 20 tablet 0  . EQ ALLERGY RELIEF, CETIRIZINE, 10 MG tablet Take 1 tablet by mouth once daily (Patient taking differently: Take 10 mg by mouth at bedtime. ) 90 tablet 1  . ferrous sulfate 325 (65 FE) MG EC tablet Take 325 mg by mouth every evening.    . fluticasone (FLONASE) 50 MCG/ACT nasal spray Place 2 sprays into both nostrils daily as needed for allergies.    . formoterol (PERFOROMIST) 20 MCG/2ML nebulizer solution Take 2 mLs (20 mcg total) by nebulization 2 (two) times daily. 120 mL 11  . lamoTRIgine (LAMICTAL) 100 MG tablet Take 2 tablets (200 mg total) by mouth at bedtime. 90 tablet 0  . losartan (COZAAR) 50 MG tablet Take 1 tablet by mouth once daily (Patient taking differently: Take 50 mg by mouth every evening. ) 90 tablet 1  . meloxicam (MOBIC) 15 MG tablet Take 15 mg by mouth every evening.   1  . metoprolol tartrate (LOPRESSOR) 50 MG tablet Take 1 tablet by mouth twice daily (Patient taking differently: Take 50 mg by mouth 2 (two) times daily. ) 180 tablet 1  . Naldemedine Tosylate (SYMPROIC) 0.2 MG TABS Take 0.2 mg by mouth at bedtime.     . nitroGLYCERIN (NITROSTAT) 0.4 MG SL tablet DISSOLVE ONE TABLET UNDER THE TONGUE EVERY 5 MINUTES AS NEEDED FOR CHEST PAIN.  DO NOT EXCEED A TOTAL OF 3 DOSES IN 15 MINUTES (Patient taking differently: Place 0.4 mg under the tongue every 5 (five) minutes x 3 doses as needed for chest pain. ) 10 tablet 0  . oxyCODONE-acetaminophen (PERCOCET) 10-325 MG tablet Take 1 tablet by mouth 4 (four) times daily.     . pantoprazole (PROTONIX) 40 MG tablet     . penicillin v potassium (VEETID) 500 MG tablet Take 1 tablet (500 mg total) by mouth 4 (four) times daily. 40 tablet 0  . Potassium (POTASSIMIN PO) Take 2 tablets by mouth at bedtime.    . pregabalin (LYRICA) 200 MG capsule     . promethazine (PHENERGAN) 25 MG tablet 1 tab po q6h prn nausea (Patient taking  differently: Take 25 mg by mouth every 6 (six) hours as needed (nausea). ) 30 tablet 3  . rOPINIRole (REQUIP) 0.25 MG tablet Take 3 tablets (0.75 mg  total) by mouth at bedtime. 270 tablet 0  . rosuvastatin (CRESTOR) 10 MG tablet Take 1 tablet (10 mg total) by mouth daily. 30 tablet 2  . sertraline (ZOLOFT) 50 MG tablet Take 1 tablet (50 mg total) by mouth daily. 90 tablet 0  . traZODone (DESYREL) 150 MG tablet Take half tablet as needed for sleep 45 tablet 1  . varenicline (CHANTIX CONTINUING MONTH PAK) 1 MG tablet Take 1 tablet (1 mg total) by mouth 2 (two) times daily. 60 tablet 1  . varenicline (CHANTIX STARTING MONTH PAK) 0.5 MG X 11 & 1 MG X 42 tablet Take one 0.5 mg tablet by mouth once daily for 3 days, then increase to one 0.5 mg tablet twice daily for 4 days, then increase to one 1 mg tablet twice daily. 53 tablet 0   No current facility-administered medications for this visit.   Allergies:  Contrast media [iodinated diagnostic agents], Shellfish allergy, Codeine, Furosemide, Anoro ellipta [umeclidinium-vilanterol], Flovent diskus [fluticasone propionate (inhal)], Ivermectin, Spiriva handihaler [tiotropium bromide monohydrate], Symbicort [budesonide-formoterol fumarate], Betadine [povidone iodine], Latex, Lipitor [atorvastatin], Permethrin, Stiolto respimat [tiotropium bromide-olodaterol], Tape, and Trelegy ellipta [fluticasone-umeclidin-vilant]   Social History: The patient  reports that she has been smoking cigarettes. She has a 18.00 pack-year smoking history. She has never used smokeless tobacco. She reports that she does not drink alcohol or use drugs.   Family History: The patient's family history includes Anxiety disorder in her mother; Bipolar disorder in her son; Cancer in her father; Colon cancer in her maternal grandfather; Colon cancer (age of onset: 6) in her father; Depression in her mother; Diabetes in her mother; Heart attack in her mother; Heart disease in her mother;  Hyperlipidemia in her mother; Hypertension in her mother; Mental illness in her mother; Thyroid disease in her mother.   ROS:  Please see the history of present illness. Otherwise, complete review of systems is positive for {NONE DEFAULTED:18576::"none"}.  All other systems are reviewed and negative.   Physical Exam: VS:  There were no vitals taken for this visit., BMI There is no height or weight on file to calculate BMI.  Wt Readings from Last 3 Encounters:  03/01/19 264 lb (119.7 kg)  02/18/19 263 lb 3.2 oz (119.4 kg)  02/06/19 266 lb 12.8 oz (121 kg)    General: Patient appears comfortable at rest. HEENT: Conjunctiva and lids normal, oropharynx clear with moist mucosa. Neck: Supple, no elevated JVP or carotid bruits, no thyromegaly. Lungs: Clear to auscultation, nonlabored breathing at rest. Cardiac: Regular rate and rhythm, no S3 or significant systolic murmur, no pericardial rub. Abdomen: Soft, nontender, no hepatomegaly, bowel sounds present, no guarding or rebound. Extremities: No pitting edema, distal pulses 2+. Skin: Warm and dry. Musculoskeletal: No kyphosis. Neuropsychiatric: Alert and oriented x3, affect grossly appropriate.  ECG:  An ECG dated 03/01/2019 was personally reviewed today and demonstrated:  Sinus rhythm with low voltage and nonspecific T wave changes.  Recent Labwork: 01/01/2019: BUN 12; Creatinine, Ser 0.58; Potassium 4.4; Sodium 138 02/06/2019: ALT 6; AST 8; Hemoglobin 14.4; Platelets 278     Component Value Date/Time   CHOL 226 (H) 02/06/2019 1422   TRIG 276 (H) 02/06/2019 1422   HDL 40 (L) 02/06/2019 1422   CHOLHDL 5.7 (H) 02/06/2019 1422   VLDL 64.8 (H) 12/04/2018 1057   LDLCALC 144 (H) 02/06/2019 1422   LDLDIRECT 177.0 12/04/2018 1057    Other Studies Reviewed Today:  High-resolution chest CT 09/12/2018: FINDINGS: Cardiovascular: Normal heart size. No significant pericardial effusion/thickening.  Left anterior descending coronary  atherosclerosis. Great vessels are normal in course and caliber.  Mediastinum/Nodes: No discrete thyroid nodules. Unremarkable esophagus. No pathologically enlarged axillary, mediastinal or hilar lymph nodes, noting limited sensitivity for the detection of hilar adenopathy on this noncontrast study.  Lungs/Pleura: No pneumothorax. No pleural effusion. No acute consolidative airspace disease, lung masses or significant pulmonary nodules. Mild patchy air trapping in both lungs on the expiration sequence. No significant regions of subpleural reticulation, ground-glass attenuation, traction bronchiectasis, architectural distortion or frank honeycombing. No evidence of tracheobronchomalacia.  Upper abdomen: Diffuse hepatic steatosis.  Cholecystectomy.  Musculoskeletal: There are innumerable tiny sclerotic osseous lesions scattered throughout the visualized thoracic skeleton with a patchy distribution, increased in conspicuity and number in the interval. Previously described dominant sclerotic lesions in T9 and L1 vertebral bodies of slightly increased in size, for example measuring 1.1 cm in the posterior T9 vertebral body (series 8/image 96), previously 1.0 cm, and 0.7 cm in the superior L1 vertebral body (series 8/image 90), previously 0.6 cm. Mild thoracic spondylosis.  IMPRESSION: 1. Mildly progressive patchy sclerotic osseous lesions throughout the visualized skeleton compared to 2018 CT study, many of which are tiny. Findings are nonspecific, worrisome for a progressive infiltrative marrow process such as myelofibrosis. Sclerotic osseous metastatic disease seems unlikely given the slow change, however is not entirely excluded. Hematology oncology consultation suggested. Bone marrow biopsy may be considered. 2. No evidence of interstitial lung disease. 3. Stable mild patchy air trapping in both lungs, indicative of small airways disease. 4. One vessel coronary  atherosclerosis. 5. Diffuse hepatic steatosis.  Echocardiogram 10/21/2017: Study Conclusions  - Left ventricle: The cavity size was normal. Wall thickness was   increased in a pattern of mild LVH. Systolic function was   vigorous. The estimated ejection fraction was in the range of 65%   to 70%. Wall motion was normal; there were no regional wall   motion abnormalities. The study is not technically sufficient to   allow evaluation of LV diastolic function. - Aortic valve: Mildly calcified annulus. Trileaflet; normal   thickness leaflets. Valve area (VTI): 2.32 cm^2. Valve area   (Vmax): 2.11 cm^2. - Technically adequate study.  Cardiac catheterization 03/31/2014: HEMODYNAMICS:  Aortic pressure 139/85 mmHg; LV pressure 145/15 mmHg; LVEDP 22 mmHg  ANGIOGRAPHIC DATA:   The left main coronary artery is normal.  The left anterior descending artery is widely patent in the proximal and mid vessel. The vessel tapers in the mid to distal segment without significant obstruction. A large first diagonal appears normal. The mid to distal LAD may have intramyocardial course..  The left circumflex artery is widely patent. Gives origin to 2 obtuse marginal branches. The first marginal arises GERD proximally, is small, and has a core second be compatible with the ramus intermedius. No significant obstruction is noted. The second marginal is large and trifurcates on the inferolateral wall. No obstruction is noted.  The right coronary artery is codominant. The vessel is normal..   LEFT VENTRICULOGRAM:  Left ventricular angiogram was done in the 30 RAO projection and revealed upper limit of normal cavity size with low normal contractility and an estimated ejection fraction of 50-55%.  Assessment and Plan:   Medication Adjustments/Labs and Tests Ordered: Current medicines are reviewed at length with the patient today.  Concerns regarding medicines are outlined above.   Tests Ordered: No orders  of the defined types were placed in this encounter.   Medication Changes: No orders of the defined types were placed in this  encounter.   Disposition:  Follow up {follow up:15908}  Signed, Satira Sark, MD, [facility] 04/05/2019 5:06 PM    [facility] [address] [place], [place] [address] Phone: [phone]; Fax: [phone]

## 2019-04-06 ENCOUNTER — Ambulatory Visit: Payer: Commercial Managed Care - PPO | Admitting: Cardiology

## 2019-04-08 ENCOUNTER — Encounter (HOSPITAL_COMMUNITY): Payer: Self-pay | Admitting: Licensed Clinical Social Worker

## 2019-04-08 ENCOUNTER — Ambulatory Visit (INDEPENDENT_AMBULATORY_CARE_PROVIDER_SITE_OTHER): Payer: Commercial Managed Care - PPO | Admitting: Licensed Clinical Social Worker

## 2019-04-08 ENCOUNTER — Other Ambulatory Visit: Payer: Self-pay

## 2019-04-08 DIAGNOSIS — F4310 Post-traumatic stress disorder, unspecified: Secondary | ICD-10-CM | POA: Diagnosis not present

## 2019-04-08 DIAGNOSIS — F418 Other specified anxiety disorders: Secondary | ICD-10-CM | POA: Diagnosis not present
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
## 2019-04-09 ENCOUNTER — Other Ambulatory Visit: Payer: Self-pay | Admitting: Family Medicine

## 2019-04-09 NOTE — Telephone Encounter (Signed)
Spoke w/ patient and will send another 30 day supply until lab visit, 05/13/19.

## 2019-04-15 ENCOUNTER — Other Ambulatory Visit: Payer: Self-pay

## 2019-04-15 ENCOUNTER — Ambulatory Visit (INDEPENDENT_AMBULATORY_CARE_PROVIDER_SITE_OTHER): Payer: Commercial Managed Care - PPO | Admitting: Licensed Clinical Social Worker

## 2019-04-15 ENCOUNTER — Encounter (HOSPITAL_COMMUNITY): Payer: Self-pay | Admitting: Licensed Clinical Social Worker

## 2019-04-15 DIAGNOSIS — F4310 Post-traumatic stress disorder, unspecified: Secondary | ICD-10-CM | POA: Diagnosis not present
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
## 2019-04-22 ENCOUNTER — Other Ambulatory Visit: Payer: Self-pay

## 2019-04-22 ENCOUNTER — Ambulatory Visit (INDEPENDENT_AMBULATORY_CARE_PROVIDER_SITE_OTHER): Payer: Commercial Managed Care - PPO | Admitting: Licensed Clinical Social Worker

## 2019-04-22 ENCOUNTER — Encounter (HOSPITAL_COMMUNITY): Payer: Self-pay | Admitting: Licensed Clinical Social Worker

## 2019-04-22 DIAGNOSIS — F418 Other specified anxiety disorders: Secondary | ICD-10-CM

## 2019-04-22 DIAGNOSIS — F4310 Post-traumatic stress disorder, unspecified: Secondary | ICD-10-CM

## 2019-04-22 NOTE — Progress Notes (Signed)
Virtual Visit via Video Note  I connected with Martha Espinoza on 04/22/19 at 11:00 AM EST by a video enabled telemedicine application and verified that I am speaking with the correct person using two identifiers.     I discussed the limitations of evaluation and management by telemedicine and the availability of in person appointments. The patient expressed understanding and agreed to proceed.  Type of Therapy: Individual Therapy  Treatment Goals addressed:"PTSD, depression and anxiety. I would like to feel more like myself" . Interventions: CBT and Motivational Interviewing psychoeducation, grounding and mindfulness techniques  Summary:Martha Blakelyis a 54y.o. female who presents with PTSD and Depression with Anxiety.  Suicidal/Homicidal: No without intent/plan  Therapist Response:Juliemet with clinician for an individual session. Martha Espinoza about her psychiatric symptoms, her current life events and her homework.Martha Espinoza that she had fallen about 5 times since last week. She reported bruising on her head, back, and knees. She also noted that one fall resulted in her being stuck on the floor, unable to get up for an hour. Clinician explored options for getting Martha Espinoza a walker or some other supports around the house to reduce chances of falling. Martha Espinoza reported she will see if her doctor can write a prescription for a walker to get insurance coverage. Clinician researched walkers on [employer] and noted one for about $50-$60. Clinician discussed mood and interactions at home. Martha Espinoza processed feelings about nephew moving out last week. Clinician explored the reasons given and the change in nephew's bxs. Clinician validated sadness and hurt feelings, as well as provided reality testing that he is 19 and he can make his own decisions. Clinician discussed ways to reduce sad thoughts about nephew and to focus on what she can control.   Plan: Return again in 1-2 weeks.  Diagnosis: Axis  I:PTSD and Depression with Anxiety.     I discussed the assessment and treatment plan with the patient. The patient was provided an opportunity to ask questions and all were answered. The patient agreed with the plan and demonstrated an understanding of the instructions.   The patient was advised to call back or seek an in-person evaluation if the symptoms worsen or if the condition fails to improve as anticipated.  I provided 45 minutes of non-face-to-face time during this encounter.   [name], LCSW

## 2019-04-23 DIAGNOSIS — M79606 Pain in leg, unspecified: Secondary | ICD-10-CM | POA: Diagnosis not present

## 2019-04-23 DIAGNOSIS — M797 Fibromyalgia: Secondary | ICD-10-CM | POA: Diagnosis not present

## 2019-04-23 DIAGNOSIS — R252 Cramp and spasm: Secondary | ICD-10-CM | POA: Diagnosis not present

## 2019-04-23 DIAGNOSIS — G603 Idiopathic progressive neuropathy: Secondary | ICD-10-CM | POA: Diagnosis not present

## 2019-04-24 NOTE — Patient Instructions (Signed)
NIDRA RULON  04/24/2019     @PREFPERIOPPHARMACY @   Your procedure is scheduled on  04/30/2019.  Report to [facility] at  [facility]  A.M.  Call this number if you have problems the morning of surgery:  [phone]   Remember:  Follow the diet and prep instructions given to you by Dr Roseanne Kaufman office,                    Take these medicines the morning of surgery with A SIP OF WATER  Buspar, clonazepam, flexeril(if needed), mobic (if needed), metoprolol, oxycodone(if needed), protonix, lyrica, phenergan(if needed), zoloft, Use your nebulizer and your inhalers before you come. Bring your rescue inhaler with you.    Do not wear jewelry, make-up or nail polish.  Do not wear lotions, powders, or perfumes. Please wear deodorant and brush your teeth.  Do not shave 48 hours prior to surgery.  Men may shave face and neck.  Do not bring valuables to the hospital.  [facility] is not responsible for any belongings or valuables.  Contacts, dentures or bridgework may not be worn into surgery.  Leave your suitcase in the car.  After surgery it may be brought to your room.  For patients admitted to the hospital, discharge time will be determined by your treatment team.  Patients discharged the day of surgery will not be allowed to drive home.   Name and phone number of your driver:   family Special instructions:  None  Please read over the following fact sheets that you were given. Anesthesia Post-op Instructions and Care and Recovery After Surgery       Colonoscopy, Adult, Care After This sheet gives you information about how to care for yourself after your procedure. Your health care provider may also give you more specific instructions. If you have problems or questions, contact your health care provider. What can I expect after the procedure? After the procedure, it is common to have:  A small amount of blood in your stool for 24 hours after the procedure.  Some gas.  Mild  cramping or bloating of your abdomen. Follow these instructions at home: Eating and drinking   Drink enough fluid to keep your urine pale yellow.  Follow instructions from your health care provider about eating or drinking restrictions.  Resume your normal diet as instructed by your health care provider. Avoid heavy or fried foods that are hard to digest. Activity  Rest as told by your health care provider.  Avoid sitting for a long time without moving. Get up to take short walks every 1-2 hours. This is important to improve blood flow and breathing. Ask for help if you feel weak or unsteady.  Return to your normal activities as told by your health care provider. Ask your health care provider what activities are safe for you. Managing cramping and bloating   Try walking around when you have cramps or feel bloated.  Apply heat to your abdomen as told by your health care provider. Use the heat source that your health care provider recommends, such as a moist heat pack or a heating pad. ? Place a towel between your skin and the heat source. ? Leave the heat on for 20-30 minutes. ? Remove the heat if your skin turns bright red. This is especially important if you are unable to feel pain, heat, or cold. You may have a greater risk of getting burned. General instructions  For the first 24 hours after the procedure: ? Do not drive or use machinery. ? Do not sign important documents. ? Do not drink alcohol. ? Do your regular daily activities at a slower pace than normal. ? Eat soft foods that are easy to digest.  Take over-the-counter and prescription medicines only as told by your health care provider.  Keep all follow-up visits as told by your health care provider. This is important. Contact a health care provider if:  You have blood in your stool 2-3 days after the procedure. Get help right away if you have:  More than a small spotting of blood in your stool.  Large blood  clots in your stool.  Swelling of your abdomen.  Nausea or vomiting.  A fever.  Increasing pain in your abdomen that is not relieved with medicine. Summary  After the procedure, it is common to have a small amount of blood in your stool. You may also have mild cramping and bloating of your abdomen.  For the first 24 hours after the procedure, do not drive or use machinery, sign important documents, or drink alcohol.  Get help right away if you have a lot of blood in your stool, nausea or vomiting, a fever, or increased pain in your abdomen. This information is not intended to replace advice given to you by your health care provider. Make sure you discuss any questions you have with your health care provider. Document Revised: 10/06/2018 Document Reviewed: 10/06/2018 Elsevier Patient Education  [place] After These instructions provide you with information about caring for yourself after your procedure. Your health care provider may also give you more specific instructions. Your treatment has been planned according to current medical practices, but problems sometimes occur. Call your health care provider if you have any problems or questions after your procedure. What can I expect after the procedure? After your procedure, you may:  Feel sleepy for several hours.  Feel clumsy and have poor balance for several hours.  Feel forgetful about what happened after the procedure.  Have poor judgment for several hours.  Feel nauseous or vomit.  Have a sore throat if you had a breathing tube during the procedure. Follow these instructions at home: For at least 24 hours after the procedure:      Have a responsible adult stay with you. It is important to have someone help care for you until you are awake and alert.  Rest as needed.  Do not: ? Participate in activities in which you could fall or become injured. ? Drive. ? Use heavy  machinery. ? Drink alcohol. ? Take sleeping pills or medicines that cause drowsiness. ? Make important decisions or sign legal documents. ? Take care of children on your own. Eating and drinking  Follow the diet that is recommended by your health care provider.  If you vomit, drink water, juice, or soup when you can drink without vomiting.  Make sure you have little or no nausea before eating solid foods. General instructions  Take over-the-counter and prescription medicines only as told by your health care provider.  If you have sleep apnea, surgery and certain medicines can increase your risk for breathing problems. Follow instructions from your health care provider about wearing your sleep device: ? Anytime you are sleeping, including during daytime naps. ? While taking prescription pain medicines, sleeping medicines, or medicines that make you drowsy.  If you smoke, do not smoke without supervision.  Keep  all follow-up visits as told by your health care provider. This is important. Contact a health care provider if:  You keep feeling nauseous or you keep vomiting.  You feel light-headed.  You develop a rash.  You have a fever. Get help right away if:  You have trouble breathing. Summary  For several hours after your procedure, you may feel sleepy and have poor judgment.  Have a responsible adult stay with you for at least 24 hours or until you are awake and alert. This information is not intended to replace advice given to you by your health care provider. Make sure you discuss any questions you have with your health care provider. Document Revised: 06/10/2017 Document Reviewed: 07/03/2015 Elsevier Patient Education  [place].

## 2019-04-27 ENCOUNTER — Encounter: Payer: Self-pay | Admitting: Family Medicine

## 2019-04-27 HISTORY — PX: OTHER SURGICAL HISTORY: SHX169

## 2019-04-28 ENCOUNTER — Encounter (HOSPITAL_COMMUNITY): Payer: Self-pay

## 2019-04-28 ENCOUNTER — Other Ambulatory Visit: Payer: Self-pay

## 2019-04-28 ENCOUNTER — Telehealth: Payer: Self-pay

## 2019-04-28 ENCOUNTER — Encounter (HOSPITAL_COMMUNITY)
Admission: RE | Admit: 2019-04-28 | Discharge: 2019-04-28 | Disposition: A | Discharge status: Home / Self Care | Payer: Commercial Managed Care - PPO | Source: Ambulatory Visit | Attending: Internal Medicine | Admitting: Internal Medicine

## 2019-04-28 ENCOUNTER — Other Ambulatory Visit (HOSPITAL_COMMUNITY)
Admission: RE | Admit: 2019-04-28 | Discharge: 2019-04-28 | Disposition: A | Discharge status: Home / Self Care | Payer: Commercial Managed Care - PPO | Source: Ambulatory Visit | Attending: Internal Medicine | Admitting: Internal Medicine

## 2019-04-28 NOTE — Telephone Encounter (Signed)
Endo scheduler called office, pt had pre-op appt today and told nurse she has been having some chest pain. States she has cardiology appt next week. RMR advised pre-op nurse to cancel TCS that was for 04/30/19.

## 2019-04-28 NOTE — Progress Notes (Addendum)
Patient arrived for PAT testing today  for a Colonoscopy on 04/30/2019. She has been having chest pains for several months with unknown cause. Denies any chest pain today. Instructed to call 911 or go to the nearest Emergency Room if she has any further chest pains. Patient has an appointment with [facility] next week for follow up.  Spoke with Dr. Gala Romney and he agrees that we will cancel procedure for now and reevaluate  after her appointment with Cardiology.

## 2019-04-30 ENCOUNTER — Ambulatory Visit (HOSPITAL_COMMUNITY)
Admission: RE | Admit: 2019-04-30 | Payer: Commercial Managed Care - PPO | Source: Home / Self Care | Admitting: Internal Medicine

## 2019-04-30 ENCOUNTER — Encounter (HOSPITAL_COMMUNITY): Admission: RE | Payer: Self-pay | Source: Home / Self Care

## 2019-04-30 SURGERY — COLONOSCOPY WITH PROPOFOL
Anesthesia: Monitor Anesthesia Care

## 2019-05-04 ENCOUNTER — Other Ambulatory Visit: Payer: Self-pay

## 2019-05-04 ENCOUNTER — Encounter (HOSPITAL_COMMUNITY): Payer: Self-pay | Admitting: Licensed Clinical Social Worker

## 2019-05-04 ENCOUNTER — Ambulatory Visit (INDEPENDENT_AMBULATORY_CARE_PROVIDER_SITE_OTHER): Payer: Commercial Managed Care - PPO | Admitting: Licensed Clinical Social Worker

## 2019-05-04 DIAGNOSIS — F4310 Post-traumatic stress disorder, unspecified: Secondary | ICD-10-CM

## 2019-05-04 DIAGNOSIS — F418 Other specified anxiety disorders: Secondary | ICD-10-CM

## 2019-05-04 NOTE — Progress Notes (Signed)
Virtual Visit via Video Note  I connected with Martha Espinoza on 05/04/19 at 10:00 AM EST by a video enabled telemedicine application and verified that I am speaking with the correct person using two identifiers.     I discussed the limitations of evaluation and management by telemedicine and the availability of in person appointments. The patient expressed understanding and agreed to proceed.  Type of Therapy: Individual Therapy  Treatment Goals addressed:"PTSD, depression and anxiety. I would like to feel more like myself" . Interventions: CBT and Motivational Interviewing psychoeducation  Summary:Martha Blakelyis a 53y.o. female who presents with PTSD and Depression with Anxiety.  Suicidal/Homicidal: No without intent/plan  Therapist Response:Juliemet with clinician for an individual session. Martha Espinoza about her psychiatric symptoms, her current life events and her homework.Martha Espinoza ongoing difficulty coping with her decline in physical health. She reports feeling very depressed and down due to needing more care and assistance at home. Clinician utilized MI OARS to reflect and summarize thoughts and feelings. Clinician provided psychoeducation about roles and how things like illness can impact the ability to have comfort in previous roles. Clinician noted the use of "should" in [name]'s discussion of her role as the cared for, not carer. Clinician provided CBT psychoeducation about cognitive distortions, particularly "shoulds".  Clinician identified the importance of allowing herself to be cared for without guilt or shame. Clinician reviewed treatment plan. No changes to goals.   Plan: Return again in 1-2 weeks.  Diagnosis: Axis I:PTSD and Depression with Anxiety.     I discussed the assessment and treatment plan with the patient. The patient was provided an opportunity to ask questions and all were answered. The patient agreed with the plan and demonstrated an  understanding of the instructions.   The patient was advised to call back or seek an in-person evaluation if the symptoms worsen or if the condition fails to improve as anticipated.  I provided 45 minutes of non-face-to-face time during this encounter.   [name], LCSW

## 2019-05-05 ENCOUNTER — Ambulatory Visit (INDEPENDENT_AMBULATORY_CARE_PROVIDER_SITE_OTHER): Payer: Commercial Managed Care - PPO | Admitting: Cardiology

## 2019-05-05 ENCOUNTER — Inpatient Hospital Stay (HOSPITAL_COMMUNITY): Payer: Commercial Managed Care - PPO | Attending: Hematology

## 2019-05-05 ENCOUNTER — Other Ambulatory Visit: Payer: Self-pay

## 2019-05-05 ENCOUNTER — Encounter: Payer: Self-pay | Admitting: Cardiology

## 2019-05-05 ENCOUNTER — Ambulatory Visit (INDEPENDENT_AMBULATORY_CARE_PROVIDER_SITE_OTHER): Payer: Commercial Managed Care - PPO

## 2019-05-05 VITALS — BP 93/62 | HR 90 | Temp 97.9°F | Ht 60.0 in | Wt 253.0 lb

## 2019-05-05 DIAGNOSIS — M859 Disorder of bone density and structure, unspecified: Secondary | ICD-10-CM | POA: Diagnosis not present

## 2019-05-05 DIAGNOSIS — R079 Chest pain, unspecified: Secondary | ICD-10-CM | POA: Diagnosis not present

## 2019-05-05 DIAGNOSIS — M899 Disorder of bone, unspecified: Secondary | ICD-10-CM

## 2019-05-05 DIAGNOSIS — I10 Essential (primary) hypertension: Secondary | ICD-10-CM

## 2019-05-05 DIAGNOSIS — F17210 Nicotine dependence, cigarettes, uncomplicated: Secondary | ICD-10-CM | POA: Insufficient documentation

## 2019-05-05 DIAGNOSIS — K219 Gastro-esophageal reflux disease without esophagitis: Secondary | ICD-10-CM | POA: Diagnosis not present

## 2019-05-05 DIAGNOSIS — R002 Palpitations: Secondary | ICD-10-CM

## 2019-05-05 LAB — CBC WITH DIFFERENTIAL/PLATELET
Abs Immature Granulocytes: 0.03 10*3/uL (ref 0.00–0.07)
Basophils Absolute: 0 10*3/uL (ref 0.0–0.1)
Basophils Relative: 0 %
Eosinophils Absolute: 0.1 10*3/uL (ref 0.0–0.5)
Eosinophils Relative: 1 %
HCT: 45.4 % (ref 36.0–46.0)
Hemoglobin: 14.4 g/dL (ref 12.0–15.0)
Immature Granulocytes: 0 %
Lymphocytes Relative: 21 %
Lymphs Abs: 2 10*3/uL (ref 0.7–4.0)
MCH: 29.4 pg (ref 26.0–34.0)
MCHC: 31.7 g/dL (ref 30.0–36.0)
MCV: 92.8 fL (ref 80.0–100.0)
Monocytes Absolute: 0.5 10*3/uL (ref 0.1–1.0)
Monocytes Relative: 5 %
Neutro Abs: 6.8 10*3/uL (ref 1.7–7.7)
Neutrophils Relative %: 73 %
Platelets: 251 10*3/uL (ref 150–400)
RBC: 4.89 MIL/uL (ref 3.87–5.11)
RDW: 14.2 % (ref 11.5–15.5)
WBC: 9.5 10*3/uL (ref 4.0–10.5)
nRBC: 0 % (ref 0.0–0.2)

## 2019-05-05 LAB — COMPREHENSIVE METABOLIC PANEL
ALT: 11 U/L (ref 0–44)
AST: 12 U/L — ABNORMAL LOW (ref 15–41)
Albumin: 4.2 g/dL (ref 3.5–5.0)
Alkaline Phosphatase: 89 U/L (ref 38–126)
Anion gap: 9 (ref 5–15)
BUN: 16 mg/dL (ref 6–20)
CO2: 27 mmol/L (ref 22–32)
Calcium: 9 mg/dL (ref 8.9–10.3)
Chloride: 101 mmol/L (ref 98–111)
Creatinine, Ser: 0.95 mg/dL (ref 0.44–1.00)
GFR calc Af Amer: >60 mL/min (ref >60)
GFR calc non Af Amer: >60 mL/min (ref >60)
Glucose, Bld: 109 mg/dL — ABNORMAL HIGH (ref 70–99)
Potassium: 4.3 mmol/L (ref 3.5–5.1)
Sodium: 137 mmol/L (ref 135–145)
Total Bilirubin: 0.8 mg/dL (ref 0.3–1.2)
Total Protein: 7.1 g/dL (ref 6.5–8.1)

## 2019-05-05 NOTE — Patient Instructions (Signed)
Medication Instructions:  Your physician recommends that you continue on your current medications as directed. Please refer to the Current Medication list given to you today.  *If you need a refill on your cardiac medications before your next appointment, please call your pharmacy*  Lab Work: None  If you have labs (blood work) drawn today and your tests are completely normal, you will receive your results only by: Marland Kitchen MyChart Message (if you have MyChart) OR . A paper copy in the mail If you have any lab test that is abnormal or we need to change your treatment, we will call you to review the results.  Testing/Procedures: Your physician has requested that you have an echocardiogram. Echocardiography is a painless test that uses sound waves to create images of your heart. It provides your doctor with information about the size and shape of your heart and how well your heart's chambers and valves are working. This procedure takes approximately one hour. There are no restrictions for this procedure.  Your physician has recommended that you wear an event monitor( 7 day Zio) . Event monitors are medical devices that record the heart's electrical activity. Doctors most often [place] these monitors to diagnose arrhythmias. Arrhythmias are problems with the speed or rhythm of the heartbeat. The monitor is a small, portable device. You can wear one while you do your normal daily activities. This is usually used to diagnose what is causing palpitations/syncope (passing out).    Follow-Up: At [facility], you and your health needs are our priority.  As part of our continuing mission to provide you with exceptional heart care, we have created designated Provider Care Teams.  These Care Teams include your primary Cardiologist (physician) and Advanced Practice Providers (APPs -  Physician Assistants and Nurse Practitioners) who all work together to provide you with the care you need, when you need it.  Your  next appointment:  We will call you with results.    Thank you for choosing [facility] !

## 2019-05-05 NOTE — Progress Notes (Signed)
Cardiology Office Note  Date: 05/05/2019   ID: Martha Espinoza, DOB April 06, 1965, MRN [id number]  PCP:  Tammi Sou, MD  Consulting Cardiologist: Satira Sark, MD Electrophysiologist:  None   Chief Complaint  Patient presents with  . History of chest pain    History of Present Illness: Martha Espinoza is a 54 y.o. female referred for cardiology consultation by Dr. Anitra Lauth for the evaluation of chest discomfort.  She is here today with her husband.  She states that over the last 6 months she has been experiencing intermittent episodes of chest discomfort, points to her epigastric area and states that it sometimes moves up into her chest.  This is not necessarily associated with reflux, no dysphagia.  Also no relation to activity.  She tends to notices this at rest, possibly a vague sense of palpitation and weakness.  She states that her blood pressure was low during one of the episodes with systolic in the [id number].  She has had no frank syncope.  Record review finds previous cardiac evaluation by Dr. Tamala Julian status post cardiac catheterization via right radial approach in January 2016 which revealed essentially normal coronary arteries with some tapering of the mid to distal LAD likely due to intramyocardial course, no focal obstruction noted.  I personally reviewed her ECG today which shows sinus rhythm with low voltage and decreased R wave progression.  I reviewed her medications which are outlined below.  She does not report any new additions.  She is in a wheelchair today.  States that she has been under a lot of stress.  She sees multiple specialists.  Past Medical History:  Diagnosis Date  . Abnormal CT of thoracic spine 09/12/2018   Noncontrast.  Increased in number and TINY increase in size of tiny, patchy sclerotic T spine lesions compared to 2018 CT.  Hem/onc summer 2020->w/u->suspected benign bone islands->survellance imaging in 6-12 mo planned.  . Allergic rhinitis   .  Arthritis   . Asthma   . Bipolar 1 disorder ([place])   . Chronic pain syndrome    chronic low back pain (Pain mgmt= Dr. [name]).  MRI showed no signif disc dz, only showed some facet arthropathy at L4-5, with joint diastasis at L4-5--plan as of 11/07/16 neurosurg eval is bilat facet injections. (chronic LBP+ fibromyalgia)  . Colitis due to Clostridium difficile 2001  . COPD (chronic obstructive pulmonary disease) ([place])    PFTs 04/2016 showed no sign of copd or asthma, plus she actually had worse FEV1 after taking albuterol.  However, as of 06/2018 she has recent hx of improvement in sx's after taking albuterol.  LONG TIME SMOKER, won't quit.  Intolerant to or cannot afford: pulmicort, QVAR, advair, symbicort, flovent, bevespi, and spiriva, anoro,stiolto, and trelegy. Pulmicort trial per pulm 12/2018  . Depression   . Fibromyalgia syndrome   . GAD (generalized anxiety disorder)   . GERD (gastroesophageal reflux disease)   . Hepatic hemangioma   . Hepatic steatosis 06/2016   Noted incidentally on CT chest  . History of Salmonella gastroenteritis   . Hoarseness    chronic: laryngoscopy showed edema of vocal folds and R focal fold leukoplakia->>NEEDS TO QUIT SMOKING! Rpt laryngoscopy 03/17/19 showed the leukoplakia spot smaller->to be followed with serial exams by ent.  . Hyperlipidemia    a. Noted 02/2014.  Marland Kitchen Hypertension   . IBS (irritable bowel syndrome)   . Iron deficiency anemia 05/2014   Per pt it is not due to vaginal blood loss;  hemoccults sent to pt in mail 413/16.  . Lumbar spondylosis    Mild, mainly focused at L4-5 and L5-S1.  MRI L spine 09/09/17-->mild degenerative disc disease and facet arthrosis without any nerve root encroachment, no spinal stenosis.  . Microscopic hematuria    Intermittent (no w/u done yet, as of 03/2014)  . Migraine headache   . Morbid obesity ([place])   . Multiple lipomas 09/2016   Left upper arm--very small ones.  . Nocturnal hypoxia 09/18/2017   Overnight  oximetry -->qualified: ordered 2L oxygen during sleep.  . Obesity hypoventilation syndrome ([place])   . Pain in both lower legs 2018-2019   Pain and numbness bilat LLs.  NCS/EMGs findings suggestive of S1 radiculopathy bilat: sx's + NCS findings suggestive of neurogenic claudication/spinal stenosis.  ? contribution of venous reflux?-->"idiopathic peripheral neuropathy, progressive->neurol/pain mgmt  . Peripheral edema Fall 2018   R>L, non-pitting for the most part--venous doppler neg for DVT 01/2017.  Saw Dr. Bridgett Larsson (Vasc) 04/12/17 and u/s showed some venous reflux dz but he felt her sx's in legs were NOT due to her venous insufficiency OR PAD.  Thigh high comp stockings rx'd.  . Polycystic ovarian syndrome   . Pruritic dermatitis 2015/2016   +Scabies prep at [facility] 07/2014. (Primarily pruritic skin, but eventually a subtle rash as well)  . PTSD (post-traumatic stress disorder)   . Raynaud's phenomenon 2019  . Rectal cancer ([place])    a. Followed by Dr. Gala Romney, dx 2000-2001.  Surveillance colonoscopy planned as of 11/2018 GI f/u.  . Right shoulder pain 2017   Murphy/Wainer: RC bursitis + RC tear (MRI)--injection trial is plan per pt report 05/04/16.  . Vitamin D deficiency   . Wheezing    Most of her resp symptoms are c/w upper airway etiology (repeated PFTs ->no obstructive dz). Pulm referred her to ENT 12/2018.    Past Surgical History:  Procedure Laterality Date  . ABI's complete Bilateral 09/30/2017   Normal ABIs.  Great toe pressures adequate for wound healing.  Femoral artery waveforms triphasic (normal).  . CARDIOVASCULAR STRESS TEST  03/24/14   Lexscan MIBI: mild anterior ischemia?  Cardiac CT angiogram recommended/done.  . CHOLECYSTECTOMY    . COLONOSCOPY  05/05/2007   adenoma  . coronary CT angio  03/29/14   Two vessel dz/moderate stenosis of mid LAD and proximal RCA; cath recommended.  Marland Kitchen DILATION AND CURETTAGE OF UTERUS     for vaginal bleeding  . JOINT REPLACEMENT    .  laryngoscopy  02/11/2019   (Done b/c of hoarseness and hx of wheezing"Reinke's edema of vocal folds, right vocal cord leukoplakia-->plan for bx of leukoplakia->NEEDS TO QUIT SMOKING!  . LEFT HEART CATHETERIZATION WITH CORONARY ANGIOGRAM N/A 03/31/2014   Normal coronaries, EF 0000000, diastolic dysfunction.  Procedure: LEFT HEART CATHETERIZATION WITH CORONARY ANGIOGRAM;  Surgeon: Sinclair Grooms, MD;  Location: [facility] CATH LAB;  Service: Cardiovascular;  Laterality: N/A;  . LOWER EXT VENOUS DOPPLERS Bilateral 02/08/2017   NEG for DVT (bilat)  . NCS/EMG  10/03/2017   S1 radiculopathy bilat  . Overnight oximetry testing  09/2017   qualified for oxygen supplementation during sleep  . PFTs  04/2016   No sign of COPD or asthma; FEV 1 worse after albut.  . rectal mss  age 54   High-grade rectal adenoma removed from rectum   . TRANSTHORACIC ECHOCARDIOGRAM  03/23/14; 10/21/17   2015 Normal.  2019: mild LVH, EF 65-70%, study technically inadequate to assess diastolic function, valves were fine,  wall motion normal.    Current Outpatient Medications  Medication Sig Dispense Refill  . albuterol (PROVENTIL) (2.5 MG/3ML) 0.083% nebulizer solution Take 3 mLs (2.5 mg total) by nebulization every 6 (six) hours as needed for wheezing or shortness of breath. 75 mL 6  . albuterol (VENTOLIN HFA) 108 (90 Base) MCG/ACT inhaler INHALE 1 TO 2 PUFFS BY MOUTH EVERY 4 HOURS AS NEEDED (Patient taking differently: Inhale 1-2 puffs into the lungs 4 (four) times daily. ) 16 g 11  . AMITIZA 24 MCG capsule Take 24 mcg by mouth 2 (two) times daily.  1  . aspirin 81 MG chewable tablet Chew 1 tablet (81 mg total) by mouth daily. (Patient taking differently: Chew 81 mg by mouth every evening. ) 14 tablet 0  . azelastine (ASTELIN) 0.1 % nasal spray Place 2 sprays into both nostrils 2 (two) times daily. 30 mL 6  . budesonide (PULMICORT) 0.5 MG/2ML nebulizer solution Take 2 mLs by nebulization 2 (two) times daily.    . busPIRone (BUSPAR)  15 MG tablet Take 1 tablet (15 mg total) by mouth 2 (two) times daily. 180 tablet 0  . clonazePAM (KLONOPIN) 0.5 MG tablet TAKE 1 TABLET BY MOUTH DURING THE DAY AS NEEDED AND 2 TABLETS AT BEDTIME (Patient taking differently: Take 0.5-1 mg by mouth See admin instructions. TAKE 1 TABLET (0.5 MG) BY MOUTH DAILY IN THE MORNING & TAKE 2 TABLETS (1 MG) BY MOUTH AT BEDTIME.) 90 tablet 5  . cyclobenzaprine (FLEXERIL) 10 MG tablet TAKE ONE TABLET BY MOUTH EVERY 6 HOURS AS NEEDED (SPASM RELATED TO HEADACHES) (Patient taking differently: Take 10 mg by mouth 3 (three) times daily. ) 42 tablet 5  . diphenhydrAMINE (BENADRYL) 25 MG tablet Take 1 tablet (25 mg total) by mouth every 6 (six) hours. (Patient taking differently: Take 25 mg by mouth every 6 (six) hours as needed (allergic reactions). ) 20 tablet 0  . EQ ALLERGY RELIEF, CETIRIZINE, 10 MG tablet Take 1 tablet by mouth once daily (Patient taking differently: Take 10 mg by mouth at bedtime. ) 90 tablet 1  . ferrous sulfate 325 (65 FE) MG EC tablet Take 325 mg by mouth every evening.    . fluticasone (FLONASE) 50 MCG/ACT nasal spray Place 2 sprays into both nostrils daily as needed for allergies.    . formoterol (PERFOROMIST) 20 MCG/2ML nebulizer solution Take 2 mLs (20 mcg total) by nebulization 2 (two) times daily. 120 mL 11  . lamoTRIgine (LAMICTAL) 100 MG tablet Take 2 tablets (200 mg total) by mouth at bedtime. 90 tablet 0  . losartan (COZAAR) 50 MG tablet Take 1 tablet by mouth once daily (Patient taking differently: Take 50 mg by mouth every evening. ) 90 tablet 1  . meloxicam (MOBIC) 15 MG tablet Take 15 mg by mouth every evening.   1  . metoprolol tartrate (LOPRESSOR) 50 MG tablet Take 1 tablet by mouth twice daily (Patient taking differently: Take 50 mg by mouth 2 (two) times daily. ) 180 tablet 1  . Naldemedine Tosylate (SYMPROIC) 0.2 MG TABS Take 0.2 mg by mouth at bedtime.     . nitroGLYCERIN (NITROSTAT) 0.4 MG SL tablet DISSOLVE ONE TABLET UNDER  THE TONGUE EVERY 5 MINUTES AS NEEDED FOR CHEST PAIN.  DO NOT EXCEED A TOTAL OF 3 DOSES IN 15 MINUTES (Patient taking differently: Place 0.4 mg under the tongue every 5 (five) minutes x 3 doses as needed for chest pain. ) 10 tablet 0  . oxyCODONE-acetaminophen (PERCOCET) 10-325  MG tablet Take 1 tablet by mouth 4 (four) times daily.     . pantoprazole (PROTONIX) 40 MG tablet 40 mg 2 (two) times daily.     . Potassium (POTASSIMIN PO) Take 2 tablets by mouth at bedtime.    . pregabalin (LYRICA) 200 MG capsule     . promethazine (PHENERGAN) 25 MG tablet 1 tab po q6h prn nausea (Patient taking differently: Take 25 mg by mouth every 6 (six) hours as needed (nausea). ) 30 tablet 3  . rOPINIRole (REQUIP) 0.25 MG tablet Take 3 tablets (0.75 mg total) by mouth at bedtime. 270 tablet 0  . rosuvastatin (CRESTOR) 10 MG tablet Take 1 tablet by mouth once daily 30 tablet 0  . sertraline (ZOLOFT) 50 MG tablet Take 1 tablet (50 mg total) by mouth daily. 90 tablet 0  . traZODone (DESYREL) 150 MG tablet Take half tablet as needed for sleep 45 tablet 1  . varenicline (CHANTIX CONTINUING MONTH PAK) 1 MG tablet Take 1 tablet (1 mg total) by mouth 2 (two) times daily. 60 tablet 1  . varenicline (CHANTIX STARTING MONTH PAK) 0.5 MG X 11 & 1 MG X 42 tablet Take one 0.5 mg tablet by mouth once daily for 3 days, then increase to one 0.5 mg tablet twice daily for 4 days, then increase to one 1 mg tablet twice daily. 53 tablet 0  . penicillin v potassium (VEETID) 500 MG tablet Take 1 tablet (500 mg total) by mouth 4 (four) times daily. 40 tablet 0   No current facility-administered medications for this visit.   Allergies:  Contrast media [iodinated diagnostic agents], Shellfish allergy, Codeine, Furosemide, Anoro ellipta [umeclidinium-vilanterol], Flovent diskus [fluticasone propionate (inhal)], Ivermectin, Spiriva handihaler [tiotropium bromide monohydrate], Symbicort [budesonide-formoterol fumarate], Betadine [povidone iodine],  Latex, Lipitor [atorvastatin], Permethrin, Stiolto respimat [tiotropium bromide-olodaterol], Tape, and Trelegy ellipta [fluticasone-umeclidin-vilant]   Social History: The patient  reports that she has quit smoking. Her smoking use included cigarettes. She has a 18.00 pack-year smoking history. She has never used smokeless tobacco. She reports that she does not drink alcohol or use drugs.   Family History: The patient's family history includes Anxiety disorder in her mother; Bipolar disorder in her son; Cancer in her father; Colon cancer in her maternal grandfather; Colon cancer (age of onset: 25) in her father; Depression in her mother; Diabetes in her mother; Heart attack in her mother; Heart disease in her mother; Hyperlipidemia in her mother; Hypertension in her mother; Mental illness in her mother; Thyroid disease in her mother.   ROS:  Please see the history of present illness. Otherwise, complete review of systems is positive for chronic pain, anxiety and depression.  All other systems are reviewed and negative.   Physical Exam: VS:  BP 93/62   Pulse 90   Temp 97.9 F (36.6 C)   Ht 5' (1.524 m)   Wt 253 lb (114.8 kg)   BMI 49.41 kg/m , BMI Body mass index is 49.41 kg/m.  Wt Readings from Last 3 Encounters:  05/05/19 253 lb (114.8 kg)  04/28/19 258 lb (117 kg)  03/01/19 264 lb (119.7 kg)    General: Patient seated in wheelchair, appears comfortable at rest. HEENT: Conjunctiva and lids normal, wearing a mask. Neck: Supple, no elevated JVP or carotid bruits, no thyromegaly. Lungs: Decreased breath sounds without wheezing, nonlabored breathing at rest. Cardiac: Regular rate and rhythm, no S3 or significant systolic murmur, no pericardial rub. Abdomen: Soft, bowel sounds present. Extremities: No pitting edema, distal pulses  2+. Skin: Warm and dry. Musculoskeletal: No kyphosis. Neuropsychiatric: Alert and oriented x3, affect grossly appropriate.  ECG:  An ECG dated 03/01/2019 was  personally reviewed today and demonstrated:  Sinus rhythm with low voltage and nonspecific T wave changes.  Recent Labwork: 05/05/2019: ALT 11; AST 12; BUN 16; Creatinine, Ser 0.95; Hemoglobin 14.4; Platelets 251; Potassium 4.3; Sodium 137     Component Value Date/Time   CHOL 226 (H) 02/06/2019 1422   TRIG 276 (H) 02/06/2019 1422   HDL 40 (L) 02/06/2019 1422   CHOLHDL 5.7 (H) 02/06/2019 1422   VLDL 64.8 (H) 12/04/2018 1057   LDLCALC 144 (H) 02/06/2019 1422   LDLDIRECT 177.0 12/04/2018 1057    Other Studies Reviewed Today:  Cardiac catheterization 03/31/2014: HEMODYNAMICS:  Aortic pressure 139/85 mmHg; LV pressure 145/15 mmHg; LVEDP 22 mmHg  ANGIOGRAPHIC DATA:   The left main coronary artery is normal.  The left anterior descending artery is widely patent in the proximal and mid vessel. The vessel tapers in the mid to distal segment without significant obstruction. A large first diagonal appears normal. The mid to distal LAD may have intramyocardial course..  The left circumflex artery is widely patent. Gives origin to 2 obtuse marginal branches. The first marginal arises GERD proximally, is small, and has a core second be compatible with the ramus intermedius. No significant obstruction is noted. The second marginal is large and trifurcates on the inferolateral wall. No obstruction is noted.  The right coronary artery is codominant. The vessel is normal..  LEFT VENTRICULOGRAM:  Left ventricular angiogram was done in the 30 RAO projection and revealed upper limit of normal cavity size with low normal contractility and an estimated ejection fraction of 50-55%.  IMPRESSIONS:  1. Essentially normal coronary arteries. The mid to distal LAD tapers in the mid to distal segment possibly because of an intramyocardial course. No focal obstruction is noted in any coronary territory. 2. Normal normal left ventricular systolic function with elevated end-diastolic pressure compatible with  diastolic dysfunction  Assessment and Plan:  1.  Recurrent, atypical chest discomfort as outlined above.  Ischemic etiology seems unlikely, particularly in light of overall reassuring cardiac catheterization from 2016.  ECG reviewed and stable.  Plan is to obtain an echocardiogram to assess cardiac structure and function and also a 7-day Zio patch AT to investigate potential paroxysmal arrhythmia.  2.  Mixed hyperlipidemia, currently on Crestor with follow-up by PCP.  LDL 144 in November 2020.  3.  Essential hypertension by history.  Blood pressure low normal today.  She is currently on Cozaar.  4.  GERD, on Protonix.  Medication Adjustments/Labs and Tests Ordered: Current medicines are reviewed at length with the patient today.  Concerns regarding medicines are outlined above.   Tests Ordered: Orders Placed This Encounter  Procedures  . Cardiac event monitor  . ECHOCARDIOGRAM COMPLETE    Medication Changes: No orders of the defined types were placed in this encounter.   Disposition:  Follow up test results.  Signed, Satira Sark, MD, [facility] 05/05/2019 3:13 PM    [place] [facility] [address], [place], [place] [address] Phone: [phone]; Fax: [phone]

## 2019-05-06 ENCOUNTER — Ambulatory Visit: Payer: Commercial Managed Care - PPO | Admitting: Psychiatry

## 2019-05-06 ENCOUNTER — Other Ambulatory Visit (INDEPENDENT_AMBULATORY_CARE_PROVIDER_SITE_OTHER): Payer: Commercial Managed Care - PPO

## 2019-05-06 DIAGNOSIS — R002 Palpitations: Secondary | ICD-10-CM

## 2019-05-06 LAB — KAPPA/LAMBDA LIGHT CHAINS
Kappa free light chain: 15.6 mg/L (ref 3.3–19.4)
Kappa, lambda light chain ratio: 1.56 (ref 0.26–1.65)
Lambda free light chains: 10 mg/L (ref 5.7–26.3)

## 2019-05-06 LAB — PROTEIN ELECTROPHORESIS, SERUM
A/G Ratio: 1.5 (ref 0.7–1.7)
Albumin ELP: 3.8 g/dL (ref 2.9–4.4)
Alpha-1-Globulin: 0.3 g/dL (ref 0.0–0.4)
Alpha-2-Globulin: 0.8 g/dL (ref 0.4–1.0)
Beta Globulin: 0.9 g/dL (ref 0.7–1.3)
Gamma Globulin: 0.6 g/dL (ref 0.4–1.8)
Globulin, Total: 2.6 g/dL (ref 2.2–3.9)
Total Protein ELP: 6.4 g/dL (ref 6.0–8.5)

## 2019-05-11 ENCOUNTER — Ambulatory Visit (INDEPENDENT_AMBULATORY_CARE_PROVIDER_SITE_OTHER): Payer: Commercial Managed Care - PPO | Admitting: Licensed Clinical Social Worker

## 2019-05-11 ENCOUNTER — Other Ambulatory Visit: Payer: Self-pay

## 2019-05-11 DIAGNOSIS — F4310 Post-traumatic stress disorder, unspecified: Secondary | ICD-10-CM | POA: Diagnosis not present

## 2019-05-11 DIAGNOSIS — F418 Other specified anxiety disorders: Secondary | ICD-10-CM | POA: Diagnosis not present

## 2019-05-12 ENCOUNTER — Encounter (HOSPITAL_COMMUNITY): Payer: Self-pay | Admitting: Licensed Clinical Social Worker

## 2019-05-12 ENCOUNTER — Ambulatory Visit (HOSPITAL_COMMUNITY)
Admission: RE | Admit: 2019-05-12 | Discharge: 2019-05-12 | Disposition: A | Discharge status: Home / Self Care | Payer: Commercial Managed Care - PPO | Source: Ambulatory Visit | Attending: Cardiology | Admitting: Cardiology

## 2019-05-12 ENCOUNTER — Other Ambulatory Visit: Payer: Self-pay

## 2019-05-12 ENCOUNTER — Ambulatory Visit (HOSPITAL_COMMUNITY): Payer: Commercial Managed Care - PPO | Admitting: Hematology

## 2019-05-12 DIAGNOSIS — R002 Palpitations: Secondary | ICD-10-CM | POA: Insufficient documentation

## 2019-05-12 DIAGNOSIS — R079 Chest pain, unspecified: Secondary | ICD-10-CM

## 2019-05-12 LAB — IMMUNOFIXATION ELECTROPHORESIS
IgA: 106 mg/dL (ref 87–352)
IgG (Immunoglobin G), Serum: 907 mg/dL (ref 586–1602)
IgM (Immunoglobulin M), Srm: 81 mg/dL (ref 26–217)
Total Protein ELP: 6.6 g/dL (ref 6.0–8.5)

## 2019-05-12 NOTE — Progress Notes (Signed)
*  PRELIMINARY RESULTS* Echocardiogram 2D Echocardiogram has been performed.  Samuel Germany 05/12/2019, 12:57 PM

## 2019-05-12 NOTE — Progress Notes (Signed)
Virtual Visit via Video Note  I connected with Martha Espinoza on 05/12/19 at 12:30 PM EST by a video enabled telemedicine application and verified that I am speaking with the correct person using two identifiers.     I discussed the limitations of evaluation and management by telemedicine and the availability of in person appointments. The patient expressed understanding and agreed to proceed.  Type of Therapy: Individual Therapy  Treatment Goals addressed:"PTSD, depression and anxiety. I would like to feel more like myself" . Interventions: CBT and Mindfulness  Summary:Martha Blakelyis a 54y.o. female who presents with PTSD and Depression with Anxiety.  Suicidal/Homicidal: No without intent/plan  Therapist Response:Juliemet with clinician for an individual session. Martha Espinoza about her psychiatric symptoms, her current life events and her homework.Martha Espinoza that she continues to have rough days more often than not. Clinician explored mood and physical health, noting that there is a very strong connection between them. Clinician utilized CBT to explore coping skills for use when depression and anxiety pick up. Clinician offered suggestions of using a mindfulness exercise of holding ice cubes and focusing on the physical sensation of the ice freezing her hands to dissipate anxiety sxs.  Clinician explored interactions with son. Clinician discussed options for getting him removed from the home, as he is verbally abusive, defiant, and poses a risk due to his lack of safety re: COVID protocols.    Plan: Return again in 1-2 weeks.  Diagnosis: Axis I:PTSD and Depression with Anxiety.      I discussed the assessment and treatment plan with the patient. The patient was provided an opportunity to ask questions and all were answered. The patient agreed with the plan and demonstrated an understanding of the instructions.   The patient was advised to call back or seek an  in-person evaluation if the symptoms worsen or if the condition fails to improve as anticipated.  I provided 45 minutes of non-face-to-face time during this encounter.   [name], LCSW

## 2019-05-13 ENCOUNTER — Ambulatory Visit: Payer: Commercial Managed Care - PPO

## 2019-05-19 ENCOUNTER — Other Ambulatory Visit: Payer: Self-pay

## 2019-05-19 ENCOUNTER — Encounter (HOSPITAL_COMMUNITY): Payer: Self-pay | Admitting: Licensed Clinical Social Worker

## 2019-05-19 ENCOUNTER — Ambulatory Visit (INDEPENDENT_AMBULATORY_CARE_PROVIDER_SITE_OTHER): Payer: Commercial Managed Care - PPO | Admitting: Licensed Clinical Social Worker

## 2019-05-19 DIAGNOSIS — F4310 Post-traumatic stress disorder, unspecified: Secondary | ICD-10-CM | POA: Diagnosis not present

## 2019-05-19 DIAGNOSIS — F418 Other specified anxiety disorders: Secondary | ICD-10-CM

## 2019-05-19 NOTE — Progress Notes (Signed)
Virtual Visit via Video Note  I connected with Geneviene M Longest on 05/19/19 at  1:30 PM EST by a video enabled telemedicine application and verified that I am speaking with the correct person using two identifiers.    I discussed the limitations of evaluation and management by telemedicine and the availability of in person appointments. The patient expressed understanding and agreed to proceed.  Type of Therapy: Individual Therapy  Treatment Goals addressed:"PTSD, depression and anxiety. I would like to feel more like myself" . Interventions: Motivational Interviewing  Summary:Makena Baldwinis a 53y.o. female who presents with PTSD and Depression with Anxiety.  Suicidal/Homicidal: No without intent/plan  Therapist Response:Alexeemet with clinician for an individual session. Allyssonshared about her psychiatric symptoms, her current life events and her homework.Anzleyshared that her week has been up and down. Clinician utilized MI OARS to reflect and summarize thoughts and feelings re: family interactions, state of her household, and her concerns about her son. Clinician processed through some coping skills and ways that Kimoni can take more control over the situation. Clinician reflected back and forth thinking about what she really wants. Clinician challenged Blayklee to take some responsibility in her wishes for her son, rather than putting it off on her husband. Clinician followed up on use of coping skills taught at last session.   Areta reported she has not been sleeping well on 1/2 of a 150mg Trazodone at night. Clinician contacted Dr. Kaur to discuss during session. Dr. Kaur agreed for Felecia to take the full tablet at night and will follow up at next session on Friday of this week.   Plan: Return again in 1-2 weeks.  Diagnosis: Axis I:PTSD and Depression with Anxiety.      I discussed the assessment and treatment plan with the patient. The patient was provided an opportunity  to ask questions and all were answered. The patient agreed with the plan and demonstrated an understanding of the instructions.   The patient was advised to call back or seek an in-person evaluation if the symptoms worsen or if the condition fails to improve as anticipated.  I provided 45 minutes of non-face-to-face time during this encounter.   Jessica R Schlosberg, LCSW  

## 2019-05-20 ENCOUNTER — Inpatient Hospital Stay (HOSPITAL_BASED_OUTPATIENT_CLINIC_OR_DEPARTMENT_OTHER): Payer: Commercial Managed Care - PPO | Admitting: Hematology

## 2019-05-20 ENCOUNTER — Other Ambulatory Visit: Payer: Self-pay

## 2019-05-20 DIAGNOSIS — M899 Disorder of bone, unspecified: Secondary | ICD-10-CM | POA: Diagnosis not present

## 2019-05-20 DIAGNOSIS — Z122 Encounter for screening for malignant neoplasm of respiratory organs: Secondary | ICD-10-CM

## 2019-05-20 NOTE — Progress Notes (Signed)
Virtual Visit via Telephone Note  I connected with Martha Espinoza on 05/20/19 at  4:05 PM EST by telephone and verified that I am speaking with the correct person using two identifiers.   I discussed the limitations, risks, security and privacy concerns of performing an evaluation and management service by telephone and the availability of in person appointments. I also discussed with the patient that there may be a patient responsible charge related to this service. The patient expressed understanding and agreed to proceed.   History of Present Illness: She was followed up in our clinic for sclerotic bone lesions which were incidentally found on HRCT of the chest on 09/12/2018 throughout the thoracic spine, largest in T9 and L1 vertebral bodies. These lesions were also present on CT of the abdomen and pelvis from 2017 but were smaller. Bone scan on 10/06/2018 did not show any evidence of metastatic disease.   Observations/Objective: She denies any fevers, night sweats or weight loss. She reportedly had some growths on her vocal cords and is seeing ENT frequently. Denies any new onset back pains. Reports decrease in appetite and energy levels. Low back pain which is chronic reported as 1 out of 10. Also has chronic constipation and intermittent nausea.  Assessment and Plan:  1. Sclerotic bone lesions: -I discussed labs from 05/05/2019. Calcium is nine. Creatinine is 0.95. SPEP is negative. CBC shows hemoglobin 14.4. -Kappa light chains are 15.6, lambda light chains 10 and ratio is normal at 1.56. -Serum immunofixation was unremarkable. -I have recommended follow-up in 4 months with repeat myeloma panel. -We will also do CT of the chest to follow-up on the sclerotic lesions.  2. Smoking history: -CT of the chest on 09/12/2018 did not show any lung lesions. -She smoked 1 pack/day for 25 years, quit for 5 years and currently smoking about half pack per day. She started smoking at age 19. -We will  schedule her for CT low-dose lung cancer screening protocol in June.  Follow Up Instructions: RTC 4 months after the labs and scan.   I discussed the assessment and treatment plan with the patient. The patient was provided an opportunity to ask questions and all were answered. The patient agreed with the plan and demonstrated an understanding of the instructions.   The patient was advised to call back or seek an in-person evaluation if the symptoms worsen or if the condition fails to improve as anticipated.  I provided 11 minutes of non-face-to-face time during this encounter.   Derek Jack, MD

## 2019-05-22 ENCOUNTER — Ambulatory Visit (INDEPENDENT_AMBULATORY_CARE_PROVIDER_SITE_OTHER): Payer: Commercial Managed Care - PPO | Admitting: Psychiatry

## 2019-05-22 ENCOUNTER — Encounter: Payer: Self-pay | Admitting: Psychiatry

## 2019-05-22 ENCOUNTER — Other Ambulatory Visit: Payer: Self-pay

## 2019-05-22 DIAGNOSIS — F4310 Post-traumatic stress disorder, unspecified: Secondary | ICD-10-CM

## 2019-05-22 DIAGNOSIS — F418 Other specified anxiety disorders: Secondary | ICD-10-CM | POA: Diagnosis not present

## 2019-05-22 MED ORDER — TRAZODONE HCL 150 MG PO TABS: 150.0000 mg | ORAL_TABLET | Freq: Every day | ORAL | 0 refills | Status: DC

## 2019-05-22 MED ORDER — BUSPIRONE HCL 15 MG PO TABS: 15.0000 mg | ORAL_TABLET | Freq: Two times a day (BID) | ORAL | 0 refills | Status: DC

## 2019-05-22 MED ORDER — SERTRALINE HCL 50 MG PO TABS: 50.0000 mg | ORAL_TABLET | Freq: Every day | ORAL | 0 refills | Status: DC

## 2019-05-22 MED ORDER — LAMOTRIGINE 100 MG PO TABS: 200.0000 mg | ORAL_TABLET | Freq: Every day | ORAL | 0 refills | Status: DC

## 2019-05-22 NOTE — Progress Notes (Signed)
[facility] MD OP Progress Note  I connected with  Martha Espinoza on 05/22/19 by phone and verified that I am speaking with the correct person using two identifiers.   I discussed the limitations of evaluation and management by phone. The patient expressed understanding and agreed to proceed.    05/22/2019 9:30 AM Martha Espinoza  MRN:  [id number]  Chief Complaint: "I am doing okay."  HPI: Patient reported that she recently got prescribed a walker by her PCP and that has helped her with her mobility.  She has not had any falls recently.  She stated that she started taking Zoloft at bedtime because when she took it in the morning it made her really tired and sleepy.  She also informed that the trazodone dose of half tablet was not effective so she has been taking whole tablet at bedtime which is helped her sleep.  She requested refills for all her medicines. She stated that seeing her therapist Ms. Janett Billow has been helpful.  She spoke in great length about her 52 year old son who is her only child.  She spoke about how he tends to overlook important things and is focused on starting in a very high position and does not want to work his way up from the lower position.  She stated that he recently moved out of the house again however he left most of his belongings and his dog at their home.  She stated that she worries about him and hopes that he would get more serious and life and get a stable job.   Visit Diagnosis:    ICD-10-CM   1. Depression with anxiety  F41.8   2. PTSD (post-traumatic stress disorder)  F43.10     Past Psychiatric History: Depression, anxiety, PTSD  Past Medical History:  Past Medical History:  Diagnosis Date  . Abnormal CT of thoracic spine 09/12/2018   Noncontrast.  Increased in number and TINY increase in size of tiny, patchy sclerotic T spine lesions compared to 2018 CT.  Hem/onc summer 2020->w/u->suspected benign bone islands->survellance imaging in 6-12 mo planned.  .  Allergic rhinitis   . Arthritis   . Asthma   . Bipolar 1 disorder ([place])   . Chronic pain syndrome    chronic low back pain (Pain mgmt= Dr. [name]).  MRI showed no signif disc dz, only showed some facet arthropathy at L4-5, with joint diastasis at L4-5--plan as of 11/07/16 neurosurg eval is bilat facet injections. (chronic LBP+ fibromyalgia)  . Colitis due to Clostridium difficile 2001  . COPD (chronic obstructive pulmonary disease) ([place])    PFTs 04/2016 showed no sign of copd or asthma, plus she actually had worse FEV1 after taking albuterol.  However, as of 06/2018 she has recent hx of improvement in sx's after taking albuterol.  LONG TIME SMOKER, won't quit.  Intolerant to or cannot afford: pulmicort, QVAR, advair, symbicort, flovent, bevespi, and spiriva, anoro,stiolto, and trelegy. Pulmicort trial per pulm 12/2018  . Depression   . Fibromyalgia syndrome   . GAD (generalized anxiety disorder)   . GERD (gastroesophageal reflux disease)   . Hepatic hemangioma   . Hepatic steatosis 06/2016   Noted incidentally on CT chest  . History of Salmonella gastroenteritis   . Hoarseness    chronic: laryngoscopy showed edema of vocal folds and R focal fold leukoplakia->>NEEDS TO QUIT SMOKING! Rpt laryngoscopy 03/17/19 showed the leukoplakia spot smaller->to be followed with serial exams by ent.  . Hyperlipidemia    a. Noted 02/2014.  Marland Kitchen  Hypertension   . IBS (irritable bowel syndrome)   . Iron deficiency anemia 05/2014   Per pt it is not due to vaginal blood loss; hemoccults sent to pt in mail 413/16.  . Lumbar spondylosis    Mild, mainly focused at L4-5 and L5-S1.  MRI L spine 09/09/17-->mild degenerative disc disease and facet arthrosis without any nerve root encroachment, no spinal stenosis.  . Microscopic hematuria    Intermittent (no w/u done yet, as of 03/2014)  . Migraine headache   . Morbid obesity ([place])   . Multiple lipomas 09/2016   Left upper arm--very small ones.  . Nocturnal hypoxia  09/18/2017   Overnight oximetry -->qualified: ordered 2L oxygen during sleep.  . Obesity hypoventilation syndrome ([place])   . Pain in both lower legs 2018-2019   Pain and numbness bilat LLs.  NCS/EMGs findings suggestive of S1 radiculopathy bilat: sx's + NCS findings suggestive of neurogenic claudication/spinal stenosis.  ? contribution of venous reflux?-->"idiopathic peripheral neuropathy, progressive->neurol/pain mgmt  . Peripheral edema Fall 2018   R>L, non-pitting for the most part--venous doppler neg for DVT 01/2017.  Saw Dr. Bridgett Larsson (Vasc) 04/12/17 and u/s showed some venous reflux dz but he felt her sx's in legs were NOT due to her venous insufficiency OR PAD.  Thigh high comp stockings rx'd.  . Polycystic ovarian syndrome   . Pruritic dermatitis 2015/2016   +Scabies [facility] at [facility] 07/2014. (Primarily pruritic skin, but eventually a subtle rash as well)  . PTSD (post-traumatic stress disorder)   . Raynaud's phenomenon 2019  . Rectal cancer ([place])    a. Followed by Dr. Gala Romney, dx 2000-2001.  Surveillance colonoscopy planned as of 11/2018 GI f/u.  . Right shoulder pain 2017   Murphy/Wainer: RC bursitis + RC tear (MRI)--injection trial is plan per pt report 05/04/16.  . Vitamin D deficiency   . Wheezing    Most of her resp symptoms are c/w upper airway etiology (repeated PFTs ->no obstructive dz). Pulm referred her to ENT 12/2018.    Past Surgical History:  Procedure Laterality Date  . ABI's complete Bilateral 09/30/2017   Normal ABIs.  Great toe pressures adequate for wound healing.  Femoral artery waveforms triphasic (normal).  . CARDIOVASCULAR STRESS TEST  03/24/14   Lexscan MIBI: mild anterior ischemia?  Cardiac CT angiogram recommended/done.  . CHOLECYSTECTOMY    . COLONOSCOPY  05/05/2007   adenoma  . coronary CT angio  03/29/14   Two vessel dz/moderate stenosis of mid LAD and proximal RCA; cath recommended.  Marland Kitchen DILATION AND CURETTAGE OF UTERUS     for vaginal bleeding  . JOINT  REPLACEMENT    . laryngoscopy  02/11/2019   (Done b/c of hoarseness and hx of wheezing"Reinke's edema of vocal folds, right vocal cord leukoplakia-->plan for bx of leukoplakia->NEEDS TO QUIT SMOKING!  . LEFT HEART CATHETERIZATION WITH CORONARY ANGIOGRAM N/A 03/31/2014   Normal coronaries, EF 0000000, diastolic dysfunction.  Procedure: LEFT HEART CATHETERIZATION WITH CORONARY ANGIOGRAM;  Surgeon: Sinclair Grooms, MD;  Location: [facility] CATH LAB;  Service: Cardiovascular;  Laterality: N/A;  . LOWER EXT VENOUS DOPPLERS Bilateral 02/08/2017   NEG for DVT (bilat)  . NCS/EMG  10/03/2017   S1 radiculopathy bilat  . Overnight oximetry testing  09/2017   qualified for oxygen supplementation during sleep  . PFTs  04/2016   No sign of COPD or asthma; FEV 1 worse after albut.  . rectal mss  age 58   High-grade rectal adenoma removed from rectum   .  TRANSTHORACIC ECHOCARDIOGRAM  03/23/14; 10/21/17; 05/12/19   2015 Normal.  2019: mild LVH, EF 65-70%, study technically inadequate to assess diastolic function, valves were fine, wall motion normal. 2021 EF 60-65%-->all normal.    Family Psychiatric History: See below  Family History:  Family History  Problem Relation Age of Onset  . Colon cancer Father 67  . Cancer Father   . Heart disease Mother   . Hypertension Mother   . Hyperlipidemia Mother   . Mental illness Mother   . Diabetes Mother   . Heart attack Mother   . Thyroid disease Mother   . Anxiety disorder Mother   . Depression Mother   . Colon cancer Maternal Grandfather   . Bipolar disorder Son   . Stroke Neg Hx     Social History:  Social History   Socioeconomic History  . Marital status: Married    Spouse name: tracey  . Number of children: 1  . Years of education: Not on file  . Highest education level: 9th grade  Occupational History  . Occupation: own Games developer: [employer]  Tobacco Use  . Smoking status: Former Smoker    Packs/day: 1.00     Years: 18.00    Pack years: 18.00    Types: Cigarettes  . Smokeless tobacco: Never Used  . Tobacco comment: 25 years  Substance and Sexual Activity  . Alcohol use: No    Alcohol/week: 0.0 standard drinks  . Drug use: No  . Sexual activity: Not Currently    Birth control/protection: None, Surgical  Other Topics Concern  . Not on file  Social History Narrative   A friend   Social Determinants of Health   Financial Resource Strain: Low Risk   . Difficulty of Paying Living Expenses: Not hard at all  Food Insecurity: No Food Insecurity  . Worried About Charity fundraiser in the Last Year: Never true  . Ran Out of Food in the Last Year: Never true  Transportation Needs: No Transportation Needs  . Lack of Transportation (Medical): No  . Lack of Transportation (Non-Medical): No  Physical Activity: Inactive  . Days of Exercise per Week: 0 days  . Minutes of Exercise per Session: 0 min  Stress: Stress Concern Present  . Feeling of Stress : Very much  Social Connections: Moderately Isolated  . Frequency of Communication with Friends and Family: Once a week  . Frequency of Social Gatherings with Friends and Family: Once a week  . Attends Religious Services: Never  . Active Member of Clubs or Organizations: No  . Attends Archivist Meetings: Never  . Marital Status: Married    Allergies:  Allergies  Allergen Reactions  . Contrast Media [Iodinated Diagnostic Agents] Anaphylaxis    Needs 13-hour prep  . Shellfish Allergy Anaphylaxis  . Codeine Nausea And Vomiting and Other (See Comments)    Hallucinations, Also sees people that are not there   . Furosemide Rash    Rash, soB.  Marland Kitchen Anoro Ellipta [Umeclidinium-Vilanterol] Other (See Comments)    Pt states it caused blisters.  . Flovent Diskus [Fluticasone Propionate (Inhal)]     headache  . Ivermectin Nausea And Vomiting    N/V and rash  . Spiriva Handihaler [Tiotropium Bromide Monohydrate] Other (See Comments)     Severe headaches  . Symbicort [Budesonide-Formoterol Fumarate]     headache  . Betadine [Povidone Iodine] Rash  . Latex Hives  . Lipitor [Atorvastatin] Itching  .  Permethrin Itching  . Stiolto Respimat [Tiotropium Bromide-Olodaterol] Other (See Comments)    Mouth ulcers  . Tape Dermatitis    Paper tape and clear  . Trelegy Ellipta [Fluticasone-Umeclidin-Vilant] Other (See Comments)    headaches    Metabolic Disorder Labs: Lab Results  Component Value Date   HGBA1C 5.8 09/24/2018   MPG 123 (H) 03/23/2014   No results found for: PROLACTIN Lab Results  Component Value Date   CHOL 226 (H) 02/06/2019   TRIG 276 (H) 02/06/2019   HDL 40 (L) 02/06/2019   CHOLHDL 5.7 (H) 02/06/2019   VLDL 64.8 (H) 12/04/2018   LDLCALC 144 (H) 02/06/2019   LDLCALC 169 (H) 03/24/2014   Lab Results  Component Value Date   TSH 1.12 10/08/2016   TSH 0.39 10/24/2015    Therapeutic Level Labs: No results found for: LITHIUM No results found for: VALPROATE No components found for:  CBMZ  Current Medications: Current Outpatient Medications  Medication Sig Dispense Refill  . albuterol (PROVENTIL) (2.5 MG/3ML) 0.083% nebulizer solution Take 3 mLs (2.5 mg total) by nebulization every 6 (six) hours as needed for wheezing or shortness of breath. (Patient not taking: Reported on 05/20/2019) 75 mL 6  . albuterol (VENTOLIN HFA) 108 (90 Base) MCG/ACT inhaler INHALE 1 TO 2 PUFFS BY MOUTH EVERY 4 HOURS AS NEEDED (Patient taking differently: Inhale 1-2 puffs into the lungs 4 (four) times daily. ) 16 g 11  . AMITIZA 24 MCG capsule Take 24 mcg by mouth 2 (two) times daily.  1  . aspirin 81 MG chewable tablet Chew 1 tablet (81 mg total) by mouth daily. (Patient taking differently: Chew 81 mg by mouth every evening. ) 14 tablet 0  . azelastine (ASTELIN) 0.1 % nasal spray Place 2 sprays into both nostrils 2 (two) times daily. 30 mL 6  . budesonide (PULMICORT) 0.5 MG/2ML nebulizer solution Take 2 mLs by nebulization 2  (two) times daily.    . busPIRone (BUSPAR) 15 MG tablet Take 1 tablet (15 mg total) by mouth 2 (two) times daily. 180 tablet 0  . clonazePAM (KLONOPIN) 0.5 MG tablet TAKE 1 TABLET BY MOUTH DURING THE DAY AS NEEDED AND 2 TABLETS AT BEDTIME (Patient taking differently: Take 0.5-1 mg by mouth See admin instructions. TAKE 1 TABLET (0.5 MG) BY MOUTH DAILY IN THE MORNING & TAKE 2 TABLETS (1 MG) BY MOUTH AT BEDTIME.) 90 tablet 5  . cyclobenzaprine (FLEXERIL) 10 MG tablet TAKE ONE TABLET BY MOUTH EVERY 6 HOURS AS NEEDED (SPASM RELATED TO HEADACHES) (Patient taking differently: Take 10 mg by mouth 3 (three) times daily. ) 42 tablet 5  . diphenhydrAMINE (BENADRYL) 25 MG tablet Take 1 tablet (25 mg total) by mouth every 6 (six) hours. (Patient not taking: Reported on 05/20/2019) 20 tablet 0  . EQ ALLERGY RELIEF, CETIRIZINE, 10 MG tablet Take 1 tablet by mouth once daily (Patient taking differently: Take 10 mg by mouth at bedtime. ) 90 tablet 1  . ferrous sulfate 325 (65 FE) MG EC tablet Take 325 mg by mouth every evening.    . fluticasone (FLONASE) 50 MCG/ACT nasal spray Place 2 sprays into both nostrils daily as needed for allergies.    . formoterol (PERFOROMIST) 20 MCG/2ML nebulizer solution Take 2 mLs (20 mcg total) by nebulization 2 (two) times daily. 120 mL 11  . lamoTRIgine (LAMICTAL) 100 MG tablet Take 2 tablets (200 mg total) by mouth at bedtime. 90 tablet 0  . losartan (COZAAR) 50 MG tablet Take 1 tablet  by mouth once daily (Patient taking differently: Take 50 mg by mouth every evening. ) 90 tablet 1  . meloxicam (MOBIC) 15 MG tablet Take 15 mg by mouth every evening.   1  . metoprolol tartrate (LOPRESSOR) 50 MG tablet Take 1 tablet by mouth twice daily (Patient taking differently: Take 50 mg by mouth 2 (two) times daily. ) 180 tablet 1  . Naldemedine Tosylate (SYMPROIC) 0.2 MG TABS Take 0.2 mg by mouth at bedtime.     . nitroGLYCERIN (NITROSTAT) 0.4 MG SL tablet DISSOLVE ONE TABLET UNDER THE TONGUE EVERY  5 MINUTES AS NEEDED FOR CHEST PAIN.  DO NOT EXCEED A TOTAL OF 3 DOSES IN 15 MINUTES (Patient not taking: No sig reported) 10 tablet 0  . oxyCODONE-acetaminophen (PERCOCET) 10-325 MG tablet Take 1 tablet by mouth 4 (four) times daily.     . pantoprazole (PROTONIX) 40 MG tablet 40 mg 2 (two) times daily.     . Potassium (POTASSIMIN PO) Take 2 tablets by mouth at bedtime.    . pregabalin (LYRICA) 200 MG capsule Take 200 mg by mouth daily.     . promethazine (PHENERGAN) 25 MG tablet 1 tab po q6h prn nausea (Patient not taking: Reported on 05/20/2019) 30 tablet 3  . rOPINIRole (REQUIP) 0.25 MG tablet Take 3 tablets (0.75 mg total) by mouth at bedtime. 270 tablet 0  . rosuvastatin (CRESTOR) 10 MG tablet Take 1 tablet by mouth once daily 30 tablet 0  . sertraline (ZOLOFT) 50 MG tablet Take 1 tablet (50 mg total) by mouth daily. 90 tablet 0  . traZODone (DESYREL) 150 MG tablet Take half tablet as needed for sleep (Patient not taking: Reported on 05/20/2019) 45 tablet 1  . varenicline (CHANTIX CONTINUING MONTH PAK) 1 MG tablet Take 1 tablet (1 mg total) by mouth 2 (two) times daily. (Patient not taking: Reported on 05/20/2019) 60 tablet 1  . varenicline (CHANTIX STARTING MONTH PAK) 0.5 MG X 11 & 1 MG X 42 tablet Take one 0.5 mg tablet by mouth once daily for 3 days, then increase to one 0.5 mg tablet twice daily for 4 days, then increase to one 1 mg tablet twice daily. (Patient not taking: Reported on 05/20/2019) 53 tablet 0   No current facility-administered medications for this visit.     Musculoskeletal: Strength & Muscle Tone: unable to assess due to telemed visit Gait & Station: unable to assess due to telemed visit Patient leans: unable to assess due to telemed visit  Psychiatric Specialty Exam: Review of Systems  There were no vitals taken for this visit.There is no height or weight on file to calculate BMI.  General Appearance: unable to assess due to phone visit  Eye Contact:  unable to assess  due to phone visit  Speech:  Clear and Coherent and Normal Rate  Volume:  Normal  Mood:  Euthymic  Affect:  Appropriate  Thought Process:  Goal Directed, Linear and Descriptions of Associations: Intact  Orientation:  Full (Time, Place, and Person)  Thought Content: Logical   Suicidal Thoughts:  No  Homicidal Thoughts:  No  Memory:  Recent;   Good Remote;   Good  Judgement:  Fair  Insight:  Fair  Psychomotor Activity:  Normal  Concentration:  Concentration: Good and Attention Span: Good  Recall:  Good  Fund of Knowledge: Fair  Language: Good  Akathisia:  Negative  Handed:  Right  AIMS (if indicated): not done   Assets:  Communication Skills Desire for Improvement  Financial Resources/Insurance Housing Social Support  ADL's:  Intact  Cognition: WNL  Sleep:  Fair   Screenings: PHQ2-9     Office Visit from 08/16/2017 in [facility] Office Visit from 07/17/2017 in [facility]  PHQ-2 Total Score  2  6  PHQ-9 Total Score  18  18       Assessment and Plan: Patient reported doing fairly well on her regimen.  She has been dealing with several different issues and also with frequent falls.  She is worried about her young son and his personal decisions.   1. PTSD (post-traumatic stress disorder)  - sertraline (ZOLOFT) 50 MG tablet; Take 1 tablet (50 mg total) by mouth daily.  Dispense: 90 tablet; Refill: 0 - busPIRone (BUSPAR) 15 MG tablet; Take 1 tablet (15 mg total) by mouth 2 (two) times daily.  Dispense: 180 tablet; Refill: 0  2. Depression with anxiety  - sertraline (ZOLOFT) 50 MG tablet; Take 1 tablet (50 mg total) by mouth daily.  Dispense: 90 tablet; Refill: 0 - traZODone (DESYREL) 150 MG tablet; Take one tablet at bedtime for sleep  Dispense: 45 tablet; Refill: 0 - lamoTRIgine (LAMICTAL) 100 MG tablet; Take 2 tablets (200 mg total) by mouth at bedtime.  Dispense: 90 tablet; Refill: 0 - busPIRone (BUSPAR) 15 MG tablet; Take 1  tablet (15 mg total) by mouth 2 (two) times daily.  Dispense: 180 tablet; Refill: 0  Continue same regimen except increase in Trazodone to 150 mg HS. Continue individual therapy. Follow-up in 2 months.  [place] Crane, MD 05/22/2019, 9:30 AM

## 2019-05-25 ENCOUNTER — Encounter: Payer: Self-pay | Admitting: Family Medicine

## 2019-05-25 ENCOUNTER — Ambulatory Visit (HOSPITAL_COMMUNITY): Payer: Commercial Managed Care - PPO | Admitting: Licensed Clinical Social Worker

## 2019-05-25 ENCOUNTER — Other Ambulatory Visit: Payer: Self-pay

## 2019-05-27 ENCOUNTER — Other Ambulatory Visit: Payer: Self-pay

## 2019-05-27 ENCOUNTER — Ambulatory Visit (INDEPENDENT_AMBULATORY_CARE_PROVIDER_SITE_OTHER): Payer: Commercial Managed Care - PPO | Admitting: Family Medicine

## 2019-05-27 DIAGNOSIS — E7800 Pure hypercholesterolemia, unspecified: Secondary | ICD-10-CM

## 2019-05-28 LAB — LIPID PANEL
Cholesterol: 186 mg/dL (ref 0–200)
HDL: 33.8 mg/dL — ABNORMAL LOW (ref >39.00)
NonHDL: 152.58
Total CHOL/HDL Ratio: 6
Triglycerides: 264 mg/dL — ABNORMAL HIGH (ref 0.0–149.0)
VLDL: 52.8 mg/dL — ABNORMAL HIGH (ref 0.0–40.0)

## 2019-05-28 LAB — ALT: ALT: 7 U/L (ref 0–35)

## 2019-05-28 LAB — AST: AST: 7 U/L (ref 0–37)

## 2019-05-28 LAB — LDL CHOLESTEROL, DIRECT: Direct LDL: 116 mg/dL

## 2019-05-29 ENCOUNTER — Other Ambulatory Visit: Payer: Self-pay

## 2019-05-29 DIAGNOSIS — E782 Mixed hyperlipidemia: Secondary | ICD-10-CM

## 2019-05-29 MED ORDER — ROSUVASTATIN CALCIUM 20 MG PO TABS: 20.0000 mg | ORAL_TABLET | Freq: Every day | ORAL | 3 refills | Status: DC

## 2019-06-01 ENCOUNTER — Other Ambulatory Visit: Payer: Self-pay | Admitting: Family Medicine

## 2019-06-01 NOTE — Telephone Encounter (Signed)
Requesting: Clonazepam Contract:09/24/18 UDS: n/a Last Visit:02/18/19 Next Visit: advised to f/u 3 months Last Refill:10/27/18(90,5)  Please Advise. Medication pending

## 2019-06-02 ENCOUNTER — Ambulatory Visit (INDEPENDENT_AMBULATORY_CARE_PROVIDER_SITE_OTHER): Payer: Commercial Managed Care - PPO | Admitting: Licensed Clinical Social Worker

## 2019-06-02 ENCOUNTER — Other Ambulatory Visit: Payer: Self-pay

## 2019-06-02 ENCOUNTER — Encounter (HOSPITAL_COMMUNITY): Payer: Self-pay | Admitting: Licensed Clinical Social Worker

## 2019-06-02 DIAGNOSIS — F4310 Post-traumatic stress disorder, unspecified: Secondary | ICD-10-CM | POA: Diagnosis not present

## 2019-06-02 DIAGNOSIS — F418 Other specified anxiety disorders: Secondary | ICD-10-CM | POA: Diagnosis not present

## 2019-06-02 NOTE — Progress Notes (Signed)
Virtual Visit via Video Note  I connected with Martha Espinoza on 06/02/19 at 12:30 PM EST by a video enabled telemedicine application and verified that I am speaking with the correct person using two identifiers.    I discussed the limitations of evaluation and management by telemedicine and the availability of in person appointments. The patient expressed understanding and agreed to proceed.  Type of Therapy: Individual Therapy  Treatment Goals addressed:"PTSD, depression and anxiety. I would like to feel more like myself" . Interventions: Motivational Interviewing  Summary:Martha Blakelyis a 54y.o. female who presents with PTSD and Depression with Anxiety.  Suicidal/Homicidal: No without intent/plan  Therapist Response:[place] with clinician for an individual session. Martha Espinoza about her psychiatric symptoms, her current life events and her homework.Martha Espinoza that this week has been a little rough due to ongoing vertigo issues. Clinician explored treatment options and noted that there is not much she can do outside of the exercises taught by the physical therapist. Clinician discussed mood and interactions at home with family. Clinician identified and processed through a lot of concerns about granddaughter. Clinician provided supportive counseling and MI OARS to reflect and summarize thoughts and feelings. Clinician discussed options and different routes to spending time with granddaughter, even if son is not living in her home. Clinician also identified the value of grandmothers for their grandchildren in providing safety, security, confidence.  Plan: Return again in 1-2 weeks.  Diagnosis: Axis I:PTSD and Depression with Anxiety.     I discussed the assessment and treatment plan with the patient. The patient was provided an opportunity to ask questions and all were answered. The patient agreed with the plan and demonstrated an understanding of the instructions.    The patient was advised to call back or seek an in-person evaluation if the symptoms worsen or if the condition fails to improve as anticipated.  I provided 45 minutes of non-face-to-face time during this encounter.   [name], LCSW

## 2019-06-08 ENCOUNTER — Encounter (HOSPITAL_COMMUNITY): Payer: Self-pay | Admitting: Licensed Clinical Social Worker

## 2019-06-08 ENCOUNTER — Ambulatory Visit (INDEPENDENT_AMBULATORY_CARE_PROVIDER_SITE_OTHER): Payer: Commercial Managed Care - PPO | Admitting: Licensed Clinical Social Worker

## 2019-06-08 ENCOUNTER — Other Ambulatory Visit: Payer: Self-pay

## 2019-06-08 DIAGNOSIS — F4310 Post-traumatic stress disorder, unspecified: Secondary | ICD-10-CM

## 2019-06-08 NOTE — Progress Notes (Signed)
Virtual Visit via Video Note  I connected with Martha Espinoza on 06/08/19 at 12:30 PM EDT by a video enabled telemedicine application and verified that I am speaking with the correct person using two identifiers.     I discussed the limitations of evaluation and management by telemedicine and the availability of in person appointments. The patient expressed understanding and agreed to proceed.  Type of Therapy: Individual Therapy  Treatment Goals addressed:"PTSD, depression and anxiety. I would like to feel more like myself" . Interventions:Motivational Interviewing and CBT  Summary:Martha Blakelyis a 54y.o. female who presents with PTSD and Depression with Anxiety.  Suicidal/Homicidal: No without intent/plan  Therapist Response:Juliemet with clinician for an individual session. Martha Espinoza about her psychiatric symptoms, her current life events and her homework.Juliereported that she has not been herself this week, due to trauma triggers. Clinician processed the incident and the trauma that was triggered. Clinician utilized MI OARS to reflect and affirm her experiences, as well as validated her feelings about the experience. Clinician identified the changes in her reaction to the triggers and provided some supportive feedback about why these conversations brought up so much for her. Clinician provided CBT psycheoducation about the body's reaction to trauma and noted that those feelings and memories about her trauma have been waiting to be released for a long time. Clinician provided time and space for Martha Espinoza to discuss this trauma, as well as other traumas that happened when she was a child.   Plan: Return again in 1-2 weeks.  Diagnosis: Axis I:PTSD and Depression with Anxiety.     I discussed the assessment and treatment plan with the patient. The patient was provided an opportunity to ask questions and all were answered. The patient agreed with the plan and  demonstrated an understanding of the instructions.   The patient was advised to call back or seek an in-person evaluation if the symptoms worsen or if the condition fails to improve as anticipated.  I provided 60 minutes of non-face-to-face time during this encounter.   [name], LCSW

## 2019-06-10 ENCOUNTER — Telehealth (HOSPITAL_COMMUNITY): Payer: Self-pay | Admitting: Licensed Clinical Social Worker

## 2019-06-10 NOTE — Telephone Encounter (Signed)
06/10/19 8:46am  I called and left the patient a msg to call me.

## 2019-06-17 ENCOUNTER — Encounter: Payer: Self-pay | Admitting: Family Medicine

## 2019-06-18 ENCOUNTER — Ambulatory Visit (INDEPENDENT_AMBULATORY_CARE_PROVIDER_SITE_OTHER): Payer: Commercial Managed Care - PPO | Admitting: Licensed Clinical Social Worker

## 2019-06-18 ENCOUNTER — Encounter (HOSPITAL_COMMUNITY): Payer: Self-pay | Admitting: Licensed Clinical Social Worker

## 2019-06-18 ENCOUNTER — Other Ambulatory Visit: Payer: Self-pay

## 2019-06-18 DIAGNOSIS — Z79891 Long term (current) use of opiate analgesic: Secondary | ICD-10-CM | POA: Insufficient documentation

## 2019-06-18 DIAGNOSIS — G609 Hereditary and idiopathic neuropathy, unspecified: Secondary | ICD-10-CM | POA: Insufficient documentation

## 2019-06-18 DIAGNOSIS — F418 Other specified anxiety disorders: Secondary | ICD-10-CM | POA: Diagnosis not present

## 2019-06-18 DIAGNOSIS — F4310 Post-traumatic stress disorder, unspecified: Secondary | ICD-10-CM

## 2019-06-18 DIAGNOSIS — M797 Fibromyalgia: Secondary | ICD-10-CM | POA: Insufficient documentation

## 2019-06-18 DIAGNOSIS — M79606 Pain in leg, unspecified: Secondary | ICD-10-CM | POA: Insufficient documentation

## 2019-06-18 DIAGNOSIS — M25519 Pain in unspecified shoulder: Secondary | ICD-10-CM | POA: Insufficient documentation

## 2019-06-18 NOTE — Progress Notes (Signed)
Virtual Visit via Video Note  I connected with Martha Espinoza on 06/18/19 at 12:30 PM EDT by a video enabled telemedicine application and verified that I am speaking with the correct person using two identifiers.     I discussed the limitations of evaluation and management by telemedicine and the availability of in person appointments. The patient expressed understanding and agreed to proceed.  Type of Therapy: Individual Therapy   Treatment Goals addressed: "PTSD, depression and anxiety. I would like to feel more like myself" .  Interventions: Motivational Interviewing and CBT   Summary: Martha Espinoza is a 54 y.o. female who presents with PTSD and Depression with Anxiety.   Suicidal/Homicidal: No without intent/plan   Therapist Response: Almyra Free met with clinician for an individual session. Martha Espinoza shared about her psychiatric symptoms, her current life events and her homework. Martha Espinoza reported that she continues to fall down and will be going to the doctor soon to figure out what is going on with her legs, back, and nerves. Clinician processed experiences with falling and noted the ongoing importance of her not being alone at home without help. Clinician discussed mood and interactions with family members. Clinician explored PTSD sxs and continued to process her traumatic experiences using CBT.    Plan: Return again in 1-2 weeks.   Diagnosis: Axis I: PTSD and Depression with Anxiety.   I discussed the assessment and treatment plan with the patient. The patient was provided an opportunity to ask questions and all were answered. The patient agreed with the plan and demonstrated an understanding of the instructions.   The patient was advised to call back or seek an in-person evaluation if the symptoms worsen or if the condition fails to improve as anticipated.  I provided 45 minutes of non-face-to-face time during this encounter.   Martha Curling, LCSW

## 2019-06-29 DIAGNOSIS — R2689 Other abnormalities of gait and mobility: Secondary | ICD-10-CM | POA: Insufficient documentation

## 2019-06-30 ENCOUNTER — Other Ambulatory Visit: Payer: Self-pay | Admitting: Family Medicine

## 2019-07-02 ENCOUNTER — Encounter (HOSPITAL_COMMUNITY): Payer: Self-pay | Admitting: Licensed Clinical Social Worker

## 2019-07-02 ENCOUNTER — Other Ambulatory Visit: Payer: Self-pay

## 2019-07-02 ENCOUNTER — Ambulatory Visit (INDEPENDENT_AMBULATORY_CARE_PROVIDER_SITE_OTHER): Payer: Commercial Managed Care - PPO | Admitting: Licensed Clinical Social Worker

## 2019-07-02 DIAGNOSIS — F4310 Post-traumatic stress disorder, unspecified: Secondary | ICD-10-CM | POA: Diagnosis not present

## 2019-07-02 DIAGNOSIS — F418 Other specified anxiety disorders: Secondary | ICD-10-CM

## 2019-07-02 NOTE — Progress Notes (Signed)
Virtual Visit via Video Note  I connected with Martha Espinoza on 07/02/19 at  2:30 PM EDT by a video enabled telemedicine application and verified that I am speaking with the correct person using two identifiers.    I discussed the limitations of evaluation and management by telemedicine and the availability of in person appointments. The patient expressed understanding and agreed to proceed.   Type of Therapy: Individual Therapy   Treatment Goals addressed: "PTSD, depression and anxiety. I would like to feel more like myself" .  Interventions: Motivational Interviewing and CBT   Summary: Martha Espinoza is a 54 y.o. female who presents with PTSD and Depression with Anxiety.   Suicidal/Homicidal: No without intent/plan   Therapist Response: Almyra Free met with clinician for an individual session. Ausha shared about her psychiatric symptoms, her current life events and her homework. Clinician explored mood, PTSD sxs, and current triggers. Clinician processed through thoughts and feelings about son and his attitude toward her, which triggers PTSD sxs. Clinician utilized MI OARS to reflect and summarize thoughts and feelings. Clinician also explored options for getting son "evicted" from the home. Clinician utilized CBT pros and cons to identify options and possible costs that would occur if he was told to leave. Clinician discussed Martha Espinoza's concerns about her previous dx of "multiple personalities". Clinician explored the sxs and noted that none of these have been presented so far in session. Clinician provided psychoeducation about Bipolar Disorder, noting mood shifts and "acting like a different person". However, the dx of DID will not be added at this time, as these sxs have not presented or have not been reported.     Plan: Return again in 1-2 weeks.   Diagnosis: Axis I: PTSD and Depression with Anxiety.       I discussed the assessment and treatment plan with the patient. The patient was  provided an opportunity to ask questions and all were answered. The patient agreed with the plan and demonstrated an understanding of the instructions.   The patient was advised to call back or seek an in-person evaluation if the symptoms worsen or if the condition fails to improve as anticipated.  I provided 60 minutes of non-face-to-face time during this encounter.   Martha Curling, LCSW

## 2019-07-06 ENCOUNTER — Telehealth: Payer: Self-pay | Admitting: Psychiatry

## 2019-07-06 ENCOUNTER — Other Ambulatory Visit: Payer: Self-pay | Admitting: Neurology

## 2019-07-06 DIAGNOSIS — R413 Other amnesia: Secondary | ICD-10-CM

## 2019-07-06 DIAGNOSIS — F418 Other specified anxiety disorders: Secondary | ICD-10-CM

## 2019-07-06 MED ORDER — LAMOTRIGINE 100 MG PO TABS: ORAL_TABLET | ORAL | 0 refills | Status: DC

## 2019-07-06 NOTE — Telephone Encounter (Signed)
Refill for Lamictal sent to pharmacy.

## 2019-07-07 ENCOUNTER — Emergency Department (HOSPITAL_COMMUNITY): Payer: Commercial Managed Care - PPO

## 2019-07-07 ENCOUNTER — Telehealth: Payer: Self-pay

## 2019-07-07 ENCOUNTER — Encounter (HOSPITAL_COMMUNITY): Payer: Self-pay | Admitting: Emergency Medicine

## 2019-07-07 ENCOUNTER — Encounter: Payer: Commercial Managed Care - PPO | Admitting: Family Medicine

## 2019-07-07 ENCOUNTER — Other Ambulatory Visit: Payer: Self-pay

## 2019-07-07 ENCOUNTER — Emergency Department (HOSPITAL_COMMUNITY)
Admission: EM | Admit: 2019-07-07 | Discharge: 2019-07-08 | Discharge status: Against Medical Advice | Payer: Commercial Managed Care - PPO | Attending: Emergency Medicine | Admitting: Emergency Medicine

## 2019-07-07 DIAGNOSIS — F1910 Other psychoactive substance abuse, uncomplicated: Secondary | ICD-10-CM | POA: Insufficient documentation

## 2019-07-07 DIAGNOSIS — Z87891 Personal history of nicotine dependence: Secondary | ICD-10-CM | POA: Diagnosis not present

## 2019-07-07 DIAGNOSIS — Z0289 Encounter for other administrative examinations: Secondary | ICD-10-CM

## 2019-07-07 DIAGNOSIS — R296 Repeated falls: Secondary | ICD-10-CM | POA: Diagnosis not present

## 2019-07-07 DIAGNOSIS — Z7982 Long term (current) use of aspirin: Secondary | ICD-10-CM | POA: Insufficient documentation

## 2019-07-07 DIAGNOSIS — Z5329 Procedure and treatment not carried out because of patient's decision for other reasons: Secondary | ICD-10-CM | POA: Insufficient documentation

## 2019-07-07 DIAGNOSIS — I10 Essential (primary) hypertension: Secondary | ICD-10-CM | POA: Insufficient documentation

## 2019-07-07 DIAGNOSIS — Z79899 Other long term (current) drug therapy: Secondary | ICD-10-CM | POA: Insufficient documentation

## 2019-07-07 DIAGNOSIS — W19XXXA Unspecified fall, initial encounter: Secondary | ICD-10-CM

## 2019-07-07 DIAGNOSIS — R4182 Altered mental status, unspecified: Secondary | ICD-10-CM | POA: Diagnosis present

## 2019-07-07 LAB — CBC WITH DIFFERENTIAL/PLATELET
Abs Immature Granulocytes: 0.03 10*3/uL (ref 0.00–0.07)
Basophils Absolute: 0 10*3/uL (ref 0.0–0.1)
Basophils Relative: 0 %
Eosinophils Absolute: 0.2 10*3/uL (ref 0.0–0.5)
Eosinophils Relative: 2 %
HCT: 44.6 % (ref 36.0–46.0)
Hemoglobin: 13.5 g/dL (ref 12.0–15.0)
Immature Granulocytes: 0 %
Lymphocytes Relative: 25 %
Lymphs Abs: 2.4 10*3/uL (ref 0.7–4.0)
MCH: 29.2 pg (ref 26.0–34.0)
MCHC: 30.3 g/dL (ref 30.0–36.0)
MCV: 96.5 fL (ref 80.0–100.0)
Monocytes Absolute: 0.4 10*3/uL (ref 0.1–1.0)
Monocytes Relative: 4 %
Neutro Abs: 6.7 10*3/uL (ref 1.7–7.7)
Neutrophils Relative %: 69 %
Platelets: 219 10*3/uL (ref 150–400)
RBC: 4.62 MIL/uL (ref 3.87–5.11)
RDW: 15 % (ref 11.5–15.5)
WBC: 9.8 10*3/uL (ref 4.0–10.5)
nRBC: 0 % (ref 0.0–0.2)

## 2019-07-07 LAB — COMPREHENSIVE METABOLIC PANEL
ALT: 13 U/L (ref 0–44)
AST: 13 U/L — ABNORMAL LOW (ref 15–41)
Albumin: 3.9 g/dL (ref 3.5–5.0)
Alkaline Phosphatase: 76 U/L (ref 38–126)
Anion gap: 9 (ref 5–15)
BUN: 15 mg/dL (ref 6–20)
CO2: 25 mmol/L (ref 22–32)
Calcium: 8.7 mg/dL — ABNORMAL LOW (ref 8.9–10.3)
Chloride: 104 mmol/L (ref 98–111)
Creatinine, Ser: 0.76 mg/dL (ref 0.44–1.00)
GFR calc Af Amer: >60 mL/min (ref >60)
GFR calc non Af Amer: >60 mL/min (ref >60)
Glucose, Bld: 88 mg/dL (ref 70–99)
Potassium: 4.4 mmol/L (ref 3.5–5.1)
Sodium: 138 mmol/L (ref 135–145)
Total Bilirubin: 0.6 mg/dL (ref 0.3–1.2)
Total Protein: 6.6 g/dL (ref 6.5–8.1)

## 2019-07-07 LAB — ETHANOL: Alcohol, Ethyl (B): <10 mg/dL (ref <10)

## 2019-07-07 LAB — LACTIC ACID, PLASMA: Lactic Acid, Venous: 2.2 mmol/L (ref 0.5–1.9)

## 2019-07-07 LAB — RAPID URINE DRUG SCREEN, HOSP PERFORMED
Amphetamines: NOT DETECTED
Barbiturates: NOT DETECTED
Benzodiazepines: POSITIVE — AB
Cocaine: NOT DETECTED
Opiates: POSITIVE — AB
Tetrahydrocannabinol: NOT DETECTED

## 2019-07-07 MED ORDER — SODIUM CHLORIDE 0.9 % IV BOLUS
1000.0000 mL | Freq: Once | INTRAVENOUS | Status: AC
  Administered 2019-07-07: 1000 mL via INTRAVENOUS

## 2019-07-07 NOTE — Discharge Instructions (Addendum)
Follow-up with your neurologist.  Return if you change your mind about admission

## 2019-07-07 NOTE — ED Triage Notes (Signed)
[facility] - EMS was called out for multiple falls today. EMS states that when they walked her she almost fell. VSS.

## 2019-07-07 NOTE — Telephone Encounter (Signed)
Contacted patient to see if she went to ED or urgent care to be evaluated. She has not and is currently resting. She was advised if feeling worse before morning to go to ED or urgent care for further evaluation.   Patient Name: Martha Espinoza Gender: Female DOB: 1965/05/23 Age: 54 Y 63 M 36 D Return Phone Number: [phone] (Primary) Address: City/State/Zip: [place] [address] Client [facility] - Client Client Site [place] - Day Physician Crissie Sickles - MD Contact Type Call Who Is Calling Patient / Member / Family / Caregiver Call Type Triage / Clinical Relationship To Patient Self Return Phone Number [phone] (Primary) Chief Complaint Medication reaction Reason for Call Symptomatic / Request for [place] states she fell 3 times, caller states that she maybe taking a medication that may not be reacting. They are not sure why she is falling. [place] Not Listed [facility] Translation No Nurse Assessment Nurse: Alvis Lemmings, RN, Marcie Bal Date/Time (Eastern Time): 07/07/2019 11:09:50 AM Confirm and document reason for call. If symptomatic, describe symptoms. ---Caller states she fell 3 times, caller states that she maybe taking a medication that may not be reacting. They are not sure why she is falling. 2 new, topiramate(headaches) and lovastatin. [place] about an hour ago last. Going on for 6 mos. D/n hit head. Has the patient had close contact with a person known or suspected to have the novel coronavirus illness OR traveled / lives in area with major community spread (including international travel) in the last 14 days from the onset of symptoms? * If Asymptomatic, screen for exposure and travel within the last 14 days. ---No Does the patient have any new or worsening symptoms? ---Yes Will a triage be completed? ---Yes Related visit to physician within the last 2 weeks? ---Yes Does the PT  have any chronic conditions? (i.e. diabetes, asthma, this includes High risk factors for pregnancy, etc.) ---Yes List chronic conditions. ---Pain management, tailbone pain and unknown reason, neck pain on oxycodone, lyrica, flexeril, headaches, topiramate, high cholesterol, HTN, asthma, smoker recently quit, have been on oxygen and not now, Is the patient pregnant or possibly pregnant? (Ask all females between the ages of 24-55) ---No Is this a behavioral health or substance abuse call? ---NoPLEASE NOTE: All timestamps contained within this report are represented as [place] Standard Time. CONFIDENTIALTY NOTICE: This fax transmission is intended only for the addressee. It contains information that is legally privileged, confidential or otherwise protected from use or disclosure. If you are not the intended recipient, you are strictly prohibited from reviewing, disclosing, copying using or disseminating any of this information or taking any action in reliance on or regarding this information. If you have received this fax in error, please notify us immediately by telephone so that we can arrange for its return to [place]. Phone: [phone], Toll-Free: [phone], Fax: [phone] Page: 2 of 2 Call Id: [id number] Guidelines Guideline Title Affirmed Question Affirmed Notes Nurse Date/Time Eilene Ghazi Time) Weakness (Generalized) and Fatigue Patient sounds very sick or weak to the triager Alvis Lemmings, RN, Marcie Bal 07/07/2019 11:18:47 AM Disp. Time Eilene Ghazi Time) Disposition Final User 07/07/2019 11:20:37 AM Go to ED Now (or PCP triage) Yes Alvis Lemmings, RN, Lenon Oms Disagree/Comply Comply Caller Understands Yes PreDisposition Call Doctor Care Advice Given Per Guideline GO TO ED NOW (OR PCP TRIAGE): * IF NO PCP (PRIMARY CARE PROVIDER) SECOND-LEVEL TRIAGE: You need to be seen within the next hour. Go to the ED/UCC at _____________  [place] as soon as you can. CARE ADVICE given per Weakness and  Fatigue (Adult) guideline. Comments User: Manning Charity, RN Date/Time Eilene Ghazi Time): 07/07/2019 11:20:34 AM trazodone, sertraline, lamictal, buspar too. Pt is on NUMEROUS MEDS THAT COULD IMPAIR HER. User: Manning Charity, RN Date/Time Eilene Ghazi Time): 07/07/2019 11:24:41 AM pt is very sedated. On almost 30 meds and most have sedating s/e. Pt cancelled her a.m. appt BECAUSE SHE FELL. Advised to go to ER if she cant make it physically to the Dr office. Referrals GO TO FACILITY OTHER - SPECIFY

## 2019-07-07 NOTE — ED Notes (Signed)
Date and time results received: 07/07/19 9:51 PM   Test: Lactic Acid Critical Value: 2.2  Name of Provider Notified: Dr. Roderic Palau  Orders Received? Or Actions Taken?: Actions Taken: n/a
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
## 2019-07-07 NOTE — ED Provider Notes (Signed)
[facility] EMERGENCY DEPARTMENT Provider Note   CSN: [id number] Arrival date & time: 07/07/19  2000     History Chief Complaint  Patient presents with  . Altered Mental Status    Martha Espinoza is a 54 y.o. female.  Patient brought into the emergency department for multiple falls.  Patient has a history of bipolar anxiety and  substance abuse.  Patient has been falling for months and she is seen by neurology  The history is provided by the patient, a relative and medical records. No language interpreter was used.  Fall This is a chronic problem. The current episode started 12 to 24 hours ago. The problem occurs constantly. The problem has not changed since onset.Pertinent negatives include no chest pain, no abdominal pain and no headaches. Nothing aggravates the symptoms. Nothing relieves the symptoms. She has tried nothing for the symptoms. The treatment provided no relief.       Past Medical History:  Diagnosis Date  . Abnormal CT of thoracic spine 09/12/2018   Noncontrast.  Increased in number and TINY increase in size of tiny, patchy sclerotic T spine lesions compared to 2018 CT.  Hem/onc summer 2020->w/u->suspected benign bone islands->survellance imaging in 6-12 mo planned.  . Allergic rhinitis   . Arthritis   . Asthma   . Bipolar 1 disorder ([place])   . Chronic pain syndrome    chronic low back pain (Pain mgmt= Dr. [name]).  MRI showed no signif disc dz, only showed some facet arthropathy at L4-5, with joint diastasis at L4-5--plan as of 11/07/16 neurosurg eval is bilat facet injections. (chronic LBP+ fibromyalgia)  . Colitis due to Clostridium difficile 2001  . COPD (chronic obstructive pulmonary disease) ([place])    PFTs 04/2016 showed no sign of copd or asthma, plus she actually had worse FEV1 after taking albuterol.  However, as of 06/2018 she has recent hx of improvement in sx's after taking albuterol.  LONG TIME SMOKER, won't quit.  Intolerant to or cannot afford:  pulmicort, QVAR, advair, symbicort, flovent, bevespi, and spiriva, anoro,stiolto, and trelegy. Pulmicort trial per pulm 12/2018  . Depression   . Fibromyalgia syndrome   . GAD (generalized anxiety disorder)   . GERD (gastroesophageal reflux disease)   . Hepatic hemangioma   . Hepatic steatosis 06/2016   Noted incidentally on CT chest  . History of Salmonella gastroenteritis   . Hoarseness    chronic: laryngoscopy showed edema of vocal folds and R focal fold leukoplakia->>NEEDS TO QUIT SMOKING! Rpt laryngoscopy 03/17/19 showed the leukoplakia spot smaller->to be followed with serial exams by ent. 06/15/19->MUCH imroved vocal fold edema and vocal cord leukoplakia: less hoarseness with smoking cessation!  . Hyperlipidemia    a. Noted 02/2014.  Marland Kitchen Hypertension   . IBS (irritable bowel syndrome)   . Iron deficiency anemia 05/2014   Per pt it is not due to vaginal blood loss; hemoccults sent to pt in mail 413/16.  . Lumbar spondylosis    Mild, mainly focused at L4-5 and L5-S1.  MRI L spine 09/09/17-->mild degenerative disc disease and facet arthrosis without any nerve root encroachment, no spinal stenosis.  . Microscopic hematuria    Intermittent (no w/u done yet, as of 03/2014)  . Migraine headache   . Morbid obesity ([place])   . Multiple lipomas 09/2016   Left upper arm--very small ones.  . Nocturnal hypoxia 09/18/2017   Overnight oximetry -->qualified: ordered 2L oxygen during sleep.  . Obesity hypoventilation syndrome ([place])   . Pain in both lower  legs 2018-2019   Pain and numbness bilat LLs.  NCS/EMGs findings suggestive of S1 radiculopathy bilat: sx's + NCS findings suggestive of neurogenic claudication/spinal stenosis.  ? contribution of venous reflux?-->"idiopathic peripheral neuropathy, progressive->neurol/pain mgmt  . Peripheral edema Fall 2018   R>L, non-pitting for the most part--venous doppler neg for DVT 01/2017.  Saw Dr. Bridgett Larsson (Vasc) 04/12/17 and u/s showed some venous reflux dz but he  felt her sx's in legs were NOT due to her venous insufficiency OR PAD.  Thigh high comp stockings rx'd.  . Polycystic ovarian syndrome   . Pruritic dermatitis 2015/2016   +Scabies prep at [facility] 07/2014. (Primarily pruritic skin, but eventually a subtle rash as well)  . PTSD (post-traumatic stress disorder)   . Raynaud's phenomenon 2019  . Rectal cancer ([place])    a. Followed by Dr. Gala Romney, dx 2000-2001.  Surveillance colonoscopy planned as of 11/2018 GI f/u.  . Right shoulder pain 2017   Murphy/Wainer: RC bursitis + RC tear (MRI)--injection trial is plan per pt report 05/04/16.  . Vitamin D deficiency   . Wheezing    Most of her resp symptoms are c/w upper airway etiology (repeated PFTs ->no obstructive dz). Pulm referred her to ENT 12/2018.    Patient Active Problem List   Diagnosis Date Noted  . Functional gait abnormality 06/29/2019  . Shoulder pain 06/18/2019  . Primary fibromyalgia syndrome 06/18/2019  . Pain in lower limb 06/18/2019  . Idiopathic peripheral neuropathy 06/18/2019  . Long term (current) use of opiate analgesic 06/18/2019  . Depression with anxiety 01/30/2019  . PTSD (post-traumatic stress disorder) 01/30/2019  . Bone lesion 09/25/2018  . Nocturnal hypoxia 10/04/2017  . Leg pain, bilateral 07/15/2017  . Chronic venous insufficiency 04/12/2017  . Acute respiratory failure with hypoxia ([place]) 10/18/2015  . COPD exacerbation ([place]) 10/18/2015  . Drug reaction 07/23/2015  . Fever of unknown origin 07/22/2015  . Lactic acidosis 07/22/2015  . Hypokalemia 07/22/2015  . Nausea and vomiting 07/22/2015  . Chronic back pain 07/22/2015  . Abdominal pain 07/26/2014  . Nausea without vomiting 07/26/2014  . History of colon cancer 07/26/2014  . Iron deficiency anemia 07/07/2014  . Vitamin D deficiency 07/07/2014  . Abnormal nuclear stress test 03/31/2014  . Abnormal cardiac CT angiography 03/31/2014  . Hyperlipidemia 03/24/2014  . Morbid obesity ([place])   .  Chest pain of uncertain etiology [id number]  . COPD (chronic obstructive pulmonary disease) ([place]) 03/23/2014  . Leukocytosis 03/23/2014  . Hyperglycemia 03/23/2014  . Bipolar 1 disorder ([place]) 03/23/2014  . Pruritic rash 03/23/2014  . Chronic anxiety 03/23/2014  . Tobacco abuse 03/23/2014  . Intractable migraine without aura and with status migrainosus 01/14/2014  . Sinusitis 07/02/2013  . Urinary frequency 07/02/2013  . Migraine 07/02/2013  . Anxiety state, unspecified 04/14/2013  . Obesity 04/14/2013  . Anemia 04/14/2013  . CONSTIPATION 10/12/2009  . CARCINOMA IN SITU OF RECTUM 08/25/2009  . GERD 08/25/2009  . IRRITABLE BOWEL SYNDROME 08/25/2009  . DIARRHEA, CHRONIC 08/25/2009  . HEMANGIOMA, HEPATIC 08/24/2009  . POLYCYSTIC OVARIAN DISEASE 08/24/2009  . Personal history of other diseases of digestive system 08/24/2009    Past Surgical History:  Procedure Laterality Date  . ABI's complete Bilateral 09/30/2017   Normal ABIs.  Great toe pressures adequate for wound healing.  Femoral artery waveforms triphasic (normal).  . CARDIOVASCULAR STRESS TEST  03/24/14   Lexscan MIBI: mild anterior ischemia?  Cardiac CT angiogram recommended/done.  . CHOLECYSTECTOMY    . COLONOSCOPY  05/05/2007  adenoma  . coronary CT angio  03/29/14   Two vessel dz/moderate stenosis of mid LAD and proximal RCA; cath recommended.  Marland Kitchen DILATION AND CURETTAGE OF UTERUS     for vaginal bleeding  . EVENT MONITOR  04/2019   No signif abnormality (Dr. Domenic Polite)  . JOINT REPLACEMENT    . laryngoscopy  02/11/2019   (Done b/c of hoarseness and hx of wheezing"Reinke's edema of vocal folds, right vocal cord leukoplakia-->plan for bx of leukoplakia->NEEDS TO QUIT SMOKING!  . LEFT HEART CATHETERIZATION WITH CORONARY ANGIOGRAM N/A 03/31/2014   Normal coronaries, EF 0000000, diastolic dysfunction.  Procedure: LEFT HEART CATHETERIZATION WITH CORONARY ANGIOGRAM;  Surgeon: Sinclair Grooms, MD;  Location: [facility] CATH LAB;   Service: Cardiovascular;  Laterality: N/A;  . LOWER EXT VENOUS DOPPLERS Bilateral 02/08/2017   NEG for DVT (bilat)  . NCS/EMG  10/03/2017   S1 radiculopathy bilat  . Overnight oximetry testing  09/2017   qualified for oxygen supplementation during sleep  . PFTs  04/2016   No sign of COPD or asthma; FEV 1 worse after albut.  . rectal mss  age 37   High-grade rectal adenoma removed from rectum   . TRANSTHORACIC ECHOCARDIOGRAM  03/23/14; 10/21/17; 05/12/19   2015 Normal.  2019: mild LVH, EF 65-70%, study technically inadequate to assess diastolic function, valves were fine, wall motion normal. 2021 EF 60-65%-->all normal.     OB History    Gravida  4   Para  1   Term  1   Preterm      AB  3   Living  1     SAB  3   TAB      Ectopic      Multiple      Live Births              Family History  Problem Relation Age of Onset  . Colon cancer Father 61  . Cancer Father   . Heart disease Mother   . Hypertension Mother   . Hyperlipidemia Mother   . Mental illness Mother   . Diabetes Mother   . Heart attack Mother   . Thyroid disease Mother   . Anxiety disorder Mother   . Depression Mother   . Colon cancer Maternal Grandfather   . Bipolar disorder Son   . Stroke Neg Hx     Social History   Tobacco Use  . Smoking status: Former Smoker    Packs/day: 1.00    Years: 18.00    Pack years: 18.00    Types: Cigarettes  . Smokeless tobacco: Never Used  . Tobacco comment: 25 years  Substance Use Topics  . Alcohol use: No    Alcohol/week: 0.0 standard drinks  . Drug use: No    Home Medications Prior to Admission medications   Medication Sig Start Date End Date Taking? Authorizing Provider  clonazePAM (KLONOPIN) 0.5 MG tablet TAKE 1 TABLET BY MOUTH DURING THE DAY AS NEEDED AND 2 AT BEDTIME 06/01/19   McGowen, Adrian Blackwater, MD  albuterol (PROVENTIL) (2.5 MG/3ML) 0.083% nebulizer solution Take 3 mLs (2.5 mg total) by nebulization every 6 (six) hours as needed for  wheezing or shortness of breath. Patient not taking: Reported on 05/20/2019 08/27/18 08/27/19  Tammi Sou, MD  albuterol (VENTOLIN HFA) 108 (90 Base) MCG/ACT inhaler INHALE 1 TO 2 PUFFS BY MOUTH EVERY 4 HOURS AS NEEDED Patient taking differently: Inhale 1-2 puffs into the lungs 4 (four) times daily.  12/31/18  Mannam, Praveen, MD  AMITIZA 24 MCG capsule Take 24 mcg by mouth 2 (two) times daily. 01/01/18   [provider]  aspirin 81 MG chewable tablet Chew 1 tablet (81 mg total) by mouth daily. Patient taking differently: Chew 81 mg by mouth every evening.  09/06/12   Ripley Fraise, MD  azelastine (ASTELIN) 0.1 % nasal spray Place 2 sprays into both nostrils 2 (two) times daily. 07/26/17   McGowen, Adrian Blackwater, MD  budesonide (PULMICORT) 0.5 MG/2ML nebulizer solution Take 2 mLs by nebulization 2 (two) times daily. 01/15/19   [provider]  busPIRone (BUSPAR) 15 MG tablet Take 1 tablet (15 mg total) by mouth 2 (two) times daily. 05/22/19   [place] Crane, MD  cyclobenzaprine (FLEXERIL) 10 MG tablet TAKE ONE TABLET BY MOUTH EVERY 6 HOURS AS NEEDED (SPASM RELATED TO HEADACHES) Patient taking differently: Take 10 mg by mouth 3 (three) times daily.  07/10/16   McGowen, Adrian Blackwater, MD  diphenhydrAMINE (BENADRYL) 25 MG tablet Take 1 tablet (25 mg total) by mouth every 6 (six) hours. Patient not taking: Reported on 05/20/2019 07/19/15   Fredia Sorrow, MD  EQ ALLERGY RELIEF, CETIRIZINE, 10 MG tablet Take 1 tablet by mouth once daily Patient taking differently: Take 10 mg by mouth at bedtime.  12/29/18   McGowen, Adrian Blackwater, MD  ferrous sulfate 325 (65 FE) MG EC tablet Take 325 mg by mouth every evening.    [provider]  fluticasone (FLONASE) 50 MCG/ACT nasal spray Place 2 sprays into both nostrils daily as needed for allergies.    [provider]  formoterol (PERFOROMIST) 20 MCG/2ML nebulizer solution Take 2 mLs (20 mcg total) by nebulization 2 (two) times daily. 12/31/18    Marshell Garfinkel, MD  lamoTRIgine (LAMICTAL) 100 MG tablet Take 2 tablets at bedtime 07/06/19   [place] Crane, MD  losartan (COZAAR) 50 MG tablet Take 1 tablet by mouth once daily 07/01/19   McGowen, Adrian Blackwater, MD  meloxicam (MOBIC) 15 MG tablet Take 15 mg by mouth every evening.  01/01/18   [provider]  metoprolol tartrate (LOPRESSOR) 50 MG tablet Take 1 tablet by mouth twice daily Patient taking differently: Take 50 mg by mouth 2 (two) times daily.  12/29/18   McGowen, Adrian Blackwater, MD  Naldemedine Tosylate (SYMPROIC) 0.2 MG TABS Take 0.2 mg by mouth at bedtime.     [provider]  nitroGLYCERIN (NITROSTAT) 0.4 MG SL tablet DISSOLVE ONE TABLET UNDER THE TONGUE EVERY 5 MINUTES AS NEEDED FOR CHEST PAIN.  DO NOT EXCEED A TOTAL OF 3 DOSES IN 15 MINUTES Patient not taking: No sig reported 11/13/18   McGowen, Adrian Blackwater, MD  oxyCODONE-acetaminophen (PERCOCET) 10-325 MG tablet Take 1 tablet by mouth 4 (four) times daily.     [provider]  pantoprazole (PROTONIX) 40 MG tablet Take 1 tablet by mouth once daily 07/01/19   McGowen, Adrian Blackwater, MD  Potassium (POTASSIMIN PO) Take 2 tablets by mouth at bedtime.    [provider]  pregabalin (LYRICA) 200 MG capsule Take 200 mg by mouth daily.  01/28/19   [provider]  promethazine (PHENERGAN) 25 MG tablet 1 tab po q6h prn nausea Patient not taking: Reported on 05/20/2019 11/05/18   Tammi Sou, MD  rOPINIRole (REQUIP) 0.25 MG tablet TAKE 3 TABLETS BY MOUTH AT BEDTIME 07/01/19   McGowen, Adrian Blackwater, MD  rosuvastatin (CRESTOR) 20 MG tablet Take 1 tablet (20 mg total) by mouth daily. 05/29/19   McGowen,  Adrian Blackwater, MD  sertraline (ZOLOFT) 50 MG tablet Take 1 tablet (50 mg total) by mouth daily. 05/22/19   [place] Crane, MD  traZODone (DESYREL) 150 MG tablet Take 1 tablet (150 mg total) by mouth at bedtime. 05/22/19   [place] Crane, MD  varenicline (CHANTIX CONTINUING MONTH PAK) 1 MG tablet Take 1 tablet (1 mg total) by mouth 2  (two) times daily. Patient not taking: Reported on 05/20/2019 02/18/19   Tammi Sou, MD  varenicline (CHANTIX STARTING MONTH PAK) 0.5 MG X 11 & 1 MG X 42 tablet Take one 0.5 mg tablet by mouth once daily for 3 days, then increase to one 0.5 mg tablet twice daily for 4 days, then increase to one 1 mg tablet twice daily. Patient not taking: Reported on 05/20/2019 02/18/19   Tammi Sou, MD  citalopram (CELEXA) 40 MG tablet Take 20 mg by mouth at bedtime.   06/18/11  [provider]  omeprazole (PRILOSEC) 20 MG capsule Take 40 mg by mouth daily.  05/20/19  [provider]    Allergies    Contrast media [iodinated diagnostic agents], Shellfish allergy, Codeine, Furosemide, Anoro ellipta [umeclidinium-vilanterol], Flovent diskus [fluticasone propionate (inhal)], Ivermectin, Other, Spiriva handihaler [tiotropium bromide monohydrate], Symbicort [budesonide-formoterol fumarate], Betadine [povidone iodine], Latex, Lipitor [atorvastatin], Permethrin, Stiolto respimat [tiotropium bromide-olodaterol], Tape, and Trelegy ellipta [fluticasone-umeclidin-vilant]  Review of Systems   Review of Systems  Constitutional: Negative for appetite change and fatigue.  HENT: Negative for congestion, ear discharge and sinus pressure.   Eyes: Negative for discharge.  Respiratory: Negative for cough.   Cardiovascular: Negative for chest pain.  Gastrointestinal: Negative for abdominal pain and diarrhea.  Genitourinary: Negative for frequency and hematuria.  Musculoskeletal: Negative for back pain.  Skin: Negative for rash.  Neurological: Positive for weakness. Negative for seizures and headaches.  Psychiatric/Behavioral: Negative for hallucinations.    Physical Exam Updated Vital Signs BP 106/81   Pulse 76   Temp 98.5 F (36.9 C)   Resp 18   Ht 5' (1.524 m)   Wt 110.2 kg   SpO2 100%   BMI 47.46 kg/m   Physical Exam Vitals and nursing note reviewed.  Constitutional:       Appearance: She is well-developed.  HENT:     Head: Normocephalic.     Nose: Nose normal.  Eyes:     General: No scleral icterus.    Conjunctiva/sclera: Conjunctivae normal.  Neck:     Thyroid: No thyromegaly.  Cardiovascular:     Rate and Rhythm: Normal rate and regular rhythm.     Heart sounds: No murmur. No friction rub. No gallop.   Pulmonary:     Breath sounds: No stridor. No wheezing or rales.  Chest:     Chest wall: No tenderness.  Abdominal:     General: There is no distension.     Tenderness: There is no abdominal tenderness. There is no rebound.  Musculoskeletal:        General: Normal range of motion.     Cervical back: Neck supple.     Comments: Very weak in both legs.  She is unable to walk without being unsteady  Lymphadenopathy:     Cervical: No cervical adenopathy.  Skin:    Findings: No erythema or rash.  Neurological:     Mental Status: She is alert and oriented to person, place, and time.     Motor: No abnormal muscle tone.     Coordination: Coordination normal.  Psychiatric:  Behavior: Behavior normal.     ED Results / Procedures / Treatments   Labs (all labs ordered are listed, but only abnormal results are displayed) Labs Reviewed  COMPREHENSIVE METABOLIC PANEL - Abnormal; Notable for the following components:      Result Value   Calcium 8.7 (*)    AST 13 (*)    All other components within normal limits  RAPID URINE DRUG SCREEN, HOSP PERFORMED - Abnormal; Notable for the following components:   Opiates POSITIVE (*)    Benzodiazepines POSITIVE (*)    All other components within normal limits  LACTIC ACID, PLASMA - Abnormal; Notable for the following components:   Lactic Acid, Venous 2.2 (*)    All other components within normal limits  CBC WITH DIFFERENTIAL/PLATELET  ETHANOL  URINALYSIS, ROUTINE W REFLEX MICROSCOPIC    EKG None  Radiology CT Head Wo Contrast  Result Date: 07/07/2019 CLINICAL DATA:  Multiple falls. EXAM: CT HEAD  WITHOUT CONTRAST TECHNIQUE: Contiguous axial images were obtained from the base of the skull through the vertex without intravenous contrast. COMPARISON:  March 01, 2019 FINDINGS: Brain: No evidence of acute infarction, hemorrhage, hydrocephalus, extra-axial collection or mass lesion/mass effect. Vascular: No hyperdense vessel or unexpected calcification. Skull: Normal. Negative for fracture or focal lesion. Sinuses/Orbits: No acute finding. Other: None. IMPRESSION: No acute intracranial pathology. Electronically Signed   By: Virgina Norfolk M.D.   On: 07/07/2019 21:49   DG Chest Port 1 View  Result Date: 07/07/2019 CLINICAL DATA:  Altered level of consciousness, rectal cancer, previous tobacco abuse EXAM: PORTABLE CHEST 1 VIEW COMPARISON:  05/01/2018 FINDINGS: Single frontal view of the chest demonstrates a stable cardiac silhouette. No airspace disease, effusion, or pneumothorax. No acute bony abnormalities. IMPRESSION: 1. No acute intrathoracic process. Electronically Signed   By: Randa Ngo M.D.   On: 07/07/2019 21:30    Procedures Procedures (including critical care time)  Medications Ordered in ED Medications  sodium chloride 0.9 % bolus 1,000 mL (1,000 mLs Intravenous New Bag/Given 07/07/19 2103)    ED Course  I have reviewed the triage vital signs and the nursing notes.  Pertinent labs & imaging results that were available during my care of the patient were reviewed by me and considered in my medical decision making (see chart for details).    MDM Rules/Calculators/A&P                      Patient with multiple falls.  Labs CT unremarkable except for urinalysis does show benzos and Vicodin.  Patient is unable to walk without being unsteady.  She states she has been this way for a long time and she is going to see a new neurologist.  Is offered admission but refuses.  She will leave AMA    This patient presents to the ED for concern of falls, this involves an extensive  number of treatment options, and is a complaint that carries with it a high risk of complications and morbidity.  The differential diagnosis includes tumors in spine.  Tumors in head.  Stroke   Lab Tests:   I Ordered, reviewed, and interpreted labs, which included CBC chemistries drug screen.  Drug screen did show positive for benzos and opiates  Medicines ordered:   I ordered medication IV fluids bolus of normal saline.  Mild improvement  Imaging Studies ordered:   I ordered imaging studies which included CT head chest x-ray and  I independently visualized and interpreted imaging which showed no  acute disease  Additional history obtained:   Additional history obtained from husband  Previous records obtained and reviewed   Consultations Obtained:   Reevaluation:  After the interventions stated above, I reevaluated the patient and found mild improvement of strength.  Critical Interventions: Patient did not want to be admitted so she was signed out AMA .   Final Clinical Impression(s) / ED Diagnoses Final diagnoses:  Fall, initial encounter    Rx / DC Orders ED Discharge Orders    None       Milton Ferguson, MD 07/08/19 1151

## 2019-07-08 ENCOUNTER — Other Ambulatory Visit: Payer: Self-pay

## 2019-07-09 ENCOUNTER — Ambulatory Visit (INDEPENDENT_AMBULATORY_CARE_PROVIDER_SITE_OTHER): Payer: Commercial Managed Care - PPO | Admitting: Licensed Clinical Social Worker

## 2019-07-09 ENCOUNTER — Encounter (HOSPITAL_COMMUNITY): Payer: Self-pay | Admitting: Licensed Clinical Social Worker

## 2019-07-09 ENCOUNTER — Ambulatory Visit (INDEPENDENT_AMBULATORY_CARE_PROVIDER_SITE_OTHER): Payer: Commercial Managed Care - PPO | Admitting: Family Medicine

## 2019-07-09 ENCOUNTER — Other Ambulatory Visit: Payer: Self-pay

## 2019-07-09 ENCOUNTER — Encounter: Payer: Self-pay | Admitting: Family Medicine

## 2019-07-09 VITALS — BP 95/62 | HR 81 | Temp 97.9°F | Resp 16 | Ht 60.0 in

## 2019-07-09 DIAGNOSIS — R296 Repeated falls: Secondary | ICD-10-CM

## 2019-07-09 DIAGNOSIS — F4310 Post-traumatic stress disorder, unspecified: Secondary | ICD-10-CM | POA: Diagnosis not present

## 2019-07-09 DIAGNOSIS — R410 Disorientation, unspecified: Secondary | ICD-10-CM | POA: Diagnosis not present

## 2019-07-09 LAB — POCT URINALYSIS DIPSTICK
Glucose, UA: NEGATIVE
Ketones, UA: NEGATIVE
Leukocytes, UA: NEGATIVE
Nitrite, UA: NEGATIVE
Protein, UA: POSITIVE — AB
Spec Grav, UA: >=1.03 — AB (ref 1.010–1.025)
Urobilinogen, UA: 1 E.U./dL
pH, UA: 6 (ref 5.0–8.0)

## 2019-07-09 MED ORDER — CEFDINIR 300 MG PO CAPS: 300.0000 mg | ORAL_CAPSULE | Freq: Two times a day (BID) | ORAL | 0 refills | Status: DC

## 2019-07-09 NOTE — Patient Instructions (Addendum)
Stop buspirone, cyclobenzaprine, metoprolol, ropinirole, cetirizine, losartan, rosuvastatin, and trazodone.  Increase intake of clear fluids.
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
## 2019-07-10 ENCOUNTER — Other Ambulatory Visit: Payer: Self-pay

## 2019-07-10 LAB — URINE CULTURE
MICRO NUMBER:: 10369171
Result:: NO GROWTH
SPECIMEN QUALITY:: ADEQUATE

## 2019-07-13 ENCOUNTER — Encounter: Payer: Self-pay | Admitting: Family Medicine

## 2019-07-13 ENCOUNTER — Other Ambulatory Visit: Payer: Self-pay

## 2019-07-13 ENCOUNTER — Ambulatory Visit (INDEPENDENT_AMBULATORY_CARE_PROVIDER_SITE_OTHER): Payer: Commercial Managed Care - PPO | Admitting: Family Medicine

## 2019-07-13 VITALS — BP 109/74 | HR 100 | Temp 98.3°F | Resp 16 | Ht 60.0 in | Wt 241.0 lb

## 2019-07-13 DIAGNOSIS — R296 Repeated falls: Secondary | ICD-10-CM

## 2019-07-13 DIAGNOSIS — T887XXA Unspecified adverse effect of drug or medicament, initial encounter: Secondary | ICD-10-CM | POA: Diagnosis not present

## 2019-07-13 DIAGNOSIS — Z86010 Personal history of colonic polyps: Secondary | ICD-10-CM | POA: Diagnosis not present

## 2019-07-13 DIAGNOSIS — F4310 Post-traumatic stress disorder, unspecified: Secondary | ICD-10-CM

## 2019-07-13 DIAGNOSIS — Z860101 Personal history of adenomatous and serrated colon polyps: Secondary | ICD-10-CM

## 2019-07-13 DIAGNOSIS — J309 Allergic rhinitis, unspecified: Secondary | ICD-10-CM | POA: Diagnosis not present

## 2019-07-13 DIAGNOSIS — R4182 Altered mental status, unspecified: Secondary | ICD-10-CM
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
## 2019-07-13 NOTE — Patient Instructions (Signed)
Finish your cefdinir (antibiotic).   Stay off of losartan unless you blood pressure goes to 140/90 or higher. Restart flonase daily and zyrtec daily. Each day you can restart a single medication in the following order: Ropinirole (restless legs med). Buspirone (anxiety med) Trazodone (for sleep) Cyclobenzaprine (muscle relaxer) Rosuvastatin (cholesterol med).

## 2019-07-15 ENCOUNTER — Encounter (HOSPITAL_COMMUNITY): Payer: Self-pay | Admitting: Licensed Clinical Social Worker

## 2019-07-15 ENCOUNTER — Other Ambulatory Visit: Payer: Self-pay

## 2019-07-15 ENCOUNTER — Ambulatory Visit (INDEPENDENT_AMBULATORY_CARE_PROVIDER_SITE_OTHER): Payer: Commercial Managed Care - PPO | Admitting: Licensed Clinical Social Worker

## 2019-07-15 DIAGNOSIS — F418 Other specified anxiety disorders: Secondary | ICD-10-CM | POA: Diagnosis not present

## 2019-07-15 DIAGNOSIS — F4310 Post-traumatic stress disorder, unspecified: Secondary | ICD-10-CM

## 2019-07-15 NOTE — Progress Notes (Signed)
Virtual Visit via Video Note  I connected with Martha Espinoza on 07/15/19 at 12:30 PM EDT by a video enabled telemedicine application and verified that I am speaking with the correct person using two identifiers.   I discussed the limitations of evaluation and management by telemedicine and the availability of in person appointments. The patient expressed understanding and agreed to proceed.  Type of Therapy: Individual Therapy   Treatment Goals addressed: "PTSD, depression and anxiety. I would like to feel more like myself" .  Interventions: Motivational Interviewing and CBT   Summary: Martha Espinoza is a 54 y.o. female who presents with PTSD and Depression with Anxiety.   Suicidal/Homicidal: No without intent/plan   Therapist Response: Almyra Free met with clinician for an individual session. Katura shared about her psychiatric symptoms, her current life events and her homework.  Clinician utilized MI OARS to reflect and summarize thoughts and feelings. Clinician processed health matters with Wendolyn and identified ongoing struggles with dizziness, falling, and confusion. Clinician reviewed notes from primary doctor and processed [name]'s reactions from med changes. Clinician also discussed the importance of creating a "Living Will", as well as advanced directives, in the event that she needs them in the future. Clinician processed PTSD sxs and noted ongoing problems with flashbacks, particularly related to being out of control, feeling like she is being forced to do something, or having someone else make decisions for her. Clinician utilized CBT to normalize these experiences and discussed coping skills.    Plan: Return again in 1-2 weeks.   Diagnosis: Axis I: PTSD and Depression with Anxiety.    I discussed the assessment and treatment plan with the patient. The patient was provided an opportunity to ask questions and all were answered. The patient agreed with the plan and demonstrated an  understanding of the instructions.   The patient was advised to call back or seek an in-person evaluation if the symptoms worsen or if the condition fails to improve as anticipated.  I provided 50 minutes of non-face-to-face time during this encounter.   [name], LCSW

## 2019-07-16 ENCOUNTER — Other Ambulatory Visit: Payer: Self-pay

## 2019-07-16 ENCOUNTER — Telehealth (INDEPENDENT_AMBULATORY_CARE_PROVIDER_SITE_OTHER): Payer: Commercial Managed Care - PPO | Admitting: Psychiatry

## 2019-07-16 ENCOUNTER — Encounter: Payer: Self-pay | Admitting: Psychiatry

## 2019-07-16 DIAGNOSIS — F418 Other specified anxiety disorders: Secondary | ICD-10-CM

## 2019-07-16 DIAGNOSIS — F4310 Post-traumatic stress disorder, unspecified: Secondary | ICD-10-CM

## 2019-07-16 MED ORDER — LAMOTRIGINE 100 MG PO TABS: ORAL_TABLET | ORAL | 0 refills | Status: DC

## 2019-07-16 MED ORDER — SERTRALINE HCL 50 MG PO TABS: 50.0000 mg | ORAL_TABLET | Freq: Every day | ORAL | 0 refills | Status: DC

## 2019-07-16 NOTE — Progress Notes (Addendum)
[facility] MD OP Progress Note   I connected with  Martha Espinoza on 07/16/19 by a video enabled telemedicine application and verified that I am speaking with the correct person using two identifiers.   I discussed the limitations of evaluation and management by telemedicine. The patient expressed understanding and agreed to proceed.    07/16/2019 11:29 AM Martha Espinoza  MRN:  [id number]  Chief Complaint: "I had to go to the doctor."  HPI: Patient reported that she had to be seen by her PCP on April 19 after she developed incoherence and confusion. She stated that her family was concerned and brought her to her PCP. Her PCP reviewed her her medication list and advised to discontinue several different medications.  Her PCP diagnosed her with delirium of unclear etiology.  He recommended her to continue with the plan for MRI C-spine scheduled in May.  He advised her to discontinue the buspirone and trazodone.  Patient stated that she is feeling clear in her thought process now.  She denied any falls.  She stated that she is a little stressed because of her mother's health issues.  She stated that she is slowly starting to begin to feel like herself.  She asked if she needs to adjust any of her psychiatric medications.  Writer advised that she can continue taking the sertraline and the Lamictal at the current doses for now.  Patient was agreeable to that.    Visit Diagnosis:    ICD-10-CM   1. PTSD (post-traumatic stress disorder)  F43.10   2. Depression with anxiety  F41.8     Past Psychiatric History: Depression, anxiety, PTSD  Past Medical History:  Past Medical History:  Diagnosis Date  . Abnormal CT of thoracic spine 09/12/2018   Noncontrast.  Increased in number and TINY increase in size of tiny, patchy sclerotic T spine lesions compared to 2018 CT.  Hem/onc summer 2020->w/u->suspected benign bone islands->survellance imaging in 6-12 mo planned.  . Allergic rhinitis   . Arthritis   .  Asthma   . Bipolar 1 disorder ([place])   . Chronic pain syndrome    chronic low back pain (Pain mgmt= Dr. [name]).  MRI showed no signif disc dz, only showed some facet arthropathy at L4-5, with joint diastasis at L4-5--plan as of 11/07/16 neurosurg eval is bilat facet injections. (chronic LBP+ fibromyalgia)  . Colitis due to Clostridium difficile 2001  . COPD (chronic obstructive pulmonary disease) ([place])    PFTs 04/2016 showed no sign of copd or asthma, plus she actually had worse FEV1 after taking albuterol.  However, as of 06/2018 she has recent hx of improvement in sx's after taking albuterol.  LONG TIME SMOKER, won't quit.  Intolerant to or cannot afford: pulmicort, QVAR, advair, symbicort, flovent, bevespi, and spiriva, anoro,stiolto, and trelegy. Pulmicort trial per pulm 12/2018  . Depression   . Fibromyalgia syndrome   . GAD (generalized anxiety disorder)   . GERD (gastroesophageal reflux disease)   . Hepatic hemangioma   . Hepatic steatosis 06/2016   Noted incidentally on CT chest  . History of Salmonella gastroenteritis   . Hoarseness    chronic: laryngoscopy showed edema of vocal folds and R focal fold leukoplakia->>NEEDS TO QUIT SMOKING! Rpt laryngoscopy 03/17/19 showed the leukoplakia spot smaller->to be followed with serial exams by ent. 06/15/19->MUCH imroved vocal fold edema and vocal cord leukoplakia: less hoarseness with smoking cessation!  . Hyperlipidemia    a. Noted 02/2014.  Marland Kitchen Hypertension   . IBS (irritable  bowel syndrome)   . Iron deficiency anemia 05/2014   Per pt it is not due to vaginal blood loss; hemoccults sent to pt in mail 413/16.  . Lumbar spondylosis    Mild, mainly focused at L4-5 and L5-S1.  MRI L spine 09/09/17-->mild degenerative disc disease and facet arthrosis without any nerve root encroachment, no spinal stenosis.  . Microscopic hematuria    Intermittent (no w/u done yet, as of 03/2014)  . Migraine headache   . Morbid obesity ([place])   . Multiple lipomas  09/2016   Left upper arm--very small ones.  . Nocturnal hypoxia 09/18/2017   Overnight oximetry -->qualified: ordered 2L oxygen during sleep.  . Obesity hypoventilation syndrome ([place])   . Pain in both lower legs 2018-2019   Pain and numbness bilat LLs.  NCS/EMGs findings suggestive of S1 radiculopathy bilat: sx's + NCS findings suggestive of neurogenic claudication/spinal stenosis.  ? contribution of venous reflux?-->"idiopathic peripheral neuropathy, progressive->neurol/pain mgmt  . Peripheral edema Fall 2018   R>L, non-pitting for the most part--venous doppler neg for DVT 01/2017.  Saw Dr. Bridgett Larsson (Vasc) 04/12/17 and u/s showed some venous reflux dz but he felt her sx's in legs were NOT due to her venous insufficiency OR PAD.  Thigh high comp stockings rx'd.  . Polycystic ovarian syndrome   . Pruritic dermatitis 2015/2016   +Scabies prep at [facility] 07/2014. (Primarily pruritic skin, but eventually a subtle rash as well)  . PTSD (post-traumatic stress disorder)   . Raynaud's phenomenon 2019  . Rectal cancer ([place])    a. Followed by Dr. Gala Romney, dx 2000-2001.  Surveillance colonoscopy planned as of 11/2018 GI f/u.  . Right shoulder pain 2017   Murphy/Wainer: RC bursitis + RC tear (MRI)--injection trial is plan per pt report 05/04/16.  . Vitamin D deficiency   . Wheezing    Most of her resp symptoms are c/w upper airway etiology (repeated PFTs ->no obstructive dz). Pulm referred her to ENT 12/2018.    Past Surgical History:  Procedure Laterality Date  . ABI's complete Bilateral 09/30/2017   Normal ABIs.  Great toe pressures adequate for wound healing.  Femoral artery waveforms triphasic (normal).  . CARDIOVASCULAR STRESS TEST  03/24/14   Lexscan MIBI: mild anterior ischemia?  Cardiac CT angiogram recommended/done.  . CHOLECYSTECTOMY    . COLONOSCOPY  05/05/2007   adenoma  . coronary CT angio  03/29/14   Two vessel dz/moderate stenosis of mid LAD and proximal RCA; cath recommended.  Marland Kitchen  DILATION AND CURETTAGE OF UTERUS     for vaginal bleeding  . EVENT MONITOR  04/2019   No signif abnormality (Dr. Domenic Polite)  . JOINT REPLACEMENT    . laryngoscopy  02/11/2019   (Done b/c of hoarseness and hx of wheezing"Reinke's edema of vocal folds, right vocal cord leukoplakia-->plan for bx of leukoplakia->NEEDS TO QUIT SMOKING!  . LEFT HEART CATHETERIZATION WITH CORONARY ANGIOGRAM N/A 03/31/2014   Normal coronaries, EF 0000000, diastolic dysfunction.  Procedure: LEFT HEART CATHETERIZATION WITH CORONARY ANGIOGRAM;  Surgeon: Sinclair Grooms, MD;  Location: [facility] C[facility] LAB;  Service: Cardiovascular;  Laterality: N/A;  . LOWER EXT VENOUS DOPPLERS Bilateral 02/08/2017   NEG for DVT (bilat)  . NCS/EMG  10/03/2017   S1 radiculopathy bilat  . Overnight oximetry testing  09/2017   qualified for oxygen supplementation during sleep  . PFTs  04/2016   No sign of COPD or asthma; FEV 1 worse after albut.  . rectal mss  age 29  High-grade rectal adenoma removed from rectum   . TRANSTHORACIC ECHOCARDIOGRAM  03/23/14; 10/21/17; 05/12/19   2015 Normal.  2019: mild LVH, EF 65-70%, study technically inadequate to assess diastolic function, valves were fine, wall motion normal. 2021 EF 60-65%-->all normal.    Family Psychiatric History: See below  Family History:  Family History  Problem Relation Age of Onset  . Colon cancer Father 44  . Cancer Father   . Heart disease Mother   . Hypertension Mother   . Hyperlipidemia Mother   . Mental illness Mother   . Diabetes Mother   . Heart attack Mother   . Thyroid disease Mother   . Anxiety disorder Mother   . Depression Mother   . Colon cancer Maternal Grandfather   . Bipolar disorder Son   . Stroke Neg Hx     Social History:  Social History   Socioeconomic History  . Marital status: Married    Spouse name: tracey  . Number of children: 1  . Years of education: Not on file  . Highest education level: 9th grade  Occupational History  .  Occupation: own Games developer: [employer]  Tobacco Use  . Smoking status: Former Smoker    Packs/day: 1.00    Years: 18.00    Pack years: 18.00    Types: Cigarettes  . Smokeless tobacco: Never Used  . Tobacco comment: 25 years  Substance and Sexual Activity  . Alcohol use: No    Alcohol/week: 0.0 standard drinks  . Drug use: No  . Sexual activity: Not Currently    Birth control/protection: None, Surgical  Other Topics Concern  . Not on file  Social History Narrative   A friend   Social Determinants of Health   Financial Resource Strain: Low Risk   . Difficulty of Paying Living Expenses: Not hard at all  Food Insecurity: No Food Insecurity  . Worried About Charity fundraiser in the Last Year: Never true  . Ran Out of Food in the Last Year: Never true  Transportation Needs: No Transportation Needs  . Lack of Transportation (Medical): No  . Lack of Transportation (Non-Medical): No  Physical Activity: Inactive  . Days of Exercise per Week: 0 days  . Minutes of Exercise per Session: 0 min  Stress: Stress Concern Present  . Feeling of Stress : Very much  Social Connections: Moderately Isolated  . Frequency of Communication with Friends and Family: Once a week  . Frequency of Social Gatherings with Friends and Family: Once a week  . Attends Religious Services: Never  . Active Member of Clubs or Organizations: No  . Attends Archivist Meetings: Never  . Marital Status: Married    Allergies:  Allergies  Allergen Reactions  . Contrast Media [Iodinated Diagnostic Agents] Anaphylaxis    Needs 13-hour prep  . Shellfish Allergy Anaphylaxis  . Codeine Nausea And Vomiting and Other (See Comments)    Hallucinations, Also sees people that are not there   . Furosemide Rash    Rash, soB.  Marland Kitchen Anoro Ellipta [Umeclidinium-Vilanterol] Other (See Comments)    Pt states it caused blisters.  . Flovent Diskus [Fluticasone Propionate (Inhal)]      headache  . Ivermectin Nausea And Vomiting    N/V and rash  . Other   . Spiriva Handihaler [Tiotropium Bromide Monohydrate] Other (See Comments)    Severe headaches  . Symbicort [Budesonide-Formoterol Fumarate]     headache  . Betadine [Povidone  Iodine] Rash  . Latex Hives  . Lipitor [Atorvastatin] Itching  . Permethrin Itching  . Stiolto Respimat [Tiotropium Bromide-Olodaterol] Other (See Comments)    Mouth ulcers  . Tape Dermatitis    Paper tape and clear  . Trelegy Ellipta [Fluticasone-Umeclidin-Vilant] Other (See Comments)    headaches    Metabolic Disorder Labs: Lab Results  Component Value Date   HGBA1C 5.8 09/24/2018   MPG 123 (H) 03/23/2014   No results found for: PROLACTIN Lab Results  Component Value Date   CHOL 186 05/27/2019   TRIG 264.0 (H) 05/27/2019   HDL 33.80 (L) 05/27/2019   CHOLHDL 6 05/27/2019   VLDL 52.8 (H) 05/27/2019   LDLCALC 144 (H) 02/06/2019   LDLCALC 169 (H) 03/24/2014   Lab Results  Component Value Date   TSH 1.12 10/08/2016   TSH 0.39 10/24/2015    Therapeutic Level Labs: No results found for: LITHIUM No results found for: VALPROATE No components found for:  CBMZ  Current Medications: Current Outpatient Medications  Medication Sig Dispense Refill  . albuterol (PROVENTIL) (2.5 MG/3ML) 0.083% nebulizer solution Take 3 mLs (2.5 mg total) by nebulization every 6 (six) hours as needed for wheezing or shortness of breath. (Patient not taking: Reported on 05/20/2019) 75 mL 6  . albuterol (VENTOLIN HFA) 108 (90 Base) MCG/ACT inhaler INHALE 1 TO 2 PUFFS BY MOUTH EVERY 4 HOURS AS NEEDED (Patient taking differently: Inhale 1-2 puffs into the lungs 4 (four) times daily. ) 16 g 11  . AMITIZA 24 MCG capsule Take 24 mcg by mouth 2 (two) times daily.  1  . aspirin 81 MG chewable tablet Chew 1 tablet (81 mg total) by mouth daily. (Patient taking differently: Chew 81 mg by mouth every evening. ) 14 tablet 0  . azelastine (ASTELIN) 0.1 % nasal spray  Place 2 sprays into both nostrils 2 (two) times daily. 30 mL 6  . busPIRone (BUSPAR) 15 MG tablet Take 1 tablet (15 mg total) by mouth 2 (two) times daily. (Patient not taking: Reported on 07/13/2019) 180 tablet 0  . cefdinir (OMNICEF) 300 MG capsule Take 1 capsule (300 mg total) by mouth 2 (two) times daily. 10 capsule 0  . clonazePAM (KLONOPIN) 0.5 MG tablet TAKE 1 TABLET BY MOUTH DURING THE DAY AS NEEDED AND 2 AT BEDTIME 90 tablet 5  . cyclobenzaprine (FLEXERIL) 10 MG tablet TAKE ONE TABLET BY MOUTH EVERY 6 HOURS AS NEEDED (SPASM RELATED TO HEADACHES) (Patient not taking: Reported on 07/13/2019) 42 tablet 5  . EQ ALLERGY RELIEF, CETIRIZINE, 10 MG tablet Take 1 tablet by mouth once daily 90 tablet 1  . ferrous sulfate 325 (65 FE) MG EC tablet Take 325 mg by mouth every evening.    . fluconazole (DIFLUCAN) 150 MG tablet fluconazole 150 mg tablet  TAKE ONE TABLET BY MOUTH AS A ONE TIME DOSE    . fluticasone (FLONASE) 50 MCG/ACT nasal spray Place 2 sprays into both nostrils daily as needed for allergies.    Marland Kitchen lamoTRIgine (LAMICTAL) 100 MG tablet Take 2 tablets at bedtime 180 tablet 0  . losartan (COZAAR) 50 MG tablet Take 1 tablet by mouth once daily 90 tablet 0  . meloxicam (MOBIC) 15 MG tablet Take 15 mg by mouth every evening.   1  . metoprolol tartrate (LOPRESSOR) 50 MG tablet Take 1 tablet by mouth twice daily (Patient not taking: No sig reported) 180 tablet 1  . Naldemedine Tosylate (SYMPROIC) 0.2 MG TABS Take 0.2 mg by mouth at bedtime.     Marland Kitchen  nitroGLYCERIN (NITROSTAT) 0.4 MG SL tablet DISSOLVE ONE TABLET UNDER THE TONGUE EVERY 5 MINUTES AS NEEDED FOR CHEST PAIN.  DO NOT EXCEED A TOTAL OF 3 DOSES IN 15 MINUTES (Patient not taking: Reported on 07/09/2019) 10 tablet 0  . oxyCODONE-acetaminophen (PERCOCET) 10-325 MG tablet Take 1 tablet by mouth 4 (four) times daily.     . pantoprazole (PROTONIX) 40 MG tablet Take 1 tablet by mouth once daily 90 tablet 0  . Potassium (POTASSIMIN PO) Take 2 tablets  by mouth at bedtime.    . pregabalin (LYRICA) 200 MG capsule Take 200 mg by mouth daily.     . promethazine (PHENERGAN) 25 MG tablet 1 tab po q6h prn nausea 30 tablet 3  . rOPINIRole (REQUIP) 0.25 MG tablet TAKE 3 TABLETS BY MOUTH AT BEDTIME (Patient not taking: Reported on 07/13/2019) 270 tablet 0  . rosuvastatin (CRESTOR) 20 MG tablet Take 1 tablet (20 mg total) by mouth daily. (Patient not taking: Reported on 07/13/2019) 30 tablet 3  . sertraline (ZOLOFT) 50 MG tablet Take 1 tablet (50 mg total) by mouth daily. 90 tablet 0  . topiramate (TOPAMAX) 50 MG tablet Take 50 mg by mouth at bedtime.    . traZODone (DESYREL) 150 MG tablet Take 1 tablet (150 mg total) by mouth at bedtime. (Patient not taking: Reported on 07/13/2019) 90 tablet 0   No current facility-administered medications for this visit.     Musculoskeletal: Strength & Muscle Tone: unable to assess due to telemed visit Gait & Station: unable to assess due to telemed visit Patient leans: unable to assess due to telemed visit  Psychiatric Specialty Exam: Review of Systems  There were no vitals taken for this visit.There is no height or weight on file to calculate BMI.  General Appearance: fairly groomed  Eye Contact:  good  Speech:  Clear and Coherent and Normal Rate  Volume:  Normal  Mood:  Euthymic  Affect:  Appropriate  Thought Process:  Goal Directed, Linear and Descriptions of Associations: Intact  Orientation:  Full (Time, Place, and Person)  Thought Content: Logical   Suicidal Thoughts:  No  Homicidal Thoughts:  No  Memory:  Recent;   Good Remote;   Good  Judgement:  Fair  Insight:  Fair  Psychomotor Activity:  Normal  Concentration:  Concentration: Good and Attention Span: Good  Recall:  Good  Fund of Knowledge: Fair  Language: Good  Akathisia:  Negative  Handed:  Right  AIMS (if indicated): not done   Assets:  Communication Skills Desire for Improvement Financial Resources/Insurance Housing Social  Support  ADL's:  Intact  Cognition: WNL  Sleep:  Fair   Screenings: PHQ2-9     Office Visit from 08/16/2017 in [place] Office Visit from 07/17/2017 in [place]  PHQ-2 Total Score  2  6  PHQ-9 Total Score  18  18       Assessment and Plan: Pt is dealing with a few stressors and has had medication changes made recently. Will recommend that she follows the recommendations made by her PCP.  1. PTSD (post-traumatic stress disorder)  - sertraline (ZOLOFT) 50 MG tablet; Take 1 tablet (50 mg total) by mouth daily.  Dispense: 90 tablet; Refill: 0  2. Depression with anxiety  - sertraline (ZOLOFT) 50 MG tablet; Take 1 tablet (50 mg total) by mouth daily.  Dispense: 90 tablet; Refill: 0 - lamoTRIgine (LAMICTAL) 100 MG tablet; Take 2 tablets (200 mg  total) by mouth at bedtime.  Dispense: 90 tablet; Refill: 0  Buspar and Trazodone discontinued by PCP for concern for contribution to delirium which is now resolved. Continue individual therapy. Follow-up in 6 weeks.  [place] Crane, MD 07/16/2019, 11:29 AM

## 2019-07-23 ENCOUNTER — Other Ambulatory Visit: Payer: Self-pay

## 2019-07-23 ENCOUNTER — Encounter (HOSPITAL_COMMUNITY): Payer: Self-pay | Admitting: Licensed Clinical Social Worker

## 2019-07-23 ENCOUNTER — Ambulatory Visit (INDEPENDENT_AMBULATORY_CARE_PROVIDER_SITE_OTHER): Payer: Commercial Managed Care - PPO | Admitting: Licensed Clinical Social Worker

## 2019-07-23 DIAGNOSIS — F418 Other specified anxiety disorders: Secondary | ICD-10-CM | POA: Diagnosis not present

## 2019-07-23 DIAGNOSIS — F4310 Post-traumatic stress disorder, unspecified: Secondary | ICD-10-CM | POA: Diagnosis not present

## 2019-07-23 NOTE — Progress Notes (Addendum)
Virtual Visit via Video Note  I connected with Martha Espinoza on 07/23/19 at  2:30 PM EDT by a video enabled telemedicine application and verified that I am speaking with the correct person using two identifiers.  Location: Client: home Clinician: office   I discussed the limitations of evaluation and management by telemedicine and the availability of in person appointments. The patient expressed understanding and agreed to proceed.  Type of Therapy: Individual Therapy   Treatment Goals addressed: "PTSD, depression and anxiety. I would like to feel more like myself" .  Interventions: Motivational Interviewing and Supportive Counseling    Summary: Martha Espinoza is a 54 y.o. female who presents with PTSD and Depression with Anxiety.   Suicidal/Homicidal: No without intent/plan   Therapist Response: Martha Espinoza met with clinician for an individual session. Martha Espinoza shared about her psychiatric symptoms, her current life events and her homework.  Clinician explored interactions at home with family members and other events of the week. Clinician utilized MI OARS to reflect and summarize all of the stress and drama that has occurred. Clinician processed frustration with calls and noted that she fell again yesterday and really hurt her back, head, and bottom. Clinician reflected thoughts and feelings about upcoming loss of grandmother, who is 53 and was "sent home from the hospital to die". Clinician provided supportive counseling to process this future loss, as it is imminent.    Plan: Return again in 1-2 weeks.   Diagnosis: Axis I: PTSD and Depression with Anxiety.       I discussed the assessment and treatment plan with the patient. The patient was provided an opportunity to ask questions and all were answered. The patient agreed with the plan and demonstrated an understanding of the instructions.   The patient was advised to call back or seek an in-person evaluation if the symptoms worsen or if  the condition fails to improve as anticipated.  I provided 45 minutes of non-face-to-face time during this encounter.   Martha Curling, LCSW

## 2019-07-27 ENCOUNTER — Telehealth: Payer: Self-pay

## 2019-07-27 DIAGNOSIS — F418 Other specified anxiety disorders: Secondary | ICD-10-CM

## 2019-07-27 MED ORDER — TRAZODONE HCL 150 MG PO TABS: 150.0000 mg | ORAL_TABLET | Freq: Every day | ORAL | 0 refills | Status: DC

## 2019-07-27 NOTE — Telephone Encounter (Signed)
Pt needs refills on the trazodone.

## 2019-07-27 NOTE — Telephone Encounter (Signed)
This is Dr Starleen Arms pt. I have copied her here. It seems pt's PCP has discontinued her Trazodone per Dr. Starleen Arms last note.

## 2019-07-27 NOTE — Telephone Encounter (Signed)
PCP had discontinued it but she can restart it as needed. Prescription sent.

## 2019-07-29 ENCOUNTER — Ambulatory Visit: Payer: Commercial Managed Care - PPO | Admitting: Family Medicine

## 2019-07-29 ENCOUNTER — Ambulatory Visit: Payer: Commercial Managed Care - PPO

## 2019-07-29 NOTE — Progress Notes (Deleted)
OFFICE VISIT  07/29/2019   CC: No chief complaint on file.    HPI:    Patient is a 54 y.o. Caucasian female who presents for 2 wk f/u recent probs with altered mental status and recurrent falls. A/P as of last visit: ") Altered mental status + recurrent falls; unclear etiology. Seems to have cleared since tx with cefdinir and stopping several of her meds. Definite psychosomatic component to things at this time. No new/additional w/u needed at this time. Keep with plan for MRI brain and C spine set for 08/10/19 (via her neurology provider). Instructions: [name] your cefdinir (antibiotic).   Stay off of losartan unless you blood pressure goes to 140/90 or higher. Restart flonase daily and zyrtec daily. Each day you can restart a single medication in the following order: Ropinirole (restless legs med). Buspirone (anxiety med) Trazodone (for sleep) Cyclobenzaprine (muscle relaxer) Rosuvastatin (cholesterol med).  2) Allergic rhinitis, mild RAD. No sign of need for prednisone. Restart zyrtec and flonase.  3) Hx of high grade rectal adenoma. Due for colonoscopy with Dr. [name] and she is working on getting this arranged.  4) Chronic pain syndrome: mgmt as per [facility] neurology in [place].  5) Bipolar d/o, chronic anxiety, PTSD: ongoing psychiatry and counseling mgmt."  INTERIM HX: ***  Past Medical History:  Diagnosis Date  . Abnormal CT of thoracic spine 09/12/2018   Noncontrast.  Increased in number and TINY increase in size of tiny, patchy sclerotic T spine lesions compared to 2018 CT.  Hem/onc summer 2020->w/u->suspected benign bone islands->survellance imaging in 6-12 mo planned.  . Allergic rhinitis   . Arthritis   . Asthma   . Bipolar 1 disorder ([place])   . Chronic pain syndrome    chronic low back pain (Pain mgmt= Dr. [name]).  MRI showed no signif disc dz, only showed some facet arthropathy at L4-5, with joint diastasis at L4-5--plan as of 11/07/16 neurosurg  eval is bilat facet injections. (chronic LBP+ fibromyalgia)  . Colitis due to Clostridium difficile 2001  . COPD (chronic obstructive pulmonary disease) ([place])    PFTs 04/2016 showed no sign of copd or asthma, plus she actually had worse FEV1 after taking albuterol.  However, as of 06/2018 she has recent hx of improvement in sx's after taking albuterol.  LONG TIME SMOKER, won't quit.  Intolerant to or cannot afford: pulmicort, QVAR, advair, symbicort, flovent, bevespi, and spiriva, anoro,stiolto, and trelegy. Pulmicort trial per pulm 12/2018  . Depression   . Fibromyalgia syndrome   . GAD (generalized anxiety disorder)   . GERD (gastroesophageal reflux disease)   . Hepatic hemangioma   . Hepatic steatosis 06/2016   Noted incidentally on CT chest  . History of Salmonella gastroenteritis   . Hoarseness    chronic: laryngoscopy showed edema of vocal folds and R focal fold leukoplakia->>NEEDS TO QUIT SMOKING! Rpt laryngoscopy 03/17/19 showed the leukoplakia spot smaller->to be followed with serial exams by ent. 06/15/19->MUCH imroved vocal fold edema and vocal cord leukoplakia: less hoarseness with smoking cessation!  . Hyperlipidemia    a. Noted 02/2014.  Marland Kitchen Hypertension   . IBS (irritable bowel syndrome)   . Iron deficiency anemia 05/2014   Per pt it is not due to vaginal blood loss; hemoccults sent to pt in mail 413/16.  . Lumbar spondylosis    Mild, mainly focused at L4-5 and L5-S1.  MRI L spine 09/09/17-->mild degenerative disc disease and facet arthrosis without any nerve root encroachment, no spinal stenosis.  . Microscopic hematuria  Intermittent (no w/u done yet, as of 03/2014)  . Migraine headache   . Morbid obesity ([place])   . Multiple lipomas 09/2016   Left upper arm--very small ones.  . Nocturnal hypoxia 09/18/2017   Overnight oximetry -->qualified: ordered 2L oxygen during sleep.  . Obesity hypoventilation syndrome ([place])   . Pain in both lower legs 2018-2019   Pain and numbness  bilat LLs.  NCS/EMGs findings suggestive of S1 radiculopathy bilat: sx's + NCS findings suggestive of neurogenic claudication/spinal stenosis.  ? contribution of venous reflux?-->"idiopathic peripheral neuropathy, progressive->neurol/pain mgmt  . Peripheral edema Fall 2018   R>L, non-pitting for the most part--venous doppler neg for DVT 01/2017.  Saw Dr. Bridgett Larsson (Vasc) 04/12/17 and u/s showed some venous reflux dz but he felt her sx's in legs were NOT due to her venous insufficiency OR PAD.  Thigh high comp stockings rx'd.  . Polycystic ovarian syndrome   . Pruritic dermatitis 2015/2016   +Scabies prep at [facility] 07/2014. (Primarily pruritic skin, but eventually a subtle rash as well)  . PTSD (post-traumatic stress disorder)   . Raynaud's phenomenon 2019  . Rectal cancer ([place])    a. Followed by Dr. Gala Romney, dx 2000-2001.  Surveillance colonoscopy planned as of 11/2018 GI f/u.  . Right shoulder pain 2017   Murphy/Wainer: RC bursitis + RC tear (MRI)--injection trial is plan per pt report 05/04/16.  . Vitamin D deficiency   . Wheezing    Most of her resp symptoms are c/w upper airway etiology (repeated PFTs ->no obstructive dz). Pulm referred her to ENT 12/2018.    Past Surgical History:  Procedure Laterality Date  . ABI's complete Bilateral 09/30/2017   Normal ABIs.  Great toe pressures adequate for wound healing.  Femoral artery waveforms triphasic (normal).  . CARDIOVASCULAR STRESS TEST  03/24/14   Lexscan MIBI: mild anterior ischemia?  Cardiac CT angiogram recommended/done.  . CHOLECYSTECTOMY    . COLONOSCOPY  05/05/2007   adenoma  . coronary CT angio  03/29/14   Two vessel dz/moderate stenosis of mid LAD and proximal RCA; cath recommended.  Marland Kitchen DILATION AND CURETTAGE OF UTERUS     for vaginal bleeding  . EVENT MONITOR  04/2019   No signif abnormality (Dr. Domenic Polite)  . JOINT REPLACEMENT    . laryngoscopy  02/11/2019   (Done b/c of hoarseness and hx of wheezing"Reinke's edema of vocal  folds, right vocal cord leukoplakia-->plan for bx of leukoplakia->NEEDS TO QUIT SMOKING!  . LEFT HEART CATHETERIZATION WITH CORONARY ANGIOGRAM N/A 03/31/2014   Normal coronaries, EF 0000000, diastolic dysfunction.  Procedure: LEFT HEART CATHETERIZATION WITH CORONARY ANGIOGRAM;  Surgeon: Sinclair Grooms, MD;  Location: [facility] CATH LAB;  Service: Cardiovascular;  Laterality: N/A;  . LOWER EXT VENOUS DOPPLERS Bilateral 02/08/2017   NEG for DVT (bilat)  . NCS/EMG  10/03/2017   S1 radiculopathy bilat  . Overnight oximetry testing  09/2017   qualified for oxygen supplementation during sleep  . PFTs  04/2016   No sign of COPD or asthma; FEV 1 worse after albut.  . rectal mss  age 73   High-grade rectal adenoma removed from rectum   . TRANSTHORACIC ECHOCARDIOGRAM  03/23/14; 10/21/17; 05/12/19   2015 Normal.  2019: mild LVH, EF 65-70%, study technically inadequate to assess diastolic function, valves were fine, wall motion normal. 2021 EF 60-65%-->all normal.    Outpatient Medications Prior to Visit  Medication Sig Dispense Refill  . albuterol (PROVENTIL) (2.5 MG/3ML) 0.083% nebulizer solution Take 3 mLs (2.5  mg total) by nebulization every 6 (six) hours as needed for wheezing or shortness of breath. (Patient not taking: Reported on 05/20/2019) 75 mL 6  . albuterol (VENTOLIN HFA) 108 (90 Base) MCG/ACT inhaler INHALE 1 TO 2 PUFFS BY MOUTH EVERY 4 HOURS AS NEEDED (Patient taking differently: Inhale 1-2 puffs into the lungs 4 (four) times daily. ) 16 g 11  . AMITIZA 24 MCG capsule Take 24 mcg by mouth 2 (two) times daily.  1  . aspirin 81 MG chewable tablet Chew 1 tablet (81 mg total) by mouth daily. (Patient taking differently: Chew 81 mg by mouth every evening. ) 14 tablet 0  . azelastine (ASTELIN) 0.1 % nasal spray Place 2 sprays into both nostrils 2 (two) times daily. 30 mL 6  . busPIRone (BUSPAR) 15 MG tablet Take 1 tablet (15 mg total) by mouth 2 (two) times daily. (Patient not taking: Reported on  07/13/2019) 180 tablet 0  . cefdinir (OMNICEF) 300 MG capsule Take 1 capsule (300 mg total) by mouth 2 (two) times daily. 10 capsule 0  . clonazePAM (KLONOPIN) 0.5 MG tablet TAKE 1 TABLET BY MOUTH DURING THE DAY AS NEEDED AND 2 AT BEDTIME 90 tablet 5  . cyclobenzaprine (FLEXERIL) 10 MG tablet TAKE ONE TABLET BY MOUTH EVERY 6 HOURS AS NEEDED (SPASM RELATED TO HEADACHES) (Patient not taking: Reported on 07/13/2019) 42 tablet 5  . EQ ALLERGY RELIEF, CETIRIZINE, 10 MG tablet Take 1 tablet by mouth once daily 90 tablet 1  . ferrous sulfate 325 (65 FE) MG EC tablet Take 325 mg by mouth every evening.    . fluconazole (DIFLUCAN) 150 MG tablet fluconazole 150 mg tablet  TAKE ONE TABLET BY MOUTH AS A ONE TIME DOSE    . fluticasone (FLONASE) 50 MCG/ACT nasal spray Place 2 sprays into both nostrils daily as needed for allergies.    Marland Kitchen lamoTRIgine (LAMICTAL) 100 MG tablet Take 2 tablets at bedtime 180 tablet 0  . losartan (COZAAR) 50 MG tablet Take 1 tablet by mouth once daily 90 tablet 0  . meloxicam (MOBIC) 15 MG tablet Take 15 mg by mouth every evening.   1  . metoprolol tartrate (LOPRESSOR) 50 MG tablet Take 1 tablet by mouth twice daily (Patient not taking: No sig reported) 180 tablet 1  . Naldemedine Tosylate (SYMPROIC) 0.2 MG TABS Take 0.2 mg by mouth at bedtime.     . nitroGLYCERIN (NITROSTAT) 0.4 MG SL tablet DISSOLVE ONE TABLET UNDER THE TONGUE EVERY 5 MINUTES AS NEEDED FOR CHEST PAIN.  DO NOT EXCEED A TOTAL OF 3 DOSES IN 15 MINUTES (Patient not taking: Reported on 07/09/2019) 10 tablet 0  . oxyCODONE-acetaminophen (PERCOCET) 10-325 MG tablet Take 1 tablet by mouth 4 (four) times daily.     . pantoprazole (PROTONIX) 40 MG tablet Take 1 tablet by mouth once daily 90 tablet 0  . Potassium (POTASSIMIN PO) Take 2 tablets by mouth at bedtime.    . pregabalin (LYRICA) 200 MG capsule Take 200 mg by mouth daily.     . promethazine (PHENERGAN) 25 MG tablet 1 tab po q6h prn nausea 30 tablet 3  . rOPINIRole  (REQUIP) 0.25 MG tablet TAKE 3 TABLETS BY MOUTH AT BEDTIME (Patient not taking: Reported on 07/13/2019) 270 tablet 0  . rosuvastatin (CRESTOR) 20 MG tablet Take 1 tablet (20 mg total) by mouth daily. (Patient not taking: Reported on 07/13/2019) 30 tablet 3  . sertraline (ZOLOFT) 50 MG tablet Take 1 tablet (50 mg total) by mouth  daily. 90 tablet 0  . topiramate (TOPAMAX) 50 MG tablet Take 50 mg by mouth at bedtime.    . traZODone (DESYREL) 150 MG tablet Take 1 tablet (150 mg total) by mouth at bedtime. 30 tablet 0   No facility-administered medications prior to visit.    Allergies  Allergen Reactions  . Contrast Media [Iodinated Diagnostic Agents] Anaphylaxis    Needs 13-hour prep  . Shellfish Allergy Anaphylaxis  . Codeine Nausea And Vomiting and Other (See Comments)    Hallucinations, Also sees people that are not there   . Furosemide Rash    Rash, soB.  Marland Kitchen Anoro Ellipta [Umeclidinium-Vilanterol] Other (See Comments)    Pt states it caused blisters.  . Flovent Diskus [Fluticasone Propionate (Inhal)]     headache  . Ivermectin Nausea And Vomiting    N/V and rash  . Other   . Spiriva Handihaler [Tiotropium Bromide Monohydrate] Other (See Comments)    Severe headaches  . Symbicort [Budesonide-Formoterol Fumarate]     headache  . Betadine [Povidone Iodine] Rash  . Latex Hives  . Lipitor [Atorvastatin] Itching  . Permethrin Itching  . Stiolto Respimat [Tiotropium Bromide-Olodaterol] Other (See Comments)    Mouth ulcers  . Tape Dermatitis    Paper tape and clear  . Trelegy Ellipta [Fluticasone-Umeclidin-Vilant] Other (See Comments)    headaches    ROS As per HPI  PE: There were no vitals taken for this visit. ***  LABS:  ***  IMPRESSION AND PLAN:  No problem-specific Assessment & Plan notes found for this encounter.   An After Visit Summary was printed and given to the patient.  FOLLOW UP: No follow-ups on file.

## 2019-07-30 ENCOUNTER — Other Ambulatory Visit: Payer: Self-pay

## 2019-07-30 ENCOUNTER — Ambulatory Visit (INDEPENDENT_AMBULATORY_CARE_PROVIDER_SITE_OTHER): Payer: Commercial Managed Care - PPO | Admitting: Licensed Clinical Social Worker

## 2019-07-30 ENCOUNTER — Encounter (HOSPITAL_COMMUNITY): Payer: Self-pay | Admitting: Licensed Clinical Social Worker

## 2019-07-30 DIAGNOSIS — F418 Other specified anxiety disorders: Secondary | ICD-10-CM | POA: Diagnosis not present

## 2019-07-30 DIAGNOSIS — F4310 Post-traumatic stress disorder, unspecified: Secondary | ICD-10-CM
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
## 2019-08-04 ENCOUNTER — Other Ambulatory Visit: Payer: Self-pay | Admitting: Family Medicine

## 2019-08-06 ENCOUNTER — Ambulatory Visit (INDEPENDENT_AMBULATORY_CARE_PROVIDER_SITE_OTHER): Payer: Commercial Managed Care - PPO | Admitting: Licensed Clinical Social Worker

## 2019-08-06 ENCOUNTER — Encounter (HOSPITAL_COMMUNITY): Payer: Self-pay | Admitting: Licensed Clinical Social Worker

## 2019-08-06 ENCOUNTER — Other Ambulatory Visit: Payer: Self-pay

## 2019-08-06 DIAGNOSIS — F4310 Post-traumatic stress disorder, unspecified: Secondary | ICD-10-CM | POA: Diagnosis not present
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
## 2019-08-10 ENCOUNTER — Ambulatory Visit
Admission: RE | Admit: 2019-08-10 | Discharge: 2019-08-10 | Disposition: A | Discharge status: Home / Self Care | Payer: Commercial Managed Care - PPO | Source: Ambulatory Visit | Attending: Neurology | Admitting: Neurology

## 2019-08-10 DIAGNOSIS — R413 Other amnesia: Secondary | ICD-10-CM

## 2019-08-12 ENCOUNTER — Ambulatory Visit (INDEPENDENT_AMBULATORY_CARE_PROVIDER_SITE_OTHER): Payer: Commercial Managed Care - PPO | Admitting: Licensed Clinical Social Worker

## 2019-08-12 ENCOUNTER — Encounter (HOSPITAL_COMMUNITY): Payer: Self-pay | Admitting: Licensed Clinical Social Worker

## 2019-08-12 ENCOUNTER — Other Ambulatory Visit: Payer: Self-pay

## 2019-08-12 DIAGNOSIS — F418 Other specified anxiety disorders: Secondary | ICD-10-CM

## 2019-08-12 DIAGNOSIS — F4310 Post-traumatic stress disorder, unspecified: Secondary | ICD-10-CM
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
## 2019-08-13 ENCOUNTER — Ambulatory Visit (INDEPENDENT_AMBULATORY_CARE_PROVIDER_SITE_OTHER): Payer: Commercial Managed Care - PPO | Admitting: Neurology

## 2019-08-13 ENCOUNTER — Other Ambulatory Visit: Payer: Self-pay

## 2019-08-13 ENCOUNTER — Encounter: Payer: Self-pay | Admitting: Neurology

## 2019-08-13 VITALS — BP 134/82 | HR 83 | Ht 60.0 in | Wt 249.0 lb

## 2019-08-13 DIAGNOSIS — R269 Unspecified abnormalities of gait and mobility: Secondary | ICD-10-CM | POA: Diagnosis not present

## 2019-08-13 DIAGNOSIS — M5440 Lumbago with sciatica, unspecified side: Secondary | ICD-10-CM

## 2019-08-13 DIAGNOSIS — R531 Weakness: Secondary | ICD-10-CM
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
## 2019-08-15 ENCOUNTER — Encounter: Payer: Self-pay | Admitting: Neurology

## 2019-08-17 ENCOUNTER — Other Ambulatory Visit (HOSPITAL_COMMUNITY): Payer: Self-pay | Admitting: *Deleted

## 2019-08-17 LAB — VITAMIN B12: Vitamin B-12: 306 pg/mL (ref 232–1245)

## 2019-08-17 LAB — ACETYLCHOLINE RECEPTOR, BINDING: AChR Binding Ab, Serum: <0.03 nmol/L (ref 0.00–0.24)

## 2019-08-17 LAB — TSH: TSH: 1.12 u[IU]/mL (ref 0.450–4.500)

## 2019-08-17 LAB — CK: Total CK: 34 U/L (ref 32–182)

## 2019-08-18 ENCOUNTER — Other Ambulatory Visit (HOSPITAL_COMMUNITY): Payer: Self-pay | Admitting: *Deleted

## 2019-08-18 DIAGNOSIS — R911 Solitary pulmonary nodule: Secondary | ICD-10-CM

## 2019-08-19 ENCOUNTER — Encounter (HOSPITAL_COMMUNITY): Payer: Self-pay | Admitting: Licensed Clinical Social Worker

## 2019-08-19 ENCOUNTER — Other Ambulatory Visit: Payer: Self-pay

## 2019-08-19 ENCOUNTER — Ambulatory Visit (INDEPENDENT_AMBULATORY_CARE_PROVIDER_SITE_OTHER): Payer: Commercial Managed Care - PPO | Admitting: Licensed Clinical Social Worker

## 2019-08-19 DIAGNOSIS — F4310 Post-traumatic stress disorder, unspecified: Secondary | ICD-10-CM

## 2019-08-19 DIAGNOSIS — F418 Other specified anxiety disorders: Secondary | ICD-10-CM | POA: Diagnosis not present

## 2019-08-19 NOTE — Progress Notes (Signed)
Virtual Visit via Video Note  I connected with Martha Espinoza on 08/19/19 at  2:30 PM EDT by a video enabled telemedicine application and verified that I am speaking with the correct person using two identifiers.  Location: Patient: home Provider: office   I discussed the limitations of evaluation and management by telemedicine and the availability of in person appointments. The patient expressed understanding and agreed to proceed.  Type of Therapy: Individual Therapy   Treatment Goals addressed: "PTSD, depression and anxiety. I would like to feel more like myself" .  Interventions:  CBT   Summary: Martha Espinoza is a 54 y.o. female who presents with PTSD and Depression with Anxiety.   Suicidal/Homicidal: No without intent/plan   Therapist Response: Martha Espinoza met with clinician for an individual session. Martha Espinoza shared about her psychiatric symptoms, her current life events and her homework. Martha Espinoza shared that she was feeling down today. Clinician utilized CBT to explore trigger to low mood, thoughts, feelings, and behaviors. Clinician identified feeling like she was a burden to her husband and feeling some concern that one day he will not want to take care of her anymore. Clinician processed thoughts and feelings, utilized reality testing about what is communicated to her by her husband. Clinician encouraged Martha Espinoza to do what she can to help her caregivers. Clinician also identified that the caregivers do what they do for her because they are family and they love her.  Clinician processed recent interactions with granddaughter. Clinician normalized feelings of guilt about not being able to get out of bed much or get on the floor to play. Clinician again utilized reality testing to let Martha Espinoza know that her granddaughter just wants the time together and the emotional and physical closeness.   Plan: Return again in 1-2 weeks.   Diagnosis: Axis I: PTSD and Depression with Anxiety.     I discussed  the assessment and treatment plan with the patient. The patient was provided an opportunity to ask questions and all were answered. The patient agreed with the plan and demonstrated an understanding of the instructions.   The patient was advised to call back or seek an in-person evaluation if the symptoms worsen or if the condition fails to improve as anticipated.  I provided 55 minutes of non-face-to-face time during this encounter.   [name], LCSW

## 2019-08-20 ENCOUNTER — Ambulatory Visit: Payer: Commercial Managed Care - PPO | Admitting: Neurology

## 2019-08-21 ENCOUNTER — Telehealth: Payer: Commercial Managed Care - PPO | Admitting: Family Medicine

## 2019-08-26 ENCOUNTER — Encounter (HOSPITAL_COMMUNITY): Payer: Self-pay | Admitting: Licensed Clinical Social Worker

## 2019-08-26 ENCOUNTER — Other Ambulatory Visit: Payer: Self-pay

## 2019-08-26 ENCOUNTER — Ambulatory Visit (INDEPENDENT_AMBULATORY_CARE_PROVIDER_SITE_OTHER): Payer: Commercial Managed Care - PPO | Admitting: Licensed Clinical Social Worker

## 2019-08-26 DIAGNOSIS — F418 Other specified anxiety disorders: Secondary | ICD-10-CM

## 2019-08-26 DIAGNOSIS — F4310 Post-traumatic stress disorder, unspecified: Secondary | ICD-10-CM

## 2019-08-26 NOTE — Progress Notes (Signed)
Virtual Visit via Video Note  I connected with ARASELY AKKERMAN on 08/26/19 at  1:30 PM EDT by a video enabled telemedicine application and verified that I am speaking with the correct person using two identifiers.  Location: Patient: home Provider: home office   I discussed the limitations of evaluation and management by telemedicine and the availability of in person appointments. The patient expressed understanding and agreed to proceed.  Type of Therapy: Individual Therapy   Treatment Goals addressed: "PTSD, depression and anxiety. I would like to feel more like myself"  Interventions:  CBT   Summary: [name] Boies is a 54 y.o. female who presents with PTSD and Depression with Anxiety.   Suicidal/Homicidal: No without intent/plan   Therapist Response: Almyra Free met with clinician for an individual session. Snigdha shared about her psychiatric symptoms, her current life events and her homework. Bekki shared that she has not been sleeping much this week, noting that she had been awake for about 28 hours at the time of this session. Clinician explored trigger to the current anxiety, which has caused this sleeplessness. Clinician utilized CBT to provide psychoeducation about the impact of traumatic memories on sleep. Clinician explored memories, interactions, and current feelings of self-worth. Clinician provided time and space for Cindi to process traumatic events. Clinician reflected the impact of these incidents on Kellen's self worth and sense of cleanliness. Clinician provided positive feedback about the importance of feeling good about herself, both logically and emotionally.    Plan: Return again in 1-2 weeks.   Diagnosis: Axis I: PTSD and Depression with Anxiety.         I discussed the assessment and treatment plan with the patient. The patient was provided an opportunity to ask questions and all were answered. The patient agreed with the plan and demonstrated an understanding of the  instructions.   The patient was advised to call back or seek an in-person evaluation if the symptoms worsen or if the condition fails to improve as anticipated.  I provided 60 minutes of non-face-to-face time during this encounter.   [name], LCSW

## 2019-08-27 ENCOUNTER — Telehealth: Payer: Self-pay | Admitting: Neurology

## 2019-08-27 NOTE — Telephone Encounter (Signed)
[facility] Auth: NPR Ref # Max M/medicare order sent to GI. They will reach out to the patient to schedule.

## 2019-08-28 ENCOUNTER — Other Ambulatory Visit: Payer: Self-pay

## 2019-08-28 ENCOUNTER — Encounter (HOSPITAL_COMMUNITY): Payer: Self-pay | Admitting: Psychiatry

## 2019-08-28 ENCOUNTER — Telehealth: Payer: Commercial Managed Care - PPO | Admitting: Psychiatry

## 2019-08-28 ENCOUNTER — Telehealth (INDEPENDENT_AMBULATORY_CARE_PROVIDER_SITE_OTHER): Payer: Commercial Managed Care - PPO | Admitting: Psychiatry

## 2019-08-28 DIAGNOSIS — F418 Other specified anxiety disorders: Secondary | ICD-10-CM | POA: Diagnosis not present

## 2019-08-28 DIAGNOSIS — F4310 Post-traumatic stress disorder, unspecified: Secondary | ICD-10-CM

## 2019-08-28 MED ORDER — BUSPIRONE HCL 15 MG PO TABS: 15.0000 mg | ORAL_TABLET | Freq: Two times a day (BID) | ORAL | 0 refills | Status: DC

## 2019-08-28 MED ORDER — SERTRALINE HCL 50 MG PO TABS: 50.0000 mg | ORAL_TABLET | Freq: Every day | ORAL | 0 refills | Status: DC

## 2019-08-28 MED ORDER — TRAZODONE HCL 150 MG PO TABS: 150.0000 mg | ORAL_TABLET | Freq: Every day | ORAL | 0 refills | Status: DC

## 2019-08-28 MED ORDER — LAMOTRIGINE 100 MG PO TABS: ORAL_TABLET | ORAL | 0 refills | Status: DC

## 2019-08-28 NOTE — Progress Notes (Signed)
[facility] MD OP Progress Note   Virtual Visit via Video Note  I connected with Martha Espinoza on 08/28/19 at 10:30 AM EDT by a video enabled telemedicine application and verified that I am speaking with the correct person using two identifiers.  Location: Patient: Home Provider: Clinic   I discussed the limitations of evaluation and management by telemedicine and the availability of in person appointments. The patient expressed understanding and agreed to proceed.  The session started off as a video encounter however had to be converted to phone encounter due to poor Internet connection.  I provided 18 minutes of non-face-to-face time during this encounter.   08/28/2019 10:36 AM Martha Espinoza  MRN:  [id number]  Chief Complaint: "I have been feeling very anxious for the past week."   HPI: Patient reported that she was doing relatively well for the past few weeks until about a week ago when she started to feel very anxious. When asked if she had any triggers or stressors, she replied that she thinks she has too many doctors appointments and all the things are overlapping each other. She stated that she is tired of all the tests that she is being made to go through. She also reported that her husband sister has been staying with them to help her due to her frequent falls over the past few months. She stated that she does not really like someone in her space and she wishes that they were not there.  She expressed frustration about all the tests that she is going through and having to wait for the results. PDMP reviewed during this encounter.  Patient reported that she has been taking clonazepam 0.5 mg 3 times daily as well as the buspirone 15 mg twice daily after her PCP cleared her to take them. She stated that she still feels anxious and does not know what to do. Writer advised her to utilize the techniques that have been given to her by her therapist to manage her anxiety and to focus on  distracting herself.   Visit Diagnosis:    ICD-10-CM   1. Depression with anxiety  F41.8   2. PTSD (post-traumatic stress disorder)  F43.10     Past Psychiatric History: Depression, anxiety, PTSD  Past Medical History:  Past Medical History:  Diagnosis Date  . Abnormal CT of thoracic spine 09/12/2018   Noncontrast.  Increased in number and TINY increase in size of tiny, patchy sclerotic T spine lesions compared to 2018 CT.  Hem/onc summer 2020->w/u->suspected benign bone islands->survellance imaging in 6-12 mo planned.  . Allergic rhinitis   . Arthritis   . Asthma   . Bipolar 1 disorder ([place])   . Chronic pain syndrome    chronic low back pain (Pain mgmt= Dr. [name]).  MRI showed no signif disc dz, only showed some facet arthropathy at L4-5, with joint diastasis at L4-5--plan as of 11/07/16 neurosurg eval is bilat facet injections. (chronic LBP+ fibromyalgia)  . Colitis due to Clostridium difficile 2001  . COPD (chronic obstructive pulmonary disease) ([place])    PFTs 04/2016 showed no sign of copd or asthma, plus she actually had worse FEV1 after taking albuterol.  However, as of 06/2018 she has recent hx of improvement in sx's after taking albuterol.  LONG TIME SMOKER, won't quit.  Intolerant to or cannot afford: pulmicort, QVAR, advair, symbicort, flovent, bevespi, and spiriva, anoro,stiolto, and trelegy. Pulmicort trial per pulm 12/2018  . Depression   . Fibromyalgia syndrome   . GAD (  generalized anxiety disorder)   . GERD (gastroesophageal reflux disease)   . Hepatic hemangioma   . Hepatic steatosis 06/2016   Noted incidentally on CT chest  . History of Salmonella gastroenteritis   . Hoarseness    chronic: laryngoscopy showed edema of vocal folds and R focal fold leukoplakia->>NEEDS TO QUIT SMOKING! Rpt laryngoscopy 03/17/19 showed the leukoplakia spot smaller->to be followed with serial exams by ent. 06/15/19->MUCH imroved vocal fold edema and vocal cord leukoplakia: less hoarseness  with smoking cessation!  . Hyperlipidemia    a. Noted 02/2014.  Marland Kitchen Hypertension   . IBS (irritable bowel syndrome)   . Iron deficiency anemia 05/2014   Per pt it is not due to vaginal blood loss; hemoccults sent to pt in mail 413/16.  . Lumbar spondylosis    Mild, mainly focused at L4-5 and L5-S1.  MRI L spine 09/09/17-->mild degenerative disc disease and facet arthrosis without any nerve root encroachment, no spinal stenosis.  . Microscopic hematuria    Intermittent (no w/u done yet, as of 03/2014)  . Migraine headache   . Morbid obesity ([place])   . Multiple lipomas 09/2016   Left upper arm--very small ones.  . Nocturnal hypoxia 09/18/2017   Overnight oximetry -->qualified: ordered 2L oxygen during sleep.  . Obesity hypoventilation syndrome ([place])   . Pain in both lower legs 2018-2019   Pain and numbness bilat LLs.  NCS/EMGs findings suggestive of S1 radiculopathy bilat: sx's + NCS findings suggestive of neurogenic claudication/spinal stenosis.  ? contribution of venous reflux?-->"idiopathic peripheral neuropathy, progressive->neurol/pain mgmt  . Peripheral edema Fall 2018   R>L, non-pitting for the most part--venous doppler neg for DVT 01/2017.  Saw Dr. Bridgett Larsson (Vasc) 04/12/17 and u/s showed some venous reflux dz but he felt her sx's in legs were NOT due to her venous insufficiency OR PAD.  Thigh high comp stockings rx'd.  . Polycystic ovarian syndrome   . Pruritic dermatitis 2015/2016   +Scabies prep at [facility] 07/2014. (Primarily pruritic skin, but eventually a subtle rash as well)  . PTSD (post-traumatic stress disorder)   . Raynaud's phenomenon 2019  . Rectal cancer ([place])    a. Followed by Dr. Gala Romney, dx 2000-2001.  Surveillance colonoscopy planned as of 11/2018 GI f/u.  . Right shoulder pain 2017   Murphy/Wainer: RC bursitis + RC tear (MRI)--injection trial is plan per pt report 05/04/16.  . Vitamin D deficiency   . Wheezing    Most of her resp symptoms are c/w upper airway  etiology (repeated PFTs ->no obstructive dz). Pulm referred her to ENT 12/2018.    Past Surgical History:  Procedure Laterality Date  . ABI's complete Bilateral 09/30/2017   Normal ABIs.  Great toe pressures adequate for wound healing.  Femoral artery waveforms triphasic (normal).  . CARDIOVASCULAR STRESS TEST  03/24/14   Lexscan MIBI: mild anterior ischemia?  Cardiac CT angiogram recommended/done.  . CHOLECYSTECTOMY    . COLONOSCOPY  05/05/2007   adenoma  . coronary CT angio  03/29/14   Two vessel dz/moderate stenosis of mid LAD and proximal RCA; cath recommended.  Marland Kitchen DILATION AND CURETTAGE OF UTERUS     for vaginal bleeding  . EVENT MONITOR  04/2019   No signif abnormality (Dr. Domenic Polite)  . JOINT REPLACEMENT    . laryngoscopy  02/11/2019   (Done b/c of hoarseness and hx of wheezing"Reinke's edema of vocal folds, right vocal cord leukoplakia-->plan for bx of leukoplakia->NEEDS TO QUIT SMOKING!  . LEFT HEART CATHETERIZATION WITH CORONARY ANGIOGRAM  N/A 03/31/2014   Normal coronaries, EF 86-76%, diastolic dysfunction.  Procedure: LEFT HEART CATHETERIZATION WITH CORONARY ANGIOGRAM;  Surgeon: Sinclair Grooms, MD;  Location: [facility] CATH LAB;  Service: Cardiovascular;  Laterality: N/A;  . LOWER EXT VENOUS DOPPLERS Bilateral 02/08/2017   NEG for DVT (bilat)  . NCS/EMG  10/03/2017   S1 radiculopathy bilat  . Overnight oximetry testing  09/2017   qualified for oxygen supplementation during sleep  . PFTs  04/2016   No sign of COPD or asthma; FEV 1 worse after albut.  . rectal mss  age 13   High-grade rectal adenoma removed from rectum   . TRANSTHORACIC ECHOCARDIOGRAM  03/23/14; 10/21/17; 05/12/19   2015 Normal.  2019: mild LVH, EF 65-70%, study technically inadequate to assess diastolic function, valves were fine, wall motion normal. 2021 EF 60-65%-->all normal.    Family Psychiatric History: See below  Family History:  Family History  Problem Relation Age of Onset  . Colon cancer Father 8  .  Cancer Father   . Heart disease Mother   . Hypertension Mother   . Hyperlipidemia Mother   . Mental illness Mother   . Diabetes Mother   . Heart attack Mother   . Thyroid disease Mother   . Anxiety disorder Mother   . Depression Mother   . Colon cancer Maternal Grandfather   . Bipolar disorder Son   . Stroke Neg Hx     Social History:  Social History   Socioeconomic History  . Marital status: Married    Spouse name: tracey  . Number of children: 1  . Years of education: Not on file  . Highest education level: 9th grade  Occupational History  . Occupation: own Games developer: [employer]  Tobacco Use  . Smoking status: Former Smoker    Packs/day: 1.00    Years: 18.00    Pack years: 18.00    Types: Cigarettes  . Smokeless tobacco: Never Used  . Tobacco comment: 25 years  Substance and Sexual Activity  . Alcohol use: No    Alcohol/week: 0.0 standard drinks  . Drug use: No  . Sexual activity: Not Currently    Birth control/protection: None, Surgical  Other Topics Concern  . Not on file  Social History Narrative   A friend   Social Determinants of Health   Financial Resource Strain: Low Risk   . Difficulty of Paying Living Expenses: Not hard at all  Food Insecurity: No Food Insecurity  . Worried About Charity fundraiser in the Last Year: Never true  . Ran Out of Food in the Last Year: Never true  Transportation Needs: No Transportation Needs  . Lack of Transportation (Medical): No  . Lack of Transportation (Non-Medical): No  Physical Activity: Inactive  . Days of Exercise per Week: 0 days  . Minutes of Exercise per Session: 0 min  Stress: Stress Concern Present  . Feeling of Stress : Very much  Social Connections: Moderately Isolated  . Frequency of Communication with Friends and Family: Once a week  . Frequency of Social Gatherings with Friends and Family: Once a week  . Attends Religious Services: Never  . Active Member of  Clubs or Organizations: No  . Attends Archivist Meetings: Never  . Marital Status: Married    Allergies:  Allergies  Allergen Reactions  . Contrast Media [Iodinated Diagnostic Agents] Anaphylaxis    Needs 13-hour prep  . Shellfish Allergy Anaphylaxis  .  Codeine Nausea And Vomiting and Other (See Comments)    Hallucinations, Also sees people that are not there   . Furosemide Rash    Rash, soB.  Marland Kitchen Anoro Ellipta [Umeclidinium-Vilanterol] Other (See Comments)    Pt states it caused blisters.  . Flovent Diskus [Fluticasone Propionate (Inhal)]     headache  . Ivermectin Nausea And Vomiting    N/V and rash  . Other   . Spiriva Handihaler [Tiotropium Bromide Monohydrate] Other (See Comments)    Severe headaches  . Symbicort [Budesonide-Formoterol Fumarate]     headache  . Betadine [Povidone Iodine] Rash  . Latex Hives  . Lipitor [Atorvastatin] Itching  . Permethrin Itching  . Stiolto Respimat [Tiotropium Bromide-Olodaterol] Other (See Comments)    Mouth ulcers  . Tape Dermatitis    Paper tape and clear  . Trelegy Ellipta [Fluticasone-Umeclidin-Vilant] Other (See Comments)    headaches    Metabolic Disorder Labs: Lab Results  Component Value Date   HGBA1C 5.8 09/24/2018   MPG 123 (H) 03/23/2014   No results found for: PROLACTIN Lab Results  Component Value Date   CHOL 186 05/27/2019   TRIG 264.0 (H) 05/27/2019   HDL 33.80 (L) 05/27/2019   CHOLHDL 6 05/27/2019   VLDL 52.8 (H) 05/27/2019   LDLCALC 144 (H) 02/06/2019   LDLCALC 169 (H) 03/24/2014   Lab Results  Component Value Date   TSH 1.120 08/13/2019   TSH 1.12 10/08/2016    Therapeutic Level Labs: No results found for: LITHIUM No results found for: VALPROATE No components found for:  CBMZ  Current Medications: Current Outpatient Medications  Medication Sig Dispense Refill  . albuterol (PROVENTIL) (2.5 MG/3ML) 0.083% nebulizer solution Take 3 mLs (2.5 mg total) by nebulization every 6 (six)  hours as needed for wheezing or shortness of breath. 75 mL 6  . albuterol (VENTOLIN HFA) 108 (90 Base) MCG/ACT inhaler INHALE 1 TO 2 PUFFS BY MOUTH EVERY 4 HOURS AS NEEDED (Patient taking differently: Inhale 1-2 puffs into the lungs 4 (four) times daily. ) 16 g 11  . AMITIZA 24 MCG capsule Take 24 mcg by mouth 2 (two) times daily.  1  . aspirin 81 MG chewable tablet Chew 1 tablet (81 mg total) by mouth daily. (Patient taking differently: Chew 81 mg by mouth every evening. ) 14 tablet 0  . azelastine (ASTELIN) 0.1 % nasal spray Place 2 sprays into both nostrils 2 (two) times daily. 30 mL 6  . busPIRone (BUSPAR) 15 MG tablet Take 1 tablet (15 mg total) by mouth 2 (two) times daily. 180 tablet 0  . clonazePAM (KLONOPIN) 0.5 MG tablet TAKE 1 TABLET BY MOUTH DURING THE DAY AS NEEDED AND 2 AT BEDTIME 90 tablet 5  . cyclobenzaprine (FLEXERIL) 10 MG tablet TAKE ONE TABLET BY MOUTH EVERY 6 HOURS AS NEEDED (SPASM RELATED TO HEADACHES) 42 tablet 5  . EQ ALLERGY RELIEF, CETIRIZINE, 10 MG tablet Take 1 tablet by mouth once daily 90 tablet 0  . fluconazole (DIFLUCAN) 150 MG tablet fluconazole 150 mg tablet  TAKE ONE TABLET BY MOUTH AS A ONE TIME DOSE    . fluticasone (FLONASE) 50 MCG/ACT nasal spray Place 2 sprays into both nostrils daily as needed for allergies.    Marland Kitchen lamoTRIgine (LAMICTAL) 100 MG tablet Take 2 tablets at bedtime 180 tablet 0  . losartan (COZAAR) 50 MG tablet Take 1 tablet by mouth once daily (Patient not taking: Reported on 08/13/2019) 90 tablet 0  . meloxicam (MOBIC) 15 MG tablet  Take 15 mg by mouth every evening.   1  . metoprolol tartrate (LOPRESSOR) 50 MG tablet Take 1 tablet by mouth twice daily 60 tablet 0  . Naldemedine Tosylate (SYMPROIC) 0.2 MG TABS Take 0.2 mg by mouth at bedtime.     . nitroGLYCERIN (NITROSTAT) 0.4 MG SL tablet DISSOLVE ONE TABLET UNDER THE TONGUE EVERY 5 MINUTES AS NEEDED FOR CHEST PAIN.  DO NOT EXCEED A TOTAL OF 3 DOSES IN 15 MINUTES 10 tablet 0  .  oxyCODONE-acetaminophen (PERCOCET) 10-325 MG tablet Take 1 tablet by mouth in the morning, at noon, and at bedtime.     . pantoprazole (PROTONIX) 40 MG tablet Take 1 tablet by mouth once daily 90 tablet 0  . Potassium (POTASSIMIN PO) Take 2 tablets by mouth at bedtime.    . pregabalin (LYRICA) 200 MG capsule Take 200 mg by mouth daily.     . promethazine (PHENERGAN) 25 MG tablet TAKE 1 TABLET BY MOUTH EVERY 6 HOURS AS NEEDED FOR NAUSEA 30 tablet 0  . rOPINIRole (REQUIP) 0.25 MG tablet TAKE 3 TABLETS BY MOUTH AT BEDTIME 270 tablet 0  . rosuvastatin (CRESTOR) 20 MG tablet Take 1 tablet (20 mg total) by mouth daily. 30 tablet 3  . sertraline (ZOLOFT) 50 MG tablet Take 1 tablet (50 mg total) by mouth daily. 90 tablet 0  . topiramate (TOPAMAX) 50 MG tablet Take 50 mg by mouth as needed.     . traZODone (DESYREL) 150 MG tablet Take 1 tablet (150 mg total) by mouth at bedtime. 30 tablet 0   No current facility-administered medications for this visit.     Musculoskeletal: Strength & Muscle Tone: unable to assess due to telemed visit Gait & Station: unable to assess due to telemed visit Patient leans: unable to assess due to telemed visit  Psychiatric Specialty Exam: Review of Systems  There were no vitals taken for this visit.There is no height or weight on file to calculate BMI.  General Appearance: fairly groomed  Eye Contact:  good  Speech:  Clear and Coherent and Normal Rate  Volume:  Normal  Mood:  Anxious  Affect:  Congruent  Thought Process:  Goal Directed, Linear and Descriptions of Associations: Intact  Orientation:  Full (Time, Place, and Person)  Thought Content: Logical   Suicidal Thoughts:  No  Homicidal Thoughts:  No  Memory:  Immediate;   Good Recent;   Fair Remote;   Fair  Judgement:  Fair  Insight:  Fair  Psychomotor Activity:  Normal  Concentration:  Concentration: Good and Attention Span: Good  Recall:  Good  Fund of Knowledge: Fair  Language: Good  Akathisia:   Negative  Handed:  Right  AIMS (if indicated): not done   Assets:  Communication Skills Desire for Improvement Financial Resources/Insurance Housing Social Support  ADL's:  Intact  Cognition: WNL  Sleep:  Fair   Screenings: PHQ2-9     Office Visit from 08/16/2017 in [place] Visit from 07/17/2017 in [place]  PHQ-2 Total Score  2  6  PHQ-9 Total Score  18  18       Assessment and Plan: Patient reported extreme anxiety due to ongoing medical issues and consequent appointments and ongoing Investigative work. She has been taking all her medications as prescribed. She has been seeing a therapist regularly. She was advised to continue to take the same regimen for now and continue individual therapy sessions. Reassurance was provided.  1. PTSD (post-traumatic stress disorder)  - busPIRone (BUSPAR) 15 MG tablet; Take 1 tablet (15 mg total) by mouth 2 (two) times daily.  Dispense: 180 tablet; Refill: 0 - sertraline (ZOLOFT) 50 MG tablet; Take 1 tablet (50 mg total) by mouth daily.  Dispense: 90 tablet; Refill: 0  2. Depression with anxiety  - busPIRone (BUSPAR) 15 MG tablet; Take 1 tablet (15 mg total) by mouth 2 (two) times daily.  Dispense: 180 tablet; Refill: 0 - traZODone (DESYREL) 150 MG tablet; Take 1 tablet (150 mg total) by mouth at bedtime.  Dispense: 30 tablet; Refill: 0 - lamoTRIgine (LAMICTAL) 100 MG tablet; Take 2 tablets at bedtime  Dispense: 180 tablet; Refill: 0 - sertraline (ZOLOFT) 50 MG tablet; Take 1 tablet (50 mg total) by mouth daily.  Dispense: 90 tablet; Refill: 0   Patient is also taking clonazepam 0.5 mg 3 times daily prescribed by her PCP and oxycodone 10 mg prescribed by her pain management provider. Continue individual therapy. Follow-up in 4 weeks.  [place] Crane, MD 08/28/2019, 10:35 AM

## 2019-08-31 ENCOUNTER — Inpatient Hospital Stay (HOSPITAL_COMMUNITY): Payer: Commercial Managed Care - PPO | Attending: Hematology

## 2019-08-31 ENCOUNTER — Other Ambulatory Visit: Payer: Self-pay

## 2019-08-31 ENCOUNTER — Ambulatory Visit (HOSPITAL_COMMUNITY)
Admission: RE | Admit: 2019-08-31 | Discharge: 2019-08-31 | Disposition: A | Discharge status: Home / Self Care | Payer: Commercial Managed Care - PPO | Source: Ambulatory Visit | Attending: Hematology | Admitting: Hematology

## 2019-08-31 DIAGNOSIS — R911 Solitary pulmonary nodule: Secondary | ICD-10-CM | POA: Insufficient documentation

## 2019-08-31 DIAGNOSIS — F17210 Nicotine dependence, cigarettes, uncomplicated: Secondary | ICD-10-CM | POA: Insufficient documentation

## 2019-08-31 DIAGNOSIS — M899 Disorder of bone, unspecified: Secondary | ICD-10-CM | POA: Insufficient documentation

## 2019-08-31 DIAGNOSIS — K760 Fatty (change of) liver, not elsewhere classified: Secondary | ICD-10-CM | POA: Diagnosis not present

## 2019-08-31 LAB — CBC WITH DIFFERENTIAL/PLATELET
Abs Immature Granulocytes: 0.02 10*3/uL (ref 0.00–0.07)
Basophils Absolute: 0 10*3/uL (ref 0.0–0.1)
Basophils Relative: 0 %
Eosinophils Absolute: 0.1 10*3/uL (ref 0.0–0.5)
Eosinophils Relative: 1 %
HCT: 47.8 % — ABNORMAL HIGH (ref 36.0–46.0)
Hemoglobin: 15.7 g/dL — ABNORMAL HIGH (ref 12.0–15.0)
Immature Granulocytes: 0 %
Lymphocytes Relative: 30 %
Lymphs Abs: 2.6 10*3/uL (ref 0.7–4.0)
MCH: 30.3 pg (ref 26.0–34.0)
MCHC: 32.8 g/dL (ref 30.0–36.0)
MCV: 92.3 fL (ref 80.0–100.0)
Monocytes Absolute: 0.4 10*3/uL (ref 0.1–1.0)
Monocytes Relative: 5 %
Neutro Abs: 5.6 10*3/uL (ref 1.7–7.7)
Neutrophils Relative %: 64 %
Platelets: 261 10*3/uL (ref 150–400)
RBC: 5.18 MIL/uL — ABNORMAL HIGH (ref 3.87–5.11)
RDW: 14.4 % (ref 11.5–15.5)
WBC: 8.8 10*3/uL (ref 4.0–10.5)
nRBC: 0 % (ref 0.0–0.2)

## 2019-08-31 LAB — COMPREHENSIVE METABOLIC PANEL
ALT: 10 U/L (ref 0–44)
AST: 10 U/L — ABNORMAL LOW (ref 15–41)
Albumin: 4.2 g/dL (ref 3.5–5.0)
Alkaline Phosphatase: 69 U/L (ref 38–126)
Anion gap: 11 (ref 5–15)
BUN: 11 mg/dL (ref 6–20)
CO2: 28 mmol/L (ref 22–32)
Calcium: 9.3 mg/dL (ref 8.9–10.3)
Chloride: 101 mmol/L (ref 98–111)
Creatinine, Ser: 0.58 mg/dL (ref 0.44–1.00)
GFR calc Af Amer: >60 mL/min (ref >60)
GFR calc non Af Amer: >60 mL/min (ref >60)
Glucose, Bld: 115 mg/dL — ABNORMAL HIGH (ref 70–99)
Potassium: 3.9 mmol/L (ref 3.5–5.1)
Sodium: 140 mmol/L (ref 135–145)
Total Bilirubin: 0.5 mg/dL (ref 0.3–1.2)
Total Protein: 7.2 g/dL (ref 6.5–8.1)

## 2019-08-31 LAB — LACTATE DEHYDROGENASE: LDH: 90 U/L — ABNORMAL LOW (ref 98–192)

## 2019-09-01 ENCOUNTER — Ambulatory Visit (HOSPITAL_COMMUNITY): Payer: Commercial Managed Care - PPO | Admitting: Hematology

## 2019-09-01 LAB — KAPPA/LAMBDA LIGHT CHAINS
Kappa free light chain: 13.1 mg/L (ref 3.3–19.4)
Kappa, lambda light chain ratio: 1.85 — ABNORMAL HIGH (ref 0.26–1.65)
Lambda free light chains: 7.1 mg/L (ref 5.7–26.3)

## 2019-09-02 ENCOUNTER — Ambulatory Visit (INDEPENDENT_AMBULATORY_CARE_PROVIDER_SITE_OTHER): Payer: Commercial Managed Care - PPO | Admitting: Licensed Clinical Social Worker

## 2019-09-02 ENCOUNTER — Encounter (HOSPITAL_COMMUNITY): Payer: Self-pay | Admitting: Licensed Clinical Social Worker

## 2019-09-02 ENCOUNTER — Other Ambulatory Visit: Payer: Self-pay

## 2019-09-02 DIAGNOSIS — F4310 Post-traumatic stress disorder, unspecified: Secondary | ICD-10-CM

## 2019-09-02 DIAGNOSIS — F418 Other specified anxiety disorders: Secondary | ICD-10-CM

## 2019-09-02 LAB — PROTEIN ELECTROPHORESIS, SERUM
A/G Ratio: 1.3 (ref 0.7–1.7)
Albumin ELP: 3.8 g/dL (ref 2.9–4.4)
Alpha-1-Globulin: 0.3 g/dL (ref 0.0–0.4)
Alpha-2-Globulin: 0.9 g/dL (ref 0.4–1.0)
Beta Globulin: 1 g/dL (ref 0.7–1.3)
Gamma Globulin: 0.7 g/dL (ref 0.4–1.8)
Globulin, Total: 2.9 g/dL (ref 2.2–3.9)
Total Protein ELP: 6.7 g/dL (ref 6.0–8.5)

## 2019-09-02 NOTE — Progress Notes (Signed)
Virtual Visit via Video Note  I connected with Martha Espinoza on 09/02/19 at  2:30 PM EDT by a video enabled telemedicine application and verified that I am speaking with the correct person using two identifiers.  Location: Patient: home Provider: office   I discussed the limitations of evaluation and management by telemedicine and the availability of in person appointments. The patient expressed understanding and agreed to proceed.   Type of Therapy: Individual Therapy   Treatment Goals addressed: "PTSD, depression and anxiety. I would like to feel more like myself"   Interventions:  CBT   Summary: Martha Espinoza is a 54 y.o. female who presents with PTSD and Depression with Anxiety.   Suicidal/Homicidal: No without intent/plan   Therapist Response: Almyra Free met with clinician for an individual session. Madolin shared about her psychiatric symptoms, her current life events and her homework. Ishitha shared that she continues to experience a lot of memories and thoughts about her traumatic time of living with her ex-boyfriend. Clinician utilized CBT to explore triggers to these memories and reflected that the further we get into trauma work, the more active those memories may be. Clinician provided time and space for Ketra to recount her trauma story, noting several incidents of controlling behavior and inability to contact and spend time with her friends and family. Clinician reflected the long term implications this had on relationships with family and friends. Clinician discussed theme of not being believed, which has become a significant trigger for Darling. Clinician identified that this has been problematic for most of her life, but it continues to be an issue.    Plan: Return again in 1-2 weeks.   Diagnosis: Axis I: PTSD and Depression with Anxiety.  I discussed the assessment and treatment plan with the patient. The patient was provided an opportunity to ask questions and all were answered.  The patient agreed with the plan and demonstrated an understanding of the instructions.   The patient was advised to call back or seek an in-person evaluation if the symptoms worsen or if the condition fails to improve as anticipated.  I provided 45 minutes of non-face-to-face time during this encounter.   [name], LCSW

## 2019-09-04 LAB — IMMUNOFIXATION ELECTROPHORESIS
IgA: 74 mg/dL — ABNORMAL LOW (ref 87–352)
IgG (Immunoglobin G), Serum: 657 mg/dL (ref 586–1602)
IgM (Immunoglobulin M), Srm: 59 mg/dL (ref 26–217)
Total Protein ELP: 6.6 g/dL (ref 6.0–8.5)

## 2019-09-05 ENCOUNTER — Other Ambulatory Visit: Payer: Self-pay | Admitting: Family Medicine

## 2019-09-05 ENCOUNTER — Other Ambulatory Visit: Payer: Self-pay | Admitting: Psychiatry

## 2019-09-05 DIAGNOSIS — F418 Other specified anxiety disorders: Secondary | ICD-10-CM

## 2019-09-05 DIAGNOSIS — F4310 Post-traumatic stress disorder, unspecified: Secondary | ICD-10-CM

## 2019-09-07 ENCOUNTER — Inpatient Hospital Stay (HOSPITAL_BASED_OUTPATIENT_CLINIC_OR_DEPARTMENT_OTHER): Payer: Commercial Managed Care - PPO | Admitting: Hematology

## 2019-09-07 ENCOUNTER — Other Ambulatory Visit: Payer: Self-pay

## 2019-09-07 DIAGNOSIS — M899 Disorder of bone, unspecified: Secondary | ICD-10-CM | POA: Diagnosis not present

## 2019-09-07 NOTE — Progress Notes (Signed)
Virtual Visit via Telephone Note  I connected with Martha Espinoza on 09/07/19 at 10:45 AM EDT by telephone and verified that I am speaking with the correct person using two identifiers.   I discussed the limitations, risks, security and privacy concerns of performing an evaluation and management service by telephone and the availability of in person appointments. I also discussed with the patient that there may be a patient responsible charge related to this service. The patient expressed understanding and agreed to proceed.   History of Present Illness: She is followed in our clinic for sclerotic bone lesions which were incidentally found on HRCT of the chest on 09/12/2018 throughout the thoracic spine, largest in the T9 and L1 vertebral bodies.  These lesions were present on CT of the abdomen and pelvis from 2017 but were small.  Bone scan on 10/06/2018 did not show any evidence of metastatic disease.   Observations/Objective: She denies any fevers, night sweats or weight loss.  Reports back pain which is rated as 7 out of 10 which is chronic.  Reports appetite and energy levels of 25%.  Chronic cough and shortness of breath are also stable.  Constipation and diarrhea alternating at times.  Assessment and Plan:  1.  Sclerotic bone lesions: -We reviewed CT chest from 08/31/2019 which showed widespread sclerotic lesions throughout the visualized axial and appendicular skeleton, similar to prior study from June 2020.  No findings to suggest interstitial lung disease.  Hepatic steatosis was present. -SPEP and immunofixation was normal.  Free light chain ratio was elevated at 1.85 with normal kappa and lambda light chains.  Hemoglobin is 15.7 and hematocrit 48.  Creatinine was 0.58 and calcium 9.3. -I plan to repeat her labs in 4 months with particular emphasis on free light chain ratio.  2.  Smoking history: -She smoked 1 pack/day for 25 years, quit for 5 years and currently smoking about 8 to 10  cigarettes/day.  She started smoking at age 54. -CT of the chest on 08/31/2019 did not show any lung lesions.   Follow Up Instructions: RTC 4 months with labs.   I discussed the assessment and treatment plan with the patient. The patient was provided an opportunity to ask questions and all were answered. The patient agreed with the plan and demonstrated an understanding of the instructions.   The patient was advised to call back or seek an in-person evaluation if the symptoms worsen or if the condition fails to improve as anticipated.  I provided 12 minutes of non-face-to-face time during this encounter.   Derek Jack, MD

## 2019-09-09 ENCOUNTER — Encounter (HOSPITAL_COMMUNITY): Payer: Self-pay | Admitting: Licensed Clinical Social Worker

## 2019-09-09 ENCOUNTER — Ambulatory Visit (INDEPENDENT_AMBULATORY_CARE_PROVIDER_SITE_OTHER): Payer: Commercial Managed Care - PPO | Admitting: Licensed Clinical Social Worker

## 2019-09-09 ENCOUNTER — Other Ambulatory Visit: Payer: Self-pay

## 2019-09-09 DIAGNOSIS — F4310 Post-traumatic stress disorder, unspecified: Secondary | ICD-10-CM | POA: Diagnosis not present

## 2019-09-09 DIAGNOSIS — F418 Other specified anxiety disorders: Secondary | ICD-10-CM

## 2019-09-09 NOTE — Progress Notes (Signed)
Virtual Visit via Video Note  I connected with Martha Espinoza on 09/09/19 at  2:30 PM EDT by a video enabled telemedicine application and verified that I am speaking with the correct person using two identifiers.  Location: Patient: home Provider: office   I discussed the limitations of evaluation and management by telemedicine and the availability of in person appointments. The patient expressed understanding and agreed to proceed.  Type of Therapy: Individual Therapy   Treatment Goals addressed: "PTSD, depression and anxiety. I would like to feel more like myself"   Interventions:  CBT, mindfulness   Summary: Martha Espinoza is a 54 y.o. female who presents with PTSD and Depression with Anxiety.   Suicidal/Homicidal: No without intent/plan   Therapist Response: Almyra Free met with clinician for an individual session. Martha Espinoza shared about her psychiatric symptoms, her current life events and her homework. Breannah shared ongoing low mood, depression, and distressing memories, coupled with anxiety about the future. Clinician utilized CBT to process thoughts, feelings, and behaviors. Clinician provided psychoeducation about trauma work and noted that ongoing and continuing communication about her trauma may effect her mood overall. However, clinician encouraged Martha Espinoza to keep up with weekly sessions to continue processing these feelings. Clinician reflected that Martha Espinoza has been holding these feelings and memories back for 10 years or more, so now they are coming out. Clinician processed traumatic experiences and reflected current challenges opening up to sister-in-law. Clinician identified cognitive distortions of projection and identified how this is getting in the way of her being able to open up. Clinician ended session with deep breathing and mindfulness in order to contain trauma work.    Plan: Return again in 1-2 weeks.   Diagnosis: Axis I: PTSD and Depression with Anxiety.    I discussed the  assessment and treatment plan with the patient. The patient was provided an opportunity to ask questions and all were answered. The patient agreed with the plan and demonstrated an understanding of the instructions.   The patient was advised to call back or seek an in-person evaluation if the symptoms worsen or if the condition fails to improve as anticipated.  I provided 50 minutes of non-face-to-face time during this encounter.   Martha Curling, LCSW

## 2019-09-11 ENCOUNTER — Ambulatory Visit: Payer: Commercial Managed Care - PPO | Admitting: Family Medicine

## 2019-09-11 DIAGNOSIS — Z0289 Encounter for other administrative examinations: Secondary | ICD-10-CM

## 2019-09-11 NOTE — Progress Notes (Deleted)
OFFICE VISIT  09/11/2019   CC: No chief complaint on file.    HPI:    Patient is a 54 y.o. Caucasian female who presents for f/u HTN, HLD, GAD (on benzo), and COPD. I last saw her about 2 mo ago. A/P as of last visit: "1) Altered mental status + recurrent falls; unclear etiology. Seems to have cleared since tx with cefdinir and stopping several of her meds. Definite psychosomatic component to things at this time. No new/additional w/u needed at this time. Keep with plan for MRI brain and C spine set for 08/10/19 (via her neurology provider). Instructions: Martha Espinoza your cefdinir (antibiotic).   Stay off of losartan unless you blood pressure goes to 140/90 or higher. Restart flonase daily and zyrtec daily. Each day you can restart a single medication in the following order: Ropinirole (restless legs med). Buspirone (anxiety med) Trazodone (for sleep) Cyclobenzaprine (muscle relaxer) Rosuvastatin (cholesterol med).  2) Allergic rhinitis, mild RAD. No sign of need for prednisone. Restart zyrtec and flonase.  3) Hx of high grade rectal adenoma. Due for colonoscopy with Dr. [name] and she is working on getting this arranged.  4) Chronic pain syndrome: mgmt as per [facility] neurology in [place].  5) Bipolar d/o, chronic anxiety, PTSD: ongoing psychiatry and counseling mgmt."  INTERIM HX: ***  Past Medical History:  Diagnosis Date  . Abnormal CT of thoracic spine 09/12/2018   Noncontrast.  Increased in number and TINY increase in size of tiny, patchy sclerotic T spine lesions compared to 2018 CT.  Hem/onc summer 2020->w/u->suspected benign bone islands->survellance imaging in 6-12 mo planned.  . Allergic rhinitis   . Arthritis   . Asthma   . Bipolar 1 disorder ([place])   . Chronic pain syndrome    chronic low back pain (Pain mgmt= Dr. [name]).  MRI showed no signif disc dz, only showed some facet arthropathy at L4-5, with joint diastasis at L4-5--plan as of 11/07/16  neurosurg eval is bilat facet injections. (chronic LBP+ fibromyalgia)  . Colitis due to Clostridium difficile 2001  . COPD (chronic obstructive pulmonary disease) ([place])    PFTs 04/2016 showed no sign of copd or asthma, plus she actually had worse FEV1 after taking albuterol.  However, as of 06/2018 she has recent hx of improvement in sx's after taking albuterol.  LONG TIME SMOKER, won't quit.  Intolerant to or cannot afford: pulmicort, QVAR, advair, symbicort, flovent, bevespi, and spiriva, anoro,stiolto, and trelegy. Pulmicort trial per pulm 12/2018  . Depression   . Fibromyalgia syndrome   . GAD (generalized anxiety disorder)   . GERD (gastroesophageal reflux disease)   . Hepatic hemangioma   . Hepatic steatosis 06/2016   Noted incidentally on CT chest  . History of Salmonella gastroenteritis   . Hoarseness    chronic: laryngoscopy showed edema of vocal folds and R focal fold leukoplakia->>NEEDS TO QUIT SMOKING! Rpt laryngoscopy 03/17/19 showed the leukoplakia spot smaller->to be followed with serial exams by ent. 06/15/19->MUCH imroved vocal fold edema and vocal cord leukoplakia: less hoarseness with smoking cessation!  . Hyperlipidemia    a. Noted 02/2014.  Marland Kitchen Hypertension   . IBS (irritable bowel syndrome)   . Iron deficiency anemia 05/2014   Per pt it is not due to vaginal blood loss; hemoccults sent to pt in mail 413/16.  . Lumbar spondylosis    Mild, mainly focused at L4-5 and L5-S1.  MRI L spine 09/09/17-->mild degenerative disc disease and facet arthrosis without any nerve root encroachment, no spinal stenosis.  Marland Kitchen  Microscopic hematuria    Intermittent (no w/u done yet, as of 03/2014)  . Migraine headache   . Morbid obesity ([place])   . Multiple lipomas 09/2016   Left upper arm--very small ones.  . Nocturnal hypoxia 09/18/2017   Overnight oximetry -->qualified: ordered 2L oxygen during sleep.  . Obesity hypoventilation syndrome ([place])   . Pain in both lower legs 2018-2019   Pain and  numbness bilat LLs.  NCS/EMGs findings suggestive of S1 radiculopathy bilat: sx's + NCS findings suggestive of neurogenic claudication/spinal stenosis.  ? contribution of venous reflux?-->"idiopathic peripheral neuropathy, progressive->neurol/pain mgmt  . Peripheral edema Fall 2018   R>L, non-pitting for the most part--venous doppler neg for DVT 01/2017.  Saw Dr. Bridgett Larsson (Vasc) 04/12/17 and u/s showed some venous reflux dz but he felt her sx's in legs were NOT due to her venous insufficiency OR PAD.  Thigh high comp stockings rx'd.  . Polycystic ovarian syndrome   . Pruritic dermatitis 2015/2016   +Scabies prep at [facility] 07/2014. (Primarily pruritic skin, but eventually a subtle rash as well)  . PTSD (post-traumatic stress disorder)   . Raynaud's phenomenon 2019  . Rectal cancer ([place])    a. Followed by Dr. Gala Romney, dx 2000-2001.  Surveillance colonoscopy planned as of 11/2018 GI f/u.  . Right shoulder pain 2017   Murphy/Wainer: RC bursitis + RC tear (MRI)--injection trial is plan per pt report 05/04/16.  . Vitamin D deficiency   . Wheezing    Most of her resp symptoms are c/w upper airway etiology (repeated PFTs ->no obstructive dz). Pulm referred her to ENT 12/2018.    Past Surgical History:  Procedure Laterality Date  . ABI's complete Bilateral 09/30/2017   Normal ABIs.  Great toe pressures adequate for wound healing.  Femoral artery waveforms triphasic (normal).  . CARDIOVASCULAR STRESS TEST  03/24/14   Lexscan MIBI: mild anterior ischemia?  Cardiac CT angiogram recommended/done.  . CHOLECYSTECTOMY    . COLONOSCOPY  05/05/2007   adenoma  . coronary CT angio  03/29/14   Two vessel dz/moderate stenosis of mid LAD and proximal RCA; cath recommended.  Marland Kitchen DILATION AND CURETTAGE OF UTERUS     for vaginal bleeding  . EVENT MONITOR  04/2019   No signif abnormality (Dr. Domenic Polite)  . JOINT REPLACEMENT    . laryngoscopy  02/11/2019   (Done b/c of hoarseness and hx of wheezing"Reinke's edema  of vocal folds, right vocal cord leukoplakia-->plan for bx of leukoplakia->NEEDS TO QUIT SMOKING!  . LEFT HEART CATHETERIZATION WITH CORONARY ANGIOGRAM N/A 03/31/2014   Normal coronaries, EF 23-76%, diastolic dysfunction.  Procedure: LEFT HEART CATHETERIZATION WITH CORONARY ANGIOGRAM;  Surgeon: Sinclair Grooms, MD;  [facility];  Service: Cardiovascular;  Laterality: N/A;  . LOWER EXT VENOUS DOPPLERS Bilateral 02/08/2017   NEG for DVT (bilat)  . NCS/EMG  10/03/2017   S1 radiculopathy bilat  . Overnight oximetry testing  09/2017   qualified for oxygen supplementation during sleep  . PFTs  04/2016   No sign of COPD or asthma; FEV 1 worse after albut.  . rectal mss  age 74   High-grade rectal adenoma removed from rectum   . TRANSTHORACIC ECHOCARDIOGRAM  03/23/14; 10/21/17; 05/12/19   2015 Normal.  2019: mild LVH, EF 65-70%, study technically inadequate to assess diastolic function, valves were fine, wall motion normal. 2021 EF 60-65%-->all normal.    Outpatient Medications Prior to Visit  Medication Sig Dispense Refill  . albuterol (PROVENTIL) (2.5 MG/3ML) 0.083% nebulizer  solution Take 3 mLs (2.5 mg total) by nebulization every 6 (six) hours as needed for wheezing or shortness of breath. 75 mL 6  . albuterol (VENTOLIN HFA) 108 (90 Base) MCG/ACT inhaler INHALE 1 TO 2 PUFFS BY MOUTH EVERY 4 HOURS AS NEEDED (Patient not taking: Reported on 09/07/2019) 16 g 11  . aspirin 81 MG chewable tablet Chew 1 tablet (81 mg total) by mouth daily. (Patient taking differently: Chew 81 mg by mouth every evening. ) 14 tablet 0  . azelastine (ASTELIN) 0.1 % nasal spray Place 2 sprays into both nostrils 2 (two) times daily. 30 mL 6  . busPIRone (BUSPAR) 15 MG tablet Take 1 tablet (15 mg total) by mouth 2 (two) times daily. 180 tablet 0  . clonazePAM (KLONOPIN) 0.5 MG tablet TAKE 1 TABLET BY MOUTH DURING THE DAY AS NEEDED AND 2 AT BEDTIME (Patient not taking: Reported on 09/07/2019) 90 tablet 5  .  cyclobenzaprine (FLEXERIL) 10 MG tablet TAKE ONE TABLET BY MOUTH EVERY 6 HOURS AS NEEDED (SPASM RELATED TO HEADACHES) (Patient not taking: Reported on 09/07/2019) 42 tablet 5  . EQ ALLERGY RELIEF, CETIRIZINE, 10 MG tablet Take 1 tablet by mouth once daily 90 tablet 0  . fluticasone (FLONASE) 50 MCG/ACT nasal spray Place 2 sprays into both nostrils daily as needed for allergies. (Patient not taking: Reported on 09/07/2019)    . lamoTRIgine (LAMICTAL) 100 MG tablet Take 2 tablets at bedtime 180 tablet 0  . losartan (COZAAR) 50 MG tablet Take 1 tablet by mouth once daily (Patient taking differently: Take 50 mg by mouth as needed. ) 90 tablet 0  . meloxicam (MOBIC) 15 MG tablet Take 15 mg by mouth every evening.   1  . metoprolol tartrate (LOPRESSOR) 50 MG tablet Take 1 tablet by mouth twice daily 60 tablet 0  . Naldemedine Tosylate (SYMPROIC) 0.2 MG TABS Take 0.2 mg by mouth at bedtime.     . nitroGLYCERIN (NITROSTAT) 0.4 MG SL tablet DISSOLVE ONE TABLET UNDER THE TONGUE EVERY 5 MINUTES AS NEEDED FOR CHEST PAIN.  DO NOT EXCEED A TOTAL OF 3 DOSES IN 15 MINUTES (Patient not taking: Reported on 09/07/2019) 10 tablet 0  . oxyCODONE-acetaminophen (PERCOCET) 10-325 MG tablet Take 1 tablet by mouth in the morning, at noon, and at bedtime.  (Patient not taking: Reported on 09/07/2019)    . pantoprazole (PROTONIX) 40 MG tablet Take 1 tablet by mouth once daily 90 tablet 0  . Potassium (POTASSIMIN PO) Take 2 tablets by mouth at bedtime.    . pregabalin (LYRICA) 200 MG capsule Take 200 mg by mouth daily.     . promethazine (PHENERGAN) 25 MG tablet TAKE 1 TABLET BY MOUTH EVERY 6 HOURS AS NEEDED FOR NAUSEA (Patient not taking: Reported on 09/07/2019) 30 tablet 0  . rOPINIRole (REQUIP) 0.25 MG tablet TAKE 3 TABLETS BY MOUTH AT BEDTIME 270 tablet 0  . rosuvastatin (CRESTOR) 20 MG tablet Take 1 tablet by mouth once daily 30 tablet 0  . sertraline (ZOLOFT) 50 MG tablet Take 1 tablet by mouth once daily 90 tablet 0  .  topiramate (TOPAMAX) 50 MG tablet Take 50 mg by mouth as needed.  (Patient not taking: Reported on 09/07/2019)    . traZODone (DESYREL) 150 MG tablet Take 1 tablet (150 mg total) by mouth at bedtime. 30 tablet 0   No facility-administered medications prior to visit.    Allergies  Allergen Reactions  . Contrast Media [Iodinated Diagnostic Agents] Anaphylaxis    Needs  13-hour prep  . Shellfish Allergy Anaphylaxis  . Codeine Nausea And Vomiting and Other (See Comments)    Hallucinations, Also sees people that are not there   . Furosemide Rash    Rash, soB.  Marland Kitchen Anoro Ellipta [Umeclidinium-Vilanterol] Other (See Comments)    Pt states it caused blisters.  . Flovent Diskus [Fluticasone Propionate (Inhal)]     headache  . Ivermectin Nausea And Vomiting    N/V and rash  . Other   . Spiriva Handihaler [Tiotropium Bromide Monohydrate] Other (See Comments)    Severe headaches  . Symbicort [Budesonide-Formoterol Fumarate]     headache  . Betadine [Povidone Iodine] Rash  . Latex Hives  . Lipitor [Atorvastatin] Itching  . Permethrin Itching  . Stiolto Respimat [Tiotropium Bromide-Olodaterol] Other (See Comments)    Mouth ulcers  . Tape Dermatitis    Paper tape and clear  . Trelegy Ellipta [Fluticasone-Umeclidin-Vilant] Other (See Comments)    headaches    ROS As per HPI  PE: There were no vitals taken for this visit. ***  LABS:   Lab Results  Component Value Date   CKTOTAL 34 08/13/2019    Lab Results  Component Value Date   TSH 1.120 08/13/2019   Lab Results  Component Value Date   WBC 8.8 08/31/2019   HGB 15.7 (H) 08/31/2019   HCT 47.8 (H) 08/31/2019   MCV 92.3 08/31/2019   PLT 261 08/31/2019   Lab Results  Component Value Date   IRON 42 06/22/2014   FERRITIN 22.0 08/24/2015   Lab Results  Component Value Date   VITAMINB12 306 08/13/2019    Lab Results  Component Value Date   CREATININE 0.58 08/31/2019   BUN 11 08/31/2019   NA 140 08/31/2019   K 3.9  08/31/2019   CL 101 08/31/2019   CO2 28 08/31/2019   Lab Results  Component Value Date   ALT 10 08/31/2019   AST 10 (L) 08/31/2019   ALKPHOS 69 08/31/2019   BILITOT 0.5 08/31/2019   Lab Results  Component Value Date   CHOL 186 05/27/2019   Lab Results  Component Value Date   HDL 33.80 (L) 05/27/2019   Lab Results  Component Value Date   LDLCALC 144 (H) 02/06/2019   Lab Results  Component Value Date   TRIG 264.0 (H) 05/27/2019   Lab Results  Component Value Date   CHOLHDL 6 05/27/2019   Lab Results  Component Value Date   HGBA1C 5.8 09/24/2018   IMPRESSION AND PLAN:  No problem-specific Assessment & Plan notes found for this encounter.   An After Visit Summary was printed and given to the patient.  FOLLOW UP: No follow-ups on file.  Signed:  Crissie Sickles, MD           09/11/2019

## 2019-09-16 ENCOUNTER — Encounter (HOSPITAL_COMMUNITY): Payer: Self-pay | Admitting: Licensed Clinical Social Worker

## 2019-09-16 ENCOUNTER — Other Ambulatory Visit: Payer: Self-pay

## 2019-09-16 ENCOUNTER — Ambulatory Visit (INDEPENDENT_AMBULATORY_CARE_PROVIDER_SITE_OTHER): Payer: Commercial Managed Care - PPO | Admitting: Licensed Clinical Social Worker

## 2019-09-16 DIAGNOSIS — F418 Other specified anxiety disorders: Secondary | ICD-10-CM

## 2019-09-16 DIAGNOSIS — F4310 Post-traumatic stress disorder, unspecified: Secondary | ICD-10-CM
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
## 2019-09-23 ENCOUNTER — Encounter (HOSPITAL_COMMUNITY): Payer: Self-pay | Admitting: Licensed Clinical Social Worker

## 2019-09-23 ENCOUNTER — Other Ambulatory Visit: Payer: Self-pay

## 2019-09-23 ENCOUNTER — Ambulatory Visit (INDEPENDENT_AMBULATORY_CARE_PROVIDER_SITE_OTHER): Payer: Commercial Managed Care - PPO | Admitting: Licensed Clinical Social Worker

## 2019-09-23 DIAGNOSIS — F4310 Post-traumatic stress disorder, unspecified: Secondary | ICD-10-CM

## 2019-09-23 DIAGNOSIS — F418 Other specified anxiety disorders: Secondary | ICD-10-CM | POA: Diagnosis not present

## 2019-09-23 NOTE — Progress Notes (Signed)
Virtual Visit via Video Note  I connected with Martha Espinoza on 09/23/19 at  2:30 PM EDT by a video enabled telemedicine application and verified that I am speaking with the correct person using two identifiers.  Location: Patient: home Provider: office   I discussed the limitations of evaluation and management by telemedicine and the availability of in person appointments. The patient expressed understanding and agreed to proceed.   Type of Therapy: Individual Therapy   Treatment Goals addressed: "PTSD, depression and anxiety. I would like to feel more like myself"   Interventions:  CBT, Mindfulness   Summary: Martha Espinoza is a 54 y.o. female who presents with PTSD and Depression with Anxiety.   Suicidal/Homicidal: No without intent/plan   Therapist Response: Martha Espinoza met with clinician for an individual session. Martha Espinoza shared about her psychiatric symptoms, her current life events and her homework. Martha Espinoza reports that her anxiety has been really bad lately and she has been having more panic attacks. Clinician utilized CBT to explore triggers, thoughts, feelings, and behaviors. Clinician processed triggers of feeling overwhelmed and scared at times, possibly paranoid. Clinician discussed mindfulness and provided psychoeducation about gratitude as the opposite of anxiety. Clinician led Martha Espinoza through a mindfulness meditation about gratitude and encouraged her to feel the physical feeling of gratitude as an alternative to anxiety.    Plan: Return again in 1-2 weeks.   Diagnosis: Axis I: PTSD and Depression with Anxiety.  I discussed the assessment and treatment plan with the patient. The patient was provided an opportunity to ask questions and all were answered. The patient agreed with the plan and demonstrated an understanding of the instructions.   The patient was advised to call back or seek an in-person evaluation if the symptoms worsen or if the condition fails to improve as  anticipated.  I provided 50 minutes of non-face-to-face time during this encounter.   [name], LCSW

## 2019-09-24 ENCOUNTER — Telehealth (HOSPITAL_COMMUNITY): Payer: Commercial Managed Care - PPO | Admitting: Psychiatry

## 2019-09-30 ENCOUNTER — Encounter (HOSPITAL_COMMUNITY): Payer: Self-pay | Admitting: Licensed Clinical Social Worker

## 2019-09-30 ENCOUNTER — Ambulatory Visit (INDEPENDENT_AMBULATORY_CARE_PROVIDER_SITE_OTHER): Payer: Commercial Managed Care - PPO | Admitting: Licensed Clinical Social Worker

## 2019-09-30 ENCOUNTER — Other Ambulatory Visit: Payer: Self-pay

## 2019-09-30 DIAGNOSIS — F418 Other specified anxiety disorders: Secondary | ICD-10-CM

## 2019-09-30 DIAGNOSIS — F4310 Post-traumatic stress disorder, unspecified: Secondary | ICD-10-CM | POA: Diagnosis not present
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
## 2019-10-05 ENCOUNTER — Telehealth: Payer: Self-pay | Admitting: Family Medicine

## 2019-10-05 NOTE — Telephone Encounter (Signed)
Patient was last seen 07/13/19 and advised to f/u in 2 weeks for RCI. Okay to be scheduled as VV?

## 2019-10-05 NOTE — Telephone Encounter (Signed)
Yes ok 

## 2019-10-05 NOTE — Telephone Encounter (Signed)
Pt would like a VV due to the fact she relies on transportation and is in a wheelchair. She was advise dthat we prefer to see pt's in person when possible but that I would ask if she is ok to be scheduled as a VV and we would call her back to advise.

## 2019-10-05 NOTE — Telephone Encounter (Signed)
Patient scheduled for RCI f/u visit 7/23.

## 2019-10-07 ENCOUNTER — Ambulatory Visit (INDEPENDENT_AMBULATORY_CARE_PROVIDER_SITE_OTHER): Payer: Commercial Managed Care - PPO | Admitting: Licensed Clinical Social Worker

## 2019-10-07 ENCOUNTER — Encounter (HOSPITAL_COMMUNITY): Payer: Self-pay | Admitting: Licensed Clinical Social Worker

## 2019-10-07 ENCOUNTER — Other Ambulatory Visit: Payer: Self-pay

## 2019-10-07 DIAGNOSIS — F418 Other specified anxiety disorders: Secondary | ICD-10-CM | POA: Diagnosis not present

## 2019-10-07 DIAGNOSIS — F4310 Post-traumatic stress disorder, unspecified: Secondary | ICD-10-CM

## 2019-10-07 NOTE — Progress Notes (Signed)
Virtual Visit via Video Note  I connected with SHONIA SKILLING on 10/07/19 at  1:30 PM EDT by a video enabled telemedicine application and verified that I am speaking with the correct person using two identifiers.  Location: Patient: home Provider: office   I discussed the limitations of evaluation and management by telemedicine and the availability of in person appointments. The patient expressed understanding and agreed to proceed.  Type of Therapy: Individual Therapy   Treatment Goals addressed: "PTSD, depression and anxiety. I would like to feel more like myself"   Interventions:  CBT, Mindfulness   Summary: Kaeley Vinje is a 54 y.o. female who presents with PTSD and Depression with Anxiety.   Suicidal/Homicidal: No without intent/plan   Therapist Response: Almyra Free met with clinician for an individual session. Shakora shared about her psychiatric symptoms, her current life events and her homework. Veronika reports she has been trying to be a good mother in law, trying to start her relationship with daughter in law with a clean slate. Clinician processed thoughts and feelings about her daughter in law and noted that Toba's attitude is directly correlated to her daughter in [place] attitude. Clinician encouraged Loreli to remain a support for the couple. Clinician discussed health, anxiety, sleep. Clinician processed anxiety about upcoming surgery and provided feedback about ways to relax before going in, making sure she writes down her questions for the doctor, understands what will be done and how long recovery time will be. Clinician also processed ongoing fear that ex boyfriend will be released from prison without them telling her. Clinician processed those thoughts and explored options to ensure she is not alone.    Plan: Return again in 1-2 weeks.   Diagnosis: Axis I: PTSD and Depression with Anxiety.     I discussed the assessment and treatment plan with the patient. The patient was  provided an opportunity to ask questions and all were answered. The patient agreed with the plan and demonstrated an understanding of the instructions.   The patient was advised to call back or seek an in-person evaluation if the symptoms worsen or if the condition fails to improve as anticipated.  I provided 45 minutes of non-face-to-face time during this encounter.   [name], LCSW

## 2019-10-08 ENCOUNTER — Other Ambulatory Visit: Payer: Self-pay | Admitting: Family Medicine

## 2019-10-13 ENCOUNTER — Telehealth (HOSPITAL_COMMUNITY): Payer: Commercial Managed Care - PPO | Admitting: Psychiatry

## 2019-10-14 ENCOUNTER — Ambulatory Visit (HOSPITAL_COMMUNITY): Payer: Commercial Managed Care - PPO | Admitting: Licensed Clinical Social Worker

## 2019-10-16 ENCOUNTER — Encounter: Payer: Self-pay | Admitting: Family Medicine

## 2019-10-16 ENCOUNTER — Other Ambulatory Visit: Payer: Self-pay

## 2019-10-16 ENCOUNTER — Telehealth (INDEPENDENT_AMBULATORY_CARE_PROVIDER_SITE_OTHER): Payer: Commercial Managed Care - PPO | Admitting: Family Medicine

## 2019-10-16 DIAGNOSIS — E782 Mixed hyperlipidemia: Secondary | ICD-10-CM | POA: Diagnosis not present

## 2019-10-16 DIAGNOSIS — R296 Repeated falls: Secondary | ICD-10-CM

## 2019-10-16 DIAGNOSIS — R2681 Unsteadiness on feet: Secondary | ICD-10-CM

## 2019-10-16 DIAGNOSIS — F419 Anxiety disorder, unspecified: Secondary | ICD-10-CM

## 2019-10-16 DIAGNOSIS — R29898 Other symptoms and signs involving the musculoskeletal system: Secondary | ICD-10-CM | POA: Diagnosis not present

## 2019-10-16 MED ORDER — PROMETHAZINE HCL 25 MG PO TABS: ORAL_TABLET | ORAL | 3 refills | Status: AC

## 2019-10-16 MED ORDER — PANTOPRAZOLE SODIUM 40 MG PO TBEC: 40.0000 mg | DELAYED_RELEASE_TABLET | Freq: Every day | ORAL | 1 refills | Status: AC

## 2019-10-16 NOTE — Progress Notes (Signed)
Virtual Visit via Video Note  I connected with Martha Espinoza on 10/16/19 at  2:30 PM EDT by a video enabled telemedicine application and verified that I am speaking with the correct person using two identifiers.  Location patient: home Location provider:work or home office Persons participating in the virtual visit: patient, provider  I discussed the limitations of evaluation and management by telemedicine and the availability of in person appointments. The patient expressed understanding and agreed to proceed.   HPI: 54 y/o WF being seen today for 3 mo f/u chronic illnesses. A/P as of last visit: "1) Altered mental status + recurrent falls; unclear etiology. Seems to have cleared since tx with cefdinir and stopping several of her meds. Definite psychosomatic component to things at this time. No new/additional w/u needed at this time. Keep with plan for MRI brain and C spine set for 08/10/19 (via her neurology provider). Instructions: Martha Espinoza your cefdinir (antibiotic).   Stay off of losartan unless you blood pressure goes to 140/90 or higher. Restart flonase daily and zyrtec daily. Each day you can restart a single medication in the following order: Ropinirole (restless legs med). Buspirone (anxiety med) Trazodone (for sleep) Cyclobenzaprine (muscle relaxer) Rosuvastatin (cholesterol med). 2) Allergic rhinitis, mild RAD. No sign of need for prednisone. Restart zyrtec and flonase. 3) Hx of high grade rectal adenoma. Due for colonoscopy with Dr. [name] and she is working on getting this arranged. 4) Chronic pain syndrome: mgmt as per [facility] neurology in [place]. 5) Bipolar d/o, chronic anxiety, PTSD: ongoing psychiatry and counseling mgmt.  INTERIM HX: Still weak, says he legs won't carry her well anymore. Last 3-4 days worse, fell yesterday and today. No pain in knees, no hip or ankle pains.   Has chronic pain in neck and low back. Lots of LB imaging, essentially all unrevealing of  any pathology that correlates with her pain. Neurologist (Dr. [name] opinion) did imaging recently. MRI brain w/out contrast 08/10/19 was normal. MRI c-spine w/out contrast: some uncomplicated cervical DDD/spondylosis most prominent at C5-6.  Otherwise normal.  Emberley is understandably very upset that many doctors have worked on figuring out why she's so weak in legs and falling regularly, yet she has no answers.    Her anxiety is at baseline (which is quite severe), son Martha Espinoza moved out and this is actually good. Her psych meds are managed by psychiatry except her clonaz, which I manage. PMP AWARE reviewed today: most recent rx for clonazepam 0.5mg  was filled 09/07/19, # 60, rx by me. No red flags.   Of note, she is due to undergo removal of the upper pole left labial lesion which has grown of late and was in the process of being worked up for removal in the past.  ROS: no fevers, no CP, no dizziness, no HAs, no rashes, no melena/hematochezia.  No polyuria or polydipsia.  No myalgias or arthralgias (except neck and back--chronic).  NO paresthesias or tremors.  No acute vision or hearing abnormalities. No n/v/d or abd pain.  No palpitations.     Past Medical History:  Diagnosis Date  . Abnormal CT of thoracic spine 09/12/2018   Noncontrast.  Increased in number and TINY increase in size of tiny, patchy sclerotic T spine lesions compared to 2018 CT.  Hem/onc summer 2020->w/u->suspected benign bone islands->survellance imaging in 6-12 mo planned.  . Allergic rhinitis   . Arthritis   . Asthma   . Bipolar 1 disorder ([place])   . Chronic pain syndrome    chronic low  back pain (Pain mgmt= Dr. [name]).  MRI showed no signif disc dz, only showed some facet arthropathy at L4-5, with joint diastasis at L4-5--plan as of 11/07/16 neurosurg eval is bilat facet injections. (chronic LBP+ fibromyalgia)  . Colitis due to Clostridium difficile 2001  . COPD (chronic obstructive pulmonary disease) ([place])     PFTs 04/2016 showed no sign of copd or asthma, plus she actually had worse FEV1 after taking albuterol.  However, as of 06/2018 she has recent hx of improvement in sx's after taking albuterol.  LONG TIME SMOKER, won't quit.  Intolerant to or cannot afford: pulmicort, QVAR, advair, symbicort, flovent, bevespi, and spiriva, anoro,stiolto, and trelegy. Pulmicort trial per pulm 12/2018  . Depression   . Fibromyalgia syndrome   . GAD (generalized anxiety disorder)   . GERD (gastroesophageal reflux disease)   . Hepatic hemangioma   . Hepatic steatosis 06/2016   Noted incidentally on CT chest  . History of Salmonella gastroenteritis   . Hoarseness    chronic: laryngoscopy showed edema of vocal folds and R focal fold leukoplakia->>NEEDS TO QUIT SMOKING! Rpt laryngoscopy 03/17/19 showed the leukoplakia spot smaller->to be followed with serial exams by ent. 06/15/19->MUCH imroved vocal fold edema and vocal cord leukoplakia: less hoarseness with smoking cessation!  . Hyperlipidemia    a. Noted 02/2014.  Marland Kitchen Hypertension   . IBS (irritable bowel syndrome)   . Iron deficiency anemia 05/2014   Per Martha Espinoza it is not due to vaginal blood loss; hemoccults sent to Martha Espinoza in mail 413/[id number].  . Lumbar spondylosis    Mild, mainly focused at L4-5 and L5-S1.  MRI L spine 09/09/17-->mild degenerative disc disease and facet arthrosis without any nerve root encroachment, no spinal stenosis.  . Microscopic hematuria    Intermittent (no w/u done yet, as of 03/2014)  . Migraine headache   . Morbid obesity ([place])   . Multiple lipomas 09/2016   Left upper arm--very small ones.  . Nocturnal hypoxia 09/18/2017   Overnight oximetry -->qualified: ordered 2L oxygen during sleep.  . Obesity hypoventilation syndrome ([place])   . Pain in both lower legs 2018-2019   Pain and numbness bilat LLs.  NCS/EMGs findings suggestive of S1 radiculopathy bilat: sx's + NCS findings suggestive of neurogenic claudication/spinal stenosis.  ? contribution of venous  reflux?-->"idiopathic peripheral neuropathy, progressive->neurol/pain mgmt  . Peripheral edema Fall 2018   R>L, non-pitting for the most part--venous doppler neg for DVT 01/2017.  Saw Dr. Bridgett Larsson (Vasc) 04/12/17 and u/s showed some venous reflux dz but he felt her sx's in legs were NOT due to her venous insufficiency OR PAD.  Thigh high comp stockings rx'd.  . Polycystic ovarian syndrome   . Pruritic dermatitis 2015/2016   +Scabies prep at [facility] 07/2014. (Primarily pruritic skin, but eventually a subtle rash as well)  . PTSD (post-traumatic stress disorder)   . Raynaud's phenomenon 2019  . Rectal cancer ([place])    a. Followed by Dr. Gala Romney, dx 2000-2001.  Surveillance colonoscopy planned as of 11/2018 GI f/u.  . Right shoulder pain 2017   [place]: RC bursitis + RC tear (MRI)--injection trial is plan per Martha Espinoza report 05/04/16.  . Vitamin D deficiency   . Wheezing    Most of her resp symptoms are c/w upper airway etiology (repeated PFTs ->no obstructive dz). Pulm referred her to ENT 12/2018.    Past Surgical History:  Procedure Laterality Date  . ABI's complete Bilateral 09/30/2017   Normal ABIs.  Great toe pressures adequate for wound healing.  Femoral artery waveforms triphasic (normal).  . CARDIOVASCULAR STRESS TEST  03/24/14   Lexscan MIBI: mild anterior ischemia?  Cardiac CT angiogram recommended/done.  . CHOLECYSTECTOMY    . COLONOSCOPY  05/05/2007   adenoma  . coronary CT angio  03/29/14   Two vessel dz/moderate stenosis of mid LAD and proximal RCA; cath recommended.  Marland Kitchen DILATION AND CURETTAGE OF UTERUS     for vaginal bleeding  . EVENT MONITOR  04/2019   No signif abnormality (Dr. Domenic Polite)  . JOINT REPLACEMENT    . laryngoscopy  02/11/2019   (Done b/c of hoarseness and hx of wheezing"Reinke's edema of vocal folds, right vocal cord leukoplakia-->plan for bx of leukoplakia->NEEDS TO QUIT SMOKING!  . LEFT HEART CATHETERIZATION WITH CORONARY ANGIOGRAM N/A 03/31/2014   Normal  coronaries, EF 19-37%, diastolic dysfunction.  Procedure: LEFT HEART CATHETERIZATION WITH CORONARY ANGIOGRAM;  Surgeon: Sinclair Grooms, MD;  Location: [facility] CATH LAB;  Service: Cardiovascular;  Laterality: N/A;  . LOWER EXT VENOUS DOPPLERS Bilateral 02/08/2017   NEG for DVT (bilat)  . NCS/EMG  10/03/2017   S1 radiculopathy bilat  . Overnight oximetry testing  09/2017   qualified for oxygen supplementation during sleep  . PFTs  04/2016   No sign of COPD or asthma; FEV 1 worse after albut.  . rectal mss  age 42   High-grade rectal adenoma removed from rectum   . TRANSTHORACIC ECHOCARDIOGRAM  03/23/14; 10/21/17; 05/12/19   2015 Normal.  2019: mild LVH, EF 65-70%, study technically inadequate to assess diastolic function, valves were fine, wall motion normal. 2021 EF 60-65%-->all normal.    Family History  Problem Relation Age of Onset  . Colon cancer Father 36  . Cancer Father   . Heart disease Mother   . Hypertension Mother   . Hyperlipidemia Mother   . Mental illness Mother   . Diabetes Mother   . Heart attack Mother   . Thyroid disease Mother   . Anxiety disorder Mother   . Depression Mother   . Colon cancer Maternal Grandfather   . Bipolar disorder Son   . Stroke Neg Hx      Current Outpatient Medications:  .  albuterol (PROVENTIL) (2.5 MG/3ML) 0.083% nebulizer solution, Take 3 mLs (2.5 mg total) by nebulization every 6 (six) hours as needed for wheezing or shortness of breath., Disp: 75 mL, Rfl: 6 .  albuterol (VENTOLIN HFA) 108 (90 Base) MCG/ACT inhaler, INHALE 1 TO 2 PUFFS BY MOUTH EVERY 4 HOURS AS NEEDED, Disp: 16 g, Rfl: 11 .  azelastine (ASTELIN) 0.1 % nasal spray, Place 2 sprays into both nostrils 2 (two) times daily., Disp: 30 mL, Rfl: 6 .  clonazePAM (KLONOPIN) 0.5 MG tablet, TAKE 1 TABLET BY MOUTH DURING THE DAY AS NEEDED AND 2 AT BEDTIME (Patient taking differently: in the morning, at noon, in the evening, and at bedtime. TAKE 1 TABLET BY MOUTH DURING THE DAY AS NEEDED  AND 2 AT BEDTIME), Disp: 90 tablet, Rfl: 5 .  cyclobenzaprine (FLEXERIL) 10 MG tablet, TAKE ONE TABLET BY MOUTH EVERY 6 HOURS AS NEEDED (SPASM RELATED TO HEADACHES), Disp: 42 tablet, Rfl: 5 .  EQ ALLERGY RELIEF, CETIRIZINE, 10 MG tablet, Take 1 tablet by mouth once daily, Disp: 90 tablet, Rfl: 0 .  fluticasone (FLONASE) 50 MCG/ACT nasal spray, Place 2 sprays into both nostrils daily as needed for allergies. , Disp: , Rfl:  .  lamoTRIgine (LAMICTAL) 100 MG tablet, Take 2 tablets at bedtime, Disp: 180 tablet, Rfl: 0 .  meloxicam (MOBIC) 15 MG tablet, Take 15 mg by mouth every evening. , Disp: , Rfl: 1 .  metoprolol tartrate (LOPRESSOR) 50 MG tablet, Take 1 tablet by mouth twice daily, Disp: 60 tablet, Rfl: 0 .  Naldemedine Tosylate (SYMPROIC) 0.2 MG TABS, Take 0.2 mg by mouth at bedtime. , Disp: , Rfl:  .  nitroGLYCERIN (NITROSTAT) 0.4 MG SL tablet, DISSOLVE ONE TABLET UNDER THE TONGUE EVERY 5 MINUTES AS NEEDED FOR CHEST PAIN.  DO NOT EXCEED A TOTAL OF 3 DOSES IN 15 MINUTES, Disp: 10 tablet, Rfl: 0 .  oxyCODONE-acetaminophen (PERCOCET) 10-325 MG tablet, Take 1 tablet by mouth in the morning, at noon, and at bedtime. , Disp: , Rfl:  .  pantoprazole (PROTONIX) 40 MG tablet, Take 1 tablet by mouth once daily, Disp: 90 tablet, Rfl: 0 .  Potassium (POTASSIMIN PO), Take 2 tablets by mouth at bedtime., Disp: , Rfl:  .  pregabalin (LYRICA) 200 MG capsule, Take 200 mg by mouth daily. , Disp: , Rfl:  .  rOPINIRole (REQUIP) 0.25 MG tablet, TAKE 3 TABLETS BY MOUTH AT BEDTIME, Disp: 270 tablet, Rfl: 0 .  rosuvastatin (CRESTOR) 20 MG tablet, Take 1 tablet by mouth once daily, Disp: 30 tablet, Rfl: 0 .  sertraline (ZOLOFT) 50 MG tablet, Take 1 tablet by mouth once daily, Disp: 90 tablet, Rfl: 0 .  aspirin 81 MG chewable tablet, Chew 1 tablet (81 mg total) by mouth daily. (Patient not taking: Reported on 10/16/2019), Disp: 14 tablet, Rfl: 0 .  busPIRone (BUSPAR) 15 MG tablet, Take 1 tablet (15 mg total) by mouth 2  (two) times daily. (Patient not taking: Reported on 10/16/2019), Disp: 180 tablet, Rfl: 0 .  losartan (COZAAR) 50 MG tablet, Take 1 tablet by mouth once daily (Patient not taking: Reported on 10/16/2019), Disp: 90 tablet, Rfl: 0 .  promethazine (PHENERGAN) 25 MG tablet, TAKE 1 TABLET BY MOUTH EVERY 6 HOURS AS NEEDED FOR NAUSEA (Patient not taking: Reported on 09/07/2019), Disp: 30 tablet, Rfl: 0 .  topiramate (TOPAMAX) 50 MG tablet, Take 50 mg by mouth as needed.  (Patient not taking: Reported on 09/07/2019), Disp: , Rfl:  .  traZODone (DESYREL) 150 MG tablet, Take 1 tablet (150 mg total) by mouth at bedtime. (Patient not taking: Reported on 10/16/2019), Disp: 30 tablet, Rfl: 0  EXAM:  VITALS per patient if applicable:  Vitals with BMI 08/13/2019 07/13/2019 07/09/2019  Height 5\' 0"  5\' 0"  5\' 0"   Weight 249 lbs 241 lbs (No Data)  BMI 93.23 55.73 -  Systolic 220 254 95  Diastolic 82 74 62  Pulse 83 100 81  Some encounter information is confidential and restricted. Go to Review Flowsheets activity to see all data.    GENERAL: alert, oriented, appears well and in no acute distress  HEENT: atraumatic, conjunttiva clear, no obvious abnormalities on inspection of external nose and ears  NECK: normal movements of the head and neck  LUNGS: on inspection no signs of respiratory distress, breathing rate appears normal, no obvious gross SOB, gasping or wheezing  CV: no obvious cyanosis  MS: moves all visible extremities without noticeable abnormality  PSYCH/NEURO: pleasant and cooperative, no obvious depression or anxiety, speech and thought processing grossly intact  LABS: none today    Chemistry      Component Value Date/Time   NA 140 08/31/2019 1053   K 3.9 08/31/2019 1053   CL 101 08/31/2019 1053   CO2 28 08/31/2019 1053   BUN 11 08/31/2019 1053  CREATININE 0.58 08/31/2019 1053      Component Value Date/Time   CALCIUM 9.3 08/31/2019 1053   ALKPHOS 69 08/31/2019 1053   AST 10 (L)  08/31/2019 1053   ALT 10 08/31/2019 1053   BILITOT 0.5 08/31/2019 1053     Lab Results  Component Value Date   WBC 8.8 08/31/2019   HGB 15.7 (H) 08/31/2019   HCT 47.8 (H) 08/31/2019   MCV 92.3 08/31/2019   PLT 261 08/31/2019    Lab Results  Component Value Date   IRON 42 06/22/2014   FERRITIN 22.0 08/24/2015    Lab Results  Component Value Date   CHOL 186 05/27/2019   HDL 33.80 (L) 05/27/2019   LDLCALC 144 (H) 02/06/2019   LDLDIRECT 116.0 05/27/2019   TRIG 264.0 (H) 05/27/2019   CHOLHDL 6 05/27/2019   Lab Results  Component Value Date   TSH 1.120 08/13/2019   Lab Results  Component Value Date   HGBA1C 5.8 09/24/2018    ASSESSMENT AND PLAN:  Discussed the following assessment and plan:  1) Chronic, progressive bilat lower extremity weakness, unsteady gait, recurrent falls. Two neurologists have done workups and they have been unrevealing. I will see if can get her in [facility] for evaluation  2) Chronic severe anxiety (+PTSD, bipolar d/o). Managed by psych except for her clonaz, which I rx. No changes today.CSC 09/24/18-->CSC and UDS need renewing at next in person visit.   3) Hypercholesterolemia: tolerating crestor 20mg  qd. FLP and hepatic panel ordered--future.  4) HTN: Martha Espinoza has not had to restart her losartan 50mg  since we took her off of it when she had MS changes 3 mo ago.  She IS back on lopressor 50mg  bid, though.  Continue periodic home bp checks.  -we discussed possible serious and likely etiologies, options for evaluation and workup, limitations of telemedicine visit vs in person visit, treatment, treatment risks and precautions. Martha Espinoza prefers to treat via telemedicine empirically rather then risking or undertaking an in person visit at this moment. Patient agrees to seek prompt in person care if worsening, new symptoms arise, or if is not improving with treatment.   I discussed the assessment and treatment plan with the patient. The patient was  provided an opportunity to ask questions and all were answered. The patient agreed with the plan and demonstrated an understanding of the instructions.   The patient was advised to call back or seek an in-person evaluation if the symptoms worsen or if the condition fails to improve as anticipated.  F/u: 3 mo  Signed:  Crissie Sickles, MD           10/16/2019

## 2019-10-19 ENCOUNTER — Telehealth (INDEPENDENT_AMBULATORY_CARE_PROVIDER_SITE_OTHER): Payer: Commercial Managed Care - PPO | Admitting: Psychiatry

## 2019-10-19 ENCOUNTER — Encounter (HOSPITAL_COMMUNITY): Payer: Self-pay | Admitting: Psychiatry

## 2019-10-19 ENCOUNTER — Other Ambulatory Visit: Payer: Self-pay

## 2019-10-19 DIAGNOSIS — F4310 Post-traumatic stress disorder, unspecified: Secondary | ICD-10-CM | POA: Diagnosis not present

## 2019-10-19 DIAGNOSIS — F418 Other specified anxiety disorders: Secondary | ICD-10-CM

## 2019-10-19 MED ORDER — LAMOTRIGINE 100 MG PO TABS: ORAL_TABLET | ORAL | 0 refills | Status: DC

## 2019-10-19 MED ORDER — TRAZODONE HCL 150 MG PO TABS: 150.0000 mg | ORAL_TABLET | Freq: Every day | ORAL | 0 refills | Status: DC

## 2019-10-19 MED ORDER — SERTRALINE HCL 100 MG PO TABS: 100.0000 mg | ORAL_TABLET | Freq: Every day | ORAL | 0 refills | Status: DC

## 2019-10-19 MED ORDER — BUSPIRONE HCL 15 MG PO TABS: 15.0000 mg | ORAL_TABLET | Freq: Two times a day (BID) | ORAL | 0 refills | Status: AC

## 2019-10-19 NOTE — Progress Notes (Signed)
[facility] OP Progress Note   Virtual Visit via Video Note  I connected with Martha Espinoza on 10/19/19 at  4:00 PM EDT by a video enabled telemedicine application and verified that I am speaking with the correct person using two identifiers.  Location: Patient: Home Provider: Clinic   I discussed the limitations of evaluation and management by telemedicine and the availability of in person appointments. The patient expressed understanding and agreed to proceed.  The session started off as a video encounter (like last visit) however had to be converted to phone encounter due to poor Internet connection.  I provided 16 minutes of non-face-to-face time during this encounter.   10/19/2019 4:10 PM Martha Espinoza  MRN:  [id number]  Chief Complaint: "I am doing okay."   HPI: Patient reported that she is doing okay. She informed that had a procedure done a few days ago and she saw the OB/GYN for follow-up today.  She stated that she is relieved to know that the lesion is benign and she does not have to worry about anything.  She stated that she is very happy that she can now take baths while sitting and does not have to take showers. She stated that she still has anxiety most of the days.  She informed that her son is doing better and that he recently got married to his girlfriend who he has been with for 5 years.  She is happy for her son and she is hoping that her son will continue to the right things.  She stated that lately he has been acting very responsible.  She happily spoke about her granddaughter who was with her during the past weekend.  Patient was agreeable to increasing the dose of Zoloft to 100 mg for optimal effect.   Visit Diagnosis:    ICD-10-CM   1. PTSD (post-traumatic stress disorder)  F43.10   2. Depression with anxiety  F41.8     Past Psychiatric History: Depression, anxiety, PTSD  Past Medical History:  Past Medical History:  Diagnosis Date  . Abnormal CT of  thoracic spine 09/12/2018   Noncontrast.  Increased in number and TINY increase in size of tiny, patchy sclerotic T spine lesions compared to 2018 CT.  Hem/onc summer 2020->w/u->suspected benign bone islands->survellance imaging in 6-12 mo planned.  . Allergic rhinitis   . Arthritis   . Asthma   . Bipolar 1 disorder ([place])   . Chronic pain syndrome    chronic low back pain (Pain mgmt= Dr. [name]).  MRI showed no signif disc dz, only showed some facet arthropathy at L4-5, with joint diastasis at L4-5--plan as of 11/07/16 neurosurg eval is bilat facet injections. (chronic LBP+ fibromyalgia)  . Colitis due to Clostridium difficile 2001  . COPD (chronic obstructive pulmonary disease) ([place])    PFTs 04/2016 showed no sign of copd or asthma, plus she actually had worse FEV1 after taking albuterol.  However, as of 06/2018 she has recent hx of improvement in sx's after taking albuterol.  LONG TIME SMOKER, won't quit.  Intolerant to or cannot afford: pulmicort, QVAR, advair, symbicort, flovent, bevespi, and spiriva, anoro,stiolto, and trelegy. Pulmicort trial per pulm 12/2018  . Depression   . Fibromyalgia syndrome   . GAD (generalized anxiety disorder)   . GERD (gastroesophageal reflux disease)   . Hepatic hemangioma   . Hepatic steatosis 06/2016   Noted incidentally on CT chest  . History of Salmonella gastroenteritis   . Hoarseness    chronic: laryngoscopy  showed edema of vocal folds and R focal fold leukoplakia->>NEEDS TO QUIT SMOKING! Rpt laryngoscopy 03/17/19 showed the leukoplakia spot smaller->to be followed with serial exams by ent. 06/15/19->MUCH imroved vocal fold edema and vocal cord leukoplakia: less hoarseness with smoking cessation!  . Hyperlipidemia    a. Noted 02/2014.  Marland Kitchen Hypertension   . IBS (irritable bowel syndrome)   . Iron deficiency anemia 05/2014   Per pt it is not due to vaginal blood loss; hemoccults sent to pt in mail 413/16.  . Lumbar spondylosis    Mild, mainly focused at  L4-5 and L5-S1.  MRI L spine 09/09/17-->mild degenerative disc disease and facet arthrosis without any nerve root encroachment, no spinal stenosis.  . Microscopic hematuria    Intermittent (no w/u done yet, as of 03/2014)  . Migraine headache   . Morbid obesity ([place])   . Multiple lipomas 09/2016   Left upper arm--very small ones.  . Nocturnal hypoxia 09/18/2017   Overnight oximetry -->qualified: ordered 2L oxygen during sleep.  . Obesity hypoventilation syndrome ([place])   . Pain in both lower legs 2018-2019   Pain and numbness bilat LLs.  NCS/EMGs findings suggestive of S1 radiculopathy bilat: sx's + NCS findings suggestive of neurogenic claudication/spinal stenosis.  ? contribution of venous reflux?-->"idiopathic peripheral neuropathy, progressive->neurol/pain mgmt  . Peripheral edema Fall 2018   R>L, non-pitting for the most part--venous doppler neg for DVT 01/2017.  Saw Dr. Bridgett Larsson (Vasc) 04/12/17 and u/s showed some venous reflux dz but he felt her sx's in legs were NOT due to her venous insufficiency OR PAD.  Thigh high comp stockings rx'd.  . Polycystic ovarian syndrome   . Pruritic dermatitis 2015/2016   +Scabies prep at [facility] 07/2014. (Primarily pruritic skin, but eventually a subtle rash as well)  . PTSD (post-traumatic stress disorder)   . Raynaud's phenomenon 2019  . Rectal cancer ([place])    a. Followed by Dr. Gala Romney, dx 2000-2001.  Surveillance colonoscopy planned as of 11/2018 GI f/u.  . Right shoulder pain 2017   Murphy/Wainer: RC bursitis + RC tear (MRI)--injection trial is plan per pt report 05/04/16.  . Vitamin D deficiency   . Wheezing    Most of her resp symptoms are c/w upper airway etiology (repeated PFTs ->no obstructive dz). Pulm referred her to ENT 12/2018.    Past Surgical History:  Procedure Laterality Date  . ABI's complete Bilateral 09/30/2017   Normal ABIs.  Great toe pressures adequate for wound healing.  Femoral artery waveforms triphasic (normal).  .  CARDIOVASCULAR STRESS TEST  03/24/14   Lexscan MIBI: mild anterior ischemia?  Cardiac CT angiogram recommended/done.  . CHOLECYSTECTOMY    . COLONOSCOPY  05/05/2007   adenoma  . coronary CT angio  03/29/14   Two vessel dz/moderate stenosis of mid LAD and proximal RCA; cath recommended.  Marland Kitchen DILATION AND CURETTAGE OF UTERUS     for vaginal bleeding  . EVENT MONITOR  04/2019   No signif abnormality (Dr. Domenic Polite)  . JOINT REPLACEMENT    . laryngoscopy  02/11/2019   (Done b/c of hoarseness and hx of wheezing"Reinke's edema of vocal folds, right vocal cord leukoplakia-->plan for bx of leukoplakia->NEEDS TO QUIT SMOKING!  . LEFT HEART CATHETERIZATION WITH CORONARY ANGIOGRAM N/A 03/31/2014   Normal coronaries, EF 15-40%, diastolic dysfunction.  Procedure: LEFT HEART CATHETERIZATION WITH CORONARY ANGIOGRAM;  Surgeon: Sinclair Grooms, MD;  Location: [facility] CATH LAB;  Service: Cardiovascular;  Laterality: N/A;  . LOWER EXT VENOUS DOPPLERS Bilateral  02/08/2017   NEG for DVT (bilat)  . NCS/EMG  10/03/2017   S1 radiculopathy bilat  . Overnight oximetry testing  09/2017   qualified for oxygen supplementation during sleep  . PFTs  04/2016   No sign of COPD or asthma; FEV 1 worse after albut.  . rectal mss  age 41   High-grade rectal adenoma removed from rectum   . TRANSTHORACIC ECHOCARDIOGRAM  03/23/14; 10/21/17; 05/12/19   2015 Normal.  2019: mild LVH, EF 65-70%, study technically inadequate to assess diastolic function, valves were fine, wall motion normal. 2021 EF 60-65%-->all normal.    Family Psychiatric History: See below  Family History:  Family History  Problem Relation Age of Onset  . Colon cancer Father 87  . Cancer Father   . Heart disease Mother   . Hypertension Mother   . Hyperlipidemia Mother   . Mental illness Mother   . Diabetes Mother   . Heart attack Mother   . Thyroid disease Mother   . Anxiety disorder Mother   . Depression Mother   . Colon cancer Maternal Grandfather   .  Bipolar disorder Son   . Stroke Neg Hx     Social History:  Social History   Socioeconomic History  . Marital status: Married    Spouse name: tracey  . Number of children: 1  . Years of education: Not on file  . Highest education level: 9th grade  Occupational History  . Occupation: own Games developer: [employer]  Tobacco Use  . Smoking status: Former Smoker    Packs/day: 1.00    Years: 18.00    Pack years: 18.00    Types: Cigarettes  . Smokeless tobacco: Never Used  . Tobacco comment: 25 years  Vaping Use  . Vaping Use: Never used  Substance and Sexual Activity  . Alcohol use: No    Alcohol/week: 0.0 standard drinks  . Drug use: No  . Sexual activity: Not Currently    Birth control/protection: None, Surgical  Other Topics Concern  . Not on file  Social History Narrative   A friend   Social Determinants of Health   Financial Resource Strain:   . Difficulty of Paying Living Expenses:   Food Insecurity:   . Worried About Charity fundraiser in the Last Year:   . Arboriculturist in the Last Year:   Transportation Needs:   . Film/video editor (Medical):   Marland Kitchen Lack of Transportation (Non-Medical):   Physical Activity:   . Days of Exercise per Week:   . Minutes of Exercise per Session:   Stress:   . Feeling of Stress :   Social Connections:   . Frequency of Communication with Friends and Family:   . Frequency of Social Gatherings with Friends and Family:   . Attends Religious Services:   . Active Member of Clubs or Organizations:   . Attends Archivist Meetings:   Marland Kitchen Marital Status:     Allergies:  Allergies  Allergen Reactions  . Contrast Media [Iodinated Diagnostic Agents] Anaphylaxis    Needs 13-hour prep  . Shellfish Allergy Anaphylaxis  . Codeine Nausea And Vomiting and Other (See Comments)    Hallucinations, Also sees people that are not there   . Furosemide Rash    Rash, soB.  Marland Kitchen Anoro Ellipta  [Umeclidinium-Vilanterol] Other (See Comments)    Pt states it caused blisters.  . Flovent Diskus [Fluticasone Propionate (Inhal)]  headache  . Ivermectin Nausea And Vomiting    N/V and rash  . Other   . Spiriva Handihaler [Tiotropium Bromide Monohydrate] Other (See Comments)    Severe headaches  . Symbicort [Budesonide-Formoterol Fumarate]     headache  . Betadine [Povidone Iodine] Rash  . Latex Hives  . Lipitor [Atorvastatin] Itching  . Permethrin Itching  . Stiolto Respimat [Tiotropium Bromide-Olodaterol] Other (See Comments)    Mouth ulcers  . Tape Dermatitis    Paper tape and clear  . Trelegy Ellipta [Fluticasone-Umeclidin-Vilant] Other (See Comments)    headaches    Metabolic Disorder Labs: Lab Results  Component Value Date   HGBA1C 5.8 09/24/2018   MPG 123 (H) 03/23/2014   No results found for: PROLACTIN Lab Results  Component Value Date   CHOL 186 05/27/2019   TRIG 264.0 (H) 05/27/2019   HDL 33.80 (L) 05/27/2019   CHOLHDL 6 05/27/2019   VLDL 52.8 (H) 05/27/2019   LDLCALC 144 (H) 02/06/2019   LDLCALC 169 (H) 03/24/2014   Lab Results  Component Value Date   TSH 1.120 08/13/2019   TSH 1.12 10/08/2016    Therapeutic Level Labs: No results found for: LITHIUM No results found for: VALPROATE No components found for:  CBMZ  Current Medications: Current Outpatient Medications  Medication Sig Dispense Refill  . albuterol (PROVENTIL) (2.5 MG/3ML) 0.083% nebulizer solution Take 3 mLs (2.5 mg total) by nebulization every 6 (six) hours as needed for wheezing or shortness of breath. 75 mL 6  . albuterol (VENTOLIN HFA) 108 (90 Base) MCG/ACT inhaler INHALE 1 TO 2 PUFFS BY MOUTH EVERY 4 HOURS AS NEEDED 16 g 11  . aspirin 81 MG chewable tablet Chew 1 tablet (81 mg total) by mouth daily. (Patient not taking: Reported on 10/16/2019) 14 tablet 0  . azelastine (ASTELIN) 0.1 % nasal spray Place 2 sprays into both nostrils 2 (two) times daily. 30 mL 6  . busPIRone (BUSPAR)  15 MG tablet Take 1 tablet (15 mg total) by mouth 2 (two) times daily. (Patient not taking: Reported on 10/16/2019) 180 tablet 0  . clonazePAM (KLONOPIN) 0.5 MG tablet TAKE 1 TABLET BY MOUTH DURING THE DAY AS NEEDED AND 2 AT BEDTIME (Patient taking differently: in the morning, at noon, in the evening, and at bedtime. TAKE 1 TABLET BY MOUTH DURING THE DAY AS NEEDED AND 2 AT BEDTIME) 90 tablet 5  . cyclobenzaprine (FLEXERIL) 10 MG tablet TAKE ONE TABLET BY MOUTH EVERY 6 HOURS AS NEEDED (SPASM RELATED TO HEADACHES) 42 tablet 5  . EQ ALLERGY RELIEF, CETIRIZINE, 10 MG tablet Take 1 tablet by mouth once daily 90 tablet 0  . fluticasone (FLONASE) 50 MCG/ACT nasal spray Place 2 sprays into both nostrils daily as needed for allergies.     Marland Kitchen lamoTRIgine (LAMICTAL) 100 MG tablet Take 2 tablets at bedtime 180 tablet 0  . losartan (COZAAR) 50 MG tablet Take 1 tablet by mouth once daily (Patient not taking: Reported on 10/16/2019) 90 tablet 0  . meloxicam (MOBIC) 15 MG tablet Take 15 mg by mouth every evening.   1  . metoprolol tartrate (LOPRESSOR) 50 MG tablet Take 1 tablet by mouth twice daily 60 tablet 0  . Naldemedine Tosylate (SYMPROIC) 0.2 MG TABS Take 0.2 mg by mouth at bedtime.     . nitroGLYCERIN (NITROSTAT) 0.4 MG SL tablet DISSOLVE ONE TABLET UNDER THE TONGUE EVERY 5 MINUTES AS NEEDED FOR CHEST PAIN.  DO NOT EXCEED A TOTAL OF 3 DOSES IN 15 MINUTES 10  tablet 0  . oxyCODONE-acetaminophen (PERCOCET) 10-325 MG tablet Take 1 tablet by mouth in the morning, at noon, and at bedtime.     . pantoprazole (PROTONIX) 40 MG tablet Take 1 tablet (40 mg total) by mouth daily. 180 tablet 1  . Potassium (POTASSIMIN PO) Take 2 tablets by mouth at bedtime.    . pregabalin (LYRICA) 200 MG capsule Take 200 mg by mouth daily.     . promethazine (PHENERGAN) 25 MG tablet TAKE 1 TABLET BY MOUTH EVERY 6 HOURS AS NEEDED FOR NAUSEA 30 tablet 3  . rOPINIRole (REQUIP) 0.25 MG tablet TAKE 3 TABLETS BY MOUTH AT BEDTIME 270 tablet 0  .  rosuvastatin (CRESTOR) 20 MG tablet Take 1 tablet by mouth once daily 30 tablet 0  . sertraline (ZOLOFT) 50 MG tablet Take 1 tablet by mouth once daily 90 tablet 0  . topiramate (TOPAMAX) 50 MG tablet Take 50 mg by mouth as needed.  (Patient not taking: Reported on 09/07/2019)    . traZODone (DESYREL) 150 MG tablet Take 1 tablet (150 mg total) by mouth at bedtime. (Patient not taking: Reported on 10/16/2019) 30 tablet 0   No current facility-administered medications for this visit.     Psychiatric Specialty Exam: Review of Systems  There were no vitals taken for this visit.There is no height or weight on file to calculate BMI.  General Appearance: fairly groomed  Eye Contact:  good  Speech:  Clear and Coherent and Normal Rate  Volume:  Normal  Mood:  Anxious  Affect:  Congruent  Thought Process:  Goal Directed, Linear and Descriptions of Associations: Intact  Orientation:  Full (Time, Place, and Person)  Thought Content: Logical   Suicidal Thoughts:  No  Homicidal Thoughts:  No  Memory:  Immediate;   Good Recent;   Fair Remote;   Fair  Judgement:  Fair  Insight:  Fair  Psychomotor Activity:  Normal  Concentration:  Concentration: Good and Attention Span: Good  Recall:  Good  Fund of Knowledge: Fair  Language: Good  Akathisia:  Negative  Handed:  Right  AIMS (if indicated): not done   Assets:  Communication Skills Desire for Improvement Financial Resources/Insurance Housing Social Support  ADL's:  Intact  Cognition: WNL  Sleep:  Fair   Screenings: PHQ2-9     Office Visit from 08/16/2017 in [facility] Visit from 07/17/2017 in [facility]  PHQ-2 Total Score 2 6  PHQ-9 Total Score 18 18       Assessment and Plan: Patient continues to report anxiety and most of the days.  She is agreeable to going up on the dose of Zoloft to 100 milligrams for optimal effect.   1. PTSD (post-traumatic stress disorder)  - busPIRone  (BUSPAR) 15 MG tablet; Take 1 tablet (15 mg total) by mouth 2 (two) times daily.  Dispense: 180 tablet; Refill: 0 - sertraline (ZOLOFT) 100 MG tablet; Take 1 tablet (100 mg total) by mouth daily.  Dispense: 90 tablet; Refill: 0  2. Depression with anxiety  - busPIRone (BUSPAR) 15 MG tablet; Take 1 tablet (15 mg total) by mouth 2 (two) times daily.  Dispense: 180 tablet; Refill: 0 - lamoTRIgine (LAMICTAL) 100 MG tablet; Take 2 tablets at bedtime  Dispense: 180 tablet; Refill: 0 - Increase sertraline (ZOLOFT) 100 MG tablet; Take 1 tablet (100 mg total) by mouth daily.  Dispense: 90 tablet; Refill: 0 - traZODone (DESYREL) 150 MG tablet; Take 1 tablet (  150 mg total) by mouth at bedtime.  Dispense: 30 tablet; Refill: 0   Patient is also taking clonazepam 0.5 mg 3 times daily prescribed by her PCP and oxycodone 10 mg prescribed by her pain management provider. Continue individual therapy. Follow-up in 2 months.  [place] Crane, MD 10/19/2019, 4:10 PM

## 2019-10-21 ENCOUNTER — Ambulatory Visit (INDEPENDENT_AMBULATORY_CARE_PROVIDER_SITE_OTHER): Payer: Commercial Managed Care - PPO | Admitting: Licensed Clinical Social Worker

## 2019-10-21 ENCOUNTER — Other Ambulatory Visit: Payer: Self-pay

## 2019-10-21 ENCOUNTER — Encounter (HOSPITAL_COMMUNITY): Payer: Self-pay | Admitting: Licensed Clinical Social Worker

## 2019-10-21 DIAGNOSIS — F4310 Post-traumatic stress disorder, unspecified: Secondary | ICD-10-CM

## 2019-10-21 DIAGNOSIS — F418 Other specified anxiety disorders: Secondary | ICD-10-CM | POA: Diagnosis not present

## 2019-10-21 NOTE — Progress Notes (Signed)
Virtual Visit via Video Note  I connected with Martha Espinoza on 10/21/19 at  2:30 PM EDT by a video enabled telemedicine application and verified that I am speaking with the correct person using two identifiers.  Location: Patient: home Provider: office   I discussed the limitations of evaluation and management by telemedicine and the availability of in person appointments. The patient expressed understanding and agreed to proceed.   Type of Therapy: Individual Therapy   Treatment Goals addressed: "PTSD, depression and anxiety. I would like to feel more like myself"   Interventions:  CBT, Mindfulness   Summary: Martha Espinoza is a 54 y.o. female who presents with PTSD and Depression with Anxiety.   Suicidal/Homicidal: No without intent/plan   Therapist Response: Martha Espinoza met with clinician for an individual session. Martha Espinoza shared about her psychiatric symptoms, her current life events and her homework. Martha Espinoza reports today was a bad day because it was her best friend Martha Espinoza's birthday. Martha Espinoza explained that Martha Espinoza died last year and they would have gone out to lunch and shopping today. Clinician engaged in grief/ CBT counseling with Martha Espinoza to explore options for making this a day Martha Espinoza would have enjoyed. Clinician discussed ways to reclaim birthdays and anniversaries of death as a celebration day. Clinician also encouraged Martha Espinoza to write to Martha Espinoza in her journal and allow herself to cry.  Clinician explored family relationships and processed recent interactions with her son and daughter in law, as well as her granddaughter and the child's mother. Martha Espinoza identified that there was some pulling away from the child's mother. Clinician normalized this and reminded Martha Espinoza that her primary focus should be on her granddaughter.    Plan: Return again in 1-2 weeks.   Diagnosis: Axis I: PTSD and Depression with Anxiety.  I discussed the assessment and treatment plan with the patient. The patient was  provided an opportunity to ask questions and all were answered. The patient agreed with the plan and demonstrated an understanding of the instructions.   The patient was advised to call back or seek an in-person evaluation if the symptoms worsen or if the condition fails to improve as anticipated.  I provided 45 minutes of non-face-to-face time during this encounter.   [name], LCSW

## 2019-10-22 ENCOUNTER — Encounter: Payer: Self-pay | Admitting: Family Medicine

## 2019-10-24 ENCOUNTER — Encounter: Payer: Self-pay | Admitting: Family Medicine

## 2019-10-28 ENCOUNTER — Encounter (HOSPITAL_COMMUNITY): Payer: Self-pay | Admitting: Licensed Clinical Social Worker

## 2019-10-28 ENCOUNTER — Other Ambulatory Visit: Payer: Self-pay

## 2019-10-28 ENCOUNTER — Ambulatory Visit (INDEPENDENT_AMBULATORY_CARE_PROVIDER_SITE_OTHER): Payer: Commercial Managed Care - PPO | Admitting: Licensed Clinical Social Worker

## 2019-10-28 DIAGNOSIS — F418 Other specified anxiety disorders: Secondary | ICD-10-CM

## 2019-10-28 DIAGNOSIS — F4310 Post-traumatic stress disorder, unspecified: Secondary | ICD-10-CM

## 2019-10-28 NOTE — Progress Notes (Signed)
Virtual Visit via Video Note  I connected with Martha Espinoza on 10/28/19 at  3:30 PM EDT by a video enabled telemedicine application and verified that I am speaking with the correct person using two identifiers.  Location: Patient: home Provider: office   I discussed the limitations of evaluation and management by telemedicine and the availability of in person appointments. The patient expressed understanding and agreed to proceed.  Type of Therapy: Individual Therapy   Treatment Goals addressed: "PTSD, depression and anxiety. I would like to feel more like myself"   Interventions:  CBT, Mindfulness   Summary: Martha Espinoza is a 54 y.o. female who presents with PTSD and Depression with Anxiety.   Suicidal/Homicidal: No without intent/plan   Therapist Response: Martha Espinoza met with clinician for an individual session. Martha Espinoza shared about her psychiatric symptoms, her current life events and her homework. Martha Espinoza reported that she was "not sure" how she felt today. Clinician utilized CBT to process thoughts, feelings, and behaviors, as well as recent stressors in her environment. Clinician identified increased depressed behaviors, such as sluggishness, lack of energy, and disinterest in doing things. Clinician explored health matters and identified ongoing problems with falls. Clinician processed the impact of these falls on her self esteem, feelings of being a burden, and frustration that she has not received a diagnosis or reason for her poor health, even though she is only 53. Clinician processed these feelings using CBT and explored the use of radical acceptance. Martha Espinoza reported that she feels unable to do that now. Clinician utilized mindfulness as a way to soothe Martha Espinoza and improve mood.    Plan: Return again in 1-2 weeks.   Diagnosis: Axis I: PTSD and Depression with Anxiety.       I discussed the assessment and treatment plan with the patient. The patient was provided an opportunity to ask  questions and all were answered. The patient agreed with the plan and demonstrated an understanding of the instructions.   The patient was advised to call back or seek an in-person evaluation if the symptoms worsen or if the condition fails to improve as anticipated.  I provided 45 minutes of non-face-to-face time during this encounter.   [name], LCSW

## 2019-11-05 ENCOUNTER — Other Ambulatory Visit: Payer: Self-pay | Admitting: Family Medicine

## 2019-11-05 NOTE — Telephone Encounter (Signed)
Rx sent to pharmacy   

## 2019-11-11 ENCOUNTER — Ambulatory Visit (INDEPENDENT_AMBULATORY_CARE_PROVIDER_SITE_OTHER): Payer: Commercial Managed Care - PPO | Admitting: Licensed Clinical Social Worker

## 2019-11-11 ENCOUNTER — Other Ambulatory Visit: Payer: Self-pay

## 2019-11-11 ENCOUNTER — Encounter (HOSPITAL_COMMUNITY): Payer: Self-pay | Admitting: Licensed Clinical Social Worker

## 2019-11-11 DIAGNOSIS — F4310 Post-traumatic stress disorder, unspecified: Secondary | ICD-10-CM | POA: Diagnosis not present

## 2019-11-11 DIAGNOSIS — F418 Other specified anxiety disorders: Secondary | ICD-10-CM

## 2019-11-11 NOTE — Progress Notes (Signed)
Virtual Visit via Video Note  I connected with Martha Espinoza on 11/11/19 at  3:30 PM EDT by a video enabled telemedicine application and verified that I am speaking with the correct person using two identifiers.  Location: Patient: home Provider: office   I discussed the limitations of evaluation and management by telemedicine and the availability of in person appointments. The patient expressed understanding and agreed to proceed.  Type of Therapy: Individual Therapy   Treatment Goals addressed: "PTSD, depression and anxiety. I would like to feel more like myself"   Interventions:  Motivational Interviewing   Summary: Martha Espinoza is a 54 y.o. female who presents with PTSD and Depression with Anxiety.   Suicidal/Homicidal: No without intent/plan   Therapist Response: Martha Espinoza met with clinician for an individual session. [name] shared about her psychiatric symptoms, her current life events and her homework. [name] reported that she has been more depressed over the past few weeks as her grief over the loss of her best friend has taken hold. Clinician utilized MI OARS to reflect and summarize thoughts and feelings. Clinician identified this loneliness and sadness as part of the acceptance process. Clinician explored other feelings, such as anger, and normalized this feeling. Clinician encouraged Martha Espinoza to continue writing to her best friend in a journal, and to listen for her wisdom in her mind, because Martha Espinoza knows what Martha Espinoza would say. Clinician provided time and space for Martha Espinoza to tell stories about Martha Espinoza from the past, and to remember times that were impactful.    Plan: Return again in 1-2 weeks.   Diagnosis: Axis I: PTSD and Depression with Anxiety.      I discussed the assessment and treatment plan with the patient. The patient was provided an opportunity to ask questions and all were answered. The patient agreed with the plan and demonstrated an understanding of the instructions.    The patient was advised to call back or seek an in-person evaluation if the symptoms worsen or if the condition fails to improve as anticipated.  I provided 45 minutes of non-face-to-face time during this encounter.   [name], LCSW

## 2019-11-18 ENCOUNTER — Ambulatory Visit (INDEPENDENT_AMBULATORY_CARE_PROVIDER_SITE_OTHER): Payer: Commercial Managed Care - PPO | Admitting: Licensed Clinical Social Worker

## 2019-11-18 ENCOUNTER — Other Ambulatory Visit: Payer: Self-pay

## 2019-11-18 ENCOUNTER — Encounter (HOSPITAL_COMMUNITY): Payer: Self-pay | Admitting: Licensed Clinical Social Worker

## 2019-11-18 DIAGNOSIS — F4310 Post-traumatic stress disorder, unspecified: Secondary | ICD-10-CM | POA: Diagnosis not present

## 2019-11-18 DIAGNOSIS — F418 Other specified anxiety disorders: Secondary | ICD-10-CM

## 2019-11-18 NOTE — Progress Notes (Signed)
Virtual Visit via Video Note  I connected with SHAELYN DECARLI on 11/18/19 at  3:30 PM EDT by a video enabled telemedicine application and verified that I am speaking with the correct person using two identifiers.  Location: Patient: home Provider: office   I discussed the limitations of evaluation and management by telemedicine and the availability of in person appointments. The patient expressed understanding and agreed to proceed.  Type of Therapy: Individual Therapy   Treatment Goals addressed: "PTSD, depression and anxiety. I would like to feel more like myself"   Interventions:  Motivational Interviewing, CBT   Summary: Helem Reesor is a 54 y.o. female who presents with PTSD and Depression with Anxiety.   Suicidal/Homicidal: No without intent/plan   Therapist Response: Almyra Free met with clinician for an individual session. Cassara shared about her psychiatric symptoms, her current life events and her homework. Trinda reported ongoing depressed mood, as she has concerns that her husband may be cheating on her. Clinician utilized MI OARS to reflect and summarize thoughts and feelings. Clinician explored evidence and discussed how she has and will continue to address this with husband. Clinician utlized CBT explored pros and cons of knowing the truth, even if it is something she does not want to hear. Kadeidra agreed that it is better to know the truth. Clinician also encouraged Joan to reduce the negativity and bossiness that happens when she tries to control things and to share her fears and emotional insecurities with her husband about this matter.     Plan: Return again in 1-2 weeks.   Diagnosis: Axis I: PTSD and Depression with Anxiety.    I discussed the assessment and treatment plan with the patient. The patient was provided an opportunity to ask questions and all were answered. The patient agreed with the plan and demonstrated an understanding of the instructions.   The patient was  advised to call back or seek an in-person evaluation if the symptoms worsen or if the condition fails to improve as anticipated.  I provided 45 minutes of non-face-to-face time during this encounter.   [name], LCSW

## 2019-11-26 ENCOUNTER — Other Ambulatory Visit: Payer: Self-pay

## 2019-11-26 ENCOUNTER — Encounter (HOSPITAL_COMMUNITY): Payer: Self-pay | Admitting: Licensed Clinical Social Worker

## 2019-11-26 ENCOUNTER — Ambulatory Visit (INDEPENDENT_AMBULATORY_CARE_PROVIDER_SITE_OTHER): Payer: Commercial Managed Care - PPO | Admitting: Licensed Clinical Social Worker

## 2019-11-26 DIAGNOSIS — F4310 Post-traumatic stress disorder, unspecified: Secondary | ICD-10-CM

## 2019-11-26 DIAGNOSIS — F418 Other specified anxiety disorders: Secondary | ICD-10-CM

## 2019-11-26 NOTE — Progress Notes (Signed)
Virtual Visit via Video Note  I connected with Martha Espinoza on 11/26/19 at  2:30 PM EDT by a video enabled telemedicine application and verified that I am speaking with the correct person using two identifiers.  Location: Patient: home Provider: office   I discussed the limitations of evaluation and management by telemedicine and the availability of in person appointments. The patient expressed understanding and agreed to proceed.   Type of Therapy: Individual Therapy   Treatment Goals addressed: "PTSD, depression and anxiety. I would like to feel more like myself"   Interventions:  CBT   Summary: Martha Espinoza is a 54 y.o. female who presents with PTSD and Depression with Anxiety.   Suicidal/Homicidal: No without intent/plan   Therapist Response: Almyra Free met with Martha Espinoza for an individual session. Martha Espinoza shared about her psychiatric symptoms, her current life events and her homework. Martha Espinoza reported she continues to feel anxious and depressed daily. She reports she is very worried about her ex getting out of prison next year, which seems to be coming up soon. She also identified depression as they were exposed to Martha Espinoza and have been quarantined for the past 10 day. Martha Espinoza explored sxs and noted that she and her household have not experienced any sxs, nor has her granddaughter, but the parents of her granddaughter were both Covid +. Martha Espinoza explored Martha Espinoza's thoughts and feelings about getting vaccinated and noted that Martha Espinoza fears how the vaccine would interact with her medications. Martha Espinoza encouraged Martha Espinoza to speak with PCP about this.  Martha Espinoza utilized CBT to process anxiety and provided coping skill of activating the "mammalian dive reflex". Martha Espinoza encouraged Bena to run cold water on her hands or splash on her face. Martha Espinoza provided psychoeducation about why this works and how this will be helpful for her to stop anxiety.    Plan: Return again in 1-2 weeks.   Diagnosis:  Axis I: PTSD and Depression with Anxiety.  I discussed the assessment and treatment plan with the patient. The patient was provided an opportunity to ask questions and all were answered. The patient agreed with the plan and demonstrated an understanding of the instructions.   The patient was advised to call back or seek an in-person evaluation if the symptoms worsen or if the condition fails to improve as anticipated.  I provided 45 minutes of non-face-to-face time during this encounter.   [name], LCSW

## 2019-12-03 ENCOUNTER — Ambulatory Visit (INDEPENDENT_AMBULATORY_CARE_PROVIDER_SITE_OTHER): Payer: Commercial Managed Care - PPO | Admitting: Licensed Clinical Social Worker

## 2019-12-03 ENCOUNTER — Encounter (HOSPITAL_COMMUNITY): Payer: Self-pay | Admitting: Licensed Clinical Social Worker

## 2019-12-03 ENCOUNTER — Telehealth (HOSPITAL_COMMUNITY): Payer: Self-pay | Admitting: Licensed Clinical Social Worker

## 2019-12-03 ENCOUNTER — Other Ambulatory Visit: Payer: Self-pay

## 2019-12-03 DIAGNOSIS — F418 Other specified anxiety disorders: Secondary | ICD-10-CM

## 2019-12-03 DIAGNOSIS — F4310 Post-traumatic stress disorder, unspecified: Secondary | ICD-10-CM | POA: Diagnosis not present

## 2019-12-03 NOTE — Progress Notes (Signed)
Virtual Visit via Video Note  I connected with Martha Espinoza on 12/03/19 at  2:30 PM EDT by a video enabled telemedicine application and verified that I am speaking with the correct person using two identifiers.  Location: Patient: home Provider: office   I discussed the limitations of evaluation and management by telemedicine and the availability of in person appointments. The patient expressed understanding and agreed to proceed.    Type of Therapy: Individual Therapy   Treatment Goals addressed: "PTSD, depression and anxiety. I would like to feel more like myself"   Interventions:  CBT   Summary: Martha Espinoza is a 54 y.o. female who presents with PTSD and Depression with Anxiety.   Suicidal/Homicidal: No without intent/plan   Therapist Response: Martha Espinoza met with clinician for an individual session. Martha Espinoza shared about her psychiatric symptoms, her current life events and her homework. Martha Espinoza reported she feels bad all the time. Clinician explored this report and noted that due to chronic pain and inability to walk or even get up by herself, she not only has pain, but also guilt. Clinician utilized CBT to process these feelings of guilt and noted that her health has consumed her life and she feels guilty that her family has to take care of her. Martha Espinoza also expressed guilt over her inability to care for her mother the way she has in the past. Clinician explored Martha Espinoza's perception of this guilt, noting cognitive distortion that others are somehow disappointed in her for having poor health. Clinician discussed how projection of her feelings on others has damaged relationships in the past. Clinician also noted that her high level of judgement on herself only intensifies her feelings of guilt.    Plan: Return again in 1-2 weeks.   Diagnosis: Axis I: PTSD and Depression with Anxiety.      I discussed the assessment and treatment plan with the patient. The patient was provided an opportunity to  ask questions and all were answered. The patient agreed with the plan and demonstrated an understanding of the instructions.   The patient was advised to call back or seek an in-person evaluation if the symptoms worsen or if the condition fails to improve as anticipated.  I provided 45 minutes of non-face-to-face time during this encounter.   [name], LCSW

## 2019-12-08 ENCOUNTER — Other Ambulatory Visit: Payer: Self-pay | Admitting: Family Medicine

## 2019-12-09 ENCOUNTER — Other Ambulatory Visit: Payer: Self-pay

## 2019-12-09 ENCOUNTER — Ambulatory Visit (INDEPENDENT_AMBULATORY_CARE_PROVIDER_SITE_OTHER): Payer: Commercial Managed Care - PPO | Admitting: Licensed Clinical Social Worker

## 2019-12-09 ENCOUNTER — Encounter (HOSPITAL_COMMUNITY): Payer: Self-pay | Admitting: Licensed Clinical Social Worker

## 2019-12-09 DIAGNOSIS — F4310 Post-traumatic stress disorder, unspecified: Secondary | ICD-10-CM

## 2019-12-09 DIAGNOSIS — F418 Other specified anxiety disorders: Secondary | ICD-10-CM | POA: Diagnosis not present
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
## 2019-12-09 NOTE — Telephone Encounter (Signed)
RF request for clonazepam 0.5mg  Last OV 10/16/19 No upcoming OV Last RX 06/01/19 # 90 x 5 rfs.  Please advise.

## 2019-12-16 ENCOUNTER — Encounter (HOSPITAL_COMMUNITY): Payer: Self-pay | Admitting: Licensed Clinical Social Worker

## 2019-12-16 ENCOUNTER — Other Ambulatory Visit: Payer: Self-pay

## 2019-12-16 ENCOUNTER — Ambulatory Visit (INDEPENDENT_AMBULATORY_CARE_PROVIDER_SITE_OTHER): Payer: Commercial Managed Care - PPO | Admitting: Licensed Clinical Social Worker

## 2019-12-16 DIAGNOSIS — F4310 Post-traumatic stress disorder, unspecified: Secondary | ICD-10-CM

## 2019-12-16 NOTE — Progress Notes (Signed)
Virtual Visit via Video Note  I connected with Martha Espinoza on 12/16/19 at  3:30 PM EDT by a video enabled telemedicine application and verified that I am speaking with the correct person using two identifiers.  Location: Patient: home Provider: office   I discussed the limitations of evaluation and management by telemedicine and the availability of in person appointments. The patient expressed understanding and agreed to proceed.  Type of Therapy: Individual Therapy   Treatment Goals addressed: "PTSD, depression and anxiety. I would like to feel more like myself"   Interventions:  CBT   Summary: Martha Espinoza is a 54 y.o. female who presents with PTSD and Depression with Anxiety.   Suicidal/Homicidal: No without intent/plan   Therapist Response: Martha Espinoza met with clinician for an individual session. Martha Espinoza shared about her psychiatric symptoms, her current life events and her homework. Martha Espinoza reported she feels depressed and cannot shake it. Clinician explored triggers and processed the relationship between Martha Espinoza and her husband. Clinician encouraged Martha Espinoza to empathize with husband about how it has been for him, losing her, getting her back, and coping with her illness. Clinician explored Martha Espinoza's feelings that he is a stranger to her and that he is so different now. Clinician discussed the importance of getting to know him again, to reestablish safety and intimacy.     Plan: Return again in 1-2 weeks.   Diagnosis: Axis I: PTSD and Depression with Anxiety.        I discussed the assessment and treatment plan with the patient. The patient was provided an opportunity to ask questions and all were answered. The patient agreed with the plan and demonstrated an understanding of the instructions.   The patient was advised to call back or seek an in-person evaluation if the symptoms worsen or if the condition fails to improve as anticipated.  I provided 45 minutes of non-face-to-face time  during this encounter.   [name], LCSW

## 2019-12-17 ENCOUNTER — Other Ambulatory Visit: Payer: Self-pay

## 2019-12-17 ENCOUNTER — Encounter (HOSPITAL_COMMUNITY): Payer: Self-pay | Admitting: Psychiatry

## 2019-12-17 ENCOUNTER — Telehealth (INDEPENDENT_AMBULATORY_CARE_PROVIDER_SITE_OTHER): Payer: Commercial Managed Care - PPO | Admitting: Psychiatry

## 2019-12-17 DIAGNOSIS — F418 Other specified anxiety disorders: Secondary | ICD-10-CM

## 2019-12-17 DIAGNOSIS — F4310 Post-traumatic stress disorder, unspecified: Secondary | ICD-10-CM | POA: Diagnosis not present

## 2019-12-17 MED ORDER — SERTRALINE HCL 100 MG PO TABS: 100.0000 mg | ORAL_TABLET | Freq: Every day | ORAL | 0 refills | Status: DC

## 2019-12-17 MED ORDER — TRAZODONE HCL 150 MG PO TABS: 150.0000 mg | ORAL_TABLET | Freq: Every day | ORAL | 0 refills | Status: AC

## 2019-12-17 MED ORDER — LAMOTRIGINE 100 MG PO TABS: ORAL_TABLET | ORAL | 0 refills | Status: AC

## 2019-12-17 NOTE — Progress Notes (Signed)
[facility] MD OP Progress Note  Virtual Visit via Telephone Note  I connected with Martha Espinoza on 12/17/19 at 11:40 AM EDT by telephone and verified that I am speaking with the correct person using two identifiers.  Location: Patient: home Provider: Clinic   I discussed the limitations, risks, security and privacy concerns of performing an evaluation and management service by telephone and the availability of in person appointments. I also discussed with the patient that there may be a patient responsible charge related to this service. The patient expressed understanding and agreed to proceed.   I provided 17 minutes of non-face-to-face time during this encounter.    12/17/2019 11:36 AM Martha Espinoza  MRN:  [id number]  Chief Complaint: "I have been dealing with a lot of stuff."  HPI: Patient informed that she has been doing a lot of personal stuff and she spoke to the therapist Ms. Janett Billow last evening.  She stated that talking to her was helpful as she helped her process through some of the things.  She stated that she had 2 or 3 falls a couple weeks ago and around the time her PCP took her off of her blood pressure medicine.  She stated that ever since she went off the blood pressure medicine she has done well.  She thinks that it was a blood pressure medicine that was making her lightheaded and contributing to the falls.  She stated that her pain management doctor took her off of the buspirone and she started taking Klonopin along with the oxycodone.  She denied any other concerns at this time.   Visit Diagnosis:    ICD-10-CM   1. PTSD (post-traumatic stress disorder)  F43.10 sertraline (ZOLOFT) 100 MG tablet  2. Depression with anxiety  F41.8 lamoTRIgine (LAMICTAL) 100 MG tablet    sertraline (ZOLOFT) 100 MG tablet    traZODone (DESYREL) 150 MG tablet    Past Psychiatric History: Depression, anxiety, PTSD  Past Medical History:  Past Medical History:  Diagnosis Date  .  Abnormal CT of thoracic spine 09/12/2018   Noncontrast.  Increased in number and TINY increase in size of tiny, patchy sclerotic T spine lesions compared to 2018 CT.  Hem/onc summer 2020->w/u->suspected benign bone islands->survellance imaging in 6-12 mo planned.  . Allergic rhinitis   . Arthritis   . Asthma   . Bipolar 1 disorder ([place])   . Chronic pain syndrome    chronic low back pain (Pain mgmt= Dr. [name]).  MRI showed no signif disc dz, only showed some facet arthropathy at L4-5, with joint diastasis at L4-5--plan as of 11/07/16 neurosurg eval is bilat facet injections. (chronic LBP+ fibromyalgia)  . Colitis due to Clostridium difficile 2001  . COPD (chronic obstructive pulmonary disease) ([place])    PFTs 04/2016 showed no sign of copd or asthma, plus she actually had worse FEV1 after taking albuterol.  However, as of 06/2018 she has recent hx of improvement in sx's after taking albuterol.  LONG TIME SMOKER, won't quit.  Intolerant to or cannot afford: pulmicort, QVAR, advair, symbicort, flovent, bevespi, and spiriva, anoro,stiolto, and trelegy. Pulmicort trial per pulm 12/2018  . Depression   . Fibromyalgia syndrome   . GAD (generalized anxiety disorder)   . GERD (gastroesophageal reflux disease)   . Hepatic hemangioma   . Hepatic steatosis 06/2016   Noted incidentally on CT chest 2018 and 2021  . History of Salmonella gastroenteritis   . Hoarseness    chronic: laryngoscopy showed edema of vocal folds  and R focal fold leukoplakia->>NEEDS TO QUIT SMOKING! Rpt laryngoscopy 03/17/19 showed the leukoplakia spot smaller->to be followed with serial exams by ent. 06/15/19->MUCH imroved vocal fold edema and vocal cord leukoplakia: less hoarseness with smoking cessation!  . Hyperlipidemia    a. Noted 02/2014.  Marland Kitchen Hypertension   . IBS (irritable bowel syndrome)   . Iron deficiency anemia 05/2014   Per pt it is not due to vaginal blood loss; hemoccults sent to pt in mail 413/16.  . Lumbar spondylosis     Mild, mainly focused at L4-5 and L5-S1.  MRI L spine 09/09/17-->mild degenerative disc disease and facet arthrosis without any nerve root encroachment, no spinal stenosis.  . Microscopic hematuria    Intermittent (no w/u done yet, as of 03/2014)  . Migraine headache   . Morbid obesity ([place])   . Multiple lipomas 09/2016   Left upper arm--very small ones.  . Nocturnal hypoxia 09/18/2017   Overnight oximetry -->qualified: ordered 2L oxygen during sleep.  . Obesity hypoventilation syndrome ([place])   . Pain in both lower legs 2018-2019   Pain and numbness bilat LLs.  NCS/EMGs findings suggestive of S1 radiculopathy bilat: sx's + NCS findings suggestive of neurogenic claudication/spinal stenosis.  ? contribution of venous reflux?-->"idiopathic peripheral neuropathy, progressive->neurol/pain mgmt  . Peripheral edema Fall 2018   R>L, non-pitting for the most part--venous doppler neg for DVT 01/2017.  Saw Dr. Bridgett Larsson (Vasc) 04/12/17 and u/s showed some venous reflux dz but he felt her sx's in legs were NOT due to her venous insufficiency OR PAD.  Thigh high comp stockings rx'd.  . Polycystic ovarian syndrome   . Pruritic dermatitis 2015/2016   +[facility] at [facility] 07/2014. (Primarily pruritic skin, but eventually a subtle rash as well)  . PTSD (post-traumatic stress disorder)   . Raynaud's phenomenon 2019  . Rectal cancer ([place])    a. Followed by Dr. Gala Romney, dx 2000-2001.  Surveillance colonoscopy planned as of 11/2018 GI f/u.  . Right shoulder pain 2017   Murphy/Wainer: RC bursitis + RC tear (MRI)--injection trial is plan per pt report 05/04/16.  . Vitamin D deficiency   . Weakness of both lower extremities 2020-2021   Dr. Merlene Laughter primary neurol, second opinion with [facility] neuro Dr. Krista Blue 07/2019--w/u unrevealing of cause  . Wheezing    Most of her resp symptoms are c/w upper airway etiology (repeated PFTs ->no obstructive dz. CT chest 2021 w/some air-trapping/small airways dz, though). Pulm  referred her to ENT 12/2018.    Past Surgical History:  Procedure Laterality Date  . ABI's complete Bilateral 09/30/2017   Normal ABIs.  Great toe pressures adequate for wound healing.  Femoral artery waveforms triphasic (normal).  . CARDIOVASCULAR STRESS TEST  03/24/14   Lexscan MIBI: mild anterior ischemia?  Cardiac CT angiogram recommended/done.  . CHOLECYSTECTOMY    . COLONOSCOPY  05/05/2007   adenoma  . coronary CT angio  03/29/14   Two vessel dz/moderate stenosis of mid LAD and proximal RCA; cath recommended.  Marland Kitchen DILATION AND CURETTAGE OF UTERUS     for vaginal bleeding  . EVENT MONITOR  04/2019   No signif abnormality (Dr. Domenic Polite)  . JOINT REPLACEMENT    . laryngoscopy  02/11/2019   (Done b/c of hoarseness and hx of wheezing"Reinke's edema of vocal folds, right vocal cord leukoplakia-->plan for bx of leukoplakia->NEEDS TO QUIT SMOKING!  . LEFT HEART CATHETERIZATION WITH CORONARY ANGIOGRAM N/A 03/31/2014   Normal coronaries, EF 42-70%, diastolic dysfunction.  Procedure: LEFT HEART CATHETERIZATION  WITH CORONARY ANGIOGRAM;  Surgeon: Sinclair Grooms, MD;  Location: [facility] CATH LAB;  Service: Cardiovascular;  Laterality: N/A;  . LOWER EXT VENOUS DOPPLERS Bilateral 02/08/2017   NEG for DVT (bilat)  . NCS/EMG  10/03/2017   S1 radiculopathy bilat  . Overnight oximetry testing  09/2017   qualified for oxygen supplementation during sleep  . PFTs  04/2016   No sign of COPD or asthma; FEV 1 worse after albut.  . rectal mss  age 30   High-grade rectal adenoma removed from rectum   . TRANSTHORACIC ECHOCARDIOGRAM  03/23/14; 10/21/17; 05/12/19   2015 Normal.  2019: mild LVH, EF 65-70%, study technically inadequate to assess diastolic function, valves were fine, wall motion normal. 2021 EF 60-65%-->all normal.    Family Psychiatric History: See below  Family History:  Family History  Problem Relation Age of Onset  . Colon cancer Father 64  . Cancer Father   . Heart disease Mother   .  Hypertension Mother   . Hyperlipidemia Mother   . Mental illness Mother   . Diabetes Mother   . Heart attack Mother   . Thyroid disease Mother   . Anxiety disorder Mother   . Depression Mother   . Colon cancer Maternal Grandfather   . Bipolar disorder Son   . Stroke Neg Hx     Social History:  Social History   Socioeconomic History  . Marital status: Married    Spouse name: tracey  . Number of children: 1  . Years of education: Not on file  . Highest education level: 9th grade  Occupational History  . Occupation: own Games developer: [employer]  Tobacco Use  . Smoking status: Former Smoker    Packs/day: 1.00    Years: 18.00    Pack years: 18.00    Types: Cigarettes  . Smokeless tobacco: Never Used  . Tobacco comment: 25 years  Vaping Use  . Vaping Use: Never used  Substance and Sexual Activity  . Alcohol use: No    Alcohol/week: 0.0 standard drinks  . Drug use: No  . Sexual activity: Not Currently    Birth control/protection: None, Surgical  Other Topics Concern  . Not on file  Social History Narrative   A friend   Social Determinants of Health   Financial Resource Strain:   . Difficulty of Paying Living Expenses: Not on file  Food Insecurity:   . Worried About Charity fundraiser in the Last Year: Not on file  . Ran Out of Food in the Last Year: Not on file  Transportation Needs:   . Lack of Transportation (Medical): Not on file  . Lack of Transportation (Non-Medical): Not on file  Physical Activity:   . Days of Exercise per Week: Not on file  . Minutes of Exercise per Session: Not on file  Stress:   . Feeling of Stress : Not on file  Social Connections:   . Frequency of Communication with Friends and Family: Not on file  . Frequency of Social Gatherings with Friends and Family: Not on file  . Attends Religious Services: Not on file  . Active Member of Clubs or Organizations: Not on file  . Attends Archivist  Meetings: Not on file  . Marital Status: Not on file    Allergies:  Allergies  Allergen Reactions  . Contrast Media [Iodinated Diagnostic Agents] Anaphylaxis    Needs 13-hour prep  . Shellfish Allergy Anaphylaxis  .  Codeine Nausea And Vomiting and Other (See Comments)    Hallucinations, Also sees people that are not there   . Furosemide Rash    Rash, soB.  Marland Kitchen Anoro Ellipta [Umeclidinium-Vilanterol] Other (See Comments)    Pt states it caused blisters.  . Flovent Diskus [Fluticasone Propionate (Inhal)]     headache  . Ivermectin Nausea And Vomiting    N/V and rash  . Other   . Spiriva Handihaler [Tiotropium Bromide Monohydrate] Other (See Comments)    Severe headaches  . Symbicort [Budesonide-Formoterol Fumarate]     headache  . Betadine [Povidone Iodine] Rash  . Latex Hives  . Lipitor [Atorvastatin] Itching  . Permethrin Itching  . Stiolto Respimat [Tiotropium Bromide-Olodaterol] Other (See Comments)    Mouth ulcers  . Tape Dermatitis    Paper tape and clear  . Trelegy Ellipta [Fluticasone-Umeclidin-Vilant] Other (See Comments)    headaches    Metabolic Disorder Labs: Lab Results  Component Value Date   HGBA1C 5.8 09/24/2018   MPG 123 (H) 03/23/2014   No results found for: PROLACTIN Lab Results  Component Value Date   CHOL 186 05/27/2019   TRIG 264.0 (H) 05/27/2019   HDL 33.80 (L) 05/27/2019   CHOLHDL 6 05/27/2019   VLDL 52.8 (H) 05/27/2019   LDLCALC 144 (H) 02/06/2019   LDLCALC 169 (H) 03/24/2014   Lab Results  Component Value Date   TSH 1.120 08/13/2019   TSH 1.12 10/08/2016    Therapeutic Level Labs: No results found for: LITHIUM No results found for: VALPROATE No components found for:  CBMZ  Current Medications: Current Outpatient Medications  Medication Sig Dispense Refill  . albuterol (PROVENTIL) (2.5 MG/3ML) 0.083% nebulizer solution Take 3 mLs (2.5 mg total) by nebulization every 6 (six) hours as needed for wheezing or shortness of breath.  75 mL 6  . albuterol (VENTOLIN HFA) 108 (90 Base) MCG/ACT inhaler INHALE 1 TO 2 PUFFS BY MOUTH EVERY 4 HOURS AS NEEDED 16 g 11  . aspirin 81 MG chewable tablet Chew 1 tablet (81 mg total) by mouth daily. (Patient not taking: Reported on 10/16/2019) 14 tablet 0  . azelastine (ASTELIN) 0.1 % nasal spray Place 2 sprays into both nostrils 2 (two) times daily. 30 mL 6  . busPIRone (BUSPAR) 15 MG tablet Take 1 tablet (15 mg total) by mouth 2 (two) times daily. 180 tablet 0  . clonazePAM (KLONOPIN) 0.5 MG tablet TAKE ONE TABLET BY MOUTH DURING THE DAY AS NEEDED AND 2 TABLETS AT BEDTIME 90 tablet 5  . cyclobenzaprine (FLEXERIL) 10 MG tablet TAKE ONE TABLET BY MOUTH EVERY 6 HOURS AS NEEDED (SPASM RELATED TO HEADACHES) 42 tablet 5  . EQ ALLERGY RELIEF, CETIRIZINE, 10 MG tablet Take 1 tablet by mouth once daily 90 tablet 0  . fluticasone (FLONASE) 50 MCG/ACT nasal spray Place 2 sprays into both nostrils daily as needed for allergies.     Marland Kitchen lamoTRIgine (LAMICTAL) 100 MG tablet Take 2 tablets at bedtime 180 tablet 0  . losartan (COZAAR) 50 MG tablet Take 1 tablet by mouth once daily (Patient not taking: Reported on 10/16/2019) 90 tablet 0  . meloxicam (MOBIC) 15 MG tablet Take 15 mg by mouth every evening.   1  . metoprolol tartrate (LOPRESSOR) 50 MG tablet Take 1 tablet by mouth twice daily 90 tablet 1  . Naldemedine Tosylate (SYMPROIC) 0.2 MG TABS Take 0.2 mg by mouth at bedtime.     . nitroGLYCERIN (NITROSTAT) 0.4 MG SL tablet DISSOLVE ONE TABLET UNDER  THE TONGUE EVERY 5 MINUTES AS NEEDED FOR CHEST PAIN.  DO NOT EXCEED A TOTAL OF 3 DOSES IN 15 MINUTES 10 tablet 0  . oxyCODONE-acetaminophen (PERCOCET) 10-325 MG tablet Take 1 tablet by mouth in the morning, at noon, and at bedtime.     . pantoprazole (PROTONIX) 40 MG tablet Take 1 tablet (40 mg total) by mouth daily. 180 tablet 1  . Potassium (POTASSIMIN PO) Take 2 tablets by mouth at bedtime.    . pregabalin (LYRICA) 200 MG capsule Take 200 mg by mouth daily.      . promethazine (PHENERGAN) 25 MG tablet TAKE 1 TABLET BY MOUTH EVERY 6 HOURS AS NEEDED FOR NAUSEA 30 tablet 3  . rOPINIRole (REQUIP) 0.25 MG tablet TAKE 3 TABLETS BY MOUTH AT BEDTIME 270 tablet 0  . rosuvastatin (CRESTOR) 20 MG tablet Take 1 tablet by mouth once daily 90 tablet 1  . sertraline (ZOLOFT) 100 MG tablet Take 1 tablet (100 mg total) by mouth daily. 90 tablet 0  . topiramate (TOPAMAX) 50 MG tablet Take 50 mg by mouth as needed.  (Patient not taking: Reported on 09/07/2019)    . traZODone (DESYREL) 150 MG tablet Take 1 tablet (150 mg total) by mouth at bedtime. 30 tablet 0   No current facility-administered medications for this visit.     Psychiatric Specialty Exam: Review of Systems  There were no vitals taken for this visit.There is no height or weight on file to calculate BMI.  General Appearance: fairly groomed  Eye Contact:  good  Speech:  Clear and Coherent and Normal Rate  Volume:  Normal  Mood:  Anxious  Affect:  Congruent  Thought Process:  Goal Directed, Linear and Descriptions of Associations: Intact  Orientation:  Full (Time, Place, and Person)  Thought Content: Logical   Suicidal Thoughts:  No  Homicidal Thoughts:  No  Memory:  Immediate;   Good Recent;   Fair Remote;   Fair  Judgement:  Fair  Insight:  Fair  Psychomotor Activity:  Normal  Concentration:  Concentration: Good and Attention Span: Good  Recall:  Good  Fund of Knowledge: Fair  Language: Good  Akathisia:  Negative  Handed:  Right  AIMS (if indicated): not done   Assets:  Communication Skills Desire for Improvement Financial Resources/Insurance Housing Social Support  ADL's:  Intact  Cognition: WNL  Sleep:  Fair   Screenings: PHQ2-9     Office Visit from 08/16/2017 in [facility] Visit from 07/17/2017 in [place]  PHQ-2 Total Score 2 6  PHQ-9 Total Score 18 18       Assessment and Plan: Pt denied any acute concerns at this  time.  1. PTSD (post-traumatic stress disorder)  - sertraline (ZOLOFT) 100 MG tablet; Take 1 tablet (100 mg total) by mouth daily.  Dispense: 90 tablet; Refill: 0  2. Depression with anxiety  - lamoTRIgine (LAMICTAL) 100 MG tablet; Take 2 tablets at bedtime  Dispense: 180 tablet; Refill: 0 - Sertraline (ZOLOFT) 100 MG tablet; Take 1 tablet (100 mg total) by mouth daily.  Dispense: 90 tablet; Refill: 0 - traZODone (DESYREL) 150 MG tablet; Take 1 tablet (150 mg total) by mouth at bedtime.  Dispense: 30 tablet; Refill: 0   Patient is also taking clonazepam 0.5 mg 3 times daily prescribed by her PCP and oxycodone 10 mg prescribed by her pain management provider. Continue individual therapy. Follow-up in 2 months.   N[place]ada Crane, MD  12/17/2019, 11:36 AM

## 2019-12-23 ENCOUNTER — Encounter (HOSPITAL_COMMUNITY): Payer: Self-pay | Admitting: Licensed Clinical Social Worker

## 2019-12-23 ENCOUNTER — Ambulatory Visit (INDEPENDENT_AMBULATORY_CARE_PROVIDER_SITE_OTHER): Payer: Commercial Managed Care - PPO | Admitting: Licensed Clinical Social Worker

## 2019-12-23 ENCOUNTER — Other Ambulatory Visit: Payer: Self-pay

## 2019-12-23 DIAGNOSIS — F4310 Post-traumatic stress disorder, unspecified: Secondary | ICD-10-CM | POA: Diagnosis not present

## 2019-12-23 DIAGNOSIS — F418 Other specified anxiety disorders: Secondary | ICD-10-CM | POA: Diagnosis not present
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
## 2019-12-31 ENCOUNTER — Other Ambulatory Visit: Payer: Self-pay

## 2019-12-31 ENCOUNTER — Encounter (HOSPITAL_COMMUNITY): Payer: Self-pay | Admitting: Licensed Clinical Social Worker

## 2019-12-31 ENCOUNTER — Ambulatory Visit (INDEPENDENT_AMBULATORY_CARE_PROVIDER_SITE_OTHER): Payer: Commercial Managed Care - PPO | Admitting: Licensed Clinical Social Worker

## 2019-12-31 DIAGNOSIS — F4310 Post-traumatic stress disorder, unspecified: Secondary | ICD-10-CM | POA: Diagnosis not present
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
## 2020-01-05 ENCOUNTER — Inpatient Hospital Stay (HOSPITAL_COMMUNITY): Payer: Commercial Managed Care - PPO | Attending: Hematology

## 2020-01-06 ENCOUNTER — Encounter (HOSPITAL_COMMUNITY): Payer: Self-pay | Admitting: Licensed Clinical Social Worker

## 2020-01-06 ENCOUNTER — Ambulatory Visit (INDEPENDENT_AMBULATORY_CARE_PROVIDER_SITE_OTHER): Payer: Commercial Managed Care - PPO | Admitting: Licensed Clinical Social Worker

## 2020-01-06 ENCOUNTER — Other Ambulatory Visit: Payer: Self-pay

## 2020-01-06 DIAGNOSIS — F4310 Post-traumatic stress disorder, unspecified: Secondary | ICD-10-CM | POA: Diagnosis not present

## 2020-01-06 DIAGNOSIS — F418 Other specified anxiety disorders: Secondary | ICD-10-CM | POA: Diagnosis not present
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
## 2020-01-07 ENCOUNTER — Other Ambulatory Visit: Payer: Self-pay | Admitting: Pulmonary Disease

## 2020-01-07 ENCOUNTER — Other Ambulatory Visit: Payer: Self-pay | Admitting: Family Medicine

## 2020-01-07 ENCOUNTER — Other Ambulatory Visit (HOSPITAL_COMMUNITY): Payer: Self-pay | Admitting: Psychiatry

## 2020-01-07 DIAGNOSIS — F4310 Post-traumatic stress disorder, unspecified: Secondary | ICD-10-CM

## 2020-01-07 DIAGNOSIS — F418 Other specified anxiety disorders: Secondary | ICD-10-CM

## 2020-01-10 ENCOUNTER — Encounter (HOSPITAL_COMMUNITY): Payer: Self-pay

## 2020-01-10 ENCOUNTER — Emergency Department (HOSPITAL_COMMUNITY): Payer: Commercial Managed Care - PPO

## 2020-01-10 ENCOUNTER — Emergency Department (HOSPITAL_COMMUNITY)
Admission: EM | Admit: 2020-01-10 | Discharge: 2020-01-11 | Disposition: A | Discharge status: Home / Self Care | Payer: Commercial Managed Care - PPO | Source: Home / Self Care | Attending: Emergency Medicine | Admitting: Emergency Medicine

## 2020-01-10 ENCOUNTER — Other Ambulatory Visit: Payer: Self-pay

## 2020-01-10 DIAGNOSIS — U071 COVID-19: Secondary | ICD-10-CM

## 2020-01-10 DIAGNOSIS — Z91040 Latex allergy status: Secondary | ICD-10-CM | POA: Insufficient documentation

## 2020-01-10 DIAGNOSIS — Z955 Presence of coronary angioplasty implant and graft: Secondary | ICD-10-CM | POA: Insufficient documentation

## 2020-01-10 DIAGNOSIS — R000 Tachycardia, unspecified: Secondary | ICD-10-CM | POA: Insufficient documentation

## 2020-01-10 DIAGNOSIS — Z7982 Long term (current) use of aspirin: Secondary | ICD-10-CM | POA: Insufficient documentation

## 2020-01-10 DIAGNOSIS — J441 Chronic obstructive pulmonary disease with (acute) exacerbation: Secondary | ICD-10-CM | POA: Insufficient documentation

## 2020-01-10 DIAGNOSIS — Z79899 Other long term (current) drug therapy: Secondary | ICD-10-CM | POA: Insufficient documentation

## 2020-01-10 DIAGNOSIS — Z87891 Personal history of nicotine dependence: Secondary | ICD-10-CM | POA: Insufficient documentation

## 2020-01-10 DIAGNOSIS — R1110 Vomiting, unspecified: Secondary | ICD-10-CM

## 2020-01-10 DIAGNOSIS — E876 Hypokalemia: Secondary | ICD-10-CM | POA: Diagnosis not present

## 2020-01-10 LAB — CBC WITH DIFFERENTIAL/PLATELET
Abs Immature Granulocytes: 0.01 10*3/uL (ref 0.00–0.07)
Basophils Absolute: 0 10*3/uL (ref 0.0–0.1)
Basophils Relative: 0 %
Eosinophils Absolute: 0 10*3/uL (ref 0.0–0.5)
Eosinophils Relative: 0 %
HCT: 40.3 % (ref 36.0–46.0)
Hemoglobin: 13.2 g/dL (ref 12.0–15.0)
Immature Granulocytes: 0 %
Lymphocytes Relative: 22 %
Lymphs Abs: 0.5 10*3/uL — ABNORMAL LOW (ref 0.7–4.0)
MCH: 29.7 pg (ref 26.0–34.0)
MCHC: 32.8 g/dL (ref 30.0–36.0)
MCV: 90.6 fL (ref 80.0–100.0)
Monocytes Absolute: 0.1 10*3/uL (ref 0.1–1.0)
Monocytes Relative: 5 %
Neutro Abs: 1.7 10*3/uL (ref 1.7–7.7)
Neutrophils Relative %: 73 %
Platelets: 105 10*3/uL — ABNORMAL LOW (ref 150–400)
RBC: 4.45 MIL/uL (ref 3.87–5.11)
RDW: 14.5 % (ref 11.5–15.5)
WBC: 2.3 10*3/uL — ABNORMAL LOW (ref 4.0–10.5)
nRBC: 0 % (ref 0.0–0.2)

## 2020-01-10 LAB — RESPIRATORY PANEL BY RT PCR (FLU A&B, COVID)
Influenza A by PCR: NEGATIVE
Influenza B by PCR: NEGATIVE
SARS Coronavirus 2 by RT PCR: POSITIVE — AB

## 2020-01-10 LAB — COMPREHENSIVE METABOLIC PANEL
ALT: 20 U/L (ref 0–44)
AST: 40 U/L (ref 15–41)
Albumin: 3.9 g/dL (ref 3.5–5.0)
Alkaline Phosphatase: 53 U/L (ref 38–126)
Anion gap: 11 (ref 5–15)
BUN: 8 mg/dL (ref 6–20)
CO2: 25 mmol/L (ref 22–32)
Calcium: 8.2 mg/dL — ABNORMAL LOW (ref 8.9–10.3)
Chloride: 97 mmol/L — ABNORMAL LOW (ref 98–111)
Creatinine, Ser: 0.66 mg/dL (ref 0.44–1.00)
GFR, Estimated: >60 mL/min (ref >60)
Glucose, Bld: 125 mg/dL — ABNORMAL HIGH (ref 70–99)
Potassium: 3.2 mmol/L — ABNORMAL LOW (ref 3.5–5.1)
Sodium: 133 mmol/L — ABNORMAL LOW (ref 135–145)
Total Bilirubin: 0.8 mg/dL (ref 0.3–1.2)
Total Protein: 7.4 g/dL (ref 6.5–8.1)

## 2020-01-10 MED ORDER — POTASSIUM CHLORIDE CRYS ER 20 MEQ PO TBCR: 20.0000 meq | EXTENDED_RELEASE_TABLET | Freq: Every day | ORAL | 0 refills | Status: AC

## 2020-01-10 MED ORDER — METOCLOPRAMIDE HCL 5 MG/ML IJ SOLN
10.0000 mg | Freq: Once | INTRAMUSCULAR | Status: AC
  Administered 2020-01-10: 10 mg via INTRAMUSCULAR
  Filled 2020-01-10: qty 2

## 2020-01-10 MED ORDER — LIDOCAINE VISCOUS HCL 2 % MT SOLN
15.0000 mL | Freq: Once | OROMUCOSAL | Status: AC
  Administered 2020-01-10: 15 mL via ORAL
  Filled 2020-01-10: qty 15

## 2020-01-10 MED ORDER — POTASSIUM CHLORIDE CRYS ER 20 MEQ PO TBCR
40.0000 meq | EXTENDED_RELEASE_TABLET | Freq: Once | ORAL | Status: AC
  Administered 2020-01-10: 40 meq via ORAL
  Filled 2020-01-10: qty 2

## 2020-01-10 MED ORDER — PROMETHAZINE HCL 25 MG RE SUPP: 25.0000 mg | Freq: Four times a day (QID) | RECTAL | 0 refills | Status: AC | PRN

## 2020-01-10 MED ORDER — METOCLOPRAMIDE HCL 10 MG PO TABS: 10.0000 mg | ORAL_TABLET | Freq: Three times a day (TID) | ORAL | 0 refills | Status: AC | PRN

## 2020-01-10 MED ORDER — PROMETHAZINE HCL 25 MG/ML IJ SOLN
25.0000 mg | Freq: Once | INTRAMUSCULAR | Status: AC
  Administered 2020-01-10: 25 mg via INTRAVENOUS
  Filled 2020-01-10: qty 1

## 2020-01-10 MED ORDER — ACETAMINOPHEN 500 MG PO TABS
1000.0000 mg | ORAL_TABLET | Freq: Once | ORAL | Status: AC
  Administered 2020-01-10: 1000 mg via ORAL
  Filled 2020-01-10: qty 2

## 2020-01-10 MED ORDER — SODIUM CHLORIDE 0.9 % IV BOLUS
1000.0000 mL | Freq: Once | INTRAVENOUS | Status: AC
  Administered 2020-01-10: 1000 mL via INTRAVENOUS

## 2020-01-10 MED ORDER — ALUM & MAG HYDROXIDE-SIMETH 200-200-20 MG/5ML PO SUSP
30.0000 mL | Freq: Once | ORAL | Status: AC
  Administered 2020-01-10: 30 mL via ORAL
  Filled 2020-01-10: qty 30

## 2020-01-10 NOTE — Discharge Instructions (Addendum)
If you develop worsening, continued, or recurrent abdominal pain, uncontrolled vomiting, fever, chest or back pain, or any other new/concerning symptoms then return to the ER for evaluation.  

## 2020-01-10 NOTE — ED Notes (Signed)
PT completed PO challenge successfully.

## 2020-01-10 NOTE — ED Notes (Signed)
PT unable to tolerate GI cocktail.

## 2020-01-10 NOTE — ED Triage Notes (Signed)
Pt brought in by EMS. Pt reports she took 3 home test. She had 2 negative and one positive. Pt unable to tell me exactly when she tested positive. States first symptom was HA, the past week she has been coughing and running a fever. The last 2 days vomiting and diarrhea. Legs cramping

## 2020-01-10 NOTE — ED Provider Notes (Signed)
[facility] EMERGENCY DEPARTMENT Provider Note   CSN: [id number] Arrival date & time: 01/10/20  1842     History Chief Complaint  Patient presents with  . Covid Positive    Martha Espinoza is a 54 y.o. female.  HPI 54 year old female presents with vomiting, diarrhea, fever.  She is a very poor historian  She states she tested positive for Covid with a home test.  She states she had a couple recent negative test however as well.  She has been having headache for a couple weeks and has had a cough for at least 1 week and probably more.  She does not feel short of breath and wears 3 L oxygen at night and as needed during the day.  No chest pain.  She is had a persistent fever for about 5 days and yesterday she started developing vomiting and diarrhea despite home medications.  Some abdominal discomfort.   Past Medical History:  Diagnosis Date  . Abnormal CT of thoracic spine 09/12/2018   Noncontrast.  Increased in number and TINY increase in size of tiny, patchy sclerotic T spine lesions compared to 2018 CT.  Hem/onc summer 2020->w/u->suspected benign bone islands->survellance imaging in 6-12 mo planned.  . Allergic rhinitis   . Arthritis   . Asthma   . Bipolar 1 disorder ([place])   . Chronic pain syndrome    chronic low back pain (Pain mgmt= Dr. [name]).  MRI showed no signif disc dz, only showed some facet arthropathy at L4-5, with joint diastasis at L4-5--plan as of 11/07/16 neurosurg eval is bilat facet injections. (chronic LBP+ fibromyalgia)  . Colitis due to Clostridium difficile 2001  . COPD (chronic obstructive pulmonary disease) ([place])    PFTs 04/2016 showed no sign of copd or asthma, plus she actually had worse FEV1 after taking albuterol.  However, as of 06/2018 she has recent hx of improvement in sx's after taking albuterol.  LONG TIME SMOKER, won't quit.  Intolerant to or cannot afford: pulmicort, QVAR, advair, symbicort, flovent, bevespi, and spiriva, anoro,stiolto, and  trelegy. Pulmicort trial per pulm 12/2018  . Depression   . Fibromyalgia syndrome   . GAD (generalized anxiety disorder)   . GERD (gastroesophageal reflux disease)   . Hepatic hemangioma   . Hepatic steatosis 06/2016   Noted incidentally on CT chest 2018 and 2021  . History of Salmonella gastroenteritis   . Hoarseness    chronic: laryngoscopy showed edema of vocal folds and R focal fold leukoplakia->>NEEDS TO QUIT SMOKING! Rpt laryngoscopy 03/17/19 showed the leukoplakia spot smaller->to be followed with serial exams by ent. 06/15/19->MUCH imroved vocal fold edema and vocal cord leukoplakia: less hoarseness with smoking cessation!  . Hyperlipidemia    a. Noted 02/2014.  Marland Kitchen Hypertension   . IBS (irritable bowel syndrome)   . Iron deficiency anemia 05/2014   Per pt it is not due to vaginal blood loss; hemoccults sent to pt in mail 413/16.  . Lumbar spondylosis    Mild, mainly focused at L4-5 and L5-S1.  MRI L spine 09/09/17-->mild degenerative disc disease and facet arthrosis without any nerve root encroachment, no spinal stenosis.  . Microscopic hematuria    Intermittent (no w/u done yet, as of 03/2014)  . Migraine headache   . Morbid obesity ([place])   . Multiple lipomas 09/2016   Left upper arm--very small ones.  . Nocturnal hypoxia 09/18/2017   Overnight oximetry -->qualified: ordered 2L oxygen during sleep.  . Obesity hypoventilation syndrome ([place])   . Pain in  both lower legs 2018-2019   Pain and numbness bilat LLs.  NCS/EMGs findings suggestive of S1 radiculopathy bilat: sx's + NCS findings suggestive of neurogenic claudication/spinal stenosis.  ? contribution of venous reflux?-->"idiopathic peripheral neuropathy, progressive->neurol/pain mgmt  . Peripheral edema Fall 2018   R>L, non-pitting for the most part--venous doppler neg for DVT 01/2017.  Saw Dr. Bridgett Larsson (Vasc) 04/12/17 and u/s showed some venous reflux dz but he felt her sx's in legs were NOT due to her venous insufficiency OR PAD.   Thigh high comp stockings rx'd.  . Polycystic ovarian syndrome   . Pruritic dermatitis 2015/2016   +Scabies prep at [facility] 07/2014. (Primarily pruritic skin, but eventually a subtle rash as well)  . PTSD (post-traumatic stress disorder)   . Raynaud's phenomenon 2019  . Rectal cancer ([place])    a. Followed by Dr. Gala Romney, dx 2000-2001.  Surveillance colonoscopy planned as of 11/2018 GI f/u.  . Right shoulder pain 2017   Murphy/Wainer: RC bursitis + RC tear (MRI)--injection trial is plan per pt report 05/04/16.  . Vitamin D deficiency   . Weakness of both lower extremities 2020-2021   Dr. Merlene Laughter primary neurol, second opinion with [place] neuro Dr. Krista Blue 07/2019--w/u unrevealing of cause  . Wheezing    Most of her resp symptoms are c/w upper airway etiology (repeated PFTs ->no obstructive dz. CT chest 2021 w/some air-trapping/small airways dz, though). Pulm referred her to ENT 12/2018.    Patient Active Problem List   Diagnosis Date Noted  . Low back pain with sciatica 08/13/2019  . Gait abnormality 08/13/2019  . Weakness 08/13/2019  . Functional gait abnormality 06/29/2019  . Shoulder pain 06/18/2019  . Primary fibromyalgia syndrome 06/18/2019  . Pain in lower limb 06/18/2019  . Idiopathic peripheral neuropathy 06/18/2019  . Long term (current) use of opiate analgesic 06/18/2019  . Depression with anxiety 01/30/2019  . PTSD (post-traumatic stress disorder) 01/30/2019  . Bone lesion 09/25/2018  . Nocturnal hypoxia 10/04/2017  . Leg pain, bilateral 07/15/2017  . Chronic venous insufficiency 04/12/2017  . Acute respiratory failure with hypoxia ([place]) 10/18/2015  . COPD exacerbation ([place]) 10/18/2015  . Drug reaction 07/23/2015  . Fever of unknown origin 07/22/2015  . Lactic acidosis 07/22/2015  . Hypokalemia 07/22/2015  . Nausea and vomiting 07/22/2015  . Chronic back pain 07/22/2015  . Abdominal pain 07/26/2014  . Nausea without vomiting 07/26/2014  . History of colon cancer  07/26/2014  . Iron deficiency anemia 07/07/2014  . Vitamin D deficiency 07/07/2014  . Abnormal nuclear stress test 03/31/2014  . Abnormal cardiac CT angiography 03/31/2014  . Hyperlipidemia 03/24/2014  . Morbid obesity ([place])   . Chest pain of uncertain etiology 35/36/1443  . COPD (chronic obstructive pulmonary disease) ([place]) 03/23/2014  . Leukocytosis 03/23/2014  . Hyperglycemia 03/23/2014  . Bipolar 1 disorder ([place]) 03/23/2014  . Pruritic rash 03/23/2014  . Chronic anxiety 03/23/2014  . Tobacco abuse 03/23/2014  . Intractable migraine without aura and with status migrainosus 01/14/2014  . Sinusitis 07/02/2013  . Urinary frequency 07/02/2013  . Migraine 07/02/2013  . Anxiety state, unspecified 04/14/2013  . Obesity 04/14/2013  . Anemia 04/14/2013  . CONSTIPATION 10/12/2009  . CARCINOMA IN SITU OF RECTUM 08/25/2009  . GERD 08/25/2009  . IRRITABLE BOWEL SYNDROME 08/25/2009  . DIARRHEA, CHRONIC 08/25/2009  . HEMANGIOMA, HEPATIC 08/24/2009  . POLYCYSTIC OVARIAN DISEASE 08/24/2009  . Personal history of other diseases of digestive system 08/24/2009    Past Surgical History:  Procedure Laterality Date  .  ABI's complete Bilateral 09/30/2017   Normal ABIs.  Great toe pressures adequate for wound healing.  Femoral artery waveforms triphasic (normal).  . CARDIOVASCULAR STRESS TEST  03/24/14   Lexscan MIBI: mild anterior ischemia?  Cardiac CT angiogram recommended/done.  . CHOLECYSTECTOMY    . COLONOSCOPY  05/05/2007   adenoma  . coronary CT angio  03/29/14   Two vessel dz/moderate stenosis of mid LAD and proximal RCA; cath recommended.  Marland Kitchen DILATION AND CURETTAGE OF UTERUS     for vaginal bleeding  . EVENT MONITOR  04/2019   No signif abnormality (Dr. Domenic Polite)  . JOINT REPLACEMENT    . laryngoscopy  02/11/2019   (Done b/c of hoarseness and hx of wheezing"Reinke's edema of vocal folds, right vocal cord leukoplakia-->plan for bx of leukoplakia->NEEDS TO QUIT SMOKING!  . LEFT HEART  CATHETERIZATION WITH CORONARY ANGIOGRAM N/A 03/31/2014   Normal coronaries, EF 92-42%, diastolic dysfunction.  Procedure: LEFT HEART CATHETERIZATION WITH CORONARY ANGIOGRAM;  Surgeon: Sinclair Grooms, MD;  Location: [facility] CATH LAB;  Service: Cardiovascular;  Laterality: N/A;  . LOWER EXT VENOUS DOPPLERS Bilateral 02/08/2017   NEG for DVT (bilat)  . NCS/EMG  10/03/2017   S1 radiculopathy bilat  . Overnight oximetry testing  09/2017   qualified for oxygen supplementation during sleep  . PFTs  04/2016   No sign of COPD or asthma; FEV 1 worse after albut.  . rectal mss  age 74   High-grade rectal adenoma removed from rectum   . TRANSTHORACIC ECHOCARDIOGRAM  03/23/14; 10/21/17; 05/12/19   2015 Normal.  2019: mild LVH, EF 65-70%, study technically inadequate to assess diastolic function, valves were fine, wall motion normal. 2021 EF 60-65%-->all normal.     OB History    Gravida  4   Para  1   Term  1   Preterm      AB  3   Living  1     SAB  3   TAB      Ectopic      Multiple      Live Births              Family History  Problem Relation Age of Onset  . Colon cancer Father 78  . Cancer Father   . Heart disease Mother   . Hypertension Mother   . Hyperlipidemia Mother   . Mental illness Mother   . Diabetes Mother   . Heart attack Mother   . Thyroid disease Mother   . Anxiety disorder Mother   . Depression Mother   . Colon cancer Maternal Grandfather   . Bipolar disorder Son   . Stroke Neg Hx     Social History   Tobacco Use  . Smoking status: Former Smoker    Packs/day: 1.00    Years: 18.00    Pack years: 18.00    Types: Cigarettes  . Smokeless tobacco: Never Used  . Tobacco comment: 25 years  Vaping Use  . Vaping Use: Never used  Substance Use Topics  . Alcohol use: No    Alcohol/week: 0.0 standard drinks  . Drug use: No    Home Medications Prior to Admission medications   Medication Sig Start Date End Date Taking? Authorizing Provider    albuterol (PROVENTIL) (2.5 MG/3ML) 0.083% nebulizer solution Take 3 mLs (2.5 mg total) by nebulization every 6 (six) hours as needed for wheezing or shortness of breath. 08/27/18 10/16/19  Tammi Sou, MD  albuterol (VENTOLIN HFA) 108 (90  Base) MCG/ACT inhaler INHALE 1 TO 2 PUFFS BY MOUTH EVERY 4 HOURS AS NEEDED 01/08/20   Mannam, Hart Robinsons, MD  aspirin 81 MG chewable tablet Chew 1 tablet (81 mg total) by mouth daily. Patient not taking: Reported on 10/16/2019 09/06/12   Ripley Fraise, MD  azelastine (ASTELIN) 0.1 % nasal spray Place 2 sprays into both nostrils 2 (two) times daily. 07/26/17   McGowen, Adrian Blackwater, MD  busPIRone (BUSPAR) 15 MG tablet Take 1 tablet (15 mg total) by mouth 2 (two) times daily. 10/19/19   [place] Crane, MD  clonazePAM (KLONOPIN) 0.5 MG tablet TAKE ONE TABLET BY MOUTH DURING THE DAY AS NEEDED AND 2 TABLETS AT BEDTIME 12/09/19   McGowen, Adrian Blackwater, MD  cyclobenzaprine (FLEXERIL) 10 MG tablet TAKE ONE TABLET BY MOUTH EVERY 6 HOURS AS NEEDED (SPASM RELATED TO HEADACHES) 07/10/16   McGowen, Adrian Blackwater, MD  EQ ALLERGY RELIEF, CETIRIZINE, 10 MG tablet Take 1 tablet by mouth once daily 10/08/19   McGowen, Adrian Blackwater, MD  fluticasone (FLONASE) 50 MCG/ACT nasal spray Place 2 sprays into both nostrils daily as needed for allergies.     [provider]  lamoTRIgine (LAMICTAL) 100 MG tablet Take 2 tablets at bedtime 12/17/19   [place] Crane, MD  losartan (COZAAR) 50 MG tablet Take 1 tablet by mouth once daily Patient not taking: Reported on 10/16/2019 07/01/19   Tammi Sou, MD  meloxicam (MOBIC) 15 MG tablet Take 15 mg by mouth every evening.  01/01/18   [provider]  metoCLOPramide (REGLAN) 10 MG tablet Take 1 tablet (10 mg total) by mouth every 8 (eight) hours as needed for nausea or vomiting. 01/10/20   Sherwood Gambler, MD  metoprolol tartrate (LOPRESSOR) 50 MG tablet Take 1 tablet by mouth twice daily 01/07/20   McGowen, Adrian Blackwater, MD  Naldemedine Tosylate (SYMPROIC)  0.2 MG TABS Take 0.2 mg by mouth at bedtime.     [provider]  nitroGLYCERIN (NITROSTAT) 0.4 MG SL tablet DISSOLVE ONE TABLET UNDER THE TONGUE EVERY 5 MINUTES AS NEEDED FOR CHEST PAIN.  DO NOT EXCEED A TOTAL OF 3 DOSES IN 15 MINUTES 11/13/18   McGowen, Adrian Blackwater, MD  oxyCODONE-acetaminophen (PERCOCET) 10-325 MG tablet Take 1 tablet by mouth in the morning, at noon, and at bedtime.     [provider]  pantoprazole (PROTONIX) 40 MG tablet Take 1 tablet (40 mg total) by mouth daily. 10/16/19   McGowen, Adrian Blackwater, MD  Potassium (POTASSIMIN PO) Take 2 tablets by mouth at bedtime.    [provider]  potassium chloride SA (KLOR-CON) 20 MEQ tablet Take 1 tablet (20 mEq total) by mouth daily. 01/10/20   Sherwood Gambler, MD  pregabalin (LYRICA) 200 MG capsule Take 200 mg by mouth daily.  01/28/19   [provider]  promethazine (PHENERGAN) 25 MG suppository Place 1 suppository (25 mg total) rectally every 6 (six) hours as needed for nausea or vomiting. 01/10/20   Sherwood Gambler, MD  promethazine (PHENERGAN) 25 MG tablet TAKE 1 TABLET BY MOUTH EVERY 6 HOURS AS NEEDED FOR NAUSEA 10/16/19   McGowen, Adrian Blackwater, MD  rOPINIRole (REQUIP) 0.25 MG tablet TAKE 3 TABLETS BY MOUTH AT BEDTIME 01/07/20   McGowen, Adrian Blackwater, MD  rosuvastatin (CRESTOR) 20 MG tablet Take 1 tablet by mouth once daily 11/05/19   McGowen, Adrian Blackwater, MD  sertraline (ZOLOFT) 100 MG tablet Take 1 tablet by mouth once daily 01/07/20   [place] Crane, MD  topiramate (TOPAMAX) 50 MG tablet  Take 50 mg by mouth as needed.  Patient not taking: Reported on 09/07/2019 06/29/19   [provider]  traZODone (DESYREL) 150 MG tablet Take 1 tablet (150 mg total) by mouth at bedtime. 12/17/19   [place]ev[place] Crane, MD  citalopram (CELEXA) 40 MG tablet Take 20 mg by mouth at bedtime.   06/18/11  [provider]  omeprazole (PRILOSEC) 20 MG capsule Take 40 mg by mouth daily.  05/20/19  [provider]    Allergies      Contrast media [iodinated diagnostic agents], Shellfish allergy, Codeine, Furosemide, Anoro ellipta [umeclidinium-vilanterol], Flovent diskus [fluticasone propionate (inhal)], Ivermectin, Other, Spiriva handihaler [tiotropium bromide monohydrate], Symbicort [budesonide-formoterol fumarate], Zofran [ondansetron], Betadine [povidone iodine], Latex, Lipitor [atorvastatin], Permethrin, Stiolto respimat [tiotropium bromide-olodaterol], Tape, and Trelegy ellipta [fluticasone-umeclidin-vilant]  Review of Systems   Review of Systems  Constitutional: Positive for fever.  Respiratory: Positive for cough. Negative for shortness of breath.   Cardiovascular: Negative for chest pain.  Gastrointestinal: Positive for abdominal pain, diarrhea and vomiting.  Neurological: Positive for headaches.  All other systems reviewed and are negative.   Physical Exam Updated Vital Signs BP (!) 106/55 (BP Location: Right Arm)   Pulse 83   Temp 98.9 F (37.2 C) (Oral)   Resp (!) 25   Ht 5' (1.524 m)   Wt 108.9 kg   SpO2 94%   BMI 46.87 kg/m   Physical Exam Vitals and nursing note reviewed.  Constitutional:      General: She is not in acute distress.    Appearance: She is well-developed. She is obese. She is not ill-appearing or diaphoretic.  HENT:     Head: Normocephalic and atraumatic.     Right Ear: External ear normal.     Left Ear: External ear normal.     Nose: Nose normal.  Eyes:     General:        Right eye: No discharge.        Left eye: No discharge.  Cardiovascular:     Rate and Rhythm: Regular rhythm. Tachycardia present.     Heart sounds: Normal heart sounds.  Pulmonary:     Effort: Pulmonary effort is normal.     Breath sounds: Normal breath sounds. No wheezing.  Abdominal:     General: There is no distension.     Palpations: Abdomen is soft.     Tenderness: There is no abdominal tenderness.  Skin:    General: Skin is warm and dry.  Neurological:     Mental Status: She is alert.   Psychiatric:        Mood and Affect: Mood is not anxious.     ED Results / Procedures / Treatments   Labs (all labs ordered are listed, but only abnormal results are displayed) Labs Reviewed  RESPIRATORY PANEL BY RT PCR (FLU A&B, COVID) - Abnormal; Notable for the following components:      Result Value   SARS Coronavirus 2 by RT PCR POSITIVE (*)    All other components within normal limits  COMPREHENSIVE METABOLIC PANEL - Abnormal; Notable for the following components:   Sodium 133 (*)    Potassium 3.2 (*)    Chloride 97 (*)    Glucose, Bld 125 (*)    Calcium 8.2 (*)    All other components within normal limits  CBC WITH DIFFERENTIAL/PLATELET - Abnormal; Notable for the following components:   WBC 2.3 (*)    Platelets 105 (*)    Lymphs Abs 0.5 (*)  All other components within normal limits  I-STAT BETA HCG BLOOD, ED ([facility], [facility], [facility] ONLY)    EKG EKG Interpretation  Date/Time:  Sunday January 10 2020 19:39:25 EDT Ventricular Rate:  106 PR Interval:    QRS Duration: 105 QT Interval:  328 QTC Calculation: 436 R Axis:   74 Text Interpretation: Age not entered, assumed to be  54 years old for purpose of ECG interpretation Sinus tachycardia nonspecific ST/T changes Confirmed by Sherwood Gambler [phone]) on 01/10/2020 9:36:18 PM   Radiology DG Chest Portable 1 View  Result Date: 01/10/2020 CLINICAL DATA:  Cough, fever EXAM: PORTABLE CHEST 1 VIEW COMPARISON:  07/07/2019 FINDINGS: Heart is normal size. Patchy bilateral airspace opacities. No effusions or pneumothorax. No acute bony abnormality. IMPRESSION: Patchy bilateral airspace disease concerning for pneumonia. Electronically Signed   By: Rolm Baptise M.D.   On: 01/10/2020 20:35    Procedures Procedures (including critical care time)  Medications Ordered in ED Medications  acetaminophen (TYLENOL) tablet 1,000 mg (1,000 mg Oral Given 01/10/20 2058)  sodium chloride 0.9 % bolus 1,000 mL (0 mLs Intravenous Stopped 01/10/20  2324)  promethazine (PHENERGAN) injection 25 mg (25 mg Intravenous Given 01/10/20 2059)  potassium chloride SA (KLOR-CON) CR tablet 40 mEq (40 mEq Oral Given 01/10/20 2058)  metoCLOPramide (REGLAN) injection 10 mg (10 mg Intramuscular Given 01/10/20 2207)  alum & mag hydroxide-simeth (MAALOX/MYLANTA) 200-200-20 MG/5ML suspension 30 mL (30 mLs Oral Given 01/10/20 2207)    And  lidocaine (XYLOCAINE) 2 % viscous mouth solution 15 mL (15 mLs Oral Given 01/10/20 2207)    ED Course  I have reviewed the triage vital signs and the nursing notes.  Pertinent labs & imaging results that were available during my care of the patient were reviewed by me and considered in my medical decision making (see chart for details).    MDM Rules/Calculators/A&P                          Patient is not having any shortness of breath or chest pain.  Her ECG is showing some nonspecific changes but with no chest pain my suspicion of ACS is very low.  She does have labs do reflect Covid including leukopenia and of course her Covid test is positive.  She is not having any vomiting here.  She did spit back up the GI cocktail but was able to tolerate everything else.  At this point, she has been given IV fluids and potassium which she also tolerated.  Abdominal exam is benign.  She is not really a candidate for monoclonal antibodies as based on her history she has been having symptoms for over 2 weeks.  She does appear stable for discharge home.  She is chronically on oxygen and is not at an increased level at this time.  Discharged with return precautions.  Martha Espinoza was evaluated in Emergency Department on 01/10/2020 for the symptoms described in the history of present illness. She was evaluated in the context of the global COVID-19 pandemic, which necessitated consideration that the patient might be at risk for infection with the SARS-CoV-2 virus that causes COVID-19. Institutional protocols and algorithms that pertain  to the evaluation of patients at risk for COVID-19 are in a state of rapid change based on information released by regulatory bodies including the CDC and federal and state organizations. These policies and algorithms were followed during the patient's care in the ED.  Final Clinical Impression(s) / ED  Diagnoses Final diagnoses:  COVID-19  Vomiting and diarrhea    Rx / DC Orders ED Discharge Orders         Ordered    metoCLOPramide (REGLAN) 10 MG tablet  Every 8 hours PRN        01/10/20 2342    promethazine (PHENERGAN) 25 MG suppository  Every 6 hours PRN        01/10/20 2342    potassium chloride SA (KLOR-CON) 20 MEQ tablet  Daily        01/10/20 2342           Sherwood Gambler, MD 01/10/20 2359

## 2020-01-10 NOTE — ED Notes (Signed)
Date and time results received: 01/10/20 2114  Test: COVID Critical Value: positive  Name of Provider Notified: Regenia Skeeter, MD  Orders Received? Or Actions Taken?: acknowledged

## 2020-01-11 ENCOUNTER — Other Ambulatory Visit: Payer: Self-pay | Admitting: Pulmonary Disease

## 2020-01-11 NOTE — ED Notes (Signed)
PT educated with DC instructions and verbalized complete understanding denies questions at this time. Pt IV removed fully intact and dressing applied. Pt to exit via wheelchair to be transported by family.

## 2020-01-12 ENCOUNTER — Inpatient Hospital Stay (HOSPITAL_COMMUNITY): Payer: Commercial Managed Care - PPO | Admitting: Hematology

## 2020-01-13 ENCOUNTER — Inpatient Hospital Stay (HOSPITAL_COMMUNITY): Payer: Commercial Managed Care - PPO

## 2020-01-13 ENCOUNTER — Inpatient Hospital Stay (HOSPITAL_COMMUNITY)
Admission: EM | Admit: 2020-01-13 | Discharge: 2020-01-25 | DRG: 207 | Disposition: E | Payer: Commercial Managed Care - PPO | Attending: Student | Admitting: Student

## 2020-01-13 ENCOUNTER — Other Ambulatory Visit: Payer: Self-pay

## 2020-01-13 ENCOUNTER — Ambulatory Visit (HOSPITAL_COMMUNITY): Payer: Commercial Managed Care - PPO | Admitting: Licensed Clinical Social Worker

## 2020-01-13 ENCOUNTER — Emergency Department (HOSPITAL_COMMUNITY): Payer: Commercial Managed Care - PPO

## 2020-01-13 ENCOUNTER — Encounter (HOSPITAL_COMMUNITY): Payer: Self-pay

## 2020-01-13 DIAGNOSIS — K760 Fatty (change of) liver, not elsewhere classified: Secondary | ICD-10-CM | POA: Diagnosis present

## 2020-01-13 DIAGNOSIS — Z833 Family history of diabetes mellitus: Secondary | ICD-10-CM

## 2020-01-13 DIAGNOSIS — R0603 Acute respiratory distress: Secondary | ICD-10-CM

## 2020-01-13 DIAGNOSIS — F419 Anxiety disorder, unspecified: Secondary | ICD-10-CM | POA: Diagnosis present

## 2020-01-13 DIAGNOSIS — Z91040 Latex allergy status: Secondary | ICD-10-CM

## 2020-01-13 DIAGNOSIS — Z800 Family history of malignant neoplasm of digestive organs: Secondary | ICD-10-CM

## 2020-01-13 DIAGNOSIS — Z9689 Presence of other specified functional implants: Secondary | ICD-10-CM

## 2020-01-13 DIAGNOSIS — A4152 Sepsis due to Pseudomonas: Secondary | ICD-10-CM | POA: Diagnosis not present

## 2020-01-13 DIAGNOSIS — J95851 Ventilator associated pneumonia: Secondary | ICD-10-CM | POA: Diagnosis not present

## 2020-01-13 DIAGNOSIS — J9601 Acute respiratory failure with hypoxia: Secondary | ICD-10-CM

## 2020-01-13 DIAGNOSIS — D696 Thrombocytopenia, unspecified: Secondary | ICD-10-CM | POA: Diagnosis present

## 2020-01-13 DIAGNOSIS — G8929 Other chronic pain: Secondary | ICD-10-CM | POA: Diagnosis present

## 2020-01-13 DIAGNOSIS — Z818 Family history of other mental and behavioral disorders: Secondary | ICD-10-CM

## 2020-01-13 DIAGNOSIS — F411 Generalized anxiety disorder: Secondary | ICD-10-CM | POA: Diagnosis present

## 2020-01-13 DIAGNOSIS — Z66 Do not resuscitate: Secondary | ICD-10-CM | POA: Diagnosis present

## 2020-01-13 DIAGNOSIS — E878 Other disorders of electrolyte and fluid balance, not elsewhere classified: Secondary | ICD-10-CM | POA: Diagnosis present

## 2020-01-13 DIAGNOSIS — K219 Gastro-esophageal reflux disease without esophagitis: Secondary | ICD-10-CM | POA: Diagnosis present

## 2020-01-13 DIAGNOSIS — E662 Morbid (severe) obesity with alveolar hypoventilation: Secondary | ICD-10-CM | POA: Diagnosis present

## 2020-01-13 DIAGNOSIS — J15211 Pneumonia due to Methicillin susceptible Staphylococcus aureus: Secondary | ICD-10-CM | POA: Diagnosis present

## 2020-01-13 DIAGNOSIS — J930 Spontaneous tension pneumothorax: Secondary | ICD-10-CM | POA: Diagnosis not present

## 2020-01-13 DIAGNOSIS — Z83438 Family history of other disorder of lipoprotein metabolism and other lipidemia: Secondary | ICD-10-CM

## 2020-01-13 DIAGNOSIS — F319 Bipolar disorder, unspecified: Secondary | ICD-10-CM | POA: Diagnosis present

## 2020-01-13 DIAGNOSIS — Z791 Long term (current) use of non-steroidal anti-inflammatories (NSAID): Secondary | ICD-10-CM

## 2020-01-13 DIAGNOSIS — E876 Hypokalemia: Secondary | ICD-10-CM | POA: Diagnosis present

## 2020-01-13 DIAGNOSIS — J9600 Acute respiratory failure, unspecified whether with hypoxia or hypercapnia: Secondary | ICD-10-CM | POA: Diagnosis not present

## 2020-01-13 DIAGNOSIS — G894 Chronic pain syndrome: Secondary | ICD-10-CM

## 2020-01-13 DIAGNOSIS — U071 COVID-19: Secondary | ICD-10-CM | POA: Diagnosis present

## 2020-01-13 DIAGNOSIS — R6521 Severe sepsis with septic shock: Secondary | ICD-10-CM | POA: Diagnosis present

## 2020-01-13 DIAGNOSIS — R578 Other shock: Secondary | ICD-10-CM | POA: Diagnosis not present

## 2020-01-13 DIAGNOSIS — Z9049 Acquired absence of other specified parts of digestive tract: Secondary | ICD-10-CM

## 2020-01-13 DIAGNOSIS — J1282 Pneumonia due to coronavirus disease 2019: Secondary | ICD-10-CM | POA: Diagnosis present

## 2020-01-13 DIAGNOSIS — J9383 Other pneumothorax: Secondary | ICD-10-CM

## 2020-01-13 DIAGNOSIS — I10 Essential (primary) hypertension: Secondary | ICD-10-CM | POA: Diagnosis present

## 2020-01-13 DIAGNOSIS — A4101 Sepsis due to Methicillin susceptible Staphylococcus aureus: Secondary | ICD-10-CM | POA: Diagnosis not present

## 2020-01-13 DIAGNOSIS — Z7189 Other specified counseling: Secondary | ICD-10-CM | POA: Diagnosis not present

## 2020-01-13 DIAGNOSIS — Z515 Encounter for palliative care: Secondary | ICD-10-CM | POA: Diagnosis not present

## 2020-01-13 DIAGNOSIS — J80 Acute respiratory distress syndrome: Secondary | ICD-10-CM | POA: Diagnosis present

## 2020-01-13 DIAGNOSIS — Z7982 Long term (current) use of aspirin: Secondary | ICD-10-CM

## 2020-01-13 DIAGNOSIS — Z8349 Family history of other endocrine, nutritional and metabolic diseases: Secondary | ICD-10-CM

## 2020-01-13 DIAGNOSIS — J440 Chronic obstructive pulmonary disease with acute lower respiratory infection: Secondary | ICD-10-CM | POA: Diagnosis present

## 2020-01-13 DIAGNOSIS — Z85048 Personal history of other malignant neoplasm of rectum, rectosigmoid junction, and anus: Secondary | ICD-10-CM

## 2020-01-13 DIAGNOSIS — Z8249 Family history of ischemic heart disease and other diseases of the circulatory system: Secondary | ICD-10-CM

## 2020-01-13 DIAGNOSIS — Z978 Presence of other specified devices: Secondary | ICD-10-CM

## 2020-01-13 DIAGNOSIS — G609 Hereditary and idiopathic neuropathy, unspecified: Secondary | ICD-10-CM | POA: Diagnosis present

## 2020-01-13 DIAGNOSIS — Y848 Other medical procedures as the cause of abnormal reaction of the patient, or of later complication, without mention of misadventure at the time of the procedure: Secondary | ICD-10-CM | POA: Diagnosis not present

## 2020-01-13 DIAGNOSIS — Z6841 Body Mass Index (BMI) 40.0 and over, adult: Secondary | ICD-10-CM | POA: Diagnosis not present

## 2020-01-13 DIAGNOSIS — Z91041 Radiographic dye allergy status: Secondary | ICD-10-CM

## 2020-01-13 DIAGNOSIS — E785 Hyperlipidemia, unspecified: Secondary | ICD-10-CM | POA: Diagnosis present

## 2020-01-13 DIAGNOSIS — J939 Pneumothorax, unspecified: Secondary | ICD-10-CM

## 2020-01-13 DIAGNOSIS — M797 Fibromyalgia: Secondary | ICD-10-CM | POA: Diagnosis present

## 2020-01-13 DIAGNOSIS — Z91013 Allergy to seafood: Secondary | ICD-10-CM

## 2020-01-13 DIAGNOSIS — Z79899 Other long term (current) drug therapy: Secondary | ICD-10-CM

## 2020-01-13 DIAGNOSIS — Z87891 Personal history of nicotine dependence: Secondary | ICD-10-CM | POA: Diagnosis not present

## 2020-01-13 DIAGNOSIS — J151 Pneumonia due to Pseudomonas: Secondary | ICD-10-CM | POA: Diagnosis present

## 2020-01-13 DIAGNOSIS — Z9109 Other allergy status, other than to drugs and biological substances: Secondary | ICD-10-CM

## 2020-01-13 LAB — COMPREHENSIVE METABOLIC PANEL
ALT: 20 U/L (ref 0–44)
ALT: 17 U/L (ref 0–44)
AST: 53 U/L — ABNORMAL HIGH (ref 15–41)
AST: 46 U/L — ABNORMAL HIGH (ref 15–41)
Albumin: 3.4 g/dL — ABNORMAL LOW (ref 3.5–5.0)
Albumin: 3.2 g/dL — ABNORMAL LOW (ref 3.5–5.0)
Alkaline Phosphatase: 54 U/L (ref 38–126)
Alkaline Phosphatase: 54 U/L (ref 38–126)
Anion gap: 16 — ABNORMAL HIGH (ref 5–15)
Anion gap: 10 (ref 5–15)
BUN: 14 mg/dL (ref 6–20)
BUN: 13 mg/dL (ref 6–20)
CO2: 21 mmol/L — ABNORMAL LOW (ref 22–32)
CO2: 23 mmol/L (ref 22–32)
Calcium: 8.1 mg/dL — ABNORMAL LOW (ref 8.9–10.3)
Calcium: 8.1 mg/dL — ABNORMAL LOW (ref 8.9–10.3)
Chloride: 99 mmol/L (ref 98–111)
Chloride: 107 mmol/L (ref 98–111)
Creatinine, Ser: 0.79 mg/dL (ref 0.44–1.00)
Creatinine, Ser: 0.54 mg/dL (ref 0.44–1.00)
GFR, Estimated: >60 mL/min (ref >60)
GFR, Estimated: >60 mL/min (ref >60)
Glucose, Bld: 142 mg/dL — ABNORMAL HIGH (ref 70–99)
Glucose, Bld: 154 mg/dL — ABNORMAL HIGH (ref 70–99)
Potassium: 2.7 mmol/L — CL (ref 3.5–5.1)
Potassium: 3.4 mmol/L — ABNORMAL LOW (ref 3.5–5.1)
Sodium: 136 mmol/L (ref 135–145)
Sodium: 140 mmol/L (ref 135–145)
Total Bilirubin: 1.1 mg/dL (ref 0.3–1.2)
Total Bilirubin: 0.9 mg/dL (ref 0.3–1.2)
Total Protein: 6.8 g/dL (ref 6.5–8.1)
Total Protein: 6.3 g/dL — ABNORMAL LOW (ref 6.5–8.1)

## 2020-01-13 LAB — CBC WITH DIFFERENTIAL/PLATELET
Band Neutrophils: 2 %
Basophils Absolute: 0 10*3/uL (ref 0.0–0.1)
Basophils Relative: 0 %
Eosinophils Absolute: 0 10*3/uL (ref 0.0–0.5)
Eosinophils Relative: 0 %
HCT: 40.1 % (ref 36.0–46.0)
Hemoglobin: 13.5 g/dL (ref 12.0–15.0)
Lymphocytes Relative: 14 %
Lymphs Abs: 0.4 10*3/uL — ABNORMAL LOW (ref 0.7–4.0)
MCH: 29.7 pg (ref 26.0–34.0)
MCHC: 33.7 g/dL (ref 30.0–36.0)
MCV: 88.3 fL (ref 80.0–100.0)
Metamyelocytes Relative: 5 %
Monocytes Absolute: 0 10*3/uL — ABNORMAL LOW (ref 0.1–1.0)
Monocytes Relative: 1 %
Neutro Abs: 2 10*3/uL (ref 1.7–7.7)
Neutrophils Relative %: 75 %
Other: 3 %
Platelets: 120 10*3/uL — ABNORMAL LOW (ref 150–400)
RBC: 4.54 MIL/uL (ref 3.87–5.11)
RDW: 14.6 % (ref 11.5–15.5)
WBC: 2.6 10*3/uL — ABNORMAL LOW (ref 4.0–10.5)
nRBC: 0 % (ref 0.0–0.2)

## 2020-01-13 LAB — BLOOD GAS, ARTERIAL
Acid-base deficit: 3.5 mmol/L — ABNORMAL HIGH (ref 0.0–2.0)
Bicarbonate: 24.2 mmol/L (ref 20.0–28.0)
Drawn by: 225631
FIO2: 100
MECHVT: 400 mL
O2 Saturation: 86.1 %
PEEP: 14 cmH2O
Patient temperature: 99.9
RATE: 32 resp/min
pCO2 arterial: 60.1 mmHg — ABNORMAL HIGH (ref 32.0–48.0)
pH, Arterial: 7.234 — ABNORMAL LOW (ref 7.350–7.450)
pO2, Arterial: 61.5 mmHg — ABNORMAL LOW (ref 83.0–108.0)

## 2020-01-13 LAB — MRSA PCR SCREENING: MRSA by PCR: NEGATIVE

## 2020-01-13 LAB — TRIGLYCERIDES: Triglycerides: 314 mg/dL — ABNORMAL HIGH (ref <150)

## 2020-01-13 LAB — D-DIMER, QUANTITATIVE: D-Dimer, Quant: 2.23 ug/mL-FEU — ABNORMAL HIGH (ref 0.00–0.50)

## 2020-01-13 LAB — LACTATE DEHYDROGENASE: LDH: 643 U/L — ABNORMAL HIGH (ref 98–192)

## 2020-01-13 LAB — C-REACTIVE PROTEIN: CRP: 18.6 mg/dL — ABNORMAL HIGH (ref <1.0)

## 2020-01-13 LAB — FIBRINOGEN: Fibrinogen: 595 mg/dL — ABNORMAL HIGH (ref 210–475)

## 2020-01-13 LAB — FERRITIN: Ferritin: 2560 ng/mL — ABNORMAL HIGH (ref 11–307)

## 2020-01-13 LAB — PROCALCITONIN: Procalcitonin: 0.25 ng/mL

## 2020-01-13 LAB — LACTIC ACID, PLASMA
Lactic Acid, Venous: 2.6 mmol/L (ref 0.5–1.9)
Lactic Acid, Venous: 1.4 mmol/L (ref 0.5–1.9)

## 2020-01-13 LAB — GLUCOSE, CAPILLARY: Glucose-Capillary: 133 mg/dL — ABNORMAL HIGH (ref 70–99)

## 2020-01-13 MED ORDER — ASPIRIN 81 MG PO CHEW: 81.0000 mg | CHEWABLE_TABLET | Freq: Every day | ORAL | Status: DC

## 2020-01-13 MED ORDER — TRAZODONE HCL 50 MG PO TABS: 150.0000 mg | ORAL_TABLET | Freq: Every day | ORAL | Status: DC

## 2020-01-13 MED ORDER — BUSPIRONE HCL 5 MG PO TABS: 15.0000 mg | ORAL_TABLET | Freq: Two times a day (BID) | ORAL | Status: DC

## 2020-01-13 MED ORDER — DEXAMETHASONE SODIUM PHOSPHATE 10 MG/ML IJ SOLN
6.0000 mg | INTRAMUSCULAR | Status: DC
  Administered 2020-01-14 – 2020-01-17 (×4): 6 mg via INTRAVENOUS
  Filled 2020-01-13 (×4): qty 1

## 2020-01-13 MED ORDER — ASCORBIC ACID 500 MG PO TABS
500.0000 mg | ORAL_TABLET | Freq: Every day | ORAL | Status: DC
  Administered 2020-01-14 – 2020-01-17 (×4): 500 mg
  Filled 2020-01-13 (×4): qty 1

## 2020-01-13 MED ORDER — POTASSIUM CHLORIDE 20 MEQ/15ML (10%) PO SOLN
40.0000 meq | Freq: Once | ORAL | Status: AC
  Administered 2020-01-13: 40 meq
  Filled 2020-01-13: qty 30

## 2020-01-13 MED ORDER — MIDAZOLAM HCL 2 MG/2ML IJ SOLN
1.0000 mg | INTRAMUSCULAR | Status: DC | PRN
  Administered 2020-01-13: 1 mg via INTRAVENOUS
  Filled 2020-01-13: qty 2

## 2020-01-13 MED ORDER — SUCCINYLCHOLINE CHLORIDE 20 MG/ML IJ SOLN
150.0000 mg | Freq: Once | INTRAMUSCULAR | Status: AC
  Administered 2020-01-13: 150 mg via INTRAVENOUS

## 2020-01-13 MED ORDER — STERILE WATER FOR INJECTION IV SOLN
INTRAVENOUS | Status: DC
  Filled 2020-01-13: qty 150

## 2020-01-13 MED ORDER — PREGABALIN 100 MG PO CAPS
200.0000 mg | ORAL_CAPSULE | Freq: Every day | ORAL | Status: DC
  Administered 2020-01-14 – 2020-01-17 (×4): 200 mg via ORAL
  Filled 2020-01-13 (×4): qty 2

## 2020-01-13 MED ORDER — PROPOFOL 1000 MG/100ML IV EMUL
INTRAVENOUS | Status: AC
  Filled 2020-01-13: qty 100

## 2020-01-13 MED ORDER — PROPOFOL 1000 MG/100ML IV EMUL
5.0000 ug/kg/min | INTRAVENOUS | Status: DC
  Administered 2020-01-13: 20 ug/kg/min via INTRAVENOUS
  Filled 2020-01-13: qty 100

## 2020-01-13 MED ORDER — POTASSIUM CHLORIDE CRYS ER 20 MEQ PO TBCR
40.0000 meq | EXTENDED_RELEASE_TABLET | Freq: Once | ORAL | Status: DC
  Filled 2020-01-13: qty 2

## 2020-01-13 MED ORDER — ONDANSETRON HCL 4 MG PO TABS: 4.0000 mg | ORAL_TABLET | Freq: Four times a day (QID) | ORAL | Status: DC | PRN

## 2020-01-13 MED ORDER — BUSPIRONE HCL 5 MG PO TABS
15.0000 mg | ORAL_TABLET | Freq: Two times a day (BID) | ORAL | Status: DC
  Administered 2020-01-13 – 2020-01-17 (×9): 15 mg
  Filled 2020-01-13 (×9): qty 1

## 2020-01-13 MED ORDER — ROSUVASTATIN CALCIUM 20 MG PO TABS: 20.0000 mg | ORAL_TABLET | Freq: Every day | ORAL | Status: DC

## 2020-01-13 MED ORDER — NALDEMEDINE TOSYLATE 0.2 MG PO TABS
0.2000 mg | ORAL_TABLET | Freq: Every day | ORAL | Status: DC
  Filled 2020-01-13: qty 1

## 2020-01-13 MED ORDER — FENTANYL BOLUS VIA INFUSION
100.0000 ug | Freq: Once | INTRAVENOUS | Status: AC
  Administered 2020-01-13: 100 ug via INTRAVENOUS
  Filled 2020-01-13: qty 100

## 2020-01-13 MED ORDER — METHYLPREDNISOLONE SODIUM SUCC 125 MG IJ SOLR: 125.0000 mg | Freq: Two times a day (BID) | INTRAMUSCULAR | Status: DC

## 2020-01-13 MED ORDER — SERTRALINE HCL 100 MG PO TABS
100.0000 mg | ORAL_TABLET | Freq: Every day | ORAL | Status: DC
  Administered 2020-01-13 – 2020-01-17 (×5): 100 mg
  Filled 2020-01-13 (×5): qty 1

## 2020-01-13 MED ORDER — HEPARIN SODIUM (PORCINE) 5000 UNIT/ML IJ SOLN: 5000.0000 [IU] | Freq: Three times a day (TID) | INTRAMUSCULAR | Status: DC

## 2020-01-13 MED ORDER — OXYCODONE HCL 5 MG PO TABS: 5.0000 mg | ORAL_TABLET | Freq: Four times a day (QID) | ORAL | Status: DC | PRN

## 2020-01-13 MED ORDER — SODIUM CHLORIDE 0.9 % IV SOLN
100.0000 mg | INTRAVENOUS | Status: AC
  Administered 2020-01-13 (×2): 100 mg via INTRAVENOUS
  Filled 2020-01-13: qty 20

## 2020-01-13 MED ORDER — ZINC SULFATE 220 (50 ZN) MG PO CAPS
220.0000 mg | ORAL_CAPSULE | Freq: Every day | ORAL | Status: DC
  Administered 2020-01-14: 220 mg via ORAL
  Filled 2020-01-13 (×2): qty 1

## 2020-01-13 MED ORDER — PANTOPRAZOLE SODIUM 40 MG IV SOLR
40.0000 mg | INTRAVENOUS | Status: DC
  Administered 2020-01-14: 40 mg via INTRAVENOUS
  Filled 2020-01-13: qty 40

## 2020-01-13 MED ORDER — OXYCODONE-ACETAMINOPHEN 5-325 MG PO TABS: 1.0000 | ORAL_TABLET | Freq: Four times a day (QID) | ORAL | Status: DC | PRN

## 2020-01-13 MED ORDER — ONDANSETRON HCL 4 MG/2ML IJ SOLN: 4.0000 mg | Freq: Four times a day (QID) | INTRAMUSCULAR | Status: DC | PRN

## 2020-01-13 MED ORDER — NALDEMEDINE TOSYLATE 0.2 MG PO TABS: 0.2000 mg | ORAL_TABLET | Freq: Every day | ORAL | Status: DC

## 2020-01-13 MED ORDER — POTASSIUM CHLORIDE 10 MEQ/100ML IV SOLN
10.0000 meq | INTRAVENOUS | Status: AC
  Administered 2020-01-13 (×4): 10 meq via INTRAVENOUS
  Filled 2020-01-13 (×4): qty 100

## 2020-01-13 MED ORDER — CHLORHEXIDINE GLUCONATE 0.12% ORAL RINSE (MEDLINE KIT)
15.0000 mL | Freq: Two times a day (BID) | OROMUCOSAL | Status: DC
  Administered 2020-01-13 – 2020-01-16 (×7): 15 mL via OROMUCOSAL

## 2020-01-13 MED ORDER — MIDAZOLAM 50MG/50ML (1MG/ML) PREMIX INFUSION
0.5000 mg/h | INTRAVENOUS | Status: DC
  Administered 2020-01-13: 5 mg/h via INTRAVENOUS
  Administered 2020-01-13: 4 mg/h via INTRAVENOUS
  Administered 2020-01-14: 5 mg/h via INTRAVENOUS
  Filled 2020-01-13 (×3): qty 50

## 2020-01-13 MED ORDER — METHYLPREDNISOLONE SODIUM SUCC 125 MG IJ SOLR
125.0000 mg | Freq: Once | INTRAMUSCULAR | Status: AC
  Administered 2020-01-13: 125 mg via INTRAVENOUS
  Filled 2020-01-13: qty 2

## 2020-01-13 MED ORDER — MELOXICAM 15 MG PO TABS: 15.0000 mg | ORAL_TABLET | Freq: Every evening | ORAL | Status: DC

## 2020-01-13 MED ORDER — SODIUM BICARBONATE 8.4 % IV SOLN
100.0000 meq | Freq: Once | INTRAVENOUS | Status: AC
  Administered 2020-01-13: 100 meq via INTRAVENOUS
  Filled 2020-01-13: qty 100

## 2020-01-13 MED ORDER — ASCORBIC ACID 500 MG PO TABS: 500.0000 mg | ORAL_TABLET | Freq: Every day | ORAL | Status: DC

## 2020-01-13 MED ORDER — SODIUM CHLORIDE 0.9 % IV SOLN: 100.0000 mg | Freq: Every day | INTRAVENOUS | Status: DC

## 2020-01-13 MED ORDER — BARICITINIB 2 MG PO TABS
4.0000 mg | ORAL_TABLET | Freq: Every day | ORAL | Status: DC
  Administered 2020-01-13: 4 mg via ORAL
  Filled 2020-01-13: qty 2

## 2020-01-13 MED ORDER — SODIUM CHLORIDE 0.9 % IV SOLN: Freq: Once | INTRAVENOUS | Status: AC

## 2020-01-13 MED ORDER — CLONAZEPAM 0.5 MG PO TABS
0.5000 mg | ORAL_TABLET | Freq: Three times a day (TID) | ORAL | Status: DC | PRN
  Administered 2020-01-13: 0.5 mg via ORAL
  Filled 2020-01-13: qty 1

## 2020-01-13 MED ORDER — ROPINIROLE HCL 0.5 MG PO TABS
0.7500 mg | ORAL_TABLET | Freq: Every day | ORAL | Status: DC
  Administered 2020-01-13 – 2020-01-17 (×5): 0.75 mg
  Filled 2020-01-13 (×5): qty 1

## 2020-01-13 MED ORDER — ETOMIDATE 2 MG/ML IV SOLN
20.0000 mg | Freq: Once | INTRAVENOUS | Status: AC
  Administered 2020-01-13: 20 mg via INTRAVENOUS

## 2020-01-13 MED ORDER — CHLORHEXIDINE GLUCONATE CLOTH 2 % EX PADS
6.0000 | MEDICATED_PAD | Freq: Every day | CUTANEOUS | Status: DC
  Administered 2020-01-13 – 2020-01-17 (×5): 6 via TOPICAL

## 2020-01-13 MED ORDER — SODIUM CHLORIDE 0.9 % IV SOLN: 200.0000 mg | Freq: Once | INTRAVENOUS | Status: DC

## 2020-01-13 MED ORDER — ASPIRIN 81 MG PO CHEW
81.0000 mg | CHEWABLE_TABLET | Freq: Every day | ORAL | Status: DC
  Administered 2020-01-13 – 2020-01-17 (×5): 81 mg
  Filled 2020-01-13 (×5): qty 1

## 2020-01-13 MED ORDER — ROPINIROLE HCL 0.5 MG PO TABS
0.7500 mg | ORAL_TABLET | Freq: Every day | ORAL | Status: DC
  Filled 2020-01-13: qty 1

## 2020-01-13 MED ORDER — ORAL CARE MOUTH RINSE
15.0000 mL | OROMUCOSAL | Status: DC
  Administered 2020-01-13 – 2020-01-18 (×40): 15 mL via OROMUCOSAL

## 2020-01-13 MED ORDER — BARICITINIB 2 MG PO TABS
4.0000 mg | ORAL_TABLET | Freq: Every day | ORAL | Status: DC
  Administered 2020-01-14 – 2020-01-17 (×4): 4 mg
  Filled 2020-01-13 (×4): qty 2

## 2020-01-13 MED ORDER — OXYCODONE-ACETAMINOPHEN 10-325 MG PO TABS: 1.0000 | ORAL_TABLET | Freq: Four times a day (QID) | ORAL | Status: DC | PRN

## 2020-01-13 MED ORDER — LAMOTRIGINE 100 MG PO TABS
200.0000 mg | ORAL_TABLET | Freq: Every day | ORAL | Status: DC
  Administered 2020-01-13 – 2020-01-17 (×5): 200 mg
  Filled 2020-01-13 (×5): qty 2

## 2020-01-13 MED ORDER — PANTOPRAZOLE SODIUM 40 MG PO TBEC: 40.0000 mg | DELAYED_RELEASE_TABLET | Freq: Every day | ORAL | Status: DC

## 2020-01-13 MED ORDER — ALBUTEROL SULFATE (2.5 MG/3ML) 0.083% IN NEBU: 2.5000 mg | INHALATION_SOLUTION | Freq: Four times a day (QID) | RESPIRATORY_TRACT | Status: DC

## 2020-01-13 MED ORDER — LAMOTRIGINE 100 MG PO TABS: 200.0000 mg | ORAL_TABLET | Freq: Every day | ORAL | Status: DC

## 2020-01-13 MED ORDER — POTASSIUM CHLORIDE CRYS ER 20 MEQ PO TBCR: 20.0000 meq | EXTENDED_RELEASE_TABLET | Freq: Every day | ORAL | Status: DC

## 2020-01-13 MED ORDER — FENTANYL 2500MCG IN NS 250ML (10MCG/ML) PREMIX INFUSION
0.0000 ug/h | INTRAVENOUS | Status: DC
  Administered 2020-01-13: 25 ug/h via INTRAVENOUS
  Administered 2020-01-14: 200 ug/h via INTRAVENOUS
  Filled 2020-01-13 (×2): qty 250

## 2020-01-13 MED ORDER — SERTRALINE HCL 100 MG PO TABS: 100.0000 mg | ORAL_TABLET | Freq: Every day | ORAL | Status: DC

## 2020-01-13 MED ORDER — ENOXAPARIN SODIUM 60 MG/0.6ML ~~LOC~~ SOLN
50.0000 mg | SUBCUTANEOUS | Status: DC
  Administered 2020-01-13 – 2020-01-16 (×4): 50 mg via SUBCUTANEOUS
  Filled 2020-01-13 (×5): qty 0.5

## 2020-01-13 MED ORDER — ROSUVASTATIN CALCIUM 20 MG PO TABS
20.0000 mg | ORAL_TABLET | Freq: Every day | ORAL | Status: DC
  Administered 2020-01-13 – 2020-01-17 (×5): 20 mg
  Filled 2020-01-13 (×5): qty 1

## 2020-01-13 MED ORDER — MIDAZOLAM HCL 2 MG/2ML IJ SOLN
4.0000 mg | Freq: Once | INTRAMUSCULAR | Status: AC
  Administered 2020-01-13: 4 mg via INTRAVENOUS

## 2020-01-13 MED ORDER — SODIUM CHLORIDE 0.9 % IV SOLN
100.0000 mg | Freq: Every day | INTRAVENOUS | Status: AC
  Administered 2020-01-14 – 2020-01-17 (×4): 100 mg via INTRAVENOUS
  Filled 2020-01-13 (×4): qty 20

## 2020-01-13 NOTE — ED Notes (Signed)
Soft restraints placed to prevent pt from pulling at ET tube

## 2020-01-13 NOTE — Progress Notes (Addendum)
PCCM Progress Note  Patient arrived on unit. Not adequately sedated, fighting vent, sats 70s. No blood gas from sending facility. CXR post intubation with ETT at [place], unclear if pulled back, 23 cm at lip and documented at 22 so may be further in. Increased sedation. BPS ok. Compliant on vent, peak pressures low 30su on PEEP 14, driving pressure 17, placed on 400 cc Vt (9 cc/kg) O2 sat 89%.  Plan: Repeat CXR, move ETT if needed Repeat CMP and replace K as needed (very low this AM) Continue vent, ABG 1930, if pH > 7.25 would decrease Vt to 360 (8 cc/kg) and wean Vt no lower than 300 cc to maintain pH > 7.2 Wean O2 as able Continue remdesivir, baricitinib Placed on dexamethasone 6 mg daily (was ordered total solumedrol 250 daily) VAP protocol Consider tube feeds in AM  CRITICAL CARE Performed by: Lanier Clam   Total critical care time: 30 minutes  Critical care time was exclusive of separately billable procedures and treating other patients.  Critical care was necessary to treat or prevent imminent or life-threatening deterioration.  Critical care was time spent personally by me on the following activities: development of treatment plan with patient and/or surrogate as well as nursing, discussions with consultants, evaluation of patient's response to treatment, examination of patient, obtaining history from patient or surrogate, ordering and performing treatments and interventions, ordering and review of laboratory studies, ordering and review of radiographic studies, pulse oximetry and re-evaluation of patient's condition.

## 2020-01-13 NOTE — Progress Notes (Addendum)
Responded to nursing call:  Increase WOB and hypoxia   Subjective: Patient complains of increase sob.  Hypoxic into 50s.  Anxious and keeps taking off NRB.  Denies cp, n/v/d.   Vitals:   01/17/2020 1130 01/17/2020 1230 01/17/2020 1245 01/09/2020 1300  BP: (!) 104/53 115/68    Pulse: 98 (!) 102 (!) 103 100  Resp: (!) 47 (!) 34 (!) 36 (!) 27  Temp:      TempSrc:      SpO2: (!) 74% (!) 71% (!) 71% (!) 70%  Weight:      Height:       CV--RRR Lung-bilateral rales, no wheeze Abd--soft+BS/NT   Assessment/Plan:  Acute respiratory failure with hypoxia -had another goals of care discussion with patient -discussed grave prognosis without intubation -also discussed risks/benefits of intubation/mechanical ventilation -patient expressed understanding and consents to intubation and mechanical ventilation -case discussed with CCM, Dr. Rae Roam agrees -case discussed with Dr. Chong Sicilian kindly agreed to intubate patient -spouse informed.  Also informed spouse patient will be transferred to [facility] long ICU    Orson Eva, DO [facility]

## 2020-01-13 NOTE — ED Notes (Signed)
Heard pt screaming saying she was suffocating Pt had removed all of oxygen and face cyanotic. Pt panicking. Sats 53% . Called RT  sats slowly coming up Currently 74 %.

## 2020-01-13 NOTE — ED Notes (Signed)
RT back in room to place [facility] on pt

## 2020-01-13 NOTE — ED Notes (Signed)
Soft restraints placed on pt to protect tubes

## 2020-01-13 NOTE — H&P (Signed)
History and Physical  Martha Espinoza:[id number] DOB: [birth year] DOA: 01/20/2020   PCP: Tammi Sou, MD   Patient coming from: Home  Chief Complaint: sob  HPI:  Martha Espinoza is a 54 y.o. female with medical history of hypertension, hyperlipidemia, iron deficiency, COPD, bipolar disorder, chronic pain syndrome, peripheral neuropathy, hepatic steatosis presenting with 2 to 3 days of progressive shortness of breath and nonproductive cough.  The patient initially presented to the emergency department on 01/10/2020 after she had a positive Covid home test that she states that she administered to herself on 01/05/2020.  On her initial ED visit, the patient had complained of a headache and nonproductive cough with fevers.  She was treated symptomatically and discharged home in stable condition.  She returns to the ED on the morning of 12/26/2019 with worsening shortness of breath, nonproductive cough, and bifrontal headache.  She denies any fevers, chills, vomiting, diarrhea, abdominal pain, dysuria, hematuria.  She denies any frank hemoptysis.  The patient is not vaccinated for COVID-19.  Upon EMS arrival, the patient was noted to have oxygen saturations of 50% on her usual 3 L at home.  She states that she normally wears 3 L at nighttime but has been using it 24/7 since her previous ED visit on 01/10/2020.  She was placed on nonrebreather with improvement up to 77%. In the emergency department, the patient was afebrile with soft blood pressures with systolic blood pressures in the upper 90s and low 100s.  She was placed on heated high flow nasal cannula at 45 L and 100% FiO2 with a nonrebreather.  Her oxygen saturation remained in the mid 70s.  BMP showed a potassium 2.7 and serum creatinine 0.79.  AST 53, ALT 20, alk phosphatase 54, total bilirubin 1.1.  WBC 2.6, hemoglobin 13.5, platelets 120,000.  Chest x-ray showed interval progression of her bilateral patchy opacities.  Procalcitonin  was 0.25.  Assessment/Plan: Acute respiratory failure with hypoxia secondary to COVID-19 pneumonia -Start remdesivir -Continue IV Solu-Medrol -CRP 18.6 -Ferritin 2560 -D-dimer 2.23 -PCT 0.25 -Start baricitinib -PCCM consult  COPD -Patient has over 30-pack-year history -She quit smoking 2 months ago  Thrombocytopenia -Likely secondary to the patient's COVID-19 infection -Check folic acid -Check H47  Essential hypertension -Holding metoprolol and losartan secondary to soft blood pressure  Bipolar 1 disorder -Continue BuSpar, Klonopin, Lamictal, trazodone -Continue sertraline  Chronic pain syndrome -Continue home dose Percocet -Continue Lyrica -PMDP reviewed  Morbid obesity -BMI 46.50 -Places patient at high risk for morbidity and mortality secondary to COVID-19 infection  Goals of care discussion -I had an extensive goals of care discussion with the patient at the bedside--she affirms DNR status -Patient spouse was contacted and updated and he agreed -consult palliatiive     Past Medical History:  Diagnosis Date  . Abnormal CT of thoracic spine 09/12/2018   Noncontrast.  Increased in number and TINY increase in size of tiny, patchy sclerotic T spine lesions compared to 2018 CT.  Hem/onc summer 2020->w/u->suspected benign bone islands->survellance imaging in 6-12 mo planned.  . Allergic rhinitis   . Arthritis   . Asthma   . Bipolar 1 disorder ([place])   . Chronic pain syndrome    chronic low back pain (Pain mgmt= Dr. [name]).  MRI showed no signif disc dz, only showed some facet arthropathy at L4-5, with joint diastasis at L4-5--plan as of 11/07/16 neurosurg eval is bilat facet injections. (chronic LBP+ fibromyalgia)  . Colitis due to Clostridium difficile  2001  . COPD (chronic obstructive pulmonary disease) ([place])    PFTs 04/2016 showed no sign of copd or asthma, plus she actually had worse FEV1 after taking albuterol.  However, as of 06/2018 she has recent hx of  improvement in sx's after taking albuterol.  LONG TIME SMOKER, won't quit.  Intolerant to or cannot afford: pulmicort, QVAR, advair, symbicort, flovent, bevespi, and spiriva, anoro,stiolto, and trelegy. Pulmicort trial per pulm 12/2018  . Depression   . Fibromyalgia syndrome   . GAD (generalized anxiety disorder)   . GERD (gastroesophageal reflux disease)   . Hepatic hemangioma   . Hepatic steatosis 06/2016   Noted incidentally on CT chest 2018 and 2021  . History of Salmonella gastroenteritis   . Hoarseness    chronic: laryngoscopy showed edema of vocal folds and R focal fold leukoplakia->>NEEDS TO QUIT SMOKING! Rpt laryngoscopy 03/17/19 showed the leukoplakia spot smaller->to be followed with serial exams by ent. 06/15/19->MUCH imroved vocal fold edema and vocal cord leukoplakia: less hoarseness with smoking cessation!  . Hyperlipidemia    a. Noted 02/2014.  Marland Kitchen Hypertension   . IBS (irritable bowel syndrome)   . Iron deficiency anemia 05/2014   Per pt it is not due to vaginal blood loss; hemoccults sent to pt in mail 413/16.  . Lumbar spondylosis    Mild, mainly focused at L4-5 and L5-S1.  MRI L spine 09/09/17-->mild degenerative disc disease and facet arthrosis without any nerve root encroachment, no spinal stenosis.  . Microscopic hematuria    Intermittent (no w/u done yet, as of 03/2014)  . Migraine headache   . Morbid obesity ([place])   . Multiple lipomas 09/2016   Left upper arm--very small ones.  . Nocturnal hypoxia 09/18/2017   Overnight oximetry -->qualified: ordered 2L oxygen during sleep.  . Obesity hypoventilation syndrome ([place])   . Pain in both lower legs 2018-2019   Pain and numbness bilat LLs.  NCS/EMGs findings suggestive of S1 radiculopathy bilat: sx's + NCS findings suggestive of neurogenic claudication/spinal stenosis.  ? contribution of venous reflux?-->"idiopathic peripheral neuropathy, progressive->neurol/pain mgmt  . Peripheral edema Fall 2018   R>L, non-pitting for  the most part--venous doppler neg for DVT 01/2017.  Saw Dr. Bridgett Larsson (Vasc) 04/12/17 and u/s showed some venous reflux dz but he felt her sx's in legs were NOT due to her venous insufficiency OR PAD.  Thigh high comp stockings rx'd.  . Polycystic ovarian syndrome   . Pruritic dermatitis 2015/2016   +Scabies prep at [facility] 07/2014. (Primarily pruritic skin, but eventually a subtle rash as well)  . PTSD (post-traumatic stress disorder)   . Raynaud's phenomenon 2019  . Rectal cancer ([place])    a. Followed by Dr. Gala Romney, dx 2000-2001.  Surveillance colonoscopy planned as of 11/2018 GI f/u.  . Right shoulder pain 2017   Murphy/Wainer: RC bursitis + RC tear (MRI)--injection trial is plan per pt report 05/04/16.  . Vitamin D deficiency   . Weakness of both lower extremities 2020-2021   Dr. Merlene Laughter primary neurol, second opinion with [facility] neuro Dr. Krista Blue 07/2019--w/u unrevealing of cause  . Wheezing    Most of her resp symptoms are c/w upper airway etiology (repeated PFTs ->no obstructive dz. CT chest 2021 w/some air-trapping/small airways dz, though). Pulm referred her to ENT 12/2018.   Past Surgical History:  Procedure Laterality Date  . ABI's complete Bilateral 09/30/2017   Normal ABIs.  Great toe pressures adequate for wound healing.  Femoral artery waveforms triphasic (normal).  . CARDIOVASCULAR  STRESS TEST  03/24/14   Lexscan MIBI: mild anterior ischemia?  Cardiac CT angiogram recommended/done.  . CHOLECYSTECTOMY    . COLONOSCOPY  05/05/2007   adenoma  . coronary CT angio  03/29/14   Two vessel dz/moderate stenosis of mid LAD and proximal RCA; cath recommended.  Marland Kitchen DILATION AND CURETTAGE OF UTERUS     for vaginal bleeding  . EVENT MONITOR  04/2019   No signif abnormality (Dr. Domenic Polite)  . JOINT REPLACEMENT    . laryngoscopy  02/11/2019   (Done b/c of hoarseness and hx of wheezing"Reinke's edema of vocal folds, right vocal cord leukoplakia-->plan for bx of leukoplakia->NEEDS TO QUIT  SMOKING!  . LEFT HEART CATHETERIZATION WITH CORONARY ANGIOGRAM N/A 03/31/2014   Normal coronaries, EF 10-93%, diastolic dysfunction.  Procedure: LEFT HEART CATHETERIZATION WITH CORONARY ANGIOGRAM;  Surgeon: Sinclair Grooms, MD;  Location[facility] C[facility] LAB;  Service: Cardiovascular;  Laterality: N/A;  . LOWER EXT VENOUS DOPPLERS Bilateral 02/08/2017   NEG for DVT (bilat)  . NCS/EMG  10/03/2017   S1 radiculopathy bilat  . Overnight oximetry testing  09/2017   qualified for oxygen supplementation during sleep  . PFTs  04/2016   No sign of COPD or asthma; FEV 1 worse after albut.  . rectal mss  age 25   High-grade rectal adenoma removed from rectum   . TRANSTHORACIC ECHOCARDIOGRAM  03/23/14; 10/21/17; 05/12/19   2015 Normal.  2019: mild LVH, EF 65-70%, study technically inadequate to assess diastolic function, valves were fine, wall motion normal. 2021 EF 60-65%-->all normal.   Social History:  reports that she has quit smoking. Her smoking use included cigarettes. She has a 18.00 pack-year smoking history. She has never used smokeless tobacco. She reports that she does not drink alcohol and does not use drugs.   Family History  Problem Relation Age of Onset  . Colon cancer Father 67  . Cancer Father   . Heart disease Mother   . Hypertension Mother   . Hyperlipidemia Mother   . Mental illness Mother   . Diabetes Mother   . Heart attack Mother   . Thyroid disease Mother   . Anxiety disorder Mother   . Depression Mother   . Colon cancer Maternal Grandfather   . Bipolar disorder Son   . Stroke Neg Hx      Allergies  Allergen Reactions  . Contrast Media [Iodinated Diagnostic Agents] Anaphylaxis    Needs 13-hour prep  . Shellfish Allergy Anaphylaxis  . Codeine Nausea And Vomiting and Other (See Comments)    Hallucinations, Also sees people that are not there   . Furosemide Rash    Rash, soB.  Marland Kitchen Anoro Ellipta [Umeclidinium-Vilanterol] Other (See Comments)    Pt states it caused  blisters.  . Flovent Diskus [Fluticasone Propionate (Inhal)]     headache  . Ivermectin Nausea And Vomiting    N/V and rash  . Other   . Spiriva Handihaler [Tiotropium Bromide Monohydrate] Other (See Comments)    Severe headaches  . Symbicort [Budesonide-Formoterol Fumarate]     headache  . Zofran [Ondansetron]     Makes her sick. Takes only phenergan  . Betadine [Povidone Iodine] Rash  . Latex Hives  . Lipitor [Atorvastatin] Itching  . Permethrin Itching  . Stiolto Respimat [Tiotropium Bromide-Olodaterol] Other (See Comments)    Mouth ulcers  . Tape Dermatitis    Paper tape and clear  . Trelegy Ellipta [Fluticasone-Umeclidin-Vilant] Other (See Comments)    headaches  Prior to Admission medications   Medication Sig Start Date End Date Taking? Authorizing Provider  albuterol (PROVENTIL) (2.5 MG/3ML) 0.083% nebulizer solution Take 3 mLs (2.5 mg total) by nebulization every 6 (six) hours as needed for wheezing or shortness of breath. 08/27/18 12/26/2019 Yes McGowen, Adrian Blackwater, MD  albuterol (VENTOLIN HFA) 108 (90 Base) MCG/ACT inhaler INHALE 1 TO 2 PUFFS BY MOUTH EVERY 4 HOURS AS NEEDED Patient taking differently: Inhale 1-2 puffs into the lungs every 4 (four) hours as needed for wheezing or shortness of breath.  01/08/20  Yes Mannam, Hart Robinsons, MD  aspirin 81 MG chewable tablet Chew 1 tablet (81 mg total) by mouth daily. 09/06/12  Yes Ripley Fraise, MD  azelastine (ASTELIN) 0.1 % nasal spray Place 2 sprays into both nostrils 2 (two) times daily. 07/26/17  Yes McGowen, Adrian Blackwater, MD  busPIRone (BUSPAR) 15 MG tablet Take 1 tablet (15 mg total) by mouth 2 (two) times daily. 10/19/19  Yes Nevada Crane, MD  clonazePAM (KLONOPIN) 0.5 MG tablet TAKE ONE TABLET BY MOUTH DURING THE DAY AS NEEDED AND 2 TABLETS AT BEDTIME Patient taking differently: Take 0.5-1 mg by mouth See admin instructions. 1 tablet during the day if needed and 2 tablets at bedtime 12/09/19  Yes McGowen, Adrian Blackwater, MD   cyclobenzaprine (FLEXERIL) 10 MG tablet TAKE ONE TABLET BY MOUTH EVERY 6 HOURS AS NEEDED (SPASM RELATED TO HEADACHES) Patient taking differently: Take 10 mg by mouth every 6 (six) hours as needed (spasm related to headaches).  07/10/16  Yes McGowen, Adrian Blackwater, MD  EQ ALLERGY RELIEF, CETIRIZINE, 10 MG tablet Take 1 tablet by mouth once daily Patient taking differently: Take 10 mg by mouth daily.  10/08/19  Yes McGowen, Adrian Blackwater, MD  fluticasone (FLONASE) 50 MCG/ACT nasal spray Place 2 sprays into both nostrils daily as needed for allergies.    Yes [provider]  lamoTRIgine (LAMICTAL) 100 MG tablet Take 2 tablets at bedtime Patient taking differently: Take 200 mg by mouth at bedtime.  12/17/19  Yes Nevada Crane, MD  losartan (COZAAR) 50 MG tablet Take 1 tablet by mouth once daily Patient taking differently: Take 50 mg by mouth daily.  07/01/19  Yes McGowen, Adrian Blackwater, MD  meloxicam (MOBIC) 15 MG tablet Take 15 mg by mouth every evening.  01/01/18  Yes [provider]  metoCLOPramide (REGLAN) 10 MG tablet Take 1 tablet (10 mg total) by mouth every 8 (eight) hours as needed for nausea or vomiting. 01/10/20  Yes Sherwood Gambler, MD  metoprolol tartrate (LOPRESSOR) 50 MG tablet Take 1 tablet by mouth twice daily Patient taking differently: Take 50 mg by mouth 2 (two) times daily.  01/07/20  Yes McGowen, Adrian Blackwater, MD  oxyCODONE-acetaminophen (PERCOCET) 10-325 MG tablet Take 1 tablet by mouth every 8 (eight) hours as needed for pain.    Yes [provider]  pantoprazole (PROTONIX) 40 MG tablet Take 1 tablet (40 mg total) by mouth daily. 10/16/19  Yes McGowen, Adrian Blackwater, MD  Potassium (POTASSIMIN PO) Take 2 tablets by mouth at bedtime.   Yes [provider]  potassium chloride SA (KLOR-CON) 20 MEQ tablet Take 1 tablet (20 mEq total) by mouth daily. 01/10/20  Yes Sherwood Gambler, MD  pregabalin (LYRICA) 200 MG capsule Take 200 mg by mouth daily.  01/28/19  Yes [provider]  promethazine (PHENERGAN) 25 MG suppository Place 1 suppository (25 mg total) rectally every 6 (six) hours as needed for nausea or vomiting. 01/10/20  Yes Goldston,  Nicki Reaper, MD  promethazine (PHENERGAN) 25 MG tablet TAKE 1 TABLET BY MOUTH EVERY 6 HOURS AS NEEDED FOR NAUSEA Patient taking differently: Take 25 mg by mouth every 6 (six) hours as needed for nausea.  10/16/19  Yes McGowen, Adrian Blackwater, MD  rOPINIRole (REQUIP) 0.25 MG tablet TAKE 3 TABLETS BY MOUTH AT BEDTIME Patient taking differently: Take 0.75 mg by mouth at bedtime.  01/07/20  Yes McGowen, Adrian Blackwater, MD  rosuvastatin (CRESTOR) 20 MG tablet Take 1 tablet by mouth once daily Patient taking differently: Take 20 mg by mouth daily.  11/05/19  Yes McGowen, Adrian Blackwater, MD  sertraline (ZOLOFT) 100 MG tablet Take 1 tablet by mouth once daily Patient taking differently: Take 100 mg by mouth daily.  01/07/20  Yes Nevada Crane, MD  topiramate (TOPAMAX) 50 MG tablet Take 50 mg by mouth as needed (anxiety).  06/29/19  Yes [provider]  traZODone (DESYREL) 150 MG tablet Take 1 tablet (150 mg total) by mouth at bedtime. 12/17/19  Yes Nevada Crane, MD  Naldemedine Tosylate (SYMPROIC) 0.2 MG TABS Take 0.2 mg by mouth at bedtime.     [provider]  nitroGLYCERIN (NITROSTAT) 0.4 MG SL tablet DISSOLVE ONE TABLET UNDER THE TONGUE EVERY 5 MINUTES AS NEEDED FOR CHEST PAIN.  DO NOT EXCEED A TOTAL OF 3 DOSES IN 15 MINUTES Patient taking differently: Place 0.4 mg under the tongue every 5 (five) minutes as needed for chest pain.  11/13/18   McGowen, Adrian Blackwater, MD  citalopram (CELEXA) 40 MG tablet Take 20 mg by mouth at bedtime.   06/18/11  [provider]  omeprazole (PRILOSEC) 20 MG capsule Take 40 mg by mouth daily.  05/20/19  [provider]    Review of Systems:  Constitutional:  No weight loss, night sweats,  Head&Eyes: No headache.  No vision loss.  No eye pain or scotoma ENT:  No Difficulty  swallowing,Tooth/dental problems,Sore throat,  No ear ache, post nasal drip,  Cardio-vascular:  No chest pain, Orthopnea, PND, swelling in lower extremities,  dizziness, palpitations  GI:  No  abdominal pain, nausea, vomiting, diarrhea, loss of appetite, hematochezia, melena, heartburn, indigestion, Resp:   No coughing up of blood .No wheezing.No chest wall deformity  Skin:  no rash or lesions.  GU:  no dysuria, change in color of urine, no urgency or frequency. No flank pain.  Musculoskeletal:  No joint pain or swelling. No decreased range of motion. No back pain.  Psych:  No change in mood or affect. No depression or anxiety. Neurologic: No headache, no dysesthesia, no focal weakness, no vision loss. No syncope  Physical Exam: Vitals:   01/12/2020 0945 01/16/2020 1000 01/11/2020 1030 01/07/2020 1100  BP:  (!) 89/49 (!) 93/53 (!) 102/46  Pulse: 94 94 94 95  Resp: (!) 39 (!) 34 (!) 35 (!) 49  Temp:      TempSrc:      SpO2: (!) 76% (!) 76% (!) 76% (!) 77%  Weight:      Height:       General:  A&O x 3, mild distress, nontoxic, pleasant/cooperative Head/Eye: No conjunctival hemorrhage, no icterus, [place]/AT, No nystagmus ENT:  No icterus,  No thrush, good dentition, no pharyngeal exudate Neck:  No masses, no lymphadenpathy, no bruits CV:  RRR, no rub, no gallop, no S3 Lung:  Bilateral crackles. No wheeze Abdomen: soft/NT, +BS, nondistended, no peritoneal signs Ext: No cyanosis, No rashes, No petechiae, No lymphangitis, No edema Neuro: CNII-XII intact, strength 4/5  in bilateral upper and lower extremities, no dysmetria  Labs on Admission:  Basic Metabolic Panel: Recent Labs  Lab 01/10/20 1926 01/01/2020 0750  NA 133* 136  K 3.2* 2.7*  CL 97* 99  CO2 25 21*  GLUCOSE 125* 142*  BUN 8 14  CREATININE 0.66 0.79  CALCIUM 8.2* 8.1*   Liver Function Tests: Recent Labs  Lab 01/10/20 1926 01/23/2020 0750  AST 40 53*  ALT 20 20  ALKPHOS 53 54  BILITOT 0.8 1.1  PROT 7.4 6.8  ALBUMIN  3.9 3.4*   No results for input(s): LIPASE, AMYLASE in the last 168 hours. No results for input(s): AMMONIA in the last 168 hours. CBC: Recent Labs  Lab 01/10/20 1926 01/14/2020 0750  WBC 2.3* 2.6*  NEUTROABS 1.7 2.0  HGB 13.2 13.5  HCT 40.3 40.1  MCV 90.6 88.3  PLT 105* 120*   Coagulation Profile: No results for input(s): INR, PROTIME in the last 168 hours. Cardiac Enzymes: No results for input(s): CKTOTAL, CKMB, CKMBINDEX, TROPONINI in the last 168 hours. BNP: Invalid input(s): POCBNP CBG: No results for input(s): GLUCAP in the last 168 hours. Urine analysis:    Component Value Date/Time   COLORURINE YELLOW 05/29/2017 1605   APPEARANCEUR HAZY (A) 05/29/2017 1605   LABSPEC 1.020 05/29/2017 1605   PHURINE 6.0 05/29/2017 1605   GLUCOSEU NEGATIVE 05/29/2017 1605   GLUCOSEU NEGATIVE 04/02/2014 1500   HGBUR NEGATIVE 05/29/2017 1605   BILIRUBINUR 1+ 07/09/2019 1709   KETONESUR NEGATIVE 05/29/2017 1605   PROTEINUR Positive (A) 07/09/2019 1709   PROTEINUR NEGATIVE 05/29/2017 1605   UROBILINOGEN 1.0 07/09/2019 1709   UROBILINOGEN 0.2 09/23/2014 1830   NITRITE neg 07/09/2019 1709   NITRITE NEGATIVE 05/29/2017 1605   LEUKOCYTESUR Negative 07/09/2019 1709   Sepsis Labs: '@LABRCNTIP' (procalcitonin:4,lacticidven:4) ) Recent Results (from the past 240 hour(s))  Respiratory Panel by RT PCR (Flu A&B, Covid) - Nasopharyngeal Swab     Status: Abnormal   Collection Time: 01/10/20  7:27 PM   Specimen: Nasopharyngeal Swab  Result Value Ref Range Status   SARS Coronavirus 2 by RT PCR POSITIVE (A) NEGATIVE Final    Comment: RESULT CALLED TO, READ BACK BY AND VERIFIED WITH: WALKER,T @ 2114 ON 01/10/20 BY JUW (NOTE) SARS-CoV-2 target nucleic acids are DETECTED.  SARS-CoV-2 RNA is generally detectable in upper respiratory specimens  during the acute phase of infection. Positive results are indicative of the presence of the identified virus, but do not rule out bacterial infection or  co-infection with other pathogens not detected by the test. Clinical correlation with patient history and other diagnostic information is necessary to determine patient infection status. The expected result is Negative.  Fact Sheet for Patients:  PinkCheek.be  Fact Sheet for Healthcare Providers: GravelBags.it  This test is not yet approved or cleared by the [place] FDA and  has been authorized for detection and/or diagnosis of SARS-CoV-2 by FDA under an Emergency Use Authorization (EUA).  This EUA will remain in effect (meaning this test can be  used) for the duration of  the COVID-19 declaration under Section 564(b)(1) of the Act, 21 U.S.C. section 360bbb-3(b)(1), unless the authorization is terminated or revoked sooner.      Influenza A by PCR NEGATIVE NEGATIVE Final   Influenza B by PCR NEGATIVE NEGATIVE Final    Comment: (NOTE) The Xpert Xpress SARS-CoV-2/FLU/RSV assay is intended as an aid in  the diagnosis of influenza from Nasopharyngeal swab specimens and  should not be used as a sole basis  for treatment. Nasal washings and  aspirates are unacceptable for Xpert Xpress SARS-CoV-2/FLU/RSV  testing.  Fact Sheet for Patients: PinkCheek.be  Fact Sheet for Healthcare Providers: GravelBags.it  This test is not yet approved or cleared by the [place] FDA and  has been authorized for detection and/or diagnosis of SARS-CoV-2 by  FDA under an Emergency Use Authorization (EUA). This EUA will remain  in effect (meaning this test can be used) for the duration of the  Covid-19 declaration under Section 564(b)(1) of the Act, 21  U.S.C. section 360bbb-3(b)(1), unless the authorization is  terminated or revoked. Performed at [facility], [address], [place], [place] [address]      Radiological Exams on Admission: DG Chest [facility] 1 View  Result Date:  12/30/2019 CLINICAL DATA:  Shortness of breath.  COVID-19 positive EXAM: PORTABLE CHEST 1 VIEW COMPARISON:  01/10/2020 FINDINGS: Stable cardiomediastinal contours. Interval progression of patchy bilateral airspace opacities, most confluent within the left lung base. No pleural effusion or pneumothorax is seen. IMPRESSION: Interval progression of patchy bilateral airspace opacities, most confluent within the left lung base. Electronically Signed   By: Davina Poke D.O.   On: 01/05/2020 08:27   EKG: Independently reviewed. Sinus, nonspecific TWI    Time spent:60 minutes Code Status:   DNI/DNR Family Communication:  Spouse updated 01/04/2020 Disposition Plan: expect 3-4 day hospitalization Consults called: PCCM DVT Prophylaxis: [place] Lovenox  Orson Eva, DO  [facility] Pager [phone]  If 7PM-7AM, please contact night-coverage [email or web address] Password [email or web address] 12/26/2019, 11:18 AM

## 2020-01-13 NOTE — ED Notes (Signed)
Blood cultures obtained from 2 sites and sent to lab

## 2020-01-13 NOTE — ED Triage Notes (Addendum)
Pt brought in by EMS due to shortness . Sats in the 50"s on scene.  NRB at 15 L. sats 77% currently. Pt talking. RT in room at this time. Pt seen on 01/10/20 and was positive. Pt placed on high flow at 15 with NRB at 15

## 2020-01-13 NOTE — ED Notes (Signed)
Pt with sats still in 70's notified RT. Tightened NRB to ensure it covers the nose and pt positioned  on side

## 2020-01-13 NOTE — Progress Notes (Signed)
RT called per patients sats in the 70's despite being on a NRB. RT placed patient on NRB as well as 15LHFNC with very little improvement. RT switched O2 device and placed patient on HHFNC at 45L and 100% with improvement in sats to 80-83. MD Melina Copa made aware and was ok with patient sats in this range. RN aware. Will monitor as needed.

## 2020-01-13 NOTE — ED Notes (Signed)
Pt currently on 15 L NRB and 15 L high flow  sats remain 79%. RT will place on heated high flow. EDP in earlier to assess pt.

## 2020-01-13 NOTE — Progress Notes (Addendum)
[facility] Progress Note Patient Name: Martha Espinoza DOB: 16-Jul-1965 MRN: [id number]   Date of Service  01/15/2020  HPI/Events of Note  Multiple issues: 1. ABG on 100%/PRVC 32/TV 400/P 14 = 7.23/60.1/61.5, 2.  K+ = 3.4 and Creatinine = 0.54 and 3. Ca++ = 8.1 which corrects to 8.74 (Normal) given albumin = 3.2.   eICU Interventions  Plan: 1. Increase PRVC rate to 35. 2. Replace K+ 4. NaHCO3 100 meq iv x 1 now. 5. NaHCO3 IV infusion to run IV at 50 mL/hour. 6. Repeat ABG at 5 AM.      Intervention Category Major Interventions: Acid-Base disturbance - evaluation and management;Respiratory failure - evaluation and management;Electrolyte abnormality - evaluation and management  Dealva Lafoy [place] 01/20/2020, 9:44 PM

## 2020-01-13 NOTE — ED Notes (Signed)
Intensivist in to talk to pt. Pt very anxious pulling off O2. Sats 43% O2 replaced

## 2020-01-13 NOTE — ED Provider Notes (Signed)
[facility] EMERGENCY DEPARTMENT Provider Note   CSN: [id number] Arrival date & time: 01/19/2020  1062     History Chief Complaint  Patient presents with  . Respiratory Distress    Martha Espinoza is a 54 y.o. female.  She has significant past medical history and is on 3 L nasal cannula oxygen at all times.  She has had Covid-like symptoms for couple weeks.  She was here 3 days ago for shortness of breath nausea vomiting diarrhea and tested positive for Covid.  She is thought to be doing okay and was discharged.  She is presenting here for increased shortness of breath.  Per EMS sats were 50% on her 3 L.  Currently on nonrebreather satting 77%.  Complaining of cough with some productive dark sputum that she thinks may be blood.  Increased shortness of breath.  Back pain.  Unknown if fevers.  The history is provided by the patient and the EMS personnel.  Influenza Presenting symptoms: cough, diarrhea, fatigue, myalgias, nausea, shortness of breath and vomiting   Presenting symptoms: no headaches and no sore throat   Cough:    Cough characteristics:  Productive   Sputum characteristics:  [place]   Severity:  Moderate   Onset quality:  Gradual   Timing:  Intermittent   Progression:  Worsening   Chronicity:  New Shortness of breath:    Severity:  Severe   Onset quality:  Gradual   Timing:  Constant   Progression:  Worsening Associated symptoms: decreased physical activity   Associated symptoms: no mental status change        Past Medical History:  Diagnosis Date  . Abnormal CT of thoracic spine 09/12/2018   Noncontrast.  Increased in number and TINY increase in size of tiny, patchy sclerotic T spine lesions compared to 2018 CT.  Hem/onc summer 2020->w/u->suspected benign bone islands->survellance imaging in 6-12 mo planned.  . Allergic rhinitis   . Arthritis   . Asthma   . Bipolar 1 disorder ([place])   . Chronic pain syndrome    chronic low back pain (Pain mgmt= Dr. [name]).   MRI showed no signif disc dz, only showed some facet arthropathy at L4-5, with joint diastasis at L4-5--plan as of 11/07/16 neurosurg eval is bilat facet injections. (chronic LBP+ fibromyalgia)  . Colitis due to Clostridium difficile 2001  . COPD (chronic obstructive pulmonary disease) ([place])    PFTs 04/2016 showed no sign of copd or asthma, plus she actually had worse FEV1 after taking albuterol.  However, as of 06/2018 she has recent hx of improvement in sx's after taking albuterol.  LONG TIME SMOKER, won't quit.  Intolerant to or cannot afford: pulmicort, QVAR, advair, symbicort, flovent, bevespi, and spiriva, anoro,stiolto, and trelegy. Pulmicort trial per pulm 12/2018  . Depression   . Fibromyalgia syndrome   . GAD (generalized anxiety disorder)   . GERD (gastroesophageal reflux disease)   . Hepatic hemangioma   . Hepatic steatosis 06/2016   Noted incidentally on CT chest 2018 and 2021  . History of Salmonella gastroenteritis   . Hoarseness    chronic: laryngoscopy showed edema of vocal folds and R focal fold leukoplakia->>NEEDS TO QUIT SMOKING! Rpt laryngoscopy 03/17/19 showed the leukoplakia spot smaller->to be followed with serial exams by ent. 06/15/19->MUCH imroved vocal fold edema and vocal cord leukoplakia: less hoarseness with smoking cessation!  . Hyperlipidemia    a. Noted 02/2014.  Marland Kitchen Hypertension   . IBS (irritable bowel syndrome)   . Iron deficiency anemia 05/2014  Per pt it is not due to vaginal blood loss; hemoccults sent to pt in mail 413/16.  . Lumbar spondylosis    Mild, mainly focused at L4-5 and L5-S1.  MRI L spine 09/09/17-->mild degenerative disc disease and facet arthrosis without any nerve root encroachment, no spinal stenosis.  . Microscopic hematuria    Intermittent (no w/u done yet, as of 03/2014)  . Migraine headache   . Morbid obesity ([place])   . Multiple lipomas 09/2016   Left upper arm--very small ones.  . Nocturnal hypoxia 09/18/2017   Overnight oximetry  -->qualified: ordered 2L oxygen during sleep.  . Obesity hypoventilation syndrome ([place])   . Pain in both lower legs 2018-2019   Pain and numbness bilat LLs.  NCS/EMGs findings suggestive of S1 radiculopathy bilat: sx's + NCS findings suggestive of neurogenic claudication/spinal stenosis.  ? contribution of venous reflux?-->"idiopathic peripheral neuropathy, progressive->neurol/pain mgmt  . Peripheral edema Fall 2018   R>L, non-pitting for the most part--venous doppler neg for DVT 01/2017.  Saw Dr. Bridgett Larsson (Vasc) 04/12/17 and u/s showed some venous reflux dz but he felt her sx's in legs were NOT due to her venous insufficiency OR PAD.  Thigh high comp stockings rx'd.  . Polycystic ovarian syndrome   . Pruritic dermatitis 2015/2016   +Scabies prep at [facility] 07/2014. (Primarily pruritic skin, but eventually a subtle rash as well)  . PTSD (post-traumatic stress disorder)   . Raynaud's phenomenon 2019  . Rectal cancer ([place])    a. Followed by Dr. Gala Romney, dx 2000-2001.  Surveillance colonoscopy planned as of 11/2018 GI f/u.  . Right shoulder pain 2017   Murphy/Wainer: RC bursitis + RC tear (MRI)--injection trial is plan per pt report 05/04/16.  . Vitamin D deficiency   . Weakness of both lower extremities 2020-2021   Dr. Merlene Laughter primary neurol, second opinion with [place] neuro Dr. Krista Blue 07/2019--w/u unrevealing of cause  . Wheezing    Most of her resp symptoms are c/w upper airway etiology (repeated PFTs ->no obstructive dz. CT chest 2021 w/some air-trapping/small airways dz, though). Pulm referred her to ENT 12/2018.    Patient Active Problem List   Diagnosis Date Noted  . Low back pain with sciatica 08/13/2019  . Gait abnormality 08/13/2019  . Weakness 08/13/2019  . Functional gait abnormality 06/29/2019  . Shoulder pain 06/18/2019  . Primary fibromyalgia syndrome 06/18/2019  . Pain in lower limb 06/18/2019  . Idiopathic peripheral neuropathy 06/18/2019  . Long term (current) use of  opiate analgesic 06/18/2019  . Depression with anxiety 01/30/2019  . PTSD (post-traumatic stress disorder) 01/30/2019  . Bone lesion 09/25/2018  . Nocturnal hypoxia 10/04/2017  . Leg pain, bilateral 07/15/2017  . Chronic venous insufficiency 04/12/2017  . Acute respiratory failure with hypoxia ([place]) 10/18/2015  . COPD exacerbation ([place]) 10/18/2015  . Drug reaction 07/23/2015  . Fever of unknown origin 07/22/2015  . Lactic acidosis 07/22/2015  . Hypokalemia 07/22/2015  . Nausea and vomiting 07/22/2015  . Chronic back pain 07/22/2015  . Abdominal pain 07/26/2014  . Nausea without vomiting 07/26/2014  . History of colon cancer 07/26/2014  . Iron deficiency anemia 07/07/2014  . Vitamin D deficiency 07/07/2014  . Abnormal nuclear stress test 03/31/2014  . Abnormal cardiac CT angiography 03/31/2014  . Hyperlipidemia 03/24/2014  . Morbid obesity ([place])   . Chest pain of uncertain etiology 35/00/9381  . COPD (chronic obstructive pulmonary disease) ([place]) 03/23/2014  . Leukocytosis 03/23/2014  . Hyperglycemia 03/23/2014  . Bipolar 1 disorder ([place]) 03/23/2014  .  Pruritic rash 03/23/2014  . Chronic anxiety 03/23/2014  . Tobacco abuse 03/23/2014  . Intractable migraine without aura and with status migrainosus 01/14/2014  . Sinusitis 07/02/2013  . Urinary frequency 07/02/2013  . Migraine 07/02/2013  . Anxiety state, unspecified 04/14/2013  . Obesity 04/14/2013  . Anemia 04/14/2013  . CONSTIPATION 10/12/2009  . CARCINOMA IN SITU OF RECTUM 08/25/2009  . GERD 08/25/2009  . IRRITABLE BOWEL SYNDROME 08/25/2009  . DIARRHEA, CHRONIC 08/25/2009  . HEMANGIOMA, HEPATIC 08/24/2009  . POLYCYSTIC OVARIAN DISEASE 08/24/2009  . Personal history of other diseases of digestive system 08/24/2009    Past Surgical History:  Procedure Laterality Date  . ABI's complete Bilateral 09/30/2017   Normal ABIs.  Great toe pressures adequate for wound healing.  Femoral artery waveforms triphasic (normal).  .  CARDIOVASCULAR STRESS TEST  03/24/14   Lexscan MIBI: mild anterior ischemia?  Cardiac CT angiogram recommended/done.  . CHOLECYSTECTOMY    . COLONOSCOPY  05/05/2007   adenoma  . coronary CT angio  03/29/14   Two vessel dz/moderate stenosis of mid LAD and proximal RCA; cath recommended.  Marland Kitchen DILATION AND CURETTAGE OF UTERUS     for vaginal bleeding  . EVENT MONITOR  04/2019   No signif abnormality (Dr. Domenic Polite)  . JOINT REPLACEMENT    . laryngoscopy  02/11/2019   (Done b/c of hoarseness and hx of wheezing"Reinke's edema of vocal folds, right vocal cord leukoplakia-->plan for bx of leukoplakia->NEEDS TO QUIT SMOKING!  . LEFT HEART CATHETERIZATION WITH CORONARY ANGIOGRAM N/A 03/31/2014   Normal coronaries, EF 63-78%, diastolic dysfunction.  Procedure: LEFT HEART CATHETERIZATION WITH CORONARY ANGIOGRAM;  Surgeon: Sinclair Grooms, MD;  Location: [facility] CATH LAB;  Service: Cardiovascular;  Laterality: N/A;  . LOWER EXT VENOUS DOPPLERS Bilateral 02/08/2017   NEG for DVT (bilat)  . NCS/EMG  10/03/2017   S1 radiculopathy bilat  . Overnight oximetry testing  09/2017   qualified for oxygen supplementation during sleep  . PFTs  04/2016   No sign of COPD or asthma; FEV 1 worse after albut.  . rectal mss  age 71   High-grade rectal adenoma removed from rectum   . TRANSTHORACIC ECHOCARDIOGRAM  03/23/14; 10/21/17; 05/12/19   2015 Normal.  2019: mild LVH, EF 65-70%, study technically inadequate to assess diastolic function, valves were fine, wall motion normal. 2021 EF 60-65%-->all normal.     OB History    Gravida  4   Para  1   Term  1   Preterm      AB  3   Living  1     SAB  3   TAB      Ectopic      Multiple      Live Births              Family History  Problem Relation Age of Onset  . Colon cancer Father 92  . Cancer Father   . Heart disease Mother   . Hypertension Mother   . Hyperlipidemia Mother   . Mental illness Mother   . Diabetes Mother   . Heart attack Mother   .  Thyroid disease Mother   . Anxiety disorder Mother   . Depression Mother   . Colon cancer Maternal Grandfather   . Bipolar disorder Son   . Stroke Neg Hx     Social History   Tobacco Use  . Smoking status: Former Smoker    Packs/day: 1.00    Years: 18.00  Pack years: 18.00    Types: Cigarettes  . Smokeless tobacco: Never Used  . Tobacco comment: 25 years  Vaping Use  . Vaping Use: Never used  Substance Use Topics  . Alcohol use: No    Alcohol/week: 0.0 standard drinks  . Drug use: No    Home Medications Prior to Admission medications   Medication Sig Start Date End Date Taking? Authorizing Provider  albuterol (PROVENTIL) (2.5 MG/3ML) 0.083% nebulizer solution Take 3 mLs (2.5 mg total) by nebulization every 6 (six) hours as needed for wheezing or shortness of breath. 08/27/18 10/16/19  McGowen, Adrian Blackwater, MD  albuterol (VENTOLIN HFA) 108 (90 Base) MCG/ACT inhaler INHALE 1 TO 2 PUFFS BY MOUTH EVERY 4 HOURS AS NEEDED 01/08/20   Mannam, Hart Robinsons, MD  aspirin 81 MG chewable tablet Chew 1 tablet (81 mg total) by mouth daily. Patient not taking: Reported on 10/16/2019 09/06/12   Ripley Fraise, MD  azelastine (ASTELIN) 0.1 % nasal spray Place 2 sprays into both nostrils 2 (two) times daily. 07/26/17   McGowen, Adrian Blackwater, MD  busPIRone (BUSPAR) 15 MG tablet Take 1 tablet (15 mg total) by mouth 2 (two) times daily. 10/19/19   [place] Crane, MD  clonazePAM (KLONOPIN) 0.5 MG tablet TAKE ONE TABLET BY MOUTH DURING THE DAY AS NEEDED AND 2 TABLETS AT BEDTIME 12/09/19   McGowen, Adrian Blackwater, MD  cyclobenzaprine (FLEXERIL) 10 MG tablet TAKE ONE TABLET BY MOUTH EVERY 6 HOURS AS NEEDED (SPASM RELATED TO HEADACHES) 07/10/16   McGowen, Adrian Blackwater, MD  EQ ALLERGY RELIEF, CETIRIZINE, 10 MG tablet Take 1 tablet by mouth once daily 10/08/19   McGowen, Adrian Blackwater, MD  fluticasone (FLONASE) 50 MCG/ACT nasal spray Place 2 sprays into both nostrils daily as needed for allergies.     [provider]  lamoTRIgine  (LAMICTAL) 100 MG tablet Take 2 tablets at bedtime 12/17/19   [place] Crane, MD  losartan (COZAAR) 50 MG tablet Take 1 tablet by mouth once daily Patient not taking: Reported on 10/16/2019 07/01/19   Tammi Sou, MD  meloxicam (MOBIC) 15 MG tablet Take 15 mg by mouth every evening.  01/01/18   [provider]  metoCLOPramide (REGLAN) 10 MG tablet Take 1 tablet (10 mg total) by mouth every 8 (eight) hours as needed for nausea or vomiting. 01/10/20   [place]wood Gambler, MD  metoprolol tartrate (LOPRESSOR) 50 MG tablet Take 1 tablet by mouth twice daily 01/07/20   McGowen, Adrian Blackwater, MD  Naldemedine Tosylate (SYMPROIC) 0.2 MG TABS Take 0.2 mg by mouth at bedtime.     [provider]  nitroGLYCERIN (NITROSTAT) 0.4 MG SL tablet DISSOLVE ONE TABLET UNDER THE TONGUE EVERY 5 MINUTES AS NEEDED FOR CHEST PAIN.  DO NOT EXCEED A TOTAL OF 3 DOSES IN 15 MINUTES 11/13/18   McGowen, Adrian Blackwater, MD  oxyCODONE-acetaminophen (PERCOCET) 10-325 MG tablet Take 1 tablet by mouth in the morning, at noon, and at bedtime.     [provider]  pantoprazole (PROTONIX) 40 MG tablet Take 1 tablet (40 mg total) by mouth daily. 10/16/19   McGowen, Adrian Blackwater, MD  Potassium (POTASSIMIN PO) Take 2 tablets by mouth at bedtime.    [provider]  potassium chloride SA (KLOR-CON) 20 MEQ tablet Take 1 tablet (20 mEq total) by mouth daily. 01/10/20   [place] Gambler, MD  pregabalin (LYRICA) 200 MG capsule Take 200 mg by mouth daily.  01/28/19   [provider]  promethazine (PHENERGAN) 25 MG suppository Place 1  suppository (25 mg total) rectally every 6 (six) hours as needed for nausea or vomiting. 01/10/20   [place] Gambler, MD  promethazine (PHENERGAN) 25 MG tablet TAKE 1 TABLET BY MOUTH EVERY 6 HOURS AS NEEDED FOR NAUSEA 10/16/19   McGowen, Adrian Blackwater, MD  rOPINIRole (REQUIP) 0.25 MG tablet TAKE 3 TABLETS BY MOUTH AT BEDTIME 01/07/20   McGowen, Adrian Blackwater, MD  rosuvastatin (CRESTOR) 20 MG tablet Take 1  tablet by mouth once daily 11/05/19   McGowen, Adrian Blackwater, MD  sertraline (ZOLOFT) 100 MG tablet Take 1 tablet by mouth once daily 01/07/20   [place] Crane, MD  topiramate (TOPAMAX) 50 MG tablet Take 50 mg by mouth as needed.  Patient not taking: Reported on 09/07/2019 06/29/19   [provider]  traZODone (DESYREL) 150 MG tablet Take 1 tablet (150 mg total) by mouth at bedtime. 12/17/19   [place] Crane, MD  citalopram (CELEXA) 40 MG tablet Take 20 mg by mouth at bedtime.   06/18/11  [provider]  omeprazole (PRILOSEC) 20 MG capsule Take 40 mg by mouth daily.  05/20/19  [provider]    Allergies    Contrast media [iodinated diagnostic agents], Shellfish allergy, Codeine, Furosemide, Anoro ellipta [umeclidinium-vilanterol], Flovent diskus [fluticasone propionate (inhal)], Ivermectin, Other, Spiriva handihaler [tiotropium bromide monohydrate], Symbicort [budesonide-formoterol fumarate], Zofran [ondansetron], Betadine [povidone iodine], Latex, Lipitor [atorvastatin], Permethrin, Stiolto respimat [tiotropium bromide-olodaterol], Tape, and Trelegy ellipta [fluticasone-umeclidin-vilant]  Review of Systems   Review of Systems  Constitutional: Positive for activity change and fatigue.  HENT: Negative for sore throat.   Eyes: Negative for visual disturbance.  Respiratory: Positive for cough and shortness of breath.   Cardiovascular: Negative for chest pain.  Gastrointestinal: Positive for diarrhea, nausea and vomiting.  Genitourinary: Negative for dysuria.  Musculoskeletal: Positive for back pain and myalgias.  Skin: Negative for rash.  Neurological: Negative for headaches.    Physical Exam Updated Vital Signs Temp 98.4 F (36.9 C) (Oral)   Ht 5' (1.524 m)   Wt 108 kg   SpO2 (!) 72%   BMI 46.50 kg/m   Physical Exam Vitals and nursing note reviewed.  Constitutional:      General: She is in acute distress.     Appearance: She is well-developed. She is  ill-appearing.  HENT:     Head: Normocephalic and atraumatic.  Eyes:     Conjunctiva/sclera: Conjunctivae normal.  Cardiovascular:     Rate and Rhythm: Normal rate and regular rhythm.     Heart sounds: No murmur heard.   Pulmonary:     Effort: Respiratory distress present.     Breath sounds: Rhonchi present.  Abdominal:     Palpations: Abdomen is soft.     Tenderness: There is no abdominal tenderness. There is no guarding or rebound.  Musculoskeletal:        General: No deformity or signs of injury.     Cervical back: Neck supple.  Skin:    General: Skin is warm and dry.     Capillary Refill: Capillary refill takes less than 2 seconds.  Neurological:     General: No focal deficit present.     Mental Status: She is alert.     ED Results / Procedures / Treatments   Labs (all labs ordered are listed, but only abnormal results are displayed) Labs Reviewed  LACTIC ACID, PLASMA - Abnormal; Notable for the following components:      Result Value   Lactic Acid, Venous 2.6 (*)    All  other components within normal limits  CBC WITH DIFFERENTIAL/PLATELET - Abnormal; Notable for the following components:   WBC 2.6 (*)    Platelets 120 (*)    Lymphs Abs 0.4 (*)    Monocytes Absolute 0.0 (*)    All other components within normal limits  COMPREHENSIVE METABOLIC PANEL - Abnormal; Notable for the following components:   Potassium 2.7 (*)    CO2 21 (*)    Glucose, Bld 142 (*)    Calcium 8.1 (*)    Albumin 3.4 (*)    AST 53 (*)    Anion gap 16 (*)    All other components within normal limits  D-DIMER, QUANTITATIVE (NOT AT [facility]) - Abnormal; Notable for the following components:   D-Dimer, Quant 2.23 (*)    All other components within normal limits  LACTATE DEHYDROGENASE - Abnormal; Notable for the following components:   LDH 643 (*)    All other components within normal limits  FERRITIN - Abnormal; Notable for the following components:   Ferritin 2,560 (*)    All other  components within normal limits  TRIGLYCERIDES - Abnormal; Notable for the following components:   Triglycerides 314 (*)    All other components within normal limits  FIBRINOGEN - Abnormal; Notable for the following components:   Fibrinogen 595 (*)    All other components within normal limits  C-REACTIVE PROTEIN - Abnormal; Notable for the following components:   CRP 18.6 (*)    All other components within normal limits  CULTURE, BLOOD (ROUTINE X 2)  CULTURE, BLOOD (ROUTINE X 2)  LACTIC ACID, PLASMA  PROCALCITONIN  PATHOLOGIST SMEAR REVIEW  POC URINE PREG, ED    EKG EKG Interpretation  Date/Time:  Wednesday January 13 2020 85:27:78 EDT Ventricular Rate:  110 PR Interval:    QRS Duration: 91 QT Interval:  337 QTC Calculation: 456 R Axis:   70 Text Interpretation: Sinus tachycardia Low voltage, precordial leads Borderline repolarization abnormality Baseline wander in lead(s) II III aVF V1 V2 No significant change since prior 10/21 Confirmed by Aletta Edouard [phone]) on 01/05/2020 7:43:19 AM   Radiology DG Chest Port 1 View  Result Date: 01/05/2020 CLINICAL DATA:  Post intubation.  Orogastric tube placement. EXAM: PORTABLE CHEST 1 VIEW COMPARISON:  Radiographs 01/20/2020 and 01/10/2020.  CT 08/31/2019. FINDINGS: 1500 hours. Interval intubation. Tip of the endotracheal tube is at the carina and should be withdrawn approximately 3 cm. Enteric tube projects below the diaphragm, tip not clearly differentiated from multiple overlying EKG leads. There is stable mild cardiomegaly. Diffuse bilateral airspace opacities have mildly progressed, most consistent with progressive viral pneumonia in this patient with COVID 19 infection. There is no significant pleural effusion or pneumothorax. IMPRESSION: 1. Endotracheal tube tip at the carina and should be withdrawn approximately 3 cm. 2. Mildly progressive diffuse bilateral airspace opacities consistent with progressive viral pneumonia. 3.  Critical Value/emergent results were called by telephone at the time of interpretation on 01/23/2020 at 3:17 pm to provider Catalina Pizza, who verbally acknowledged these results. Electronically Signed   By: Richardean Sale M.D.   On: 12/27/2019 15:18   DG Chest Port 1 View  Result Date: 01/16/2020 CLINICAL DATA:  Shortness of breath.  COVID-19 positive EXAM: PORTABLE CHEST 1 VIEW COMPARISON:  01/10/2020 FINDINGS: Stable cardiomediastinal contours. Interval progression of patchy bilateral airspace opacities, most confluent within the left lung base. No pleural effusion or pneumothorax is seen. IMPRESSION: Interval progression of patchy bilateral airspace opacities, most confluent within the left lung  base. Electronically Signed   By: Davina Poke D.O.   On: 01/21/2020 08:27    Procedures Procedure Name: Intubation Date/Time: 01/17/2020 2:24 PM Performed by: Hayden Rasmussen, MD Pre-anesthesia Checklist: Patient identified, Patient being monitored, Emergency Drugs available, Timeout performed and Suction available Oxygen Delivery Method: Non-rebreather mask Preoxygenation: Pre-oxygenation with 100% oxygen Induction Type: Rapid sequence Ventilation: Mask ventilation without difficulty Laryngoscope Size: Glidescope and 3 Grade View: Grade II Tube size: 7.5 mm Number of attempts: 1 Placement Confirmation: ETT inserted through vocal cords under direct vision,  CO2 detector and Breath sounds checked- equal and bilateral Secured at: 25 cm Tube secured with: ETT holder Dental Injury: Teeth and Oropharynx as per pre-operative assessment  Difficulty Due To: Difficulty was anticipated Comments: Preintubation patient's oxygen saturations were 50% on maximal oxygen.  After RSI we bagged her and she stayed in the 79s.  After intubation and sats back up to 53% she is on 12 of PEEP.    .Critical Care Performed by: Hayden Rasmussen, MD Authorized by: Hayden Rasmussen, MD   Critical care  provider statement:    Critical care time (minutes):  90   Critical care time was exclusive of:  Separately billable procedures and treating other patients   Critical care was necessary to treat or prevent imminent or life-threatening deterioration of the following conditions:  Respiratory failure   Critical care was time spent personally by me on the following activities:  Discussions with consultants, evaluation of patient's response to treatment, examination of patient, ordering and performing treatments and interventions, ordering and review of laboratory studies, ordering and review of radiographic studies, pulse oximetry, re-evaluation of patient's condition, obtaining history from patient or surrogate, review of old charts and development of treatment plan with patient or surrogate   I assumed direction of critical care for this patient from another provider in my specialty: no     (including critical care time)  Medications Ordered in ED Medications  baricitinib (OLUMIANT) tablet 4 mg (4 mg Oral Given 01/17/2020 1157)  methylPREDNISolone sodium succinate (SOLU-MEDROL) 125 mg/2 mL injection 125 mg (has no administration in time range)  potassium chloride SA (KLOR-CON) CR tablet 40 mEq (40 mEq Oral Refused 01/19/2020 1159)  remdesivir 100 mg in sodium chloride 0.9 % 100 mL IVPB (0 mg Intravenous Stopped 01/20/2020 1319)    Followed by  remdesivir 100 mg in sodium chloride 0.9 % 100 mL IVPB (has no administration in time range)  clonazePAM (KLONOPIN) tablet 0.5 mg (0.5 mg Oral Given 12/31/2019 1157)  propofol (DIPRIVAN) 1000 MG/100ML infusion (has no administration in time range)  propofol (DIPRIVAN) 1000 MG/100ML infusion (has no administration in time range)  methylPREDNISolone sodium succinate (SOLU-MEDROL) 125 mg/2 mL injection 125 mg (125 mg Intravenous Given 01/08/2020 0757)  0.9 %  sodium chloride infusion ( Intravenous New Bag/Given 01/24/2020 0933)  potassium chloride 10 mEq in 100 mL IVPB (0  mEq Intravenous Stopped 01/21/2020 1419)  etomidate (AMIDATE) injection 20 mg (20 mg Intravenous Given 12/30/2019 1412)  succinylcholine (ANECTINE) injection 150 mg (150 mg Intravenous Given 01/06/2020 1413)    ED Course  I have reviewed the triage vital signs and the nursing notes.  Pertinent labs & imaging results that were available during my care of the patient were reviewed by me and considered in my medical decision making (see chart for details).  Clinical Course as of Jan 13 1423  Wed Jan 13, 2020  0833 Chest x-ray interpreted by me as multifocal pneumonia.   [  MB]  [id number] Patient's lactate elevated at 2.6.  Think this reflects her Covid and work of breathing and not true sepsis and of holding off of giving sepsis fluids.   [MB]  [id number] Respiratory currently has the patient on 45 L with high flow and nonrebreather.  Satting around 80%.  Mental status remains good.   [MB]  0920 Discussed with Dr. Carles Collet [facility] hospitalist.  He asked if I could review this with critical care because he is uncomfortable taking the patient with sats in the 91s.   [MB]  1017 Discussed with Dr. Melvyn Novas critical care.  He said at the judgment call.  If she gets intubated she probably will never come off the vent.  He will come by and see her after 12.   [MB]  4098 Patient is having declining mental status.  She has been seen by Dr. Carles Collet hospitalist and he had discussions with the patient and her spouse regarding CODE STATUS.  He has made her DNR/DNI.  Dr. Melvyn Novas critical care stop by to see the patient.  He felt she would probably benefit from intubation but now that the patient's CODE STATUS is DNR/DNI he did not have much else to offer her.  He was going to touch base with Dr. Carles Collet   [MB]  62 Was called to bedside by Dr. Carles Collet.  Patient is changing her mind and wishes to be full code.  He asked if we would assist in intubation.   [MB]    Clinical Course User Index [MB] Hayden Rasmussen, MD   MDM Rules/Calculators/A&P                          Martha Espinoza was evaluated in Emergency Department on 01/15/2020 for the symptoms described in the history of present illness. She was evaluated in the context of the global COVID-19 pandemic, which necessitated consideration that the patient might be at risk for infection with the SARS-CoV-2 virus that causes COVID-19. Institutional protocols and algorithms that pertain to the evaluation of patients at risk for COVID-19 are in a state of rapid change based on information released by regulatory bodies including the CDC and federal and state organizations. These policies and algorithms were followed during the patient's care in the ED.  This patient complains of increased shortness of breath in the setting of Covid infection; this involves an extensive number of treatment Options and is a complaint that carries with it a high risk of complications and Morbidity. The differential includes Covid, Covid pneumonia, bacterial superinfection, pneumothorax, PE, ARDS  I ordered, reviewed and interpreted labs, which included CBC with depressed white count and platelets consistent with viral infection, chemistries with low potassium and bicarb, mild elevation LFT.  Inflammatory markers elevated. I ordered medication IV fluids and IV steroids, I ordered imaging studies which included chest x-ray and I independently    visualized and interpreted imaging which showed multifocal pneumonia consistent with Covid Additional history obtained from EMS Previous records obtained and reviewed in epic, was recently here 3 days ago and discharged with reasonable saturation of vitals I consulted critical care Dr Melvyn Novas and [facility] hospitalist Dr. Carles Collet and discussed lab and imaging findings  Critical Interventions: Intubation, management of Covid hypoxia  After the interventions stated above, I reevaluated the patient and found patient still to be critically ill.  She is pending admission to an ICU bed.   Condition is guarded.    Final Clinical Impression(s) /  ED Diagnoses Final diagnoses:  Acute hypoxemic respiratory failure due to COVID-19 [facility]  Hypokalemia    Rx / DC Orders ED Discharge Orders    None       Hayden Rasmussen, MD 12/31/2019 1749

## 2020-01-13 NOTE — ED Notes (Signed)
Pt made decision to be intubated. Dr Tat in to talk with pt

## 2020-01-13 NOTE — Consult Note (Addendum)
NAME:  JANEESE Espinoza, MRN:  [id number], DOB:  September 25, 1965, LOS: 0 ADMISSION DATE:  01/12/2020, CONSULTATION DATE:  10/20 REFERRING MD:  EDP/ [facility], CHIEF COMPLAINT:  resp distress in covid 19 pna   Brief History   43 yowfwith AB quit smoking 6 week PTA on prn home 02 per PCP  with acute sob/cough  01/03/11 then home test pos for covid 01/05/20 and severely hypoxemic on arrival and by pm 10/20  high wob/ anxiety with sats in 29s and pt opted for ventilation, at least short term, p initially declining ET and  PCCM asked to evaluate at that point.   History of present illness   54 y.o. female with medical history of hypertension, hyperlipidemia, iron deficiency, COPD, bipolar disorder, chronic pain syndrome, peripheral neuropathy, hepatic steatosis presenting with 2 to 3 days of progressive shortness of breath and nonproductive cough.  The patient initially presented to the emergency department on 01/10/2020 after she had a positive Covid home test that she states that she administered to herself on 01/05/2020.  On her initial ED visit, the patient had complained of a headache and nonproductive cough with fevers.  She was treated symptomatically and discharged home in stable condition.  She returns to the ED on the morning of 01/12/2020 with worsening shortness of breath, nonproductive cough, and bifrontal headache.  She denies any fevers, chills, vomiting, diarrhea, abdominal pain, dysuria, hematuria.  She denies any frank hemoptysis.  The patient is not vaccinated for COVID-19.  Upon EMS arrival, the patient was noted to have oxygen saturations of 50% on her usual 3 L at home.  She states that she normally wears 3 L at nighttime but has been using it 24/7 since her previous ED visit on 01/10/2020.  She was placed on nonrebreather with improvement up to 77%. In the emergency department, the patient was afebrile with soft blood pressures with systolic blood pressures in the upper 90s and low 100s.  She was  placed on heated high flow nasal cannula at 45 L and 100% FiO2 with a nonrebreather.  Her oxygen saturation remained in the mid 70s and progressively declined to mid 50's way too agitated to attempt bipap s sedation so was intubated by EDP that point  Past Medical History  HBP Bipolar  Chronic pain syndrme Hepatic steatosis  AB with pfts  10.2020 by Mannam  c/w restriction with very low ERV/ no obst  Significant Hospital Events      Consults:  PCCM  Procedures:  Oral ET 10/20 >>>  Significant Diagnostic Tests:    Micro Data:  BC x 2  10/20   ID Rx:   Remdesovir  10/20 >>> Baricitinib       10/20 >>>   Scheduled Meds: . baricitinib  4 mg Oral Daily  . methylPREDNISolone (SOLU-MEDROL) injection  125 mg Intravenous Q12H  . potassium chloride  40 mEq Oral Once   Continuous Infusions: . propofol (DIPRIVAN) infusion 55 mcg/kg/min (01/17/2020 1457)  . [START ON 01/14/2020] remdesivir 100 mg in NS 100 mL     PRN Meds:.clonazePAM, midazolam    Interim history/subjective:  Very agitated even p et / propafol when not adequately sedated   Objective   Blood pressure (!) 149/93, pulse (!) 106, temperature 98.4 F (36.9 C), temperature source Oral, resp. rate 17, height 5' (1.524 m), weight 108 kg, SpO2 (!) 56 %.       No intake or output data in the 24 hours ending 01/04/2020 [place]  12/27/2019 0731  Weight: 108 kg    Examination: General: tmax 98.2 / sedated on vent HENT: oral et in place Lungs: bilateral distant insp crackles, no exp wheeze  Cardiovascular:  RRR no s3  Abdomen: obese, soft benign Extremities: warm s C C or E  Neuro: agitated unless heavily sedated    I personally reviewed images and agree with radiology impression as follows:  CXR:   10/20 portable 1. Endotracheal tube tip at the carina and should be withdrawn approximately 3 cm. 2. Mildly progressive diffuse bilateral airspace opacities consistent with progressive viral  pneumonia. Add:  ET pulled back p cxr obtained     Resolved Hospital Problem list      Assessment & Plan:  1) acute hypoxemic resp failure in setting of Cov19 pna with first symptoms around 01/03/20 unvaccinated/ rx is mainly supportive with limit to what we can offer - at baseline was not truly 02 dep which gives me hope she can pull thru but her prognosis is very guarded  Rec:  ards protocol unless too much air trapping/ proning protocol also  2) AB s/p recent smoking cessation - pfts 12/2018 s airflow obst gives some home of functional recovery here  - added albuterol   3) Hypokalemia >> replace/ monitor   Best practice:  Diet: npo Pain/Anxiety/Delirium protocol (if indicated): diprovan drip VAP protocol (if indicated):  DVT prophylaxis: hep GI prophylaxis: ppi Glucose control: per [facility]  Mobility: bed rest Code Status: full code for now but not willing to consider LT vent at time of eval Family Communication: per [facility] Disposition: to Hayden: Recent Labs  Lab 01/10/20 1926 12/26/2019 0750  WBC 2.3* 2.6*  NEUTROABS 1.7 2.0  HGB 13.2 13.5  HCT 40.3 40.1  MCV 90.6 88.3  PLT 105* 120*    Basic Metabolic Panel: Recent Labs  Lab 01/10/20 1926 01/05/2020 0750  NA 133* 136  K 3.2* 2.7*  CL 97* 99  CO2 25 21*  GLUCOSE 125* 142*  BUN 8 14  CREATININE 0.66 0.79  CALCIUM 8.2* 8.1*   GFR: Estimated Creatinine Clearance: 91.6 mL/min (by C-G formula based on SCr of 0.79 mg/dL). Recent Labs  Lab 01/10/20 1926 12/27/2019 0750 12/30/2019 0932  PROCALCITON  --  0.25  --   WBC 2.3* 2.6*  --   LATICACIDVEN  --  2.6* 1.4    Liver Function Tests: Recent Labs  Lab 01/10/20 1926 01/12/2020 0750  AST 40 53*  ALT 20 20  ALKPHOS 53 54  BILITOT 0.8 1.1  PROT 7.4 6.8  ALBUMIN 3.9 3.4*   No results for input(s): LIPASE, AMYLASE in the last 168 hours. No results for input(s): AMMONIA in the last 168 hours.  ABG    Component Value Date/Time   HCO3 24.1 (H)  10/18/2015 1726   TCO2 14.6 10/18/2015 1726   ACIDBASEDEF 0.1 10/18/2015 1726   O2SAT 88.9 10/18/2015 1726     Coagulation Profile: No results for input(s): INR, PROTIME in the last 168 hours.  Cardiac Enzymes: No results for input(s): CKTOTAL, CKMB, CKMBINDEX, TROPONINI in the last 168 hours.  HbA1C: Hgb A1c MFr Bld  Date/Time Value Ref Range Status  09/24/2018 11:37 AM 5.8 4.6 - 6.5 % Final    Comment:    Glycemic Control Guidelines for People with Diabetes:Non Diabetic:  <6%Goal of Therapy: <7%Additional Action Suggested:  >8%   03/23/2014 11:06 AM 5.9 (H) <5.7 % Final    Comment:    (  NOTE)                                                                       According to the ADA Clinical Practice Recommendations for 2011, when HbA1c is used as a screening test:  >=6.5%   Diagnostic of Diabetes Mellitus           (if abnormal result is confirmed) 5.7-6.4%   Increased risk of developing Diabetes Mellitus References:Diagnosis and Classification of Diabetes Mellitus,Diabetes BULA,4536,46(OEHOZ 1):S62-S69 and Standards of Medical Care in         Diabetes - 2011,Diabetes YYQM,2500,37 (Suppl 1):S11-S61.     CBG: No results for input(s): GLUCAP in the last 168 hours.  Review of Systems:   To [facility] covid-19 bed   Past Medical History  She,  has a past medical history of Abnormal CT of thoracic spine (09/12/2018), Allergic rhinitis, Arthritis, Asthma, Bipolar 1 disorder ([place]), Chronic pain syndrome, Colitis due to Clostridium difficile (2001), COPD (chronic obstructive pulmonary disease) ([place]), Depression, Fibromyalgia syndrome, GAD (generalized anxiety disorder), GERD (gastroesophageal reflux disease), Hepatic hemangioma, Hepatic steatosis (06/2016), History of Salmonella gastroenteritis, Hoarseness, Hyperlipidemia, Hypertension, IBS (irritable bowel syndrome), Iron deficiency anemia (05/2014), Lumbar spondylosis, Microscopic hematuria, Migraine headache, Morbid obesity ([place]),  Multiple lipomas (09/2016), Nocturnal hypoxia (09/18/2017), Obesity hypoventilation syndrome ([place]), Pain in both lower legs (2018-2019), Peripheral edema (Fall 2018), Polycystic ovarian syndrome, Pruritic dermatitis (2015/2016), PTSD (post-traumatic stress disorder), Raynaud's phenomenon (2019), Rectal cancer ([place]), Right shoulder pain (2017), Vitamin D deficiency, Weakness of both lower extremities (2020-2021), and Wheezing.   Surgical History    Past Surgical History:  Procedure Laterality Date  . ABI's complete Bilateral 09/30/2017   Normal ABIs.  Great toe pressures adequate for wound healing.  Femoral artery waveforms triphasic (normal).  . CARDIOVASCULAR STRESS TEST  03/24/14   Lexscan MIBI: mild anterior ischemia?  Cardiac CT angiogram recommended/done.  . CHOLECYSTECTOMY    . COLONOSCOPY  05/05/2007   adenoma  . coronary CT angio  03/29/14   Two vessel dz/moderate stenosis of mid LAD and proximal RCA; cath recommended.  Marland Kitchen DILATION AND CURETTAGE OF UTERUS     for vaginal bleeding  . EVENT MONITOR  04/2019   No signif abnormality (Dr. Domenic Polite)  . JOINT REPLACEMENT    . laryngoscopy  02/11/2019   (Done b/c of hoarseness and hx of wheezing"Reinke's edema of vocal folds, right vocal cord leukoplakia-->plan for bx of leukoplakia->NEEDS TO QUIT SMOKING!  . LEFT HEART CATHETERIZATION WITH CORONARY ANGIOGRAM N/A 03/31/2014   Normal coronaries, EF 04-88%, diastolic dysfunction.  Procedure: LEFT HEART CATHETERIZATION WITH CORONARY ANGIOGRAM;  Surgeon: Sinclair Grooms, MD;  [facility] C[facility] LAB;  Service: Cardiovascular;  Laterality: N/A;  . LOWER EXT VENOUS DOPPLERS Bilateral 02/08/2017   NEG for DVT (bilat)  . NCS/EMG  10/03/2017   S1 radiculopathy bilat  . Overnight oximetry testing  09/2017   qualified for oxygen supplementation during sleep  . PFTs  04/2016   No sign of COPD or asthma; FEV 1 worse after albut.  . rectal mss  age 34   High-grade rectal adenoma removed from rectum   .  TRANSTHORACIC ECHOCARDIOGRAM  03/23/14; 10/21/17; 05/12/19   2015 Normal.  2019: mild LVH, EF 65-70%, study technically  inadequate to assess diastolic function, valves were fine, wall motion normal. 2021 EF 60-65%-->all normal.     Social History   reports that she has quit smoking. Her smoking use included cigarettes. She has a 18.00 pack-year smoking history. She has never used smokeless tobacco. She reports that she does not drink alcohol and does not use drugs.   Family History   Her family history includes Anxiety disorder in her mother; Bipolar disorder in her son; Cancer in her father; Colon cancer in her maternal grandfather; Colon cancer (age of onset: 84) in her father; Depression in her mother; Diabetes in her mother; Heart attack in her mother; Heart disease in her mother; Hyperlipidemia in her mother; Hypertension in her mother; Mental illness in her mother; Thyroid disease in her mother. There is no history of Stroke.   Allergies Allergies  Allergen Reactions  . Contrast Media [Iodinated Diagnostic Agents] Anaphylaxis    Needs 13-hour prep  . Shellfish Allergy Anaphylaxis  . Codeine Nausea And Vomiting and Other (See Comments)    Hallucinations, Also sees people that are not there   . Furosemide Rash    Rash, soB.  Marland Kitchen Anoro Ellipta [Umeclidinium-Vilanterol] Other (See Comments)    Pt states it caused blisters.  . Flovent Diskus [Fluticasone Propionate (Inhal)]     headache  . Ivermectin Nausea And Vomiting    N/V and rash  . Other   . Spiriva Handihaler [Tiotropium Bromide Monohydrate] Other (See Comments)    Severe headaches  . Symbicort [Budesonide-Formoterol Fumarate]     headache  . Zofran [Ondansetron]     Makes her sick. Takes only phenergan  . Betadine [Povidone Iodine] Rash  . Latex Hives  . Lipitor [Atorvastatin] Itching  . Permethrin Itching  . Stiolto Respimat [Tiotropium Bromide-Olodaterol] Other (See Comments)    Mouth ulcers  . Tape Dermatitis     Paper tape and clear  . Trelegy Ellipta [Fluticasone-Umeclidin-Vilant] Other (See Comments)    headaches     Home Medications  Prior to Admission medications   Medication Sig Start Date End Date Taking? Authorizing Provider  albuterol (PROVENTIL) (2.5 MG/3ML) 0.083% nebulizer solution Take 3 mLs (2.5 mg total) by nebulization every 6 (six) hours as needed for wheezing or shortness of breath. 08/27/18 01/12/2020 Yes McGowen, Adrian Blackwater, MD  albuterol (VENTOLIN HFA) 108 (90 Base) MCG/ACT inhaler INHALE 1 TO 2 PUFFS BY MOUTH EVERY 4 HOURS AS NEEDED Patient taking differently: Inhale 1-2 puffs into the lungs every 4 (four) hours as needed for wheezing or shortness of breath.  01/08/20  Yes Mannam, Hart Robinsons, MD  aspirin 81 MG chewable tablet Chew 1 tablet (81 mg total) by mouth daily. 09/06/12  Yes Ripley Fraise, MD  azelastine (ASTELIN) 0.1 % nasal spray Place 2 sprays into both nostrils 2 (two) times daily. 07/26/17  Yes McGowen, Adrian Blackwater, MD  busPIRone (BUSPAR) 15 MG tablet Take 1 tablet (15 mg total) by mouth 2 (two) times daily. 10/19/19  Yes [place] Crane, MD  clonazePAM (KLONOPIN) 0.5 MG tablet TAKE ONE TABLET BY MOUTH DURING THE DAY AS NEEDED AND 2 TABLETS AT BEDTIME Patient taking differently: Take 0.5-1 mg by mouth See admin instructions. 1 tablet during the day if needed and 2 tablets at bedtime 12/09/19  Yes McGowen, Adrian Blackwater, MD  cyclobenzaprine (FLEXERIL) 10 MG tablet TAKE ONE TABLET BY MOUTH EVERY 6 HOURS AS NEEDED (SPASM RELATED TO HEADACHES) Patient taking differently: Take 10 mg by mouth every 6 (six) hours as needed (spasm related  to headaches).  07/10/16  Yes McGowen, Adrian Blackwater, MD  EQ ALLERGY RELIEF, CETIRIZINE, 10 MG tablet Take 1 tablet by mouth once daily Patient taking differently: Take 10 mg by mouth daily.  10/08/19  Yes McGowen, Adrian Blackwater, MD  fluticasone (FLONASE) 50 MCG/ACT nasal spray Place 2 sprays into both nostrils daily as needed for allergies.    Yes [provider]   lamoTRIgine (LAMICTAL) 100 MG tablet Take 2 tablets at bedtime Patient taking differently: Take 200 mg by mouth at bedtime.  12/17/19  Yes Nevada Crane, MD  losartan (COZAAR) 50 MG tablet Take 1 tablet by mouth once daily Patient taking differently: Take 50 mg by mouth daily.  07/01/19  Yes McGowen, Adrian Blackwater, MD  meloxicam (MOBIC) 15 MG tablet Take 15 mg by mouth every evening.  01/01/18  Yes [provider]  metoCLOPramide (REGLAN) 10 MG tablet Take 1 tablet (10 mg total) by mouth every 8 (eight) hours as needed for nausea or vomiting. 01/10/20  Yes Sherwood Gambler, MD  metoprolol tartrate (LOPRESSOR) 50 MG tablet Take 1 tablet by mouth twice daily Patient taking differently: Take 50 mg by mouth 2 (two) times daily.  01/07/20  Yes McGowen, Adrian Blackwater, MD  oxyCODONE-acetaminophen (PERCOCET) 10-325 MG tablet Take 1 tablet by mouth every 8 (eight) hours as needed for pain.    Yes [provider]  pantoprazole (PROTONIX) 40 MG tablet Take 1 tablet (40 mg total) by mouth daily. 10/16/19  Yes McGowen, Adrian Blackwater, MD  Potassium (POTASSIMIN PO) Take 2 tablets by mouth at bedtime.   Yes [provider]  potassium chloride SA (KLOR-CON) 20 MEQ tablet Take 1 tablet (20 mEq total) by mouth daily. 01/10/20  Yes Sherwood Gambler, MD  pregabalin (LYRICA) 200 MG capsule Take 200 mg by mouth daily.  01/28/19  Yes [provider]  promethazine (PHENERGAN) 25 MG suppository Place 1 suppository (25 mg total) rectally every 6 (six) hours as needed for nausea or vomiting. 01/10/20  Yes Sherwood Gambler, MD  promethazine (PHENERGAN) 25 MG tablet TAKE 1 TABLET BY MOUTH EVERY 6 HOURS AS NEEDED FOR NAUSEA Patient taking differently: Take 25 mg by mouth every 6 (six) hours as needed for nausea.  10/16/19  Yes McGowen, Adrian Blackwater, MD  rOPINIRole (REQUIP) 0.25 MG tablet TAKE 3 TABLETS BY MOUTH AT BEDTIME Patient taking differently: Take 0.75 mg by mouth at bedtime.  01/07/20  Yes McGowen, Adrian Blackwater, MD   rosuvastatin (CRESTOR) 20 MG tablet Take 1 tablet by mouth once daily Patient taking differently: Take 20 mg by mouth daily.  11/05/19  Yes McGowen, Adrian Blackwater, MD  sertraline (ZOLOFT) 100 MG tablet Take 1 tablet by mouth once daily Patient taking differently: Take 100 mg by mouth daily.  01/07/20  Yes Nevada Crane, MD  topiramate (TOPAMAX) 50 MG tablet Take 50 mg by mouth as needed (anxiety).  06/29/19  Yes [provider]  traZODone (DESYREL) 150 MG tablet Take 1 tablet (150 mg total) by mouth at bedtime. 12/17/19  Yes Nevada Crane, MD  Naldemedine Tosylate (SYMPROIC) 0.2 MG TABS Take 0.2 mg by mouth at bedtime.     [provider]  nitroGLYCERIN (NITROSTAT) 0.4 MG SL tablet DISSOLVE ONE TABLET UNDER THE TONGUE EVERY 5 MINUTES AS NEEDED FOR CHEST PAIN.  DO NOT EXCEED A TOTAL OF 3 DOSES IN 15 MINUTES Patient taking differently: Place 0.4 mg under the tongue every 5 (five) minutes as needed for chest pain.  11/13/18   Shawnie Dapper  H, MD  citalopram (CELEXA) 40 MG tablet Take 20 mg by mouth at bedtime.   06/18/11  [provider]  omeprazole (PRILOSEC) 20 MG capsule Take 40 mg by mouth daily.  05/20/19  [provider]       The patient is critically ill with multiple organ systems failure and requires high complexity decision making for assessment and support, frequent evaluation and titration of therapies, application of advanced monitoring technologies and extensive interpretation of multiple databases. Critical Care Time devoted to patient care services described in this note is 45 minutes.    Christinia Gully, MD Pulmonary and [place] [phone]   After 7:00 pm call Elink  [phone]

## 2020-01-13 NOTE — ED Notes (Addendum)
Phone, gold ring, diamond ring,glasses, charger, and necklace giving to security.

## 2020-01-13 NOTE — ED Notes (Signed)
CRITICAL VALUE ALERT  Critical Value:  Lactic 2.6 Kt 2.7  Date & Time Notied 10/20 0844  Provider Notified:Butler  Orders Received/Actions taken:

## 2020-01-13 NOTE — Sedation Documentation (Signed)
Pt intubated

## 2020-01-13 NOTE — ED Notes (Signed)
Pt reaching for ET tube. Having to be restrained by nurses. O2 68%. MD notified. Verbal orders given for bolus.

## 2020-01-14 ENCOUNTER — Inpatient Hospital Stay: Payer: Self-pay

## 2020-01-14 DIAGNOSIS — J9601 Acute respiratory failure with hypoxia: Secondary | ICD-10-CM | POA: Diagnosis not present

## 2020-01-14 DIAGNOSIS — U071 COVID-19: Secondary | ICD-10-CM | POA: Diagnosis not present

## 2020-01-14 LAB — BLOOD GAS, ARTERIAL
Acid-Base Excess: 3.6 mmol/L — ABNORMAL HIGH (ref 0.0–2.0)
Acid-Base Excess: 3.3 mmol/L — ABNORMAL HIGH (ref 0.0–2.0)
Acid-Base Excess: 3.1 mmol/L — ABNORMAL HIGH (ref 0.0–2.0)
Bicarbonate: 29.8 mmol/L — ABNORMAL HIGH (ref 20.0–28.0)
Bicarbonate: 30 mmol/L — ABNORMAL HIGH (ref 20.0–28.0)
Bicarbonate: 29.6 mmol/L — ABNORMAL HIGH (ref 20.0–28.0)
Drawn by: 225631
Drawn by: 225631
FIO2: 100
FIO2: 100
MECHVT: 400 mL
MECHVT: 360 mL
O2 Saturation: 77.8 %
O2 Saturation: 98 %
O2 Saturation: 98.3 %
PEEP: 14 cmH2O
PEEP: 16 cmH2O
Patient temperature: 99.5
Patient temperature: 98.8
Patient temperature: 98.6
RATE: 35 resp/min
RATE: 35 resp/min
pCO2 arterial: 57 mmHg — ABNORMAL HIGH (ref 32.0–48.0)
pCO2 arterial: 59.5 mmHg — ABNORMAL HIGH (ref 32.0–48.0)
pCO2 arterial: 58 mmHg — ABNORMAL HIGH (ref 32.0–48.0)
pH, Arterial: 7.341 — ABNORMAL LOW (ref 7.350–7.450)
pH, Arterial: 7.324 — ABNORMAL LOW (ref 7.350–7.450)
pH, Arterial: 7.328 — ABNORMAL LOW (ref 7.350–7.450)
pO2, Arterial: 44.3 mmHg — ABNORMAL LOW (ref 83.0–108.0)
pO2, Arterial: 112 mmHg — ABNORMAL HIGH (ref 83.0–108.0)
pO2, Arterial: 115 mmHg — ABNORMAL HIGH (ref 83.0–108.0)

## 2020-01-14 LAB — GLUCOSE, CAPILLARY
Glucose-Capillary: 126 mg/dL — ABNORMAL HIGH (ref 70–99)
Glucose-Capillary: 125 mg/dL — ABNORMAL HIGH (ref 70–99)

## 2020-01-14 LAB — CBC WITH DIFFERENTIAL/PLATELET
Abs Immature Granulocytes: 0.03 K/uL (ref 0.00–0.07)
Basophils Absolute: 0 K/uL (ref 0.0–0.1)
Basophils Relative: 0 %
Eosinophils Absolute: 0 K/uL (ref 0.0–0.5)
Eosinophils Relative: 0 %
HCT: 38.8 % (ref 36.0–46.0)
Hemoglobin: 12.4 g/dL (ref 12.0–15.0)
Immature Granulocytes: 1 %
Lymphocytes Relative: 37 %
Lymphs Abs: 0.9 K/uL (ref 0.7–4.0)
MCH: 29 pg (ref 26.0–34.0)
MCHC: 32 g/dL (ref 30.0–36.0)
MCV: 90.9 fL (ref 80.0–100.0)
Monocytes Absolute: 0.2 K/uL (ref 0.1–1.0)
Monocytes Relative: 6 %
Neutro Abs: 1.4 K/uL — ABNORMAL LOW (ref 1.7–7.7)
Neutrophils Relative %: 56 %
Platelets: 130 K/uL — ABNORMAL LOW (ref 150–400)
RBC: 4.27 MIL/uL (ref 3.87–5.11)
RDW: 15.1 % (ref 11.5–15.5)
WBC: 2.5 K/uL — ABNORMAL LOW (ref 4.0–10.5)
nRBC: 0 % (ref 0.0–0.2)

## 2020-01-14 LAB — VITAMIN B12: Vitamin B-12: 366 pg/mL (ref 180–914)

## 2020-01-14 LAB — PATHOLOGIST SMEAR REVIEW

## 2020-01-14 LAB — COMPREHENSIVE METABOLIC PANEL WITH GFR
ALT: 17 U/L (ref 0–44)
AST: 42 U/L — ABNORMAL HIGH (ref 15–41)
Albumin: 3.1 g/dL — ABNORMAL LOW (ref 3.5–5.0)
Alkaline Phosphatase: 52 U/L (ref 38–126)
Anion gap: 11 (ref 5–15)
BUN: 13 mg/dL (ref 6–20)
CO2: 26 mmol/L (ref 22–32)
Calcium: 8 mg/dL — ABNORMAL LOW (ref 8.9–10.3)
Chloride: 106 mmol/L (ref 98–111)
Creatinine, Ser: 0.66 mg/dL (ref 0.44–1.00)
GFR, Estimated: >60 mL/min (ref >60)
Glucose, Bld: 128 mg/dL — ABNORMAL HIGH (ref 70–99)
Potassium: 3.6 mmol/L (ref 3.5–5.1)
Sodium: 143 mmol/L (ref 135–145)
Total Bilirubin: 0.8 mg/dL (ref 0.3–1.2)
Total Protein: 5.9 g/dL — ABNORMAL LOW (ref 6.5–8.1)

## 2020-01-14 LAB — FOLATE: Folate: 3.8 ng/mL — ABNORMAL LOW (ref >5.9)

## 2020-01-14 LAB — FERRITIN: Ferritin: 4151 ng/mL — ABNORMAL HIGH (ref 11–307)

## 2020-01-14 LAB — D-DIMER, QUANTITATIVE: D-Dimer, Quant: 6.59 ug{FEU}/mL — ABNORMAL HIGH (ref 0.00–0.50)

## 2020-01-14 LAB — MAGNESIUM: Magnesium: 2.3 mg/dL (ref 1.7–2.4)

## 2020-01-14 LAB — C-REACTIVE PROTEIN: CRP: 18.2 mg/dL — ABNORMAL HIGH (ref <1.0)

## 2020-01-14 LAB — HIV ANTIBODY (ROUTINE TESTING W REFLEX): HIV Screen 4th Generation wRfx: NONREACTIVE

## 2020-01-14 MED ORDER — VECURONIUM BROMIDE 10 MG IV SOLR
0.0000 ug/kg/min | INTRAVENOUS | Status: DC
  Administered 2020-01-14 – 2020-01-15 (×2): 1 ug/kg/min via INTRAVENOUS
  Administered 2020-01-16: 0.1 ug/kg/min via INTRAVENOUS
  Filled 2020-01-14 (×4): qty 100

## 2020-01-14 MED ORDER — INSULIN ASPART 100 UNIT/ML ~~LOC~~ SOLN
0.0000 [IU] | SUBCUTANEOUS | Status: DC
  Administered 2020-01-14 – 2020-01-15 (×3): 2 [IU] via SUBCUTANEOUS
  Administered 2020-01-16 (×2): 3 [IU] via SUBCUTANEOUS
  Administered 2020-01-16 – 2020-01-17 (×2): 2 [IU] via SUBCUTANEOUS
  Administered 2020-01-17: 3 [IU] via SUBCUTANEOUS
  Administered 2020-01-17 (×2): 2 [IU] via SUBCUTANEOUS

## 2020-01-14 MED ORDER — CLONAZEPAM 0.5 MG PO TBDP
0.5000 mg | ORAL_TABLET | Freq: Every day | ORAL | Status: DC
  Administered 2020-01-14 – 2020-01-17 (×4): 0.5 mg
  Filled 2020-01-14 (×4): qty 1

## 2020-01-14 MED ORDER — VECURONIUM BOLUS VIA INFUSION
10.0000 mg | Freq: Once | INTRAVENOUS | Status: AC
  Administered 2020-01-14: 10 mg via INTRAVENOUS
  Filled 2020-01-14: qty 10

## 2020-01-14 MED ORDER — FENTANYL BOLUS VIA INFUSION
50.0000 ug | INTRAVENOUS | Status: DC | PRN
  Administered 2020-01-14 – 2020-01-16 (×11): 50 ug via INTRAVENOUS
  Filled 2020-01-14: qty 50

## 2020-01-14 MED ORDER — ARTIFICIAL TEARS OPHTHALMIC OINT
1.0000 "application " | TOPICAL_OINTMENT | Freq: Three times a day (TID) | OPHTHALMIC | Status: DC
  Administered 2020-01-14 – 2020-01-17 (×11): 1 via OPHTHALMIC
  Filled 2020-01-14: qty 3.5

## 2020-01-14 MED ORDER — MIDAZOLAM BOLUS VIA INFUSION
1.0000 mg | INTRAVENOUS | Status: DC | PRN
  Administered 2020-01-15 – 2020-01-16 (×4): 2 mg via INTRAVENOUS
  Filled 2020-01-14: qty 2

## 2020-01-14 MED ORDER — MIDAZOLAM 50MG/50ML (1MG/ML) PREMIX INFUSION
2.0000 mg/h | INTRAVENOUS | Status: DC
  Administered 2020-01-14: 8 mg/h via INTRAVENOUS
  Administered 2020-01-14: 9 mg/h via INTRAVENOUS
  Administered 2020-01-14 – 2020-01-15 (×2): 8 mg/h via INTRAVENOUS
  Administered 2020-01-15: 6 mg/h via INTRAVENOUS
  Administered 2020-01-16: 3 mg/h via INTRAVENOUS
  Administered 2020-01-16: 4 mg/h via INTRAVENOUS
  Administered 2020-01-17 – 2020-01-18 (×2): 3 mg/h via INTRAVENOUS
  Filled 2020-01-14 (×9): qty 50

## 2020-01-14 MED ORDER — CLONAZEPAM 0.5 MG PO TBDP
1.0000 mg | ORAL_TABLET | Freq: Every day | ORAL | Status: DC
  Administered 2020-01-14 – 2020-01-17 (×4): 1 mg
  Filled 2020-01-14 (×4): qty 2

## 2020-01-14 MED ORDER — FENTANYL 2500MCG IN NS 250ML (10MCG/ML) PREMIX INFUSION
0.0000 ug/h | INTRAVENOUS | Status: DC
  Administered 2020-01-14 (×2): 300 ug/h via INTRAVENOUS
  Administered 2020-01-14: 325 ug/h via INTRAVENOUS
  Administered 2020-01-15 (×2): 400 ug/h via INTRAVENOUS
  Administered 2020-01-15: 350 ug/h via INTRAVENOUS
  Administered 2020-01-16: 300 ug/h via INTRAVENOUS
  Administered 2020-01-16: 350 ug/h via INTRAVENOUS
  Administered 2020-01-16: 300 ug/h via INTRAVENOUS
  Administered 2020-01-17 (×2): 350 ug/h via INTRAVENOUS
  Administered 2020-01-17: 300 ug/h via INTRAVENOUS
  Administered 2020-01-18: 350 ug/h via INTRAVENOUS
  Filled 2020-01-14 (×12): qty 250

## 2020-01-14 MED ORDER — MIDAZOLAM HCL (PF) 5 MG/ML IJ SOLN: 5.0000 mg | INTRAMUSCULAR | Status: DC | PRN

## 2020-01-14 MED ORDER — MIDAZOLAM BOLUS VIA INFUSION
5.0000 mg | INTRAVENOUS | Status: DC | PRN
  Filled 2020-01-14: qty 5

## 2020-01-14 MED ORDER — FENTANYL BOLUS VIA INFUSION
25.0000 ug | INTRAVENOUS | Status: DC | PRN
  Administered 2020-01-14: 50 ug via INTRAVENOUS
  Filled 2020-01-14: qty 50

## 2020-01-14 MED ORDER — HYDROMORPHONE HCL 1 MG/ML PO LIQD
2.0000 mg | Freq: Four times a day (QID) | ORAL | Status: DC
  Administered 2020-01-14 – 2020-01-15 (×4): 2 mg via ORAL
  Filled 2020-01-14 (×6): qty 2

## 2020-01-14 MED ORDER — HYDROMORPHONE HCL 1 MG/ML PO LIQD: 4.0000 mg | Freq: Four times a day (QID) | ORAL | Status: DC

## 2020-01-14 MED ORDER — FENTANYL CITRATE (PF) 100 MCG/2ML IJ SOLN
50.0000 ug | Freq: Once | INTRAMUSCULAR | Status: AC
  Administered 2020-01-14: 50 ug via INTRAVENOUS

## 2020-01-14 MED ORDER — POTASSIUM CHLORIDE 20 MEQ/15ML (10%) PO SOLN
40.0000 meq | Freq: Once | ORAL | Status: AC
  Administered 2020-01-14: 40 meq
  Filled 2020-01-14: qty 30

## 2020-01-14 MED ORDER — FENTANYL BOLUS VIA INFUSION
200.0000 ug | INTRAVENOUS | Status: DC | PRN
  Filled 2020-01-14: qty 200

## 2020-01-14 NOTE — Progress Notes (Signed)
Assisted tele visit to patient with son.  Martha Espinoza P, RN  

## 2020-01-14 NOTE — Progress Notes (Signed)
Pts. head/arms turned uneventfully at this time to opposite positioning per [facility] Protocol, X staff at bedside.

## 2020-01-14 NOTE — TOC Initial Note (Signed)
Transition of Care [facility]) - Initial/Assessment Note    Patient Details  Name: Martha Espinoza MRN: [id number] Date of Birth: 1965/09/06  Transition of Care [facility]) CM/SW Contact:    Leeroy Cha, RN Phone Number: 01/14/2020, 9:04 AM  Clinical Narrative:                 54 y.o. female with medical history of hypertension, hyperlipidemia, iron deficiency, COPD, bipolar disorder, chronic pain syndrome, peripheral neuropathy, hepatic steatosis presenting with 2 to 3 days of progressive shortness of breath and nonproductive cough.  The patient initially presented to the emergency department on 01/10/2020 after she had a positive Covid home test that she states that she administered to herself on 01/05/2020.  On her initial ED visit, the patient had complained of a headache and nonproductive cough with fevers.  She was treated symptomatically and discharged home in stable condition.  She returns to the ED on the morning of 01/14/2020 with worsening shortness of breath, nonproductive cough, and bifrontal headache.  She denies any fevers, chills, vomiting, diarrhea, abdominal pain, dysuria, hematuria.  She denies any frank hemoptysis.  The patient is not vaccinated for COVID-19.  Upon EMS arrival, the patient was noted to have oxygen saturations of 50% on her usual 3 L at home.  She states that she normally wears 3 L at nighttime but has been using it 24/7 since her previous ED visit on 01/10/2020.  She was placed on nonrebreather with improvement up to 77%. In the emergency department, the patient was afebrile with soft blood pressures with systolic blood pressures in the upper 90s and low 100s.  She was placed on heated high flow nasal cannula at 45 L and 100% FiO2 with a nonrebreather.  Her oxygen saturation remained in the mid 70s.  BMP showed a potassium 2.7 and serum creatinine 0.79.  AST 53, ALT 20, alk phosphatase 54, total bilirubin 1.1.  WBC 2.6, hemoglobin 13.5, platelets 120,000.  Chest x-ray  showed interval progression of her bilateral patchy opacities.  Procalcitonin was 0.25.  Assessment/Plan: Acute respiratory failure with hypoxia secondary to COVID-19 pneumonia -Start remdesivir -Continue IV Solu-Medrol -CRP 18.6 -Ferritin 2560 -D-dimer 2.23 -PCT 0.25 -Start baricitinib -PCCM consult  COPD -Patient has over 30-pack-year history -She quit smoking 2 months ago  Thrombocytopenia -Likely secondary to the patient's COVID-19 infection -Check folic acid -Check U38  Essential hypertension -Holding metoprolol and losartan secondary to soft blood pressure  Bipolar 1 disorder -Continue BuSpar, Klonopin, Lamictal, trazodone -Continue sertraline  Chronic pain syndrome -Continue home dose Percocet -Continue Lyrica -PMDP reviewed  Morbid obesity -BMI 46.50 -Places patient at high risk for morbidity and mortality secondary to COVID-19 infection  Goals of care discussion -I had an extensive goals of care discussion with the patient at the bedside--she affirms DNR status -Patient spouse was contacted and updated and he agreed -consult palliatiive porgnosis is poor at time of review Married lives at home hope is to return to home Expected Discharge Plan: [place] Barriers to Discharge: Barriers Unresolved (comment)   Patient Goals and CMS Choice Patient states their goals for this hospitalization and ongoing recovery are:: unable to speak at this time      Expected Discharge Plan and Services Expected Discharge Plan: [place]   Discharge Planning Services: CM Consult   Living arrangements for the past 2 months: Single Family Home  Prior Living Arrangements/Services Living arrangements for the past 2 months: [place]   Patient language and need for interpreter reviewed:: Yes        Need for Family Participation in Patient Care: Yes (Comment) Care giver  support system in place?: Yes (comment)   Criminal Activity/Legal Involvement Pertinent to Current Situation/Hospitalization: No - Comment as needed  Activities of Daily Living Home Assistive Devices/Equipment: Eyeglasses, Environmental consultant (specify type), Wheelchair ADL Screening (condition at time of admission) Patient's cognitive ability adequate to safely complete daily activities?: No Is the patient deaf or have difficulty hearing?: No Does the patient have difficulty seeing, even when wearing glasses/contacts?: No Does the patient have difficulty concentrating, remembering, or making decisions?: Yes (confused with illness) Patient able to express need for assistance with ADLs?: Yes Does the patient have difficulty dressing or bathing?: No Independently performs ADLs?: No Communication: Dependent Is this a change from baseline?: Change from baseline, expected to last >3 days Dressing (OT): Dependent Grooming: Needs assistance Is this a change from baseline?: Pre-admission baseline Feeding: Dependent Is this a change from baseline?: Change from baseline, expected to last >3 days Bathing: Dependent Is this a change from baseline?: Change from baseline, expected to last >3 days Toileting: Dependent (husband usually helps pt in and out of tub) Is this a change from baseline?: Change from baseline, expected to last >3days In/Out Bed: Dependent Is this a change from baseline?: Change from baseline, expected to last >3 days Walks in Home: Dependent Is this a change from baseline?: Change from baseline, expected to last >3 days Does the patient have difficulty walking or climbing stairs?: Yes Weakness of Legs: Both Weakness of Arms/Hands: Both  Permission Sought/Granted                  Emotional Assessment Appearance:: Appears stated age Attitude/Demeanor/Rapport: Unable to Assess Affect (typically observed): Unable to Assess   Alcohol / Substance Use: Tobacco Use Psych Involvement:  No (comment)  Admission diagnosis:  Hypokalemia [E87.6] Acute respiratory failure due to COVID-19 (HCC) [U07.1, J96.00] Acute hypoxemic respiratory failure due to COVID-19 ([place]) [U07.1, J96.01] Patient Active Problem List   Diagnosis Date Noted  . Acute hypoxemic respiratory failure due to COVID-19 ([place]) 01/03/2020  . Goals of care, counseling/discussion 01/16/2020  . Obesity, Class III, BMI 40-49.9 (morbid obesity) ([place]) 12/27/2019  . Chronic pain syndrome 01/22/2020  . Thrombocytopenia ([place]) 12/27/2019  . Low back pain with sciatica 08/13/2019  . Gait abnormality 08/13/2019  . Weakness 08/13/2019  . Functional gait abnormality 06/29/2019  . Shoulder pain 06/18/2019  . Primary fibromyalgia syndrome 06/18/2019  . Pain in lower limb 06/18/2019  . Idiopathic peripheral neuropathy 06/18/2019  . Long term (current) use of opiate analgesic 06/18/2019  . Depression with anxiety 01/30/2019  . PTSD (post-traumatic stress disorder) 01/30/2019  . Bone lesion 09/25/2018  . Nocturnal hypoxia 10/04/2017  . Leg pain, bilateral 07/15/2017  . Chronic venous insufficiency 04/12/2017  . Acute respiratory failure with hypoxia ([place]) 10/18/2015  . COPD exacerbation ([place]) 10/18/2015  . Drug reaction 07/23/2015  . Fever of unknown origin 07/22/2015  . Lactic acidosis 07/22/2015  . Hypokalemia 07/22/2015  . Nausea and vomiting 07/22/2015  . Chronic back pain 07/22/2015  . Abdominal pain 07/26/2014  . Nausea without vomiting 07/26/2014  . History of colon cancer 07/26/2014  . Iron deficiency anemia 07/07/2014  . Vitamin D deficiency 07/07/2014  . Abnormal nuclear stress test 03/31/2014  . Abnormal cardiac CT angiography 03/31/2014  . Hyperlipidemia 03/24/2014  .  Morbid obesity ([place])   . Chest pain of uncertain etiology 46/65/9935  . COPD (chronic obstructive pulmonary disease) ([place]) 03/23/2014  . Leukocytosis 03/23/2014  . Hyperglycemia 03/23/2014  . Bipolar 1 disorder ([place]) 03/23/2014  .  Pruritic rash 03/23/2014  . Chronic anxiety 03/23/2014  . Tobacco abuse 03/23/2014  . Intractable migraine without aura and with status migrainosus 01/14/2014  . Sinusitis 07/02/2013  . Urinary frequency 07/02/2013  . Migraine 07/02/2013  . Anxiety state, unspecified 04/14/2013  . Obesity 04/14/2013  . Anemia 04/14/2013  . CONSTIPATION 10/12/2009  . CARCINOMA IN SITU OF RECTUM 08/25/2009  . GERD 08/25/2009  . IRRITABLE BOWEL SYNDROME 08/25/2009  . DIARRHEA, CHRONIC 08/25/2009  . HEMANGIOMA, HEPATIC 08/24/2009  . POLYCYSTIC OVARIAN DISEASE 08/24/2009  . Personal history of other diseases of digestive system 08/24/2009   PCP:  Tammi Sou, MD Pharmacy:   [place], [place] - [place] [address] [place] [address] [place] [address] Phone: [phone] Fax: [phone]     Social Determinants of Health (SDOH) Interventions    Readmission Risk Interventions No flowsheet data found.

## 2020-01-14 NOTE — Progress Notes (Signed)
Initial Nutrition Assessment  RD working remotely.  DOCUMENTATION CODES:   Morbid obesity  INTERVENTION:  - recommend initiation of TF. - if patient to be proned, recommend small bore NGT placement prior to proning to decrease risk of pressure injuries d/t OGT.   - TF recommendation: Vital AF 1.2 @ 25 ml/hr to advance by 10 ml every 8 hours to reach goal rate of 45 ml/hr with 90 ml Prosource TF BID. - at goal rate, this regimen will provide 1456 kcal, 125 grams protein, and 876 ml free water. - free water flush per CCM.    NUTRITION DIAGNOSIS:   Increased nutrient needs related to acute illness, catabolic illness ([id number] infection) as evidenced by estimated needs  GOAL:   Provide needs based on ASPEN/SCCM guidelines  MONITOR:   Vent status, Labs, Weight trends  REASON FOR ASSESSMENT:   Ventilator  ASSESSMENT:   54 y.o. female with medical history of HTN, HLD, iron deficiency, COPD, bipolar disorder, chronic pain syndrome, peripheral neuropathy, and hepatic steatosis. She presented to the ED with 2-3 day history of progressive SOB, non-productive cough, and headache. She denied N/V/D or abdominal pain. Tested positive for COVID around 01/10/20.  Patient was intubated in the ED yesterday afternoon and OGT placed at that time; CXR from yesterday states that tube is in the stomach.   Weight yesterday was 238 lb and weight has been fairly stable since 06/2019. Generalized non-pitting edema documented in the flow sheet.    Patient is currently intubated on ventilator support MV: 11.4 L/min Temp (24hrs), Avg:99.6 F (37.6 C), Min:99.1 F (37.3 C), Max:100.9 F (38.3 C) Propofol: now off  Labs reviewed; Ca: 8 mg/dl, AST slightly elevated.  Medications reviewed; 500 mg ascorbic acid/day, 40 mg IV protonix/day, 10 mEq IV KCl x4 runs 10/20, 40 mEq KCl x1 dose 10/20 and x1 dose 10/21, 100 mg IV remdesivir x2 doses 10/20 and x1 dose/day x4 days (10/21-10/24), 220 mg zinc  sulfate/day.  Drips; vec @ 1 mcg/kg/min, fentanyl @ 300 mcg/hr, versed @ 8 mg/hr.     NUTRITION - FOCUSED PHYSICAL EXAM:  unable to complete at this time.   Diet Order:   Diet Order    None      EDUCATION NEEDS:   No education needs have been identified at this time  Skin:  Skin Assessment: Reviewed RN Assessment  Last BM:  PTA/unknown  Height:   Ht Readings from Last 1 Encounters:  01/17/2020 5' (1.524 m)    Weight:   Wt Readings from Last 1 Encounters:  01/03/2020 108 kg     Estimated Nutritional Needs:  Kcal:  1420 kcal Protein:  >/= 114 grams Fluid:  >/= 1.8 L/day      Jarome Matin, MS, RD, LDN, CNSC Inpatient Clinical Dietitian RD pager # available in [facility]  After hours/weekend pager # available in [facility]

## 2020-01-14 NOTE — Progress Notes (Signed)
Pt proned at 1230. No complications noted, vitals stable at this time, RT will continue to monitor.

## 2020-01-14 NOTE — Progress Notes (Signed)
Consent obtained from spouse for PICC placement. Patient currently in prone position until 10-22 AM. Primary RN aware. VAST to follow up in AM for placement.

## 2020-01-14 NOTE — Consult Note (Signed)
NAME:  Martha Espinoza, MRN:  [id number], DOB:  11-28-1965, LOS: 1 ADMISSION DATE:  01/11/2020, CONSULTATION DATE:  10/20 REFERRING MD:  EDP/ [facility], CHIEF COMPLAINT:  resp distress in covid 19 pna   Brief History   40 yowfwith AB quit smoking 6 week PTA on prn home 02 per PCP  with acute sob/cough  01/03/11 then home test pos for covid 01/05/20 and severely hypoxemic on arrival and by pm 10/20  high wob/ anxiety with sats in 62s and pt opted for ventilation, at least short term, p initially declining ET and  PCCM asked to evaluate at that point.   History of present illness   54 y.o. female with medical history of hypertension, hyperlipidemia, iron deficiency, COPD, bipolar disorder, chronic pain syndrome, peripheral neuropathy, hepatic steatosis presenting with 2 to 3 days of progressive shortness of breath and nonproductive cough.  The patient initially presented to the emergency department on 01/10/2020 after she had a positive Covid home test that she states that she administered to herself on 01/05/2020.  On her initial ED visit, the patient had complained of a headache and nonproductive cough with fevers.  She was treated symptomatically and discharged home in stable condition.  She returns to the ED on the morning of 12/25/2019 with worsening shortness of breath, nonproductive cough, and bifrontal headache.  She denies any fevers, chills, vomiting, diarrhea, abdominal pain, dysuria, hematuria.  She denies any frank hemoptysis.  The patient is not vaccinated for COVID-19.  Upon EMS arrival, the patient was noted to have oxygen saturations of 50% on her usual 3 L at home.  She states that she normally wears 3 L at nighttime but has been using it 24/7 since her previous ED visit on 01/10/2020.  She was placed on nonrebreather with improvement up to 77%. In the emergency department, the patient was afebrile with soft blood pressures with systolic blood pressures in the upper 90s and low 100s.  She was  placed on heated high flow nasal cannula at 45 L and 100% FiO2 with a nonrebreather.  Her oxygen saturation remained in the mid 70s and progressively declined to mid 50's way too agitated to attempt bipap s sedation so was intubated by EDP that point  Past Medical History  HBP Bipolar  Chronic pain syndrme Hepatic steatosis  AB with pfts  10.2020 by Mannam  c/w restriction with very low ERV/ no obst  Significant Hospital Events      Consults:  PCCM  Procedures:  Oral ET 10/20 >>>  Significant Diagnostic Tests:    Micro Data:  BC x 2  10/20   ID Rx:  Remdesovir  10/20 >>> Baricitinib       10/20 >>>   Scheduled Meds: . artificial tears  1 application Both Eyes W8S  . vitamin C  500 mg Per Tube Daily  . aspirin  81 mg Per Tube Daily  . baricitinib  4 mg Per Tube Daily  . busPIRone  15 mg Per Tube BID  . chlorhexidine gluconate (MEDLINE KIT)  15 mL Mouth Rinse BID  . Chlorhexidine Gluconate Cloth  6 each Topical Daily  . clonazepam  0.5 mg Per Tube Daily   And  . clonazepam  1 mg Per Tube QHS  . dexamethasone (DECADRON) injection  6 mg Intravenous Q24H  . enoxaparin (LOVENOX) injection  50 mg Subcutaneous Q24H  . HYDROmorphone HCl  2 mg Oral Q6H  . lamoTRIgine  200 mg Per Tube QHS  .  mouth rinse  15 mL Mouth Rinse 10 times per day  . Naldemedine Tosylate  0.2 mg Per Tube QHS  . pantoprazole (PROTONIX) IV  40 mg Intravenous Q24H  . pregabalin  200 mg Oral Daily  . rOPINIRole  0.75 mg Per Tube QHS  . rosuvastatin  20 mg Per Tube Daily  . sertraline  100 mg Per Tube Daily  . zinc sulfate  220 mg Oral Daily   Continuous Infusions: . fentaNYL infusion INTRAVENOUS 325 mcg/hr (01/14/20 1505)  . midazolam 9 mg/hr (01/14/20 1505)  . remdesivir 100 mg in NS 100 mL Stopped (01/14/20 1031)  . vecuronium (NORCURON) infusion 0.8 mcg/kg/min (01/14/20 1505)   PRN Meds:.fentaNYL, midazolam, ondansetron **OR** ondansetron (ZOFRAN) IV    Interim history/subjective:    Ventilating ok 0n 9cc /kg, pH 7.3. Po2 40. Partalyzed and proned. O2 sats in high 90s after this.   Objective   Blood pressure (!) 151/61, pulse (!) 58, temperature 98.2 F (36.8 C), resp. rate (!) 35, height 5' (1.524 m), weight 108 kg, SpO2 97 %.    Vent Mode: PRVC FiO2 (%):  [100 %] 100 % Set Rate:  [28 bmp-35 bmp] 35 bmp Vt Set:  [400 mL-450 mL] 400 mL PEEP:  [14 cmH20-16 cmH20] 16 cmH20 Plateau Pressure:  [23 GQQ76-19 cmH20] 34 cmH20   Intake/Output Summary (Last 24 hours) at 01/14/2020 1520 Last data filed at 01/14/2020 1505 Gross per 24 hour  Intake 2025.46 ml  Output 950 ml  Net 1075.46 ml   Filed Weights   01/02/2020 0731  Weight: 108 kg    Examination: General: tmax 98.2 / sedated on vent HENT: oral et in place Lungs: bilateral distant insp crackles, no exp wheeze  Cardiovascular:  RRR no s3  Abdomen: obese, soft benign Extremities: warm s C C or E  Neuro: sedated, intubated    Labs and Studies personally Reviewed     Resolved Hospital Problem list      Assessment & Plan:   acute hypoxemic resp failure due to severe ARDS in setting of Cov19 pna with first symptoms around 01/03/20 unvaccinated/ rx is mainly supportive with limit to what we can offer - P:F ratio 40 100% PEEP 14 -Increase PEEP to 16, paralyze, prone - sats imrpoving -Decrease Vt to 360 cc (8 cc/kg) -Remdesivir -Baricitinib -Dexamethasone D2  Best practice:  Diet: npo Pain/Anxiety/Delirium protocol (if indicated): midaz, fentanyl drip VAP protocol (if indicated): per protocol DVT prophylaxis: hep GI prophylaxis: ppi Glucose control: SSI Mobility: bed rest Code Status: DNR Family Communication: updated husband Disposition: ICU  Labs   CBC: Recent Labs  Lab 01/10/20 1926 01/12/2020 0750 01/14/20 0233  WBC 2.3* 2.6* 2.5*  NEUTROABS 1.7 2.0 1.4*  HGB 13.2 13.5 12.4  HCT 40.3 40.1 38.8  MCV 90.6 88.3 90.9  PLT 105* 120* 130*    Basic Metabolic Panel: Recent Labs  Lab  01/10/20 1926 01/16/2020 0750 12/28/2019 2035 01/14/20 0233  NA 133* 136 140 143  K 3.2* 2.7* 3.4* 3.6  CL 97* 99 107 106  CO2 25 21* 23 26  GLUCOSE 125* 142* 154* 128*  BUN _0 CREATININE 0.66 0.79 0.54 0.66  CALCIUM 8.2* 8.1* 8.1* 8.0*  MG  --   --   --  2.3   GFR: Estimated Creatinine Clearance: 91.6 mL/min (by C-G formula based on SCr of 0.66 mg/dL). Recent Labs  Lab 01/10/20 1926 01/24/2020 0750 01/14/2020 0932 01/14/20 0233  PROCALCITON  --  0.25  --   --  WBC 2.3* 2.6*  --  2.5*  LATICACIDVEN  --  2.6* 1.4  --     Liver Function Tests: Recent Labs  Lab 01/10/20 1926 12/27/2019 0750 01/12/2020 2035 01/14/20 0233  AST 40 53* 46* 42*  ALT _0 ALKPHOS 53 54 54 52  BILITOT 0.8 1.1 0.9 0.8  PROT 7.4 6.8 6.3* 5.9*  ALBUMIN 3.9 3.4* 3.2* 3.1*   No results for input(s): LIPASE, AMYLASE in the last 168 hours. No results for input(s): AMMONIA in the last 168 hours.  ABG    Component Value Date/Time   PHART 7.341 (L) 01/14/2020 0615   PCO2ART 57.0 (H) 01/14/2020 0615   PO2ART 44.3 (L) 01/14/2020 0615   HCO3 29.8 (H) 01/14/2020 0615   TCO2 14.6 10/18/2015 1726   ACIDBASEDEF 3.5 (H) 01/19/2020 2035   O2SAT 77.8 01/14/2020 0615     Coagulation Profile: No results for input(s): INR, PROTIME in the last 168 hours.  Cardiac Enzymes: No results for input(s): CKTOTAL, CKMB, CKMBINDEX, TROPONINI in the last 168 hours.  HbA1C: Hgb A1c MFr Bld  Date/Time Value Ref Range Status  09/24/2018 11:37 AM 5.8 4.6 - 6.5 % Final    Comment:    Glycemic Control Guidelines for People with Diabetes:Non Diabetic:  <6%Goal of Therapy: <7%Additional Action Suggested:  >8%   03/23/2014 11:06 AM 5.9 (H) <5.7 % Final    Comment:    (NOTE)                                                                       According to the ADA Clinical Practice Recommendations for 2011, when HbA1c is used as a screening test:  >=6.5%   Diagnostic of Diabetes Mellitus           (if  abnormal result is confirmed) 5.7-6.4%   Increased risk of developing Diabetes Mellitus References:Diagnosis and Classification of Diabetes Mellitus,Diabetes JQZE,0923,30(QTMAU 1):S62-S69 and Standards of Medical Care in         Diabetes - 2011,Diabetes [id number],5456,25 (Suppl 1):S11-S61.     CBG: Recent Labs  Lab 12/28/2019 2035  GLUCAP 133*    Review of Systems:   To [facility] covid-19 bed   Past Medical History  She,  has a past medical history of Abnormal CT of thoracic spine (09/12/2018), Allergic rhinitis, Arthritis, Asthma, Bipolar 1 disorder ([place]), Chronic pain syndrome, Colitis due to Clostridium difficile (2001), COPD (chronic obstructive pulmonary disease) ([place]), Depression, Fibromyalgia syndrome, GAD (generalized anxiety disorder), GERD (gastroesophageal reflux disease), Hepatic hemangioma, Hepatic steatosis (06/2016), History of Salmonella gastroenteritis, Hoarseness, Hyperlipidemia, Hypertension, IBS (irritable bowel syndrome), Iron deficiency anemia (05/2014), Lumbar spondylosis, Microscopic hematuria, Migraine headache, Morbid obesity ([place]), Multiple lipomas (09/2016), Nocturnal hypoxia (09/18/2017), Obesity hypoventilation syndrome ([place]), Pain in both lower legs (2018-2019), Peripheral edema (Fall 2018), Polycystic ovarian syndrome, Pruritic dermatitis (2015/2016), PTSD (post-traumatic stress disorder), Raynaud's phenomenon (2019), Rectal cancer ([place]), Right shoulder pain (2017), Vitamin D deficiency, Weakness of both lower extremities (2020-2021), and Wheezing.   Surgical History    Past Surgical History:  Procedure Laterality Date  . ABI's complete Bilateral 09/30/2017   Normal ABIs.  Great toe pressures adequate for wound healing.  Femoral artery waveforms triphasic (normal).  . CARDIOVASCULAR STRESS TEST  03/24/14  Lexscan MIBI: mild anterior ischemia?  Cardiac CT angiogram recommended/done.  . CHOLECYSTECTOMY    . COLONOSCOPY  05/05/2007   adenoma  . coronary CT angio  03/29/14    Two vessel dz/moderate stenosis of mid LAD and proximal RCA; cath recommended.  Marland Kitchen DILATION AND CURETTAGE OF UTERUS     for vaginal bleeding  . EVENT MONITOR  04/2019   No signif abnormality (Dr. Domenic Polite)  . JOINT REPLACEMENT    . laryngoscopy  02/11/2019   (Done b/c of hoarseness and hx of wheezing"Reinke's edema of vocal folds, right vocal cord leukoplakia-->plan for bx of leukoplakia->NEEDS TO QUIT SMOKING!  . LEFT HEART CATHETERIZATION WITH CORONARY ANGIOGRAM N/A 03/31/2014   Normal coronaries, EF 45-80%, diastolic dysfunction.  Procedure: LEFT HEART CATHETERIZATION WITH CORONARY ANGIOGRAM;  Surgeon: Sinclair Grooms, MD;  Location: [facility] CATH LAB;  Service: Cardiovascular;  Laterality: N/A;  . LOWER EXT VENOUS DOPPLERS Bilateral 02/08/2017   NEG for DVT (bilat)  . NCS/EMG  10/03/2017   S1 radiculopathy bilat  . Overnight oximetry testing  09/2017   qualified for oxygen supplementation during sleep  . PFTs  04/2016   No sign of COPD or asthma; FEV 1 worse after albut.  . rectal mss  age 38   High-grade rectal adenoma removed from rectum   . TRANSTHORACIC ECHOCARDIOGRAM  03/23/14; 10/21/17; 05/12/19   2015 Normal.  2019: mild LVH, EF 65-70%, study technically inadequate to assess diastolic function, valves were fine, wall motion normal. 2021 EF 60-65%-->all normal.     Social History   reports that she has quit smoking. Her smoking use included cigarettes. She has a 18.00 pack-year smoking history. She has never used smokeless tobacco. She reports that she does not drink alcohol and does not use drugs.   Family History   Her family history includes Anxiety disorder in her mother; Bipolar disorder in her son; Cancer in her father; Colon cancer in her maternal grandfather; Colon cancer (age of onset: 17) in her father; Depression in her mother; Diabetes in her mother; Heart attack in her mother; Heart disease in her mother; Hyperlipidemia in her mother; Hypertension in her mother; Mental  illness in her mother; Thyroid disease in her mother. There is no history of Stroke.   Allergies Allergies  Allergen Reactions  . Contrast Media [Iodinated Diagnostic Agents] Anaphylaxis    Needs 13-hour prep  . Shellfish Allergy Anaphylaxis  . Codeine Nausea And Vomiting and Other (See Comments)    Hallucinations, Also sees people that are not there   . Furosemide Rash    Rash, soB.  Marland Kitchen Anoro Ellipta [Umeclidinium-Vilanterol] Other (See Comments)    Pt states it caused blisters.  . Flovent Diskus [Fluticasone Propionate (Inhal)]     headache  . Ivermectin Nausea And Vomiting    N/V and rash  . Other   . Spiriva Handihaler [Tiotropium Bromide Monohydrate] Other (See Comments)    Severe headaches  . Symbicort [Budesonide-Formoterol Fumarate]     headache  . Zofran [Ondansetron]     Makes her sick. Takes only phenergan  . Betadine [Povidone Iodine] Rash  . Latex Hives  . Lipitor [Atorvastatin] Itching  . Permethrin Itching  . Stiolto Respimat [Tiotropium Bromide-Olodaterol] Other (See Comments)    Mouth ulcers  . Tape Dermatitis    Paper tape and clear  . Trelegy Ellipta [Fluticasone-Umeclidin-Vilant] Other (See Comments)    headaches     Home Medications  Prior to Admission medications   Medication Sig Start Date  End Date Taking? Authorizing Provider  albuterol (PROVENTIL) (2.5 MG/3ML) 0.083% nebulizer solution Take 3 mLs (2.5 mg total) by nebulization every 6 (six) hours as needed for wheezing or shortness of breath. 08/27/18 01/04/2020 Yes McGowen, Adrian Blackwater, MD  albuterol (VENTOLIN HFA) 108 (90 Base) MCG/ACT inhaler INHALE 1 TO 2 PUFFS BY MOUTH EVERY 4 HOURS AS NEEDED Patient taking differently: Inhale 1-2 puffs into the lungs every 4 (four) hours as needed for wheezing or shortness of breath.  01/08/20  Yes Mannam, Hart Robinsons, MD  aspirin 81 MG chewable tablet Chew 1 tablet (81 mg total) by mouth daily. 09/06/12  Yes Ripley Fraise, MD  azelastine (ASTELIN) 0.1 % nasal spray  Place 2 sprays into both nostrils 2 (two) times daily. 07/26/17  Yes McGowen, Adrian Blackwater, MD  busPIRone (BUSPAR) 15 MG tablet Take 1 tablet (15 mg total) by mouth 2 (two) times daily. 10/19/19  Yes Nevada Crane, MD  clonazePAM (KLONOPIN) 0.5 MG tablet TAKE ONE TABLET BY MOUTH DURING THE DAY AS NEEDED AND 2 TABLETS AT BEDTIME Patient taking differently: Take 0.5-1 mg by mouth See admin instructions. 1 tablet during the day if needed and 2 tablets at bedtime 12/09/19  Yes McGowen, Adrian Blackwater, MD  cyclobenzaprine (FLEXERIL) 10 MG tablet TAKE ONE TABLET BY MOUTH EVERY 6 HOURS AS NEEDED (SPASM RELATED TO HEADACHES) Patient taking differently: Take 10 mg by mouth every 6 (six) hours as needed (spasm related to headaches).  07/10/16  Yes McGowen, Adrian Blackwater, MD  EQ ALLERGY RELIEF, CETIRIZINE, 10 MG tablet Take 1 tablet by mouth once daily Patient taking differently: Take 10 mg by mouth daily.  10/08/19  Yes McGowen, Adrian Blackwater, MD  fluticasone (FLONASE) 50 MCG/ACT nasal spray Place 2 sprays into both nostrils daily as needed for allergies.    Yes [provider]  lamoTRIgine (LAMICTAL) 100 MG tablet Take 2 tablets at bedtime Patient taking differently: Take 200 mg by mouth at bedtime.  12/17/19  Yes Nevada Crane, MD  losartan (COZAAR) 50 MG tablet Take 1 tablet by mouth once daily Patient taking differently: Take 50 mg by mouth daily.  07/01/19  Yes McGowen, Adrian Blackwater, MD  meloxicam (MOBIC) 15 MG tablet Take 15 mg by mouth every evening.  01/01/18  Yes [provider]  metoCLOPramide (REGLAN) 10 MG tablet Take 1 tablet (10 mg total) by mouth every 8 (eight) hours as needed for nausea or vomiting. 01/10/20  Yes Sherwood Gambler, MD  metoprolol tartrate (LOPRESSOR) 50 MG tablet Take 1 tablet by mouth twice daily Patient taking differently: Take 50 mg by mouth 2 (two) times daily.  01/07/20  Yes McGowen, Adrian Blackwater, MD  oxyCODONE-acetaminophen (PERCOCET) 10-325 MG tablet Take 1 tablet by mouth every 8 (eight)  hours as needed for pain.    Yes [provider]  pantoprazole (PROTONIX) 40 MG tablet Take 1 tablet (40 mg total) by mouth daily. 10/16/19  Yes McGowen, Adrian Blackwater, MD  Potassium (POTASSIMIN PO) Take 2 tablets by mouth at bedtime.   Yes [provider]  potassium chloride SA (KLOR-CON) 20 MEQ tablet Take 1 tablet (20 mEq total) by mouth daily. 01/10/20  Yes Sherwood Gambler, MD  pregabalin (LYRICA) 200 MG capsule Take 200 mg by mouth daily.  01/28/19  Yes [provider]  promethazine (PHENERGAN) 25 MG suppository Place 1 suppository (25 mg total) rectally every 6 (six) hours as needed for nausea or vomiting. 01/10/20  Yes Sherwood Gambler, MD  promethazine (PHENERGAN) 25 MG tablet TAKE 1  TABLET BY MOUTH EVERY 6 HOURS AS NEEDED FOR NAUSEA Patient taking differently: Take 25 mg by mouth every 6 (six) hours as needed for nausea.  10/16/19  Yes McGowen, Adrian Blackwater, MD  rOPINIRole (REQUIP) 0.25 MG tablet TAKE 3 TABLETS BY MOUTH AT BEDTIME Patient taking differently: Take 0.75 mg by mouth at bedtime.  01/07/20  Yes McGowen, Adrian Blackwater, MD  rosuvastatin (CRESTOR) 20 MG tablet Take 1 tablet by mouth once daily Patient taking differently: Take 20 mg by mouth daily.  11/05/19  Yes McGowen, Adrian Blackwater, MD  sertraline (ZOLOFT) 100 MG tablet Take 1 tablet by mouth once daily Patient taking differently: Take 100 mg by mouth daily.  01/07/20  Yes Nevada Crane, MD  topiramate (TOPAMAX) 50 MG tablet Take 50 mg by mouth as needed (anxiety).  06/29/19  Yes [provider]  traZODone (DESYREL) 150 MG tablet Take 1 tablet (150 mg total) by mouth at bedtime. 12/17/19  Yes Nevada Crane, MD  Naldemedine Tosylate (SYMPROIC) 0.2 MG TABS Take 0.2 mg by mouth at bedtime.     [provider]  nitroGLYCERIN (NITROSTAT) 0.4 MG SL tablet DISSOLVE ONE TABLET UNDER THE TONGUE EVERY 5 MINUTES AS NEEDED FOR CHEST PAIN.  DO NOT EXCEED A TOTAL OF 3 DOSES IN 15 MINUTES Patient taking differently: Place  0.4 mg under the tongue every 5 (five) minutes as needed for chest pain.  11/13/18   McGowen, Adrian Blackwater, MD  citalopram (CELEXA) 40 MG tablet Take 20 mg by mouth at bedtime.   06/18/11  [provider]  omeprazole (PRILOSEC) 20 MG capsule Take 40 mg by mouth daily.  05/20/19  [provider]     CRITICAL CARE Performed by: Bonna Gains Paitynn Mikus   Total critical care time: 32 minutes  Critical care time was exclusive of separately billable procedures and treating other patients.  Critical care was necessary to treat or prevent imminent or life-threatening deterioration.  Critical care was time spent personally by me on the following activities: development of treatment plan with patient and/or surrogate as well as nursing, discussions with consultants, evaluation of patient's response to treatment, examination of patient, obtaining history from patient or surrogate, ordering and performing treatments and interventions, ordering and review of laboratory studies, ordering and review of radiographic studies, pulse oximetry and re-evaluation of patient's condition.

## 2020-01-14 NOTE — Progress Notes (Signed)
[facility] Progress Note Patient Name: Martha Espinoza PATIENT DOB: 07/11/1965 MRN: [id number]   Date of Service  01/14/2020  HPI/Events of Note  Pain   eICU Interventions  Plan: 1. Fentanyl 25-50 mcg IV bolus from infusion Q 1 hour PRN pain.      Intervention Category Major Interventions: Other:  Lysle Dingwall 01/14/2020, 5:53 AM

## 2020-01-15 DIAGNOSIS — J9601 Acute respiratory failure with hypoxia: Secondary | ICD-10-CM | POA: Diagnosis not present

## 2020-01-15 DIAGNOSIS — U071 COVID-19: Secondary | ICD-10-CM | POA: Diagnosis not present

## 2020-01-15 LAB — BLOOD GAS, ARTERIAL
Acid-Base Excess: 4.5 mmol/L — ABNORMAL HIGH (ref 0.0–2.0)
Acid-Base Excess: 3.6 mmol/L — ABNORMAL HIGH (ref 0.0–2.0)
Acid-Base Excess: 1.6 mmol/L (ref 0.0–2.0)
Bicarbonate: 31.5 mmol/L — ABNORMAL HIGH (ref 20.0–28.0)
Bicarbonate: 32.9 mmol/L — ABNORMAL HIGH (ref 20.0–28.0)
Bicarbonate: 29.3 mmol/L — ABNORMAL HIGH (ref 20.0–28.0)
Drawn by: 560031
Drawn by: 560031
FIO2: 100
FIO2: 100
FIO2: 100
MECHVT: 360 mL
MECHVT: 300 mL
O2 Saturation: 89.8 %
O2 Saturation: 97.5 %
O2 Saturation: 99.4 %
PEEP: 16 cmH2O
Patient temperature: 37
Patient temperature: 98.7
Patient temperature: 98.6
RATE: 35 resp/min
RATE: 35 resp/min
pCO2 arterial: 62.4 mmHg — ABNORMAL HIGH (ref 32.0–48.0)
pCO2 arterial: 81.3 mmHg (ref 32.0–48.0)
pCO2 arterial: 67.8 mmHg (ref 32.0–48.0)
pH, Arterial: 7.325 — ABNORMAL LOW (ref 7.350–7.450)
pH, Arterial: 7.231 — ABNORMAL LOW (ref 7.350–7.450)
pH, Arterial: 7.259 — ABNORMAL LOW (ref 7.350–7.450)
pO2, Arterial: 60.4 mmHg — ABNORMAL LOW (ref 83.0–108.0)
pO2, Arterial: 105 mmHg (ref 83.0–108.0)
pO2, Arterial: 171 mmHg — ABNORMAL HIGH (ref 83.0–108.0)

## 2020-01-15 LAB — GLUCOSE, CAPILLARY
Glucose-Capillary: 100 mg/dL — ABNORMAL HIGH (ref 70–99)
Glucose-Capillary: 104 mg/dL — ABNORMAL HIGH (ref 70–99)
Glucose-Capillary: 108 mg/dL — ABNORMAL HIGH (ref 70–99)
Glucose-Capillary: 118 mg/dL — ABNORMAL HIGH (ref 70–99)
Glucose-Capillary: 133 mg/dL — ABNORMAL HIGH (ref 70–99)
Glucose-Capillary: 74 mg/dL (ref 70–99)
Glucose-Capillary: 96 mg/dL (ref 70–99)

## 2020-01-15 LAB — COMPREHENSIVE METABOLIC PANEL
ALT: 15 U/L (ref 0–44)
AST: 36 U/L (ref 15–41)
Albumin: 3 g/dL — ABNORMAL LOW (ref 3.5–5.0)
Alkaline Phosphatase: 57 U/L (ref 38–126)
Anion gap: 10 (ref 5–15)
BUN: 19 mg/dL (ref 6–20)
CO2: 29 mmol/L (ref 22–32)
Calcium: 8.1 mg/dL — ABNORMAL LOW (ref 8.9–10.3)
Chloride: 106 mmol/L (ref 98–111)
Creatinine, Ser: 0.51 mg/dL (ref 0.44–1.00)
GFR, Estimated: >60 mL/min (ref >60)
Glucose, Bld: 103 mg/dL — ABNORMAL HIGH (ref 70–99)
Potassium: 4.1 mmol/L (ref 3.5–5.1)
Sodium: 145 mmol/L (ref 135–145)
Total Bilirubin: 0.4 mg/dL (ref 0.3–1.2)
Total Protein: 5.7 g/dL — ABNORMAL LOW (ref 6.5–8.1)

## 2020-01-15 LAB — CBC WITH DIFFERENTIAL/PLATELET
Abs Immature Granulocytes: 0.02 10*3/uL (ref 0.00–0.07)
Basophils Absolute: 0 10*3/uL (ref 0.0–0.1)
Basophils Relative: 0 %
Eosinophils Absolute: 0 10*3/uL (ref 0.0–0.5)
Eosinophils Relative: 0 %
HCT: 36.7 % (ref 36.0–46.0)
Hemoglobin: 11.6 g/dL — ABNORMAL LOW (ref 12.0–15.0)
Immature Granulocytes: 1 %
Lymphocytes Relative: 33 %
Lymphs Abs: 1.2 10*3/uL (ref 0.7–4.0)
MCH: 29.4 pg (ref 26.0–34.0)
MCHC: 31.6 g/dL (ref 30.0–36.0)
MCV: 93.1 fL (ref 80.0–100.0)
Monocytes Absolute: 0.2 10*3/uL (ref 0.1–1.0)
Monocytes Relative: 6 %
Neutro Abs: 2.2 10*3/uL (ref 1.7–7.7)
Neutrophils Relative %: 60 %
Platelets: 125 10*3/uL — ABNORMAL LOW (ref 150–400)
RBC: 3.94 MIL/uL (ref 3.87–5.11)
RDW: 15.3 % (ref 11.5–15.5)
WBC: 3.6 10*3/uL — ABNORMAL LOW (ref 4.0–10.5)
nRBC: 0 % (ref 0.0–0.2)

## 2020-01-15 LAB — FERRITIN: Ferritin: 3522 ng/mL — ABNORMAL HIGH (ref 11–307)

## 2020-01-15 LAB — MAGNESIUM: Magnesium: 2.6 mg/dL — ABNORMAL HIGH (ref 1.7–2.4)

## 2020-01-15 LAB — D-DIMER, QUANTITATIVE: D-Dimer, Quant: 7.54 ug/mL-FEU — ABNORMAL HIGH (ref 0.00–0.50)

## 2020-01-15 LAB — C-REACTIVE PROTEIN: CRP: 9.4 mg/dL — ABNORMAL HIGH (ref <1.0)

## 2020-01-15 MED ORDER — HYDROMORPHONE HCL 1 MG/ML PO LIQD
2.0000 mg | Freq: Four times a day (QID) | ORAL | Status: DC
  Administered 2020-01-15 – 2020-01-18 (×10): 2 mg
  Filled 2020-01-15 (×9): qty 2

## 2020-01-15 MED ORDER — ZINC SULFATE 220 (50 ZN) MG PO CAPS
220.0000 mg | ORAL_CAPSULE | Freq: Every day | ORAL | Status: DC
  Administered 2020-01-16 – 2020-01-17 (×2): 220 mg
  Filled 2020-01-15 (×2): qty 1

## 2020-01-15 MED ORDER — SODIUM CHLORIDE 0.9% FLUSH
10.0000 mL | Freq: Two times a day (BID) | INTRAVENOUS | Status: DC
  Administered 2020-01-15: 10 mL
  Administered 2020-01-15: 20 mL
  Administered 2020-01-16: 15 mL
  Administered 2020-01-16 – 2020-01-17 (×3): 10 mL

## 2020-01-15 MED ORDER — PANTOPRAZOLE SODIUM 40 MG PO PACK
40.0000 mg | PACK | Freq: Every day | ORAL | Status: DC
  Administered 2020-01-15 – 2020-01-17 (×3): 40 mg
  Filled 2020-01-15 (×3): qty 20

## 2020-01-15 MED ORDER — SODIUM CHLORIDE 0.9% FLUSH: 10.0000 mL | INTRAVENOUS | Status: DC | PRN

## 2020-01-15 NOTE — Progress Notes (Signed)
NAME:  Martha Espinoza, MRN:  [id number], DOB:  [birth year], LOS: 2 ADMISSION DATE:  01/15/2020, CONSULTATION DATE:  10/20 REFERRING MD:  EDP/ [facility], CHIEF COMPLAINT:  resp distress in covid 19 pna   Brief History   45 yowfwith AB quit smoking 6 week PTA on prn home 02 per PCP  with acute sob/cough  01/03/11 then home test pos for covid 01/05/20 and severely hypoxemic on arrival and by pm 10/20  high wob/ anxiety with sats in 54s and pt opted for ventilation, at least short term, p initially declining ET and  PCCM asked to evaluate at that point.   History of present illness   53 y.o. female with medical history of hypertension, hyperlipidemia, iron deficiency, COPD, bipolar disorder, chronic pain syndrome, peripheral neuropathy, hepatic steatosis presenting with 2 to 3 days of progressive shortness of breath and nonproductive cough.  The patient initially presented to the emergency department on 01/10/2020 after she had a positive Covid home test that she states that she administered to herself on 01/05/2020.  On her initial ED visit, the patient had complained of a headache and nonproductive cough with fevers.  She was treated symptomatically and discharged home in stable condition.  She returns to the ED on the morning of 12/25/2019 with worsening shortness of breath, nonproductive cough, and bifrontal headache.  She denies any fevers, chills, vomiting, diarrhea, abdominal pain, dysuria, hematuria.  She denies any frank hemoptysis.  The patient is not vaccinated for COVID-19.  Upon EMS arrival, the patient was noted to have oxygen saturations of 50% on her usual 3 L at home.  She states that she normally wears 3 L at nighttime but has been using it 24/7 since her previous ED visit on 01/10/2020.  She was placed on nonrebreather with improvement up to 77%. In the emergency department, the patient was afebrile with soft blood pressures with systolic blood pressures in the upper 90s and low 100s.  She was  placed on heated high flow nasal cannula at 45 L and 100% FiO2 with a nonrebreather.  Her oxygen saturation remained in the mid 70s and progressively declined to mid 50's way too agitated to attempt bipap s sedation so was intubated by EDP that point  Past Medical History  HBP Bipolar  Chronic pain syndrme Hepatic steatosis  AB with pfts  10.2020 by Mannam  c/w restriction with very low ERV/ no obst  Significant Hospital Events      Consults:  PCCM  Procedures:  Oral ET 10/20 >>>  Significant Diagnostic Tests:    Micro Data:  BC x 2  10/20   ID Rx:  Remdesovir  10/20 >>> Baricitinib       10/20 >>>   Scheduled Meds: . artificial tears  1 application Both Eyes E4M  . vitamin C  500 mg Per Tube Daily  . aspirin  81 mg Per Tube Daily  . baricitinib  4 mg Per Tube Daily  . busPIRone  15 mg Per Tube BID  . chlorhexidine gluconate (MEDLINE KIT)  15 mL Mouth Rinse BID  . Chlorhexidine Gluconate Cloth  6 each Topical Daily  . clonazepam  0.5 mg Per Tube Daily   And  . clonazepam  1 mg Per Tube QHS  . dexamethasone (DECADRON) injection  6 mg Intravenous Q24H  . enoxaparin (LOVENOX) injection  50 mg Subcutaneous Q24H  . HYDROmorphone HCl  2 mg Oral Q6H  . insulin aspart  0-15 Units Subcutaneous Q4H  .  lamoTRIgine  200 mg Per Tube QHS  . mouth rinse  15 mL Mouth Rinse 10 times per day  . Naldemedine Tosylate  0.2 mg Per Tube QHS  . pantoprazole (PROTONIX) IV  40 mg Intravenous Q24H  . pregabalin  200 mg Oral Daily  . rOPINIRole  0.75 mg Per Tube QHS  . rosuvastatin  20 mg Per Tube Daily  . sertraline  100 mg Per Tube Daily  . zinc sulfate  220 mg Oral Daily   Continuous Infusions: . fentaNYL infusion INTRAVENOUS 400 mcg/hr (01/15/20 0600)  . midazolam 8 mg/hr (01/15/20 0833)  . remdesivir 100 mg in NS 100 mL Stopped (01/14/20 1031)  . vecuronium (NORCURON) infusion Stopped (01/14/20 1804)   PRN Meds:.fentaNYL, midazolam, ondansetron **OR** ondansetron (ZOFRAN) IV     Interim history/subjective:  Ventilating ok on 8 cc/kg, reduced to 7 cc/ kg (300 cc) this am. BPS ok. Po2 doubled proned. Supinated early AM, PO2 60. Ventilating ok. Labs stable.  Objective   Blood pressure (!) 121/59, pulse 66, temperature 98.4 F (36.9 C), resp. rate (!) 31, height 5' (1.524 m), weight 108 kg, SpO2 97 %.    Vent Mode: PRVC FiO2 (%):  [100 %] 100 % Set Rate:  [35 bmp] 35 bmp Vt Set:  [360 mL-400 mL] 360 mL PEEP:  [16 cmH20] 16 cmH20 Plateau Pressure:  [30 cmH20-35 cmH20] 33 cmH20   Intake/Output Summary (Last 24 hours) at 01/15/2020 0901 Last data filed at 01/15/2020 0600 Gross per 24 hour  Intake 1365.8 ml  Output 575 ml  Net 790.8 ml   Filed Weights   01/11/2020 0731  Weight: 108 kg    Examination: General: sedated, intubated HENT: oral et in place Lungs: bilateral coarse ventialted sounds, no exp wheeze  Cardiovascular:  RRR, no murmur Abdomen: obese, soft benign Extremities: warm, no edema Neuro: sedated, intubated    Labs and Studies personally Reviewed     Resolved Hospital Problem list      Assessment & Plan:   Acute hypoxemic resp failure due to severe ARDS in setting of Cov19 pna Proned with imrpoved pO2 40 to 90, ventilation ok, pH > 7.3 on Vt 360, RR 35 - P:F ratio 60 100% PEEP 16 -Plan to prone again this afternoon -Decrease Vt to 300 cc (7 cc/kg) -Remdesivir -Baricitinib -Dexamethasone D3 -PICC Ordered  Best practice:  Diet: Tube feeds start 10/22 Pain/Anxiety/Delirium protocol (if indicated): midaz, fentanyl drip VAP protocol (if indicated): per protocol DVT prophylaxis: hep GI prophylaxis: ppi Glucose control: SSI Mobility: bed rest Code Status: DNR Family Communication: updated husband Disposition: ICU  Labs   CBC: Recent Labs  Lab 01/10/20 1926 01/17/2020 0750 01/14/20 0233 01/15/20 0238  WBC 2.3* 2.6* 2.5* 3.6*  NEUTROABS 1.7 2.0 1.4* 2.2  HGB 13.2 13.5 12.4 11.6*  HCT 40.3 40.1 38.8 36.7  MCV 90.6  88.3 90.9 93.1  PLT 105* 120* 130* 125*    Basic Metabolic Panel: Recent Labs  Lab 01/10/20 1926 01/07/2020 0750 12/29/2019 2035 01/14/20 0233 01/15/20 0238  NA 133* 136 140 143 145  K 3.2* 2.7* 3.4* 3.6 4.1  CL 97* 99 107 106 106  CO2 25 21* _0 GLUCOSE 125* 142* 154* 128* 103*  BUN _1 CREATININE 0.66 0.79 0.54 0.66 0.51  CALCIUM 8.2* 8.1* 8.1* 8.0* 8.1*  MG  --   --   --  2.3 2.6*   GFR: Estimated Creatinine Clearance: 91.6 mL/min (by C-G formula based  on SCr of 0.51 mg/dL). Recent Labs  Lab 01/10/20 1926 01/01/2020 0750 12/30/2019 0932 01/14/20 0233 01/15/20 0238  PROCALCITON  --  0.25  --   --   --   WBC 2.3* 2.6*  --  2.5* 3.6*  LATICACIDVEN  --  2.6* 1.4  --   --     Liver Function Tests: Recent Labs  Lab 01/10/20 1926 12/28/2019 0750 01/07/2020 2035 01/14/20 0233 01/15/20 0238  AST 40 53* 46* 42* 36  ALT _0 ALKPHOS 53 54 54 52 57  BILITOT 0.8 1.1 0.9 0.8 0.4  PROT 7.4 6.8 6.3* 5.9* 5.7*  ALBUMIN 3.9 3.4* 3.2* 3.1* 3.0*   No results for input(s): LIPASE, AMYLASE in the last 168 hours. No results for input(s): AMMONIA in the last 168 hours.  ABG    Component Value Date/Time   PHART 7.325 (L) 01/15/2020 0730   PCO2ART 62.4 (H) 01/15/2020 0730   PO2ART 60.4 (L) 01/15/2020 0730   HCO3 31.5 (H) 01/15/2020 0730   TCO2 14.6 10/18/2015 1726   ACIDBASEDEF 3.5 (H) 12/31/2019 2035   O2SAT 89.8 01/15/2020 0730     Coagulation Profile: No results for input(s): INR, PROTIME in the last 168 hours.  Cardiac Enzymes: No results for input(s): CKTOTAL, CKMB, CKMBINDEX, TROPONINI in the last 168 hours.  HbA1C: Hgb A1c MFr Bld  Date/Time Value Ref Range Status  09/24/2018 11:37 AM 5.8 4.6 - 6.5 % Final    Comment:    Glycemic Control Guidelines for People with Diabetes:Non Diabetic:  <6%Goal of Therapy: <7%Additional Action Suggested:  >8%   03/23/2014 11:06 AM 5.9 (H) <5.7 % Final    Comment:    (NOTE)                                                                        According to the ADA Clinical Practice Recommendations for 2011, when HbA1c is used as a screening test:  >=6.5%   Diagnostic of Diabetes Mellitus           (if abnormal result is confirmed) 5.7-6.4%   Increased risk of developing Diabetes Mellitus References:Diagnosis and Classification of Diabetes Mellitus,Diabetes [id number],9983,38(SNKNL 1):S62-S69 and Standards of Medical Care in         Diabetes - 2011,Diabetes [id number],3419,37 (Suppl 1):S11-S61.     CBG: Recent Labs  Lab 01/14/20 1642 01/14/20 2032 01/15/20 0002 01/15/20 0414 01/15/20 0811  GLUCAP 126* 125* 100* 96 74    Review of Systems:   To [facility] covid-19 bed   Past Medical History  She,  has a past medical history of Abnormal CT of thoracic spine (09/12/2018), Allergic rhinitis, Arthritis, Asthma, Bipolar 1 disorder ([place]), Chronic pain syndrome, Colitis due to Clostridium difficile (2001), COPD (chronic obstructive pulmonary disease) ([place]), Depression, Fibromyalgia syndrome, GAD (generalized anxiety disorder), GERD (gastroesophageal reflux disease), Hepatic hemangioma, Hepatic steatosis (06/2016), History of Salmonella gastroenteritis, Hoarseness, Hyperlipidemia, Hypertension, IBS (irritable bowel syndrome), Iron deficiency anemia (05/2014), Lumbar spondylosis, Microscopic hematuria, Migraine headache, Morbid obesity ([place]), Multiple lipomas (09/2016), Nocturnal hypoxia (09/18/2017), Obesity hypoventilation syndrome ([place]), Pain in both lower legs (2018-2019), Peripheral edema (Fall 2018), Polycystic ovarian syndrome, Pruritic dermatitis (2015/2016), PTSD (post-traumatic stress disorder), Raynaud's phenomenon (2019), Rectal cancer ([place]), Right shoulder  pain (2017), Vitamin D deficiency, Weakness of both lower extremities (2020-2021), and Wheezing.   Surgical History    Past Surgical History:  Procedure Laterality Date  . ABI's complete Bilateral 09/30/2017   Normal ABIs.  Great toe pressures  adequate for wound healing.  Femoral artery waveforms triphasic (normal).  . CARDIOVASCULAR STRESS TEST  03/24/14   Lexscan MIBI: mild anterior ischemia?  Cardiac CT angiogram recommended/done.  . CHOLECYSTECTOMY    . COLONOSCOPY  05/05/2007   adenoma  . coronary CT angio  03/29/14   Two vessel dz/moderate stenosis of mid LAD and proximal RCA; cath recommended.  Marland Kitchen DILATION AND CURETTAGE OF UTERUS     for vaginal bleeding  . EVENT MONITOR  04/2019   No signif abnormality (Dr. Domenic Polite)  . JOINT REPLACEMENT    . laryngoscopy  02/11/2019   (Done b/c of hoarseness and hx of wheezing"Reinke's edema of vocal folds, right vocal cord leukoplakia-->plan for bx of leukoplakia->NEEDS TO QUIT SMOKING!  . LEFT HEART CATHETERIZATION WITH CORONARY ANGIOGRAM N/A 03/31/2014   Normal coronaries, EF 41-66%, diastolic dysfunction.  Procedure: LEFT HEART CATHETERIZATION WITH CORONARY ANGIOGRAM;  Surgeon: Sinclair Grooms, MD;  Location[facility] C[facility] LAB;  Service: Cardiovascular;  Laterality: N/A;  . LOWER EXT VENOUS DOPPLERS Bilateral 02/08/2017   NEG for DVT (bilat)  . NCS/EMG  10/03/2017   S1 radiculopathy bilat  . Overnight oximetry testing  09/2017   qualified for oxygen supplementation during sleep  . PFTs  04/2016   No sign of COPD or asthma; FEV 1 worse after albut.  . rectal mss  age 38   High-grade rectal adenoma removed from rectum   . TRANSTHORACIC ECHOCARDIOGRAM  03/23/14; 10/21/17; 05/12/19   2015 Normal.  2019: mild LVH, EF 65-70%, study technically inadequate to assess diastolic function, valves were fine, wall motion normal. 2021 EF 60-65%-->all normal.     Social History   reports that she has quit smoking. Her smoking use included cigarettes. She has a 18.00 pack-year smoking history. She has never used smokeless tobacco. She reports that she does not drink alcohol and does not use drugs.   Family History   Her family history includes Anxiety disorder in her mother; Bipolar disorder in her son;  Cancer in her father; Colon cancer in her maternal grandfather; Colon cancer (age of onset: 26) in her father; Depression in her mother; Diabetes in her mother; Heart attack in her mother; Heart disease in her mother; Hyperlipidemia in her mother; Hypertension in her mother; Mental illness in her mother; Thyroid disease in her mother. There is no history of Stroke.   Allergies Allergies  Allergen Reactions  . Contrast Media [Iodinated Diagnostic Agents] Anaphylaxis    Needs 13-hour prep  . Shellfish Allergy Anaphylaxis  . Codeine Nausea And Vomiting and Other (See Comments)    Hallucinations, Also sees people that are not there   . Furosemide Rash    Rash, soB.  Marland Kitchen Anoro Ellipta [Umeclidinium-Vilanterol] Other (See Comments)    Pt states it caused blisters.  . Flovent Diskus [Fluticasone Propionate (Inhal)]     headache  . Ivermectin Nausea And Vomiting    N/V and rash  . Other   . Spiriva Handihaler [Tiotropium Bromide Monohydrate] Other (See Comments)    Severe headaches  . Symbicort [Budesonide-Formoterol Fumarate]     headache  . Zofran [Ondansetron]     Makes her sick. Takes only phenergan  . Betadine [Povidone Iodine] Rash  . Latex Hives  . Lipitor [  Atorvastatin] Itching  . Permethrin Itching  . Stiolto Respimat [Tiotropium Bromide-Olodaterol] Other (See Comments)    Mouth ulcers  . Tape Dermatitis    Paper tape and clear  . Trelegy Ellipta [Fluticasone-Umeclidin-Vilant] Other (See Comments)    headaches     Home Medications  Prior to Admission medications   Medication Sig Start Date End Date Taking? Authorizing Provider  albuterol (PROVENTIL) (2.5 MG/3ML) 0.083% nebulizer solution Take 3 mLs (2.5 mg total) by nebulization every 6 (six) hours as needed for wheezing or shortness of breath. 08/27/18 01/01/2020 Yes McGowen, Adrian Blackwater, MD  albuterol (VENTOLIN HFA) 108 (90 Base) MCG/ACT inhaler INHALE 1 TO 2 PUFFS BY MOUTH EVERY 4 HOURS AS NEEDED Patient taking differently:  Inhale 1-2 puffs into the lungs every 4 (four) hours as needed for wheezing or shortness of breath.  01/08/20  Yes Mannam, Hart Robinsons, MD  aspirin 81 MG chewable tablet Chew 1 tablet (81 mg total) by mouth daily. 09/06/12  Yes Ripley Fraise, MD  azelastine (ASTELIN) 0.1 % nasal spray Place 2 sprays into both nostrils 2 (two) times daily. 07/26/17  Yes McGowen, Adrian Blackwater, MD  busPIRone (BUSPAR) 15 MG tablet Take 1 tablet (15 mg total) by mouth 2 (two) times daily. 10/19/19  Yes Nevada Crane, MD  clonazePAM (KLONOPIN) 0.5 MG tablet TAKE ONE TABLET BY MOUTH DURING THE DAY AS NEEDED AND 2 TABLETS AT BEDTIME Patient taking differently: Take 0.5-1 mg by mouth See admin instructions. 1 tablet during the day if needed and 2 tablets at bedtime 12/09/19  Yes McGowen, Adrian Blackwater, MD  cyclobenzaprine (FLEXERIL) 10 MG tablet TAKE ONE TABLET BY MOUTH EVERY 6 HOURS AS NEEDED (SPASM RELATED TO HEADACHES) Patient taking differently: Take 10 mg by mouth every 6 (six) hours as needed (spasm related to headaches).  07/10/16  Yes McGowen, Adrian Blackwater, MD  EQ ALLERGY RELIEF, CETIRIZINE, 10 MG tablet Take 1 tablet by mouth once daily Patient taking differently: Take 10 mg by mouth daily.  10/08/19  Yes McGowen, Adrian Blackwater, MD  fluticasone (FLONASE) 50 MCG/ACT nasal spray Place 2 sprays into both nostrils daily as needed for allergies.    Yes [provider]  lamoTRIgine (LAMICTAL) 100 MG tablet Take 2 tablets at bedtime Patient taking differently: Take 200 mg by mouth at bedtime.  12/17/19  Yes Nevada Crane, MD  losartan (COZAAR) 50 MG tablet Take 1 tablet by mouth once daily Patient taking differently: Take 50 mg by mouth daily.  07/01/19  Yes McGowen, Adrian Blackwater, MD  meloxicam (MOBIC) 15 MG tablet Take 15 mg by mouth every evening.  01/01/18  Yes [provider]  metoCLOPramide (REGLAN) 10 MG tablet Take 1 tablet (10 mg total) by mouth every 8 (eight) hours as needed for nausea or vomiting. 01/10/20  Yes Sherwood Gambler,  MD  metoprolol tartrate (LOPRESSOR) 50 MG tablet Take 1 tablet by mouth twice daily Patient taking differently: Take 50 mg by mouth 2 (two) times daily.  01/07/20  Yes McGowen, Adrian Blackwater, MD  oxyCODONE-acetaminophen (PERCOCET) 10-325 MG tablet Take 1 tablet by mouth every 8 (eight) hours as needed for pain.    Yes [provider]  pantoprazole (PROTONIX) 40 MG tablet Take 1 tablet (40 mg total) by mouth daily. 10/16/19  Yes McGowen, Adrian Blackwater, MD  Potassium (POTASSIMIN PO) Take 2 tablets by mouth at bedtime.   Yes [provider]  potassium chloride SA (KLOR-CON) 20 MEQ tablet Take 1 tablet (20 mEq total) by mouth daily. 01/10/20  Yes Sherwood Gambler, MD  pregabalin (LYRICA) 200 MG capsule Take 200 mg by mouth daily.  01/28/19  Yes [provider]  promethazine (PHENERGAN) 25 MG suppository Place 1 suppository (25 mg total) rectally every 6 (six) hours as needed for nausea or vomiting. 01/10/20  Yes Sherwood Gambler, MD  promethazine (PHENERGAN) 25 MG tablet TAKE 1 TABLET BY MOUTH EVERY 6 HOURS AS NEEDED FOR NAUSEA Patient taking differently: Take 25 mg by mouth every 6 (six) hours as needed for nausea.  10/16/19  Yes McGowen, Adrian Blackwater, MD  rOPINIRole (REQUIP) 0.25 MG tablet TAKE 3 TABLETS BY MOUTH AT BEDTIME Patient taking differently: Take 0.75 mg by mouth at bedtime.  01/07/20  Yes McGowen, Adrian Blackwater, MD  rosuvastatin (CRESTOR) 20 MG tablet Take 1 tablet by mouth once daily Patient taking differently: Take 20 mg by mouth daily.  11/05/19  Yes McGowen, Adrian Blackwater, MD  sertraline (ZOLOFT) 100 MG tablet Take 1 tablet by mouth once daily Patient taking differently: Take 100 mg by mouth daily.  01/07/20  Yes Nevada Crane, MD  topiramate (TOPAMAX) 50 MG tablet Take 50 mg by mouth as needed (anxiety).  06/29/19  Yes [provider]  traZODone (DESYREL) 150 MG tablet Take 1 tablet (150 mg total) by mouth at bedtime. 12/17/19  Yes Nevada Crane, MD  Naldemedine Tosylate  (SYMPROIC) 0.2 MG TABS Take 0.2 mg by mouth at bedtime.     [provider]  nitroGLYCERIN (NITROSTAT) 0.4 MG SL tablet DISSOLVE ONE TABLET UNDER THE TONGUE EVERY 5 MINUTES AS NEEDED FOR CHEST PAIN.  DO NOT EXCEED A TOTAL OF 3 DOSES IN 15 MINUTES Patient taking differently: Place 0.4 mg under the tongue every 5 (five) minutes as needed for chest pain.  11/13/18   McGowen, Adrian Blackwater, MD  citalopram (CELEXA) 40 MG tablet Take 20 mg by mouth at bedtime.   06/18/11  [provider]  omeprazole (PRILOSEC) 20 MG capsule Take 40 mg by mouth daily.  05/20/19  [provider]     CRITICAL CARE Performed by: Bonna Gains Bradely Rudin   Total critical care time: 35 minutes  Critical care time was exclusive of separately billable procedures and treating other patients.  Critical care was necessary to treat or prevent imminent or life-threatening deterioration.  Critical care was time spent personally by me on the following activities: development of treatment plan with patient and/or surrogate as well as nursing, discussions with consultants, evaluation of patient's response to treatment, examination of patient, obtaining history from patient or surrogate, ordering and performing treatments and interventions, ordering and review of laboratory studies, ordering and review of radiographic studies, pulse oximetry and re-evaluation of patient's condition.

## 2020-01-15 NOTE — Procedures (Signed)
Arterial Catheter Insertion Procedure Note  Martha Espinoza  [id number]  Sep 12, 1965  Date:01/15/20  Time:3:27 PM    Provider Performing: Otelia Sergeant    Procedure: Insertion of Arterial Line [id number]) with US guidance (25638)   Indication(s) Blood pressure monitoring and/or need for frequent ABGs  Consent Risks of the procedure as well as the alternatives and risks of each were explained to the patient and/or caregiver.  Consent for the procedure was obtained and is signed in the bedside chart  Anesthesia None   Time Out Verified patient identification, verified procedure, site/side was marked, verified correct patient position, special equipment/implants available, medications/allergies/relevant history reviewed, required imaging and test results available.   Sterile Technique Maximal sterile technique including full sterile barrier drape, hand hygiene, sterile gown, sterile gloves, mask, hair covering, sterile ultrasound probe cover (if used).   Procedure Description Area of catheter insertion was cleaned with chlorhexidine and draped in sterile fashion. Without real-time ultrasound guidance an arterial catheter was placed into the left radial artery.  Appropriate arterial tracings confirmed on monitor.     Complications/Tolerance None; patient tolerated the procedure well.   EBL Minimal    Specimen(s) None

## 2020-01-15 NOTE — Progress Notes (Signed)
0800 CBG was 74. Parameters not met to implement hypoglycemia protocol. CCM notified; will start tube feeds today as directed by dietician. No further interventions ordered at this time. This RN will continue to carefully monitor pt.

## 2020-01-15 NOTE — Progress Notes (Signed)
Tidal volume on patient's vent reduced by this RN to 300 per RT and CCM orders after AM ABG. This RN will continue to monitor patient's O2 and WOB.

## 2020-01-15 NOTE — Progress Notes (Signed)
Pt turned from prone positioning to supine.  ETT remained stable throughout move and Pt remained on ventilator.  Cloth tape exchanged out for tube holder.

## 2020-01-15 NOTE — Progress Notes (Signed)
Pt proned at 1624 by RN staff and RT. Pt experienced no complications; vital signs stable. This RN will continue to carefully monitor pt.

## 2020-01-15 NOTE — Progress Notes (Signed)
Pts. head repositioned to facing Left along with both upper extremities repositioned with RN, no complications.

## 2020-01-15 NOTE — Progress Notes (Signed)
Peripherally Inserted Central Catheter Placement  The IV Nurse has discussed with the patient and/or persons authorized to consent for the patient, the purpose of this procedure and the potential benefits and risks involved with this procedure.  The benefits include less needle sticks, lab draws from the catheter, and the patient may be discharged home with the catheter. Risks include, but not limited to, infection, bleeding, blood clot (thrombus formation), and puncture of an artery; nerve damage and irregular heartbeat and possibility to perform a PICC exchange if needed/ordered by physician.  Alternatives to this procedure were also discussed.  Bard Power PICC patient education guide, fact sheet on infection prevention and patient information card has been provided to patient /or left at bedside.    PICC Placement Documentation  PICC Triple Lumen 55/97/41 PICC Right Basilic 39 cm 1 cm (Active)  Exposed Catheter (cm) 1 cm 01/15/20 1221  Site Assessment Clean;Dry;Intact 01/15/20 1221  Lumen #1 Status Flushed;Blood return noted;Saline locked 01/15/20 1221  Lumen #2 Status Flushed;Saline locked;Blood return noted 01/15/20 1221  Lumen #3 Status Flushed;Blood return noted;Saline locked 01/15/20 1221  Dressing Type Transparent;Securing device 01/15/20 1221  Dressing Status Clean;Dry;Intact 01/15/20 1221  Antimicrobial disc in place? Yes 01/15/20 1221  Safety Lock Not Applicable 63/84/53 6468  Dressing Change Due 01/22/20 01/15/20 1221       Martha Espinoza 01/15/2020, 12:23 PM

## 2020-01-15 NOTE — Progress Notes (Signed)
2200 hrs;01/15/2020-Scheduled Arterial Line ABG drawn @ 2255, labelled/sent to Lab with requisition.

## 2020-01-15 NOTE — Progress Notes (Signed)
Pt proned @1624  with no complications noted at this time. Vitals stable at this time, RT will continue to monitor.

## 2020-01-15 NOTE — Progress Notes (Signed)
CRITICAL VALUE ALERT  Critical Value: pCO2 81.3  Date & Time Notied:  01/15/20, 1800  Provider Notified: Dr. Silas Flood   Orders Received/Actions taken: Continue to prone pt at this time.   *(MD also reviewed other ABG components.)

## 2020-01-16 DIAGNOSIS — J9601 Acute respiratory failure with hypoxia: Secondary | ICD-10-CM | POA: Diagnosis not present

## 2020-01-16 DIAGNOSIS — U071 COVID-19: Secondary | ICD-10-CM | POA: Diagnosis not present

## 2020-01-16 LAB — COMPREHENSIVE METABOLIC PANEL
ALT: 15 U/L (ref 0–44)
AST: 25 U/L (ref 15–41)
Albumin: 2.6 g/dL — ABNORMAL LOW (ref 3.5–5.0)
Alkaline Phosphatase: 53 U/L (ref 38–126)
Anion gap: 5 (ref 5–15)
BUN: 16 mg/dL (ref 6–20)
CO2: 32 mmol/L (ref 22–32)
Calcium: 7.6 mg/dL — ABNORMAL LOW (ref 8.9–10.3)
Chloride: 107 mmol/L (ref 98–111)
Creatinine, Ser: 0.45 mg/dL (ref 0.44–1.00)
GFR, Estimated: >60 mL/min (ref >60)
Glucose, Bld: 87 mg/dL (ref 70–99)
Potassium: 3.9 mmol/L (ref 3.5–5.1)
Sodium: 144 mmol/L (ref 135–145)
Total Bilirubin: 0.8 mg/dL (ref 0.3–1.2)
Total Protein: 5.2 g/dL — ABNORMAL LOW (ref 6.5–8.1)

## 2020-01-16 LAB — BLOOD GAS, ARTERIAL
Acid-Base Excess: 4.9 mmol/L — ABNORMAL HIGH (ref 0.0–2.0)
Bicarbonate: 34.9 mmol/L — ABNORMAL HIGH (ref 20.0–28.0)
Drawn by: 308601
FIO2: 70
O2 Saturation: 86.6 %
PEEP: 16 cmH2O
Patient temperature: 98.6
RATE: 35 resp/min
pCO2 arterial: 85.4 mmHg (ref 32.0–48.0)
pH, Arterial: 7.235 — ABNORMAL LOW (ref 7.350–7.450)
pO2, Arterial: 57.2 mmHg — ABNORMAL LOW (ref 83.0–108.0)

## 2020-01-16 LAB — GLUCOSE, CAPILLARY
Glucose-Capillary: 136 mg/dL — ABNORMAL HIGH (ref 70–99)
Glucose-Capillary: 139 mg/dL — ABNORMAL HIGH (ref 70–99)
Glucose-Capillary: 151 mg/dL — ABNORMAL HIGH (ref 70–99)
Glucose-Capillary: 171 mg/dL — ABNORMAL HIGH (ref 70–99)
Glucose-Capillary: 73 mg/dL (ref 70–99)
Glucose-Capillary: 91 mg/dL (ref 70–99)

## 2020-01-16 LAB — CBC WITH DIFFERENTIAL/PLATELET
Abs Immature Granulocytes: 0.01 10*3/uL (ref 0.00–0.07)
Basophils Absolute: 0 10*3/uL (ref 0.0–0.1)
Basophils Relative: 0 %
Eosinophils Absolute: 0 10*3/uL (ref 0.0–0.5)
Eosinophils Relative: 0 %
HCT: 34.7 % — ABNORMAL LOW (ref 36.0–46.0)
Hemoglobin: 10.8 g/dL — ABNORMAL LOW (ref 12.0–15.0)
Immature Granulocytes: 0 %
Lymphocytes Relative: 21 %
Lymphs Abs: 0.7 10*3/uL (ref 0.7–4.0)
MCH: 29.9 pg (ref 26.0–34.0)
MCHC: 31.1 g/dL (ref 30.0–36.0)
MCV: 96.1 fL (ref 80.0–100.0)
Monocytes Absolute: 0.2 10*3/uL (ref 0.1–1.0)
Monocytes Relative: 5 %
Neutro Abs: 2.5 10*3/uL (ref 1.7–7.7)
Neutrophils Relative %: 74 %
Platelets: 147 10*3/uL — ABNORMAL LOW (ref 150–400)
RBC: 3.61 MIL/uL — ABNORMAL LOW (ref 3.87–5.11)
RDW: 15.5 % (ref 11.5–15.5)
WBC: 3.5 10*3/uL — ABNORMAL LOW (ref 4.0–10.5)
nRBC: 0 % (ref 0.0–0.2)

## 2020-01-16 LAB — C-REACTIVE PROTEIN: CRP: 8.7 mg/dL — ABNORMAL HIGH (ref <1.0)

## 2020-01-16 LAB — D-DIMER, QUANTITATIVE: D-Dimer, Quant: 10.12 ug/mL-FEU — ABNORMAL HIGH (ref 0.00–0.50)

## 2020-01-16 LAB — MAGNESIUM: Magnesium: 2.4 mg/dL (ref 1.7–2.4)

## 2020-01-16 LAB — FERRITIN: Ferritin: 1451 ng/mL — ABNORMAL HIGH (ref 11–307)

## 2020-01-16 MED ORDER — VITAL AF 1.2 CAL PO LIQD
1000.0000 mL | ORAL | Status: DC
  Administered 2020-01-16 (×2): 1000 mL

## 2020-01-16 MED ORDER — CLONIDINE HCL 0.1 MG PO TABS
0.1000 mg | ORAL_TABLET | Freq: Three times a day (TID) | ORAL | Status: DC
  Administered 2020-01-16: 0.1 mg
  Filled 2020-01-16 (×3): qty 1

## 2020-01-16 MED ORDER — PROSOURCE TF PO LIQD
90.0000 mL | Freq: Two times a day (BID) | ORAL | Status: DC
  Administered 2020-01-16 – 2020-01-17 (×4): 90 mL
  Filled 2020-01-16 (×4): qty 90

## 2020-01-16 MED ORDER — ACETAMINOPHEN 500 MG PO TABS
500.0000 mg | ORAL_TABLET | Freq: Four times a day (QID) | ORAL | Status: AC | PRN
  Administered 2020-01-16 – 2020-01-17 (×2): 500 mg via NASOGASTRIC
  Filled 2020-01-16 (×2): qty 1

## 2020-01-16 NOTE — Progress Notes (Signed)
NAME:  Martha Espinoza, MRN:  [id number], DOB:  1965/04/12, LOS: 3 ADMISSION DATE:  01/05/2020, CONSULTATION DATE:  10/20 REFERRING MD:  EDP/ [facility], CHIEF COMPLAINT:  resp distress in covid 19 pna   Brief History   81 yowf with AB quit smoking 6 week PTA on prn home 02 per PCP  with acute sob/cough  01/03/11 then home test pos for covid 01/05/20 and severely hypoxemic on arrival and by pm 10/20  high wob/ anxiety with sats in 1s and pt opted for ventilation, at least short term, p initially declining ET and  PCCM asked to evaluate at that point.   History of present illness   54 y.o. female with medical history of hypertension, hyperlipidemia, iron deficiency, COPD, bipolar disorder, chronic pain syndrome, peripheral neuropathy, hepatic steatosis presenting with 2 to 3 days of progressive shortness of breath and nonproductive cough.  The patient initially presented to the emergency department on 01/10/2020 after she had a positive Covid home test that she states that she administered to herself on 01/05/2020.  On her initial ED visit, the patient had complained of a headache and nonproductive cough with fevers.  She was treated symptomatically and discharged home in stable condition.  She returns to the ED on the morning of 01/23/2020 with worsening shortness of breath, nonproductive cough, and bifrontal headache.  She denies any fevers, chills, vomiting, diarrhea, abdominal pain, dysuria, hematuria.  She denies any frank hemoptysis.  The patient is not vaccinated for COVID-19.  Upon EMS arrival, the patient was noted to have oxygen saturations of 50% on her usual 3 L at home.  She states that she normally wears 3 L at nighttime but has been using it 24/7 since her previous ED visit on 01/10/2020.  She was placed on nonrebreather with improvement up to 77%. In the emergency department, the patient was afebrile with soft blood pressures with systolic blood pressures in the upper 90s and low 100s.  She was  placed on heated high flow nasal cannula at 45 L and 100% FiO2 with a nonrebreather.  Her oxygen saturation remained in the mid 70s and progressively declined to mid 50's way too agitated to attempt bipap s sedation so was intubated by EDP that point  Past Medical History  HBP Bipolar  Chronic pain syndrme Hepatic steatosis  AB with pfts  10.2020 by Mannam  c/w restriction with very low ERV/ no obst  Significant Hospital Events      Consults:  PCCM  Procedures:  Oral ET 10/20 >>> BC x 2  10/20 >>> MRSA   10/20 >>> R [facility]    10/20 >>>  Significant Diagnostic Tests:    Micro Data:  BC x 2  10/20   ID Rx:  Remdesovir   10/20 >>> Baricitinib       10/20 >>>   Scheduled Meds: . artificial tears  1 application Both Eyes E9M  . vitamin C  500 mg Per Tube Daily  . aspirin  81 mg Per Tube Daily  . baricitinib  4 mg Per Tube Daily  . busPIRone  15 mg Per Tube BID  . chlorhexidine gluconate (MEDLINE KIT)  15 mL Mouth Rinse BID  . Chlorhexidine Gluconate Cloth  6 each Topical Daily  . clonazepam  0.5 mg Per Tube Daily   And  . clonazepam  1 mg Per Tube QHS  . dexamethasone (DECADRON) injection  6 mg Intravenous Q24H  . enoxaparin (LOVENOX) injection  50 mg Subcutaneous Q24H  .  feeding supplement (PROSource TF)  90 mL Per Tube BID  . HYDROmorphone HCl  2 mg Per Tube Q6H  . insulin aspart  0-15 Units Subcutaneous Q4H  . lamoTRIgine  200 mg Per Tube QHS  . mouth rinse  15 mL Mouth Rinse 10 times per day  . Naldemedine Tosylate  0.2 mg Per Tube QHS  . pantoprazole sodium  40 mg Per Tube Daily  . pregabalin  200 mg Oral Daily  . rOPINIRole  0.75 mg Per Tube QHS  . rosuvastatin  20 mg Per Tube Daily  . sertraline  100 mg Per Tube Daily  . sodium chloride flush  10-40 mL Intracatheter Q12H  . zinc sulfate  220 mg Per Tube Daily   Continuous Infusions: . feeding supplement (VITAL AF 1.2 CAL) 1,000 mL (01/16/20 1044)  . fentaNYL infusion INTRAVENOUS 350 mcg/hr (01/16/20 1500)    . midazolam 4 mg/hr (01/16/20 1500)  . remdesivir 100 mg in NS 100 mL Stopped (01/16/20 1058)  . vecuronium (NORCURON) infusion 0.6 mcg/kg/min (01/16/20 1500)   PRN Meds:.fentaNYL, midazolam, sodium chloride flush    Interim history/subjective:   much better today in supine position sats 100% on fio2 1.00 > weaning down to 70% on 14 peep  Objective   Blood pressure (!) 175/65, pulse (!) 110, temperature (!) 100.8 F (38.2 C), resp. rate (!) 35, height 5' (1.524 m), weight 108 kg, SpO2 92 %. CVP:  [5 mmHg-8 mmHg] 5 mmHg  Vent Mode: PRVC FiO2 (%):  [60 %-100 %] 70 % Set Rate:  [35 bmp] 35 bmp Vt Set:  [300 mL] 300 mL PEEP:  [16 cmH20] 16 cmH20 Plateau Pressure:  [26 cmH20-30 cmH20] 30 cmH20   Intake/Output Summary (Last 24 hours) at 01/16/2020 1615 Last data filed at 01/16/2020 1500 Gross per 24 hour  Intake 1675.77 ml  Output 865 ml  Net 810.77 ml   Filed Weights   01/01/2020 0731  Weight: 108 kg    Examination: tmax 100.8 Pt sedated on vent  No jvd Oropharynx ET Neck supple Lungs with a few scattered exp > insp rhonchi bilaterally/ not air trapping on present settings RRR no s3 or or sign murmur Abd obese with nl excursion  Extr warm with no edema or clubbing noted Neuro  Sedated            Resolved Hospital Problem list      Assessment & Plan:   Acute hypoxemic resp failure due to severe ARDS in setting of Cov19 pna   - prone per protocol-Decreased Vt to 300 cc (7 cc/kg)/ permissive hypercapnea  -Remdesivir -Baricitinib -Dexamethasone      Best practice:  Diet: Tube feeds    Pain/Anxiety/Delirium protocol (if indicated): midaz, fentanyl drip VAP protocol (if indicated): per protocol DVT prophylaxis: Lovenox  GI prophylaxis: ppi Glucose control: SSI Mobility: bed rest Code Status: DNR Family Communication:  Son contacted today / updated Disposition: ICU  Labs   CBC: Recent Labs  Lab 01/10/20 1926 01/14/2020 0750 01/14/20 0233  01/15/20 0238 01/16/20 0500  WBC 2.3* 2.6* 2.5* 3.6* 3.5*  NEUTROABS 1.7 2.0 1.4* 2.2 2.5  HGB 13.2 13.5 12.4 11.6* 10.8*  HCT 40.3 40.1 38.8 36.7 34.7*  MCV 90.6 88.3 90.9 93.1 96.1  PLT 105* 120* 130* 125* 147*    Basic Metabolic Panel: Recent Labs  Lab 12/28/2019 0750 01/03/2020 2035 01/14/20 0233 01/15/20 0238 01/16/20 0500  NA 136 140 143 145 144  K 2.7* 3.4* 3.6 4.1 3.9  CL  99 107 106 106 107  CO2 21* _0 32  GLUCOSE 142* 154* 128* 103* 87  BUN _1 CREATININE 0.79 0.54 0.66 0.51 0.45  CALCIUM 8.1* 8.1* 8.0* 8.1* 7.6*  MG  --   --  2.3 2.6* 2.4   GFR: Estimated Creatinine Clearance: 91.6 mL/min (by C-G formula based on SCr of 0.45 mg/dL). Recent Labs  Lab 01/12/2020 0750 01/01/2020 0932 01/14/20 0233 01/15/20 0238 01/16/20 0500  PROCALCITON 0.25  --   --   --   --   WBC 2.6*  --  2.5* 3.6* 3.5*  LATICACIDVEN 2.6* 1.4  --   --   --     Liver Function Tests: Recent Labs  Lab 01/24/2020 0750 01/15/2020 2035 01/14/20 0233 01/15/20 0238 01/16/20 0500  AST 53* 46* 42* 36 25  ALT _2 ALKPHOS 54 54 52 57 53  BILITOT 1.1 0.9 0.8 0.4 0.8  PROT 6.8 6.3* 5.9* 5.7* 5.2*  ALBUMIN 3.4* 3.2* 3.1* 3.0* 2.6*   No results for input(s): LIPASE, AMYLASE in the last 168 hours. No results for input(s): AMMONIA in the last 168 hours.  ABG    Component Value Date/Time   PHART 7.259 (L) 01/15/2020 2255   PCO2ART 67.8 (HH) 01/15/2020 2255   PO2ART 171 (H) 01/15/2020 2255   HCO3 29.3 (H) 01/15/2020 2255   TCO2 14.6 10/18/2015 1726   ACIDBASEDEF 3.5 (H) 01/05/2020 2035   O2SAT 99.4 01/15/2020 2255     Coagulation Profile: No results for input(s): INR, PROTIME in the last 168 hours.  Cardiac Enzymes: No results for input(s): CKTOTAL, CKMB, CKMBINDEX, TROPONINI in the last 168 hours.  HbA1C: Hgb A1c MFr Bld  Date/Time Value Ref Range Status  09/24/2018 11:37 AM 5.8 4.6 - 6.5 % Final    Comment:    Glycemic Control Guidelines for People with  Diabetes:Non Diabetic:  <6%Goal of Therapy: <7%Additional Action Suggested:  >8%   03/23/2014 11:06 AM 5.9 (H) <5.7 % Final    Comment:    (NOTE)                                                                       According to the ADA Clinical Practice Recommendations for 2011, when HbA1c is used as a screening test:  >=6.5%   Diagnostic of Diabetes Mellitus           (if abnormal result is confirmed) 5.7-6.4%   Increased risk of developing Diabetes Mellitus References:Diagnosis and Classification of Diabetes Mellitus,Diabetes JJOA,4166,06(TKZSW 1):S62-S69 and Standards of Medical Care in         Diabetes - 2011,Diabetes FUXN,2355,73 (Suppl 1):S11-S61.     CBG: Recent Labs  Lab 01/15/20 2004 01/15/20 2323 01/16/20 0434 01/16/20 0736 01/16/20 1147  GLUCAP 118* 104* 73 91 136*    The patient is critically ill with multiple organ systems failure and requires high complexity decision making for assessment and support, frequent evaluation and titration of therapies, application of advanced monitoring technologies and extensive interpretation of multiple databases. Critical Care Time devoted to patient care services described in this note is 35 minutes.    Christinia Gully, MD Pulmonary and [place] [facility] [phone]  After 7:00 pm call Elink  [phone]

## 2020-01-16 NOTE — Progress Notes (Signed)
[place] Progress Note Patient Name: PROVIDENCE STIVERS DOB: [birth year] MRN: [id number]   Date of Service  01/16/2020  HPI/Events of Note  CBG 73 mg %, patient is NPO but has  Nutrition recommendations for starting enteral nutrition.  eICU Interventions  Enteral nutrition ordered. Zofran discontinued from the [facility] as it is listed as an allergy.        Kerry Kass Abimbola Aki 01/16/2020, 6:24 AM

## 2020-01-16 NOTE — Progress Notes (Signed)
RT NOTE: RT, RNx3 and NT turned patient supine @1000 . No complications. RT will continue to monitor.

## 2020-01-16 NOTE — Progress Notes (Signed)
[place] Progress Note Patient Name: Martha Espinoza DOB: 07/04/1965 MRN: [id number]   Date of Service  01/16/2020  HPI/Events of Note  P/F on ABG improving.  On Vent: 70%, PEEP 16, rate 35, Vt 300. 92%.  Proning improves her P/F ratio as per notes and hand off.   eICU Interventions  Go for prone as per protocol.   Discussed with bed side RN.      Intervention Category Intermediate Interventions: Other:;Respiratory distress - evaluation and management  Elmer Sow 01/16/2020, 8:11 PM

## 2020-01-16 NOTE — Progress Notes (Addendum)
Pt rotated into supine position by RT and RN staff at 1000. No complications observed. O2 sat currently 99%. This RN will continue to carefully monitor pt.

## 2020-01-16 NOTE — Progress Notes (Signed)
[place] Progress Note Patient Name: Martha Espinoza DOB: 24-Oct-1965 MRN: [id number]   Date of Service  01/16/2020  HPI/Events of Note  temp 101.7, BC done on 10/20, needs tylenol order and cooling blanket LFTS and Cr. ok  on vent with OG and PICC  eICU Interventions  Blood cx from 20 th none so far.   Tylenol via Ng ordered prn.      Intervention Category Intermediate Interventions: Infection - evaluation and management  Elmer Sow 01/16/2020, 8:01 PM

## 2020-01-16 NOTE — Progress Notes (Signed)
Per Dr. Melvyn Novas, FiO2 decreased to 60% based off last ABG. Dr. Melvyn Novas declined repeat ABG after vent setting adjustment. Orders to recheck at 1900; will prone tonight if directed by protocol.

## 2020-01-16 NOTE — Progress Notes (Signed)
Art line not functioning properly; zeroed, flushed and repositioned numerous times by nightshift and dayshift RNs. RT notified, and will also try to reposition.

## 2020-01-17 ENCOUNTER — Inpatient Hospital Stay (HOSPITAL_COMMUNITY): Payer: Commercial Managed Care - PPO

## 2020-01-17 DIAGNOSIS — J9601 Acute respiratory failure with hypoxia: Secondary | ICD-10-CM | POA: Diagnosis not present

## 2020-01-17 DIAGNOSIS — U071 COVID-19: Secondary | ICD-10-CM | POA: Diagnosis not present

## 2020-01-17 DIAGNOSIS — J9383 Other pneumothorax: Secondary | ICD-10-CM | POA: Diagnosis not present

## 2020-01-17 LAB — COMPREHENSIVE METABOLIC PANEL
ALT: 18 U/L (ref 0–44)
AST: 28 U/L (ref 15–41)
Albumin: 2.4 g/dL — ABNORMAL LOW (ref 3.5–5.0)
Alkaline Phosphatase: 62 U/L (ref 38–126)
Anion gap: 11 (ref 5–15)
BUN: 20 mg/dL (ref 6–20)
CO2: 33 mmol/L — ABNORMAL HIGH (ref 22–32)
Calcium: 8 mg/dL — ABNORMAL LOW (ref 8.9–10.3)
Chloride: 101 mmol/L (ref 98–111)
Creatinine, Ser: 0.63 mg/dL (ref 0.44–1.00)
GFR, Estimated: >60 mL/min (ref >60)
Glucose, Bld: 142 mg/dL — ABNORMAL HIGH (ref 70–99)
Potassium: 3 mmol/L — ABNORMAL LOW (ref 3.5–5.1)
Sodium: 145 mmol/L (ref 135–145)
Total Bilirubin: 1.7 mg/dL — ABNORMAL HIGH (ref 0.3–1.2)
Total Protein: 5.3 g/dL — ABNORMAL LOW (ref 6.5–8.1)

## 2020-01-17 LAB — GLUCOSE, CAPILLARY
Glucose-Capillary: 117 mg/dL — ABNORMAL HIGH (ref 70–99)
Glucose-Capillary: 133 mg/dL — ABNORMAL HIGH (ref 70–99)
Glucose-Capillary: 139 mg/dL — ABNORMAL HIGH (ref 70–99)
Glucose-Capillary: 147 mg/dL — ABNORMAL HIGH (ref 70–99)
Glucose-Capillary: 116 mg/dL — ABNORMAL HIGH (ref 70–99)

## 2020-01-17 LAB — CBC WITH DIFFERENTIAL/PLATELET
Abs Immature Granulocytes: 0.08 10*3/uL — ABNORMAL HIGH (ref 0.00–0.07)
Basophils Absolute: 0 10*3/uL (ref 0.0–0.1)
Basophils Relative: 0 %
Eosinophils Absolute: 0 10*3/uL (ref 0.0–0.5)
Eosinophils Relative: 0 %
HCT: 38.6 % (ref 36.0–46.0)
Hemoglobin: 11.5 g/dL — ABNORMAL LOW (ref 12.0–15.0)
Immature Granulocytes: 2 %
Lymphocytes Relative: 9 %
Lymphs Abs: 0.5 10*3/uL — ABNORMAL LOW (ref 0.7–4.0)
MCH: 29.3 pg (ref 26.0–34.0)
MCHC: 29.8 g/dL — ABNORMAL LOW (ref 30.0–36.0)
MCV: 98.5 fL (ref 80.0–100.0)
Monocytes Absolute: 0.2 10*3/uL (ref 0.1–1.0)
Monocytes Relative: 5 %
Neutro Abs: 4.3 10*3/uL (ref 1.7–7.7)
Neutrophils Relative %: 84 %
Platelets: 138 10*3/uL — ABNORMAL LOW (ref 150–400)
RBC: 3.92 MIL/uL (ref 3.87–5.11)
RDW: 15.3 % (ref 11.5–15.5)
WBC: 5.1 10*3/uL (ref 4.0–10.5)
nRBC: 0 % (ref 0.0–0.2)

## 2020-01-17 LAB — BLOOD GAS, VENOUS
Acid-base deficit: 2 mmol/L (ref 0.0–2.0)
Bicarbonate: 28.7 mmol/L — ABNORMAL HIGH (ref 20.0–28.0)
O2 Saturation: 62.1 %
Patient temperature: 98.6
pCO2, Ven: 86.5 mmHg (ref 44.0–60.0)
pH, Ven: 7.147 — CL (ref 7.250–7.430)
pO2, Ven: 36.7 mmHg (ref 32.0–45.0)

## 2020-01-17 LAB — C-REACTIVE PROTEIN: CRP: 22.9 mg/dL — ABNORMAL HIGH (ref <1.0)

## 2020-01-17 LAB — D-DIMER, QUANTITATIVE: D-Dimer, Quant: 18.84 ug/mL-FEU — ABNORMAL HIGH (ref 0.00–0.50)

## 2020-01-17 LAB — MAGNESIUM: Magnesium: 1.9 mg/dL (ref 1.7–2.4)

## 2020-01-17 LAB — FERRITIN: Ferritin: 911 ng/mL — ABNORMAL HIGH (ref 11–307)

## 2020-01-17 MED ORDER — NOREPINEPHRINE 4 MG/250ML-% IV SOLN
0.0000 ug/min | INTRAVENOUS | Status: DC
  Administered 2020-01-17: 2 ug/min via INTRAVENOUS
  Administered 2020-01-18: 55 ug/min via INTRAVENOUS
  Filled 2020-01-17 (×2): qty 250

## 2020-01-17 MED ORDER — VASOPRESSIN 20 UNITS/100 ML INFUSION FOR SHOCK
0.0000 [IU]/min | INTRAVENOUS | Status: DC
  Administered 2020-01-17: 0.03 [IU]/min via INTRAVENOUS
  Filled 2020-01-17 (×2): qty 100

## 2020-01-17 MED ORDER — EPINEPHRINE 1 MG/10ML IJ SOSY
PREFILLED_SYRINGE | INTRAMUSCULAR | Status: AC
  Administered 2020-01-17: 0.5 mg
  Filled 2020-01-17: qty 20

## 2020-01-17 MED ORDER — HEPARIN (PORCINE) 25000 UT/250ML-% IV SOLN: 16.0000 [IU]/kg/h | INTRAVENOUS | Status: DC

## 2020-01-17 MED ORDER — SODIUM BICARBONATE 8.4 % IV SOLN
INTRAVENOUS | Status: AC
  Administered 2020-01-17: 50 meq
  Filled 2020-01-17: qty 100

## 2020-01-17 MED ORDER — ENOXAPARIN SODIUM 60 MG/0.6ML ~~LOC~~ SOLN
0.5000 mg/kg | Freq: Two times a day (BID) | SUBCUTANEOUS | Status: DC
  Filled 2020-01-17: qty 0.55

## 2020-01-17 MED ORDER — POTASSIUM CHLORIDE 20 MEQ/15ML (10%) PO SOLN
20.0000 meq | ORAL | Status: AC
  Administered 2020-01-17 (×2): 20 meq
  Filled 2020-01-17 (×2): qty 15

## 2020-01-17 MED ORDER — CHLORHEXIDINE GLUCONATE 0.12 % MT SOLN
OROMUCOSAL | Status: AC
  Administered 2020-01-17: 15 mL via OROMUCOSAL
  Filled 2020-01-17: qty 15

## 2020-01-17 MED ORDER — MAGNESIUM SULFATE 2 GM/50ML IV SOLN
2.0000 g | Freq: Once | INTRAVENOUS | Status: AC
  Administered 2020-01-17: 2 g via INTRAVENOUS
  Filled 2020-01-17: qty 50

## 2020-01-17 MED ORDER — POTASSIUM CHLORIDE 10 MEQ/50ML IV SOLN
10.0000 meq | INTRAVENOUS | Status: AC
  Administered 2020-01-17 (×4): 10 meq via INTRAVENOUS
  Filled 2020-01-17 (×5): qty 50

## 2020-01-17 MED ORDER — NOREPINEPHRINE 4 MG/250ML-% IV SOLN
2.0000 ug/min | INTRAVENOUS | Status: DC
  Filled 2020-01-17: qty 250

## 2020-01-17 MED ORDER — ENOXAPARIN SODIUM 120 MG/0.8ML ~~LOC~~ SOLN: 1.0000 mg/kg | SUBCUTANEOUS | Status: DC

## 2020-01-17 MED ORDER — SODIUM CHLORIDE 0.9 % IV SOLN
250.0000 mL | INTRAVENOUS | Status: DC
  Administered 2020-01-17: 250 mL via INTRAVENOUS

## 2020-01-17 NOTE — Progress Notes (Signed)
Pt proned at 2339.  RT to monitor and assess as needed.

## 2020-01-17 NOTE — Progress Notes (Signed)
NAME:  Martha Espinoza, MRN:  [id number], DOB:  [birth year], LOS: 4 ADMISSION DATE:  01/05/2020, CONSULTATION DATE:  10/20 REFERRING MD:  EDP/ [facility], CHIEF COMPLAINT:  resp distress in covid 19 pna   Brief History   77 yowf with AB quit smoking 6 week PTA on prn home 02 per PCP  with acute sob/cough  01/03/11 then home test pos for covid 01/05/20 and severely hypoxemic on arrival and by pm 10/20  high wob/ anxiety with sats in 13s and pt opted for ventilation, at least short term, p initially declining ET and  PCCM asked to evaluate at that point.   History of present illness   54 y.o. female with medical history of hypertension, hyperlipidemia, iron deficiency, COPD, bipolar disorder, chronic pain syndrome, peripheral neuropathy, hepatic steatosis presenting with 2 to 3 days of progressive shortness of breath and nonproductive cough.  The patient initially presented to the emergency department on 01/10/2020 after she had a positive Covid home test that she states that she administered to herself on 01/05/2020.  On her initial ED visit, the patient had complained of a headache and nonproductive cough with fevers.  She was treated symptomatically and discharged home in stable condition.  She returns to the ED on the morning of 01/12/2020 with worsening shortness of breath, nonproductive cough, and bifrontal headache.  She denies any fevers, chills, vomiting, diarrhea, abdominal pain, dysuria, hematuria.  She denies any frank hemoptysis.  The patient is not vaccinated for COVID-19.  Upon EMS arrival, the patient was noted to have oxygen saturations of 50% on her usual 3 L at home.  She states that she normally wears 3 L at nighttime but has been using it 24/7 since her previous ED visit on 01/10/2020.  She was placed on nonrebreather with improvement up to 77%. In the emergency department, the patient was afebrile with soft blood pressures with systolic blood pressures in the upper 90s and low 100s.  She was  placed on heated high flow nasal cannula at 45 L and 100% FiO2 with a nonrebreather.  Her oxygen saturation remained in the mid 70s and progressively declined to mid 50's way too agitated to attempt bipap s sedation so was intubated by EDP that point  Past Medical History  HBP Bipolar  Chronic pain syndrme Hepatic steatosis  AB with pfts  01/13/19 by Mannam  c/w restriction with very low ERV/ no obst  Significant Hospital Events      Consults:  PCCM  Procedures:  Oral ET 10/20 >>> R [facility]    10/20 >>>  Significant Diagnostic Tests:    Micro Data:  MRSA  10/20 >>> BC x 2  10/20 >> Urine culture 10/24 >>> BC x 2  10/24 >>> ET culture 10/24 >>>  ID Rx:  Remdesovir   10/20 >>> Baricitinib       10/20 >>>   Scheduled Meds: . artificial tears  1 application Both Eyes N2D  . vitamin C  500 mg Per Tube Daily  . aspirin  81 mg Per Tube Daily  . baricitinib  4 mg Per Tube Daily  . busPIRone  15 mg Per Tube BID  . chlorhexidine gluconate (MEDLINE KIT)  15 mL Mouth Rinse BID  . Chlorhexidine Gluconate Cloth  6 each Topical Daily  . clonazepam  0.5 mg Per Tube Daily   And  . clonazepam  1 mg Per Tube QHS  . cloNIDine  0.1 mg Per Tube TID  . dexamethasone (DECADRON)  injection  6 mg Intravenous Q24H  . enoxaparin (LOVENOX) injection  50 mg Subcutaneous Q24H  . feeding supplement (PROSource TF)  90 mL Per Tube BID  . HYDROmorphone HCl  2 mg Per Tube Q6H  . insulin aspart  0-15 Units Subcutaneous Q4H  . lamoTRIgine  200 mg Per Tube QHS  . mouth rinse  15 mL Mouth Rinse 10 times per day  . Naldemedine Tosylate  0.2 mg Per Tube QHS  . pantoprazole sodium  40 mg Per Tube Daily  . potassium chloride  20 mEq Per Tube Q4H  . pregabalin  200 mg Oral Daily  . rOPINIRole  0.75 mg Per Tube QHS  . rosuvastatin  20 mg Per Tube Daily  . sertraline  100 mg Per Tube Daily  . sodium chloride flush  10-40 mL Intracatheter Q12H  . zinc sulfate  220 mg Per Tube Daily   Continuous  Infusions: . sodium chloride 10 mL/hr at 01/17/20 0400  . feeding supplement (VITAL AF 1.2 CAL) 35 mL/hr at 01/17/20 0200  . fentaNYL infusion INTRAVENOUS 300 mcg/hr (01/17/20 0400)  . magnesium sulfate bolus IVPB    . midazolam 3 mg/hr (01/17/20 0400)  . norepinephrine (LEVOPHED) Adult infusion    . potassium chloride    . remdesivir 100 mg in NS 100 mL Stopped (01/16/20 1058)  . vecuronium (NORCURON) infusion 0.2 mcg/kg/min (01/17/20 0400)   PRN Meds:.acetaminophen, fentaNYL, midazolam, sodium chloride flush    Interim history/subjective:  Placed on levophed overnight for soft bp Still req prone ventilation   Objective   Blood pressure (!) 136/45, pulse (!) 111, temperature (!) 101.1 F (38.4 C), temperature source Bladder, resp. rate (!) 35, height 5' (1.524 m), weight 108 kg, SpO2 94 %. CVP:  [6 mmHg-12 mmHg] 7 mmHg  Vent Mode: PRVC FiO2 (%):  [60 %-100 %] 60 % Set Rate:  [35 bmp] 35 bmp Vt Set:  [300 mL] 300 mL PEEP:  [16 cmH20] 16 cmH20 Plateau Pressure:  [29 cmH20-32 cmH20] 30 cmH20   Intake/Output Summary (Last 24 hours) at 01/17/2020 0737 Last data filed at 01/17/2020 0400 Gross per 24 hour  Intake 2072.34 ml  Output 1420 ml  Net 652.34 ml   Filed Weights   01/09/2020 0731  Weight: 108 kg    Examination: tmax 102.2  (new spike) Pt sedated/ paralyzed / prone  Oropharynx clear,  mucosa nl Neck supple Lungs with minimal  rhonchi bilaterally - not air trapping RRR no s3 or or sign murmur Extr warm with no edema or clubbing noted           Resolved Hospital Problem list      Assessment & Plan:   Acute hypoxemic resp failure due to severe ARDS in s1) acute hypoxemic resp failure in setting of Cov19 pna with first symptoms around 01/03/20 unvaccinated/ rx is mainly supportive with limit to what we can offer - at baseline was not truly 02 dep which gives me hope she can pull thru but her prognosis is very guarded  >>> continue prone permissive hypercapnea   -Remdesivir -Baricitinib -Dexamethasone     2) AB s/p recent smoking cessation - pfts 12/2018 s airflow obst gives some home of functional recovery here  - added albuterol   3) Hypokalemia >> replace/ monitor  - prone per protocol-Decreased Vt to 300 cc (7 cc/kg)/   4)  new fever spike 10/24  -see micro flow sheet  - consider adding coverage for HCAP after review studies  Best practice:  Diet: Tube feeds    Pain/Anxiety/Delirium protocol (if indicated): midaz, fentanyl drip VAP protocol (if indicated): per protocol DVT prophylaxis: Lovenox  GI prophylaxis: ppi Glucose control: SSI Mobility: bed rest Code Status: DNR Family Communication:  Son contacted today / updated Disposition: ICU  Labs   CBC: Recent Labs  Lab 01/01/2020 0750 01/14/20 0233 01/15/20 0238 01/16/20 0500 01/17/20 0432  WBC 2.6* 2.5* 3.6* 3.5* 5.1  NEUTROABS 2.0 1.4* 2.2 2.5 4.3  HGB 13.5 12.4 11.6* 10.8* 11.5*  HCT 40.1 38.8 36.7 34.7* 38.6  MCV 88.3 90.9 93.1 96.1 98.5  PLT 120* 130* 125* 147* 138*    Basic Metabolic Panel: Recent Labs  Lab 01/19/2020 2035 01/14/20 0233 01/15/20 0238 01/16/20 0500 01/17/20 0432  NA 140 143 145 144 145  K 3.4* 3.6 4.1 3.9 3.0*  CL 107 106 106 107 101  CO2 _0 32 33*  GLUCOSE 154* 128* 103* 87 142*  BUN _1 CREATININE 0.54 0.66 0.51 0.45 0.63  CALCIUM 8.1* 8.0* 8.1* 7.6* 8.0*  MG  --  2.3 2.6* 2.4 1.9   GFR: Estimated Creatinine Clearance: 91.6 mL/min (by C-G formula based on SCr of 0.63 mg/dL). Recent Labs  Lab 12/28/2019 0750 01/04/2020 0750 12/27/2019 0932 01/14/20 0233 01/15/20 0238 01/16/20 0500 01/17/20 0432  PROCALCITON 0.25  --   --   --   --   --   --   WBC 2.6*   < >  --  2.5* 3.6* 3.5* 5.1  LATICACIDVEN 2.6*  --  1.4  --   --   --   --    < > = values in this interval not displayed.    Liver Function Tests: Recent Labs  Lab 01/09/2020 2035 01/14/20 0233 01/15/20 0238 01/16/20 0500 01/17/20 0432  AST 46*  42* 36 25 28  ALT _2 ALKPHOS 54 52 57 53 62  BILITOT 0.9 0.8 0.4 0.8 1.7*  PROT 6.3* 5.9* 5.7* 5.2* 5.3*  ALBUMIN 3.2* 3.1* 3.0* 2.6* 2.4*   No results for input(s): LIPASE, AMYLASE in the last 168 hours. No results for input(s): AMMONIA in the last 168 hours.  ABG    Component Value Date/Time   PHART 7.235 (L) 01/16/2020 1930   PCO2ART 85.4 (HH) 01/16/2020 1930   PO2ART 57.2 (L) 01/16/2020 1930   HCO3 34.9 (H) 01/16/2020 1930   TCO2 14.6 10/18/2015 1726   ACIDBASEDEF 3.5 (H) 01/12/2020 2035   O2SAT 86.6 01/16/2020 1930     Coagulation Profile: No results for input(s): INR, PROTIME in the last 168 hours.  Cardiac Enzymes: No results for input(s): CKTOTAL, CKMB, CKMBINDEX, TROPONINI in the last 168 hours.  HbA1C: Hgb A1c MFr Bld  Date/Time Value Ref Range Status  09/24/2018 11:37 AM 5.8 4.6 - 6.5 % Final    Comment:    Glycemic Control Guidelines for People with Diabetes:Non Diabetic:  <6%Goal of Therapy: <7%Additional Action Suggested:  >8%   03/23/2014 11:06 AM 5.9 (H) <5.7 % Final    Comment:    (NOTE)  According to the ADA Clinical Practice Recommendations for 2011, when HbA1c is used as a screening test:  >=6.5%   Diagnostic of Diabetes Mellitus           (if abnormal result is confirmed) 5.7-6.4%   Increased risk of developing Diabetes Mellitus References:Diagnosis and Classification of Diabetes Mellitus,Diabetes SPZZ,8022,17(VGVSY 1):S62-S69 and Standards of Medical Care in         Diabetes - 2011,Diabetes VGCY,2824,17 (Suppl 1):S11-S61.     CBG: Recent Labs  Lab 01/16/20 1147 01/16/20 1632 01/16/20 1931 01/16/20 2346 01/17/20 0404  GLUCAP 136* 171* 151* 139* 117*       The patient is critically ill with multiple organ systems failure and requires high complexity decision making for assessment and support, frequent evaluation and titration of therapies, application of  advanced monitoring technologies and extensive interpretation of multiple databases. Critical Care Time devoted to patient care services described in this note is 35 minutes.    Christinia Gully, MD Pulmonary and [place] [phone]   After 7:00 pm call Elink  [phone]

## 2020-01-17 NOTE — Progress Notes (Signed)
[facility] ADULT ICU REPLACEMENT PROTOCOL   The patient does apply for the [facility] Adult ICU Electrolyte Replacment Protocol based on the criteria listed below:   1. Is GFR >/= 30 ml/min? Yes.    Patient's GFR today is >60 2. Is SCr </= 2? Yes.   Patient's SCr is 0.63 ml/kg/hr 3. Did SCr increase >/= 0.5 in 24 hours? No. 4. Abnormal electrolyte(s): K+3.0 Mag 1.9 5. Ordered repletion with: protocol 6. If a panic level lab has been reported, has the CCM MD in charge been notified? Yes.  .   Physician:  Dr.Mohan  Carlisle Beers 01/17/2020 6:23 AM

## 2020-01-17 NOTE — Progress Notes (Addendum)
[place] Progress Note Patient Name: Martha Espinoza DOB: Oct 21, 1965 MRN: [id number]   Date of Service  01/17/2020  HPI/Events of Note  CxR large pneumo on right.  eICU Interventions  - Dr Melvyn Novas is aware.  Bed side ICU team heading to [facility]. Meantime talked to bed side RN to keep Pig tail/thoroacotomy tray ready. Get VBG.   Camera eval done.  HR ok. MAP soft.  Might need emergency needle decompression if gets worse before team arrives to ICU.      Intervention Category Intermediate Interventions: Diagnostic test evaluation  Elmer Sow 01/17/2020, 8:02 PM

## 2020-01-17 NOTE — Progress Notes (Addendum)
[place] Progress Note Patient Name: Martha Espinoza DOB: Feb 29, 1966 MRN: [id number]   Date of Service  01/17/2020  HPI/Events of Note  Pharmacy called for Heparin gtt clarification for what diagnosis. Increasing d dimer and CRP. On Barcitinab/remdesivir. Platelet 138 K. Cr normal.   eICU Interventions  - OK to discontinue heparin for now, will fix Pneumothorax first and watch, but  Ordering higher dose Lovenox 1 mg/kg q24 hrs for VTE prophylaxis.  - if hypoxemia not better, consider CTA chest for any PE, if so need heparin gtt. Watch for worsening thrombocytopenia.     Intervention Category Intermediate Interventions: Other:  Elmer Sow 01/17/2020, 8:22 PM   22:07 CxR : pig tail cath at upper zone.  Still in tension Pneumo.  Dr Verlee Monte secure chat:( definitely has a leak. I've flushed it as well since it was briefly clogged with fibrinous garbage and her pressure is improving slowly (levo down to 30 from 50) after this but her saturation on pulse ox is still garbage (50s which I will try to confirm with abg from a-line). - would go for ICD-chest tube 22 G size and follow CxR/ABG there after. Pig tail to wall suction at 20 cm can be tried. Camera: sats still low 47%, sinus tachy. MAP 57. On levo gtt. Family been counseled by Dr Verlee Monte already.

## 2020-01-17 NOTE — Progress Notes (Signed)
Verbal consent given by husband at bedside. Time out done with Arnoldo Lenis MD for bedside chest tube placement. Patient identified with wrist band, MRN, and DOB.

## 2020-01-17 NOTE — Progress Notes (Addendum)
Called to bedside for R tension pneumothorax. Pt in prone position and too unstable to supinate. Placed 34F pigtail with air rush, brisk air leak, levophed requirement initially decreasing from 50 mcg/min to 30 mcg/min but continued to have poor pleth. Attempted to place a-line to confirm low saturation but challenging due to prone position, poor radial target however blood did appear dark red with initial flash and suspect that pulse ox saturation is likely truly very low (<70%). CXR showed persistent large R PTX with some improvement relative to prior. Discussed monitoring for continued improvement with 34F pigtail vs trial of larger chest tube with family, agreed to trial of tube thoracotomy with 35F. Saturation briefly improved to ~68% and CXR seems to show some improved lung apposition with good air leak through both tubes however vasopressor requirement still quite high, there is persistent pneumothorax and given prolonged severe hypoxia and shock with likely anoxic brain injury, her family elects to proceed with comfort measures only and will let us know when they're ready to start this process.   This patient is critically ill with multiple organ system failure; which, requires frequent high complexity decision making, assessment, support, evaluation, and titration of therapies. This was completed through the application of advanced monitoring technologies and extensive interpretation of multiple databases. During this encounter critical care time was devoted to patient care services described in this note for 60 minutes.  Walker Shadow PGY-6 Pulmonary/Critical Care

## 2020-01-17 NOTE — Progress Notes (Signed)
[facility] Progress Note Patient Name: Martha Espinoza DOB: April 13, 1965 MRN: [id number]   Date of Service  01/17/2020  HPI/Events of Note  B/P little soft  SBP 80's earlier then came up to 100's,  after proning 95/40 (57)   HR 100  sats 88     RN was wondering if you wanted a pressor,  RN is currently decreasing Vec and versed due to 0/4 TOF  Discussed with Shelton Silvas. Soft MAP. sats > 88%. Did come down post prone.   eICU Interventions  - will start low dose levophed and see sats picks up or not. - avoiding fluids to keep neutral to neg balance.      Intervention Category Intermediate Interventions: Hypotension - evaluation and management  Elmer Sow 01/17/2020, 12:03 AM

## 2020-01-17 NOTE — Progress Notes (Signed)
Assisted family with tele-visit via elink 

## 2020-01-17 NOTE — Progress Notes (Signed)
On turning pt back to supine position per protocol noted sats progressively worse down in the 60's so rec reprone immediately but this did not improve sats so reproned but still did not obtained adequated sats despite no change on exam or other vital signs   rec Repeat stat cxr prone emprical heparin Discussed limits of medical science with fm and her wishes not to prolong her care if not reversibilit reasonably likely so remains DNR.  Add:  cxr with large R PTX/ team on way for chest tube placement.   Christinia Gully, MD Pulmonary and [place] [phone]   After 7:00 pm call Elink  [phone]

## 2020-01-18 DIAGNOSIS — U071 COVID-19: Secondary | ICD-10-CM | POA: Diagnosis not present

## 2020-01-18 DIAGNOSIS — J9601 Acute respiratory failure with hypoxia: Secondary | ICD-10-CM | POA: Diagnosis not present

## 2020-01-18 DIAGNOSIS — J9383 Other pneumothorax: Secondary | ICD-10-CM

## 2020-01-18 LAB — BLOOD CULTURE ID PANEL (REFLEXED) - BCID2

## 2020-01-18 LAB — CULTURE, BLOOD (ROUTINE X 2)
Culture: NO GROWTH
Culture: NO GROWTH
Special Requests: ADEQUATE

## 2020-01-19 LAB — CULTURE, RESPIRATORY W GRAM STAIN

## 2020-01-20 ENCOUNTER — Ambulatory Visit (HOSPITAL_COMMUNITY): Payer: Commercial Managed Care - PPO | Admitting: Licensed Clinical Social Worker

## 2020-01-20 LAB — CULTURE, BLOOD (ROUTINE X 2): Special Requests: ADEQUATE

## 2020-01-21 LAB — URINE CULTURE: Culture: >=100000 — AB

## 2020-01-25 NOTE — Procedures (Signed)
Insertion of Chest Tube Procedure Note  GLENIS MUSOLF  [id number]  14-Nov-1965  Date:12-Feb-2020  Time:1:52 AM    Provider Performing: Maryjane Hurter   Procedure: Chest Tube Insertion [id number])  Indication(s) Pneumothorax  Consent Risks of the procedure as well as the alternatives and risks of each were explained to the patient and/or caregiver.  Consent for the procedure was obtained and is signed in the bedside chart  Anesthesia Topical only with 1% lidocaine    Time Out Verified patient identification, verified procedure, site/side was marked, verified correct patient position, special equipment/implants available, medications/allergies/relevant history reviewed, required imaging and test results available.   Sterile Technique Maximal sterile technique including full sterile barrier drape, hand hygiene, sterile gown, sterile gloves, mask, hair covering, sterile ultrasound probe cover (if used).   Procedure Description Ultrasound not used to identify appropriate pleural anatomy for placement and overlying skin marked. Area of placement cleaned and draped in sterile fashion.  A 14 French pigtail pleural catheter was placed into the right pleural space using Seldinger technique. Appropriate return of air was obtained.  The tube was connected to atrium and placed on -20 cm H2O wall suction.   Complications/Tolerance None; patient tolerated the procedure well. Chest X-ray is ordered to verify placement.   EBL Minimal  Specimen(s) none

## 2020-01-25 NOTE — Procedures (Signed)
Arterial Catheter Insertion Procedure Note  JENNEY BRESTER  [id number]  07-Nov-1965  Date:2020-02-02  Time:1:56 AM    Provider Performing: Maryjane Hurter    Procedure: Insertion of Arterial Line [phone]) with US guidance ([id number])   Indication(s) Blood pressure monitoring and/or need for frequent ABGs  Consent Unable to obtain consent due to emergent nature of procedure.  Anesthesia None   Time Out Verified patient identification, verified procedure, site/side was marked, verified correct patient position, special equipment/implants available, medications/allergies/relevant history reviewed, required imaging and test results available.   Sterile Technique Maximal sterile technique including full sterile barrier drape, hand hygiene, sterile gown, sterile gloves, mask, hair covering, sterile ultrasound probe cover (if used).   Procedure Description Area of catheter insertion was cleaned with chlorhexidine and draped in sterile fashion. With real-time ultrasound guidance, attempted twice to place an arterial catheter was placed into the right radial artery unsuccessfully.   Complications/Tolerance None  EBL Minimal   Specimen(s) None

## 2020-01-25 NOTE — Procedures (Signed)
Insertion of Chest Tube Procedure Note  Martha Espinoza  [id number]  29-Jan-1966  Date:02/15/20  Time:1:54 AM    Provider Performing: Maryjane Hurter   Procedure: Chest Tube Insertion [phone])  Indication(s) Pneumothorax  Consent Risks of the procedure as well as the alternatives and risks of each were explained to the patient and/or caregiver.  Consent for the procedure was obtained and is signed in the bedside chart  Anesthesia Topical only with 1% lidocaine    Time Out Verified patient identification, verified procedure, site/side was marked, verified correct patient position, special equipment/implants available, medications/allergies/relevant history reviewed, required imaging and test results available.   Sterile Technique Maximal sterile technique including full sterile barrier drape, hand hygiene, sterile gown, sterile gloves, mask, hair covering, sterile ultrasound probe cover (if used).   Procedure Description Ultrasound not used to identify appropriate pleural anatomy for placement and overlying skin marked. Area of placement cleaned and draped in sterile fashion.  A 28 French chest tube was placed into the right pleural space using Kelly dissection. Appropriate return of air was obtained.  The tube was connected to atrium and placed on -20 cm H2O wall suction.   Complications/Tolerance None; patient tolerated the procedure well. Chest X-ray is ordered to verify placement.   EBL Minimal  Specimen(s) none

## 2020-01-25 NOTE — Progress Notes (Signed)
Pt flipped to supine position per family request.  Pt comfort care per MD.

## 2020-01-25 NOTE — Progress Notes (Signed)
No heart sounds heard upon auscultation. Verified with second RN- Julio Alm. Monitor still showing some PEA- time of death 62, 2020/02/11.

## 2020-01-25 DEATH — deceased

## 2020-02-12 ENCOUNTER — Ambulatory Visit (HOSPITAL_COMMUNITY): Payer: Commercial Managed Care - PPO | Admitting: Psychiatry

## 2020-03-26 NOTE — Discharge Summary (Signed)
DEATH SUMMARY   Patient Details  Name: Martha Espinoza MRN: [id number] DOB: 05/13/1965  Admission/Discharge Information   Admit Date:  01/28/2020  Date of Death: Date of Death: Feb 02, 2020  Time of Death: Time of Death: 06/29/53  Length of Stay: 5  Referring Physician: Tammi Sou, MD   Reason(s) for Hospitalization  Acute hypoxic respiratory failure due to covid-19 Septic shock due to pneumonia and bacteremia from staph aureus, pseudomonas pneumonia Tension pneumothorax   Diagnoses  Preliminary cause of death: Multifactorial acute hypoxic respiratory failure from covid-19. Cultures, results of which were unavailable at time of death, eventually revealed likely staph aureus and pseudomonas VAP, which also likely contributed. Secondary Diagnoses (including complications and co-morbidities):  Active Problems:   Acute hypoxemic respiratory failure due to COVID-19 [facility])   Goals of care, counseling/discussion   Obesity, Class III, BMI 40-49.9 (morbid obesity) (HCC)   Chronic pain syndrome   Thrombocytopenia ([place])   Other pneumothorax   Brief Hospital Course (including significant findings, care, treatment, and services provided and events leading to death)  Martha Espinoza is a 55 y.o. year old female with COPD, bipolar, obesity who was admitted with acute hypoxic respiratory failure due to covid-19 intubated in ED 01-28-23. Her course was complicated by severe ARDS from covid-19 requiring pronation. Discussion had by Dr. Melvyn Novas with patient's family and they were aware at the time of her very tenuous condition, did not wish to 'prolong her care if reversibility unlikely.' On last day of her admission 10/24-10/25 she developed rapidly worsening oxygenation and profound multifactorial shock, found to have tension pneumothorax. Given her prolonged significant hypoxia and shock refractory to tube thoracotomy for pneumothorax, and without alternative intervention by which her hypoxia could clearly  be quickly reversed (bedside TTE in left lateral decubitus without RV pressure/volume overload) in conversation with the family it was agreed that we would prioritize her comfort as she neared death, she was made DNR with comfort measures only and she died February 02, 2023.     Pertinent Labs and Studies  Significant Diagnostic Studies CXR February 02, 2020 with small residual R pneumothorax, persistent diffuse alveolar opacities  Microbiology 01/10/20 sars cov2 positive 01/17/20 resp quant with PsA and Staph aureus 01/17/20 BCx with staph aureus  Lab Results for AMBREEN, TUFTE (MRN [id number]) as of 03/11/2020 20:11  Ref. Range 01/17/2020 04:32  COMPREHENSIVE METABOLIC PANEL Unknown Rpt (A)  Sodium Latest Ref Range: 135 - 145 mmol/L 145  Potassium Latest Ref Range: 3.5 - 5.1 mmol/L 3.0 (L)  Chloride Latest Ref Range: 98 - 111 mmol/L 101  CO2 Latest Ref Range: 22 - 32 mmol/L 33 (H)  Glucose Latest Ref Range: 70 - 99 mg/dL 142 (H)  BUN Latest Ref Range: 6 - 20 mg/dL 20  Creatinine Latest Ref Range: 0.44 - 1.00 mg/dL 0.63  Calcium Latest Ref Range: 8.9 - 10.3 mg/dL 8.0 (L)  Anion gap Latest Ref Range: 5 - 15  11  Magnesium Latest Ref Range: 1.7 - 2.4 mg/dL 1.9  Alkaline Phosphatase Latest Ref Range: 38 - 126 U/L 62  Albumin Latest Ref Range: 3.5 - 5.0 g/dL 2.4 (L)  AST Latest Ref Range: 15 - 41 U/L 28  ALT Latest Ref Range: 0 - 44 U/L 18  Total Protein Latest Ref Range: 6.5 - 8.1 g/dL 5.3 (L)  Total Bilirubin Latest Ref Range: 0.3 - 1.2 mg/dL 1.7 (H)  GFR, Estimated Latest Ref Range: >60 mL/min >60     Procedures/Operations  01/28/20 endotracheal intubation 01/15/20: left radial  arterial line 01/17/20: right pigtail chest tube placement 26-Jan-2020: right tube thoracostomy    Maryjane Hurter 03/11/2020, 7:49 PM
